# Patient Record
Sex: Female | Born: 1965
Health system: Southern US, Community
[De-identification: ages and names within clinical notes are randomized; demographics above are authoritative.]

## PROBLEM LIST (undated history)

## (undated) DIAGNOSIS — N189 Chronic kidney disease, unspecified: Secondary | ICD-10-CM

## (undated) DIAGNOSIS — R112 Nausea with vomiting, unspecified: Secondary | ICD-10-CM

## (undated) DIAGNOSIS — D649 Anemia, unspecified: Secondary | ICD-10-CM

## (undated) DIAGNOSIS — I272 Pulmonary hypertension, unspecified: Secondary | ICD-10-CM

## (undated) DIAGNOSIS — I10 Essential (primary) hypertension: Secondary | ICD-10-CM

## (undated) DIAGNOSIS — Z9889 Other specified postprocedural states: Secondary | ICD-10-CM

## (undated) DIAGNOSIS — F1020 Alcohol dependence, uncomplicated: Secondary | ICD-10-CM

## (undated) DIAGNOSIS — IMO0002 Reserved for concepts with insufficient information to code with codable children: Secondary | ICD-10-CM

## (undated) DIAGNOSIS — R7881 Bacteremia: Secondary | ICD-10-CM

## (undated) DIAGNOSIS — A419 Sepsis, unspecified organism: Secondary | ICD-10-CM

## (undated) DIAGNOSIS — E1165 Type 2 diabetes mellitus with hyperglycemia: Secondary | ICD-10-CM

## (undated) DIAGNOSIS — H548 Legal blindness, as defined in USA: Secondary | ICD-10-CM

## (undated) DIAGNOSIS — B952 Enterococcus as the cause of diseases classified elsewhere: Secondary | ICD-10-CM

## (undated) DIAGNOSIS — M726 Necrotizing fasciitis: Secondary | ICD-10-CM

## (undated) HISTORY — DX: Essential (primary) hypertension: I10

## (undated) HISTORY — PX: CATARACT EXTRACTION: SUR2

## (undated) HISTORY — PX: APPENDECTOMY: SHX54

## (undated) HISTORY — DX: Anemia, unspecified: D64.9

## (undated) HISTORY — DX: Enterococcus as the cause of diseases classified elsewhere: R78.81

## (undated) HISTORY — DX: Type 2 diabetes mellitus with hyperglycemia: E11.65

## (undated) HISTORY — DX: Alcohol dependence, uncomplicated: F10.20

## (undated) HISTORY — DX: Reserved for concepts with insufficient information to code with codable children: IMO0002

## (undated) HISTORY — DX: Sepsis, unspecified organism: A41.9

## (undated) HISTORY — DX: Enterococcus as the cause of diseases classified elsewhere: B95.2

## (undated) HISTORY — DX: Pulmonary hypertension, unspecified: I27.20

## (undated) HISTORY — PX: TUBAL LIGATION: SHX77

---

## 2004-01-10 ENCOUNTER — Emergency Department (HOSPITAL_COMMUNITY): Admission: EM | Admit: 2004-01-10 | Discharge: 2004-01-10 | Payer: Self-pay | Admitting: Emergency Medicine

## 2004-01-16 ENCOUNTER — Encounter: Admission: RE | Admit: 2004-01-16 | Discharge: 2004-01-16 | Payer: Self-pay | Admitting: Internal Medicine

## 2004-02-04 ENCOUNTER — Encounter: Admission: RE | Admit: 2004-02-04 | Discharge: 2004-02-04 | Payer: Self-pay | Admitting: Internal Medicine

## 2004-09-21 ENCOUNTER — Ambulatory Visit: Payer: Self-pay | Admitting: Internal Medicine

## 2005-01-19 ENCOUNTER — Emergency Department (HOSPITAL_COMMUNITY): Admission: EM | Admit: 2005-01-19 | Discharge: 2005-01-19 | Payer: Self-pay | Admitting: Emergency Medicine

## 2010-06-29 ENCOUNTER — Emergency Department (HOSPITAL_COMMUNITY): Admission: EM | Admit: 2010-06-29 | Discharge: 2010-06-29 | Payer: Self-pay | Admitting: Emergency Medicine

## 2010-06-29 ENCOUNTER — Emergency Department (HOSPITAL_COMMUNITY): Admission: EM | Admit: 2010-06-29 | Discharge: 2010-06-29 | Payer: Self-pay | Admitting: Family Medicine

## 2011-02-20 LAB — POCT I-STAT, CHEM 8
BUN: 7 mg/dL (ref 6–23)
BUN: 8 mg/dL (ref 6–23)
Calcium, Ion: 0.96 mmol/L — ABNORMAL LOW (ref 1.12–1.32)
Calcium, Ion: 1.16 mmol/L (ref 1.12–1.32)
Chloride: 101 mEq/L (ref 96–112)
Chloride: 105 mEq/L (ref 96–112)
Creatinine, Ser: 0.7 mg/dL (ref 0.4–1.2)
Creatinine, Ser: 0.7 mg/dL (ref 0.4–1.2)
Glucose, Bld: 337 mg/dL — ABNORMAL HIGH (ref 70–99)
Glucose, Bld: 460 mg/dL — ABNORMAL HIGH (ref 70–99)
HCT: 42 % (ref 36.0–46.0)
HCT: 43 % (ref 36.0–46.0)
Hemoglobin: 14.3 g/dL (ref 12.0–15.0)
Hemoglobin: 14.6 g/dL (ref 12.0–15.0)
Potassium: 4.2 mEq/L (ref 3.5–5.1)
Potassium: 4.6 mEq/L (ref 3.5–5.1)
Sodium: 136 mEq/L (ref 135–145)
Sodium: 137 mEq/L (ref 135–145)
TCO2: 24 mmol/L (ref 0–100)
TCO2: 26 mmol/L (ref 0–100)

## 2011-02-20 LAB — GLUCOSE, CAPILLARY
Glucose-Capillary: 232 mg/dL — ABNORMAL HIGH (ref 70–99)
Glucose-Capillary: 261 mg/dL — ABNORMAL HIGH (ref 70–99)
Glucose-Capillary: 265 mg/dL — ABNORMAL HIGH (ref 70–99)
Glucose-Capillary: 307 mg/dL — ABNORMAL HIGH (ref 70–99)
Glucose-Capillary: 394 mg/dL — ABNORMAL HIGH (ref 70–99)

## 2011-08-17 ENCOUNTER — Inpatient Hospital Stay (HOSPITAL_COMMUNITY)
Admission: EM | Admit: 2011-08-17 | Discharge: 2011-08-28 | DRG: 629 | Disposition: A | Discharge status: Home / Self Care | Payer: Self-pay | Attending: Orthopedic Surgery | Admitting: Orthopedic Surgery

## 2011-08-17 ENCOUNTER — Inpatient Hospital Stay (INDEPENDENT_AMBULATORY_CARE_PROVIDER_SITE_OTHER)
Admission: RE | Admit: 2011-08-17 | Discharge: 2011-08-17 | Disposition: A | Discharge status: Home / Self Care | Payer: Self-pay | Source: Ambulatory Visit | Attending: Family Medicine | Admitting: Family Medicine

## 2011-08-17 ENCOUNTER — Emergency Department (HOSPITAL_COMMUNITY): Payer: Self-pay

## 2011-08-17 DIAGNOSIS — D509 Iron deficiency anemia, unspecified: Secondary | ICD-10-CM | POA: Diagnosis present

## 2011-08-17 DIAGNOSIS — M9080 Osteopathy in diseases classified elsewhere, unspecified site: Secondary | ICD-10-CM | POA: Diagnosis present

## 2011-08-17 DIAGNOSIS — L608 Other nail disorders: Secondary | ICD-10-CM | POA: Diagnosis present

## 2011-08-17 DIAGNOSIS — S61209A Unspecified open wound of unspecified finger without damage to nail, initial encounter: Secondary | ICD-10-CM | POA: Diagnosis present

## 2011-08-17 DIAGNOSIS — M65849 Other synovitis and tenosynovitis, unspecified hand: Secondary | ICD-10-CM | POA: Diagnosis present

## 2011-08-17 DIAGNOSIS — F1020 Alcohol dependence, uncomplicated: Secondary | ICD-10-CM | POA: Diagnosis present

## 2011-08-17 DIAGNOSIS — E1165 Type 2 diabetes mellitus with hyperglycemia: Principal | ICD-10-CM | POA: Diagnosis present

## 2011-08-17 DIAGNOSIS — Z794 Long term (current) use of insulin: Secondary | ICD-10-CM

## 2011-08-17 DIAGNOSIS — IMO0002 Reserved for concepts with insufficient information to code with codable children: Principal | ICD-10-CM | POA: Diagnosis present

## 2011-08-17 DIAGNOSIS — L03119 Cellulitis of unspecified part of limb: Secondary | ICD-10-CM | POA: Diagnosis present

## 2011-08-17 DIAGNOSIS — N179 Acute kidney failure, unspecified: Secondary | ICD-10-CM | POA: Diagnosis not present

## 2011-08-17 DIAGNOSIS — B372 Candidiasis of skin and nail: Secondary | ICD-10-CM | POA: Diagnosis present

## 2011-08-17 DIAGNOSIS — M65839 Other synovitis and tenosynovitis, unspecified forearm: Secondary | ICD-10-CM | POA: Diagnosis present

## 2011-08-17 DIAGNOSIS — L02519 Cutaneous abscess of unspecified hand: Secondary | ICD-10-CM

## 2011-08-17 DIAGNOSIS — E871 Hypo-osmolality and hyponatremia: Secondary | ICD-10-CM | POA: Diagnosis present

## 2011-08-17 DIAGNOSIS — M869 Osteomyelitis, unspecified: Secondary | ICD-10-CM | POA: Diagnosis present

## 2011-08-17 DIAGNOSIS — T368X5A Adverse effect of other systemic antibiotics, initial encounter: Secondary | ICD-10-CM | POA: Diagnosis not present

## 2011-08-17 DIAGNOSIS — X58XXXA Exposure to other specified factors, initial encounter: Secondary | ICD-10-CM

## 2011-08-17 LAB — POCT I-STAT, CHEM 8
BUN: 4 mg/dL — ABNORMAL LOW (ref 6–23)
Calcium, Ion: 1.02 mmol/L — ABNORMAL LOW (ref 1.12–1.32)
Chloride: 94 mEq/L — ABNORMAL LOW (ref 96–112)
Creatinine, Ser: 0.8 mg/dL (ref 0.50–1.10)
Glucose, Bld: 449 mg/dL — ABNORMAL HIGH (ref 70–99)
HCT: 35 % — ABNORMAL LOW (ref 36.0–46.0)
Hemoglobin: 11.9 g/dL — ABNORMAL LOW (ref 12.0–15.0)
Potassium: 4.5 mEq/L (ref 3.5–5.1)
Sodium: 127 mEq/L — ABNORMAL LOW (ref 135–145)
TCO2: 19 mmol/L (ref 0–100)

## 2011-08-17 LAB — CBC
HCT: 29.1 % — ABNORMAL LOW (ref 36.0–46.0)
Hemoglobin: 9.5 g/dL — ABNORMAL LOW (ref 12.0–15.0)
MCH: 23.8 pg — ABNORMAL LOW (ref 26.0–34.0)
MCHC: 32.6 g/dL (ref 30.0–36.0)
MCV: 72.8 fL — ABNORMAL LOW (ref 78.0–100.0)
Platelets: 460 10*3/uL — ABNORMAL HIGH (ref 150–400)
RBC: 4 MIL/uL (ref 3.87–5.11)
RDW: 13.1 % (ref 11.5–15.5)
WBC: 17.5 10*3/uL — ABNORMAL HIGH (ref 4.0–10.5)

## 2011-08-17 LAB — DIFFERENTIAL
Basophils Absolute: 0 10*3/uL (ref 0.0–0.1)
Basophils Relative: 0 % (ref 0–1)
Eosinophils Absolute: 0.2 10*3/uL (ref 0.0–0.7)
Eosinophils Relative: 1 % (ref 0–5)
Lymphocytes Relative: 7 % — ABNORMAL LOW (ref 12–46)
Lymphs Abs: 1.2 10*3/uL (ref 0.7–4.0)
Monocytes Absolute: 1.2 10*3/uL — ABNORMAL HIGH (ref 0.1–1.0)
Monocytes Relative: 7 % (ref 3–12)
Neutro Abs: 14.9 10*3/uL — ABNORMAL HIGH (ref 1.7–7.7)
Neutrophils Relative %: 85 % — ABNORMAL HIGH (ref 43–77)

## 2011-08-17 LAB — GLUCOSE, CAPILLARY: Glucose-Capillary: 272 mg/dL — ABNORMAL HIGH (ref 70–99)

## 2011-08-18 ENCOUNTER — Inpatient Hospital Stay (HOSPITAL_COMMUNITY): Payer: Self-pay

## 2011-08-18 LAB — GLUCOSE, CAPILLARY
Glucose-Capillary: 221 mg/dL — ABNORMAL HIGH (ref 70–99)
Glucose-Capillary: 221 mg/dL — ABNORMAL HIGH (ref 70–99)
Glucose-Capillary: 267 mg/dL — ABNORMAL HIGH (ref 70–99)
Glucose-Capillary: 271 mg/dL — ABNORMAL HIGH (ref 70–99)
Glucose-Capillary: 331 mg/dL — ABNORMAL HIGH (ref 70–99)
Glucose-Capillary: 344 mg/dL — ABNORMAL HIGH (ref 70–99)
Glucose-Capillary: 350 mg/dL — ABNORMAL HIGH (ref 70–99)

## 2011-08-19 LAB — CBC
HCT: 26.8 % — ABNORMAL LOW (ref 36.0–46.0)
Hemoglobin: 8.5 g/dL — ABNORMAL LOW (ref 12.0–15.0)
MCH: 23.4 pg — ABNORMAL LOW (ref 26.0–34.0)
MCHC: 31.7 g/dL (ref 30.0–36.0)
MCV: 73.8 fL — ABNORMAL LOW (ref 78.0–100.0)
Platelets: 449 10*3/uL — ABNORMAL HIGH (ref 150–400)
RBC: 3.63 MIL/uL — ABNORMAL LOW (ref 3.87–5.11)
RDW: 13.4 % (ref 11.5–15.5)
WBC: 12.8 10*3/uL — ABNORMAL HIGH (ref 4.0–10.5)

## 2011-08-19 LAB — DIFFERENTIAL
Basophils Absolute: 0 10*3/uL (ref 0.0–0.1)
Basophils Relative: 0 % (ref 0–1)
Eosinophils Absolute: 0.1 10*3/uL (ref 0.0–0.7)
Eosinophils Relative: 1 % (ref 0–5)
Lymphocytes Relative: 7 % — ABNORMAL LOW (ref 12–46)
Lymphs Abs: 0.9 10*3/uL (ref 0.7–4.0)
Monocytes Absolute: 0.8 10*3/uL (ref 0.1–1.0)
Monocytes Relative: 6 % (ref 3–12)
Neutro Abs: 11 10*3/uL — ABNORMAL HIGH (ref 1.7–7.7)
Neutrophils Relative %: 86 % — ABNORMAL HIGH (ref 43–77)

## 2011-08-19 LAB — BASIC METABOLIC PANEL
BUN: 18 mg/dL (ref 6–23)
CO2: 23 mEq/L (ref 19–32)
Calcium: 8.3 mg/dL — ABNORMAL LOW (ref 8.4–10.5)
Chloride: 97 mEq/L (ref 96–112)
Creatinine, Ser: 2.81 mg/dL — ABNORMAL HIGH (ref 0.50–1.10)
GFR calc Af Amer: 22 mL/min — ABNORMAL LOW (ref >60)
GFR calc non Af Amer: 18 mL/min — ABNORMAL LOW (ref >60)
Glucose, Bld: 204 mg/dL — ABNORMAL HIGH (ref 70–99)
Potassium: 5.1 mEq/L (ref 3.5–5.1)
Sodium: 132 mEq/L — ABNORMAL LOW (ref 135–145)

## 2011-08-19 LAB — IRON AND TIBC
Iron: 11 ug/dL — ABNORMAL LOW (ref 42–135)
Saturation Ratios: 5 % — ABNORMAL LOW (ref 20–55)
TIBC: 228 ug/dL — ABNORMAL LOW (ref 250–470)
UIBC: 217 ug/dL (ref 125–400)

## 2011-08-19 LAB — GLUCOSE, CAPILLARY
Glucose-Capillary: 184 mg/dL — ABNORMAL HIGH (ref 70–99)
Glucose-Capillary: 199 mg/dL — ABNORMAL HIGH (ref 70–99)
Glucose-Capillary: 206 mg/dL — ABNORMAL HIGH (ref 70–99)
Glucose-Capillary: 208 mg/dL — ABNORMAL HIGH (ref 70–99)
Glucose-Capillary: 220 mg/dL — ABNORMAL HIGH (ref 70–99)
Glucose-Capillary: 318 mg/dL — ABNORMAL HIGH (ref 70–99)
Glucose-Capillary: 335 mg/dL — ABNORMAL HIGH (ref 70–99)

## 2011-08-19 LAB — HEMOGLOBIN A1C
Hgb A1c MFr Bld: 14 % — ABNORMAL HIGH (ref <5.7)
Hgb A1c MFr Bld: 13.9 % — ABNORMAL HIGH (ref <5.7)
Mean Plasma Glucose: 355 mg/dL — ABNORMAL HIGH (ref <117)
Mean Plasma Glucose: 352 mg/dL — ABNORMAL HIGH (ref <117)

## 2011-08-19 LAB — FOLATE: Folate: 10.2 ng/mL

## 2011-08-19 LAB — FERRITIN: Ferritin: 215 ng/mL (ref 10–291)

## 2011-08-19 LAB — VANCOMYCIN, TROUGH: Vancomycin Tr: 74.7 ug/mL (ref 10.0–20.0)

## 2011-08-19 LAB — SURGICAL PCR SCREEN
MRSA, PCR: NEGATIVE
Staphylococcus aureus: POSITIVE — AB

## 2011-08-19 LAB — VITAMIN B12: Vitamin B-12: 439 pg/mL (ref 211–911)

## 2011-08-20 ENCOUNTER — Inpatient Hospital Stay (HOSPITAL_COMMUNITY): Payer: Self-pay

## 2011-08-20 ENCOUNTER — Other Ambulatory Visit (HOSPITAL_COMMUNITY): Payer: Self-pay

## 2011-08-20 LAB — GLUCOSE, CAPILLARY
Glucose-Capillary: 107 mg/dL — ABNORMAL HIGH (ref 70–99)
Glucose-Capillary: 125 mg/dL — ABNORMAL HIGH (ref 70–99)
Glucose-Capillary: 144 mg/dL — ABNORMAL HIGH (ref 70–99)
Glucose-Capillary: 147 mg/dL — ABNORMAL HIGH (ref 70–99)
Glucose-Capillary: 157 mg/dL — ABNORMAL HIGH (ref 70–99)
Glucose-Capillary: 181 mg/dL — ABNORMAL HIGH (ref 70–99)

## 2011-08-20 LAB — BASIC METABOLIC PANEL
BUN: 24 mg/dL — ABNORMAL HIGH (ref 6–23)
CO2: 22 mEq/L (ref 19–32)
Calcium: 7.5 mg/dL — ABNORMAL LOW (ref 8.4–10.5)
Chloride: 98 mEq/L (ref 96–112)
Creatinine, Ser: 3.91 mg/dL — ABNORMAL HIGH (ref 0.50–1.10)
GFR calc Af Amer: 15 mL/min — ABNORMAL LOW (ref >60)
GFR calc non Af Amer: 13 mL/min — ABNORMAL LOW (ref >60)
Glucose, Bld: 143 mg/dL — ABNORMAL HIGH (ref 70–99)
Potassium: 5.2 mEq/L — ABNORMAL HIGH (ref 3.5–5.1)
Sodium: 133 mEq/L — ABNORMAL LOW (ref 135–145)

## 2011-08-20 LAB — DIFFERENTIAL
Basophils Absolute: 0 10*3/uL (ref 0.0–0.1)
Basophils Relative: 0 % (ref 0–1)
Eosinophils Absolute: 0.2 10*3/uL (ref 0.0–0.7)
Eosinophils Relative: 2 % (ref 0–5)
Lymphocytes Relative: 11 % — ABNORMAL LOW (ref 12–46)
Lymphs Abs: 1.3 10*3/uL (ref 0.7–4.0)
Monocytes Absolute: 1.1 10*3/uL — ABNORMAL HIGH (ref 0.1–1.0)
Monocytes Relative: 10 % (ref 3–12)
Neutro Abs: 8.7 10*3/uL — ABNORMAL HIGH (ref 1.7–7.7)
Neutrophils Relative %: 77 % (ref 43–77)

## 2011-08-20 LAB — CBC
HCT: 23.9 % — ABNORMAL LOW (ref 36.0–46.0)
Hemoglobin: 7.4 g/dL — ABNORMAL LOW (ref 12.0–15.0)
MCH: 22.8 pg — ABNORMAL LOW (ref 26.0–34.0)
MCHC: 31 g/dL (ref 30.0–36.0)
MCV: 73.8 fL — ABNORMAL LOW (ref 78.0–100.0)
Platelets: 401 10*3/uL — ABNORMAL HIGH (ref 150–400)
RBC: 3.24 MIL/uL — ABNORMAL LOW (ref 3.87–5.11)
RDW: 13.7 % (ref 11.5–15.5)
WBC: 11.3 10*3/uL — ABNORMAL HIGH (ref 4.0–10.5)

## 2011-08-20 LAB — WOUND CULTURE

## 2011-08-20 LAB — TSH: TSH: 2.863 u[IU]/mL (ref 0.350–4.500)

## 2011-08-20 LAB — ABO/RH: ABO/RH(D): O NEG

## 2011-08-21 LAB — DIFFERENTIAL
Basophils Absolute: 0.1 10*3/uL (ref 0.0–0.1)
Basophils Relative: 1 % (ref 0–1)
Eosinophils Absolute: 0.3 10*3/uL (ref 0.0–0.7)
Eosinophils Relative: 3 % (ref 0–5)
Lymphocytes Relative: 14 % (ref 12–46)
Lymphs Abs: 1.4 10*3/uL (ref 0.7–4.0)
Monocytes Absolute: 0.9 10*3/uL (ref 0.1–1.0)
Monocytes Relative: 9 % (ref 3–12)
Neutro Abs: 7 10*3/uL (ref 1.7–7.7)
Neutrophils Relative %: 73 % (ref 43–77)

## 2011-08-21 LAB — GLUCOSE, CAPILLARY
Glucose-Capillary: 105 mg/dL — ABNORMAL HIGH (ref 70–99)
Glucose-Capillary: 152 mg/dL — ABNORMAL HIGH (ref 70–99)
Glucose-Capillary: 159 mg/dL — ABNORMAL HIGH (ref 70–99)
Glucose-Capillary: 177 mg/dL — ABNORMAL HIGH (ref 70–99)
Glucose-Capillary: 94 mg/dL (ref 70–99)

## 2011-08-21 LAB — BASIC METABOLIC PANEL
BUN: 29 mg/dL — ABNORMAL HIGH (ref 6–23)
CO2: 23 mEq/L (ref 19–32)
Calcium: 7.5 mg/dL — ABNORMAL LOW (ref 8.4–10.5)
Chloride: 100 mEq/L (ref 96–112)
Creatinine, Ser: 4.79 mg/dL — ABNORMAL HIGH (ref 0.50–1.10)
GFR calc Af Amer: 12 mL/min — ABNORMAL LOW (ref >60)
GFR calc non Af Amer: 10 mL/min — ABNORMAL LOW (ref >60)
Glucose, Bld: 86 mg/dL (ref 70–99)
Potassium: 4.5 mEq/L (ref 3.5–5.1)
Sodium: 135 mEq/L (ref 135–145)

## 2011-08-21 LAB — VANCOMYCIN, TROUGH: Vancomycin Tr: 48.6 ug/mL (ref 10.0–20.0)

## 2011-08-21 LAB — CBC
HCT: 26.1 % — ABNORMAL LOW (ref 36.0–46.0)
Hemoglobin: 8.4 g/dL — ABNORMAL LOW (ref 12.0–15.0)
MCH: 24 pg — ABNORMAL LOW (ref 26.0–34.0)
MCHC: 32.2 g/dL (ref 30.0–36.0)
MCV: 74.6 fL — ABNORMAL LOW (ref 78.0–100.0)
Platelets: 420 10*3/uL — ABNORMAL HIGH (ref 150–400)
RBC: 3.5 MIL/uL — ABNORMAL LOW (ref 3.87–5.11)
RDW: 14.3 % (ref 11.5–15.5)
WBC: 9.7 10*3/uL (ref 4.0–10.5)

## 2011-08-21 LAB — TISSUE CULTURE

## 2011-08-21 LAB — CROSSMATCH
ABO/RH(D): O NEG
Antibody Screen: NEGATIVE
Unit division: 0
Weak D: NEGATIVE

## 2011-08-22 LAB — GLUCOSE, CAPILLARY
Glucose-Capillary: 142 mg/dL — ABNORMAL HIGH (ref 70–99)
Glucose-Capillary: 124 mg/dL — ABNORMAL HIGH (ref 70–99)
Glucose-Capillary: 175 mg/dL — ABNORMAL HIGH (ref 70–99)
Glucose-Capillary: 175 mg/dL — ABNORMAL HIGH (ref 70–99)
Glucose-Capillary: 159 mg/dL — ABNORMAL HIGH (ref 70–99)

## 2011-08-22 LAB — BASIC METABOLIC PANEL
BUN: 32 mg/dL — ABNORMAL HIGH (ref 6–23)
CO2: 21 mEq/L (ref 19–32)
Calcium: 7.3 mg/dL — ABNORMAL LOW (ref 8.4–10.5)
Chloride: 99 mEq/L (ref 96–112)
Creatinine, Ser: 4.89 mg/dL — ABNORMAL HIGH (ref 0.50–1.10)
GFR calc Af Amer: 12 mL/min — ABNORMAL LOW (ref >60)
GFR calc non Af Amer: 10 mL/min — ABNORMAL LOW (ref >60)
Glucose, Bld: 115 mg/dL — ABNORMAL HIGH (ref 70–99)
Potassium: 4.4 mEq/L (ref 3.5–5.1)
Sodium: 132 mEq/L — ABNORMAL LOW (ref 135–145)

## 2011-08-22 LAB — ANAEROBIC CULTURE

## 2011-08-22 LAB — CBC
HCT: 28.1 % — ABNORMAL LOW (ref 36.0–46.0)
Hemoglobin: 8.9 g/dL — ABNORMAL LOW (ref 12.0–15.0)
MCH: 23.7 pg — ABNORMAL LOW (ref 26.0–34.0)
MCHC: 31.7 g/dL (ref 30.0–36.0)
MCV: 74.7 fL — ABNORMAL LOW (ref 78.0–100.0)
Platelets: 447 10*3/uL — ABNORMAL HIGH (ref 150–400)
RBC: 3.76 MIL/uL — ABNORMAL LOW (ref 3.87–5.11)
RDW: 14.4 % (ref 11.5–15.5)
WBC: 10.2 10*3/uL (ref 4.0–10.5)

## 2011-08-22 LAB — DIFFERENTIAL
Basophils Absolute: 0 10*3/uL (ref 0.0–0.1)
Basophils Relative: 0 % (ref 0–1)
Eosinophils Absolute: 0.3 10*3/uL (ref 0.0–0.7)
Eosinophils Relative: 3 % (ref 0–5)
Lymphocytes Relative: 13 % (ref 12–46)
Lymphs Abs: 1.3 10*3/uL (ref 0.7–4.0)
Monocytes Absolute: 1 10*3/uL (ref 0.1–1.0)
Monocytes Relative: 10 % (ref 3–12)
Neutro Abs: 7.6 10*3/uL (ref 1.7–7.7)
Neutrophils Relative %: 74 % (ref 43–77)

## 2011-08-22 LAB — PROTIME-INR
INR: 1.1 (ref 0.00–1.49)
Prothrombin Time: 14.4 seconds (ref 11.6–15.2)

## 2011-08-22 LAB — HEPATITIS B SURFACE ANTIGEN: Hepatitis B Surface Ag: NEGATIVE

## 2011-08-23 LAB — CBC
HCT: 27.6 % — ABNORMAL LOW (ref 36.0–46.0)
Hemoglobin: 8.7 g/dL — ABNORMAL LOW (ref 12.0–15.0)
MCH: 23.6 pg — ABNORMAL LOW (ref 26.0–34.0)
MCHC: 31.5 g/dL (ref 30.0–36.0)
MCV: 75 fL — ABNORMAL LOW (ref 78.0–100.0)
Platelets: 473 10*3/uL — ABNORMAL HIGH (ref 150–400)
RBC: 3.68 MIL/uL — ABNORMAL LOW (ref 3.87–5.11)
RDW: 14.6 % (ref 11.5–15.5)
WBC: 9.9 10*3/uL (ref 4.0–10.5)

## 2011-08-23 LAB — BASIC METABOLIC PANEL
BUN: 33 mg/dL — ABNORMAL HIGH (ref 6–23)
CO2: 19 mEq/L (ref 19–32)
Calcium: 7 mg/dL — ABNORMAL LOW (ref 8.4–10.5)
Chloride: 101 mEq/L (ref 96–112)
Creatinine, Ser: 4.83 mg/dL — ABNORMAL HIGH (ref 0.50–1.10)
GFR calc Af Amer: 12 mL/min — ABNORMAL LOW (ref >60)
GFR calc non Af Amer: 10 mL/min — ABNORMAL LOW (ref >60)
Glucose, Bld: 108 mg/dL — ABNORMAL HIGH (ref 70–99)
Potassium: 5 mEq/L (ref 3.5–5.1)
Sodium: 133 mEq/L — ABNORMAL LOW (ref 135–145)

## 2011-08-23 LAB — DIFFERENTIAL
Basophils Absolute: 0.1 10*3/uL (ref 0.0–0.1)
Basophils Relative: 1 % (ref 0–1)
Eosinophils Absolute: 0.3 10*3/uL (ref 0.0–0.7)
Eosinophils Relative: 3 % (ref 0–5)
Lymphocytes Relative: 12 % (ref 12–46)
Lymphs Abs: 1.2 10*3/uL (ref 0.7–4.0)
Monocytes Absolute: 1 10*3/uL (ref 0.1–1.0)
Monocytes Relative: 10 % (ref 3–12)
Neutro Abs: 7.3 10*3/uL (ref 1.7–7.7)
Neutrophils Relative %: 74 % (ref 43–77)

## 2011-08-23 LAB — GLUCOSE, CAPILLARY
Glucose-Capillary: 100 mg/dL — ABNORMAL HIGH (ref 70–99)
Glucose-Capillary: 124 mg/dL — ABNORMAL HIGH (ref 70–99)
Glucose-Capillary: 152 mg/dL — ABNORMAL HIGH (ref 70–99)
Glucose-Capillary: 161 mg/dL — ABNORMAL HIGH (ref 70–99)
Glucose-Capillary: 170 mg/dL — ABNORMAL HIGH (ref 70–99)

## 2011-08-23 NOTE — Consult Note (Signed)
[name], DIAB NO.:  [id number]  MEDICAL RECORD NO.:  [id number]  LOCATION:  [id number]                         FACILITY:  [facility]  PHYSICIAN:  Sol Blazing, M.D.DATE OF BIRTH:  1966/10/04  DATE OF CONSULTATION:  08/21/2011 DATE OF DISCHARGE:                                CONSULTATION   REQUESTING PHYSICIAN:  Satira Anis. Amedeo Plenty, MD  REASON FOR CONSULT:  Acute renal failure.  HISTORY:  The patient is a 45 year old African American female with a 5- year history diabetes who presented with a 2-week history of worsening hand and finger infection on the right hand, also to a lesser extent of the left hand.  She was admitted on August 17, 2011, by Dr. Amedeo Plenty who did an I and D of a right mid-palmar abscess and amputation right index finger which was grossly infected throughout.  She also had a partial distal amputation of the left third finger which was also affected. Postop, she has been stable clinically.  She is a heavy drinker, half a case of beer reportedly a day per family, but has not shown a lot of signs of withdrawal.  She has developed acute renal failure with a steadily rising creatinine over the last 3 days, with associated high vancomycin levels.  Creatinine on admission was 0.8.  On August 19, 2011, it was 2.8, up to 3.9 yesterday on August 20, 2011, and 4.7 today on August 21, 2011.  She is making urine and denies any urinary complaints.  She was initially treated with vancomycin and Zosyn from admission up to August 19, 2011, and vancomycin was discontinued when the elevated creatinine was noticed. Vancomycin level on August 19, 2011, was 64 and on August 20, 2011, down to 48.  She was switched yesterday to Keflex and doxycycline p.o. and the Zosyn has been stopped.  She denies any history of renal failure or kidney disease in the family.  PAST MEDICAL HISTORY: 1. Diabetes mellitus type 2. 2. C-section. 3. Alcohol  abuse.  PAST SURGICAL HISTORY:  C-section.  MEDICATIONS:  She is on vitamins, sliding scale insulin, ferrous sulfate, doxycycline 100 mg b.i.d., cephalexin 500 mg q.8 and p.r.n. medications.  ALLERGIES:  None.  SOCIAL HISTORY:  The patient is single.  She has 3 children, she has one at home, the youngest is 42.  No tobacco or drug use.  Alcohol as above.  FAMILY HISTORY:  Father is alive with diabetes.  Mother died at age 57, she had diabetes.  REVIEW OF SYSTEMS:  Denies any active fever, chills, sweats, sore throat, difficulty swallowing, chest pain, productive cough, shortness of breath, abdominal pain, nausea, vomiting, or diarrhea.  Appetite is decent and improving.  No difficulty voiding.  She does not have a Foley catheter in place.  Denies any dysuria or change in urine color.  No joint pain.  No history of arthritis.  No focal numbness or weakness. No history of heart disease.  No history of stroke or seizure.  Denies skin rash or itching.  PHYSICAL EXAMINATION:  VITAL SIGNS:  Blood pressure 130/70, temp 98, pulse 80, respirations 18, and O2 sat 92% on room air. GENERAL:  The patient is  a well-nourished appearing, moderately adult female in no distress.  She is sitting up in bed. SKIN:  Warm and dry.  She has dark discoloration of multiple areas on her lower extremities without any abscess formation. HEENT:  PERRLA, EOMI.  Throat is clear and moist. NECK:  Supple with flat neck veins.  No JVD or bruits. CHEST:  Clear throughout.  No rales, rhonchi, or wheezing. CARDIAC:  Regular rate and rhythm with quiet precordium.  No murmur, rub, or gallop.  No heaves or lifts. ABDOMEN:  Moderately obese, soft, nontender, active bowel sounds.  Well- healed suprapubic scar from previous C-section.  No ascites. RECTAL:  Deferred. GU:  Deferred. EXTREMITIES:  No joint effusion or deformity.  Both hands are wrapped up in bandage with bandages.  There is no other edema in the upper or  lower extremities.  No calf cords or tenderness.  Pulses in the feet are diminished. NEUROLOGIC:  Awake, alert, and oriented x3.  No focal deficits.  LABORATORY DATA:  Sodium 135, potassium 4, BUN 29, creatinine 4.79, and CO2 of 23.  Hemoglobin 8.4, white blood count 9000, and platelets 420. Wound cultures was positive for group B strep on August 17, 2011. Blood cultures were negative x2.  Vancomycin level is elevated as above. Urinary sediment showed numerous granular casts, 5-10 WBC's and RBC's, but no cellular casts. Lots of squamous epithelial cells.  IMPRESSION: 1. Acute tubular necrosis due to vancomycin toxicity, nonoliguric.     The patient is euvolemic.  Electrolytes are normal at this time.     Recommend conservative management at this time.  No need for acute     dialysis.  We will start IV fluids and hopefully she     will recover soon.  If necessary, temporary dialysis will     be done.  Vancomycin has already been stopped.  In addition, we     would measure urine sodium, send another urinalysis.       Stop Robaxin which is contraindicated in advanced     renal failure and avoid any other nephrotoxins, ACE inhibitors,     etc. 2. Right hand infection, status post I$D and finger amputations. 3. Diabetes mellitus type 2, poorly controlled, long term. 4. Alcohol abuse.  RECOMMENDATIONS:  See orders.     Sol Blazing, M.D.     RDS/MEDQ  D:  08/21/2011  T:  08/21/2011  Job:  [id number]  Electronically Signed by Roney Jaffe M.D. on 08/23/2011 07:48:39 AM

## 2011-08-23 NOTE — Consult Note (Signed)
[name], [name] NO.:  [id number]  MEDICAL RECORD NO.:  [id number]  LOCATION:  [id number]                         FACILITY:  [facility]  PHYSICIAN:  Loma Boston, DO      DATE OF BIRTH:  Oct 31, 1966  DATE OF CONSULTATION: DATE OF DISCHARGE:                                CONSULTATION   REQUESTING PHYSICIAN:  Satira Anis. Amedeo Plenty, MD of [facility].  REASON FOR CONSULTATION:  Management of hypertension, diabetes, alcohol withdrawal, and hyponatremia.  HISTORY OF PRESENT ILLNESS:  [name] is a 45 year old African American female with a history of diabetes and hypertension.  She presented to the emergency department with a 3-4 month history of right finger infections that have become uncontrolled.  The patient went to the operating room for incision and drainage and debridement of the wounds and ended up with a guillotine amputation of the right index finger by Dr. Amedeo Plenty.  In the emergency department, the patient was noted to have glucose of 449 as well as blood pressures have been elevated.  The patient had metformin in the past, but has not been on this for quite sometime due to difficult financial position.  She also stated that she was prescribed hydrochlorothiazide at some point, but never took the medication.  The patient has carried the diagnosis of diabetes for past 4-5 years.  She admits to blurred vision, polyuria, polydipsia.  She denies headaches, fevers, shaking chills, nausea, vomiting.  The patient does have multiple wounds on the lower extremities as well as fungal toenail infections.  The patient denies decreased sensation in the lower extremities bilaterally.  PAST MEDICAL HISTORY: 1. Diabetes. 2. Hypertension.  MEDICATIONS:  None.  SURGERIES:  Status post right index finger guillotine amputation due to infection.  ALLERGIES:  No known drug allergies.  FAMILY HISTORY:  Significant for hypertension, hypercholesterolemia, diabetes type  2.  SOCIAL HISTORY:  The patient lives with her boyfriend and works at [employer]. She reports taking 40 ounces beer a day, though per the patient's family this has been minimized, and the family reports that the patient drinks a 12-pack a night.  The patient denies tobacco or illicit drug use.  PHYSICAL EXAMINATION:  VITAL SIGNS:  The patient is afebrile with a heart rate of 103, blood pressure is 153/75, oxygen saturation is 99% on room air with a respiratory rate of 21. GENERAL:  This is a middle-aged [place] American female who is awake, alert, and oriented x3. HEENT:  Head normocephalic, atraumatic.  Pupils equal, round, reactive to light.  Extraocular muscles are intact.  Sclerae are anicteric, noninjected. NECK:  Supple without lymphadenopathy.  Oropharynx is moist. LUNGS:  Clear to auscultation bilaterally.  No wheezes, rales, or rhonchi. CARDIAC:  Regular rate with normal S1 and S2 sounds.  No murmurs auscultated. ABDOMEN:  Soft, nontender, nondistended with appropriate bowel sounds. SKIN:  There is multiple superficial wounds on her lower extremities bilaterally that have been treated with topical antiseptic.  The patient has a dressing on the right upper extremity. VASCULAR:  2+ dorsalis pedis and radial pulses.  There is no edema present. NEURO:  Strength is 5/5 in lower extremities bilaterally.  There is no  focal deficit observed.  LABORATORY DATA: 1. An i-STAT was drawn, which showed a sodium of 127, potassium of     4.5, chloride of 94, BUN of 4, creatinine of 0.8.  The patient's     glucose was 449.  After surgery, the patient's blood sugar was in     the low 220s. 2. CBC was drawn, which showed a white count of 17.5.  Hemoglobin was     9.5, hematocrit was 29.1, and platelets of 460, and percent     neutrophil of 85 and ANC 14.9. 3. Chest x-ray showed no evidence of focal consolidation, pleural     effusion, or pneumothorax.  ASSESSMENT AND PLAN: 1. Right hand  infection with status post infection drainage and     guillotine amputation of the right index finger.  The patient is     being admitted to a medicine and surgery floor.  I agree with the     orthopedic team's choice of vancomycin and Zosyn as empiric     coverage.  Wound care and management of this will be per     Orthopedics. 2. Diabetes type 2, uncontrolled.  I recommend checking a hemoglobin     A1c as well as obtaining a TSH.  I have recommended starting the     patient on insulin of 20 units at bedtime as well as Humalog 4     units a.c. and a sliding scale.  I had also recommended checking     the patient's CBGs every 4 hours and then before meals.  She is     also recommended placing the patient on carbohydrate modified diet     endorsing aide in the blood sugar management. 3. Hypertension.  I would recommend starting hydrochlorothiazide 25 mg     daily.  Should this prove insufficient for management of the     patient's hypertension, another agent, such as lisinopril can be     added. 4. Hyponatremia.  I would recommend continuing the patient's IV fluids     of normal saline at 125 mL per hour.  I would recommend rechecking     the patient's basic metabolic panel in the morning to assure that     her electrolytes are normalizing. 5. Alcohol dependence.  I would recommend treating the patient with     the withdrawal protocol starting with thiamine and monitoring     withdrawal symptoms.  Thank you for allowing [place] to participate in the management of this patient's care.  Greater than 50 minutes was spent in preparation of this consult.          ______________________________ Loma Boston, DO     JS/MEDQ  D:  08/17/2011  T:  08/18/2011  Job:  [id number]  Electronically Signed by Loma Boston DO on 08/23/2011 06:36:03 PM

## 2011-08-24 LAB — DIFFERENTIAL
Basophils Absolute: 0 10*3/uL (ref 0.0–0.1)
Basophils Relative: 0 % (ref 0–1)
Eosinophils Absolute: 0.2 10*3/uL (ref 0.0–0.7)
Eosinophils Relative: 3 % (ref 0–5)
Lymphocytes Relative: 11 % — ABNORMAL LOW (ref 12–46)
Lymphs Abs: 1.1 10*3/uL (ref 0.7–4.0)
Monocytes Absolute: 0.8 10*3/uL (ref 0.1–1.0)
Monocytes Relative: 8 % (ref 3–12)
Neutro Abs: 7.5 10*3/uL (ref 1.7–7.7)
Neutrophils Relative %: 78 % — ABNORMAL HIGH (ref 43–77)

## 2011-08-24 LAB — COMPREHENSIVE METABOLIC PANEL
ALT: 5 U/L (ref 0–35)
AST: 17 U/L (ref 0–37)
Albumin: 2 g/dL — ABNORMAL LOW (ref 3.5–5.2)
Alkaline Phosphatase: 100 U/L (ref 39–117)
BUN: 33 mg/dL — ABNORMAL HIGH (ref 6–23)
CO2: 17 mEq/L — ABNORMAL LOW (ref 19–32)
Calcium: 7.7 mg/dL — ABNORMAL LOW (ref 8.4–10.5)
Chloride: 103 mEq/L (ref 96–112)
Creatinine, Ser: 4.8 mg/dL — ABNORMAL HIGH (ref 0.50–1.10)
GFR calc Af Amer: 12 mL/min — ABNORMAL LOW (ref >60)
GFR calc non Af Amer: 10 mL/min — ABNORMAL LOW (ref >60)
Glucose, Bld: 142 mg/dL — ABNORMAL HIGH (ref 70–99)
Potassium: 5 mEq/L (ref 3.5–5.1)
Sodium: 137 mEq/L (ref 135–145)
Total Bilirubin: 0.2 mg/dL — ABNORMAL LOW (ref 0.3–1.2)
Total Protein: 7 g/dL (ref 6.0–8.3)

## 2011-08-24 LAB — CBC
HCT: 29.8 % — ABNORMAL LOW (ref 36.0–46.0)
Hemoglobin: 9.3 g/dL — ABNORMAL LOW (ref 12.0–15.0)
MCH: 23.6 pg — ABNORMAL LOW (ref 26.0–34.0)
MCHC: 31.2 g/dL (ref 30.0–36.0)
MCV: 75.6 fL — ABNORMAL LOW (ref 78.0–100.0)
Platelets: 533 10*3/uL — ABNORMAL HIGH (ref 150–400)
RBC: 3.94 MIL/uL (ref 3.87–5.11)
RDW: 14.9 % (ref 11.5–15.5)
WBC: 9.6 10*3/uL (ref 4.0–10.5)

## 2011-08-24 LAB — CULTURE, BLOOD (ROUTINE X 2)
Culture  Setup Time: 201209120206
Culture  Setup Time: 201209120207
Culture: NO GROWTH
Culture: NO GROWTH

## 2011-08-24 LAB — IRON: Iron: 485 ug/dL — ABNORMAL HIGH (ref 42–135)

## 2011-08-24 LAB — GLUCOSE, CAPILLARY: Glucose-Capillary: 177 mg/dL — ABNORMAL HIGH (ref 70–99)

## 2011-08-25 LAB — GLUCOSE, CAPILLARY
Glucose-Capillary: 119 mg/dL — ABNORMAL HIGH (ref 70–99)
Glucose-Capillary: 132 mg/dL — ABNORMAL HIGH (ref 70–99)
Glucose-Capillary: 136 mg/dL — ABNORMAL HIGH (ref 70–99)
Glucose-Capillary: 139 mg/dL — ABNORMAL HIGH (ref 70–99)
Glucose-Capillary: 145 mg/dL — ABNORMAL HIGH (ref 70–99)
Glucose-Capillary: 151 mg/dL — ABNORMAL HIGH (ref 70–99)
Glucose-Capillary: 177 mg/dL — ABNORMAL HIGH (ref 70–99)
Glucose-Capillary: 188 mg/dL — ABNORMAL HIGH (ref 70–99)
Glucose-Capillary: 201 mg/dL — ABNORMAL HIGH (ref 70–99)
Glucose-Capillary: 209 mg/dL — ABNORMAL HIGH (ref 70–99)
Glucose-Capillary: 75 mg/dL (ref 70–99)
Glucose-Capillary: 94 mg/dL (ref 70–99)

## 2011-08-25 LAB — RENAL FUNCTION PANEL
Albumin: 2.1 g/dL — ABNORMAL LOW (ref 3.5–5.2)
BUN: 32 mg/dL — ABNORMAL HIGH (ref 6–23)
CO2: 22 mEq/L (ref 19–32)
Calcium: 7.9 mg/dL — ABNORMAL LOW (ref 8.4–10.5)
Chloride: 103 mEq/L (ref 96–112)
Creatinine, Ser: 4.95 mg/dL — ABNORMAL HIGH (ref 0.50–1.10)
GFR calc Af Amer: 12 mL/min — ABNORMAL LOW (ref >60)
GFR calc non Af Amer: 10 mL/min — ABNORMAL LOW (ref >60)
Glucose, Bld: 172 mg/dL — ABNORMAL HIGH (ref 70–99)
Phosphorus: 5 mg/dL — ABNORMAL HIGH (ref 2.3–4.6)
Potassium: 4.8 mEq/L (ref 3.5–5.1)
Sodium: 138 mEq/L (ref 135–145)

## 2011-08-25 LAB — DIFFERENTIAL
Basophils Absolute: 0.1 10*3/uL (ref 0.0–0.1)
Basophils Relative: 1 % (ref 0–1)
Eosinophils Absolute: 0.3 10*3/uL (ref 0.0–0.7)
Eosinophils Relative: 3 % (ref 0–5)
Lymphocytes Relative: 11 % — ABNORMAL LOW (ref 12–46)
Lymphs Abs: 1.1 10*3/uL (ref 0.7–4.0)
Monocytes Absolute: 1 10*3/uL (ref 0.1–1.0)
Monocytes Relative: 9 % (ref 3–12)
Neutro Abs: 7.8 10*3/uL — ABNORMAL HIGH (ref 1.7–7.7)
Neutrophils Relative %: 76 % (ref 43–77)

## 2011-08-25 LAB — CBC
HCT: 28.8 % — ABNORMAL LOW (ref 36.0–46.0)
Hemoglobin: 9.1 g/dL — ABNORMAL LOW (ref 12.0–15.0)
MCH: 23.6 pg — ABNORMAL LOW (ref 26.0–34.0)
MCHC: 31.6 g/dL (ref 30.0–36.0)
MCV: 74.6 fL — ABNORMAL LOW (ref 78.0–100.0)
Platelets: 524 10*3/uL — ABNORMAL HIGH (ref 150–400)
RBC: 3.86 MIL/uL — ABNORMAL LOW (ref 3.87–5.11)
RDW: 15.1 % (ref 11.5–15.5)
WBC: 10.2 10*3/uL (ref 4.0–10.5)

## 2011-08-26 LAB — GLUCOSE, CAPILLARY
Glucose-Capillary: 203 mg/dL — ABNORMAL HIGH (ref 70–99)
Glucose-Capillary: 178 mg/dL — ABNORMAL HIGH (ref 70–99)
Glucose-Capillary: 146 mg/dL — ABNORMAL HIGH (ref 70–99)
Glucose-Capillary: 121 mg/dL — ABNORMAL HIGH (ref 70–99)
Glucose-Capillary: 94 mg/dL (ref 70–99)
Glucose-Capillary: 105 mg/dL — ABNORMAL HIGH (ref 70–99)

## 2011-08-26 LAB — DIFFERENTIAL
Basophils Absolute: 0.1 10*3/uL (ref 0.0–0.1)
Basophils Relative: 1 % (ref 0–1)
Eosinophils Absolute: 0.3 10*3/uL (ref 0.0–0.7)
Eosinophils Relative: 3 % (ref 0–5)
Lymphocytes Relative: 18 % (ref 12–46)
Lymphs Abs: 1.9 10*3/uL (ref 0.7–4.0)
Monocytes Absolute: 0.9 10*3/uL (ref 0.1–1.0)
Monocytes Relative: 9 % (ref 3–12)
Neutro Abs: 7.1 10*3/uL (ref 1.7–7.7)
Neutrophils Relative %: 69 % (ref 43–77)

## 2011-08-26 LAB — BASIC METABOLIC PANEL
BUN: 30 mg/dL — ABNORMAL HIGH (ref 6–23)
CO2: 24 mEq/L (ref 19–32)
Calcium: 8.2 mg/dL — ABNORMAL LOW (ref 8.4–10.5)
Chloride: 103 mEq/L (ref 96–112)
Creatinine, Ser: 4.45 mg/dL — ABNORMAL HIGH (ref 0.50–1.10)
GFR calc Af Amer: 13 mL/min — ABNORMAL LOW (ref >60)
GFR calc non Af Amer: 11 mL/min — ABNORMAL LOW (ref >60)
Glucose, Bld: 165 mg/dL — ABNORMAL HIGH (ref 70–99)
Potassium: 4.9 mEq/L (ref 3.5–5.1)
Sodium: 137 mEq/L (ref 135–145)

## 2011-08-26 LAB — CBC
HCT: 27.9 % — ABNORMAL LOW (ref 36.0–46.0)
Hemoglobin: 8.9 g/dL — ABNORMAL LOW (ref 12.0–15.0)
MCH: 23.9 pg — ABNORMAL LOW (ref 26.0–34.0)
MCHC: 31.9 g/dL (ref 30.0–36.0)
MCV: 74.8 fL — ABNORMAL LOW (ref 78.0–100.0)
Platelets: 527 10*3/uL — ABNORMAL HIGH (ref 150–400)
RBC: 3.73 MIL/uL — ABNORMAL LOW (ref 3.87–5.11)
RDW: 15.5 % (ref 11.5–15.5)
WBC: 10.2 10*3/uL (ref 4.0–10.5)

## 2011-08-26 LAB — VANCOMYCIN, RANDOM: Vancomycin Rm: 23.7 ug/mL

## 2011-08-27 LAB — RENAL FUNCTION PANEL
Albumin: 2.1 g/dL — ABNORMAL LOW (ref 3.5–5.2)
BUN: 29 mg/dL — ABNORMAL HIGH (ref 6–23)
CO2: 25 mEq/L (ref 19–32)
Calcium: 8.5 mg/dL (ref 8.4–10.5)
Chloride: 104 mEq/L (ref 96–112)
Creatinine, Ser: 4.61 mg/dL — ABNORMAL HIGH (ref 0.50–1.10)
GFR calc Af Amer: 13 mL/min — ABNORMAL LOW (ref >60)
GFR calc non Af Amer: 10 mL/min — ABNORMAL LOW (ref >60)
Glucose, Bld: 148 mg/dL — ABNORMAL HIGH (ref 70–99)
Phosphorus: 4.4 mg/dL (ref 2.3–4.6)
Potassium: 4.4 mEq/L (ref 3.5–5.1)
Sodium: 139 mEq/L (ref 135–145)

## 2011-08-27 LAB — CBC
HCT: 27.5 % — ABNORMAL LOW (ref 36.0–46.0)
Hemoglobin: 8.6 g/dL — ABNORMAL LOW (ref 12.0–15.0)
MCH: 23.6 pg — ABNORMAL LOW (ref 26.0–34.0)
MCHC: 31.3 g/dL (ref 30.0–36.0)
MCV: 75.3 fL — ABNORMAL LOW (ref 78.0–100.0)
Platelets: 522 10*3/uL — ABNORMAL HIGH (ref 150–400)
RBC: 3.65 MIL/uL — ABNORMAL LOW (ref 3.87–5.11)
RDW: 15.6 % — ABNORMAL HIGH (ref 11.5–15.5)
WBC: 9.6 10*3/uL (ref 4.0–10.5)

## 2011-08-27 LAB — DIFFERENTIAL
Basophils Absolute: 0.1 10*3/uL (ref 0.0–0.1)
Basophils Relative: 1 % (ref 0–1)
Eosinophils Absolute: 0.3 10*3/uL (ref 0.0–0.7)
Eosinophils Relative: 3 % (ref 0–5)
Lymphocytes Relative: 16 % (ref 12–46)
Lymphs Abs: 1.5 10*3/uL (ref 0.7–4.0)
Monocytes Absolute: 0.8 10*3/uL (ref 0.1–1.0)
Monocytes Relative: 9 % (ref 3–12)
Neutro Abs: 6.9 10*3/uL (ref 1.7–7.7)
Neutrophils Relative %: 72 % (ref 43–77)

## 2011-08-27 LAB — GLUCOSE, CAPILLARY
Glucose-Capillary: 115 mg/dL — ABNORMAL HIGH (ref 70–99)
Glucose-Capillary: 149 mg/dL — ABNORMAL HIGH (ref 70–99)
Glucose-Capillary: 162 mg/dL — ABNORMAL HIGH (ref 70–99)
Glucose-Capillary: 169 mg/dL — ABNORMAL HIGH (ref 70–99)
Glucose-Capillary: 178 mg/dL — ABNORMAL HIGH (ref 70–99)

## 2011-08-28 LAB — CBC
HCT: 27.2 % — ABNORMAL LOW (ref 36.0–46.0)
Hemoglobin: 8.7 g/dL — ABNORMAL LOW (ref 12.0–15.0)
MCH: 24.1 pg — ABNORMAL LOW (ref 26.0–34.0)
MCHC: 32 g/dL (ref 30.0–36.0)
MCV: 75.3 fL — ABNORMAL LOW (ref 78.0–100.0)
Platelets: 530 10*3/uL — ABNORMAL HIGH (ref 150–400)
RBC: 3.61 MIL/uL — ABNORMAL LOW (ref 3.87–5.11)
RDW: 15.9 % — ABNORMAL HIGH (ref 11.5–15.5)
WBC: 10.4 10*3/uL (ref 4.0–10.5)

## 2011-08-28 LAB — BASIC METABOLIC PANEL
BUN: 28 mg/dL — ABNORMAL HIGH (ref 6–23)
CO2: 27 mEq/L (ref 19–32)
Calcium: 8.9 mg/dL (ref 8.4–10.5)
Chloride: 103 mEq/L (ref 96–112)
Creatinine, Ser: 4.39 mg/dL — ABNORMAL HIGH (ref 0.50–1.10)
GFR calc Af Amer: 13 mL/min — ABNORMAL LOW (ref >60)
GFR calc non Af Amer: 11 mL/min — ABNORMAL LOW (ref >60)
Glucose, Bld: 183 mg/dL — ABNORMAL HIGH (ref 70–99)
Potassium: 4.6 mEq/L (ref 3.5–5.1)
Sodium: 140 mEq/L (ref 135–145)

## 2011-08-28 LAB — DIFFERENTIAL
Basophils Absolute: 0.1 10*3/uL (ref 0.0–0.1)
Basophils Relative: 1 % (ref 0–1)
Eosinophils Absolute: 0.3 10*3/uL (ref 0.0–0.7)
Eosinophils Relative: 3 % (ref 0–5)
Lymphocytes Relative: 24 % (ref 12–46)
Lymphs Abs: 2.5 10*3/uL (ref 0.7–4.0)
Monocytes Absolute: 0.9 10*3/uL (ref 0.1–1.0)
Monocytes Relative: 9 % (ref 3–12)
Neutro Abs: 6.6 10*3/uL (ref 1.7–7.7)
Neutrophils Relative %: 64 % (ref 43–77)

## 2011-08-28 LAB — GLUCOSE, CAPILLARY: Glucose-Capillary: 235 mg/dL — ABNORMAL HIGH (ref 70–99)

## 2011-08-28 NOTE — Op Note (Signed)
[name], [name] NO.:  [id number]  MEDICAL RECORD NO.:  [id number]  LOCATION:  5035                         FACILITY:  [facility]  PHYSICIAN:  Satira Anis. Bradshaw Minihan, M.D.DATE OF BIRTH:  03/28/1966  DATE OF PROCEDURE:  08/19/2011 DATE OF DISCHARGE:                              OPERATIVE REPORT   PREOPERATIVE DIAGNOSES: 1. Abscess, right hand status post index finger guillotine amputation. 2. Left middle finger osteomyelitis.  POSTOPERATIVE DIAGNOSES: 1. Abscess, right hand status post index finger guillotine amputation. 2. Left middle finger osteomyelitis.  PROCEDURE: 1. Irrigation and debridement excisional in nature of skin,     subcutaneous tissue, muscle, tendon, and bone, right hand. 2. Revision amputation, right ray.  This was a ray amputation revision     with loose primary closure. 3. Left middle finger irrigation and debridement of skin,     subcutaneous, and bone, excisional in nature. 4. Left middle finger distal interphalangeal joint amputation with     bilateral neurectomy.  SURGEON:  Satira Anis. Amedeo Plenty, MD  ASSISTANT:  Avelina Laine, PA-C  COMPLICATIONS:  None.  ANESTHESIA:  General.  TOURNIQUET TIME:  Less than 20 minutes on the left middle finger.  ESTIMATED BLOOD LOSS:  Minimal.  INDICATIONS FOR PROCEDURE:  This is a 45 year old female who presents with the above-mentioned diagnosis.  I have counseled her in regards to risks and benefits of surgery including risk of infection, bleeding, anesthesia, damage to normal structures, failure of surgery to accomplish its intended goals of relieving symptoms and restoring function.  With this in mind, she desires to proceed.  All questions have been encouraged and answered preoperatively.  OPERATIVE PROCEDURE:  The patient was seen by myself and Anesthesia, taken to operative suite, underwent smooth induction of general anesthesia.  Time-out called.  She was prepped and draped in the  usual sterile fashion about both upper extremities.  This was a 10-minute surgical Betadine scrub followed by painting.  Once this was done, I and D of skin, subcutaneous tissue, bone, muscle, and tendon was accomplished about the right hand.  The tissue looked very good.  I was pleased with this.  I resected portions of the skin and subcutaneous tissue aggressively and following this, I performed additional irrigation with 3 liters of saline.  Three liters of saline were placed through-and-through.  Following excisional debridement, attention was turned towards revision ray amputation.  The patient had portions of soft tissue removed.  Radial digital artery to the middle finger was kept intact and following this, we performed a revision amputation to the index finger ray.  Loose closure with drains was accomplished with combination of chromic and Prolene suture.  Following this, the patient underwent I and D of skin, subcutaneous tissue as well as bone and tendon about the left middle finger.  She had devitalized tissue and some areas of purulence and certain osteomyelitis about the distal phalanx.  Given these findings, we aggressively performed I and D of skin, subcutaneous tissue, bone, and tendon.  Three liters of saline were placed to the left middle finger.  Following this, I performed a revision amputation with bilateral neurectomy about the left middle finger at the DIP joint.  This was DIP joint level amputation.  Following this, the wound was closed with chromic and Prolene of the 5-0 variety.  Following this, both extremities were dressed sterilely with Adaptic, Xeroform gauze, and sterile dressing.  Following this, she was taken to recovery room.  After extubation will continue Zosyn and vancomycin.  Her Gram stain showed gram-positive cocci, but we did not have definitive aureus growing out on culture yet and thus we were awaiting final cultures.  She has a  hemoglobin A1c of 14.  She takes very poor care for herself and despite having told me initially she does not drink.  Her family noted that she drank more than half case of beer a day.  We will monitor her condition closely.  We are trying to get her to take better care for herself.  Case management consult and Internal Medicine are on board to help [place].  Fortunately, her wounds looked better and her legs looked better.  We will go ahead and place some nystatin powder on her abdominal skin folds, continue aggressive wound care, and treatment to the lower extremities with Neosporin b.i.d. to be applied.  Should any worsening occur, we will intervene aggressively.  However hopefully, this will be her last formal surgical procedure.  I do think that it is going to be critical for her to engage her upper extremity care.  Without engaging the care plan, I think that her extremities will be doomed to the same type of fate given her multiple medical problems.     Satira Anis. Amedeo Plenty, M.D.     [facility]  D:  08/19/2011  T:  08/20/2011  Job:  [id number]  Electronically Signed by Roseanne Kaufman M.D. on 08/28/2011 01:34:01 PM

## 2011-08-28 NOTE — Op Note (Signed)
[name], [name] NO.:  [id number]  MEDICAL RECORD NO.:  [id number]  LOCATION:  5035                         FACILITY:  [facility]  PHYSICIAN:  Satira Anis. Linnie Delgrande, M.D.DATE OF BIRTH:  [birth year]  DATE OF PROCEDURE:  08/17/2011 DATE OF DISCHARGE:                              OPERATIVE REPORT   PREOPERATIVE DIAGNOSES: 1. Poorly controlled diabetes. 2. Right hand deep abscess with ascending lymphangitis cellulitis and     purulent flexor tenosynovitis rather marked extensive and chronic. 3. Left middle finger chronic open wound. 4. Poor bilateral lower extremity skin and feet condition with a     hypertrophic nail deformity.  POSTOPERATIVE DIAGNOSES: 1. Poorly controlled diabetes. 2. Right hand deep abscess with ascending lymphangitis cellulitis and     purulent flexor tenosynovitis rather marked extensive and chronic. 3. Left middle finger chronic open wound. 4. Poor bilateral lower extremity skin and feet condition with a     hypertrophic nail deformity with noted osteomyelitis about the     middle finger, left hand distal phalanx and the right index finger     with advanced purulent tenosynovitis and complete destruction of     the soft tissues from the proximal phalanx distally.  SURGICAL PROCEDURE PERFORMED.: 1. Irrigation and debridement of right hand skin, subcutaneous tissue,     muscle, tendon, and bone. 2. Open guillotine amputation, right index finger secondary to a     necrotic tissue. 3. Decompression, mid palmar space abscess, right hand. 4. Irrigation and debridement of left middle finger skin, subcutaneous     tissue, bone secondary osteomyelitic involvement. 5. Irrigation and debridement, skin and subcutaneous less than 20 cm2,     right leg. 6. Irrigation and debridement, skin, subcutaneous tissue left leg less     than 20 cm2. 7. Toenail trimming, right and left feet extensive in nature secondary     to poor hypertrophic nail  deformity.  SURGEON:  Satira Anis. Amedeo Plenty, MD  ASSISTANT:  Avelina Laine, PA-C  COMPLICATIONS:  None.  ANESTHESIA:  General.  TOURNIQUET TIME:  Less than an hour.  INDICATIONS FOR PROCEDURE:  Ms. Zieger is a very poorly controlled diabetic.  She had an infection of her hand over 2 weeks, did not seek medical attention.  She states she has tried to just ignore.  She has a whole bone infection in her hand.  She has a white count of 17.5.  She is febrile.  She has ascending cellulitis and looks in horrible condition.  In addition to this, she has a left middle finger with osteomyelitic opened sore.  She states she typically bites her fingers due to sores that she obtains in these areas.  She also has poor condition of her feet and legs.  She has hypertrophic toenail deformity and open sores on her legs.  She also has what appears to be a rash under her skin folds about her abdominal region.  She states she has not taken a shower in a month.  She states she lives in a rental house and works at [employer] on [employer].  She states she usually washer off, but does not take an aggressive shower.  She  notes no locking, popping, numbness, or tingling.  She notes no inciting event other than a slow indolent infectious process, which she has ignored.  She is an insulin-dependent diabetic.  She is not taking any medicines for this it appears.  Her blood sugar is in the 400s today.  I have counseled her that we need to desperately go and try to treat all these areas to the best of our abilities.  I have counseled her in regard to risks and benefits of surgery.  She desires to proceed.  OPERATION IN DETAIL:  The patient was seen by myself and Anesthesia, taken to the operating suite, and underwent smooth induction of general anesthesia,  laid spine, appropriately padded, prepped and draped in usual sterile fashion with Betadine scrub and paint about the right upper extremity.  Once this  was done, I performed I and D of skin, subcutaneous tissue, muscle, tendon, and bone.  She had gross purulent tissue.  Aerobic and anaerobic cultures were taken as well as tissue cultures.  At this time, I then sequentially removed necrotic tissue with the tourniquet deflated.  The tourniquet was actually not inflated during the procedure, and I simply cut back until I was able to gain some semblance of blood flow.  She had a purulent flexor tenosynovitis.  She underwent a open guillotine amputation about her right index finger. She had no meaningful tissue that was viable.  This included bone, tendon.  Both the DIP, PIP, and MCP joints were full pus and in addition to this, the flexor tendon sheath was full pus.  Once we sequentially resected her, I then amputated her open guillotine at the MCP joint.  I retracted the tendons and pulled distally, and was able to get the tendons severed.  Proximal to this, the tendons looked clean.  Following this, I performed a crushing cauterization technique to digital nerves.  I did not encourage upon the middle finger digital nerve (that is the radial digital nerve to the middle finger).  Following this, I performed I and D of midpalmar space abscess.  The patient had decompression of the midpalmar space, deep abscess performed without difficulty.  Thus, I and D, flexor tenosynovectomy, and open guillotine amputation with I and D of the midpalmar space abscess in the right hand was accomplished.  I placed greater than 3 liters through the area in terms of lavage and following this, packed this open.  Certainly, there was our hope not to have to amputate the finger, but there is absolutely no viable tissue and what I would describe is one of the most horrid fingers that has been neglected for quite some time. Given her white count, blood sugars, and overall medical habitus and condition, my fear is sepsis.  I feel that loss of the finger is  better than loss of hand or arm, and complete sepsis.  The patient understood this as I have discussed this with her preop.  After being addressed, the patient then had attention turned towards the middle finger.  I and D of skin, subcutaneous tissue, and bone was accomplished.  The patient tolerated this well and no complicating features.  Once this was complete, I performed very careful and cautious look at the middle finger, left hand.  She had an open area of osteomyelitic focus.  This was rongeured down and skin edges were trimmed, also take off the nail here.  Thus, the left middle finger I and D of osteomyelitic focus and resection of portions  of distal phalanx was accomplished.  We will watch this closely and consider DIP for my amputation as needed.  Following this, I then performed I and D of the right leg skin, subcutaneous tissue, underwent I and D less than 20 cm2 of poorly kept tissue.  Following this, I performed I and D of skin, subcutaneous tissue, left lower extremity of poorly kept tissue.  Following this, I performed right and left feet treatment with removal of hypertrophic nail deformity.  The patient tolerated this well.  There were no complicating features.  I did not cause any break in the skin or bleeding.  I was very careful with all handling.  I felt that it would be absolutely necessary for the patient to have an aggressive look and I and D performed.  Unfortunately, I feel that the patient has poor motivation and poor skill set that is why asked Medicine the consult.  We will ask Social Services to consult.  I have discussed her findings at length and the do's and don'ts, etc.     Satira Anis. Amedeo Plenty, M.D.     [facility]  D:  08/17/2011  T:  08/18/2011  Job:  [id number]  Electronically Signed by Roseanne Kaufman M.D. on 08/28/2011 01:33:53 PM

## 2011-08-28 NOTE — Discharge Summary (Signed)
NAMELENCY, BLANKENBILLER NO.:  [id number]  MEDICAL RECORD NO.:  [id number]  LOCATION:  5035                         FACILITY:  [facility]  PHYSICIAN:  Satira Anis. Rhyanna Sorce, M.D.DATE OF BIRTH:  [birth year]  DATE OF ADMISSION:  08/17/2011 DATE OF DISCHARGE:                              DISCHARGE SUMMARY   [name] was admitted to the hospital with notable findings of abscess in her right hand as well as left middle finger osteomyelitis. I was asked to see and treat her on the date of admission August 17, 2011.  I saw the patient, immediately booked her for surgery and performed a thorough history and physical.  She was admitted with noted uncontrolled diabetes, hypertension and alcoholism.  This was in addition to the neglected wounds in her hand as well as her neglected lower extremity features and abnormal nail growth as well as fungal infection about skin folds in abdominal region.  She was admitted, underwent surgery on the night of admission in the form of guillotine amputation to the right index finger and left middle finger I and D. She subsequently was taken back to surgery for repeat I and D on August 19, 2011.  I should note that we did address her lower extremities with a care plan which involved trimming of her nails and I and D of the lower extremities at the initial operation.  At the time of her second operation, she looked well with loosely closed wounds over drains.  We consulted Pharmacy for vancomycin and Zosyn.  It was noted that her random vancomycin levels were quite high and ultimately her creatinine and BUN increased.  At the time of her initial admission, I consulted Medicine in regards to her multiple medical problems including diabetes, hypertension and alcoholism.  She did not go through any heavy alcoholic withdrawal throughout the course of her hospitalization.  Her diabetes was managed with a sliding scale insulin and ultimately  with insulin recommendations as noted in the DC note.  The patient matriculated through her course of care quite well actually with minimal pain complaints, but was noted to have acute renal failure secondary to acute tubular necrosis secondary to vancomycin toxicity.  This was managed by Medicine initially and they ultimately asked Renal to get involved.  Her levels were monitored closely and ultimately started trending down to a point where they felt it was safe to simply observe this in an outpatient setting.  Dr. Hassell Done made final recommendations.  Dr. Hassell Done felt that she should be monitored as an outpatient with a weekly BMET.  Dr. Hassell Done did not recommend renal follow-up, but recommended that a primary care physician to see the patient.  He spoke to Dr. Lane Hacker who will plan to see the patient next week.  He stated that she should have follow-up arrangements made, but not make them himself.  The patient also has an appointment with [facility] on September 20, 2011, for general medical care which is already been set.  Nevertheless, he felt that the creatinine should be monitored by Dr. Hilma Favors sooner and as this was set into affect by the hospitalist.  I should note that during her hospitalization  her wounds were checked daily.  We performed I and Ds at bedside and dressing changes. Ultimately, at the time of her discharge, her wounds are without signs of discharge or infection.  The left middle finger is clean, dry and intact.  The right middle finger is normal.  The right index finger has undergone ray amputation and has a suture line which is clean, dry and I will continue to monitor this in my office.  At present time, her lower extremities were much improved.  Her feet are clean, dry without signs of infection.  We have recommended lotion, cleansing, and daily washing.  At the time of her discharge, both Renal and Internal Medicine have seen the patient and felt she was  stable.  The patient's anemia was stable. Her alcohol dependency was very stable as she had been abstinent over a week and expressed no desire to return to using alcohol.  Her diabetes was under reasonably good control and she was fit for an outpatient program which was instituted.  She is going to be monitored with home health and have followup.  I should note at the time of her discharge she had a chest exam which showed no wheezing, shortness of breath, and clear lung fields.  Heart was regular rate without murmur.  Abdomen was nontender, nondistended, soft.  Lower extremity examination showed no signs of infection, DVT, neurovascular compromise.  Right and left upper extremities were stable at the suture lines where the amputations have been performed.  She was stable and anxious to be discharged.  Dr. Algis Liming made final recommendations in regards to her medicines.  FINAL DIAGNOSES: 1. Insulin dependent diabetes. 2. Hypertension. 3. Anemia secondary to acute blood loss. 4. Alcohol dependency stable with abstinence exhibited throughout her     hospitalization and no signs of deep venous thrombosis. 5. Right and left hand infection, requiring ray amputation of the     index finger right hand and middle finger DIP joint amputation. 6. Acute renal failure secondary to acute tubular necrosis secondary     to vancomycin toxicity.  RECOMMENDATIONS:  The patient will be discharged home on medicines including Keflex 500 mg 1 p.o. q.i.d. x14 days, doxycycline 100 mg one p.o. b.i.d. x10 days, insulin 70/30 to be taken twice daily with meals and subcutaneous route 15 units, ferrous sulfate 325 mg one p.o. b.i.d., folic acid 1 mg one p.o. daily, thiamine 100 mg one p.o. daily dispensed #30.  She is also going to take amlodipine 10 mg one p.o. daily. Prescriptions have been written.  The patient will followup in my office this Friday for wound check.  She is going to followup with Dr.  Hilma Favors of the Medicine Teaching Service as outlined in her chart.  She will have labs and call to arrange for September 24 appointment.  She will also see Dr. Maryan Rued at [facility] for diabetes recommendations and followup on Monday September 20, 2011, at 2:45 p.m.[name]  She is to go to the health department on Tuesday, Wednesday, or Thursday before 8:00 a.m. or go to [facility] Fridays only before 8:00 a.m. and Dr. Hilma Favors will need to be called for an appointment at the first of the week.  I have discussed these issues with her length of these notes etc.  She appears to have a good understanding of her care plan.  I feel the biggest obstacle to her recovery is herself and her motivation.  We have gone over these issues in detail.  I have grind upon her the necessity of engaging in her own care if she is going to have any chance of a meaningful healthy life into the future.  I appreciate the consultants to help in this very complex/arduous patient.     Satira Anis. Amedeo Plenty, M.D.     [facility]  D:  08/28/2011  T:  08/28/2011  Job:  [id number]  cc:   Dr. Maryan Rued Dr. Hilma Favors  Electronically Signed by Roseanne Kaufman M.D. on 08/28/2011 01:34:08 PM

## 2011-08-28 NOTE — Op Note (Signed)
  NAMERAYEN, SAUSEDO NO.:  [id number]  MEDICAL RECORD NO.:  [id number]  LOCATION:                                 FACILITY:  PHYSICIAN:  Satira Anis. Gaylynn Seiple, M.D.DATE OF BIRTH:  [birth year]  DATE OF PROCEDURE: DATE OF DISCHARGE:                              OPERATIVE REPORT   I had the pleasure to see [name] at bedside.  She had undergone two previous I and Ds.  She is doing very well in regards to the hand and middle finger.  She is making progress.  The patient is scheduled for discharge tomorrow.  Today,  we performed the procedure at bedside. We performed I and D of skin and subcutaneous tissue less than 20 sq cm. This was done with combination scissor, knife, and orthopedic curette. She tolerated this well.  She has no significant erythema and swelling and this decreased nicely.  We irrigated the wound copiously and then instructed her on dressing changes.  She should be ready to go home tomorrow.  She has had increase in her kidney functions, which she can be monitored by Dr. Hilma Favors as an outpatient.  Both Renal and Medicine have been overseeing her renal, heart, diabetes, and hypertension issues.  At the present time, we will plan discharge tomorrow.  Follow up carefully in my office.  With we have instructed [name] on dressing changes and other issues and her discharge note will be dictated tomorrow of course.  This was an I and D at bedside and she tolerated this well.     Satira Anis. Amedeo Plenty, M.D.     [facility]  D:  08/27/2011  T:  08/28/2011  Job:  [id number]  Electronically Signed by Roseanne Kaufman M.D. on 08/28/2011 01:34:10 PM

## 2011-08-28 NOTE — Op Note (Signed)
  [name], [name] NO.:  [id number]  MEDICAL RECORD NO.:  [id number]  LOCATION:  5035                         FACILITY:  [place] [facility]  PHYSICIAN:  Satira Anis. Yazmen Briones, M.D.DATE OF BIRTH:  05-Feb-1966  DATE OF PROCEDURE: DATE OF DISCHARGE:                              OPERATIVE REPORT   ADDENDUM:  [name] had 10 toes addressed with surgical removal of portions of her nail.  This was an I and D of nail about 5 toes on the right and 5 toes on the left.  This should be noted for her record and operative notes.  This operative note is August 16, 2010.  This is an addendum.     Satira Anis. Amedeo Plenty, M.D.     [facility]  D:  08/26/2011  T:  08/26/2011  Job:  [id number]  Electronically Signed by Roseanne Kaufman M.D. on 08/28/2011 01:34:05 PM

## 2011-08-31 ENCOUNTER — Other Ambulatory Visit: Payer: Self-pay | Admitting: Internal Medicine

## 2011-08-31 MED ORDER — LANCETS MISC: Status: DC

## 2011-08-31 MED ORDER — GLUCOSE BLOOD VI STRP: ORAL_STRIP | Status: DC

## 2011-09-01 ENCOUNTER — Encounter: Payer: Self-pay | Admitting: Internal Medicine

## 2011-09-01 ENCOUNTER — Ambulatory Visit (INDEPENDENT_AMBULATORY_CARE_PROVIDER_SITE_OTHER): Payer: Self-pay | Admitting: Internal Medicine

## 2011-09-01 VITALS — BP 181/106 | HR 101 | Temp 98.5°F | Ht 67.0 in | Wt 185.4 lb

## 2011-09-01 DIAGNOSIS — E119 Type 2 diabetes mellitus without complications: Secondary | ICD-10-CM | POA: Insufficient documentation

## 2011-09-01 DIAGNOSIS — I10 Essential (primary) hypertension: Secondary | ICD-10-CM | POA: Insufficient documentation

## 2011-09-01 DIAGNOSIS — IMO0002 Reserved for concepts with insufficient information to code with codable children: Secondary | ICD-10-CM

## 2011-09-01 DIAGNOSIS — I159 Secondary hypertension, unspecified: Secondary | ICD-10-CM | POA: Insufficient documentation

## 2011-09-01 DIAGNOSIS — N179 Acute kidney failure, unspecified: Secondary | ICD-10-CM

## 2011-09-01 DIAGNOSIS — D649 Anemia, unspecified: Secondary | ICD-10-CM | POA: Insufficient documentation

## 2011-09-01 DIAGNOSIS — F1020 Alcohol dependence, uncomplicated: Secondary | ICD-10-CM | POA: Insufficient documentation

## 2011-09-01 DIAGNOSIS — E1165 Type 2 diabetes mellitus with hyperglycemia: Secondary | ICD-10-CM

## 2011-09-01 DIAGNOSIS — IMO0001 Reserved for inherently not codable concepts without codable children: Secondary | ICD-10-CM

## 2011-09-01 MED ORDER — LANCETS MISC: Status: DC

## 2011-09-01 MED ORDER — AMLODIPINE BESYLATE 10 MG PO TABS: 10.0000 mg | ORAL_TABLET | Freq: Every day | ORAL | Status: DC

## 2011-09-01 MED ORDER — GLUCOSE BLOOD VI STRP: ORAL_STRIP | Status: DC

## 2011-09-01 MED ORDER — INSULIN NPH ISOPHANE & REGULAR (70-30) 100 UNIT/ML ~~LOC~~ SUSP: 18.0000 [IU] | Freq: Two times a day (BID) | SUBCUTANEOUS | Status: DC

## 2011-09-01 MED ORDER — "INSULIN SYRINGE-NEEDLE U-100 31G X 5/16"" 0.3 ML MISC": Status: DC

## 2011-09-01 MED ORDER — INSULIN ASPART PROT & ASPART (70-30 MIX) 100 UNIT/ML ~~LOC~~ SUSP: 18.0000 [IU] | Freq: Two times a day (BID) | SUBCUTANEOUS | Status: DC

## 2011-09-01 NOTE — Progress Notes (Signed)
Subjective:    Patient ID: [name], female    DOB: [birth year], 45 y.o.   MRN: [id number]  HPI Pt is a 45 y.o. female who  has a past medical history of Diabetes mellitus type 2, uncontrolled; Hypertension; Anemia; and Alcohol dependency. and presents to clinic today for the following:     1) Hospital follow-up - Patient was hospitalized at [facility] from September 11 - 22, 2012  for evaluation of right first digit abscess and was found to have left middle finger OM, for which Dr. Amedeo Plenty, of orthopedic surgery performed amputation of the respective fingers on 08/18/2011, with subsewuent repeat I&D on the 13th. She was initially treated with IV Abx and then transitioned to oral medications, with dc on Keflex and Doxycycline, which she states she has been taking regularly. She will follow up with Dr. Amedeo Plenty on Friday, Sept 28, 2012.   She was also found uncontrolled diabetes, hypertension, and known hx of alcohol abuse for which Internal medicine service was consulted. A1c was found to be 13.9. Was not taking her insulin prior to hospitalization, and therefore was resumed on insulin therapy and transitioned to 70/30 secondary to lack of insurance, and financial concerns. She did not have DTs 2/2 to her alcohol abuse, and seemed to have minimal withdrawal symptoms per discharge summary.  Lastly, during hospital course, she was found to be in ARF thought 2/2 to ATN due to Vanc toxicity, renal was consulted. She did not require HD. She is recommended to continue weekly BMETs, which we are helping to facilitate until her appt with [facility] on 10/15.   Since hospital discharge, patient does note significant improvement of symptoms. is taking recommended medications regularly, although she admits to having run out of her Norvasc yesterday. Denies fever, chills, drainage from wound sites, areas of redness of skin, nausea, vomiting, diarrhea, abdominal pain. States she is voiding, stooling, eating, drinking  well and without difficulty.  2) DMII -  Lab Results  Component Value Date   HGBA1C 13.9* 08/19/2011   Patient checking blood sugars 2 times daily, before breakfast and before dinner. Reports fasting blood sugars of 200-300s mg/dL preprandial to breakfast and 200s before dinner. Currently taking insulin 70/30, 15units BID. 0 hypoglycemic episodes since last visit. denies polyuria, polydipsia, nausea, vomiting, diarrhea.  does request refills today - needs refills of test strips, insulin.   3) HTN - Patient does not check blood pressure regularly at home. Currently taking amlodipine (Norvase), although has not taken today. denies headaches, dizziness, lightheadedness, chest pain, shortness of breath.  does request refills today.    Review of Systems Per HPI.  Current Outpatient Medications Medication Sig  . amLODipine (NORVASC) 10 MG tablet Take 1 tablet (10 mg total) by mouth daily.  . cephALEXin (KEFLEX) 500 MG capsule Take 500 mg by mouth 4 (four) times daily.    [name] doxycycline (ADOXA) 100 MG tablet Take 100 mg by mouth 2 (two) times daily.    . ferrous sulfate 325 (65 FE) MG tablet Take 325 mg by mouth 2 (two) times daily with a meal.    . folic acid (FOLVITE) 1 MG tablet Take 1 mg by mouth daily.    . insulin NPH-insulin regular (NOVOLIN 70/30) (70-30) 100 UNIT/ML injection Inject 15 Units into the skin 2 (two) times daily with a meal.  . thiamine 100 MG tablet Take 100 mg by mouth daily.      Allergies Review of patient's allergies indicates no known allergies.  Past Medical  History  Diagnosis Date  . Diabetes mellitus type 2, uncontrolled   . Hypertension   . Anemia   . Alcohol dependency     Past Surgical History  Procedure Date  . Amputation     right first and left middle finger amputation 2/2 OM 08/2011 by Dr. Amedeo Plenty  . Cesarean section        Objective:   Physical Exam   Filed Vitals:   09/01/11 1328  BP: 181/106  Pulse: 101  Temp: 98.5 F (36.9 C)       General: Vital signs reviewed and noted. Well-developed, well-nourished, in no acute distress; alert, appropriate and cooperative throughout examination.  Head: Normocephalic, atraumatic.  Lungs:  Normal respiratory effort. Clear to auscultation BL without crackles or wheezes.  Heart: RRR. S1 and S2 normal without gallop, rubs. (+) murmur.  Abdomen:  BS normoactive. Soft, Nondistended, non-tender.  No masses or organomegaly.  Extremities: No pretibial edema. Right and left digits bandaged, clean, dry intact bandaging. No warmth, erythema of surrounding skin.       Assessment & Plan:  Case and plan of care discussed with Dr. Bertha Stakes.

## 2011-09-01 NOTE — Assessment & Plan Note (Signed)
Lab Results  Component Value Date   HGBA1C 13.9* 08/19/2011   HGBA1C 14.0* 08/18/2011   Lab Results  Component Value Date   CREATININE 4.39* 08/28/2011    Assessment:  Disease Control: not controlled  Progress toward goals: unchanged  Barriers to meeting goals: lack of insurance   Plan: Glucometer log was not reviewed today, as pt did not have glucometer available for review.  Diabetes treatment:    - increase 70/30 insulin to 18 units twice daily. - reminded to bring blood glucose meter & log to each visit and reminded to bring medications to each visit - refilled insulin and test strips

## 2011-09-01 NOTE — Assessment & Plan Note (Signed)
BP Readings from Last 3 Encounters:  09/01/11 0000000    Basic Metabolic Panel:    Component Value Date/Time   NA 140 08/28/2011 0610   K 4.6 08/28/2011 0610   CL 103 08/28/2011 0610   CO2 27 08/28/2011 0610   BUN 28* 08/28/2011 0610   CREATININE 4.39* 08/28/2011 0610   GLUCOSE 183* 08/28/2011 0610   CALCIUM 8.9 08/28/2011 0610    Assessment: Hypertension Control: not controlled  Progress toward goals: deteriorated  Barriers to meeting goals: did not take medication today.   Plan: Hypertension treatment:   - continue current medications - recommended to continue medications regularly, will need to be followed up.

## 2011-09-01 NOTE — Patient Instructions (Addendum)
   Please follow-up with [facility] on September 20, 2011 at your already scheduled visit, at which time they will need to reevaluate your diabetes, blood pressure, and kidney function.  Diabetes -   Keep up your good work with checking blood sugars twice daily  Increase your insulin to 18 units twice daily, 30 minutes before meals.   If you start getting blood sugars less than 70, go back to the 15 units and call your doctor.  Please bring your meter with you for review at [facility]  Blood pressure -  Please continue to take your medications regularly and without missing doses.  Finger amputations - please follow-up with Dr. Amedeo Plenty at your schedule visit on Friday.  Please bring all of your medications in a bag to your next visit.

## 2011-09-01 NOTE — Assessment & Plan Note (Signed)
The patient has been abstinent from alcohol since hospital discharge. - Recommended to continue her valiant efforts towards alcohol cessation.

## 2011-09-01 NOTE — Assessment & Plan Note (Addendum)
Due to ATN from vanc toxicity in hospital course. [facility]) was reviewed today, showing improving renal function with cr of 3.77. - Will require repeat BMET and follow-up visit with [facility] on Oct 15.

## 2011-09-02 LAB — GLUCOSE, CAPILLARY: Glucose-Capillary: 262 mg/dL — ABNORMAL HIGH (ref 70–99)

## 2011-09-02 NOTE — Progress Notes (Signed)
Office notes and info faxed to [facility] per Dr Guy Sandifer 09/02/11 3:45PM. Hilda Blades Sky Primo RN 09/02/11 3:45PM

## 2011-09-03 ENCOUNTER — Telehealth: Payer: Self-pay | Admitting: *Deleted

## 2011-09-03 NOTE — Telephone Encounter (Signed)
Pt calls and states she needs some insulin and needles, she uses novolin 70/30, will check w/ kayeg. For sample.

## 2011-09-03 NOTE — Telephone Encounter (Signed)
We do not have any Novolog 70/30 but we do have Lantus. She can not get any medicine until her appointment at [facility] on 09/20/11. Therefore I will temporarily switch her to Lantus 30u in the evening and we will give her a sample of Lantus and needles to last until her [facility] appointment.

## 2011-09-07 ENCOUNTER — Telehealth: Payer: Self-pay | Admitting: *Deleted

## 2011-09-07 NOTE — Telephone Encounter (Signed)
[facility] calls and states pt cbg is reading HI x 2 days, asymptomatic, appt scheduled for 10/3 at 1345, unable to reach pt at this time, [facility] informed and lm for pt to call will call in am.

## 2011-09-08 ENCOUNTER — Encounter: Payer: Self-pay | Admitting: Internal Medicine

## 2011-09-08 ENCOUNTER — Ambulatory Visit (INDEPENDENT_AMBULATORY_CARE_PROVIDER_SITE_OTHER): Payer: Self-pay | Admitting: Internal Medicine

## 2011-09-08 VITALS — BP 145/89 | HR 94 | Temp 98.6°F | Wt 179.8 lb

## 2011-09-08 DIAGNOSIS — E1165 Type 2 diabetes mellitus with hyperglycemia: Secondary | ICD-10-CM

## 2011-09-08 DIAGNOSIS — IMO0002 Reserved for concepts with insufficient information to code with codable children: Secondary | ICD-10-CM

## 2011-09-08 DIAGNOSIS — N179 Acute kidney failure, unspecified: Secondary | ICD-10-CM

## 2011-09-08 DIAGNOSIS — IMO0001 Reserved for inherently not codable concepts without codable children: Secondary | ICD-10-CM

## 2011-09-08 LAB — BASIC METABOLIC PANEL
BUN: 32 mg/dL — ABNORMAL HIGH (ref 6–23)
CO2: 23 mEq/L (ref 19–32)
Calcium: 9.3 mg/dL (ref 8.4–10.5)
Chloride: 99 mEq/L (ref 96–112)
Creat: 1.83 mg/dL — ABNORMAL HIGH (ref 0.50–1.10)
Glucose, Bld: 309 mg/dL — ABNORMAL HIGH (ref 70–99)
Potassium: 4.1 mEq/L (ref 3.5–5.3)
Sodium: 135 mEq/L (ref 135–145)

## 2011-09-08 LAB — GLUCOSE, CAPILLARY
Glucose-Capillary: 288 mg/dL — ABNORMAL HIGH (ref 70–99)
Glucose-Capillary: 283 mg/dL — ABNORMAL HIGH (ref 70–99)

## 2011-09-08 LAB — PHOSPHORUS: Phosphorus: 2.9 mg/dL (ref 2.3–4.6)

## 2011-09-08 LAB — MAGNESIUM: Magnesium: 1.6 mg/dL (ref 1.5–2.5)

## 2011-09-08 MED ORDER — INSULIN GLARGINE 100 UNIT/ML ~~LOC~~ SOLN
23.0000 [IU] | Freq: Once | SUBCUTANEOUS | Status: AC
  Administered 2011-09-08: 23 [IU] via SUBCUTANEOUS

## 2011-09-08 MED ORDER — INSULIN REGULAR HUMAN 100 UNIT/ML IJ SOLN
15.0000 [IU] | Freq: Once | INTRAMUSCULAR | Status: AC
  Administered 2011-09-08: 15 [IU] via SUBCUTANEOUS

## 2011-09-08 NOTE — Telephone Encounter (Signed)
Have called and left another message

## 2011-09-08 NOTE — Patient Instructions (Signed)
Please, skip tonight's dose of insulin as instructed. Start injecting yourself with Lantus Solostar pen at 30 units before bedtime staring 09/09/11. Please, check your blood sugar at home before breakfast daily. Please, call with any questions and follow up with your primary care physician at [facility] as planned.

## 2011-09-08 NOTE — Progress Notes (Signed)
  Subjective:    Patient ID: [name], female    DOB: 02/12/1966, 45 y.o.   MRN: [id number]  HPI  1. DM, insulin-dependent. Patient is a Careers information officer patient, seen as a courts ey to the patient due to unavailability of appointments at [facility] until September 20, 2011. Patient reports that "her sugars are running high." History of recent hospital admission for right index finger osteomyelitis sp amputation. Reports running out of Novolog 70/30 insulin and inability to pay for it out of pocket.   Review of Systems  Constitutional: Positive for malaise/fatigue. Negative for fever, chills, weight loss and diaphoresis.  HENT: Negative.   Eyes: Positive for blurred vision. Negative for double vision.  Respiratory: Negative for shortness of breath.   Cardiovascular: Negative.   Gastrointestinal: Negative for nausea, vomiting, abdominal pain, diarrhea and constipation.  Genitourinary: Negative for dysuria, frequency and flank pain.  Musculoskeletal: Negative for myalgias, back pain and falls.  Skin: Negative for rash.  Neurological: Negative for dizziness, tingling, tremors and weakness.  Psychiatric/Behavioral: Negative for depression.   Review of Systems  Constitutional: Positive for malaise/fatigue. Negative for fever, chills, weight loss and diaphoresis.  HENT: Negative.   Eyes: Positive for blurred vision. Negative for double vision.  Respiratory: Negative for shortness of breath.   Cardiovascular: Negative.   Gastrointestinal: Negative for nausea, vomiting, abdominal pain, diarrhea and constipation.  Genitourinary: Negative for dysuria, frequency and flank pain.  Musculoskeletal: Negative for myalgias, back pain and falls.  Skin: Negative for rash.  Neurological: Negative for dizziness, tingling, tremors and weakness.  Psychiatric/Behavioral: Negative for depression.       Objective:   Physical Exam Vitals: noted General: alert, well-developed, and cooperative to  examination.  Head: normocephalic and atraumatic.  Eyes: vision grossly intact, pupils equal, pupils round, pupils reactive to light, no injection and anicteric.  Mouth: pharynx pink and moist, no erythema, and no exudates.  Neck: supple, full ROM, no thyromegaly, no JVD, and no carotid bruits.  Lungs: normal respiratory effort, no accessory muscle use, normal breath sounds, no crackles, and no wheezes. Heart: normal rate, regular rhythm, no murmur, no gallop, and no rub.  Abdomen: soft, non-tender, normal bowel sounds, no distention, no guarding, no rebound tenderness, no hepatomegaly, and no splenomegaly.  Msk: no joint swelling, no joint warmth, and no redness over joints.  Pulses: 2+ DP/PT pulses bilaterally Extremities: No cyanosis, clubbing, edema Neurologic: alert & oriented X3, cranial nerves II-XII intact, strength normal in all extremities, sensation intact to light touch, and gait normal.  Skin: turgor normal and no rashes.  Psych: Oriented X3, memory intact for recent and remote, normally interactive, good eye contact, not anxious appearing, and not depressed appearing.          Assessment & Plan:  1. DM, insulin-dependent. Uncontrolled. Patient has financial difficulties and cannot afford her medications including Novolog 70/30.  CBG in the office at 288.  - Regular 15 Units SQ x1 now -repeat CBG in 20 min -D/C insulin 70/30 for now (cannot afford) -Sample of Lantus solostar Pen given, instructed on use. 30 Units SQ qhs. 2. Hx of recent AKI while in the hospital. -B-met, Mg, Phos today. 3. Follow up at [facility]. Patient is instructed to contact out clinic with any questions.

## 2011-09-09 ENCOUNTER — Telehealth: Payer: Self-pay | Admitting: *Deleted

## 2011-09-09 NOTE — Telephone Encounter (Signed)
Pt called clinic with c/o elevated CBG's - call received at 1620 Returned call at 1630 and no answer.  Message left for pt to return my call.

## 2011-09-10 NOTE — Telephone Encounter (Signed)
Tried to call pt back again today and no answer, another message was left.   Also asked pt to clarify if she is a pt of [facility] or does she want to transfer to our clinic.

## 2011-12-23 ENCOUNTER — Observation Stay (HOSPITAL_COMMUNITY): Payer: Self-pay

## 2011-12-23 ENCOUNTER — Inpatient Hospital Stay (HOSPITAL_COMMUNITY)
Admission: EM | Admit: 2011-12-23 | Discharge: 2011-12-29 | DRG: 513 | Disposition: A | Discharge status: Home / Self Care | Payer: Self-pay | Attending: Infectious Diseases | Admitting: Infectious Diseases

## 2011-12-23 ENCOUNTER — Encounter (HOSPITAL_COMMUNITY): Payer: Self-pay | Admitting: General Practice

## 2011-12-23 ENCOUNTER — Observation Stay (HOSPITAL_COMMUNITY): Payer: Self-pay | Admitting: Anesthesiology

## 2011-12-23 ENCOUNTER — Encounter (HOSPITAL_COMMUNITY): Payer: Self-pay | Admitting: Anesthesiology

## 2011-12-23 ENCOUNTER — Encounter (HOSPITAL_COMMUNITY)
Admission: EM | Disposition: A | Discharge status: Home / Self Care | Payer: Self-pay | Source: Home / Self Care | Attending: Infectious Diseases

## 2011-12-23 ENCOUNTER — Encounter (HOSPITAL_COMMUNITY): Payer: Self-pay | Admitting: Emergency Medicine

## 2011-12-23 DIAGNOSIS — L03019 Cellulitis of unspecified finger: Secondary | ICD-10-CM | POA: Diagnosis present

## 2011-12-23 DIAGNOSIS — E119 Type 2 diabetes mellitus without complications: Secondary | ICD-10-CM | POA: Insufficient documentation

## 2011-12-23 DIAGNOSIS — M869 Osteomyelitis, unspecified: Secondary | ICD-10-CM

## 2011-12-23 DIAGNOSIS — E1169 Type 2 diabetes mellitus with other specified complication: Secondary | ICD-10-CM | POA: Diagnosis present

## 2011-12-23 DIAGNOSIS — I10 Essential (primary) hypertension: Secondary | ICD-10-CM | POA: Insufficient documentation

## 2011-12-23 DIAGNOSIS — S68118A Complete traumatic metacarpophalangeal amputation of other finger, initial encounter: Secondary | ICD-10-CM

## 2011-12-23 DIAGNOSIS — I159 Secondary hypertension, unspecified: Secondary | ICD-10-CM | POA: Insufficient documentation

## 2011-12-23 DIAGNOSIS — E1165 Type 2 diabetes mellitus with hyperglycemia: Secondary | ICD-10-CM

## 2011-12-23 DIAGNOSIS — A480 Gas gangrene: Secondary | ICD-10-CM | POA: Diagnosis present

## 2011-12-23 DIAGNOSIS — L989 Disorder of the skin and subcutaneous tissue, unspecified: Secondary | ICD-10-CM | POA: Diagnosis present

## 2011-12-23 DIAGNOSIS — Z833 Family history of diabetes mellitus: Secondary | ICD-10-CM

## 2011-12-23 DIAGNOSIS — M726 Necrotizing fasciitis: Principal | ICD-10-CM | POA: Diagnosis present

## 2011-12-23 DIAGNOSIS — L02519 Cutaneous abscess of unspecified hand: Secondary | ICD-10-CM | POA: Diagnosis present

## 2011-12-23 DIAGNOSIS — IMO0002 Reserved for concepts with insufficient information to code with codable children: Secondary | ICD-10-CM

## 2011-12-23 DIAGNOSIS — F1010 Alcohol abuse, uncomplicated: Secondary | ICD-10-CM

## 2011-12-23 DIAGNOSIS — L0390 Cellulitis, unspecified: Secondary | ICD-10-CM

## 2011-12-23 HISTORY — PX: OTHER SURGICAL HISTORY: SHX169

## 2011-12-23 HISTORY — PX: I & D EXTREMITY: SHX5045

## 2011-12-23 LAB — CULTURE, BLOOD (ROUTINE X 2)
Culture  Setup Time: 201301172019
Culture  Setup Time: 201301172019
Culture: NO GROWTH
Culture: NO GROWTH

## 2011-12-23 LAB — GLUCOSE, CAPILLARY
Glucose-Capillary: 285 mg/dL — ABNORMAL HIGH (ref 70–99)
Glucose-Capillary: 209 mg/dL — ABNORMAL HIGH (ref 70–99)
Glucose-Capillary: 222 mg/dL — ABNORMAL HIGH (ref 70–99)
Glucose-Capillary: 193 mg/dL — ABNORMAL HIGH (ref 70–99)

## 2011-12-23 LAB — DIFFERENTIAL
Basophils Absolute: 0 10*3/uL (ref 0.0–0.1)
Basophils Relative: 0 % (ref 0–1)
Eosinophils Absolute: 0.1 10*3/uL (ref 0.0–0.7)
Eosinophils Relative: 1 % (ref 0–5)
Lymphocytes Relative: 7 % — ABNORMAL LOW (ref 12–46)
Lymphs Abs: 1.2 10*3/uL (ref 0.7–4.0)
Monocytes Absolute: 1.2 10*3/uL — ABNORMAL HIGH (ref 0.1–1.0)
Monocytes Relative: 7 % (ref 3–12)
Neutro Abs: 14.3 10*3/uL — ABNORMAL HIGH (ref 1.7–7.7)
Neutrophils Relative %: 85 % — ABNORMAL HIGH (ref 43–77)

## 2011-12-23 LAB — CBC
HCT: 34.6 % — ABNORMAL LOW (ref 36.0–46.0)
Hemoglobin: 11 g/dL — ABNORMAL LOW (ref 12.0–15.0)
MCH: 24.1 pg — ABNORMAL LOW (ref 26.0–34.0)
MCHC: 31.8 g/dL (ref 30.0–36.0)
MCV: 75.9 fL — ABNORMAL LOW (ref 78.0–100.0)
Platelets: 359 10*3/uL (ref 150–400)
RBC: 4.56 MIL/uL (ref 3.87–5.11)
RDW: 12.8 % (ref 11.5–15.5)
WBC: 16.8 10*3/uL — ABNORMAL HIGH (ref 4.0–10.5)

## 2011-12-23 LAB — ANAEROBIC CULTURE: Gram Stain: NONE SEEN

## 2011-12-23 LAB — BASIC METABOLIC PANEL
BUN: 14 mg/dL (ref 6–23)
CO2: 23 mEq/L (ref 19–32)
Calcium: 9.4 mg/dL (ref 8.4–10.5)
Chloride: 96 mEq/L (ref 96–112)
Creatinine, Ser: 0.79 mg/dL (ref 0.50–1.10)
GFR calc Af Amer: >90 mL/min (ref >90)
GFR calc non Af Amer: >90 mL/min (ref >90)
Glucose, Bld: 363 mg/dL — ABNORMAL HIGH (ref 70–99)
Potassium: 5.3 mEq/L — ABNORMAL HIGH (ref 3.5–5.1)
Sodium: 133 mEq/L — ABNORMAL LOW (ref 135–145)

## 2011-12-23 LAB — CULTURE, ROUTINE-ABSCESS
Culture: NO GROWTH
Gram Stain: NONE SEEN

## 2011-12-23 LAB — GRAM STAIN

## 2011-12-23 SURGERY — IRRIGATION AND DEBRIDEMENT EXTREMITY
Anesthesia: General | Site: Thumb | Laterality: Left | Wound class: Dirty or Infected

## 2011-12-23 MED ORDER — AMLODIPINE BESYLATE 10 MG PO TABS
10.0000 mg | ORAL_TABLET | Freq: Every day | ORAL | Status: DC
  Administered 2011-12-23 – 2011-12-29 (×7): 10 mg via ORAL
  Filled 2011-12-23 (×8): qty 1

## 2011-12-23 MED ORDER — SODIUM CHLORIDE 0.9 % IR SOLN
Status: DC | PRN
  Administered 2011-12-23 (×2): 3000 mL

## 2011-12-23 MED ORDER — PROPOFOL 10 MG/ML IV EMUL
INTRAVENOUS | Status: DC | PRN
  Administered 2011-12-23: 140 mg via INTRAVENOUS

## 2011-12-23 MED ORDER — METOCLOPRAMIDE HCL 5 MG/ML IJ SOLN
5.0000 mg | Freq: Three times a day (TID) | INTRAMUSCULAR | Status: DC | PRN
  Filled 2011-12-23: qty 2

## 2011-12-23 MED ORDER — PIPERACILLIN-TAZOBACTAM 4.5 G IVPB
4.5000 g | Freq: Once | INTRAVENOUS | Status: AC
  Administered 2011-12-23: 4.5 g via INTRAVENOUS
  Filled 2011-12-23: qty 100

## 2011-12-23 MED ORDER — BUPIVACAINE HCL (PF) 0.25 % IJ SOLN
INTRAMUSCULAR | Status: DC | PRN
  Administered 2011-12-23: 10 mL

## 2011-12-23 MED ORDER — INSULIN ASPART 100 UNIT/ML ~~LOC~~ SOLN
5.0000 [IU] | Freq: Once | SUBCUTANEOUS | Status: AC
  Administered 2011-12-23: 5 [IU] via SUBCUTANEOUS

## 2011-12-23 MED ORDER — LORAZEPAM 2 MG/ML IJ SOLN: 1.0000 mg | Freq: Four times a day (QID) | INTRAMUSCULAR | Status: AC | PRN

## 2011-12-23 MED ORDER — INSULIN ASPART 100 UNIT/ML ~~LOC~~ SOLN
SUBCUTANEOUS | Status: AC
  Filled 2011-12-23: qty 1

## 2011-12-23 MED ORDER — FERROUS SULFATE 325 (65 FE) MG PO TABS
325.0000 mg | ORAL_TABLET | Freq: Two times a day (BID) | ORAL | Status: DC
  Administered 2011-12-23 – 2011-12-29 (×12): 325 mg via ORAL
  Filled 2011-12-23 (×15): qty 1

## 2011-12-23 MED ORDER — THIAMINE HCL 100 MG/ML IJ SOLN
100.0000 mg | Freq: Every day | INTRAMUSCULAR | Status: DC
  Filled 2011-12-23 (×7): qty 1

## 2011-12-23 MED ORDER — INSULIN ASPART 100 UNIT/ML ~~LOC~~ SOLN: 10.0000 [IU] | Freq: Once | SUBCUTANEOUS | Status: DC

## 2011-12-23 MED ORDER — LACTATED RINGERS IV SOLN
INTRAVENOUS | Status: DC | PRN
  Administered 2011-12-23 (×2): via INTRAVENOUS

## 2011-12-23 MED ORDER — ONDANSETRON HCL 4 MG/2ML IJ SOLN
4.0000 mg | Freq: Four times a day (QID) | INTRAMUSCULAR | Status: DC | PRN
  Administered 2011-12-24 – 2011-12-27 (×2): 4 mg via INTRAVENOUS
  Filled 2011-12-23 (×2): qty 2

## 2011-12-23 MED ORDER — FENTANYL CITRATE 0.05 MG/ML IJ SOLN
INTRAMUSCULAR | Status: DC | PRN
  Administered 2011-12-23 (×4): 50 ug via INTRAVENOUS
  Administered 2011-12-23: 100 ug via INTRAVENOUS

## 2011-12-23 MED ORDER — HYDROMORPHONE HCL PF 1 MG/ML IJ SOLN
0.5000 mg | INTRAMUSCULAR | Status: DC | PRN
  Administered 2011-12-24 – 2011-12-28 (×5): 1 mg via INTRAVENOUS
  Filled 2011-12-23 (×5): qty 1

## 2011-12-23 MED ORDER — METOCLOPRAMIDE HCL 10 MG PO TABS: 5.0000 mg | ORAL_TABLET | Freq: Three times a day (TID) | ORAL | Status: DC | PRN

## 2011-12-23 MED ORDER — ONDANSETRON HCL 4 MG/2ML IJ SOLN: 4.0000 mg | Freq: Once | INTRAMUSCULAR | Status: DC | PRN

## 2011-12-23 MED ORDER — MORPHINE SULFATE 4 MG/ML IJ SOLN
4.0000 mg | Freq: Once | INTRAMUSCULAR | Status: AC
  Administered 2011-12-23: 4 mg via INTRAVENOUS
  Filled 2011-12-23: qty 1

## 2011-12-23 MED ORDER — METHOCARBAMOL 100 MG/ML IJ SOLN
500.0000 mg | Freq: Four times a day (QID) | INTRAVENOUS | Status: DC | PRN
  Filled 2011-12-23: qty 5

## 2011-12-23 MED ORDER — NEOSTIGMINE METHYLSULFATE 1 MG/ML IJ SOLN
INTRAMUSCULAR | Status: DC | PRN
  Administered 2011-12-23: 4 mg via INTRAVENOUS

## 2011-12-23 MED ORDER — METHOCARBAMOL 500 MG PO TABS
500.0000 mg | ORAL_TABLET | Freq: Four times a day (QID) | ORAL | Status: DC | PRN
  Filled 2011-12-23: qty 1

## 2011-12-23 MED ORDER — LORAZEPAM 1 MG PO TABS: 1.0000 mg | ORAL_TABLET | Freq: Four times a day (QID) | ORAL | Status: AC | PRN

## 2011-12-23 MED ORDER — MIDAZOLAM HCL 5 MG/5ML IJ SOLN
INTRAMUSCULAR | Status: DC | PRN
  Administered 2011-12-23: 2 mg via INTRAVENOUS

## 2011-12-23 MED ORDER — LIDOCAINE HCL (CARDIAC) 20 MG/ML IV SOLN
INTRAVENOUS | Status: DC | PRN
  Administered 2011-12-23: 40 mg via INTRAVENOUS

## 2011-12-23 MED ORDER — VANCOMYCIN HCL IN DEXTROSE 1-5 GM/200ML-% IV SOLN
1000.0000 mg | Freq: Three times a day (TID) | INTRAVENOUS | Status: DC
  Administered 2011-12-23 – 2011-12-24 (×3): 1000 mg via INTRAVENOUS
  Filled 2011-12-23 (×5): qty 200

## 2011-12-23 MED ORDER — AMLODIPINE BESYLATE 10 MG PO TABS
10.0000 mg | ORAL_TABLET | Freq: Every day | ORAL | Status: DC
  Filled 2011-12-23: qty 1

## 2011-12-23 MED ORDER — INSULIN ASPART 100 UNIT/ML ~~LOC~~ SOLN
18.0000 [IU] | Freq: Two times a day (BID) | SUBCUTANEOUS | Status: DC
  Administered 2011-12-23: 18 [IU] via SUBCUTANEOUS

## 2011-12-23 MED ORDER — VITAMIN B-1 100 MG PO TABS
100.0000 mg | ORAL_TABLET | Freq: Every day | ORAL | Status: DC
  Administered 2011-12-23 – 2011-12-29 (×6): 100 mg via ORAL
  Filled 2011-12-23 (×7): qty 1

## 2011-12-23 MED ORDER — OXYCODONE-ACETAMINOPHEN 5-325 MG PO TABS
1.0000 | ORAL_TABLET | ORAL | Status: DC | PRN
  Administered 2011-12-23 – 2011-12-29 (×16): 2 via ORAL
  Filled 2011-12-23 (×16): qty 2

## 2011-12-23 MED ORDER — VANCOMYCIN HCL IN DEXTROSE 1-5 GM/200ML-% IV SOLN
1000.0000 mg | Freq: Once | INTRAVENOUS | Status: AC
  Administered 2011-12-23: 1000 mg via INTRAVENOUS
  Filled 2011-12-23 (×2): qty 200

## 2011-12-23 MED ORDER — DIPHENHYDRAMINE HCL 12.5 MG/5ML PO ELIX
12.5000 mg | ORAL_SOLUTION | ORAL | Status: DC | PRN
  Filled 2011-12-23: qty 10

## 2011-12-23 MED ORDER — ONDANSETRON HCL 4 MG/2ML IJ SOLN
INTRAMUSCULAR | Status: DC | PRN
  Administered 2011-12-23: 4 mg via INTRAVENOUS

## 2011-12-23 MED ORDER — GLYCOPYRROLATE 0.2 MG/ML IJ SOLN
INTRAMUSCULAR | Status: DC | PRN
  Administered 2011-12-23: .7 mg via INTRAVENOUS

## 2011-12-23 MED ORDER — SODIUM CHLORIDE 0.9 % IV BOLUS (SEPSIS)
500.0000 mL | INTRAVENOUS | Status: AC
  Administered 2011-12-23: 500 mL via INTRAVENOUS

## 2011-12-23 MED ORDER — INSULIN GLARGINE 100 UNIT/ML ~~LOC~~ SOLN
15.0000 [IU] | Freq: Every day | SUBCUTANEOUS | Status: DC
  Administered 2011-12-23: 15 [IU] via SUBCUTANEOUS
  Filled 2011-12-23: qty 3

## 2011-12-23 MED ORDER — INSULIN ASPART 100 UNIT/ML ~~LOC~~ SOLN
10.0000 [IU] | Freq: Once | SUBCUTANEOUS | Status: AC
  Administered 2011-12-23: 10 [IU] via INTRAVENOUS
  Filled 2011-12-23: qty 1

## 2011-12-23 MED ORDER — HYDROMORPHONE HCL PF 1 MG/ML IJ SOLN: 0.2500 mg | INTRAMUSCULAR | Status: DC | PRN

## 2011-12-23 MED ORDER — ONDANSETRON HCL 4 MG PO TABS
4.0000 mg | ORAL_TABLET | Freq: Four times a day (QID) | ORAL | Status: DC | PRN
  Administered 2011-12-26 – 2011-12-28 (×2): 4 mg via ORAL
  Filled 2011-12-23 (×2): qty 1

## 2011-12-23 MED ORDER — ROCURONIUM BROMIDE 100 MG/10ML IV SOLN
INTRAVENOUS | Status: DC | PRN
  Administered 2011-12-23: 35 mg via INTRAVENOUS

## 2011-12-23 MED ORDER — ACETAMINOPHEN 325 MG PO TABS
650.0000 mg | ORAL_TABLET | Freq: Once | ORAL | Status: AC
  Administered 2011-12-23: 650 mg via ORAL
  Filled 2011-12-23: qty 2

## 2011-12-23 MED ORDER — ADULT MULTIVITAMIN W/MINERALS CH
1.0000 | ORAL_TABLET | Freq: Every day | ORAL | Status: DC
  Administered 2011-12-23 – 2011-12-29 (×6): 1 via ORAL
  Filled 2011-12-23 (×7): qty 1

## 2011-12-23 MED ORDER — ONDANSETRON HCL 4 MG/2ML IJ SOLN
4.0000 mg | INTRAMUSCULAR | Status: AC | PRN
  Administered 2011-12-23 – 2011-12-24 (×2): 4 mg via INTRAVENOUS
  Filled 2011-12-23 (×2): qty 2

## 2011-12-23 MED ORDER — INSULIN ASPART 100 UNIT/ML ~~LOC~~ SOLN: 0.0000 [IU] | Freq: Every day | SUBCUTANEOUS | Status: DC

## 2011-12-23 MED ORDER — PIPERACILLIN-TAZOBACTAM 3.375 G IVPB
3.3750 g | Freq: Three times a day (TID) | INTRAVENOUS | Status: DC
  Administered 2011-12-23 – 2011-12-28 (×14): 3.375 g via INTRAVENOUS
  Filled 2011-12-23 (×16): qty 50

## 2011-12-23 MED ORDER — TEMAZEPAM 15 MG PO CAPS: 15.0000 mg | ORAL_CAPSULE | Freq: Every evening | ORAL | Status: DC | PRN

## 2011-12-23 MED ORDER — FOLIC ACID 1 MG PO TABS
1.0000 mg | ORAL_TABLET | Freq: Every day | ORAL | Status: DC
  Administered 2011-12-23 – 2011-12-29 (×7): 1 mg via ORAL
  Filled 2011-12-23 (×8): qty 1

## 2011-12-23 SURGICAL SUPPLY — 63 items
BANDAGE COBAN STERILE 2 (GAUZE/BANDAGES/DRESSINGS) IMPLANT
BANDAGE CONFORM 2  STR LF (GAUZE/BANDAGES/DRESSINGS) IMPLANT
BANDAGE ELASTIC 3 VELCRO ST LF (GAUZE/BANDAGES/DRESSINGS) ×1 IMPLANT
BANDAGE ELASTIC 4 VELCRO ST LF (GAUZE/BANDAGES/DRESSINGS) ×1 IMPLANT
BANDAGE GAUZE ELAST BULKY 4 IN (GAUZE/BANDAGES/DRESSINGS) ×1 IMPLANT
BNDG CMPR 9X4 STRL LF SNTH (GAUZE/BANDAGES/DRESSINGS)
BNDG COHESIVE 1X5 TAN STRL LF (GAUZE/BANDAGES/DRESSINGS) IMPLANT
BNDG ESMARK 4X9 LF (GAUZE/BANDAGES/DRESSINGS) IMPLANT
CLOTH BEACON ORANGE TIMEOUT ST (SAFETY) ×2 IMPLANT
CORDS BIPOLAR (ELECTRODE) ×2 IMPLANT
COVER SURGICAL LIGHT HANDLE (MISCELLANEOUS) ×2 IMPLANT
DECANTER SPIKE VIAL GLASS SM (MISCELLANEOUS) ×2 IMPLANT
DRAIN PENROSE 1/4X12 LTX STRL (WOUND CARE) IMPLANT
DRSG ADAPTIC 3X8 NADH LF (GAUZE/BANDAGES/DRESSINGS) IMPLANT
DRSG EMULSION OIL 3X3 NADH (GAUZE/BANDAGES/DRESSINGS) ×1 IMPLANT
DRSG PAD ABDOMINAL 8X10 ST (GAUZE/BANDAGES/DRESSINGS) ×2 IMPLANT
GAUZE KERLIX 2  STERILE LF (GAUZE/BANDAGES/DRESSINGS) ×1 IMPLANT
GAUZE PACKING IODOFORM 1/4X5 (PACKING) ×1 IMPLANT
GAUZE SPONGE 4X4 12PLY STRL LF (GAUZE/BANDAGES/DRESSINGS) ×1 IMPLANT
GAUZE SPONGE 4X4 16PLY XRAY LF (GAUZE/BANDAGES/DRESSINGS) ×1 IMPLANT
GAUZE XEROFORM 1X8 LF (GAUZE/BANDAGES/DRESSINGS) ×1 IMPLANT
GAUZE XEROFORM 5X9 LF (GAUZE/BANDAGES/DRESSINGS) ×1 IMPLANT
GLOVE BIO SURGEON STRL SZ7.5 (GLOVE) ×3 IMPLANT
GLOVE BIOGEL PI IND STRL 6.5 (GLOVE) IMPLANT
GLOVE BIOGEL PI IND STRL 7.0 (GLOVE) IMPLANT
GLOVE BIOGEL PI IND STRL 8 (GLOVE) ×1 IMPLANT
GLOVE BIOGEL PI INDICATOR 6.5 (GLOVE) ×1
GLOVE BIOGEL PI INDICATOR 7.0 (GLOVE) ×1
GLOVE BIOGEL PI INDICATOR 8 (GLOVE) ×1
GLOVE ECLIPSE 6.5 STRL STRAW (GLOVE) ×1 IMPLANT
GLOVE SURG SS PI 6.5 STRL IVOR (GLOVE) ×1 IMPLANT
GOWN BRE IMP SLV AUR LG STRL (GOWN DISPOSABLE) ×2 IMPLANT
GOWN STRL REIN XL XLG (GOWN DISPOSABLE) ×2 IMPLANT
HANDPIECE INTERPULSE COAX TIP (DISPOSABLE)
KIT BASIN OR (CUSTOM PROCEDURE TRAY) ×2 IMPLANT
KIT ROOM TURNOVER OR (KITS) ×2 IMPLANT
LOOP VESSEL MAXI BLUE (MISCELLANEOUS) IMPLANT
LOOP VESSEL MINI RED (MISCELLANEOUS) IMPLANT
MANIFOLD NEPTUNE II (INSTRUMENTS) ×2 IMPLANT
NDL HYPO 25X1 1.5 SAFETY (NEEDLE) IMPLANT
NEEDLE HYPO 25X1 1.5 SAFETY (NEEDLE) IMPLANT
NS IRRIG 1000ML POUR BTL (IV SOLUTION) ×2 IMPLANT
PACK ORTHO EXTREMITY (CUSTOM PROCEDURE TRAY) ×2 IMPLANT
PAD ARMBOARD 7.5X6 YLW CONV (MISCELLANEOUS) ×3 IMPLANT
SCRUB BETADINE 4OZ XXX (MISCELLANEOUS) ×2 IMPLANT
SET HNDPC FAN SPRY TIP SCT (DISPOSABLE) IMPLANT
SOLUTION BETADINE 4OZ (MISCELLANEOUS) ×2 IMPLANT
SPONGE GAUZE 4X4 12PLY (GAUZE/BANDAGES/DRESSINGS) ×1 IMPLANT
SPONGE LAP 18X18 X RAY DECT (DISPOSABLE) ×1 IMPLANT
SPONGE LAP 4X18 X RAY DECT (DISPOSABLE) ×2 IMPLANT
SUCTION FRAZIER TIP 10 FR DISP (SUCTIONS) ×2 IMPLANT
SUT ETHILON 4 0 PS 2 18 (SUTURE) ×1 IMPLANT
SUT MON AB 5-0 P3 18 (SUTURE) IMPLANT
SYR CONTROL 10ML LL (SYRINGE) ×1 IMPLANT
TOWEL OR 17X24 6PK STRL BLUE (TOWEL DISPOSABLE) ×2 IMPLANT
TOWEL OR 17X26 10 PK STRL BLUE (TOWEL DISPOSABLE) ×2 IMPLANT
TUBE ANAEROBIC SPECIMEN COL (MISCELLANEOUS) IMPLANT
TUBE CONNECTING 12X1/4 (SUCTIONS) ×2 IMPLANT
TUBE FEEDING 5FR 15 INCH (TUBING) IMPLANT
TUBING CYSTO DISP (UROLOGICAL SUPPLIES) ×1 IMPLANT
UNDERPAD 30X30 INCONTINENT (UNDERPADS AND DIAPERS) ×2 IMPLANT
WATER STERILE IRR 1000ML POUR (IV SOLUTION) ×1 IMPLANT
YANKAUER SUCT BULB TIP NO VENT (SUCTIONS) ×1 IMPLANT

## 2011-12-23 NOTE — ED Notes (Signed)
PT TO COME TO CDU FROM XRAY

## 2011-12-23 NOTE — ED Provider Notes (Signed)
History     CSN: [id number]  Arrival date & time 12/23/11  1014   First MD Initiated Contact with Patient 12/23/11 1023      Chief Complaint  Patient presents with  . Cellulitis    (Consider location/radiation/quality/duration/timing/severity/associated sxs/prior treatment) HPI Comments: Patient presents to the emergency department with a chief complaint of left thumb swelling and redness.  Patient states that this started about a week ago And it has been gradually getting worse.  Patient denies any pain.  Patient reports that she is a insulin controlled diabetic and has a history of multiple finger amputations d/t OM.  Patient currently has no other complaints. Pt has not eaten since last night.  Med HX: Diabetes mellitus type 2, uncontrolled; Hypertension; Anemia; and Alcohol dependency Previous finger amputations by Dr. Brigid Re   The history is provided by the patient.    Past Medical History  Diagnosis Date  . Diabetes mellitus type 2, uncontrolled   . Hypertension   . Anemia   . Alcohol dependency     Past Surgical History  Procedure Date  . Amputation     right first and left middle finger amputation 2/2 OM 08/2011 by Dr. Amedeo Plenty  . Cesarean section     Family History  Problem Relation Age of Onset  . Diabetes Mother   . Diabetes Father     History  Substance Use Topics  . Smoking status: Never Smoker   . Smokeless tobacco: Not on file  . Alcohol Use: Yes     h/o alcohol abuse    OB History    Grav Para Term Preterm Abortions TAB SAB Ect Mult Living                  Review of Systems  Constitutional: Negative for fever, chills and appetite change.  HENT: Negative for congestion.   Eyes: Negative for visual disturbance.  Respiratory: Negative for shortness of breath.   Cardiovascular: Negative for chest pain and leg swelling.  Gastrointestinal: Negative for abdominal pain.  Genitourinary: Negative for dysuria, urgency and frequency.    Musculoskeletal: Positive for joint swelling (Left MCP thumb).  Neurological: Negative for dizziness, syncope, weakness, light-headedness, numbness and headaches.  Psychiatric/Behavioral: Negative for confusion.  All other systems reviewed and are negative.    Allergies  Review of patient's allergies indicates no known allergies.  Home Medications   Current Outpatient Rx  Name Route Sig Dispense Refill  . AMLODIPINE BESYLATE 10 MG PO TABS Oral Take 10 mg by mouth daily.    [name] FERROUS SULFATE 325 (65 FE) MG PO TABS Oral Take 325 mg by mouth 2 (two) times daily with a meal.     . FOLIC ACID 1 MG PO TABS Oral Take 1 mg by mouth daily.     . INSULIN ASPART 100 UNIT/ML [place] SOLN Subcutaneous Inject 18 Units into the skin 2 (two) times daily.      BP 191/97  Pulse 98  Temp(Src) 98.4 F (36.9 C) (Oral)  Resp 20  SpO2 99%  LMP 11/22/2011  Physical Exam  Vitals reviewed. Constitutional: She is oriented to person, place, and time. She appears well-developed and well-nourished. No distress.       Slightly febrile 99.7   HENT:  Head: Normocephalic and atraumatic.  Mouth/Throat: No oropharyngeal exudate.  Eyes: Conjunctivae and EOM are normal. No scleral icterus.  Neck: Normal range of motion. Neck supple.  Cardiovascular: Regular rhythm, normal heart sounds and intact distal pulses.  Tachycardic HR 105  Pulmonary/Chest: Effort normal and breath sounds normal.  Musculoskeletal: Normal range of motion. She exhibits no edema and no tenderness.       Left hand: She exhibits swelling. She exhibits normal range of motion, no tenderness and no bony tenderness. Normal strength noted. She exhibits no finger abduction, no thumb/finger opposition and no wrist extension trouble.       Hands:      See Skin.   Full range of motion of left wrist  No tenderness to palpation. Right first & left middle finger amputation (Dr. Amedeo Plenty 08/2011) no ROM of DIP or PIP of left thumb.   Neurological: She  is alert and oriented to person, place, and time. Coordination normal.  Skin: Skin is warm and dry. Rash noted. She is not diaphoretic. There is erythema. No pallor.          Swelling erythema and warmth of the left thumb from the distal phalanx through to the thenar area.  No range of motion of the DIP PIP of left thumb. thumb/finger opposition intact.  Patient denies tenderness to palpation.  Palmar surface of the thumb is fluctuant on palpation.  Molted discoloration of the distal phalanx, appears necrotic  Multiple ulcerated scars on lower extremities bilaterally. Distal pulses intact. Open ulcer located on lateral right shin/calf.  Psychiatric: She has a normal mood and affect. Her behavior is normal.    ED Course  Procedures (including critical care time)  Labs Reviewed  CBC - Abnormal; Notable for the following:    WBC 16.8 (*)    Hemoglobin 11.0 (*)    HCT 34.6 (*)    MCV 75.9 (*)    MCH 24.1 (*)    All other components within normal limits  DIFFERENTIAL - Abnormal; Notable for the following:    Neutrophils Relative 85 (*)    Neutro Abs 14.3 (*)    Lymphocytes Relative 7 (*)    Monocytes Absolute 1.2 (*)    All other components within normal limits  BASIC METABOLIC PANEL - Abnormal; Notable for the following:    Sodium 133 (*)    Potassium 5.3 (*)    Glucose, Bld 363 (*)    All other components within normal limits  CULTURE, BLOOD (ROUTINE X 2)  CULTURE, BLOOD (ROUTINE X 2)   Dg Hand Complete Left  12/23/2011  *RADIOLOGY REPORT*  Clinical Data: Pain and swelling; diabetes  LEFT HAND - COMPLETE 3+ VIEW  Comparison: August 18, 2011  Findings: There is a large amount of gas within the soft tissues of the left thumb.  There is no evidence of osseous erosion or periosteal reaction to suggest osteomyelitis at this time.  There has been amputation of the third distal phalanx.  IMPRESSION: Marked  subcutaneous emphysema involving the left thumb which is consistent with  cellulitis from a gas-forming organism.  Original Report Authenticated By: Duayne Cal, M.D.     No diagnosis found.  Patient has been started on ankle myosin and Zosyn d/t being an uncontrolled DM and history of MRSA.  X-ray of the hand pending to rule out osteomyelitis.  Patient being moved to CDU and placed on cellulitis protocol.  This patient has been discussed with Dr. Stark Jock who agrees with my plan & will also see the patient.  Patient's results discussed with Dr. Fredna Dow who will be coming to the emergency department to evaluate patient for surgery. Pt is NPO and has not eaten since last night. Spoke with teaching  service who will be admitting pt for Dr. Fredna Dow.   MDM  Cellulitis/ OM         Verl Dicker, PA-C 12/23/11 1337

## 2011-12-23 NOTE — ED Notes (Signed)
PT TO OR.

## 2011-12-23 NOTE — ED Notes (Signed)
Patient is being admitted to hospital and per pa will go to or today. Pt is aware of plan of care

## 2011-12-23 NOTE — ED Notes (Signed)
Pt states swelling in left thumb began about a week ago and has just gradually been getting worse.

## 2011-12-23 NOTE — ED Notes (Signed)
DR Fredna Dow TO BEDSIDE

## 2011-12-23 NOTE — H&P (Signed)
Hospital Admission Note Date: 12/23/2011  Patient name: [name] Medical record number: [id number] Date of birth: 04-26-1966 Age: 46 y.o. Gender: female PCP: No primary provider on file.  Attending:  Dr. Orene Desanctis  First Contact: Dr. Blaine Hamper Pager: [phone] Second Contact:Dr. Posey Pronto [phone]  After 5 pm or weekends: 1st Contact: Pager: [phone] 2nd Contact: Pager: [phone]   Chief Complaint: left thumb infection   History of Present Illness:  Patient is 46 yo lady with PMH of DM-II (A1c 13.9 on 08/19/11), HTN and alcohol abuse, who presents with left thumb pain, swelling for a week.   Per patient, her left thumb began to swell and become red 1 week ago, with mild pain. She has fevers (did not measure body temperature) and chills at night prior to admission. Did not seek for treatment. She tried tylenol which relived her pain partially, without any help for swelling. The problem is progressively getting worse. No previous problems in left thumb. Does not remember any wounds or injury to left thumb.  Of note, patient has had previous right index finger amputations due to infection 5 months ago. When I saw patient, she already had I & D of left thumb by Dr. Leanora Cover. Her left thumb was dressed. Patient did not pain after surgery.  Denies headaches,  cough, chest pain, SOB,  abdominal pain,diarrhea, constipation, dysuria, urgency, frequency, hematuria, joint pain.  PMH:  Diabetes mellitus type 2, uncontrolled. A1c was 13.9 on 9/13.    Hypertension    Alcohol abuse        Medications Prior to Admission (Home Medications)   Medication Sig Dispense Refill  . amLODipine (NORVASC) 10 MG tablet Take 10 mg by mouth daily.      . ferrous sulfate 325 (65 FE) MG tablet Take 325 mg by mouth 2 (two) times daily with a meal.       . folic acid (FOLVITE) 1 MG tablet  Novolog 18 U twice  Daily  Ferrous sulfate 325 twice daily Take 1 mg by mouth daily.          Meds: Medications Prior to  Admission  Medication Dose Route Frequency Provider Last Rate Last Dose  . acetaminophen (TYLENOL) tablet 650 mg  650 mg Oral Once [employer]	[place], PA-C   650 mg at 12/23/11 1055  . amLODipine (NORVASC) tablet 10 mg  10 mg Oral Daily Tennis Must, MD   10 mg at 12/23/11 2127  . diphenhydrAMINE (BENADRYL) 12.5 MG/5ML elixir 12.5-25 mg  12.5-25 mg Oral Q4H PRN Tennis Must, MD      . ferrous sulfate tablet 325 mg  325 mg Oral BID WC Tennis Must, MD   325 mg at [id number]  . folic acid (FOLVITE) tablet 1 mg  1 mg Oral Daily Tennis Must, MD   1 mg at 12/23/11 2127  . HYDROmorphone (DILAUDID) injection 0.5-1 mg  0.5-1 mg Intravenous Q2H PRN Tennis Must, MD      . insulin aspart (novoLOG) 100 UNIT/ML injection           . insulin aspart (novoLOG) injection 0-5 Units  0-5 Units Subcutaneous QHS Criss Alvine, MD      . insulin aspart (novoLOG) injection 10 Units  10 Units Intravenous Once Lisette Paz, PA-C   10 Units at 12/23/11 1333  . insulin aspart (novoLOG) injection 18 Units  18 Units Subcutaneous BID Tennis Must, MD      . insulin aspart (novoLOG) injection 5 Units  5 Units Subcutaneous Once Janeece Riggers, MD   5 Units at 12/23/11 1709  . insulin glargine (LANTUS) injection 15 Units  15 Units Subcutaneous QHS Criss Alvine, MD      . LORazepam (ATIVAN) tablet 1 mg  1 mg Oral Q6H PRN Ivor Costa, MD       Or  . LORazepam (ATIVAN) injection 1 mg  1 mg Intravenous Q6H PRN Ivor Costa, MD      . methocarbamol (ROBAXIN) tablet 500 mg  500 mg Oral Q6H PRN Tennis Must, MD       Or  . methocarbamol (ROBAXIN) 500 mg in dextrose 5 % 50 mL IVPB  500 mg Intravenous Q6H PRN Tennis Must, MD      . metoCLOPramide (REGLAN) tablet 5-10 mg  5-10 mg Oral Q8H PRN Tennis Must, MD       Or  . metoCLOPramide (REGLAN) injection 5-10 mg  5-10 mg Intravenous Q8H PRN Tennis Must, MD      . morphine 4 MG/ML injection 4 mg  4 mg Intravenous Once Lisette Paz, PA-C   4 mg at 12/23/11 1251  . mulitivitamin with  minerals tablet 1 tablet  1 tablet Oral Daily Ivor Costa, MD   1 tablet at 12/23/11 2037  . ondansetron (ZOFRAN) injection 4 mg  4 mg Intravenous PRN Lisette Paz, PA-C   4 mg at 12/23/11 1252  . ondansetron (ZOFRAN) tablet 4 mg  4 mg Oral Q6H PRN Tennis Must, MD       Or  . ondansetron [facility]) injection 4 mg  4 mg Intravenous Q6H PRN Tennis Must, MD      . oxyCODONE-acetaminophen (PERCOCET) 5-325 MG per tablet 1-2 tablet  1-2 tablet Oral Q4H PRN Tennis Must, MD   2 tablet at 12/23/11 2038  . piperacillin-tazobactam (ZOSYN) IVPB 3.375 g  3.375 g Intravenous Q8H Tennis Must, MD   3.375 g at 12/23/11 2059  . piperacillin-tazobactam (ZOSYN) IVPB 4.5 g  4.5 g Intravenous Once Lisette Paz, PA-C   4.5 g at 12/23/11 1236  . sodium chloride 0.9 % bolus 500 mL  500 mL Intravenous STAT Lisette Paz, PA-C   500 mL at 12/23/11 1056  . thiamine (VITAMIN B-1) tablet 100 mg  100 mg Oral Daily Ivor Costa, MD   100 mg at 12/23/11 2038   Or  . thiamine (B-1) injection 100 mg  100 mg Intravenous Daily Ivor Costa, MD      . vancomycin (VANCOCIN) IVPB 1000 mg/200 mL premix  1,000 mg Intravenous Once Lisette Paz, PA-C   1,000 mg at 12/23/11 1121  . vancomycin (VANCOCIN) IVPB 1000 mg/200 mL premix  1,000 mg Intravenous Q8H Tennis Must, MD   1,000 mg at 12/23/11 2059  . DISCONTD: amLODipine (NORVASC) tablet 10 mg  10 mg Oral Daily Tennis Must, MD      . DISCONTD: bupivacaine (MARCAINE) 0.25 % injection    PRN Tennis Must, MD   10 mL at 12/23/11 1625  . DISCONTD: fentaNYL (SUBLIMAZE) injection    PRN Barrington Ellison, CRNA   50 mcg at 12/23/11 1618  . DISCONTD: glycopyrrolate (ROBINUL) injection    PRN Barrington Ellison, CRNA   0.7 mg at 12/23/11 1627  . DISCONTD: HYDROmorphone (DILAUDID) injection 0.25-0.5 mg  0.25-0.5 mg Intravenous Q5 min PRN Janeece Riggers, MD      . DISCONTD: insulin aspart (novoLOG) injection 10 Units  10 Units Subcutaneous  Once Janeece Riggers, MD      . DISCONTD: lactated ringers  infusion    Continuous PRN Barrington Ellison, CRNA      . DISCONTD: lidocaine (cardiac) 100 mg/63ml (XYLOCAINE) 20 MG/ML injection 2%    PRN Barrington Ellison, CRNA   40 mg at 12/23/11 1514  . DISCONTD: midazolam (VERSED) 5 MG/5ML injection    PRN Barrington Ellison, CRNA   2 mg at 12/23/11 1503  . DISCONTD: neostigmine (PROSTIGMINE) injection   Intravenous PRN Barrington Ellison, CRNA   4 mg at 12/23/11 1627  . DISCONTD: ondansetron (ZOFRAN) injection 4 mg  4 mg Intravenous Once PRN Janeece Riggers, MD      . DISCONTD: ondansetron [facility]) injection    PRN Barrington Ellison, CRNA   4 mg at 12/23/11 1620  . DISCONTD: propofol (DIPRIVAN) 10 MG/ML infusion    PRN Barrington Ellison, CRNA   140 mg at 12/23/11 1514  . DISCONTD: rocuronium (ZEMURON) injection    PRN Barrington Ellison, CRNA   35 mg at 12/23/11 1514  . DISCONTD: sodium chloride irrigation 0.9 %    PRN Tennis Must, MD   3,000 mL at 12/23/11 1556  . DISCONTD: temazepam (RESTORIL) capsule 15-30 mg  15-30 mg Oral QHS PRN Tennis Must, MD          Medications Prior to Admission  Medication Sig Dispense Refill  . amLODipine (NORVASC) 10 MG tablet Take 10 mg by mouth daily.      . ferrous sulfate 325 (65 FE) MG tablet Take 325 mg by mouth 2 (two) times daily with a meal.       . folic acid (FOLVITE) 1 MG tablet Take 1 mg by mouth daily.         Allergies: NKDA  Past Surgical History  Procedure Date  . Amputation     right index finger amputation 2/2 OM 08/2011 by Dr. Amedeo Plenty  . Cesarean section   . I&d left thumb 12/23/2011  . Tubal ligation    Family medical Hx: both parents had DM and HTN. Monther died at age of 64.  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 Drinks 80 ounce of beer/day, last drinking was yesterday.    Review of Systems: per HPI   Physical Exam:  Vitals: T: 97.9     HR: 93      BP:  138/85     RR: 20       O2 saturation:  100% at  RA.  BW 179 LB,  last menstrual period 11/22/2011,  General: resting in bed, not in acute distress HEENT: PERRL, EOMI, no scleral icterus Cardiac: S1/S2, RRR, No murmurs, gallops or rubs Pulm: Good air movement bilaterally, Clear to auscultation bilaterally, No rales, wheezing, rhonchi or rubs. Abd: Soft,  nondistended, nontender, no rebound pain, no organomegaly, BS present Ext: No rashes or edema, 1+DP/PT pulse bilaterally. Right index finger amputation. Left thumb is dressed without draining.   Patient legs have several skin lesions. Left anterior leg has two small skin lesions which have denuded skin and look like infected.  Her right leg has 2 X 2 cm skin lesion on the lateral side of leg, which look like infected. There is pus on the surface of the skin lesion. Patient reports no pain over the lesions.  Neuro: alert and oriented X3, cranial  nerves II-XII grossly intact, muscle strength 5/5 in all extremeties,  sensation to light touch intact.   Lab results: Basic Metabolic Panel:  Basename 12/23/11 1134  NA 133*  K 5.3*  CL 96  CO2 23  GLUCOSE 363*  BUN 14  CREATININE 0.79  CALCIUM 9.4  MG --  PHOS --   CBC:  Basename 12/23/11 1134  WBC 16.8*  NEUTROABS 14.3*  HGB 11.0*  HCT 34.6*  MCV 75.9*  PLT 359   CBG:  Basename 12/23/11 1759 12/23/11 1642 12/23/11 1444  GLUCAP 222* 209* 285*     Imaging results:   Dg Hand Complete Left  12/23/2011  *RADIOLOGY REPORT*  Clinical Data: Pain and swelling; diabetes  LEFT HAND - COMPLETE 3+ VIEW  Comparison: August 18, 2011  Findings: There is a large amount of gas within the soft tissues of the left thumb.  There is no evidence of osseous erosion or periosteal reaction to suggest osteomyelitis at this time.  There has been amputation of the third distal phalanx.    IMPRESSION: Marked  subcutaneous emphysema involving the left thumb which is consistent with cellulitis from a gas-forming organism.  Original Report  Authenticated By: Duayne Cal, M.D.   Assessment & Plan by Problem:    # Necrotizing fasciitis: patient's swelling thumb with redness and tenderness is most likely casued by necrotizing fasciitis.  X-ray showed that there is no evidence of ossteous erosion or periosteal reaction to suggest osteomyelitis at this time. But with marked  subcutaneous emphysema involving the left thumb which is consistent with cellulitis from a gas-forming organism.  -Consulted orthopedic surgeon. Appreciate  Orthopedic's help in managing our patient. Dr Fredna Dow saw patient and did IRRIGATION AND DEBRIDEMENT. Will do I & D tomorrow again -started IV zosyn and Vancomycin -pain control with percocet and tylenol -will repeat CBC  # DM-II:  Poorly controlled. Recent A1c is 13.9. Patient supposes to use Novolog 18 U twice daily.   -will check A1c -Start Lantus 15 U daily and SSI-moderate sensitivity  # HTN: Bp is 138/85. Will continue her home dose amlodipine 10 mg daily.  # Alcohol abuse: Drinks 80 ounce of beer/day, last drinking was yesterday. Currently no withdraw symptoms.  -will start CIWA protocol -will give VB1 and folate  # DVT PPX: SCD   Signed: Ivor Costa 12/23/2011, 9:41 PM  Internal Medicine Teaching Service Attending Note Date: 12/24/2011  Patient name: [name]  Medical record number: [id number]  Date of birth: 11/20/1966   I have seen and evaluated [name] and discussed their care with the Residency Team.    Physical Exam: Blood pressure 132/70, pulse 85, temperature 98.7 F (37.1 C), temperature source Oral, resp. rate 20, height 5' 6.93" (1.7 m), weight 179 lb 14.3 oz (81.6 kg), last menstrual period 11/22/2011, SpO2 95.00%.   Lab results: Results for orders placed during the hospital encounter of 12/23/11 (from the past 24 hour(s))  GLUCOSE, CAPILLARY     Status: Abnormal   Collection Time   12/23/11  2:44 PM      Component Value Range   Glucose-Capillary 285 (*) 70  - 99 (mg/dL)  ANAEROBIC CULTURE     Status: Normal (Preliminary result)   Collection Time   12/23/11  3:38 PM      Component Value Range   Specimen Description ABSCESS THUMB LEFT     Special Requests NONE     Gram Stain       Value: ABUNDANT WBC PRESENT,BOTH PMN  AND MONONUCLEAR     ABUNDANT GRAM POSITIVE COCCI     IN PAIRS IN CLUSTERS Performed at [facility] Gram Stain Report Called to,Read Back By and Verified With: Gram Stain Report Called to,Read Back By and Verified With: DR HEWITT [id number] 12/23/11 A BROWNING   Culture       Value: NO ANAEROBES ISOLATED; CULTURE IN PROGRESS FOR 5 DAYS   Report Status PENDING    CULTURE, ROUTINE-ABSCESS     Status: Normal (Preliminary result)   Collection Time   12/23/11  3:38 PM      Component Value Range   Specimen Description ABSCESS THUMB LEFT     Special Requests NONE     Gram Stain       Value: ABUNDANT WBC PRESENT,BOTH PMN AND MONONUCLEAR     ABUNDANT GRAM POSITIVE COCCI     IN PAIRS IN CLUSTERS Performed at [facility] Gram Stain Report Called to,Read Back By and Verified With: Gram Stain Report Called to,Read Back By and Verified With: DR HEWITT [id number] 12/23/11 A BROWNING   Culture NO GROWTH     Report Status PENDING    GRAM STAIN     Status: Normal   Collection Time   12/23/11  3:38 PM      Component Value Range   Specimen Description ABSCESS THUMB LEFT     Special Requests NONE     Gram Stain       Value: ABUNDANT WBC PRESENT,BOTH PMN AND MONONUCLEAR     ABUNDANT GRAM POSITIVE COCCI IN CLUSTERS IN PAIRS     Gram Stain Report Called to,Read Back By and Verified With: Dr Doran Durand [id number] 12/23/11 A Browning   Report Status 12/23/2011 FINAL    ANAEROBIC CULTURE     Status: Normal (Preliminary result)   Collection Time   12/23/11  3:46 PM      Component Value Range   Specimen Description ABSCESS THUMB LEFT     Special Requests NONE     Gram Stain       Value: NO WBC SEEN     ABUNDANT SQUAMOUS EPITHELIAL CELLS PRESENT      ABUNDANT GRAM POSITIVE COCCI     IN PAIRS ABUNDANT GRAM POSITIVE RODS     ABUNDANT GRAM NEGATIVE RODS   Culture       Value: NO ANAEROBES ISOLATED; CULTURE IN PROGRESS FOR 5 DAYS   Report Status PENDING    CULTURE, ROUTINE-ABSCESS     Status: Normal (Preliminary result)   Collection Time   12/23/11  3:46 PM      Component Value Range   Specimen Description ABSCESS THUMB LEFT     Special Requests NONE     Gram Stain       Value: NO WBC SEEN     ABUNDANT SQUAMOUS EPITHELIAL CELLS PRESENT     ABUNDANT GRAM POSITIVE COCCI     IN PAIRS ABUNDANT GRAM POSITIVE RODS     FEW GRAM NEGATIVE RODS   Culture NO GROWTH     Report Status PENDING    GLUCOSE, CAPILLARY     Status: Abnormal   Collection Time   12/23/11  4:42 PM      Component Value Range   Glucose-Capillary 209 (*) 70 - 99 (mg/dL)  GLUCOSE, CAPILLARY     Status: Abnormal   Collection Time   12/23/11  5:59 PM      Component Value Range   Glucose-Capillary 222 (*) 70 - 99 (mg/dL)  Comment 1 Notify RN    HEMOGLOBIN A1C     Status: Abnormal   Collection Time   12/23/11  7:30 PM      Component Value Range   Hemoglobin A1C 12.3 (*) <5.7 (%)   Mean Plasma Glucose 306 (*) <117 (mg/dL)  GLUCOSE, CAPILLARY     Status: Abnormal   Collection Time   12/23/11 10:06 PM      Component Value Range   Glucose-Capillary 193 (*) 70 - 99 (mg/dL)  CBC     Status: Abnormal   Collection Time   12/24/11  5:25 AM      Component Value Range   WBC 17.8 (*) 4.0 - 10.5 (K/uL)   RBC 3.69 (*) 3.87 - 5.11 (MIL/uL)   Hemoglobin 8.9 (*) 12.0 - 15.0 (g/dL)   HCT 28.2 (*) 36.0 - 46.0 (%)   MCV 76.4 (*) 78.0 - 100.0 (fL)   MCH 24.1 (*) 26.0 - 34.0 (pg)   MCHC 31.6  30.0 - 36.0 (g/dL)   RDW 12.8  11.5 - 15.5 (%)   Platelets 333  150 - 400 (K/uL)  GLUCOSE, CAPILLARY     Status: Abnormal   Collection Time   12/24/11  8:06 AM      Component Value Range   Glucose-Capillary 253 (*) 70 - 99 (mg/dL)   Comment 1 Notify RN    GLUCOSE, CAPILLARY     Status:  Abnormal   Collection Time   12/24/11 12:21 PM      Component Value Range   Glucose-Capillary 250 (*) 70 - 99 (mg/dL)   Comment 1 Notify RN      Imaging results:  Dg Hand Complete Left  12/23/2011  *RADIOLOGY REPORT*  Clinical Data: Pain and swelling; diabetes  LEFT HAND - COMPLETE 3+ VIEW  Comparison: August 18, 2011  Findings: There is a large amount of gas within the soft tissues of the left thumb.  There is no evidence of osseous erosion or periosteal reaction to suggest osteomyelitis at this time.  There has been amputation of the third distal phalanx.  IMPRESSION: Marked  subcutaneous emphysema involving the left thumb which is consistent with cellulitis from a gas-forming organism.  Original Report Authenticated By: Duayne Cal, M.D.    Assessment and Plan: I agree with the formulated Assessment and Plan with the following changes:Would cover with vancomycin and pip-tazobactam until aerobic cultures help guide. If no S. Aureus is recovered by Sunday 1/120 then d/c vancomycin and continue pip-tazobactam(Zosyn). Lars Mage

## 2011-12-23 NOTE — Anesthesia Preprocedure Evaluation (Addendum)
Anesthesia Evaluation  Patient identified by MRN, date of birth, ID band Patient awake    Reviewed: Allergy & Precautions, H&P , NPO status , Patient's Chart, lab work & pertinent test results  Airway Mallampati: II TM Distance: >3 FB   Mouth opening: Limited Mouth Opening  Dental  (+) Edentulous Upper and Edentulous Lower   Pulmonary  clear to auscultation  Pulmonary exam normal       Cardiovascular hypertension, Pt. on medications Regular Normal    Neuro/Psych    GI/Hepatic negative GI ROS, Neg liver ROS, ETOH Abuse HX   Endo/Other  Diabetes mellitus-, Type 2, Insulin Dependent  Renal/GU Renal InsufficiencyRenal disease     Musculoskeletal negative musculoskeletal ROS (+)   Abdominal   Peds  Hematology negative hematology ROS (+)   Anesthesia Other Findings   Reproductive/Obstetrics negative OB ROS                         Anesthesia Physical Anesthesia Plan  ASA: III and Emergent  Anesthesia Plan: General   Post-op Pain Management:    Induction: Intravenous  Airway Management Planned: Oral ETT  Additional Equipment:   Intra-op Plan:   Post-operative Plan: Extubation in OR  Informed Consent: I have reviewed the patients History and Physical, chart, labs and discussed the procedure including the risks, benefits and alternatives for the proposed anesthesia with the patient or authorized representative who has indicated his/her understanding and acceptance.     Plan Discussed with:   Anesthesia Plan Comments: (Type 2 DM glucose 265 Htn Infection L. Hand H/O ETOH abuse Plan Ga with ETT. )        Anesthesia Quick Evaluation

## 2011-12-23 NOTE — ED Notes (Signed)
PT MEDICATED FOR PAIN. AWAITING XRAY RESULTS

## 2011-12-23 NOTE — Progress Notes (Signed)
5 units novolog given per dr. Linna Caprice

## 2011-12-23 NOTE — H&P (Signed)
[name] is an 46 y.o. female.   Chief Complaint: left thumb infection HPI: 46 yo rhd female states her left thumb began to swell and become red 1 week ago.  Some pain.  Fevers, chills last night.  Has had previous distal left long and right index finger amputations due to infection.  No previous problems in left thumb.  Does not remember any wounds or injury to left thumb.  Past Medical History  Diagnosis Date  . Diabetes mellitus type 2, uncontrolled   . Hypertension   . Anemia   . Alcohol dependency     Past Surgical History  Procedure Date  . Amputation     right first and left middle finger amputation 2/2 OM 08/2011 by Dr. Amedeo Plenty  . Cesarean section     Family History  Problem Relation Age of Onset  . Diabetes Mother   . Diabetes Father    Social History:  reports that she has never smoked. She does not have any smokeless tobacco history on file. She reports that she drinks alcohol. She reports that she does not use illicit drugs.  Allergies: No Known Allergies  Medications Prior to Admission  Medication Dose Route Frequency Provider Last Rate Last Dose  . acetaminophen (TYLENOL) tablet 650 mg  650 mg Oral Once [employer]	[place], PA-C   650 mg at 12/23/11 1055  . insulin aspart (novoLOG) injection 10 Units  10 Units Intravenous Once [employer]	[place], PA-C   10 Units at 12/23/11 1333  . morphine 4 MG/ML injection 4 mg  4 mg Intravenous Once Lisette Paz, PA-C   4 mg at 12/23/11 1251  . ondansetron (ZOFRAN) injection 4 mg  4 mg Intravenous PRN Lisette Paz, PA-C   4 mg at 12/23/11 1252  . piperacillin-tazobactam (ZOSYN) IVPB 4.5 g  4.5 g Intravenous Once Lisette Paz, PA-C   4.5 g at 12/23/11 1236  . sodium chloride 0.9 % bolus 500 mL  500 mL Intravenous STAT Lisette Paz, PA-C   500 mL at 12/23/11 1056  . vancomycin (VANCOCIN) IVPB 1000 mg/200 mL premix  1,000 mg Intravenous Once Lisette Paz, PA-C   1,000 mg at 12/23/11 1121   Medications Prior to Admission  Medication Sig Dispense  Refill  . amLODipine (NORVASC) 10 MG tablet Take 10 mg by mouth daily.      . ferrous sulfate 325 (65 FE) MG tablet Take 325 mg by mouth 2 (two) times daily with a meal.       . folic acid (FOLVITE) 1 MG tablet Take 1 mg by mouth daily.         Results for orders placed during the hospital encounter of 12/23/11 (from the past 48 hour(s))  CBC     Status: Abnormal   Collection Time   12/23/11 11:34 AM      Component Value Range Comment   WBC 16.8 (*) 4.0 - 10.5 (K/uL)    RBC 4.56  3.87 - 5.11 (MIL/uL)    Hemoglobin 11.0 (*) 12.0 - 15.0 (g/dL)    HCT 34.6 (*) 36.0 - 46.0 (%)    MCV 75.9 (*) 78.0 - 100.0 (fL)    MCH 24.1 (*) 26.0 - 34.0 (pg)    MCHC 31.8  30.0 - 36.0 (g/dL)    RDW 12.8  11.5 - 15.5 (%)    Platelets 359  150 - 400 (K/uL)   DIFFERENTIAL     Status: Abnormal   Collection Time   12/23/11 11:34 AM  Component Value Range Comment   Neutrophils Relative 85 (*) 43 - 77 (%)    Neutro Abs 14.3 (*) 1.7 - 7.7 (K/uL)    Lymphocytes Relative 7 (*) 12 - 46 (%)    Lymphs Abs 1.2  0.7 - 4.0 (K/uL)    Monocytes Relative 7  3 - 12 (%)    Monocytes Absolute 1.2 (*) 0.1 - 1.0 (K/uL)    Eosinophils Relative 1  0 - 5 (%)    Eosinophils Absolute 0.1  0.0 - 0.7 (K/uL)    Basophils Relative 0  0 - 1 (%)    Basophils Absolute 0.0  0.0 - 0.1 (K/uL)   BASIC METABOLIC PANEL     Status: Abnormal   Collection Time   12/23/11 11:34 AM      Component Value Range Comment   Sodium 133 (*) 135 - 145 (mEq/L)    Potassium 5.3 (*) 3.5 - 5.1 (mEq/L)    Chloride 96  96 - 112 (mEq/L)    CO2 23  19 - 32 (mEq/L)    Glucose, Bld 363 (*) 70 - 99 (mg/dL)    BUN 14  6 - 23 (mg/dL)    Creatinine, Ser 0.79  0.50 - 1.10 (mg/dL)    Calcium 9.4  8.4 - 10.5 (mg/dL)    GFR calc non Af Amer >90  >90 (mL/min)    GFR calc Af Amer >90  >90 (mL/min)     Dg Hand Complete Left  12/23/2011  *RADIOLOGY REPORT*  Clinical Data: Pain and swelling; diabetes  LEFT HAND - COMPLETE 3+ VIEW  Comparison: August 18, 2011   Findings: There is a large amount of gas within the soft tissues of the left thumb.  There is no evidence of osseous erosion or periosteal reaction to suggest osteomyelitis at this time.  There has been amputation of the third distal phalanx.  IMPRESSION: Marked  subcutaneous emphysema involving the left thumb which is consistent with cellulitis from a gas-forming organism.  Original Report Authenticated By: Duayne Cal, M.D.     A comprehensive review of systems was negative except for: Constitutional: positive for chills and fevers  Blood pressure 152/95, pulse 105, temperature 99.3 F (37.4 C), temperature source Oral, resp. rate 20, last menstrual period 11/22/2011, SpO2 97.00%.  General appearance: alert, cooperative, appears stated age and no distress Head: Normocephalic, without obvious abnormality, atraumatic Neck: supple, symmetrical, trachea midline Resp: clear to auscultation bilaterally Cardio: regular rate and rhythm GI: soft, non-tender; bowel sounds normal; no masses,  no organomegaly Extremities: right hand intact to light touch sensation and capillar refill.  healed index amputation. left hand intact sensation and capillary refill.  healed long finger amputation.  thumb swollen, erythematous.  erythema in thenar eminence.  no ttp in hypothenar eminence or in other digits.  no pain in palm, wrist, or forearm.    Multiple dry ulcers on the fingers. Pulses: 2+ and symmetric Skin: as above Neurologic: Grossly normal Incision/Wound: As above.  Assessment/Plan Left thumb infection with subcutaneous air on xr.  Discussed with patient and husband.  Recommend OR for emergent I&D of thumb and possibly hand, wrist, forearm as needed.  Discussed that this could be a necrotizing fasciitis and may require multiple debridements or even amputation of the thumb, hand, or arm to control infection.  Also discussed potential for mortality from this type of infection.  Risks, benefits,  alternatives discussed including risks of blood loss, infection, heart attack, death, damage to nerves, vessels, tendon,  ligament, bone, failure of surgery, need for further surgery, complications with wound healing.  They voiced understanding and agreed with plan of care.  Desjuan Stearns R 12/23/2011, 2:20 PM

## 2011-12-23 NOTE — Anesthesia Postprocedure Evaluation (Signed)
  Anesthesia Post-op Note  Patient: [name]  Procedure(s) Performed:  IRRIGATION AND DEBRIDEMENT EXTREMITY - I&D Left thumb, hand and arm  Patient Location: PACU  Anesthesia Type: General  Level of Consciousness: awake, alert  and oriented  Airway and Oxygen Therapy: Patient Spontanous Breathing and Patient connected to nasal cannula oxygen  Post-op Pain: mild  Post-op Assessment: Post-op Vital signs reviewed and Patient's Cardiovascular Status Stable  Post-op Vital Signs: stable  Complications: No apparent anesthesia complications

## 2011-12-23 NOTE — Op Note (Signed)
Dictation [id number]

## 2011-12-23 NOTE — Brief Op Note (Signed)
12/23/2011  4:32 PM  PATIENT:  [name]  46 y.o. female  PRE-OPERATIVE DIAGNOSIS:  necrotizining fascitis  POST-OPERATIVE DIAGNOSIS:  * No post-op diagnosis entered *  PROCEDURE:  Procedure(s): IRRIGATION AND DEBRIDEMENT EXTREMITY  SURGEON:  Surgeon(s): Tennis Must, MD  PHYSICIAN ASSISTANT:   ASSISTANTS: none   ANESTHESIA:   general  EBL:  Total I/O In: 1000 [I.V.:1000] Out: -   BLOOD ADMINISTERED:none  DRAINS: iodoform packing  LOCAL MEDICATIONS USED:  MARCAINE 10 CC  SPECIMEN:  Source of Specimen:  left thumb  DISPOSITION OF SPECIMEN:  micro  COUNTS:  YES  TOURNIQUET:   Total Tourniquet Time Documented: Upper Arm (Left) - 57 minutes  DICTATION: .Other Dictation: Dictation Number [id number]  PLAN OF CARE: Admit to inpatient   PATIENT DISPOSITION:  PACU - hemodynamically stable.   Delay start of Pharmacological VTE agent (>24hrs) due to surgical blood loss or risk of bleeding:  Yes for return to OR tomorrow.

## 2011-12-23 NOTE — Transfer of Care (Signed)
Immediate Anesthesia Transfer of Care Note  Patient: [name]  Procedure(s) Performed:  IRRIGATION AND DEBRIDEMENT EXTREMITY - I&D Left thumb, hand and arm  Patient Location: PACU  Anesthesia Type: General  Level of Consciousness: awake, alert  and oriented  Airway & Oxygen Therapy: Patient Spontanous Breathing and Patient connected to nasal cannula oxygen  Post-op Assessment: Report given to PACU RN  Post vital signs: Reviewed and stable Filed Vitals:   12/23/11 1416  BP: 152/95  Pulse:   Temp:   Resp:     Complications: No apparent anesthesia complications

## 2011-12-23 NOTE — Preoperative (Signed)
Beta Blockers   Reason not to administer Beta Blockers:Not Applicable 

## 2011-12-23 NOTE — Progress Notes (Signed)
ANTIBIOTIC CONSULT NOTE - INITIAL  Pharmacy Consult for Vancomycin Indication: thumb infection s/p I&D, possible necrotizing fasciitis  No Known Allergies  Patient Measurements: Height: 5'7" Weight = 81.6 kg  Vital Signs: Temp: 97.9 F (36.6 C) (01/17 1743) Temp src: Oral (01/17 1415) BP: 138/85 mmHg (01/17 1743) Pulse Rate: 93  (01/17 1743)  Intake/Output from previous day:   Intake/Output from this shift: Total I/O In: 1000 [I.V.:1000] Out: 10 [Blood:10]  Labs:  Basename 12/23/11 1134  WBC 16.8*  HGB 11.0*  PLT 359  LABCREA --  CREATININE 0.79   Estimated CrCl >100 ml/min  Microbiology: Recent Results (from the past 720 hour(s))  GRAM STAIN     Status: Normal   Collection Time   12/23/11  3:38 PM      Component Value Range Status Comment   Specimen Description ABSCESS THUMB LEFT   Final    Special Requests NONE   Final    Gram Stain     Final    Value: ABUNDANT WBC PRESENT,BOTH PMN AND MONONUCLEAR     ABUNDANT GRAM POSITIVE COCCI IN CLUSTERS IN PAIRS     Gram Stain Report Called to,Read Back By and Verified With: Dr Doran Durand 1617 12/23/11 A Browning   Report Status 12/23/2011 FINAL   Final     Medical History: Past Medical History  Diagnosis Date  . Diabetes mellitus type 2, uncontrolled   . Hypertension   . Anemia   . Alcohol dependency   . Diabetes mellitus     type 2    Medications:  Prescriptions prior to admission  Medication Sig Dispense Refill  . amLODipine (NORVASC) 10 MG tablet Take 10 mg by mouth daily.      . ferrous sulfate 325 (65 FE) MG tablet Take 325 mg by mouth 2 (two) times daily with a meal.       . folic acid (FOLVITE) 1 MG tablet Take 1 mg by mouth daily.       . insulin aspart (NOVOLOG) 100 UNIT/ML injection Inject 18 Units into the skin 2 (two) times daily.       Assessment: 46 yo F with 1 week hx L thumb redness, pain, and swelling.  Pt has a history of other amputations due to infection. OR today for I&D of thumb, concern  for necrotizing fasciitis.  Pt received Vancomycin 1gm IV pre-op at 1120 today.   Goal of Therapy:  Vancomycin trough level 15-20 mcg/ml  Plan:  Begin Vancomycin 1gm IV q8 hours.  Next dose due 2000 tonight.  Will follow-up renal function, culture data, and clinical progress.  [employer], Pharm.D., BCPS Clinical Pharmacist Pager [phone]  12/23/2011,6:16 PM

## 2011-12-23 NOTE — ED Notes (Signed)
PATIENT HERE FOR EVALUATION OF HER LEFT THUMB. STATES IT BEGAN SWELLING AND DISCOLORING ABOUT A WEEK AGO. STATES IS NOT TENDER TO PALPATION. THE DISTAL 2/3 OF HER THUMB IS COOL TO TOUCH WITH DELAY CAP REFILL. IT FEELS BOGGY. NO DRAINAGE NOTED OR WOUNDS. STATES IT DOES NOT HURT. THE LOWER 1/3 OF HER THUMB IS CELLULITIC IN APPEARANCE. AREA HAS BEEN MARKED AND TIMED. SHE DENIES INJURY TO THUMB. NOTICED DURING ASSESSMENT PT IS MISSING DIGITS. SHE IS MISSING THE DISTAL END OF HER LEFT MIDDLE FINGER. SHE IS ALSO MISSING THE RIGHT INDEX FINGER. SHE HAS AN ESCHAR LIKE WOUND (1 CM) TO THE PALMER SURFACE OF HER RIGHT THUMB. SHE HAS SCABBING TO HER RIGHT MIDDLE FINGER. THEY ARE NOT SWOLLEN. SHE ALSO CALLED ATTENTION TO A WOUND TO HER RIGHT POSTERIOR LOWER LEG. IT IS APPROX 3 CM IN DIAMETER. SCABBED AND NOT DRAINING. SHE STATES "IT JUST CAME UP A WHILE AGO" PT IS HERE FOR OBSERVATION/PROTOCOL FOR CELLULITIS. SHE IS NPO AT THIS TIME

## 2011-12-24 ENCOUNTER — Other Ambulatory Visit: Payer: Self-pay | Admitting: Orthopedic Surgery

## 2011-12-24 ENCOUNTER — Inpatient Hospital Stay (HOSPITAL_COMMUNITY): Payer: Self-pay | Admitting: Certified Registered Nurse Anesthetist

## 2011-12-24 ENCOUNTER — Encounter (HOSPITAL_COMMUNITY): Payer: Self-pay | Admitting: Certified Registered Nurse Anesthetist

## 2011-12-24 ENCOUNTER — Encounter (HOSPITAL_COMMUNITY): Payer: Self-pay | Admitting: Orthopedic Surgery

## 2011-12-24 ENCOUNTER — Encounter (HOSPITAL_COMMUNITY)
Admission: EM | Disposition: A | Discharge status: Home / Self Care | Payer: Self-pay | Source: Home / Self Care | Attending: Infectious Diseases

## 2011-12-24 HISTORY — PX: I & D EXTREMITY: SHX5045

## 2011-12-24 LAB — HEMOGLOBIN A1C
Hgb A1c MFr Bld: 12.3 % — ABNORMAL HIGH (ref <5.7)
Mean Plasma Glucose: 306 mg/dL — ABNORMAL HIGH (ref <117)

## 2011-12-24 LAB — GLUCOSE, CAPILLARY
Glucose-Capillary: 253 mg/dL — ABNORMAL HIGH (ref 70–99)
Glucose-Capillary: 250 mg/dL — ABNORMAL HIGH (ref 70–99)
Glucose-Capillary: 255 mg/dL — ABNORMAL HIGH (ref 70–99)
Glucose-Capillary: 232 mg/dL — ABNORMAL HIGH (ref 70–99)
Glucose-Capillary: 255 mg/dL — ABNORMAL HIGH (ref 70–99)

## 2011-12-24 LAB — CBC
HCT: 28.2 % — ABNORMAL LOW (ref 36.0–46.0)
Hemoglobin: 8.9 g/dL — ABNORMAL LOW (ref 12.0–15.0)
MCH: 24.1 pg — ABNORMAL LOW (ref 26.0–34.0)
MCHC: 31.6 g/dL (ref 30.0–36.0)
MCV: 76.4 fL — ABNORMAL LOW (ref 78.0–100.0)
Platelets: 333 10*3/uL (ref 150–400)
RBC: 3.69 MIL/uL — ABNORMAL LOW (ref 3.87–5.11)
RDW: 12.8 % (ref 11.5–15.5)
WBC: 17.8 10*3/uL — ABNORMAL HIGH (ref 4.0–10.5)

## 2011-12-24 LAB — VANCOMYCIN, TROUGH: Vancomycin Tr: 43.7 ug/mL (ref 10.0–20.0)

## 2011-12-24 SURGERY — IRRIGATION AND DEBRIDEMENT EXTREMITY
Anesthesia: General | Site: Thumb | Laterality: Left | Wound class: Dirty or Infected

## 2011-12-24 MED ORDER — SODIUM CHLORIDE 0.9 % IR SOLN
Status: DC | PRN
  Administered 2011-12-24: 1000 mL

## 2011-12-24 MED ORDER — INSULIN ASPART 100 UNIT/ML ~~LOC~~ SOLN
SUBCUTANEOUS | Status: DC | PRN
  Administered 2011-12-24: 8 [IU] via SUBCUTANEOUS

## 2011-12-24 MED ORDER — ONDANSETRON HCL 4 MG/2ML IJ SOLN
INTRAMUSCULAR | Status: DC | PRN
  Administered 2011-12-24: 4 mg via INTRAVENOUS

## 2011-12-24 MED ORDER — FENTANYL CITRATE 0.05 MG/ML IJ SOLN
INTRAMUSCULAR | Status: DC | PRN
  Administered 2011-12-24 (×2): 50 ug via INTRAVENOUS

## 2011-12-24 MED ORDER — COLLAGENASE 250 UNIT/GM EX OINT
TOPICAL_OINTMENT | Freq: Every day | CUTANEOUS | Status: DC
  Administered 2011-12-24 – 2011-12-29 (×6): via TOPICAL
  Filled 2011-12-24: qty 30

## 2011-12-24 MED ORDER — PROMETHAZINE HCL 25 MG/ML IJ SOLN
6.2500 mg | INTRAMUSCULAR | Status: DC | PRN
  Administered 2011-12-26: 12.5 mg via INTRAVENOUS
  Filled 2011-12-24: qty 1

## 2011-12-24 MED ORDER — INSULIN GLARGINE 100 UNIT/ML ~~LOC~~ SOLN
25.0000 [IU] | Freq: Every day | SUBCUTANEOUS | Status: DC
  Administered 2011-12-24 – 2011-12-28 (×5): 25 [IU] via SUBCUTANEOUS

## 2011-12-24 MED ORDER — INSULIN ASPART 100 UNIT/ML ~~LOC~~ SOLN
0.0000 [IU] | SUBCUTANEOUS | Status: DC
  Administered 2011-12-24: 8 [IU] via SUBCUTANEOUS
  Administered 2011-12-24 (×2): 5 [IU] via SUBCUTANEOUS
  Administered 2011-12-25 (×5): 3 [IU] via SUBCUTANEOUS
  Administered 2011-12-26: 5 [IU] via SUBCUTANEOUS
  Administered 2011-12-26: 3 [IU] via SUBCUTANEOUS
  Administered 2011-12-26: 8 [IU] via SUBCUTANEOUS
  Administered 2011-12-26 – 2011-12-27 (×3): 3 [IU] via SUBCUTANEOUS
  Administered 2011-12-27 – 2011-12-28 (×4): 2 [IU] via SUBCUTANEOUS
  Administered 2011-12-28: 3 [IU] via SUBCUTANEOUS
  Administered 2011-12-28: 8 [IU] via SUBCUTANEOUS
  Administered 2011-12-28: 3 [IU] via SUBCUTANEOUS
  Administered 2011-12-29: 5 [IU] via SUBCUTANEOUS
  Administered 2011-12-29: 3 [IU] via SUBCUTANEOUS
  Administered 2011-12-29 (×2): 5 [IU] via SUBCUTANEOUS
  Filled 2011-12-24: qty 3

## 2011-12-24 MED ORDER — MIDAZOLAM HCL 5 MG/5ML IJ SOLN
INTRAMUSCULAR | Status: DC | PRN
  Administered 2011-12-24: 2 mg via INTRAVENOUS

## 2011-12-24 MED ORDER — HYDROMORPHONE HCL PF 1 MG/ML IJ SOLN: 0.2500 mg | INTRAMUSCULAR | Status: DC | PRN

## 2011-12-24 MED ORDER — INSULIN GLARGINE 100 UNIT/ML ~~LOC~~ SOLN: 20.0000 [IU] | Freq: Every day | SUBCUTANEOUS | Status: DC

## 2011-12-24 MED ORDER — SODIUM CHLORIDE 0.9 % IV SOLN
INTRAVENOUS | Status: DC | PRN
  Administered 2011-12-24: 16:00:00 via INTRAVENOUS

## 2011-12-24 MED ORDER — MEPERIDINE HCL 25 MG/ML IJ SOLN: 6.2500 mg | INTRAMUSCULAR | Status: DC | PRN

## 2011-12-24 MED ORDER — PROPOFOL 10 MG/ML IV EMUL
INTRAVENOUS | Status: DC | PRN
  Administered 2011-12-24: 120 mg via INTRAVENOUS

## 2011-12-24 MED ORDER — LIVING WELL WITH DIABETES BOOK
Freq: Once | Status: AC
  Administered 2011-12-26: 17:00:00
  Filled 2011-12-24: qty 1

## 2011-12-24 MED ORDER — SUCCINYLCHOLINE CHLORIDE 20 MG/ML IJ SOLN
INTRAMUSCULAR | Status: DC | PRN
  Administered 2011-12-24: 100 mg via INTRAVENOUS

## 2011-12-24 SURGICAL SUPPLY — 58 items
BANDAGE COBAN STERILE 2 (GAUZE/BANDAGES/DRESSINGS) IMPLANT
BANDAGE CONFORM 2  STR LF (GAUZE/BANDAGES/DRESSINGS) IMPLANT
BANDAGE ELASTIC 3 VELCRO ST LF (GAUZE/BANDAGES/DRESSINGS) ×2 IMPLANT
BANDAGE ELASTIC 4 VELCRO ST LF (GAUZE/BANDAGES/DRESSINGS) ×2 IMPLANT
BANDAGE GAUZE ELAST BULKY 4 IN (GAUZE/BANDAGES/DRESSINGS) ×2 IMPLANT
BLADE SURG 15 STRL LF DISP TIS (BLADE) IMPLANT
BLADE SURG 15 STRL SS (BLADE) ×2
BNDG CMPR 9X4 STRL LF SNTH (GAUZE/BANDAGES/DRESSINGS)
BNDG COHESIVE 1X5 TAN STRL LF (GAUZE/BANDAGES/DRESSINGS) IMPLANT
BNDG ESMARK 4X9 LF (GAUZE/BANDAGES/DRESSINGS) IMPLANT
CLOTH BEACON ORANGE TIMEOUT ST (SAFETY) ×2 IMPLANT
CORDS BIPOLAR (ELECTRODE) ×2 IMPLANT
COVER SURGICAL LIGHT HANDLE (MISCELLANEOUS) ×2 IMPLANT
DECANTER SPIKE VIAL GLASS SM (MISCELLANEOUS) ×1 IMPLANT
DRAIN PENROSE 1/4X12 LTX STRL (WOUND CARE) IMPLANT
DRSG ADAPTIC 3X8 NADH LF (GAUZE/BANDAGES/DRESSINGS) IMPLANT
DRSG EMULSION OIL 3X3 NADH (GAUZE/BANDAGES/DRESSINGS) ×2 IMPLANT
DRSG PAD ABDOMINAL 8X10 ST (GAUZE/BANDAGES/DRESSINGS) ×4 IMPLANT
GAUZE KERLIX 2  STERILE LF (GAUZE/BANDAGES/DRESSINGS) ×1 IMPLANT
GAUZE PACKING IODOFORM 1/4X5 (PACKING) ×1 IMPLANT
GAUZE SPONGE 4X4 12PLY STRL LF (GAUZE/BANDAGES/DRESSINGS) ×1 IMPLANT
GAUZE XEROFORM 1X8 LF (GAUZE/BANDAGES/DRESSINGS) ×2 IMPLANT
GLOVE BIO SURGEON STRL SZ 6.5 (GLOVE) ×2 IMPLANT
GLOVE BIO SURGEON STRL SZ7.5 (GLOVE) ×2 IMPLANT
GLOVE BIO SURGEON STRL SZ8.5 (GLOVE) ×1 IMPLANT
GLOVE BIOGEL PI IND STRL 8 (GLOVE) ×1 IMPLANT
GLOVE BIOGEL PI INDICATOR 8 (GLOVE) ×1
GOWN STRL NON-REIN LRG LVL3 (GOWN DISPOSABLE) ×3 IMPLANT
GOWN STRL REIN XL XLG (GOWN DISPOSABLE) ×2 IMPLANT
HANDPIECE INTERPULSE COAX TIP (DISPOSABLE)
KIT BASIN OR (CUSTOM PROCEDURE TRAY) ×2 IMPLANT
KIT ROOM TURNOVER OR (KITS) ×2 IMPLANT
LOOP VESSEL MAXI BLUE (MISCELLANEOUS) ×1 IMPLANT
LOOP VESSEL MINI RED (MISCELLANEOUS) IMPLANT
MANIFOLD NEPTUNE II (INSTRUMENTS) ×2 IMPLANT
NDL HYPO 25X1 1.5 SAFETY (NEEDLE) IMPLANT
NEEDLE HYPO 25X1 1.5 SAFETY (NEEDLE) IMPLANT
NS IRRIG 1000ML POUR BTL (IV SOLUTION) ×2 IMPLANT
PACK ORTHO EXTREMITY (CUSTOM PROCEDURE TRAY) ×2 IMPLANT
PAD ARMBOARD 7.5X6 YLW CONV (MISCELLANEOUS) ×4 IMPLANT
SCRUB BETADINE 4OZ XXX (MISCELLANEOUS) ×2 IMPLANT
SET HNDPC FAN SPRY TIP SCT (DISPOSABLE) IMPLANT
SOLUTION BETADINE 4OZ (MISCELLANEOUS) ×2 IMPLANT
SPONGE GAUZE 4X4 12PLY (GAUZE/BANDAGES/DRESSINGS) ×2 IMPLANT
SPONGE LAP 18X18 X RAY DECT (DISPOSABLE) ×2 IMPLANT
SPONGE LAP 4X18 X RAY DECT (DISPOSABLE) ×1 IMPLANT
SUCTION FRAZIER TIP 10 FR DISP (SUCTIONS) ×2 IMPLANT
SUT ETHILON 4 0 PS 2 18 (SUTURE) ×2 IMPLANT
SUT MON AB 5-0 P3 18 (SUTURE) IMPLANT
SYR CONTROL 10ML LL (SYRINGE) IMPLANT
TOWEL OR 17X24 6PK STRL BLUE (TOWEL DISPOSABLE) ×2 IMPLANT
TOWEL OR 17X26 10 PK STRL BLUE (TOWEL DISPOSABLE) ×2 IMPLANT
TUBE ANAEROBIC SPECIMEN COL (MISCELLANEOUS) IMPLANT
TUBE CONNECTING 12X1/4 (SUCTIONS) ×1 IMPLANT
TUBE FEEDING 5FR 15 INCH (TUBING) IMPLANT
UNDERPAD 30X30 INCONTINENT (UNDERPADS AND DIAPERS) ×2 IMPLANT
WATER STERILE IRR 1000ML POUR (IV SOLUTION) ×2 IMPLANT
YANKAUER SUCT BULB TIP NO VENT (SUCTIONS) ×2 IMPLANT

## 2011-12-24 NOTE — H&P (Signed)
  No interval changes since H&P 12/23/11.

## 2011-12-24 NOTE — ED Provider Notes (Signed)
Medical screening examination/treatment/procedure(s) were conducted as a shared visit with non-physician practitioner(s) and myself.  I personally evaluated the patient during the encounter.  The patient presented complaining of a painful, swollen left thumb.  She has a history of DM and has had similar problems in the past resulting in the amputation of digits.  On exam, the left thumb was red, swollen, and warm to the touch.  Xrays revealed a swollen thumb with gas noted within the soft tissues.  She was given antibiotics and orthopedics was consulted.  She will be admitted to their service, and likely undergo amputation of the thumb.  Veryl Speak, MD 12/24/11 [phone]

## 2011-12-24 NOTE — Op Note (Signed)
Dictation [id number]

## 2011-12-24 NOTE — Consult Note (Signed)
WOC consult Note Reason for Consult: req. To eval. Wounds of LE. Pt reports she has had wounds on her legs for some time.  Not really sure when the most recent areas opened up. Concerning with recent necrotizing fac. Dx.  Pt has bil. Palpable pulses.  No swelling in the lower extremities.  Multiple areas of scarring on the LE and some on the UE as well.  Most of the LE ulcers are closed with crust, has 1 noted open area of the R lateral leg and 2 larger areas of the R malleolar region that have necrotic tissue present. Intact blister of the R lateral foot.  With unclear etiology and since they do not present like venous or arterial ulcers typically present would recommend dermatology consult for bx of current ulcers for more definitive dx for treatment.  I can recommend topical care in the interim of this evaluation.  Wound type: LE chronic ulcerations, unclear etiology Measurement: RLE malleolar lateral 4.0cm x 3.0cm, RLE lateral calf 1.0cm x 1.0cm x 0.2cm, RLE lateral malleolar 2.0cm x 1.0 cm, intact blister RL foot 1.5cm x 1.0cm Wound bed: only open wound of R lateral calf, pale and very dry Drainage (amount, consistency, odor) minimal noted on sheets from R lateral wound,yellow-green with no noted odor Periwound:intact with scarring Dressing procedure/placement/frequency: silicone foam dressings to all open or necrotic wounds, will order Santyl for RLE ankle areas once it arrive to floor.  Change dressings every 3 days, apply Santyl daily.  Discussed POC  with bedside RN and with pt Re consult if needed, will not follow at this time. Thanks  Nyemah Watton [place], [place] [phone])

## 2011-12-24 NOTE — Progress Notes (Signed)
CRITICAL VALUE ALERT  Critical value received: Vanc trough level 43.7  Date of notification:  12/24/2011  Time of notification:  2005   Critical value read back:yes  Nurse who received alert:  Darnelle Bos, RN  MD notified (1st page):  Horton Chin, Pharm D  Time of first page:  2015   MD notified (2nd page):  Time of second page:  Responding MD:  Horton Chin, Sherian Rein D  Time MD responded:  [phone]

## 2011-12-24 NOTE — Preoperative (Signed)
Beta Blockers   Reason not to administer Beta Blockers:Not Applicable 

## 2011-12-24 NOTE — Progress Notes (Signed)
ANTIBIOTIC CONSULT NOTE - FOLLOW UP  Pharmacy Consult for Vancomycin Indication: thumb infection s/p I&D, possible necrotizing fasciitis  No Known Allergies  Patient Measurements: Height: 5' 6.93" (170 cm) Weight: 179 lb 14.3 oz (81.6 kg) IBW/kg (Calculated) : 61.44    Vital Signs: Temp: 97.5 F (36.4 C) (01/18 1715) Temp src: Oral (01/18 1402) BP: 116/58 mmHg (01/18 1733) Pulse Rate: 88  (01/18 1736) Intake/Output from previous day: 01/17 0701 - 01/18 0700 In: 1000 [I.V.:1000] Out: 335 [Urine:325; Blood:10] Intake/Output from this shift:    Labs:  Basename 12/24/11 0525 12/23/11 1134  WBC 17.8* 16.8*  HGB 8.9* 11.0*  PLT 333 359  LABCREA -- --  CREATININE -- 0.79   Estimated Creatinine Clearance: 98.5 ml/min (by C-G formula based on Cr of 0.79).  Basename 12/24/11 1856  VANCOTROUGH 43.7*  VANCOPEAK --  Jake Michaelis --  GENTTROUGH --  GENTPEAK --  GENTRANDOM --  TOBRATROUGH --  TOBRAPEAK --  TOBRARND --  AMIKACINPEAK --  AMIKACINTROU --  AMIKACIN --     Microbiology: Recent Results (from the past 720 hour(s))  CULTURE, BLOOD (ROUTINE X 2)     Status: Normal (Preliminary result)   Collection Time   12/23/11 11:05 AM      Component Value Range Status Comment   Specimen Description BLOOD ARM LEFT   Final    Special Requests     Final    Value: BOTTLES DRAWN AEROBIC AND ANAEROBIC 10CC AER 4CC ANA   Setup Time [id number]   Final    Culture     Final    Value:        BLOOD CULTURE RECEIVED NO GROWTH TO DATE CULTURE WILL BE HELD FOR 5 DAYS BEFORE ISSUING A FINAL NEGATIVE REPORT   Report Status PENDING   Incomplete   CULTURE, BLOOD (ROUTINE X 2)     Status: Normal (Preliminary result)   Collection Time   12/23/11 11:10 AM      Component Value Range Status Comment   Specimen Description BLOOD RIGHT HAND   Final    Special Requests BOTTLES DRAWN AEROBIC ONLY 5CC   Final    Setup Time [id number]   Final    Culture     Final    Value:        BLOOD CULTURE  RECEIVED NO GROWTH TO DATE CULTURE WILL BE HELD FOR 5 DAYS BEFORE ISSUING A FINAL NEGATIVE REPORT   Report Status PENDING   Incomplete   ANAEROBIC CULTURE     Status: Normal (Preliminary result)   Collection Time   12/23/11  3:38 PM      Component Value Range Status Comment   Specimen Description ABSCESS THUMB LEFT   Final    Special Requests NONE   Final    Gram Stain     Final    Value: ABUNDANT WBC PRESENT,BOTH PMN AND MONONUCLEAR     ABUNDANT GRAM POSITIVE COCCI     IN PAIRS IN CLUSTERS Performed at [facility] Gram Stain Report Called to,Read Back By and Verified With: Gram Stain Report Called to,Read Back By and Verified With: DR HEWITT [id number] 12/23/11 A BROWNING   Culture     Final    Value: NO ANAEROBES ISOLATED; CULTURE IN PROGRESS FOR 5 DAYS   Report Status PENDING   Incomplete   CULTURE, ROUTINE-ABSCESS     Status: Normal (Preliminary result)   Collection Time   12/23/11  3:38 PM      Component Value  Range Status Comment   Specimen Description ABSCESS THUMB LEFT   Final    Special Requests NONE   Final    Gram Stain     Final    Value: ABUNDANT WBC PRESENT,BOTH PMN AND MONONUCLEAR     ABUNDANT GRAM POSITIVE COCCI     IN PAIRS IN CLUSTERS Performed at [facility] Gram Stain Report Called to,Read Back By and Verified With: Gram Stain Report Called to,Read Back By and Verified With: DR HEWITT [id number] 12/23/11 A BROWNING   Culture NO GROWTH   Final    Report Status PENDING   Incomplete   GRAM STAIN     Status: Normal   Collection Time   12/23/11  3:38 PM      Component Value Range Status Comment   Specimen Description ABSCESS THUMB LEFT   Final    Special Requests NONE   Final    Gram Stain     Final    Value: ABUNDANT WBC PRESENT,BOTH PMN AND MONONUCLEAR     ABUNDANT GRAM POSITIVE COCCI IN CLUSTERS IN PAIRS     Gram Stain Report Called to,Read Back By and Verified With: Dr Doran Durand 1617 12/23/11 A Browning   Report Status 12/23/2011 FINAL   Final   ANAEROBIC CULTURE      Status: Normal (Preliminary result)   Collection Time   12/23/11  3:46 PM      Component Value Range Status Comment   Specimen Description ABSCESS THUMB LEFT   Final    Special Requests NONE   Final    Gram Stain     Final    Value: NO WBC SEEN     ABUNDANT SQUAMOUS EPITHELIAL CELLS PRESENT     ABUNDANT GRAM POSITIVE COCCI     IN PAIRS ABUNDANT GRAM POSITIVE RODS     ABUNDANT GRAM NEGATIVE RODS   Culture     Final    Value: NO ANAEROBES ISOLATED; CULTURE IN PROGRESS FOR 5 DAYS   Report Status PENDING   Incomplete   CULTURE, ROUTINE-ABSCESS     Status: Normal (Preliminary result)   Collection Time   12/23/11  3:46 PM      Component Value Range Status Comment   Specimen Description ABSCESS THUMB LEFT   Final    Special Requests NONE   Final    Gram Stain     Final    Value: NO WBC SEEN     ABUNDANT SQUAMOUS EPITHELIAL CELLS PRESENT     ABUNDANT GRAM POSITIVE COCCI     IN PAIRS ABUNDANT GRAM POSITIVE RODS     FEW GRAM NEGATIVE RODS   Culture NO GROWTH   Final    Report Status PENDING   Incomplete     Anti-infectives     Start     Dose/Rate Route Frequency Ordered Stop   12/23/11 2000   vancomycin (VANCOCIN) IVPB 1000 mg/200 mL premix     Comments: Pharmacy to dose      1,000 mg 200 mL/hr over 60 Minutes Intravenous Every 8 hours 12/23/11 1703     12/23/11 2000  piperacillin-tazobactam (ZOSYN) IVPB 3.375 g       3.375 g 12.5 mL/hr over 240 Minutes Intravenous 3 times per day 12/23/11 1703     12/23/11 1045   vancomycin (VANCOCIN) IVPB 1000 mg/200 mL premix        1,000 mg 200 mL/hr over 60 Minutes Intravenous  Once 12/23/11 1042 12/23/11 1221   12/23/11 1045  piperacillin-tazobactam (ZOSYN) IVPB 4.5 g       4.5 g 200 mL/hr over 30 Minutes Intravenous  Once 12/23/11 1042 12/23/11 1306          Assessment: 46 yo F on day#2 IV Vancomycin and Zosyn for thumb infection.  Pt returned to OR today for further I&D of wound.  Vancomycin trough was checked this evening with  4th dose as pt has a history of supratherapeutic levels with Vancomycin regimen.  Despite young age and CrCL~ 100 ml/min it appears this patient is not clearing Vancomycin as well as expected.  Goal of Therapy:  Vancomycin trough level 15-20 mcg/ml  Plan:  1.  D/C current Vancomycin regimen.  Confirmed with the nurse that 8pm Vancomycin dose was not given. 2.  Order Random Vancomycin level with AM labs.  3.  Based on two levels, pharmacy can calculate patient specific kinetics and further dose the patient's Vancomycin.  [employer], Pharm.D., BCPS Clinical Pharmacist Pager [phone]  12/24/2011,8:18 PM

## 2011-12-24 NOTE — Anesthesia Postprocedure Evaluation (Signed)
  Anesthesia Post-op Note  Patient: [name]  Procedure(s) Performed:  IRRIGATION AND DEBRIDEMENT EXTREMITY - I&Dleft thumb with partial amputation  Patient Location: PACU  Anesthesia Type: General  Level of Consciousness: awake  Airway and Oxygen Therapy: Patient Spontanous Breathing and Patient connected to nasal cannula oxygen  Post-op Pain: mild  Post-op Assessment: Post-op Vital signs reviewed, Patient's Cardiovascular Status Stable, Respiratory Function Stable, Patent Airway and No signs of Nausea or vomiting  Post-op Vital Signs: Reviewed and stable  Complications: No apparent anesthesia complications

## 2011-12-24 NOTE — Progress Notes (Signed)
Subjective: Patient feels OK, no complaints. No fever or chills. No draining from left thumb after surgery.   Objective: Vital signs in last 24 hours: Filed Vitals:   12/23/11 2127 12/24/11 0155 12/24/11 0545 12/24/11 1018  BP: 118/60 95/50 134/73 118/65  Pulse: 99 94 97 96  Temp: 98.9 F (37.2 C) 98.8 F (37.1 C) 98.8 F (37.1 C) 98.6 F (37 C)  TempSrc: Oral   Oral  Resp: 20 20 16 19   Height:      Weight:      SpO2: 100% 97% 100% 96%   Weight change:   Intake/Output Summary (Last 24 hours) at 12/24/11 1129 Last data filed at 12/24/11 0400  Gross per 24 hour  Intake   1000 ml  Output    335 ml  Net    665 ml   Physical Exam: General: resting in bed, not in acute distress HEENT: PERRL, EOMI, no scleral icterus Cardiac: S1/S2, RRR, No murmurs, gallops or rubs Pulm: Good air movement bilaterally, Clear to auscultation bilaterally, No rales, wheezing, rhonchi or rubs. Abd: Soft,  nondistended, nontender, no rebound pain, no organomegaly, BS present Ext: No rashes or edema, 1+DP/PT pulse bilaterally. Right index finger amputation. Left thumb is dressed without draining.   Patient legs have several skin lesions. Left anterior leg has two small skin lesions which have denuded skin and look like infected.   Her right leg has 2 X 2 cm skin lesion on the lateral side of leg, which look like infected. There is pus on the surface of the skin lesion. Patient reports no pain over the lesions.  Neuro: alert and oriented X3, cranial nerves II-XII grossly intact, muscle strength 5/5 in all extremeties,  sensation to light touch intact.      Lab Results: Basic Metabolic Panel:  Lab 0000000 1134  NA 133*  K 5.3*  CL 96  CO2 23  GLUCOSE 363*  BUN 14  CREATININE 0.79  CALCIUM 9.4  MG --  PHOS --     Lab 12/24/11 0525 12/23/11 1134  WBC 17.8* 16.8*  NEUTROABS -- 14.3*  HGB 8.9* 11.0*  HCT 28.2* 34.6*  MCV 76.4* 75.9*  PLT 333 359  CBG:  Lab 12/24/11 0806 12/23/11  2206 12/23/11 1759 12/23/11 1642 12/23/11 1444  GLUCAP 253* 193* 222* 209* 285*   Hemoglobin A1C:  Lab 12/23/11 1930  HGBA1C 12.3*     Micro Results: Recent Results (from the past 240 hour(s))  CULTURE, BLOOD (ROUTINE X 2)     Status: Normal (Preliminary result)   Collection Time   12/23/11 11:05 AM      Component Value Range Status Comment   Specimen Description BLOOD ARM LEFT   Final    Special Requests     Final    Value: BOTTLES DRAWN AEROBIC AND ANAEROBIC 10CC AER 4CC ANA   Setup Time [id number]   Final    Culture     Final    Value:        BLOOD CULTURE RECEIVED NO GROWTH TO DATE CULTURE WILL BE HELD FOR 5 DAYS BEFORE ISSUING A FINAL NEGATIVE REPORT   Report Status PENDING   Incomplete   CULTURE, BLOOD (ROUTINE X 2)     Status: Normal (Preliminary result)   Collection Time   12/23/11 11:10 AM      Component Value Range Status Comment   Specimen Description BLOOD RIGHT HAND   Final    Special Requests BOTTLES DRAWN AEROBIC ONLY 5CC  Final    Setup Time [id number]   Final    Culture     Final    Value:        BLOOD CULTURE RECEIVED NO GROWTH TO DATE CULTURE WILL BE HELD FOR 5 DAYS BEFORE ISSUING A FINAL NEGATIVE REPORT   Report Status PENDING   Incomplete   ANAEROBIC CULTURE     Status: Normal (Preliminary result)   Collection Time   12/23/11  3:38 PM      Component Value Range Status Comment   Specimen Description ABSCESS THUMB LEFT   Final    Special Requests NONE   Final    Gram Stain     Final    Value: ABUNDANT WBC PRESENT,BOTH PMN AND MONONUCLEAR     ABUNDANT GRAM POSITIVE COCCI     IN PAIRS IN CLUSTERS Performed at [facility] Gram Stain Report Called to,Read Back By and Verified With: Gram Stain Report Called to,Read Back By and Verified With: DR HEWITT [id number] 12/23/11 A BROWNING   Culture PENDING   Incomplete    Report Status PENDING   Incomplete   CULTURE, ROUTINE-ABSCESS     Status: Normal (Preliminary result)   Collection Time   12/23/11  3:38 PM        Component Value Range Status Comment   Specimen Description ABSCESS THUMB LEFT   Final    Special Requests NONE   Final    Gram Stain     Final    Value: ABUNDANT WBC PRESENT,BOTH PMN AND MONONUCLEAR     ABUNDANT GRAM POSITIVE COCCI     IN PAIRS IN CLUSTERS Performed at [facility] Gram Stain Report Called to,Read Back By and Verified With: Gram Stain Report Called to,Read Back By and Verified With: DR HEWITT [id number] 12/23/11 A BROWNING   Culture NO GROWTH   Final    Report Status PENDING   Incomplete   GRAM STAIN     Status: Normal   Collection Time   12/23/11  3:38 PM      Component Value Range Status Comment   Specimen Description ABSCESS THUMB LEFT   Final    Special Requests NONE   Final    Gram Stain     Final    Value: ABUNDANT WBC PRESENT,BOTH PMN AND MONONUCLEAR     ABUNDANT GRAM POSITIVE COCCI IN CLUSTERS IN PAIRS     Gram Stain Report Called to,Read Back By and Verified With: Dr Doran Durand [id number] 12/23/11 A Browning   Report Status 12/23/2011 FINAL   Final   ANAEROBIC CULTURE     Status: Normal (Preliminary result)   Collection Time   12/23/11  3:46 PM      Component Value Range Status Comment   Specimen Description ABSCESS THUMB LEFT   Final    Special Requests NONE   Final    Gram Stain     Final    Value: NO WBC SEEN     ABUNDANT SQUAMOUS EPITHELIAL CELLS PRESENT     ABUNDANT GRAM POSITIVE COCCI     IN PAIRS ABUNDANT GRAM POSITIVE RODS     ABUNDANT GRAM NEGATIVE RODS   Culture PENDING   Incomplete    Report Status PENDING   Incomplete   CULTURE, ROUTINE-ABSCESS     Status: Normal (Preliminary result)   Collection Time   12/23/11  3:46 PM      Component Value Range Status Comment   Specimen Description ABSCESS THUMB LEFT   Final  Special Requests NONE   Final    Gram Stain     Final    Value: NO WBC SEEN     ABUNDANT SQUAMOUS EPITHELIAL CELLS PRESENT     ABUNDANT GRAM POSITIVE COCCI     IN PAIRS ABUNDANT GRAM POSITIVE RODS     FEW GRAM NEGATIVE RODS    Culture NO GROWTH   Final    Report Status PENDING   Incomplete    Studies/Results: Dg Hand Complete Left  12/23/2011  *RADIOLOGY REPORT*  Clinical Data: Pain and swelling; diabetes  LEFT HAND - COMPLETE 3+ VIEW  Comparison: August 18, 2011  Findings: There is a large amount of gas within the soft tissues of the left thumb.  There is no evidence of osseous erosion or periosteal reaction to suggest osteomyelitis at this time.  There has been amputation of the third distal phalanx.  IMPRESSION: Marked  subcutaneous emphysema involving the left thumb which is consistent with cellulitis from a gas-forming organism.  Original Report Authenticated By: Duayne Cal, M.D.   Medications:  Scheduled Meds:   . amLODipine  10 mg Oral Daily  . ferrous sulfate  325 mg Oral BID WC  . folic acid  1 mg Oral Daily  . insulin aspart      . insulin aspart  0-15 Units Subcutaneous Q4H  . insulin aspart  10 Units Intravenous Once  . insulin aspart  5 Units Subcutaneous Once  . insulin glargine  25 Units Subcutaneous QHS  .  morphine injection  4 mg Intravenous Once  . mulitivitamin with minerals  1 tablet Oral Daily  . piperacillin-tazobactam (ZOSYN)  IV  3.375 g Intravenous Q8H  . piperacillin-tazobactam (ZOSYN)  IV  4.5 g Intravenous Once  . thiamine  100 mg Oral Daily   Or  . thiamine  100 mg Intravenous Daily  . vancomycin  1,000 mg Intravenous Once  . vancomycin  1,000 mg Intravenous Q8H  . DISCONTD: amLODipine  10 mg Oral Daily  . DISCONTD: insulin aspart  0-5 Units Subcutaneous QHS  . DISCONTD: insulin aspart  10 Units Subcutaneous Once  . DISCONTD: insulin aspart  18 Units Subcutaneous BID  . DISCONTD: insulin glargine  15 Units Subcutaneous QHS  . DISCONTD: insulin glargine  20 Units Subcutaneous QHS   Continuous Infusions:  PRN Meds:.diphenhydrAMINE, HYDROmorphone, LORazepam, LORazepam, methocarbamol(ROBAXIN) IV, methocarbamol, metoCLOPramide (REGLAN) injection, metoCLOPramide,  ondansetron, ondansetron (ZOFRAN) IV, ondansetron, oxyCODONE-acetaminophen, DISCONTD: bupivacaine, DISCONTD: HYDROmorphone, DISCONTD: ondansetron (ZOFRAN) IV, DISCONTD: sodium chloride irrigation, DISCONTD: temazepam Assessment/Plan:   # Necrotizing fasciitis: patient's swelling thumb with redness and tenderness is most likely casued by necrotizing fasciitis.  X-ray showed that there is no evidence of ossteous erosion or periosteal reaction to suggest osteomyelitis at this time. But with marked  subcutaneous emphysema involving the left thumb which is consistent with cellulitis from a gas-forming organism. Patient still has leukocytosis.  -Consulted orthopedic surgeon. Appreciate  Orthopedic's help in managing our patient. Dr Fredna Dow saw patient and did IRRIGATION AND DEBRIDEMENT. Will do I & D again today -continue IV zosyn and Vancomycin, pending culture and sensitivity -pain control with percocet and tylenol -will repeat CBC and BMET  # DM-II:  Poorly controlled. A1c is 12.3 on this admission. Patient supposes to use Novolog 18 U twice daily at home. But she only used once daily at the best.  -change Lantus to  25 U daily and SSI-moderate sensitivity.  -S/C the scheduled Novolg twice daily  # HTN: Bp is 118/65 today.  Will continue her home dose amlodipine 10 mg daily.  # Alcohol abuse: Drinks 80 ounce of beer/day, last drinking was yesterday. Currently no withdraw symptoms.  -will continue CIWA protocol -will continue VB1 and folate   #: skin lesions: will get wound care consult.   # DVT PPX: SCD       LOS: 1 day   Ivor Costa 12/24/2011, 11:29 AM

## 2011-12-24 NOTE — Anesthesia Preprocedure Evaluation (Signed)
Anesthesia Evaluation  Patient identified by MRN, date of birth, ID band Patient awake    Reviewed: Allergy & Precautions, H&P , NPO status , Patient's Chart, lab work & pertinent test results  Airway Mallampati: III TM Distance: >3 FB Neck ROM: Full    Dental No notable dental hx. (+) Edentulous Upper and Edentulous Lower   Pulmonary neg pulmonary ROS,  clear to auscultation  Pulmonary exam normal       Cardiovascular hypertension, On Medications Regular Normal    Neuro/Psych Negative Neurological ROS  Negative Psych ROS   GI/Hepatic negative GI ROS, Neg liver ROS,   Endo/Other  Diabetes mellitus-, Poorly Controlled, Type 2, Oral Hypoglycemic Agents  Renal/GU negative Renal ROS  Genitourinary negative   Musculoskeletal   Abdominal   Peds  Hematology negative hematology ROS (+)   Anesthesia Other Findings   Reproductive/Obstetrics negative OB ROS                           Anesthesia Physical Anesthesia Plan  ASA: III  Anesthesia Plan: General   Post-op Pain Management:    Induction: Intravenous  Airway Management Planned: Oral ETT  Additional Equipment:   Intra-op Plan:   Post-operative Plan: Extubation in OR  Informed Consent: I have reviewed the patients History and Physical, chart, labs and discussed the procedure including the risks, benefits and alternatives for the proposed anesthesia with the patient or authorized representative who has indicated his/her understanding and acceptance.     Plan Discussed with: CRNA  Anesthesia Plan Comments:         Anesthesia Quick Evaluation

## 2011-12-24 NOTE — Transfer of Care (Signed)
Immediate Anesthesia Transfer of Care Note  Patient: [name]  Procedure(s) Performed:  IRRIGATION AND DEBRIDEMENT EXTREMITY - I&Dleft thumb with partial amputation  Patient Location: PACU  Anesthesia Type: General  Level of Consciousness: awake, alert  and oriented  Airway & Oxygen Therapy: Patient Spontanous Breathing and Patient connected to nasal cannula oxygen  Post-op Assessment: Report given to PACU RN and Post -op Vital signs reviewed and stable  Post vital signs: Reviewed and stable Filed Vitals:   12/24/11 1719  BP:   Pulse:   Temp:   Resp: 30    Complications: No apparent anesthesia complications

## 2011-12-24 NOTE — Progress Notes (Signed)
Notified Dr. Blaine Hamper of BS 253. Orders to hold insulin for now given since pt is NPO and unknown time for sx.

## 2011-12-24 NOTE — Progress Notes (Signed)
Notified Dr. Blaine Hamper of BS 250. Orders given to cover with 5 units novolog.

## 2011-12-24 NOTE — Anesthesia Procedure Notes (Signed)
Procedure Name: Intubation Date/Time: 12/24/2011 4:16 PM Performed by: Angelique Holm Pre-anesthesia Checklist: Patient identified, Timeout performed, Emergency Drugs available, Suction available and Patient being monitored Patient Re-evaluated:Patient Re-evaluated prior to inductionOxygen Delivery Method: Circle System Utilized Preoxygenation: Pre-oxygenation with 100% oxygen Intubation Type: IV induction, Rapid sequence and Cricoid Pressure applied Laryngoscope Size: Mac and 3 Grade View: Grade I Tube type: Oral Tube size: 7.5 mm Number of attempts: 1 Airway Equipment and Method: stylet Placement Confirmation: ETT inserted through vocal cords under direct vision,  positive ETCO2 and breath sounds checked- equal and bilateral Secured at: 20 cm Tube secured with: Tape Dental Injury: Teeth and Oropharynx as per pre-operative assessment

## 2011-12-24 NOTE — Progress Notes (Signed)
Subjective: Day of Surgery Procedure(s) (LRB): IRRIGATION AND DEBRIDEMENT EXTREMITY (Left) Patient reports pain as mild.  Some nausea overnight.  No fevers, chills, night sweats.  Objective: Vital signs in last 24 hours: Temp:  [97.9 F (36.6 C)-98.9 F (37.2 C)] 98.7 F (37.1 C) (01/18 1402) Pulse Rate:  [85-100] 85  (01/18 1402) Resp:  [16-20] 20  (01/18 1402) BP: (95-138)/(50-85) 132/70 mmHg (01/18 1402) SpO2:  [95 %-100 %] 95 % (01/18 1402) Weight:  [81.6 kg (179 lb 14.3 oz)] 81.6 kg (179 lb 14.3 oz) (01/17 1818)  Intake/Output from previous day: 01/17 0701 - 01/18 0700 In: 1000 [I.V.:1000] Out: 335 [Urine:325; Blood:10] Intake/Output this shift:     Basename 12/24/11 0525 12/23/11 1134  HGB 8.9* 11.0*    Basename 12/24/11 0525 12/23/11 1134  WBC 17.8* 16.8*  RBC 3.69* 4.56  HCT 28.2* 34.6*  PLT 333 359    Basename 12/23/11 1134  NA 133*  K 5.3*  CL 96  CO2 23  BUN 14  CREATININE 0.79  GLUCOSE 363*  CALCIUM 9.4   No results found for this basename: LABPT:2,INR:2 in the last 72 hours  dressing clean/dry/intact.  no erythema or streaks proximal to dressing.  Assessment/Plan: Day of Surgery Procedure(s) (LRB): IRRIGATION AND DEBRIDEMENT EXTREMITY (Left) Return to OR for repeat I&D and possible thumb amputation.  Risks, benefits, alternatives discussed and patient agrees with plan of care.  Madie Cahn R 12/24/2011, 3:41 PM

## 2011-12-24 NOTE — Op Note (Signed)
NAMELEAYA, MCCORKLE NO.:  [id number]  MEDICAL RECORD NO.:  [id number]  LOCATION:  [id number]                         FACILITY:  [facility]  PHYSICIAN:  Leanora Cover, MD        DATE OF BIRTH:  07-Oct-1966  DATE OF PROCEDURE:  12/23/2011 DATE OF DISCHARGE:                              OPERATIVE REPORT   PREOPERATIVE DIAGNOSIS:  Left thumb abscess possible necrotizing fasciitis.  POSTOPERATIVE DIAGNOSIS:  Left thumb abscess possible necrotizing fasciitis.  PROCEDURE:  Irrigation and debridement of left thumb and palm.  SURGEON:  Leanora Cover, MD  ASSISTANTS:  None.  ANESTHESIA:  General.  IV FLUIDS:  Per anesthesia flow sheet.  ESTIMATED BLOOD LOSS:  Minimal.  COMPLICATIONS:  None.  SPECIMENS:  Cultures from left thumb to micro.  TOURNIQUET TIME:  57 minutes.  DISPOSITION:  Stable to PACU.  INDICATIONS:  Ms. Bru is a 46 year old right-hand dominant female who for 1 week has noticed increased pain, swelling, and erythema of her left thumb.  She thought it might get better.  She presented to the emergency department this morning.  She was evaluated and felt to have an abscess of the thumb.  I was consulted for management of the injury. She has had previous amputations of right index finger and partial amputation of the left long finger for her previous infection by Dr. Amedeo Plenty.  She does not remember any wounds or injuries to the left thumb. She had fevers and chills last night.  On examination, she had fluctuance of the thumb.  There was erythema of the thumb.  There was no streaking in the forearm.  The erythema went down into the thenar eminence.  She was minimally tender to palpation.  She was not tender over the hypothenar eminence or in any other digits.  She was not tender over the palm of the hand or in the wrist or forearm.  Her white blood count was 16.  I discussed with Ms. Kalbach and her husband the nature of her condition.  I recommended going  to the operating room for aggressive irrigation and debridement of the left thumb with potential need for amputation.  We discussed risks, benefits, and alternatives of surgery including the risk of blood loss, infection, damage to nerves, vessels, tendons, ligaments, bone, failure of surgery, need for additional surgery, complications with wound healing, continued pain, continued infection, need for repeat irrigation and debridement, and potential need for amputation of the thumb, hand or arm.  They voiced understanding of these risks and elected to proceed.  We also discussed that if this is necrotizing fasciitis, it could have mortality associated.  OPERATIVE COURSE:  After being identified preoperatively by myself, the patient and I agreed upon the procedure and site of procedure.  The surgical site was marked.  The risks, benefits, and alternatives of surgery were reviewed and she wished to proceed.  Surgical consent has been signed.  She had been given IV antibiotics in the emergency department for coverage of the infection.  She was transported to the operating room and placed on the operating room table in supine position with the left upper extremity on arm board.  General anesthesia  was induced by the anesthesiologist.  Left upper extremity was prepped and draped in normal sterile orthopedic fashion.  A surgical pause was performed between surgeons, anesthesia, and operating room staff and all were in agreement as to the patient, procedure, and site of procedure.  Tourniquet proximal aspect of the extremity was inflated to 250 mmHg after gravity exsanguination of the limb.  An incision was made on the volar aspect of the thumb in a Brunner fashion.  There was significant purulence under the epidermis which completely peeled off circumferentially around the thumb including the nail.  This occurred proximal to the IP joint.  Cultures were taken and sent for stat Gram stain,  aerobes and anaerobes.  The incision was carried into the deeper skin.  Again, there was purulence in the subcutaneous tissues.  This was also cultured.  The incision was carried down into the thenar eminence where there did not appear to be any more purulence and the tissues appeared healthy.  The tissues at the distal phalanx were very damaged and somewhat necrotic.  The turgor was very poor.  An incision was made on the dorsum of the thumb.  There was purulence within here as well. All the purulence was very foul smelling.  All areas of purulence were soft.  The wound was copiously irrigated with 3000 mL of sterile saline by cysto tubing.  The wound was then debrided using a combination of rongeur and curette.  The neurovascular bundles were identified and were protected throughout the case.  Once debridement of necrotic tissues had been performed, the wound was again copiously irrigated with 3000 mL of sterile saline by cysto tubing.  The wound was then injected with 10 mL of 0.25% plain Marcaine to help with postoperative analgesia.  The wound was packed with quarter-inch iodoform gauze and dressed with sterile Xeroform, 4x4s, ABD, Kerlix and an Ace bandage.  The tourniquet was deflated at 56 minutes.  The operative drapes were broken down, and the patient was awoken from anesthesia safely.  She was transferred back to the stretcher and taken to PACU in stable condition.  We will keep her for IV antibiotics.  Likely need to return to the operating room within 24-48 hours for repeat irrigation and debridement, potential amputation of portion or all of the thumb.  We will discuss this further with the patient and her husband.  We will ask the hospitalist to see her.     Leanora Cover, MD     KK/MEDQ  D:  12/23/2011  T:  12/24/2011  Job:  [id number]

## 2011-12-24 NOTE — Brief Op Note (Signed)
12/23/2011 - 12/24/2011  5:07 PM  PATIENT:  Nyra Jabs  46 y.o. female  PRE-OPERATIVE DIAGNOSIS:  necrotizining fascitis  POST-OPERATIVE DIAGNOSIS:  Necrotizing fascitis  PROCEDURE:  Procedure(s): IRRIGATION AND DEBRIDEMENT EXTREMITY  SURGEON:  Surgeon(s): Tennis Must, MD Schuyler Amor, MD  PHYSICIAN ASSISTANT:   ASSISTANTS: none   ANESTHESIA:   general  EBL:     BLOOD ADMINISTERED:none  DRAINS: none   LOCAL MEDICATIONS USED:  NONE  SPECIMEN:  Source of Specimen:  left thumb  DISPOSITION OF SPECIMEN:  PATHOLOGY  COUNTS:  YES  TOURNIQUET:  * Missing tourniquet times found for documented tourniquets in log:  19803 *  DICTATION: .Other Dictation: Dictation Number [id number]  PLAN OF CARE: Admit to inpatient   PATIENT DISPOSITION:  PACU - hemodynamically stable.   Delay start of Pharmacological VTE agent (>24hrs) due to surgical blood loss or risk of bleeding:  no

## 2011-12-25 DIAGNOSIS — M726 Necrotizing fasciitis: Secondary | ICD-10-CM

## 2011-12-25 DIAGNOSIS — E119 Type 2 diabetes mellitus without complications: Secondary | ICD-10-CM

## 2011-12-25 LAB — CBC
HCT: 28.5 % — ABNORMAL LOW (ref 36.0–46.0)
Hemoglobin: 8.9 g/dL — ABNORMAL LOW (ref 12.0–15.0)
MCH: 24 pg — ABNORMAL LOW (ref 26.0–34.0)
MCHC: 31.2 g/dL (ref 30.0–36.0)
MCV: 76.8 fL — ABNORMAL LOW (ref 78.0–100.0)
Platelets: 384 10*3/uL (ref 150–400)
RBC: 3.71 MIL/uL — ABNORMAL LOW (ref 3.87–5.11)
RDW: 12.9 % (ref 11.5–15.5)
WBC: 15.1 10*3/uL — ABNORMAL HIGH (ref 4.0–10.5)

## 2011-12-25 LAB — BASIC METABOLIC PANEL
BUN: 23 mg/dL (ref 6–23)
CO2: 26 mEq/L (ref 19–32)
Calcium: 8 mg/dL — ABNORMAL LOW (ref 8.4–10.5)
Chloride: 97 mEq/L (ref 96–112)
Creatinine, Ser: 1.65 mg/dL — ABNORMAL HIGH (ref 0.50–1.10)
GFR calc Af Amer: 43 mL/min — ABNORMAL LOW (ref >90)
GFR calc non Af Amer: 37 mL/min — ABNORMAL LOW (ref >90)
Glucose, Bld: 154 mg/dL — ABNORMAL HIGH (ref 70–99)
Potassium: 4.4 mEq/L (ref 3.5–5.1)
Sodium: 133 mEq/L — ABNORMAL LOW (ref 135–145)

## 2011-12-25 LAB — GLUCOSE, CAPILLARY
Glucose-Capillary: 136 mg/dL — ABNORMAL HIGH (ref 70–99)
Glucose-Capillary: 152 mg/dL — ABNORMAL HIGH (ref 70–99)
Glucose-Capillary: 175 mg/dL — ABNORMAL HIGH (ref 70–99)
Glucose-Capillary: 178 mg/dL — ABNORMAL HIGH (ref 70–99)
Glucose-Capillary: 194 mg/dL — ABNORMAL HIGH (ref 70–99)
Glucose-Capillary: 196 mg/dL — ABNORMAL HIGH (ref 70–99)

## 2011-12-25 LAB — VANCOMYCIN, RANDOM: Vancomycin Rm: 27.7 ug/mL

## 2011-12-25 MED ORDER — VANCOMYCIN HCL IN DEXTROSE 1-5 GM/200ML-% IV SOLN
1000.0000 mg | INTRAVENOUS | Status: DC
  Administered 2011-12-26 – 2011-12-28 (×3): 1000 mg via INTRAVENOUS
  Filled 2011-12-25 (×3): qty 200

## 2011-12-25 NOTE — Progress Notes (Signed)
ANTIBIOTIC CONSULT NOTE - FOLLOW UP  Pharmacy Consult for Vancomycin Indication: thumb infection s/p I&D, possible necrotizing fasciitis  No Known Allergies  Patient Measurements: Height: 5' 6.93" (170 cm) Weight: 179 lb 14.3 oz (81.6 kg) IBW/kg (Calculated) : 61.44    Vital Signs: Temp: 98.3 F (36.8 C) (01/19 0530) Temp src: Oral (01/19 0530) BP: 111/67 mmHg (01/19 0530) Pulse Rate: 85  (01/19 0530) Intake/Output from previous day: 01/18 0701 - 01/19 0700 In: 1033 [P.O.:240; I.V.:793] Out: 420 [Urine:400; Blood:20] Intake/Output from this shift:    Labs:  Basename 12/25/11 0917 12/24/11 0525 12/23/11 1134  WBC 15.1* 17.8* 16.8*  HGB 8.9* 8.9* 11.0*  PLT 384 333 359  LABCREA -- -- --  CREATININE 1.65* -- 0.79   Estimated Creatinine Clearance: 47.7 ml/min (by C-G formula based on Cr of 1.65).  Basename 12/25/11 0917 12/24/11 1856  VANCOTROUGH -- 43.7*  VANCOPEAK -- --  Jake Michaelis 27.7 --  GENTTROUGH -- --  GENTPEAK -- --  GENTRANDOM -- --  TOBRATROUGH -- --  TOBRAPEAK -- --  TOBRARND -- --  AMIKACINPEAK -- --  AMIKACINTROU -- --  AMIKACIN -- --     Microbiology: Recent Results (from the past 720 hour(s))  CULTURE, BLOOD (ROUTINE X 2)     Status: Normal (Preliminary result)   Collection Time   12/23/11 11:05 AM      Component Value Range Status Comment   Specimen Description BLOOD ARM LEFT   Final    Special Requests     Final    Value: BOTTLES DRAWN AEROBIC AND ANAEROBIC 10CC AER 4CC ANA   Setup Time [id number]   Final    Culture     Final    Value:        BLOOD CULTURE RECEIVED NO GROWTH TO DATE CULTURE WILL BE HELD FOR 5 DAYS BEFORE ISSUING A FINAL NEGATIVE REPORT   Report Status PENDING   Incomplete   CULTURE, BLOOD (ROUTINE X 2)     Status: Normal (Preliminary result)   Collection Time   12/23/11 11:10 AM      Component Value Range Status Comment   Specimen Description BLOOD RIGHT HAND   Final    Special Requests BOTTLES DRAWN AEROBIC ONLY  5CC   Final    Setup Time [id number]   Final    Culture     Final    Value:        BLOOD CULTURE RECEIVED NO GROWTH TO DATE CULTURE WILL BE HELD FOR 5 DAYS BEFORE ISSUING A FINAL NEGATIVE REPORT   Report Status PENDING   Incomplete   ANAEROBIC CULTURE     Status: Normal (Preliminary result)   Collection Time   12/23/11  3:38 PM      Component Value Range Status Comment   Specimen Description ABSCESS THUMB LEFT   Final    Special Requests NONE   Final    Gram Stain     Final    Value: ABUNDANT WBC PRESENT,BOTH PMN AND MONONUCLEAR     ABUNDANT GRAM POSITIVE COCCI     IN PAIRS IN CLUSTERS Performed at [facility] Gram Stain Report Called to,Read Back By and Verified With: Gram Stain Report Called to,Read Back By and Verified With: DR HEWITT [id number] 12/23/11 A BROWNING   Culture     Final    Value: NO ANAEROBES ISOLATED; CULTURE IN PROGRESS FOR 5 DAYS   Report Status PENDING   Incomplete   CULTURE, ROUTINE-ABSCESS  Status: Normal (Preliminary result)   Collection Time   12/23/11  3:38 PM      Component Value Range Status Comment   Specimen Description ABSCESS THUMB LEFT   Final    Special Requests NONE   Final    Gram Stain     Final    Value: ABUNDANT WBC PRESENT,BOTH PMN AND MONONUCLEAR     ABUNDANT GRAM POSITIVE COCCI     IN PAIRS IN CLUSTERS Performed at [facility] Gram Stain Report Called to,Read Back By and Verified With: Gram Stain Report Called to,Read Back By and Verified With: DR HEWITT [id number] 12/23/11 A BROWNING   Culture Culture reincubated for better growth   Final    Report Status PENDING   Incomplete   GRAM STAIN     Status: Normal   Collection Time   12/23/11  3:38 PM      Component Value Range Status Comment   Specimen Description ABSCESS THUMB LEFT   Final    Special Requests NONE   Final    Gram Stain     Final    Value: ABUNDANT WBC PRESENT,BOTH PMN AND MONONUCLEAR     ABUNDANT GRAM POSITIVE COCCI IN CLUSTERS IN PAIRS     Gram Stain Report Called  to,Read Back By and Verified With: Dr Doran Durand 1617 12/23/11 A Browning   Report Status 12/23/2011 FINAL   Final   ANAEROBIC CULTURE     Status: Normal (Preliminary result)   Collection Time   12/23/11  3:46 PM      Component Value Range Status Comment   Specimen Description ABSCESS THUMB LEFT   Final    Special Requests NONE   Final    Gram Stain     Final    Value: NO WBC SEEN     ABUNDANT SQUAMOUS EPITHELIAL CELLS PRESENT     ABUNDANT GRAM POSITIVE COCCI     IN PAIRS ABUNDANT GRAM POSITIVE RODS     ABUNDANT GRAM NEGATIVE RODS   Culture     Final    Value: NO ANAEROBES ISOLATED; CULTURE IN PROGRESS FOR 5 DAYS   Report Status PENDING   Incomplete   CULTURE, ROUTINE-ABSCESS     Status: Normal (Preliminary result)   Collection Time   12/23/11  3:46 PM      Component Value Range Status Comment   Specimen Description ABSCESS THUMB LEFT   Final    Special Requests NONE   Final    Gram Stain     Final    Value: NO WBC SEEN     ABUNDANT SQUAMOUS EPITHELIAL CELLS PRESENT     ABUNDANT GRAM POSITIVE COCCI     IN PAIRS ABUNDANT GRAM POSITIVE RODS     FEW GRAM NEGATIVE RODS   Culture NO GROWTH 1 DAY   Final    Report Status PENDING   Incomplete     Anti-infectives     Start     Dose/Rate Route Frequency Ordered Stop   12/26/11 0600   vancomycin (VANCOCIN) IVPB 1000 mg/200 mL premix        1,000 mg 200 mL/hr over 60 Minutes Intravenous Every 24 hours 12/25/11 1440     12/23/11 2000   vancomycin (VANCOCIN) IVPB 1000 mg/200 mL premix  Status:  Discontinued     Comments: Pharmacy to dose      1,000 mg 200 mL/hr over 60 Minutes Intravenous Every 8 hours 12/23/11 1703 12/24/11 2026   12/23/11 2000  piperacillin-tazobactam (ZOSYN) IVPB 3.375 g        3.375 g 12.5 mL/hr over 240 Minutes Intravenous 3 times per day 12/23/11 1703     12/23/11 1045   vancomycin (VANCOCIN) IVPB 1000 mg/200 mL premix        1,000 mg 200 mL/hr over 60 Minutes Intravenous  Once 12/23/11 1042 12/23/11 1221    12/23/11 1045   piperacillin-tazobactam (ZOSYN) IVPB 4.5 g        4.5 g 200 mL/hr over 30 Minutes Intravenous  Once 12/23/11 1042 12/23/11 1306          Assessment: 46 yo F on day#3 IV Vancomycin and Zosyn for thumb infection.  Despite young age and CrCL~ 100 ml/min it appears this patient is not clearing Vancomycin as well as expected. Vanc levels have been supratherapeutic x2 after pt started on Vancomycin 1gm IV q8h.  Vancomycin dose held yesterday afternoon after elevated trough. Repeat random level this morning still >20.  Using change in concentration of vancomycin over time, pts estimated Ke=0.027 with t1/2=25 hrs.    Goal of Therapy:  Vancomycin trough level 15-20 mcg/ml  Plan:  1.  Resume vancomycin 1gm IV q24hrs to begin at 6am tomorrow (estimate that vanc level will be approx 15 at that time).  2.  F/U cx data, repeat trough level at steady-state.  Netta Cedars, PharmD, BCPS  12/25/2011,2:40 PM

## 2011-12-25 NOTE — Op Note (Signed)
[name] NO.:  [id number]  MEDICAL RECORD NO.:  [id number]  LOCATION:  [id number]                         FACILITY:  [facility]  PHYSICIAN:  Leanora Cover, MD        DATE OF BIRTH:  [birth year]  DATE OF PROCEDURE:  12/24/2011 DATE OF DISCHARGE:                              OPERATIVE REPORT   PREOPERATIVE DIAGNOSIS:  Gas gangrene, left thumb.  POSTOPERATIVE DIAGNOSIS:  Gas gangrene, left thumb.  PROCEDURE:  Left thumb irrigation and debridement and amputation through proximal phalanx.  SURGEON:  Leanora Cover, M.D.  ASSISTANTS:  Charlotte Crumb, M.D.  ANESTHESIA:  General.  IV FLUIDS:  Per anesthesia flow sheet.  ESTIMATED BLOOD LOSS:  Minimal.  COMPLICATIONS:  None.  SPECIMENS:  Left thumb to Pathology.  TOURNIQUET TIME:  None.  DISPOSITION:  Stable to PACU.  INDICATIONS:  [name] is a 46 year old right-hand-dominant female who one week ago began to have swelling, pain, and erythema of her left thumb.  She presented to the emergency department yesterday and where she was found to have a infection of the thumb with gas on radiographs. She was taken to the operating room yesterday for emergent irrigation and debridement of the thumb.  She has returned today for repeat irrigation and debridement and potential amputation of the thumb. Risks, benefits, and alternatives of the surgery were discussed including risk of blood loss; infection; damage to nerves, vessels, tendons, ligaments, bone; failure of surgery; need for additional surgery; complications with wound healing; continued pain; need for repeat irrigation and debridement or further amputation.  She voiced understanding of these risks and elected to proceed.  OPERATIVE COURSE:  After being identified preoperatively by myself, the patient and I agreed upon the procedure and site of the procedure. Surgical site was marked.  The risks, benefits, alternatives of the surgery were reviewed and  she wished to proceed.  A surgical consent was signed.  She was on scheduled IV antibiotics.  She was transferred to the operating room and placed on the operating table in supine position with left upper extremity on arm board.  General anesthesia was induced by the anesthesiologist.  Left upper extremity was prepped and draped in normal sterile orthopedic fashion.  A surgical pause was performed between surgeons, anesthesia, and operating room staff and all were in agreement with the patient, procedure, and site of the procedure.  A tourniquet was placed to the proximal aspect of the extremity, but never inflated.  The wound was explored.  The distal aspect of the thumb was cold.  Cutting into the skin provided no bleeding.  The proximal wound looked good with no gross purulence.  A needle was used along the edges of the wound to determine where there would be active blood flow.  The skin was relatively demarcated.  This was at approximately the midpoint of the proximal phalanx.  It was felt that it was appropriate to amputate the thumb at this level as distal tissues were non-viable.   The skin was removed sharply.  The flexor and extensor tendons were sharply divided.  A saw was used to perform amputation through the middle of the proximal phalanx.  There  was no bleeding from the bone after sectioning of the bone.  The wound was copiously irrigated with a 1000 mL of sterile saline by bulb syringe.  Two 4-0 nylon sutures were thrown to provide soft tissue coverage over the end of the bone.  The wound was packed with quarter- inch iodoform gauze.  The wound was then dressed with sterile Xeroform, 4x4s, and wrapped with Kerlix and Ace bandage.  The operative drapes were broken down and the patient was awoken from anesthesia safely.  She was transferred back to stretcher and taken to PACU in stable condition. We will continue her on IV antibiotics.  The amputated thumb was sent  to Pathology for examination.  I will plan her return to the operating room in approximately 48 hours for repeat irrigation and debridement, potential removal of the thumb through the IP joint or more proximally if necessary.     Leanora Cover, MD     KK/MEDQ  D:  12/24/2011  T:  12/25/2011  Job:  [id number]

## 2011-12-25 NOTE — Progress Notes (Signed)
Subjective: Patient feels better, no complaints. No fever or chills.  Patient tolerated surgery well. Will have repeat surgery tomorrow.   Objective: Vital signs in last 24 hours: Filed Vitals:   12/24/11 1733 12/24/11 1736 12/24/11 2114 12/25/11 0530  BP: 116/58  132/71 111/67  Pulse:  88 96 85  Temp:   99.3 F (37.4 C) 98.3 F (36.8 C)  TempSrc:   Oral Oral  Resp:  11 16 18   Height:      Weight:      SpO2:  97% 95% 98%   Weight change:   Intake/Output Summary (Last 24 hours) at 12/25/11 0923 Last data filed at 12/25/11 0700  Gross per 24 hour  Intake   1033 ml  Output    420 ml  Net    613 ml   Physical Exam: General: resting in bed, not in acute distress HEENT: PERRL, EOMI, no scleral icterus Cardiac: S1/S2, RRR, No murmurs, gallops or rubs Pulm: Good air movement bilaterally, Clear to auscultation bilaterally, No rales, wheezing, rhonchi or rubs. Abd: Soft,  nondistended, nontender, no rebound pain, no organomegaly, BS present Ext: No rashes or edema, 1+DP/PT pulse bilaterally. Right index finger amputation. Left thumb is dressed without draining.   Patient legs have several skin lesions. Left anterior leg has two small skin lesions which have denuded skin and look like infected.   Her right leg has 2 X 2 cm skin lesion on the lateral side of leg, which look like infected. There is pus on the surface of the skin lesion. Patient reports no pain over the lesions.  Neuro: alert and oriented X3, cranial nerves II-XII grossly intact, muscle strength 5/5 in all extremeties,  sensation to light touch intact.      Lab Results: Basic Metabolic Panel:  Lab 0000000 1134  NA 133*  K 5.3*  CL 96  CO2 23  GLUCOSE 363*  BUN 14  CREATININE 0.79  CALCIUM 9.4  MG --  PHOS --     Lab 12/24/11 0525 12/23/11 1134  WBC 17.8* 16.8*  NEUTROABS -- 14.3*  HGB 8.9* 11.0*  HCT 28.2* 34.6*  MCV 76.4* 75.9*  PLT 333 359  CBG:  Lab 12/25/11 0410 12/25/11 12/24/11 2016  12/24/11 1718 12/24/11 1601 12/24/11 1221  GLUCAP 136* 194* 255* 232* 255* 250*   Hemoglobin A1C:  Lab 12/23/11 1930  HGBA1C 12.3*     Micro Results: Recent Results (from the past 240 hour(s))  CULTURE, BLOOD (ROUTINE X 2)     Status: Normal (Preliminary result)   Collection Time   12/23/11 11:05 AM      Component Value Range Status Comment   Specimen Description BLOOD ARM LEFT   Final    Special Requests     Final    Value: BOTTLES DRAWN AEROBIC AND ANAEROBIC 10CC AER 4CC ANA   Setup Time [id number]   Final    Culture     Final    Value:        BLOOD CULTURE RECEIVED NO GROWTH TO DATE CULTURE WILL BE HELD FOR 5 DAYS BEFORE ISSUING A FINAL NEGATIVE REPORT   Report Status PENDING   Incomplete   CULTURE, BLOOD (ROUTINE X 2)     Status: Normal (Preliminary result)   Collection Time   12/23/11 11:10 AM      Component Value Range Status Comment   Specimen Description BLOOD RIGHT HAND   Final    Special Requests BOTTLES DRAWN AEROBIC ONLY 5CC  Final    Setup Time [id number]   Final    Culture     Final    Value:        BLOOD CULTURE RECEIVED NO GROWTH TO DATE CULTURE WILL BE HELD FOR 5 DAYS BEFORE ISSUING A FINAL NEGATIVE REPORT   Report Status PENDING   Incomplete   ANAEROBIC CULTURE     Status: Normal (Preliminary result)   Collection Time   12/23/11  3:38 PM      Component Value Range Status Comment   Specimen Description ABSCESS THUMB LEFT   Final    Special Requests NONE   Final    Gram Stain     Final    Value: ABUNDANT WBC PRESENT,BOTH PMN AND MONONUCLEAR     ABUNDANT GRAM POSITIVE COCCI     IN PAIRS IN CLUSTERS Performed at [facility] Gram Stain Report Called to,Read Back By and Verified With: Gram Stain Report Called to,Read Back By and Verified With: DR HEWITT [id number] 12/23/11 A BROWNING   Culture PENDING   Incomplete    Report Status PENDING   Incomplete   CULTURE, ROUTINE-ABSCESS     Status: Normal (Preliminary result)   Collection Time   12/23/11  3:38 PM        Component Value Range Status Comment   Specimen Description ABSCESS THUMB LEFT   Final    Special Requests NONE   Final    Gram Stain     Final    Value: ABUNDANT WBC PRESENT,BOTH PMN AND MONONUCLEAR     ABUNDANT GRAM POSITIVE COCCI     IN PAIRS IN CLUSTERS Performed at [facility] Gram Stain Report Called to,Read Back By and Verified With: Gram Stain Report Called to,Read Back By and Verified With: DR HEWITT [id number] 12/23/11 A BROWNING   Culture NO GROWTH   Final    Report Status PENDING   Incomplete   GRAM STAIN     Status: Normal   Collection Time   12/23/11  3:38 PM      Component Value Range Status Comment   Specimen Description ABSCESS THUMB LEFT   Final    Special Requests NONE   Final    Gram Stain     Final    Value: ABUNDANT WBC PRESENT,BOTH PMN AND MONONUCLEAR     ABUNDANT GRAM POSITIVE COCCI IN CLUSTERS IN PAIRS     Gram Stain Report Called to,Read Back By and Verified With: Dr Doran Durand 1617 12/23/11 A Browning   Report Status 12/23/2011 FINAL   Final   ANAEROBIC CULTURE     Status: Normal (Preliminary result)   Collection Time   12/23/11  3:46 PM      Component Value Range Status Comment   Specimen Description ABSCESS THUMB LEFT   Final    Special Requests NONE   Final    Gram Stain     Final    Value: NO WBC SEEN     ABUNDANT SQUAMOUS EPITHELIAL CELLS PRESENT     ABUNDANT GRAM POSITIVE COCCI     IN PAIRS ABUNDANT GRAM POSITIVE RODS     ABUNDANT GRAM NEGATIVE RODS   Culture PENDING   Incomplete    Report Status PENDING   Incomplete   CULTURE, ROUTINE-ABSCESS     Status: Normal (Preliminary result)   Collection Time   12/23/11  3:46 PM      Component Value Range Status Comment   Specimen Description ABSCESS THUMB LEFT   Final  Special Requests NONE   Final    Gram Stain     Final    Value: NO WBC SEEN     ABUNDANT SQUAMOUS EPITHELIAL CELLS PRESENT     ABUNDANT GRAM POSITIVE COCCI     IN PAIRS ABUNDANT GRAM POSITIVE RODS     FEW GRAM NEGATIVE RODS    Culture NO GROWTH   Final    Report Status PENDING   Incomplete    Studies/Results: Dg Hand Complete Left  12/23/2011  *RADIOLOGY REPORT*  Clinical Data: Pain and swelling; diabetes  LEFT HAND - COMPLETE 3+ VIEW  Comparison: August 18, 2011  Findings: There is a large amount of gas within the soft tissues of the left thumb.  There is no evidence of osseous erosion or periosteal reaction to suggest osteomyelitis at this time.  There has been amputation of the third distal phalanx.  IMPRESSION: Marked  subcutaneous emphysema involving the left thumb which is consistent with cellulitis from a gas-forming organism.  Original Report Authenticated By: Duayne Cal, M.D.   Medications:  Scheduled Meds:    . amLODipine  10 mg Oral Daily  . collagenase   Topical Daily  . ferrous sulfate  325 mg Oral BID WC  . folic acid  1 mg Oral Daily  . insulin aspart  0-15 Units Subcutaneous Q4H  . insulin glargine  25 Units Subcutaneous QHS  . living well with diabetes book   Does not apply Once  . mulitivitamin with minerals  1 tablet Oral Daily  . piperacillin-tazobactam (ZOSYN)  IV  3.375 g Intravenous Q8H  . thiamine  100 mg Oral Daily   Or  . thiamine  100 mg Intravenous Daily  . DISCONTD: insulin aspart  0-5 Units Subcutaneous QHS  . DISCONTD: insulin aspart  18 Units Subcutaneous BID  . DISCONTD: insulin glargine  20 Units Subcutaneous QHS  . DISCONTD: vancomycin  1,000 mg Intravenous Q8H   Continuous Infusions:  PRN Meds:.diphenhydrAMINE, HYDROmorphone, HYDROmorphone, LORazepam, LORazepam, meperidine, methocarbamol(ROBAXIN) IV, methocarbamol, metoCLOPramide (REGLAN) injection, metoCLOPramide, ondansetron (ZOFRAN) IV, ondansetron, oxyCODONE-acetaminophen, promethazine, DISCONTD: sodium chloride irrigation Assessment/Plan:   # Necrotizing fasciitis: patient's swelling thumb with redness and tenderness is most likely casued by necrotizing fasciitis.  X-ray showed that there is no evidence of  ossteous erosion or periosteal reaction to suggest osteomyelitis at this time. But with marked  subcutaneous emphysema involving the left thumb which is consistent with cellulitis from a gas-forming organism. Patient still has leukocytosis.  -Consulted orthopedic surgeon. Appreciate  Orthopedic's help in managing our patient. Dr Fredna Dow saw patient and did IRRIGATION AND DEBRIDEMENT along with partial amputation on 1/18. -continue IV zosyn and Vancomycin, pending culture and sensitivity -pain control with Dilaudid -will repeat CBC and BMET  # DM-II:  Poorly controlled. A1c is 12.3 on this admission. Patient supposes to use Novolog 18 U twice daily at home. But she only used once daily at the best.  -CBG's getting better. -change Lantus to  25 U daily and SSI-moderate sensitivity.  -S/C the scheduled Novolg twice daily  # HTN: Bp well-controlled. Will continue her home dose amlodipine 10 mg daily.  # Alcohol abuse: Drinks 80 ounce of beer/day, last drinking was day prior to admission. Currently no withdraw symptoms.  -will continue CIWA protocol -will continue VB1 and folate   #: skin lesions: will get wound care consult.   # DVT PPX: SCD       LOS: 2 days   Torre Pikus 12/25/2011, 9:23 AM

## 2011-12-26 ENCOUNTER — Encounter (HOSPITAL_COMMUNITY): Payer: Self-pay | Admitting: Orthopedic Surgery

## 2011-12-26 ENCOUNTER — Encounter (HOSPITAL_COMMUNITY): Payer: Self-pay

## 2011-12-26 ENCOUNTER — Encounter (HOSPITAL_COMMUNITY)
Admission: EM | Disposition: A | Discharge status: Home / Self Care | Payer: Self-pay | Source: Home / Self Care | Attending: Infectious Diseases

## 2011-12-26 ENCOUNTER — Inpatient Hospital Stay (HOSPITAL_COMMUNITY): Payer: Self-pay

## 2011-12-26 HISTORY — PX: I & D EXTREMITY: SHX5045

## 2011-12-26 LAB — BASIC METABOLIC PANEL
BUN: 18 mg/dL (ref 6–23)
CO2: 26 mEq/L (ref 19–32)
Calcium: 7.9 mg/dL — ABNORMAL LOW (ref 8.4–10.5)
Chloride: 102 mEq/L (ref 96–112)
Creatinine, Ser: 1.19 mg/dL — ABNORMAL HIGH (ref 0.50–1.10)
GFR calc Af Amer: 63 mL/min — ABNORMAL LOW (ref >90)
GFR calc non Af Amer: 55 mL/min — ABNORMAL LOW (ref >90)
Glucose, Bld: 173 mg/dL — ABNORMAL HIGH (ref 70–99)
Potassium: 4.7 mEq/L (ref 3.5–5.1)
Sodium: 135 mEq/L (ref 135–145)

## 2011-12-26 LAB — CBC
HCT: 25.6 % — ABNORMAL LOW (ref 36.0–46.0)
Hemoglobin: 8 g/dL — ABNORMAL LOW (ref 12.0–15.0)
MCH: 24.3 pg — ABNORMAL LOW (ref 26.0–34.0)
MCHC: 31.3 g/dL (ref 30.0–36.0)
MCV: 77.8 fL — ABNORMAL LOW (ref 78.0–100.0)
Platelets: 411 10*3/uL — ABNORMAL HIGH (ref 150–400)
RBC: 3.29 MIL/uL — ABNORMAL LOW (ref 3.87–5.11)
RDW: 13.1 % (ref 11.5–15.5)
WBC: 12.2 10*3/uL — ABNORMAL HIGH (ref 4.0–10.5)

## 2011-12-26 LAB — GLUCOSE, CAPILLARY
Glucose-Capillary: 231 mg/dL — ABNORMAL HIGH (ref 70–99)
Glucose-Capillary: 194 mg/dL — ABNORMAL HIGH (ref 70–99)
Glucose-Capillary: 213 mg/dL — ABNORMAL HIGH (ref 70–99)
Glucose-Capillary: 160 mg/dL — ABNORMAL HIGH (ref 70–99)
Glucose-Capillary: 160 mg/dL — ABNORMAL HIGH (ref 70–99)
Glucose-Capillary: 225 mg/dL — ABNORMAL HIGH (ref 70–99)
Glucose-Capillary: 106 mg/dL — ABNORMAL HIGH (ref 70–99)

## 2011-12-26 SURGERY — IRRIGATION AND DEBRIDEMENT EXTREMITY
Anesthesia: General | Site: Thumb | Laterality: Left | Wound class: Contaminated

## 2011-12-26 MED ORDER — MIDAZOLAM HCL 5 MG/5ML IJ SOLN
INTRAMUSCULAR | Status: DC | PRN
  Administered 2011-12-26: 2 mg via INTRAVENOUS

## 2011-12-26 MED ORDER — SODIUM CHLORIDE 0.9 % IV SOLN
INTRAVENOUS | Status: DC | PRN
  Administered 2011-12-26: 08:00:00 via INTRAVENOUS

## 2011-12-26 MED ORDER — FENTANYL CITRATE 0.05 MG/ML IJ SOLN
INTRAMUSCULAR | Status: DC | PRN
  Administered 2011-12-26: 100 ug via INTRAVENOUS

## 2011-12-26 MED ORDER — SODIUM CHLORIDE 0.9 % IR SOLN
Status: DC | PRN
  Administered 2011-12-26: 1

## 2011-12-26 MED ORDER — PROPOFOL 10 MG/ML IV EMUL
INTRAVENOUS | Status: DC | PRN
  Administered 2011-12-26: 150 mg via INTRAVENOUS

## 2011-12-26 MED ORDER — ONDANSETRON HCL 4 MG/2ML IJ SOLN: 4.0000 mg | Freq: Once | INTRAMUSCULAR | Status: DC | PRN

## 2011-12-26 MED ORDER — HYDROMORPHONE HCL PF 1 MG/ML IJ SOLN
0.2500 mg | INTRAMUSCULAR | Status: DC | PRN
  Administered 2011-12-26 (×2): 0.5 mg via INTRAVENOUS

## 2011-12-26 MED ORDER — ONDANSETRON HCL 4 MG/2ML IJ SOLN
INTRAMUSCULAR | Status: DC | PRN
  Administered 2011-12-26: 4 mg via INTRAVENOUS

## 2011-12-26 MED ORDER — MORPHINE SULFATE 2 MG/ML IJ SOLN: 0.0500 mg/kg | INTRAMUSCULAR | Status: DC | PRN

## 2011-12-26 MED ORDER — MEPERIDINE HCL 25 MG/ML IJ SOLN: 6.2500 mg | INTRAMUSCULAR | Status: DC | PRN

## 2011-12-26 SURGICAL SUPPLY — 71 items
BANDAGE COBAN STERILE 2 (GAUZE/BANDAGES/DRESSINGS) IMPLANT
BANDAGE CONFORM 2  STR LF (GAUZE/BANDAGES/DRESSINGS) IMPLANT
BANDAGE ELASTIC 3 VELCRO ST LF (GAUZE/BANDAGES/DRESSINGS) ×3 IMPLANT
BANDAGE ELASTIC 4 VELCRO ST LF (GAUZE/BANDAGES/DRESSINGS) ×3 IMPLANT
BANDAGE GAUZE ELAST BULKY 4 IN (GAUZE/BANDAGES/DRESSINGS) ×3 IMPLANT
BLADE LONG MED 31X9 (MISCELLANEOUS) ×1 IMPLANT
BNDG CMPR 9X4 STRL LF SNTH (GAUZE/BANDAGES/DRESSINGS)
BNDG COHESIVE 1X5 TAN STRL LF (GAUZE/BANDAGES/DRESSINGS) IMPLANT
BNDG COHESIVE 3X5 TAN STRL LF (GAUZE/BANDAGES/DRESSINGS) IMPLANT
BNDG ESMARK 4X9 LF (GAUZE/BANDAGES/DRESSINGS) ×1 IMPLANT
CLOTH BEACON ORANGE TIMEOUT ST (SAFETY) ×1 IMPLANT
CORDS BIPOLAR (ELECTRODE) ×3 IMPLANT
COVER SURGICAL LIGHT HANDLE (MISCELLANEOUS) ×3 IMPLANT
CUFF TOURNIQUET SINGLE 18IN (TOURNIQUET CUFF) ×3 IMPLANT
CUFF TOURNIQUET SINGLE 24IN (TOURNIQUET CUFF) IMPLANT
DECANTER SPIKE VIAL GLASS SM (MISCELLANEOUS) ×3 IMPLANT
DRAIN PENROSE 1/4X12 LTX STRL (WOUND CARE) IMPLANT
DRSG ADAPTIC 3X8 NADH LF (GAUZE/BANDAGES/DRESSINGS) IMPLANT
DRSG EMULSION OIL 3X3 NADH (GAUZE/BANDAGES/DRESSINGS) ×1 IMPLANT
DRSG KUZMA FLUFF (GAUZE/BANDAGES/DRESSINGS) IMPLANT
DRSG PAD ABDOMINAL 8X10 ST (GAUZE/BANDAGES/DRESSINGS) ×2 IMPLANT
DURAPREP 26ML APPLICATOR (WOUND CARE) ×1 IMPLANT
GAUZE PACKING IODOFORM 1/4X5 (PACKING) ×2 IMPLANT
GAUZE XEROFORM 1X8 LF (GAUZE/BANDAGES/DRESSINGS) ×1 IMPLANT
GLOVE BIO SURGEON STRL SZ 6.5 (GLOVE) ×1 IMPLANT
GLOVE BIO SURGEON STRL SZ7.5 (GLOVE) ×3 IMPLANT
GLOVE BIOGEL PI IND STRL 8 (GLOVE) ×2 IMPLANT
GLOVE BIOGEL PI INDICATOR 8 (GLOVE) ×1
GLOVE SURG ORTHO 8.0 STRL STRW (GLOVE) ×3 IMPLANT
GOWN PREVENTION PLUS XLARGE (GOWN DISPOSABLE) ×3 IMPLANT
GOWN STRL NON-REIN LRG LVL3 (GOWN DISPOSABLE) ×6 IMPLANT
GOWN STRL REIN XL XLG (GOWN DISPOSABLE) ×3 IMPLANT
HANDPIECE INTERPULSE COAX TIP (DISPOSABLE)
KIT BASIN OR (CUSTOM PROCEDURE TRAY) ×3 IMPLANT
KIT ROOM TURNOVER OR (KITS) ×3 IMPLANT
LOOP VESSEL MAXI BLUE (MISCELLANEOUS) IMPLANT
LOOP VESSEL MINI RED (MISCELLANEOUS) IMPLANT
MANIFOLD NEPTUNE II (INSTRUMENTS) ×3 IMPLANT
NDL HYPO 25GX1X1/2 BEV (NEEDLE) IMPLANT
NDL HYPO 25X1 1.5 SAFETY (NEEDLE) IMPLANT
NEEDLE HYPO 25GX1X1/2 BEV (NEEDLE) IMPLANT
NEEDLE HYPO 25X1 1.5 SAFETY (NEEDLE) IMPLANT
NS IRRIG 1000ML POUR BTL (IV SOLUTION) ×3 IMPLANT
PACK ORTHO EXTREMITY (CUSTOM PROCEDURE TRAY) ×3 IMPLANT
PAD ARMBOARD 7.5X6 YLW CONV (MISCELLANEOUS) ×6 IMPLANT
PAD CAST 4YDX4 CTTN HI CHSV (CAST SUPPLIES) IMPLANT
PADDING CAST COTTON 4X4 STRL (CAST SUPPLIES)
RUBBERBAND STERILE (MISCELLANEOUS) ×1 IMPLANT
SCRUB BETADINE 4OZ XXX (MISCELLANEOUS) ×3 IMPLANT
SET HNDPC FAN SPRY TIP SCT (DISPOSABLE) IMPLANT
SOLUTION BETADINE 4OZ (MISCELLANEOUS) ×3 IMPLANT
SPECIMEN JAR SMALL (MISCELLANEOUS) ×1 IMPLANT
SPONGE GAUZE 4X4 12PLY (GAUZE/BANDAGES/DRESSINGS) ×3 IMPLANT
SPONGE LAP 18X18 X RAY DECT (DISPOSABLE) ×1 IMPLANT
SPONGE LAP 4X18 X RAY DECT (DISPOSABLE) ×1 IMPLANT
SPONGE SCRUB IODOPHOR (GAUZE/BANDAGES/DRESSINGS) ×3 IMPLANT
SUCTION FRAZIER TIP 10 FR DISP (SUCTIONS) ×3 IMPLANT
SUT ETHILON 4 0 PS 2 18 (SUTURE) ×3 IMPLANT
SUT ETHILON 5 0 PS 2 18 (SUTURE) IMPLANT
SUT MON AB 5-0 P3 18 (SUTURE) IMPLANT
SUT SILK 4 0 PS 2 (SUTURE) IMPLANT
SUT VICRYL 4-0 PS2 18IN ABS (SUTURE) IMPLANT
SYR CONTROL 10ML LL (SYRINGE) IMPLANT
TOWEL OR 17X24 6PK STRL BLUE (TOWEL DISPOSABLE) ×3 IMPLANT
TOWEL OR 17X26 10 PK STRL BLUE (TOWEL DISPOSABLE) ×3 IMPLANT
TUBE ANAEROBIC SPECIMEN COL (MISCELLANEOUS) IMPLANT
TUBE CONNECTING 12X1/4 (SUCTIONS) ×3 IMPLANT
TUBE FEEDING 5FR 15 INCH (TUBING) IMPLANT
UNDERPAD 30X30 INCONTINENT (UNDERPADS AND DIAPERS) ×3 IMPLANT
WATER STERILE IRR 1000ML POUR (IV SOLUTION) ×3 IMPLANT
YANKAUER SUCT BULB TIP NO VENT (SUCTIONS) ×3 IMPLANT

## 2011-12-26 NOTE — H&P (Signed)
  Patient seen and examined.  No interval changes.

## 2011-12-26 NOTE — Plan of Care (Signed)
Problem: Problem: Diabetes Management Progression Goal: INCREASE DIABETES KNOWLEDGE Pt. given a diabetic book and encouraged to watch diabetic videos.

## 2011-12-26 NOTE — Preoperative (Signed)
Beta Blockers   Reason not to administer Beta Blockers:Not Applicable 

## 2011-12-26 NOTE — Op Note (Signed)
[name], [name] NO.:  [id number]  MEDICAL RECORD NO.:  [id number]  LOCATION:  [facility]                         FACILITY:  [facility]  PHYSICIAN:  Leanora Cover, MD        DATE OF BIRTH:  1966-08-01  DATE OF PROCEDURE:  12/26/2011 DATE OF DISCHARGE:                              OPERATIVE REPORT   PREOPERATIVE DIAGNOSIS:  Left thumb gas gangrene.  POSTOPERATIVE DIAGNOSIS:  Left thumb gas gangrene.  PROCEDURE:  Irrigation and debridement, left thumb.  SURGEON:  Leanora Cover, MD  ASSISTANTS:  Sheral Apley. Burney Gauze, MD  ANESTHESIA:  General.  INTRAVENOUS FLUIDS:  Per anesthesia flow sheet.  ESTIMATED BLOOD LOSS:  Minimal.  COMPLICATIONS:  None.  SPECIMENS:  None.  TOURNIQUET TIME:  None.  DISPOSITION:  Stable to PACU.  INDICATIONS:  [name] is a 46 year old right-hand-dominant female who has undergone 2 irrigation and debridement and amputation of the thumb through the proximal phalanx for gas gangrene of the thumb.  She has been on IV antibiotics.  She has been afebrile.  Her white count is trending down.  She is returning to the operating room today for repeat irrigation and debridement and potential further amputation.  Risks, benefits, and alternatives of surgery were discussed including risks of blood loss, infection, damage to nerves, vessels, tendons, ligaments, and bone, failure of surgery, need for additional surgery, complications with wound healing, continued pain, continued infection, and need for repeat irrigation and debridement.  She voiced understanding these risks and elected to proceed.  OPERATIVE COURSE:  After being identified preoperatively by myself, the Patient and I agreed upon procedure and site of procedure.  The surgical site was marked.  Risks, benefits, and alternatives of surgery were reviewed and she wished to proceed.  Surgical consent had been signed. She was on scheduled antibiotics.  She was transferred to the  operating room and placed on the operative table in supine position with the left upper extremity on an arm board.  General anesthesia was induced by the anesthesiologist.  The left upper extremity was prepped and draped in a normal sterile orthopedic fashion.  A surgical pause was performed between surgeons, anesthesia, and operating staff and all were in agreement as to the patient, procedure, and site of procedure. Tourniquet was at the proximal aspect of the extremity, but never inflated.  Packing had been removed and the sutures removed.  The wound overall looked very good.  There was some necrotic fat that was debrided using rongeurs.  The skin edges were bleeding well.  All areas of necrotic tissue or fat were debrided.  This was relatively minimal.  The wound was copiously irrigated with 1000 mL of sterile saline.  Four 3-0 nylon sutures were placed in the wound to provide soft tissue coverage over the end of the bone to prevent the skin edges retracting.  The wounds were then packed with quarter-inch iodoform gauze.  They were dressed with sterile 4 x 4's and Kerlix and Ace bandage.  Operative drapes were broken down and the patient was awoken from anesthesia safely.  She was transferred back to stretcher and taken to PACU in stable condition.  She will be  continued on IV antibiotics.  We will have her start hydrotherapy in 2 days for hydrotherapy and packing changes.     Leanora Cover, MD     KK/MEDQ  D:  12/26/2011  T:  12/26/2011  Job:  [id number]

## 2011-12-26 NOTE — Anesthesia Postprocedure Evaluation (Signed)
Anesthesia Post Note  Patient: [name]  Procedure(s) Performed:  IRRIGATION AND DEBRIDEMENT EXTREMITY  Anesthesia type: general  Patient location: PACU  Post pain: Pain level controlled  Post assessment: Patient's Cardiovascular Status Stable  Last Vitals:  Filed Vitals:   12/26/11 0525  BP: 122/72  Pulse: 54  Temp: 36.9 C  Resp: 18    Post vital signs: Reviewed and stable  Level of consciousness: sedated  Complications: No apparent anesthesia complications

## 2011-12-26 NOTE — Op Note (Signed)
Dictation [phone]

## 2011-12-26 NOTE — Progress Notes (Signed)
Subjective: Patient feels better, no complaints. No fever or chills.  Patient tolerated surgery well.     Objective: Vital signs in last 24 hours: Filed Vitals:   12/26/11 1008 12/26/11 1009 12/26/11 1010 12/26/11 1027  BP:   130/81 129/60  Pulse: 78 78 79 80  Temp:   98.3 F (36.8 C) 98.3 F (36.8 C)  TempSrc:      Resp: 12 11 9 16   Height:      Weight:      SpO2: 97% 97% 97% 97%   Weight change:   Intake/Output Summary (Last 24 hours) at 12/26/11 1230 Last data filed at 12/26/11 0915  Gross per 24 hour  Intake   1200 ml  Output    850 ml  Net    350 ml   Physical Exam: General: resting in bed, not in acute distress HEENT: PERRL, EOMI, no scleral icterus Cardiac: S1/S2, RRR, No murmurs, gallops or rubs Pulm: Good air movement bilaterally, Clear to auscultation bilaterally, No rales, wheezing, rhonchi or rubs. Abd: Soft,  nondistended, nontender, no rebound pain, no organomegaly, BS present Ext: No rashes or edema, 1+DP/PT pulse bilaterally. Right index finger amputation. Left thumb is dressed without draining.   Patient legs have several skin lesions. Left anterior leg has two small skin lesions which have denuded skin and look like infected.   Her right leg has 2 X 2 cm skin lesion on the lateral side of leg, which look like infected. There is pus on the surface of the skin lesion. Patient reports no pain over the lesions.  Neuro: alert and oriented X3, cranial nerves II-XII grossly intact, muscle strength 5/5 in all extremeties,  sensation to light touch intact.      Lab Results: Basic Metabolic Panel:  Lab [id number] 1132 12/25/11 0917  NA 135 133*  K 4.7 4.4  CL 102 97  CO2 26 26  GLUCOSE 173* 154*  BUN 18 23  CREATININE 1.19* 1.65*  CALCIUM 7.9* 8.0*  MG -- --  PHOS -- --     Lab 12/26/11 0620 12/25/11 0917 12/23/11 1134  WBC 12.2* 15.1* --  NEUTROABS -- -- 14.3*  HGB 8.0* 8.9* --  HCT 25.6* 28.5* --  MCV 77.8* 76.8* --  PLT 411* 384 --   CBG:  Lab 12/26/11 0944 12/26/11 0404 12/26/11 0125 12/25/11 2047 12/25/11 1717 12/25/11 1230  GLUCAP 160* 194* 231* 196* 175* 178*   Hemoglobin A1C:  Lab 12/23/11 1930  HGBA1C 12.3*     Micro Results: Recent Results (from the past 240 hour(s))  CULTURE, BLOOD (ROUTINE X 2)     Status: Normal (Preliminary result)   Collection Time   12/23/11 11:05 AM      Component Value Range Status Comment   Specimen Description BLOOD ARM LEFT   Final    Special Requests     Final    Value: BOTTLES DRAWN AEROBIC AND ANAEROBIC 10CC AER 4CC ANA   Setup Time [id number]   Final    Culture     Final    Value:        BLOOD CULTURE RECEIVED NO GROWTH TO DATE CULTURE WILL BE HELD FOR 5 DAYS BEFORE ISSUING A FINAL NEGATIVE REPORT   Report Status PENDING   Incomplete   CULTURE, BLOOD (ROUTINE X 2)     Status: Normal (Preliminary result)   Collection Time   12/23/11 11:10 AM      Component Value Range Status Comment   Specimen Description  BLOOD RIGHT HAND   Final    Special Requests BOTTLES DRAWN AEROBIC ONLY 5CC   Final    Setup Time [id number]   Final    Culture     Final    Value:        BLOOD CULTURE RECEIVED NO GROWTH TO DATE CULTURE WILL BE HELD FOR 5 DAYS BEFORE ISSUING A FINAL NEGATIVE REPORT   Report Status PENDING   Incomplete   ANAEROBIC CULTURE     Status: Normal (Preliminary result)   Collection Time   12/23/11  3:38 PM      Component Value Range Status Comment   Specimen Description ABSCESS THUMB LEFT   Final    Special Requests NONE   Final    Gram Stain     Final    Value: ABUNDANT WBC PRESENT,BOTH PMN AND MONONUCLEAR     ABUNDANT GRAM POSITIVE COCCI     IN PAIRS IN CLUSTERS Performed at [facility] Gram Stain Report Called to,Read Back By and Verified With: Gram Stain Report Called to,Read Back By and Verified With: DR HEWITT [id number] 12/23/11 A BROWNING   Culture PENDING   Incomplete    Report Status PENDING   Incomplete   CULTURE, ROUTINE-ABSCESS     Status: Normal  (Preliminary result)   Collection Time   12/23/11  3:38 PM      Component Value Range Status Comment   Specimen Description ABSCESS THUMB LEFT   Final    Special Requests NONE   Final    Gram Stain     Final    Value: ABUNDANT WBC PRESENT,BOTH PMN AND MONONUCLEAR     ABUNDANT GRAM POSITIVE COCCI     IN PAIRS IN CLUSTERS Performed at [facility] Gram Stain Report Called to,Read Back By and Verified With: Gram Stain Report Called to,Read Back By and Verified With: DR HEWITT [id number] 12/23/11 A BROWNING   Culture NO GROWTH   Final    Report Status PENDING   Incomplete   GRAM STAIN     Status: Normal   Collection Time   12/23/11  3:38 PM      Component Value Range Status Comment   Specimen Description ABSCESS THUMB LEFT   Final    Special Requests NONE   Final    Gram Stain     Final    Value: ABUNDANT WBC PRESENT,BOTH PMN AND MONONUCLEAR     ABUNDANT GRAM POSITIVE COCCI IN CLUSTERS IN PAIRS     Gram Stain Report Called to,Read Back By and Verified With: Dr Doran Durand 1617 12/23/11 A Browning   Report Status 12/23/2011 FINAL   Final   ANAEROBIC CULTURE     Status: Normal (Preliminary result)   Collection Time   12/23/11  3:46 PM      Component Value Range Status Comment   Specimen Description ABSCESS THUMB LEFT   Final    Special Requests NONE   Final    Gram Stain     Final    Value: NO WBC SEEN     ABUNDANT SQUAMOUS EPITHELIAL CELLS PRESENT     ABUNDANT GRAM POSITIVE COCCI     IN PAIRS ABUNDANT GRAM POSITIVE RODS     ABUNDANT GRAM NEGATIVE RODS   Culture PENDING   Incomplete    Report Status PENDING   Incomplete   CULTURE, ROUTINE-ABSCESS     Status: Normal (Preliminary result)   Collection Time   12/23/11  3:46 PM  Component Value Range Status Comment   Specimen Description ABSCESS THUMB LEFT   Final    Special Requests NONE   Final    Gram Stain     Final    Value: NO WBC SEEN     ABUNDANT SQUAMOUS EPITHELIAL CELLS PRESENT     ABUNDANT GRAM POSITIVE COCCI     IN PAIRS  ABUNDANT GRAM POSITIVE RODS     FEW GRAM NEGATIVE RODS   Culture NO GROWTH   Final    Report Status PENDING   Incomplete    Studies/Results: Dg Hand Complete Left  12/23/2011  *RADIOLOGY REPORT*  Clinical Data: Pain and swelling; diabetes  LEFT HAND - COMPLETE 3+ VIEW  Comparison: August 18, 2011  Findings: There is a large amount of gas within the soft tissues of the left thumb.  There is no evidence of osseous erosion or periosteal reaction to suggest osteomyelitis at this time.  There has been amputation of the third distal phalanx.  IMPRESSION: Marked  subcutaneous emphysema involving the left thumb which is consistent with cellulitis from a gas-forming organism.  Original Report Authenticated By: Duayne Cal, M.D.   Medications:  Scheduled Meds:    . amLODipine  10 mg Oral Daily  . collagenase   Topical Daily  . ferrous sulfate  325 mg Oral BID WC  . folic acid  1 mg Oral Daily  . insulin aspart  0-15 Units Subcutaneous Q4H  . insulin glargine  25 Units Subcutaneous QHS  . living well with diabetes book   Does not apply Once  . mulitivitamin with minerals  1 tablet Oral Daily  . piperacillin-tazobactam (ZOSYN)  IV  3.375 g Intravenous Q8H  . thiamine  100 mg Oral Daily   Or  . thiamine  100 mg Intravenous Daily  . vancomycin  1,000 mg Intravenous Q24H   Continuous Infusions:  PRN Meds:.diphenhydrAMINE, HYDROmorphone, LORazepam, LORazepam, methocarbamol(ROBAXIN) IV, methocarbamol, metoCLOPramide (REGLAN) injection, metoCLOPramide, ondansetron (ZOFRAN) IV, ondansetron, oxyCODONE-acetaminophen, DISCONTD: HYDROmorphone, DISCONTD: HYDROmorphone, DISCONTD: meperidine, DISCONTD: meperidine, DISCONTD: morphine, DISCONTD: ondansetron (ZOFRAN) IV, DISCONTD: promethazine, DISCONTD: sodium chloride irrigation Assessment/Plan:   # Necrotizing fasciitis: patient's swelling thumb with redness and tenderness is most likely casued by necrotizing fasciitis.  X-ray showed that there is no  evidence of ossteous erosion or periosteal reaction to suggest osteomyelitis at this time. But with marked  subcutaneous emphysema involving the left thumb which is consistent with cellulitis from a gas-forming organism.  Leukocytosis getting better.  -Consulted orthopedic surgeon. Appreciate  Orthopedic's help in managing our patient. Dr Fredna Dow saw patient and did IRRIGATION AND DEBRIDEMENT along with partial amputation on 1/18. Repeat I&D today. -continue IV zosyn and Vancomycin, pending culture and sensitivity -pain control with Dilaudid   # DM-II:  Poorly controlled. A1c is 12.3 on this admission. Patient supposes to use Novolog 18 U twice daily at home. But she only used once daily at the best.  -CBG's getting better. -change Lantus to  25 U daily and SSI-moderate sensitivity.    # HTN: Bp well-controlled. Will continue her home dose amlodipine 10 mg daily.  # Alcohol abuse: Drinks 80 ounce of beer/day, last drinking was day prior to admission. Currently no withdraw symptoms.  -will continue CIWA protocol -will continue VB1 and folate   #: skin lesions:  wound care consult.   # DVT PPX: SCD       LOS: 3 days   Indigo Chaddock 12/26/2011, 12:30 PM

## 2011-12-26 NOTE — Progress Notes (Signed)
Present to place PICC line.  Benefits and risks discussed with PT.  Pt. States does not want PICC at this time.  Will continue with PIV.  If PT. Changes her decision please page the IV team.

## 2011-12-26 NOTE — Progress Notes (Signed)
Cbg=213, not given coverage per anesthesia

## 2011-12-26 NOTE — Transfer of Care (Signed)
Immediate Anesthesia Transfer of Care Note  Patient: [name]  Procedure(s) Performed:  IRRIGATION AND DEBRIDEMENT EXTREMITY  Patient Location: PACU  Anesthesia Type: General  Level of Consciousness: awake, alert  and oriented  Airway & Oxygen Therapy: Patient Spontanous Breathing and Patient connected to nasal cannula oxygen  Post-op Assessment: Report given to PACU RN and Post -op Vital signs reviewed and stable  Post vital signs: Reviewed and stable  Complications: No apparent anesthesia complications

## 2011-12-26 NOTE — Anesthesia Preprocedure Evaluation (Addendum)
Anesthesia Evaluation  Patient identified by MRN, date of birth, ID band Patient awake    Reviewed: Allergy & Precautions, H&P , NPO status , Patient's Chart, lab work & pertinent test results, reviewed documented beta blocker date and time   Airway Mallampati: I TM Distance: <3 FB     Dental  (+) Dental Advidsory Given and Edentulous Upper   Pulmonary          Cardiovascular hypertension,     Neuro/Psych    GI/Hepatic   Endo/Other  Diabetes mellitus-, Poorly Controlled  Renal/GU      Musculoskeletal   Abdominal   Peds  Hematology   Anesthesia Other Findings   Reproductive/Obstetrics                           Anesthesia Physical Anesthesia Plan  ASA: II  Anesthesia Plan: General LMA   Post-op Pain Management:    Induction:   Airway Management Planned:   Additional Equipment:   Intra-op Plan:   Post-operative Plan:   Informed Consent: I have reviewed the patients History and Physical, chart, labs and discussed the procedure including the risks, benefits and alternatives for the proposed anesthesia with the patient or authorized representative who has indicated his/her understanding and acceptance.     Plan Discussed with: CRNA and Surgeon  Anesthesia Plan Comments:         Anesthesia Quick Evaluation

## 2011-12-26 NOTE — Brief Op Note (Signed)
12/23/2011 - 12/26/2011  9:30 AM  PATIENT:  Nyra Jabs  46 y.o. female  PRE-OPERATIVE DIAGNOSIS:  infection left thumb  POST-OPERATIVE DIAGNOSIS:  same   PROCEDURE:  Procedure(s): IRRIGATION AND DEBRIDEMENT EXTREMITY  SURGEON:  Surgeon(s): Tennis Must, MD Schuyler Amor, MD  PHYSICIAN ASSISTANT:   ASSISTANTS: none   ANESTHESIA:   general  EBL:  Total I/O In: 800 [I.V.:800] Out: 50 [Blood:50]  BLOOD ADMINISTERED:none  DRAINS: packing  LOCAL MEDICATIONS USED:  NONE  SPECIMEN:  No Specimen  DISPOSITION OF SPECIMEN:  N/A  COUNTS:  YES  TOURNIQUET:  * No tourniquets in log *  DICTATION: .Other Dictation: Dictation Number [id number]  PLAN OF CARE: Admit to inpatient   PATIENT DISPOSITION:  PACU - hemodynamically stable.   Delay start of Pharmacological VTE agent (>24hrs) due to surgical blood loss or risk of bleeding:  no

## 2011-12-26 NOTE — Anesthesia Procedure Notes (Signed)
Procedure Name: LMA Insertion Date/Time: 12/26/2011 8:47 AM Performed by: Neldon Newport Pre-anesthesia Checklist: Patient identified, Timeout performed, Emergency Drugs available, Suction available and Patient being monitored Patient Re-evaluated:Patient Re-evaluated prior to inductionOxygen Delivery Method: Circle System Utilized Preoxygenation: Pre-oxygenation with 100% oxygen Intubation Type: IV induction Ventilation: Mask ventilation without difficulty LMA: LMA inserted LMA Size: 3.0 Number of attempts: 2 Placement Confirmation: breath sounds checked- equal and bilateral,  positive ETCO2 and CO2 detector Tube secured with: Tape Dental Injury: Teeth and Oropharynx as per pre-operative assessment

## 2011-12-27 LAB — CBC
HCT: 25.9 % — ABNORMAL LOW (ref 36.0–46.0)
Hemoglobin: 7.9 g/dL — ABNORMAL LOW (ref 12.0–15.0)
MCH: 23.7 pg — ABNORMAL LOW (ref 26.0–34.0)
MCHC: 30.5 g/dL (ref 30.0–36.0)
MCV: 77.8 fL — ABNORMAL LOW (ref 78.0–100.0)
Platelets: 402 10*3/uL — ABNORMAL HIGH (ref 150–400)
RBC: 3.33 MIL/uL — ABNORMAL LOW (ref 3.87–5.11)
RDW: 13.1 % (ref 11.5–15.5)
WBC: 9 10*3/uL (ref 4.0–10.5)

## 2011-12-27 LAB — BASIC METABOLIC PANEL
BUN: 15 mg/dL (ref 6–23)
CO2: 24 mEq/L (ref 19–32)
Calcium: 7.9 mg/dL — ABNORMAL LOW (ref 8.4–10.5)
Chloride: 102 mEq/L (ref 96–112)
Creatinine, Ser: 1.09 mg/dL (ref 0.50–1.10)
GFR calc Af Amer: 70 mL/min — ABNORMAL LOW (ref >90)
GFR calc non Af Amer: 61 mL/min — ABNORMAL LOW (ref >90)
Glucose, Bld: 113 mg/dL — ABNORMAL HIGH (ref 70–99)
Potassium: 4.4 mEq/L (ref 3.5–5.1)
Sodium: 135 mEq/L (ref 135–145)

## 2011-12-27 LAB — GLUCOSE, CAPILLARY
Glucose-Capillary: 111 mg/dL — ABNORMAL HIGH (ref 70–99)
Glucose-Capillary: 122 mg/dL — ABNORMAL HIGH (ref 70–99)
Glucose-Capillary: 147 mg/dL — ABNORMAL HIGH (ref 70–99)
Glucose-Capillary: 149 mg/dL — ABNORMAL HIGH (ref 70–99)
Glucose-Capillary: 154 mg/dL — ABNORMAL HIGH (ref 70–99)
Glucose-Capillary: 162 mg/dL — ABNORMAL HIGH (ref 70–99)

## 2011-12-27 NOTE — Progress Notes (Addendum)
Subjective: 1 Day Post-Op Procedure(s) (LRB): IRRIGATION AND DEBRIDEMENT EXTREMITY (Left) Patient reports pain as mild.  Feeling well.  A little nausea.  No complaints.  Objective: Vital signs in last 24 hours: Temp:  [98.1 F (36.7 C)-98.7 F (37.1 C)] 98.4 F (36.9 C) (01/21 0629) Pulse Rate:  [77-85] 77  (01/21 0629) Resp:  [18] 18  (01/21 0629) BP: (117-125)/(64-80) 117/70 mmHg (01/21 0629) SpO2:  [91 %-96 %] 96 % (01/21 0629)  Intake/Output from previous day: 01/20 0701 - 01/21 0700 In: 1040 [P.O.:240; I.V.:800] Out: 50 [Blood:50] Intake/Output this shift:     Basename 12/27/11 0920 12/26/11 0620 12/25/11 0917  HGB 7.9* 8.0* 8.9*    Basename 12/27/11 0920 12/26/11 0620  WBC 9.0 12.2*  RBC 3.33* 3.29*  HCT 25.9* 25.6*  PLT 402* 411*    Basename 12/26/11 1132 12/25/11 0917  NA 135 133*  K 4.7 4.4  CL 102 97  CO2 26 26  BUN 18 23  CREATININE 1.19* 1.65*  GLUCOSE 173* 154*  CALCIUM 7.9* 8.0*   No results found for this basename: LABPT:2,INR:2 in the last 72 hours  drsg c/d/i.  no erythema.  good sensation and capillary refill in all digits.  Assessment/Plan: 1 Day Post-Op Procedure(s) (LRB): IRRIGATION AND DEBRIDEMENT EXTREMITY (Left) Plan hydrotherapy with packing change today.  Afebrile and wbc normalized.  If hydrotherapy tolerated well, can continue as outpatient.  Appreciate medicine assistance.  F/u two days.  Rane Dumm R 12/27/2011, 11:08 AM  Hydrotherapy to start tomorrow, POD 2.

## 2011-12-27 NOTE — Progress Notes (Signed)
Subjective: Patient feels better, no complaints. No fever or chills.  Patient tolerated surgery well.  Objective: Vital signs in last 24 hours: Filed Vitals:   12/26/11 1027 12/26/11 1430 12/26/11 2100 12/27/11 0629  BP: 129/60 118/64 125/80 117/70  Pulse: 80 85 83 77  Temp: 98.3 F (36.8 C) 98.1 F (36.7 C) 98.7 F (37.1 C) 98.4 F (36.9 C)  TempSrc:  Oral Oral Oral  Resp: 16 18 18 18   Height:      Weight:      SpO2: 97% 91% 95% 96%   Weight change:   Intake/Output Summary (Last 24 hours) at 12/27/11 0845 Last data filed at 12/26/11 1430  Gross per 24 hour  Intake   1040 ml  Output     50 ml  Net    990 ml   Physical Exam: General: resting in bed, not in acute distress HEENT: PERRL, EOMI, no scleral icterus Cardiac: S1/S2, RRR, No murmurs, gallops or rubs Pulm: Good air movement bilaterally, Clear to auscultation bilaterally, No rales, wheezing, rhonchi or rubs. Abd: Soft,  nondistended, nontender, no rebound pain, no organomegaly, BS present Ext: No rashes or edema, 1+DP/PT pulse bilaterally. Right index finger amputation. Left thumb is dressed without draining.   Patient legs have several skin lesions. Left anterior leg has two small skin lesions which have denuded skin and look like infected.   Her right leg has 2 X 2 cm skin lesion on the lateral side of leg, which look like infected. There is pus on the surface of the skin lesion. Patient reports no pain over the lesions.   Neuro: alert and oriented X3, cranial nerves II-XII grossly intact, muscle strength 5/5 in all extremeties,  sensation to light touch intact.      Lab Results: Basic Metabolic Panel:  Lab [id number] 1132 12/25/11 0917  NA 135 133*  K 4.7 4.4  CL 102 97  CO2 26 26  GLUCOSE 173* 154*  BUN 18 23  CREATININE 1.19* 1.65*  CALCIUM 7.9* 8.0*  MG -- --  PHOS -- --     Lab 12/26/11 0620 12/25/11 0917 12/23/11 1134  WBC 12.2* 15.1* --  NEUTROABS -- -- 14.3*  HGB 8.0* 8.9* --  HCT 25.6*  28.5* --  MCV 77.8* 76.8* --  PLT 411* 384 --  CBG:  Lab 12/27/11 0403 12/27/11 0011 12/26/11 2009 12/26/11 1719 12/26/11 1144 12/26/11 0944  GLUCAP 149* 154* 106* 225* 160* 160*   Hemoglobin A1C:  Lab 12/23/11 1930  HGBA1C 12.3*     Micro Results: Recent Results (from the past 240 hour(s))  CULTURE, BLOOD (ROUTINE X 2)     Status: Normal (Preliminary result)   Collection Time   12/23/11 11:05 AM      Component Value Range Status Comment   Specimen Description BLOOD ARM LEFT   Final    Special Requests     Final    Value: BOTTLES DRAWN AEROBIC AND ANAEROBIC 10CC AER 4CC ANA   Setup Time [id number]   Final    Culture     Final    Value:        BLOOD CULTURE RECEIVED NO GROWTH TO DATE CULTURE WILL BE HELD FOR 5 DAYS BEFORE ISSUING A FINAL NEGATIVE REPORT   Report Status PENDING   Incomplete   CULTURE, BLOOD (ROUTINE X 2)     Status: Normal (Preliminary result)   Collection Time   12/23/11 11:10 AM      Component Value Range Status  Comment   Specimen Description BLOOD RIGHT HAND   Final    Special Requests BOTTLES DRAWN AEROBIC ONLY 5CC   Final    Setup Time [id number]   Final    Culture     Final    Value:        BLOOD CULTURE RECEIVED NO GROWTH TO DATE CULTURE WILL BE HELD FOR 5 DAYS BEFORE ISSUING A FINAL NEGATIVE REPORT   Report Status PENDING   Incomplete   ANAEROBIC CULTURE     Status: Normal (Preliminary result)   Collection Time   12/23/11  3:38 PM      Component Value Range Status Comment   Specimen Description ABSCESS THUMB LEFT   Final    Special Requests NONE   Final    Gram Stain     Final    Value: ABUNDANT WBC PRESENT,BOTH PMN AND MONONUCLEAR     ABUNDANT GRAM POSITIVE COCCI     IN PAIRS IN CLUSTERS Performed at [facility] Gram Stain Report Called to,Read Back By and Verified With: Gram Stain Report Called to,Read Back By and Verified With: DR HEWITT [id number] 12/23/11 A BROWNING   Culture PENDING   Incomplete    Report Status PENDING   Incomplete     CULTURE, ROUTINE-ABSCESS     Status: Normal (Preliminary result)   Collection Time   12/23/11  3:38 PM      Component Value Range Status Comment   Specimen Description ABSCESS THUMB LEFT   Final    Special Requests NONE   Final    Gram Stain     Final    Value: ABUNDANT WBC PRESENT,BOTH PMN AND MONONUCLEAR     ABUNDANT GRAM POSITIVE COCCI     IN PAIRS IN CLUSTERS Performed at [facility] Gram Stain Report Called to,Read Back By and Verified With: Gram Stain Report Called to,Read Back By and Verified With: DR HEWITT [id number] 12/23/11 A BROWNING   Culture NO GROWTH   Final    Report Status PENDING   Incomplete   GRAM STAIN     Status: Normal   Collection Time   12/23/11  3:38 PM      Component Value Range Status Comment   Specimen Description ABSCESS THUMB LEFT   Final    Special Requests NONE   Final    Gram Stain     Final    Value: ABUNDANT WBC PRESENT,BOTH PMN AND MONONUCLEAR     ABUNDANT GRAM POSITIVE COCCI IN CLUSTERS IN PAIRS     Gram Stain Report Called to,Read Back By and Verified With: Dr Doran Durand 1617 12/23/11 A Browning   Report Status 12/23/2011 FINAL   Final   ANAEROBIC CULTURE     Status: Normal (Preliminary result)   Collection Time   12/23/11  3:46 PM      Component Value Range Status Comment   Specimen Description ABSCESS THUMB LEFT   Final    Special Requests NONE   Final    Gram Stain     Final    Value: NO WBC SEEN     ABUNDANT SQUAMOUS EPITHELIAL CELLS PRESENT     ABUNDANT GRAM POSITIVE COCCI     IN PAIRS ABUNDANT GRAM POSITIVE RODS     ABUNDANT GRAM NEGATIVE RODS   Culture PENDING   Incomplete    Report Status PENDING   Incomplete   CULTURE, ROUTINE-ABSCESS     Status: Normal (Preliminary result)   Collection Time   12/23/11  3:46 PM      Component Value Range Status Comment   Specimen Description ABSCESS THUMB LEFT   Final    Special Requests NONE   Final    Gram Stain     Final    Value: NO WBC SEEN     ABUNDANT SQUAMOUS EPITHELIAL CELLS PRESENT      ABUNDANT GRAM POSITIVE COCCI     IN PAIRS ABUNDANT GRAM POSITIVE RODS     FEW GRAM NEGATIVE RODS   Culture NO GROWTH   Final    Report Status PENDING   Incomplete    Studies/Results: Dg Hand Complete Left  12/23/2011  *RADIOLOGY REPORT*  Clinical Data: Pain and swelling; diabetes  LEFT HAND - COMPLETE 3+ VIEW  Comparison: August 18, 2011  Findings: There is a large amount of gas within the soft tissues of the left thumb.  There is no evidence of osseous erosion or periosteal reaction to suggest osteomyelitis at this time.  There has been amputation of the third distal phalanx.  IMPRESSION: Marked  subcutaneous emphysema involving the left thumb which is consistent with cellulitis from a gas-forming organism.  Original Report Authenticated By: Duayne Cal, M.D.   Medications:  Scheduled Meds:    . amLODipine  10 mg Oral Daily  . collagenase   Topical Daily  . ferrous sulfate  325 mg Oral BID WC  . folic acid  1 mg Oral Daily  . insulin aspart  0-15 Units Subcutaneous Q4H  . insulin glargine  25 Units Subcutaneous QHS  . living well with diabetes book   Does not apply Once  . mulitivitamin with minerals  1 tablet Oral Daily  . piperacillin-tazobactam (ZOSYN)  IV  3.375 g Intravenous Q8H  . thiamine  100 mg Oral Daily   Or  . thiamine  100 mg Intravenous Daily  . vancomycin  1,000 mg Intravenous Q24H   Continuous Infusions:  PRN Meds:.diphenhydrAMINE, HYDROmorphone, LORazepam, LORazepam, methocarbamol(ROBAXIN) IV, methocarbamol, metoCLOPramide (REGLAN) injection, metoCLOPramide, ondansetron (ZOFRAN) IV, ondansetron, oxyCODONE-acetaminophen, DISCONTD: HYDROmorphone, DISCONTD: HYDROmorphone, DISCONTD: meperidine, DISCONTD: meperidine, DISCONTD: morphine, DISCONTD: ondansetron (ZOFRAN) IV, DISCONTD: promethazine, DISCONTD: sodium chloride irrigation Assessment/Plan:   # Necrotizing fasciitis: patient's swelling thumb with redness and tenderness is most likely casued by necrotizing  fasciitis.  X-ray showed that there is no evidence of ossteous erosion or periosteal reaction to suggest osteomyelitis at this time. But with marked  subcutaneous emphysema involving the left thumb which is consistent with cellulitis from a gas-forming organism.  Leukocytosis getting better.  -Consulted orthopedic surgeon. Appreciate  Orthopedic's help in managing our patient. Dr Fredna Dow saw patient and did IRRIGATION AND DEBRIDEMENT along with partial amputation on 1/18. Repeat I&D on 12/26/11  -continue IV zosyn and Vancomycin, pending culture and sensitivity -pain control with Dilaudid   # DM-II:  Poorly controlled. A1c is 12.3 on this admission. Patient supposes to use Novolog 18 U twice daily at home. But she only used once daily at the best.  -CBG's getting better. -change Lantus to  25 U daily and SSI-moderate sensitivity.    # HTN: Bp well-controlled. Will continue her home dose amlodipine 10 mg daily.  # Alcohol abuse: Drinks 80 ounce of beer/day, last drinking was day prior to admission. Currently no withdraw symptoms.  -will continue CIWA protocol -will continue VB1 and folate   #: skin lesions:  wound care consult.   # DVT PPX: SCD       LOS: 4 days   Ivor Costa 12/27/2011, 8:45  AM    

## 2011-12-27 NOTE — Progress Notes (Addendum)
Internal Medicine Teaching Service Attending Note Date: 12/27/2011  Patient name: [name]  Medical record number: [id number]  Date of birth: 1966/09/30    This patient has been seen and discussed with the house staff. Please see their note for complete details. I concur with their findings with the following additions/corrections:DM controlled and Dr. Fredna Dow is pleased with progress.Will recommend oral antibiotic when ready for discharge.   Lars Mage 12/27/2011, 4:10 PM    Review of cultures of thumb shows no S aureus/MRSA only Step viridans. Can D/c vancomycin. Lars Mage

## 2011-12-27 NOTE — Progress Notes (Signed)
12/27/11  10:20 Hydrotherapy Note: Noted new hydrotherapy order to be started 12/28/11 and be performed every other day.  Spoke with RN, who is aware.  Will follow-up tomorrow.  12/27/2011 Cyndia Bent, PT, DPT [phone]

## 2011-12-28 ENCOUNTER — Encounter (HOSPITAL_COMMUNITY): Payer: Self-pay | Admitting: Orthopedic Surgery

## 2011-12-28 DIAGNOSIS — M869 Osteomyelitis, unspecified: Secondary | ICD-10-CM

## 2011-12-28 LAB — GLUCOSE, CAPILLARY
Glucose-Capillary: 117 mg/dL — ABNORMAL HIGH (ref 70–99)
Glucose-Capillary: 147 mg/dL — ABNORMAL HIGH (ref 70–99)
Glucose-Capillary: 176 mg/dL — ABNORMAL HIGH (ref 70–99)
Glucose-Capillary: 179 mg/dL — ABNORMAL HIGH (ref 70–99)
Glucose-Capillary: 191 mg/dL — ABNORMAL HIGH (ref 70–99)
Glucose-Capillary: 270 mg/dL — ABNORMAL HIGH (ref 70–99)
Glucose-Capillary: 97 mg/dL (ref 70–99)

## 2011-12-28 MED ORDER — MOXIFLOXACIN HCL 400 MG PO TABS
400.0000 mg | ORAL_TABLET | Freq: Every day | ORAL | Status: DC
  Administered 2011-12-28 – 2011-12-29 (×2): 400 mg via ORAL
  Filled 2011-12-28 (×2): qty 1

## 2011-12-28 NOTE — Progress Notes (Signed)
Internal Medicine Teaching Service Attending Note Date: 12/28/2011  Patient name: [name]  Medical record number: [id number]  Date of birth: Oct 11, 1966    This patient has been seen and discussed with the house staff. Please see their note for complete details. I concur with their findings with the following additions/corrections:now on moxifloxacin to complete po antibiotic rx when ready for d/c. Probably near end of this week.  Lars Mage 12/28/2011, 1:02 PM

## 2011-12-28 NOTE — Progress Notes (Signed)
Physical Therapy Wound Treatment Patient Details  Name: [name] MRN: [id number] Date of Birth: Sep 29, 1966  Today's Date: 12/28/2011 Time:0900  - [id number]    Subjective  Subjective: "Is this going to hurt?" pt asked about whirlpool. Patient and Family Stated Goals: Heal thumb. Date of Onset: 12/16/11 Prior Treatments: Amputation of left thumb and repeat I&D.  Pain Score: Pain Score:   5  Wound Assessment  Wound 12/28/11 Other (Comment) Hand Left Open incision lt. palm at base of thumb (Active)  Site / Wound Assessment Pink;Red 12/28/2011  9:40 AM  Peri-wound Assessment Intact 12/28/2011  9:40 AM  Wound Length (cm) 1 cm 12/28/2011  9:40 AM  Wound Width (cm) 5.2 cm 12/28/2011  9:40 AM  Wound Depth (cm) 1.3 cm 12/28/2011  9:40 AM  Undermining (cm) 1 cm from 5 to 7 o'clock 12/28/2011  9:40 AM  Drainage Amount Moderate 12/28/2011  9:40 AM  Drainage Description Serosanguineous 12/28/2011  9:40 AM  Treatment Hydrotherapy (Pulse lavage);Packing (Plain strip) 12/28/2011  9:40 AM  Dressing Type Moist to dry;Gauze (Comment);Compression wrap 12/28/2011  9:40 AM  Dressing Changed Changed 12/28/2011  9:40 AM     Wound 12/28/11 Other (Comment) Hand Left Open incision lt lateral hand at base of thumb (Active)  Site / Wound Assessment Pink;Yellow 12/28/2011  9:40 AM  Peri-wound Assessment Intact 12/28/2011  9:40 AM  Wound Length (cm) 2.7 cm 12/28/2011  9:40 AM  Wound Width (cm) 1 cm 12/28/2011  9:40 AM  Wound Depth (cm) 1 cm 12/28/2011  9:40 AM  Drainage Amount Moderate 12/28/2011  9:40 AM  Drainage Description Serosanguineous 12/28/2011  9:40 AM  Treatment Hydrotherapy (Pulse lavage);Packing (Plain strip) 12/28/2011  9:40 AM  Dressing Type Moist to dry;Gauze (Comment);Compression wrap 12/28/2011  9:40 AM  Dressing Changed Changed 12/28/2011  9:40 AM        Whirlpool x 10 minutes to lt hand.  Wound Assessment and Plan  Wound Therapy - Assess/Plan/Recommendations Wound Therapy - Clinical Statement: Pt with  open wounds after I&D that look clean. Wound Therapy - Functional Problem List: Decr use of lt hand Factors Delaying/Impairing Wound Healing: Diabetes Mellitus;Multiple medical problems;Polypharmacy Hydrotherapy Plan: Debridement;Dressing change;Patient/family education;Whirlpool Wound Therapy - Frequency: Other (comment) (every other day) Wound Therapy - Follow Up Recommendations: Home health RN  Wound Therapy Goals- Improve the function of patient's integumentary system by progressing the wound(s) through the phases of wound healing (inflammation - proliferation - remodeling) by: Increase Granulation Tissue to: 100% Goals/treatment plan/discharge plan were made with and agreed upon by patient/family: Yes Time For Goal Achievement: 7 days Wound Therapy - Potential for Goals: Good  Goals will be updated until maximal potential achieved or discharge criteria met.  Discharge criteria: when goals achieved, discharge from hospital, MD decision/surgical intervention, no progress towards goals, refusal/missing three consecutive treatments without notification or medical reason.  Daryle Boyington 12/28/2011, 10:33 AM  Suanne Marker PT [phone]

## 2011-12-28 NOTE — Progress Notes (Signed)
Subjective: 2 Days Post-Op Procedure(s) (LRB): IRRIGATION AND DEBRIDEMENT EXTREMITY (Left) Patient reports pain as mild.    Objective: Vital signs in last 24 hours: Temp:  [98.1 F (36.7 C)-98.3 F (36.8 C)] 98.2 F (36.8 C) (01/22 1400) Pulse Rate:  [85-87] 85  (01/22 1400) Resp:  [18-19] 19  (01/22 1400) BP: (128-157)/(72-89) 157/83 mmHg (01/22 1400) SpO2:  [96 %-100 %] 100 % (01/22 1400)  Intake/Output from previous day: 01/21 0701 - 01/22 0700 In: 444 [P.O.:240; IV Piggyback:204] Out: -  Intake/Output this shift: Total I/O In: 100 [IV Piggyback:100] Out: -    Basename 12/27/11 0920 12/26/11 0620  HGB 7.9* 8.0*    Basename 12/27/11 0920 12/26/11 0620  WBC 9.0 12.2*  RBC 3.33* 3.29*  HCT 25.9* 25.6*  PLT 402* 411*    Basename 12/27/11 0920 12/26/11 1132  NA 135 135  K 4.4 4.7  CL 102 102  CO2 24 26  BUN 15 18  CREATININE 1.09 1.19*  GLUCOSE 113* 173*  CALCIUM 7.9* 7.9*   No results found for this basename: LABPT:2,INR:2 in the last 72 hours  dressing c/d/i.  no erythema proximal to dressing.  wiggles fingers.  Assessment/Plan: 2 Days Post-Op Procedure(s) (LRB): IRRIGATION AND DEBRIDEMENT EXTREMITY (Left) Tolerated hydrotherapy well.  Feels well overall.  Pain controlled.  Okay for d/c from ortho standpoint.  Follow up in office Thursday or Friday for hydrotherapy.  Elanie Hammitt R 12/28/2011, 4:16 PM

## 2011-12-28 NOTE — Progress Notes (Signed)
Subjective: Patient feels better, no complaints. No fever or chills.    Objective: Vital signs in last 24 hours: Filed Vitals:   12/27/11 1453 12/27/11 1744 12/27/11 2131 12/28/11 0524  BP: 142/83 138/81 150/72 128/89  Pulse: 91 86 87 85  Temp: 98.3 F (36.8 C) 98.1 F (36.7 C) 98.2 F (36.8 C) 98.3 F (36.8 C)  TempSrc: Oral Oral Oral Oral  Resp: 18 19 18 18   Height:      Weight:      SpO2: 96% 97% 96% 98%   Weight change:   Intake/Output Summary (Last 24 hours) at 12/28/11 1251 Last data filed at 12/28/11 0800  Gross per 24 hour  Intake    544 ml  Output      0 ml  Net    544 ml   Physical Exam: General: resting in bed, not in acute distress HEENT: PERRL, EOMI, no scleral icterus Cardiac: S1/S2, RRR, No murmurs, gallops or rubs Pulm: Good air movement bilaterally, Clear to auscultation bilaterally, No rales, wheezing, rhonchi or rubs. Abd: Soft,  nondistended, nontender, no rebound pain, no organomegaly, BS present Ext: No rashes or edema, 1+DP/PT pulse bilaterally. Right index finger amputation. Left thumb is dressed without draining.   Patient legs have several skin lesions. Left anterior leg has two small skin lesions which have denuded skin and look like infected.   Her right leg has 2 X 2 cm skin lesion on the lateral side of leg, which look like infected. There is pus on the surface of the skin lesion. Patient reports no pain over the lesions.   Neuro: alert and oriented X3, cranial nerves II-XII grossly intact, muscle strength 5/5 in all extremeties,  sensation to light touch intact.      Lab Results: Basic Metabolic Panel:  Lab [id number] 1132 12/25/11 0917  NA 135 133*  K 4.7 4.4  CL 102 97  CO2 26 26  GLUCOSE 173* 154*  BUN 18 23  CREATININE 1.19* 1.65*  CALCIUM 7.9* 8.0*  MG -- --  PHOS -- --     Lab 12/27/11 0920 12/26/11 0620 12/23/11 1134  WBC 9.0 12.2* --  NEUTROABS -- -- 14.3*  HGB 7.9* 8.0* --  HCT 25.9* 25.6* --  MCV 77.8* 77.8* --    PLT 402* 411* --  CBG:  Lab 12/28/11 1154 12/28/11 0751 12/28/11 0406 12/27/11 2358 12/27/11 2008 12/27/11 1633  GLUCAP 147* 117* 179* 97 162* 147*   Hemoglobin A1C:  Lab 12/23/11 1930  HGBA1C 12.3*     Micro Results: Recent Results (from the past 240 hour(s))  CULTURE, BLOOD (ROUTINE X 2)     Status: Normal (Preliminary result)   Collection Time   12/23/11 11:05 AM      Component Value Range Status Comment   Specimen Description BLOOD ARM LEFT   Final    Special Requests     Final    Value: BOTTLES DRAWN AEROBIC AND ANAEROBIC 10CC AER 4CC ANA   Setup Time [id number]   Final    Culture     Final    Value:        BLOOD CULTURE RECEIVED NO GROWTH TO DATE CULTURE WILL BE HELD FOR 5 DAYS BEFORE ISSUING A FINAL NEGATIVE REPORT   Report Status PENDING   Incomplete   CULTURE, BLOOD (ROUTINE X 2)     Status: Normal (Preliminary result)   Collection Time   12/23/11 11:10 AM      Component Value Range Status  Comment   Specimen Description BLOOD RIGHT HAND   Final    Special Requests BOTTLES DRAWN AEROBIC ONLY 5CC   Final    Setup Time [id number]   Final    Culture     Final    Value:        BLOOD CULTURE RECEIVED NO GROWTH TO DATE CULTURE WILL BE HELD FOR 5 DAYS BEFORE ISSUING A FINAL NEGATIVE REPORT   Report Status PENDING   Incomplete   ANAEROBIC CULTURE     Status: Normal (Preliminary result)   Collection Time   12/23/11  3:38 PM      Component Value Range Status Comment   Specimen Description ABSCESS THUMB LEFT   Final    Special Requests NONE   Final    Gram Stain     Final    Value: ABUNDANT WBC PRESENT,BOTH PMN AND MONONUCLEAR     ABUNDANT GRAM POSITIVE COCCI     IN PAIRS IN CLUSTERS Performed at [facility] Gram Stain Report Called to,Read Back By and Verified With: Gram Stain Report Called to,Read Back By and Verified With: DR HEWITT [id number] 12/23/11 A BROWNING   Culture PENDING   Incomplete    Report Status PENDING   Incomplete   CULTURE, ROUTINE-ABSCESS      Status: Normal (Preliminary result)   Collection Time   12/23/11  3:38 PM      Component Value Range Status Comment   Specimen Description ABSCESS THUMB LEFT   Final    Special Requests NONE   Final    Gram Stain     Final    Value: ABUNDANT WBC PRESENT,BOTH PMN AND MONONUCLEAR     ABUNDANT GRAM POSITIVE COCCI     IN PAIRS IN CLUSTERS Performed at [facility] Gram Stain Report Called to,Read Back By and Verified With: Gram Stain Report Called to,Read Back By and Verified With: DR HEWITT [id number] 12/23/11 A BROWNING   Culture NO GROWTH   Final    Report Status PENDING   Incomplete   GRAM STAIN     Status: Normal   Collection Time   12/23/11  3:38 PM      Component Value Range Status Comment   Specimen Description ABSCESS THUMB LEFT   Final    Special Requests NONE   Final    Gram Stain     Final    Value: ABUNDANT WBC PRESENT,BOTH PMN AND MONONUCLEAR     ABUNDANT GRAM POSITIVE COCCI IN CLUSTERS IN PAIRS     Gram Stain Report Called to,Read Back By and Verified With: Dr Doran Durand 1617 12/23/11 A Browning   Report Status 12/23/2011 FINAL   Final   ANAEROBIC CULTURE     Status: Normal (Preliminary result)   Collection Time   12/23/11  3:46 PM      Component Value Range Status Comment   Specimen Description ABSCESS THUMB LEFT   Final    Special Requests NONE   Final    Gram Stain     Final    Value: NO WBC SEEN     ABUNDANT SQUAMOUS EPITHELIAL CELLS PRESENT     ABUNDANT GRAM POSITIVE COCCI     IN PAIRS ABUNDANT GRAM POSITIVE RODS     ABUNDANT GRAM NEGATIVE RODS   Culture PENDING   Incomplete    Report Status PENDING   Incomplete   CULTURE, ROUTINE-ABSCESS     Status: Normal (Preliminary result)   Collection Time   12/23/11  3:46 PM      Component Value Range Status Comment   Specimen Description ABSCESS THUMB LEFT   Final    Special Requests NONE   Final    Gram Stain     Final    Value: NO WBC SEEN     ABUNDANT SQUAMOUS EPITHELIAL CELLS PRESENT     ABUNDANT GRAM POSITIVE COCCI      IN PAIRS ABUNDANT GRAM POSITIVE RODS     FEW GRAM NEGATIVE RODS   Culture NO GROWTH   Final    Report Status PENDING   Incomplete    Studies/Results: Dg Hand Complete Left  12/23/2011  *RADIOLOGY REPORT*  Clinical Data: Pain and swelling; diabetes  LEFT HAND - COMPLETE 3+ VIEW  Comparison: August 18, 2011  Findings: There is a large amount of gas within the soft tissues of the left thumb.  There is no evidence of osseous erosion or periosteal reaction to suggest osteomyelitis at this time.  There has been amputation of the third distal phalanx.  IMPRESSION: Marked  subcutaneous emphysema involving the left thumb which is consistent with cellulitis from a gas-forming organism.  Original Report Authenticated By: Duayne Cal, M.D.   Medications:  Scheduled Meds:    . amLODipine  10 mg Oral Daily  . collagenase   Topical Daily  . ferrous sulfate  325 mg Oral BID WC  . folic acid  1 mg Oral Daily  . insulin aspart  0-15 Units Subcutaneous Q4H  . insulin glargine  25 Units Subcutaneous QHS  . mulitivitamin with minerals  1 tablet Oral Daily  . piperacillin-tazobactam (ZOSYN)  IV  3.375 g Intravenous Q8H  . thiamine  100 mg Oral Daily   Or  . thiamine  100 mg Intravenous Daily  . vancomycin  1,000 mg Intravenous Q24H   Continuous Infusions:  PRN Meds:.diphenhydrAMINE, HYDROmorphone, methocarbamol(ROBAXIN) IV, methocarbamol, metoCLOPramide (REGLAN) injection, metoCLOPramide, ondansetron (ZOFRAN) IV, ondansetron, oxyCODONE-acetaminophen Assessment/Plan:   # Necrotizing fasciitis: patient's swelling thumb with redness and tenderness is most likely casued by necrotizing fasciitis.  X-ray showed that there is no evidence of ossteous erosion or periosteal reaction to suggest osteomyelitis at this time. But with marked  subcutaneous emphysema involving the left thumb which is consistent with cellulitis from a gas-forming organism.  Leukocytosis getting better.  -Consulted orthopedic  surgeon. Appreciate  Orthopedic's help in managing our patient. Dr Fredna Dow saw patient and did IRRIGATION AND DEBRIDEMENT along with partial amputation on 1/18. Repeat I&D on 12/26/11. Will repeat I and on 1/22.  -will change IV zosyn and Vancomycin to oral moxifloxaxin -pain control with Dilaudid   # DM-II:  Poorly controlled. A1c is 12.3 on this admission. Patient supposes to use Novolog 18 U twice daily at home. But she only used once daily at the best.  -CBG's getting better. -change Lantus to  25 U daily and SSI-moderate sensitivity.    # HTN: Bp well-controlled. Will continue her home dose amlodipine 10 mg daily.  # Alcohol abuse: Drinks 80 ounce of beer/day, last drinking was day prior to admission. Currently no withdraw symptoms.  -will continue CIWA protocol -will continue VB1 and folate  #: skin lesions:  wound care consult.   # DVT PPX: SCD       LOS: 5 days   Ivor Costa 12/28/2011, 12:51 PM

## 2011-12-29 LAB — GLUCOSE, CAPILLARY
Glucose-Capillary: 230 mg/dL — ABNORMAL HIGH (ref 70–99)
Glucose-Capillary: 247 mg/dL — ABNORMAL HIGH (ref 70–99)
Glucose-Capillary: 242 mg/dL — ABNORMAL HIGH (ref 70–99)

## 2011-12-29 MED ORDER — INSULIN NPH ISOPHANE & REGULAR (70-30) 100 UNIT/ML ~~LOC~~ SUSP: SUBCUTANEOUS | Status: DC

## 2011-12-29 MED ORDER — INSULIN PEN NEEDLE 31G X 5 MM MISC: Status: DC

## 2011-12-29 MED ORDER — METHOCARBAMOL 500 MG PO TABS: 500.0000 mg | ORAL_TABLET | Freq: Four times a day (QID) | ORAL | Status: AC | PRN

## 2011-12-29 MED ORDER — OXYCODONE-ACETAMINOPHEN 5-325 MG PO TABS: 1.0000 | ORAL_TABLET | ORAL | Status: AC | PRN

## 2011-12-29 MED ORDER — METFORMIN HCL 1000 MG PO TABS: 1000.0000 mg | ORAL_TABLET | Freq: Two times a day (BID) | ORAL | Status: DC

## 2011-12-29 MED ORDER — THIAMINE HCL 100 MG PO TABS: 100.0000 mg | ORAL_TABLET | Freq: Every day | ORAL | Status: DC

## 2011-12-29 MED ORDER — DOXYCYCLINE HYCLATE 100 MG PO TABS: 100.0000 mg | ORAL_TABLET | Freq: Two times a day (BID) | ORAL | Status: AC

## 2011-12-29 NOTE — Progress Notes (Signed)
1615, 12/29/11, prescriptions given to patient by doctor. Discharge instructions gone over with patient and spouse. Follow up appointments already in place. Home medications gone over with patient. S/s of infections and hypoglycemia gone over, also any worsening condition. Patient and spouse verbalized understanding of all.

## 2011-12-29 NOTE — Discharge Summary (Signed)
Patient Name:  [name]  MRN: [id number]  PCP: No primary provider on file.  DOB:  09-19-1966       Date of Admission:  12/23/2011  Date of Discharge:  12/29/2011      Attending Physician: Dr. Orene Desanctis       DISCHARGE DIAGNOSES: Principal Problem:       Necrotizing fasciitis Active Problems:       Diabetes mellitus type 2, uncontrolled       Hypertension       Alcohol abuse   DISPOSITION AND FOLLOW-UP: Alysabeth Hirsch is to follow-up with the listed providers as detailed below. In her visit, please check her glucose level to assure her DM-II is controlled well.   Follow-up Information    Follow up with Ivor Costa, MD on 01/10/2012. (at 9:45 AM)    Contact information:   [address] [place] [phone]       Follow up with Tennis Must, MD on 12/31/2011. (at 10:40 AM)    Contact information:   [address] [place] [phone]         Discharge Orders    Future Appointments: Provider: Department: Dept Phone: Center:   01/10/2012 9:45 AM Ivor Costa, MD [facility] [phone] [facility]     Future Orders Please Complete By Expires   Diet Carb Modified      Increase activity slowly      Call MD for:  temperature >100.4      Call MD for:  severe uncontrolled pain      Call MD for:  difficulty breathing, headache or visual disturbances      Call MD for:  persistant dizziness or light-headedness      Call MD for:  extreme fatigue          DISCHARGE MEDICATIONS: Medication List  As of 12/29/2011  5:45 PM   STOP taking these medications         insulin aspart 100 UNIT/ML injection         TAKE these medications         amLODipine 10 MG tablet   Commonly known as: NORVASC   Take 10 mg by mouth daily.      doxycycline 100 MG tablet   Commonly known as: VIBRA-TABS   Take 1 tablet (100 mg total) by mouth 2 (two) times daily.      ferrous sulfate 325 (65 FE) MG tablet   Take 325 mg by mouth 2  (two) times daily with a meal.      folic acid 1 MG tablet   Commonly known as: FOLVITE   Take 1 mg by mouth daily.      insulin NPH-insulin regular (70-30) 100 UNIT/ML injection   Commonly known as: NOVOLIN 70/30   Please inject 15 units twice daily, one is before breakfast and another is before dinner.      Insulin Pen Needle 31G X 5 MM Misc   Use this needle for insulin injection      metFORMIN 1000 MG tablet   Commonly known as: GLUCOPHAGE   Take 1 tablet (1,000 mg total) by mouth 2 (two) times daily with a meal.      methocarbamol 500 MG tablet   Commonly known as: ROBAXIN   Take 1 tablet (500 mg total) by mouth every 6 (six) hours as needed.      oxyCODONE-acetaminophen 5-325 MG per tablet  Commonly known as: PERCOCET   Take 1-2 tablets by mouth every 4 (four) hours as needed.      thiamine 100 MG tablet   Take 1 tablet (100 mg total) by mouth daily.             CONSULTS: orthopedic surgery was consulted.  PROCEDURES PERFORMED:  Dg Hand Complete Left  12/23/2011  *RADIOLOGY REPORT*  Clinical Data: Pain and swelling; diabetes  LEFT HAND - COMPLETE 3+ VIEW  Comparison: August 18, 2011  Findings: There is a large amount of gas within the soft tissues of the left thumb.  There is no evidence of osseous erosion or periosteal reaction to suggest osteomyelitis at this time.  There has been amputation of the third distal phalanx.    IMPRESSION: Marked  subcutaneous emphysema involving the left thumb which is consistent with cellulitis from a gas-forming organism.  Original Report Authenticated By: Duayne Cal, M.D.     ADMISSION DATA: H&P: Patient is 46 yo lady with PMH of DM-II (A1c 13.9 on 08/19/11), HTN and alcohol abuse, who presents with left thumb pain, swelling for a week.   Per patient, her left thumb began to swell and become red 1 week ago, with mild pain. She has fevers (did not measure body temperature) and chills at night prior to admission. Did not seek  for treatment. She tried tylenol which relived her pain partially, without any help for swelling. The problem is progressively getting worse. No previous problems in left thumb. Does not remember any wounds or injury to left thumb.  Of note, patient has had previous right index finger amputations due to infection 5 months ago. When I saw patient, she already had I & D of left thumb by Dr. Leanora Cover. Her left thumb was dressed. Patient did not pain after surgery.  Denies headaches,  cough, chest pain, SOB,  abdominal pain,diarrhea, constipation, dysuria, urgency, frequency, hematuria, joint pain.  Physical Exam: Vitals: T: 97.9     HR: 93      BP:  138/85     RR: 20       O2 saturation:  100% at RA.  BW 179 LB,  last menstrual period 11/22/2011,  General: resting in bed, not in acute distress HEENT: PERRL, EOMI, no scleral icterus Cardiac: S1/S2, RRR, No murmurs, gallops or rubs Pulm: Good air movement bilaterally, Clear to auscultation bilaterally, No rales, wheezing, rhonchi or rubs. Abd: Soft,  nondistended, nontender, no rebound pain, no organomegaly, BS present Ext: No rashes or edema, 1+DP/PT pulse bilaterally. Right index finger amputation. Left thumb is dressed without draining.   Patient legs have several skin lesions. Left anterior leg has two small skin lesions which have denuded skin and look like infected.   Her right leg has 2 X 2 cm skin lesion on the lateral side of leg, which look like infected. There is pus on the surface of the skin lesion. Patient reports no pain over the lesions.  Neuro: alert and oriented X3, cranial nerves II-XII grossly intact, muscle strength 5/5 in all extremeties,  sensation to light touch intact.    Lab results: Basic Metabolic Panel:  Basename  12/23/11 1134   NA  133*   K  5.3*   CL  96   CO2  23   GLUCOSE  363*   BUN  14   CREATININE  0.79   CALCIUM  9.4   MG  --   PHOS  --    CBC:  Basename  12/23/11 1134   WBC  16.8*     NEUTROABS  14.3*   HGB  11.0*   HCT  34.6*   MCV  75.9*   PLT  359    CBG:  Basename  12/23/11 1759  12/23/11 1642  12/23/11 1444   GLUCAP  222*  209*  285*     HOSPITAL COURSE:  # Necrotizing fasciitis: patient's swelling thumb with redness and tenderness is most likely caused by necrotizing fasciitis.  X-ray showed that there is no evidence of ossteous erosion or periosteal reaction to suggest osteomyelitis at this time. But with marked  subcutaneous emphysema involving the left thumb which is consistent with cellulitis from a gas-forming organism. Consulted orthopedic surgeon.  Dr Fredna Dow saw patient and did I&D along with partial amputation on 1/18. Repeat I&D on 12/26/11. Patient was also treated with IV zosyn and Vancomycin initially. Gram stain showed GRAM POSITIVE COCCI IN PAIRS, ABUNDANT GRAM POSITIVE RODS and  FEW GRAM NEGATIVE RODS. And culture showed ABUNDANT PEPTOSTREPTOCOCCUS SPECIES. We switched iv  to oral antibiotics. Patient responded to the treatment well. At discharge, her pain is minimal. No fever or chills. No leukocytosis. Patient is discharged home on oral doxycycline for 4 weeks. She is to follow up with Dr. Fredna Dow at 12/31/11.  # DM-II:  Poorly controlled. A1c is 12.3 on this admission. Patient supposed to use Novolog 18 U twice daily at home. But she only used once daily at the best. She was treated with Lantus 25 U daily and SSI-moderate sensitivity in the hospital. She was discharged on metformin 1000 mg bid and 70/30 mix insulin 15 U bid before breakfast and dinner. We did not continue Lantus because patient can not afford for it due to lack of insurance. She is to follow up at clinic at  01/10/12. Please check her blood glucose level and adjust her insulin dose.  # HTN: Bp well-controlled. Continued her home dose amlodipine 10 mg daily.  # Alcohol abuse: Drinks 80 ounce of beer/day, last drinking was the day prior to admission. No withdraw symptoms in hospital. Patient  was treated with CIWA protocol and VB1 and folate  #: skin lesions:  wound care consulted.  DISCHARGE DATA: Vital Signs: BP 132/70  Pulse 85  Temp(Src) 98.2 F (36.8 C) (Oral)  Resp 18  Ht 5' 6.93" (1.7 m)  Wt 179 lb 14.3 oz (81.6 kg)  BMI 28.24 kg/m2  SpO2 99%  LMP 11/22/2011  Labs: Results for orders placed during the hospital encounter of 12/23/11 (from the past 24 hour(s))  GLUCOSE, CAPILLARY     Status: Abnormal   Collection Time   12/28/11  8:12 PM      Component Value Range   Glucose-Capillary 270 (*) 70 - 99 (mg/dL)  GLUCOSE, CAPILLARY     Status: Abnormal   Collection Time   12/28/11 11:58 PM      Component Value Range   Glucose-Capillary 176 (*) 70 - 99 (mg/dL)   Comment 1 Notify RN     Comment 2 Documented in Chart    GLUCOSE, CAPILLARY     Status: Abnormal   Collection Time   12/29/11  4:21 AM      Component Value Range   Glucose-Capillary 230 (*) 70 - 99 (mg/dL)   Comment 1 Notify RN     Comment 2 Documented in Chart    GLUCOSE, CAPILLARY     Status: Abnormal   Collection Time   12/29/11  7:59  AM      Component Value Range   Glucose-Capillary 247 (*) 70 - 99 (mg/dL)   Comment 1 Notify RN    GLUCOSE, CAPILLARY     Status: Abnormal   Collection Time   12/29/11 12:02 PM      Component Value Range   Glucose-Capillary 242 (*) 70 - 99 (mg/dL)   Comment 1 Notify RN      Time spent on discharging this patient is about 36 min.  Signed: Ivor Costa, MD PGY I, Internal Medicine Resident 12/29/2011, 5:45 PM

## 2011-12-29 NOTE — Progress Notes (Signed)
Inpatient Diabetes Program Recommendations  AACE/ADA: New Consensus Statement on Inpatient Glycemic Control (2009)  Target Ranges:  Prepandial:   less than 140 mg/dL      Peak postprandial:   less than 180 mg/dL (1-2 hours)      Critically ill patients:  140 - 180 mg/dL   CBGs 01/22: 97/ 179/ 117/ 147/ 191/ 270 mg/dl CBGs today: 176/ 230/ 247/ 242 mg/dl  Inpatient Diabetes Program Recommendations Correction (SSI): Please change Novolog SSI to tid ac + HS (currently ordered Q4 hours). Insulin - Meal Coverage: Please add Novolog 4 units tid with meals.  Note: Will follow.

## 2011-12-29 NOTE — Progress Notes (Signed)
Notified by MD that pt will need assistance with meds at time of D/C, however per hospital pharmacy pt is not eligible for assistance with meds as she received meds from hospital pharmacy in September 2012. This CM will search for manufacture programs that pt may qualify for when d/c meds determined. However this will not be helpful with antibiotics as pt would need these upon d/c. MD please carefully consider d/c meds. For cost. If medication is too expensive pt may likely not attempt to fill the prescription at all.

## 2012-01-07 ENCOUNTER — Emergency Department (HOSPITAL_COMMUNITY): Payer: Self-pay

## 2012-01-07 ENCOUNTER — Encounter (HOSPITAL_COMMUNITY): Payer: Self-pay | Admitting: Emergency Medicine

## 2012-01-07 ENCOUNTER — Inpatient Hospital Stay (HOSPITAL_COMMUNITY)
Admission: EM | Admit: 2012-01-07 | Discharge: 2012-01-13 | DRG: 699 | Disposition: A | Discharge status: Home Health | Payer: Self-pay | Attending: Internal Medicine | Admitting: Internal Medicine

## 2012-01-07 DIAGNOSIS — D649 Anemia, unspecified: Secondary | ICD-10-CM

## 2012-01-07 DIAGNOSIS — I10 Essential (primary) hypertension: Secondary | ICD-10-CM | POA: Insufficient documentation

## 2012-01-07 DIAGNOSIS — N058 Unspecified nephritic syndrome with other morphologic changes: Secondary | ICD-10-CM | POA: Diagnosis present

## 2012-01-07 DIAGNOSIS — Z794 Long term (current) use of insulin: Secondary | ICD-10-CM

## 2012-01-07 DIAGNOSIS — S68019A Complete traumatic metacarpophalangeal amputation of unspecified thumb, initial encounter: Secondary | ICD-10-CM

## 2012-01-07 DIAGNOSIS — M869 Osteomyelitis, unspecified: Secondary | ICD-10-CM | POA: Diagnosis not present

## 2012-01-07 DIAGNOSIS — S90829A Blister (nonthermal), unspecified foot, initial encounter: Secondary | ICD-10-CM

## 2012-01-07 DIAGNOSIS — S68118A Complete traumatic metacarpophalangeal amputation of other finger, initial encounter: Secondary | ICD-10-CM

## 2012-01-07 DIAGNOSIS — N179 Acute kidney failure, unspecified: Secondary | ICD-10-CM

## 2012-01-07 DIAGNOSIS — E11319 Type 2 diabetes mellitus with unspecified diabetic retinopathy without macular edema: Secondary | ICD-10-CM | POA: Diagnosis present

## 2012-01-07 DIAGNOSIS — E1129 Type 2 diabetes mellitus with other diabetic kidney complication: Principal | ICD-10-CM | POA: Diagnosis present

## 2012-01-07 DIAGNOSIS — IMO0002 Reserved for concepts with insufficient information to code with codable children: Secondary | ICD-10-CM | POA: Diagnosis not present

## 2012-01-07 DIAGNOSIS — E1169 Type 2 diabetes mellitus with other specified complication: Secondary | ICD-10-CM | POA: Diagnosis not present

## 2012-01-07 DIAGNOSIS — M726 Necrotizing fasciitis: Secondary | ICD-10-CM | POA: Diagnosis present

## 2012-01-07 DIAGNOSIS — S81819A Laceration without foreign body, unspecified lower leg, initial encounter: Secondary | ICD-10-CM

## 2012-01-07 DIAGNOSIS — K5900 Constipation, unspecified: Secondary | ICD-10-CM | POA: Diagnosis present

## 2012-01-07 DIAGNOSIS — I159 Secondary hypertension, unspecified: Secondary | ICD-10-CM | POA: Insufficient documentation

## 2012-01-07 DIAGNOSIS — F1010 Alcohol abuse, uncomplicated: Secondary | ICD-10-CM

## 2012-01-07 DIAGNOSIS — E1149 Type 2 diabetes mellitus with other diabetic neurological complication: Secondary | ICD-10-CM | POA: Diagnosis present

## 2012-01-07 DIAGNOSIS — F1020 Alcohol dependence, uncomplicated: Secondary | ICD-10-CM | POA: Insufficient documentation

## 2012-01-07 DIAGNOSIS — E1142 Type 2 diabetes mellitus with diabetic polyneuropathy: Secondary | ICD-10-CM | POA: Diagnosis present

## 2012-01-07 DIAGNOSIS — E1165 Type 2 diabetes mellitus with hyperglycemia: Principal | ICD-10-CM | POA: Diagnosis present

## 2012-01-07 DIAGNOSIS — E119 Type 2 diabetes mellitus without complications: Secondary | ICD-10-CM | POA: Insufficient documentation

## 2012-01-07 DIAGNOSIS — M9080 Osteopathy in diseases classified elsewhere, unspecified site: Secondary | ICD-10-CM | POA: Diagnosis not present

## 2012-01-07 DIAGNOSIS — E1139 Type 2 diabetes mellitus with other diabetic ophthalmic complication: Secondary | ICD-10-CM | POA: Diagnosis present

## 2012-01-07 DIAGNOSIS — D509 Iron deficiency anemia, unspecified: Secondary | ICD-10-CM | POA: Diagnosis present

## 2012-01-07 DIAGNOSIS — D72829 Elevated white blood cell count, unspecified: Secondary | ICD-10-CM | POA: Diagnosis present

## 2012-01-07 DIAGNOSIS — Z79899 Other long term (current) drug therapy: Secondary | ICD-10-CM

## 2012-01-07 HISTORY — DX: Necrotizing fasciitis: M72.6

## 2012-01-07 LAB — DIFFERENTIAL
Basophils Absolute: 0 10*3/uL (ref 0.0–0.1)
Basophils Relative: 0 % (ref 0–1)
Eosinophils Absolute: 0.2 10*3/uL (ref 0.0–0.7)
Eosinophils Relative: 2 % (ref 0–5)
Lymphocytes Relative: 14 % (ref 12–46)
Lymphs Abs: 1.6 10*3/uL (ref 0.7–4.0)
Monocytes Absolute: 0.5 10*3/uL (ref 0.1–1.0)
Monocytes Relative: 5 % (ref 3–12)
Neutro Abs: 8.8 10*3/uL — ABNORMAL HIGH (ref 1.7–7.7)
Neutrophils Relative %: 79 % — ABNORMAL HIGH (ref 43–77)

## 2012-01-07 LAB — COMPREHENSIVE METABOLIC PANEL
ALT: 6 U/L (ref 0–35)
AST: 10 U/L (ref 0–37)
Albumin: 2.3 g/dL — ABNORMAL LOW (ref 3.5–5.2)
Alkaline Phosphatase: 89 U/L (ref 39–117)
BUN: 12 mg/dL (ref 6–23)
CO2: 24 mEq/L (ref 19–32)
Calcium: 8.7 mg/dL (ref 8.4–10.5)
Chloride: 104 mEq/L (ref 96–112)
Creatinine, Ser: 0.73 mg/dL (ref 0.50–1.10)
GFR calc Af Amer: >90 mL/min (ref >90)
GFR calc non Af Amer: >90 mL/min (ref >90)
Glucose, Bld: 103 mg/dL — ABNORMAL HIGH (ref 70–99)
Potassium: 3.8 mEq/L (ref 3.5–5.1)
Sodium: 140 mEq/L (ref 135–145)
Total Bilirubin: 0.2 mg/dL — ABNORMAL LOW (ref 0.3–1.2)
Total Protein: 7.7 g/dL (ref 6.0–8.3)

## 2012-01-07 LAB — URINALYSIS, ROUTINE W REFLEX MICROSCOPIC
Bilirubin Urine: NEGATIVE
Glucose, UA: NEGATIVE mg/dL
Ketones, ur: NEGATIVE mg/dL
Nitrite: NEGATIVE
Protein, ur: >300 mg/dL — AB
Specific Gravity, Urine: 1.011 (ref 1.005–1.030)
Urobilinogen, UA: 0.2 mg/dL (ref 0.0–1.0)
pH: 6.5 (ref 5.0–8.0)

## 2012-01-07 LAB — CBC
HCT: 26.6 % — ABNORMAL LOW (ref 36.0–46.0)
HCT: 25.2 % — ABNORMAL LOW (ref 36.0–46.0)
Hemoglobin: 8.3 g/dL — ABNORMAL LOW (ref 12.0–15.0)
Hemoglobin: 8 g/dL — ABNORMAL LOW (ref 12.0–15.0)
MCH: 23.4 pg — ABNORMAL LOW (ref 26.0–34.0)
MCH: 23.9 pg — ABNORMAL LOW (ref 26.0–34.0)
MCHC: 31.2 g/dL (ref 30.0–36.0)
MCHC: 31.7 g/dL (ref 30.0–36.0)
MCV: 75.1 fL — ABNORMAL LOW (ref 78.0–100.0)
MCV: 75.2 fL — ABNORMAL LOW (ref 78.0–100.0)
Platelets: 426 10*3/uL — ABNORMAL HIGH (ref 150–400)
Platelets: 410 10*3/uL — ABNORMAL HIGH (ref 150–400)
RBC: 3.54 MIL/uL — ABNORMAL LOW (ref 3.87–5.11)
RBC: 3.35 MIL/uL — ABNORMAL LOW (ref 3.87–5.11)
RDW: 13.3 % (ref 11.5–15.5)
RDW: 13.3 % (ref 11.5–15.5)
WBC: 11.1 10*3/uL — ABNORMAL HIGH (ref 4.0–10.5)
WBC: 11.3 10*3/uL — ABNORMAL HIGH (ref 4.0–10.5)

## 2012-01-07 LAB — GLUCOSE, CAPILLARY
Glucose-Capillary: 103 mg/dL — ABNORMAL HIGH (ref 70–99)
Glucose-Capillary: 260 mg/dL — ABNORMAL HIGH (ref 70–99)

## 2012-01-07 LAB — CREATININE, SERUM
Creatinine, Ser: 0.87 mg/dL (ref 0.50–1.10)
GFR calc Af Amer: >90 mL/min (ref >90)
GFR calc non Af Amer: 80 mL/min — ABNORMAL LOW (ref >90)

## 2012-01-07 LAB — URINE MICROSCOPIC-ADD ON

## 2012-01-07 MED ORDER — SODIUM CHLORIDE 0.9 % IJ SOLN
3.0000 mL | Freq: Two times a day (BID) | INTRAMUSCULAR | Status: DC
  Administered 2012-01-07 – 2012-01-09 (×2): 3 mL via INTRAVENOUS

## 2012-01-07 MED ORDER — INSULIN ASPART PROT & ASPART (70-30 MIX) 100 UNIT/ML ~~LOC~~ SUSP
15.0000 [IU] | Freq: Two times a day (BID) | SUBCUTANEOUS | Status: DC
  Administered 2012-01-08 – 2012-01-10 (×5): 15 [IU] via SUBCUTANEOUS
  Filled 2012-01-07: qty 3

## 2012-01-07 MED ORDER — DOCUSATE SODIUM 100 MG PO CAPS
100.0000 mg | ORAL_CAPSULE | Freq: Two times a day (BID) | ORAL | Status: DC
  Administered 2012-01-07 – 2012-01-13 (×12): 100 mg via ORAL
  Filled 2012-01-07 (×12): qty 1

## 2012-01-07 MED ORDER — METHOCARBAMOL 500 MG PO TABS
500.0000 mg | ORAL_TABLET | Freq: Four times a day (QID) | ORAL | Status: DC | PRN
  Administered 2012-01-12: 500 mg via ORAL
  Filled 2012-01-07: qty 1

## 2012-01-07 MED ORDER — CHLORTHALIDONE 25 MG PO TABS
25.0000 mg | ORAL_TABLET | Freq: Every day | ORAL | Status: DC
  Administered 2012-01-07 – 2012-01-08 (×2): 25 mg via ORAL
  Filled 2012-01-07 (×2): qty 1

## 2012-01-07 MED ORDER — CEFAZOLIN SODIUM 1-5 GM-% IV SOLN
1.0000 g | Freq: Three times a day (TID) | INTRAVENOUS | Status: DC
  Administered 2012-01-07 – 2012-01-09 (×6): 1 g via INTRAVENOUS
  Filled 2012-01-07 (×8): qty 50

## 2012-01-07 MED ORDER — SODIUM CHLORIDE 0.9 % IJ SOLN
3.0000 mL | INTRAMUSCULAR | Status: DC | PRN
  Administered 2012-01-08: 3 mL via INTRAVENOUS

## 2012-01-07 MED ORDER — HEPARIN SODIUM (PORCINE) 5000 UNIT/ML IJ SOLN
5000.0000 [IU] | Freq: Three times a day (TID) | INTRAMUSCULAR | Status: DC
  Administered 2012-01-07 – 2012-01-13 (×17): 5000 [IU] via SUBCUTANEOUS
  Filled 2012-01-07 (×20): qty 1

## 2012-01-07 MED ORDER — SODIUM CHLORIDE 0.9 % IV SOLN
250.0000 mL | INTRAVENOUS | Status: DC | PRN
  Administered 2012-01-10: 1000 mL via INTRAVENOUS

## 2012-01-07 MED ORDER — ONDANSETRON HCL 4 MG PO TABS: 4.0000 mg | ORAL_TABLET | Freq: Four times a day (QID) | ORAL | Status: DC | PRN

## 2012-01-07 MED ORDER — INSULIN ASPART 100 UNIT/ML ~~LOC~~ SOLN
0.0000 [IU] | Freq: Three times a day (TID) | SUBCUTANEOUS | Status: DC
  Administered 2012-01-08: 3 [IU] via SUBCUTANEOUS
  Administered 2012-01-08: 2 [IU] via SUBCUTANEOUS
  Administered 2012-01-08 – 2012-01-09 (×2): 3 [IU] via SUBCUTANEOUS
  Administered 2012-01-09: 2 [IU] via SUBCUTANEOUS
  Administered 2012-01-09 – 2012-01-10 (×2): 3 [IU] via SUBCUTANEOUS
  Administered 2012-01-10: 1 [IU] via SUBCUTANEOUS
  Administered 2012-01-10: 7 [IU] via SUBCUTANEOUS
  Administered 2012-01-11: 2 [IU] via SUBCUTANEOUS
  Administered 2012-01-11: 4 [IU] via SUBCUTANEOUS
  Administered 2012-01-11: 3 [IU] via SUBCUTANEOUS
  Administered 2012-01-12: 5 [IU] via SUBCUTANEOUS
  Administered 2012-01-12 – 2012-01-13 (×4): 2 [IU] via SUBCUTANEOUS
  Filled 2012-01-07: qty 3

## 2012-01-07 MED ORDER — DOXYCYCLINE HYCLATE 100 MG PO TABS
100.0000 mg | ORAL_TABLET | Freq: Two times a day (BID) | ORAL | Status: DC
  Administered 2012-01-07 – 2012-01-08 (×2): 100 mg via ORAL
  Filled 2012-01-07 (×3): qty 1

## 2012-01-07 MED ORDER — VITAMIN B-1 100 MG PO TABS
100.0000 mg | ORAL_TABLET | Freq: Every day | ORAL | Status: DC
  Administered 2012-01-07 – 2012-01-13 (×7): 100 mg via ORAL
  Filled 2012-01-07 (×7): qty 1

## 2012-01-07 MED ORDER — FERROUS SULFATE 325 (65 FE) MG PO TABS
325.0000 mg | ORAL_TABLET | Freq: Two times a day (BID) | ORAL | Status: DC
  Administered 2012-01-08 – 2012-01-11 (×7): 325 mg via ORAL
  Filled 2012-01-07 (×10): qty 1

## 2012-01-07 MED ORDER — ZOLPIDEM TARTRATE 5 MG PO TABS: 5.0000 mg | ORAL_TABLET | Freq: Every evening | ORAL | Status: DC | PRN

## 2012-01-07 MED ORDER — FOLIC ACID 1 MG PO TABS
1.0000 mg | ORAL_TABLET | Freq: Every day | ORAL | Status: DC
  Administered 2012-01-07 – 2012-01-13 (×7): 1 mg via ORAL
  Filled 2012-01-07 (×7): qty 1

## 2012-01-07 MED ORDER — SODIUM CHLORIDE 0.9 % IV SOLN
INTRAVENOUS | Status: DC
  Administered 2012-01-07: 19:00:00 via INTRAVENOUS

## 2012-01-07 MED ORDER — ONDANSETRON HCL 4 MG/2ML IJ SOLN: 4.0000 mg | Freq: Four times a day (QID) | INTRAMUSCULAR | Status: DC | PRN

## 2012-01-07 MED ORDER — OXYCODONE-ACETAMINOPHEN 5-325 MG PO TABS
1.0000 | ORAL_TABLET | ORAL | Status: DC | PRN
  Administered 2012-01-07 – 2012-01-13 (×19): 2 via ORAL
  Filled 2012-01-07 (×15): qty 2
  Filled 2012-01-07: qty 1
  Filled 2012-01-07 (×3): qty 2
  Filled 2012-01-07: qty 1

## 2012-01-07 NOTE — ED Notes (Signed)
Attempted to call report x 1.  Given name and number and nurse, Kristi, to call back.

## 2012-01-07 NOTE — ED Provider Notes (Signed)
History     CSN: [id number]  Arrival date & time 01/07/12  1323   First MD Initiated Contact with Patient 01/07/12 1447      Chief Complaint  Patient presents with  . Foot Pain    (Consider location/radiation/quality/duration/timing/severity/associated sxs/prior Treatment)  The history is provided by the spouse.    Patient relates she was discharged on the 20th after admission by Dr. Fredna Dow for infection over the left thumb, which was amputated. She is still going to physical therapy for that. She relates her blood sugar used to be in the 300-400 range and now is been in the 200-250 range. She relates about 3 days ago her feet started swelling and the next day they started draining. She denies fever, chills, shortness of breath, chest pain. She denies having any new shoes that she is wearing. She states her legs have swollen before but never stayed is swollen.  PCP triad at [facility] Dr. Fredna Dow  Past Medical History  Diagnosis Date  . Diabetes mellitus type 2, uncontrolled   . Hypertension   . Anemia   . Alcohol dependency   . Diabetes mellitus     type 2  . Necrotizing fasciitis     Past Surgical History  Procedure Date  . Amputation     right first and left middle finger amputation 2/2 OM 08/2011 by Dr. Amedeo Plenty  . Cesarean section   . I&d left thumb 12/23/2011  . Tubal ligation   . I&d extremity 12/24/2011    Procedure: IRRIGATION AND DEBRIDEMENT EXTREMITY;  Surgeon: Tennis Must, MD;  Location: [place];  Service: Orthopedics;  Laterality: Left;  I&Dleft thumb with partial amputation  . I&d extremity 12/26/2011    Procedure: IRRIGATION AND DEBRIDEMENT EXTREMITY;  Surgeon: Tennis Must, MD;  Location: [place];  Service: Orthopedics;  Laterality: Left;  . I&d extremity 12/23/2011    Procedure: IRRIGATION AND DEBRIDEMENT EXTREMITY;  Surgeon: Tennis Must, MD;  Location: [place];  Service: Orthopedics;  Laterality: Left;  I&D Left thumb, hand and arm    Family History    Problem Relation Age of Onset  . Diabetes Mother   . Diabetes Father     History  Substance Use Topics  . Smoking status: Never Smoker   . Smokeless tobacco: Never Used  . Alcohol Use: Yes     h/o alcohol abuse   lives with fianc and son  OB History    Grav Para Term Preterm Abortions TAB SAB Ect Mult Living                  Review of Systems  All other systems reviewed and are negative.    Allergies  Review of patient's allergies indicates no known allergies.  Home Medications   Current Outpatient Rx  Name Route Sig Dispense Refill  . AMLODIPINE BESYLATE 10 MG PO TABS Oral Take 10 mg by mouth daily.    [name] DOXYCYCLINE HYCLATE 100 MG PO TABS Oral Take 1 tablet (100 mg total) by mouth 2 (two) times daily. 60 tablet 0  . FERROUS SULFATE 325 (65 FE) MG PO TABS Oral Take 325 mg by mouth 2 (two) times daily with a meal.     . FOLIC ACID 1 MG PO TABS Oral Take 1 mg by mouth daily.     . INSULIN ISOPHANE & REGULAR (70-30) 100 UNIT/ML Coffee SUSP  Please inject 15 units twice daily, one is before breakfast and another is before dinner. 10  mL 12  . METFORMIN HCL 1000 MG PO TABS Oral Take 1 tablet (1,000 mg total) by mouth 2 (two) times daily with a meal. 60 tablet 6  . METHOCARBAMOL 500 MG PO TABS Oral Take 1 tablet (500 mg total) by mouth every 6 (six) hours as needed. 30 tablet 3  . OXYCODONE-ACETAMINOPHEN 5-325 MG PO TABS Oral Take 1-2 tablets by mouth every 4 (four) hours as needed. 30 tablet 0  . THIAMINE HCL 100 MG PO TABS Oral Take 1 tablet (100 mg total) by mouth daily. 30 tablet 3    BP 174/78  Pulse 96  Temp(Src) 98.2 F (36.8 C) (Oral)  Resp 20  SpO2 100%  LMP 11/22/2011  Vital signs normal except hypertension   Physical Exam  Nursing note and vitals reviewed. Constitutional: She is oriented to person, place, and time. She appears well-developed and well-nourished.  Non-toxic appearance. She does not appear ill. No distress.  HENT:  Head: Normocephalic and  atraumatic.  Right Ear: External ear normal.  Left Ear: External ear normal.  Nose: Nose normal. No mucosal edema or rhinorrhea.  Mouth/Throat: Oropharynx is clear and moist and mucous membranes are normal. No dental abscesses or uvula swelling.  Eyes: Conjunctivae and EOM are normal. Pupils are equal, round, and reactive to light.  Neck: Normal range of motion and full passive range of motion without pain. Neck supple.  Pulmonary/Chest: Effort normal. No respiratory distress. She has no rhonchi. She exhibits no crepitus.  Abdominal: Normal appearance.  Musculoskeletal:       Patient noted to have an amputation of her left index finger, her right palm appears to be gone and she has a wrap on her left hand. We removed her she is patient has moderate swelling of her feet and ankles with large disrupted bullous lesions. She has a large bullous lesion on the medial aspect of her right foot with drainage of serosanguineous fluid. She has another large disrupted bullous lesion on her left lateral foot with drainage of serous sanguinous fluid. Her feet feel very warm to touch. She has scattered superficial ulcers on her right lower leg and very stages of healing. She also has some pigmentation changes consistent with lesions.  Neurological: She is alert and oriented to person, place, and time. She has normal strength. No cranial nerve deficit.  Skin: Skin is warm, dry and intact. No rash noted. No erythema. No pallor.  Psychiatric: She has a normal mood and affect. Her speech is normal and behavior is normal. Her mood appears not anxious.    ED Course  Procedures (including critical care time)  Results for orders placed during the hospital encounter of 01/07/12  CBC      Component Value Range   WBC 11.1 (*) 4.0 - 10.5 (K/uL)   RBC 3.54 (*) 3.87 - 5.11 (MIL/uL)   Hemoglobin 8.3 (*) 12.0 - 15.0 (g/dL)   HCT 26.6 (*) 36.0 - 46.0 (%)   MCV 75.1 (*) 78.0 - 100.0 (fL)   MCH 23.4 (*) 26.0 - 34.0 (pg)     MCHC 31.2  30.0 - 36.0 (g/dL)   RDW 13.3  11.5 - 15.5 (%)   Platelets 426 (*) 150 - 400 (K/uL)  DIFFERENTIAL      Component Value Range   Neutrophils Relative 79 (*) 43 - 77 (%)   Neutro Abs 8.8 (*) 1.7 - 7.7 (K/uL)   Lymphocytes Relative 14  12 - 46 (%)   Lymphs Abs 1.6  0.7 -  4.0 (K/uL)   Monocytes Relative 5  3 - 12 (%)   Monocytes Absolute 0.5  0.1 - 1.0 (K/uL)   Eosinophils Relative 2  0 - 5 (%)   Eosinophils Absolute 0.2  0.0 - 0.7 (K/uL)   Basophils Relative 0  0 - 1 (%)   Basophils Absolute 0.0  0.0 - 0.1 (K/uL)  COMPREHENSIVE METABOLIC PANEL      Component Value Range   Sodium 140  135 - 145 (mEq/L)   Potassium 3.8  3.5 - 5.1 (mEq/L)   Chloride 104  96 - 112 (mEq/L)   CO2 24  19 - 32 (mEq/L)   Glucose, Bld 103 (*) 70 - 99 (mg/dL)   BUN 12  6 - 23 (mg/dL)   Creatinine, Ser 0.73  0.50 - 1.10 (mg/dL)   Calcium 8.7  8.4 - 10.5 (mg/dL)   Total Protein 7.7  6.0 - 8.3 (g/dL)   Albumin 2.3 (*) 3.5 - 5.2 (g/dL)   AST 10  0 - 37 (U/L)   ALT 6  0 - 35 (U/L)   Alkaline Phosphatase 89  39 - 117 (U/L)   Total Bilirubin 0.2 (*) 0.3 - 1.2 (mg/dL)   GFR calc non Af Amer >90  >90 (mL/min)   GFR calc Af Amer >90  >90 (mL/min)  GLUCOSE, CAPILLARY      Component Value Range   Glucose-Capillary 103 (*) 70 - 99 (mg/dL)  URINALYSIS, ROUTINE W REFLEX MICROSCOPIC      Component Value Range   Color, Urine YELLOW  YELLOW    APPearance CLEAR  CLEAR    Specific Gravity, Urine 1.011  1.005 - 1.030    pH 6.5  5.0 - 8.0    Glucose, UA NEGATIVE  NEGATIVE (mg/dL)   Hgb urine dipstick MODERATE (*) NEGATIVE    Bilirubin Urine NEGATIVE  NEGATIVE    Ketones, ur NEGATIVE  NEGATIVE (mg/dL)   Protein, ur >300 (*) NEGATIVE (mg/dL)   Urobilinogen, UA 0.2  0.0 - 1.0 (mg/dL)   Nitrite NEGATIVE  NEGATIVE    Leukocytes, UA TRACE (*) NEGATIVE   URINE MICROSCOPIC-ADD ON      Component Value Range   Squamous Epithelial / LPF FEW (*) RARE    WBC, UA 3-6  <3 (WBC/hpf)   RBC / HPF 7-10  <3 (RBC/hpf)    Bacteria, UA RARE  RARE    Casts GRANULAR CAST (*) NEGATIVE   GLUCOSE, CAPILLARY      Component Value Range   Glucose-Capillary 260 (*) 70 - 99 (mg/dL)   Comment 1 Documented in Chart     Comment 2 Notify RN     Laboratory interpretation all normal except leukocytosis, mild anemia   Dg Chest 2 View  01/07/2012  *RADIOLOGY REPORT*  Clinical Data: Increased peripheral edema.  Diabetes.  CHEST - 2 VIEW  Comparison: 08/17/2011  Findings: Increased linear opacity in the posterior left lower lobe may represent atelectasis or scarring.  No evidence of pulmonary air space disease or congestive heart failure.  No evidence of pleural effusion.  Mild cardiomegaly stable.  IMPRESSION:  1.  Mild left lower lobe atelectasis versus scarring. 2.  Stable mild cardiomegaly.  No evidence of congestive heart failure.  Original Report Authenticated By: Marlaine Hind, M.D.   Dg Foot Complete Left  01/07/2012  *RADIOLOGY REPORT*  Clinical Data: Pain and swelling.  Diabetes.  LEFT FOOT - COMPLETE 3+ VIEW  Comparison: None.  Findings: Soft tissue deformity is seen laterally.  There is no evidence of osteomyelitis, fracture, dislocation, degenerative change or other focal lesion.  Incidental plantar calcaneal spur.  IMPRESSION: Soft tissue deformity laterally.  No osseous finding.  Original Report Authenticated By: Jules Schick, M.D.   Dg Foot Complete Right  01/07/2012  *RADIOLOGY REPORT*  Clinical Data: Swelling.  Diabetes.  RIGHT FOOT COMPLETE - 3+ VIEW  Comparison: None.  Findings: There is no evidence of osteomyelitis.  No fracture or dislocation.  No other focal osseous lesion of significance. Incidental plantar calcaneal spur.  IMPRESSION: No osseous finding.  Original Report Authenticated By: Jules Schick, M.D.     1. Blister of foot   2. Anemia   3. DM (diabetes mellitus)     Rolland Porter, MD, [place], MD 01/07/12 2153

## 2012-01-07 NOTE — ED Notes (Signed)
IV team to bedside. 

## 2012-01-07 NOTE — ED Notes (Signed)
I gave the patient a warm blanket. 

## 2012-01-07 NOTE — ED Notes (Signed)
Pt voided into bedpan.  Linens changed

## 2012-01-07 NOTE — ED Notes (Signed)
Teaching service at bedside to speak with pt

## 2012-01-07 NOTE — ED Notes (Signed)
IV team paged.  

## 2012-01-07 NOTE — H&P (Signed)
Hospital Admission Note Date: 01/07/2012  Patient name: [name] Medical record number: [id number] Date of birth: Jan 08, 1966 Age: 46 y.o. Gender: female PCP: No primary provider on file.  Medical Service: [facility] Attending physician: Dr. Carlyle Basques   1st Contact:  Dr. Elnora Morrison   Pager: [phone] 2nd Contact:  Dr. Janell Quiet       Pager: [phone] After 5 pm or weekends: 1st Contact:      Pager: [phone] 2nd Contact:      Pager: [phone]  Chief Complaint: Feet swelling.  History of Present Illness: This is 46 year old female with PMH of DM II, HTN, ETOH abuse and recent hospitalization of left thumb necrotizing faciitis who presents to the ED with bilateral lower extremities swelling and skin breakdown. The history is provided by patient and her husband. Patient states that she noticed her feet swelling a few days ago, accompanied with some blisters. The right foot blister "popped open" 2 days ago and some whitish-yellowish drainage noted, and left foot "popped open" today as well. Patient did not seek any medical attention until today when she presents to ED for further evaluation.  Denies headache, fever, or sore throat. Denies shortness of breath or dyspnea on exertion. Denies chest pain, chest pressure or palpitation. Denies nausea, vomiting, or abdominal pain. Denies melena, diarrhea or incontinence. Denies muscle weakness.  Denies depression. No appetite or weight changes.   Of note, she was admitted on 12/23/11 for left thumb necrotizing fasciitis, which was surgically I&D'd along with partial amputation by Dr. Fredna Dow on 12/24/11 and 12/26/11. Patient was treated with Vanc and Zosyn and discharged with 4-week Doxycycline therapy. Patient states that she had been compliant with her medications.    Meds:  Medications Prior to Admission  Medication Sig Dispense Refill  . amLODipine (NORVASC) 10 MG tablet Take 10 mg by mouth daily.      [name]  doxycycline (VIBRA-TABS) 100 MG tablet Take 1 tablet (100 mg total) by mouth 2 (two) times daily.  60 tablet  0  . ferrous sulfate 325 (65 FE) MG tablet Take 325 mg by mouth 2 (two) times daily with a meal.       . folic acid (FOLVITE) 1 MG tablet Take 1 mg by mouth daily.       . insulin NPH-insulin regular (NOVOLIN 70/30) (70-30) 100 UNIT/ML injection Please inject 15 units twice daily, one is before breakfast and another is before dinner.  10 mL  12  . metFORMIN (GLUCOPHAGE) 1000 MG tablet Take 1 tablet (1,000 mg total) by mouth 2 (two) times daily with a meal.  60 tablet  6  . methocarbamol (ROBAXIN) 500 MG tablet Take 1 tablet (500 mg total) by mouth every 6 (six) hours as needed.  30 tablet  3  . oxyCODONE-acetaminophen (PERCOCET) 5-325 MG per tablet Take 1-2 tablets by mouth every 4 (four) hours as needed.  30 tablet  0  . thiamine 100 MG tablet Take 1 tablet (100 mg total) by mouth daily.  30 tablet  3    Allergies: Review of patient's allergies indicates no known allergies. Past Medical History  Diagnosis Date  . Diabetes mellitus type 2, uncontrolled   . Hypertension   . Anemia   . Alcohol dependency   . Diabetes mellitus     type 2  . Necrotizing fasciitis    Past Surgical History  Procedure Date  . Amputation     right first and left  middle finger amputation 2/2 OM 08/2011 by Dr. Amedeo Plenty  . Cesarean section   . I&d left thumb 12/23/2011  . Tubal ligation   . I&d extremity 12/24/2011    Procedure: IRRIGATION AND DEBRIDEMENT EXTREMITY;  Surgeon: Tennis Must, MD;  Location: [place];  Service: Orthopedics;  Laterality: Left;  I&Dleft thumb with partial amputation  . I&d extremity 12/26/2011    Procedure: IRRIGATION AND DEBRIDEMENT EXTREMITY;  Surgeon: Tennis Must, MD;  Location: [place];  Service: Orthopedics;  Laterality: Left;  . I&d extremity 12/23/2011    Procedure: IRRIGATION AND DEBRIDEMENT EXTREMITY;  Surgeon: Tennis Must, MD;  Location: [place];  Service: Orthopedics;   Laterality: Left;  I&D Left thumb, hand and arm   Family History  Problem Relation Age of Onset  . Diabetes Mother   . Diabetes Father    History   Social History  . Marital Status: Single    Spouse Name: N/A    Number of Children: 3  . Years of Education: N/A   Occupational History  .     Social History Main Topics  . Smoking status: Never Smoker   . Smokeless tobacco: Never Used  . Alcohol Use: Yes     h/o alcohol abuse  . Drug Use: No  . Sexually Active: Yes   Other Topics Concern  . Not on file   Social History Narrative       Review of Systems: See above HPI  Physical Exam: Blood pressure 169/87, pulse 100, temperature 99.5 F (37.5 C), temperature source Oral, resp. rate 16, last menstrual period 12/23/2011, SpO2 99.00%. General: alert, well-developed, and cooperative to examination.  Head: normocephalic and atraumatic.  Eyes: vision grossly intact, pupils equal, pupils round, pupils reactive to light, no injection and anicteric.  Mouth: pharynx pink and moist, no erythema, and no exudates.  Neck: supple, full ROM, no thyromegaly, no JVD, and no carotid bruits.  Lungs: normal respiratory effort, no accessory muscle use, normal breath sounds, no crackles, and no wheezes. Heart: normal rate, regular rhythm, right 2nd intercostal space 4/6 systolic murmur noted radiating to her neck. no gallop, and no rub.  Abdomen: soft, non-tender, normal bowel sounds, no distention, no guarding, no rebound tenderness, no hepatomegaly, and no splenomegaly.  Msk: no joint swelling, no joint warmth, and no redness over joints.  Pulses: 1+ DP/PT pulses bilaterally Extremities: No cyanosis or clubbing. B/L feet and ankle 3+ pitting edema and B/L lower legs 1+ pitting edema.  B/L feet significant skin breakdown noted with underlying erythema and serosanguinous drainage noted. The lesions are at the medial side of right foot and lateral side of left foot.  Neurologic: alert &  oriented X3, cranial nerves II-XII intact, strength normal in all extremities, sensation intact to light touch, and gait normal.  Psych: Oriented X3, memory intact for recent and remote, normally interactive, good eye contact, not anxious appearing, and not depressed appearing.    Lab results: Basic Metabolic Panel:  Basename 01/07/12 1520  Yeily Link 140  K 3.8  CL 104  CO2 24  GLUCOSE 103*  BUN 12  CREATININE 0.73  CALCIUM 8.7  MG --  PHOS --   Liver Function Tests:  Basename 01/07/12 1520  AST 10  ALT 6  ALKPHOS 89  BILITOT 0.2*  PROT 7.7  ALBUMIN 2.3*   CBC:  Basename 01/07/12 1520  WBC 11.1*  NEUTROABS 8.8*  HGB 8.3*  HCT 26.6*  MCV 75.1*  PLT 426*  Basename 01/07/12 1749  GLUCAP 103*   Urinalysis:  Basename 01/07/12 1905  COLORURINE YELLOW  LABSPEC 1.011  PHURINE 6.5  GLUCOSEU NEGATIVE  HGBUR MODERATE*  BILIRUBINUR NEGATIVE  KETONESUR NEGATIVE  PROTEINUR >300*  UROBILINOGEN 0.2  NITRITE NEGATIVE  LEUKOCYTESUR TRACE*    Imaging results:  Dg Chest 2 View  01/07/2012  *RADIOLOGY REPORT*  Clinical Data: Increased peripheral edema.  Diabetes.  CHEST - 2 VIEW  Comparison: 08/17/2011  Findings: Increased linear opacity in the posterior left lower lobe may represent atelectasis or scarring.  No evidence of pulmonary air space disease or congestive heart failure.  No evidence of pleural effusion.  Mild cardiomegaly stable.  IMPRESSION:  1.  Mild left lower lobe atelectasis versus scarring. 2.  Stable mild cardiomegaly.  No evidence of congestive heart failure.  Original Report Authenticated By: Marlaine Hind, M.D.   Dg Foot Complete Left  01/07/2012  *RADIOLOGY REPORT*  Clinical Data: Pain and swelling.  Diabetes.  LEFT FOOT - COMPLETE 3+ VIEW  Comparison: None.  Findings: Soft tissue deformity is seen laterally.  There is no evidence of osteomyelitis, fracture, dislocation, degenerative change or other focal lesion.  Incidental plantar calcaneal spur.   IMPRESSION: Soft tissue deformity laterally.  No osseous finding.  Original Report Authenticated By: Jules Schick, M.D.   Dg Foot Complete Right  01/07/2012  *RADIOLOGY REPORT*  Clinical Data: Swelling.  Diabetes.  RIGHT FOOT COMPLETE - 3+ VIEW  Comparison: None.  Findings: There is no evidence of osteomyelitis.  No fracture or dislocation.  No other focal osseous lesion of significance. Incidental plantar calcaneal spur.  IMPRESSION: No osseous finding.  Original Report Authenticated By: Jules Schick, M.D.      Assessment & Plan by Problem: # Bilateral feet swelling and significant skin breakdown.    This patient presents with a few days of  B/L feet swelling and some blisters "Popped open" on 1/31 and 2/1. Patient states that she has never had LE swelling in the past. Patient was just discharged on 12/29/11 after hospitalization of I&D and partial amputation for left thumb necrotizing fasciitis, and she states that her insulin regimen was adjusted and increased for a better control of her BG.     The etiology of her newly noted bilateral lower extremities swelling is likely due to the better control of her BG in the setting of losing hyperglycemia-induced diuresis, and her chronic treatment of CCB could be a contributing factor as well.    Of note,  She denies fever but does have leukocytosis with left shift which is new from her discharge.   Plan: - admit to Regular floor for OB. - Start Cefazolin in addition to Doxycycline which she is currently on for better synergy - will obtain wound care consult in am. - D/C amlodipine as this may contributing to her swelling. - check 2 D echo for murmur on Physical exam and new LE edema. - may consider orthopedic vs surgical consult if worsening given the recent hx of necrotizing fasciitis.   # recent hospitalization of left thumb necrotizing faciitis and I&D along with partial amputation on 1/18 and 1/20.    Left thumb wrapped and appeared to be  free of infection. Patient has been compliant with her ANX treatment of Doxycycline. She has a F/U appt with Dr. Fredna Dow at 12/31/11.  #DM, last HbA1C 12.3 on 12/23/11  Plan -will monitor CBG - SSI -continue Novolin 70/30 15units BID -hold metformin for now  #HTN, chronic  hx  And on amlodipine.  Plan  -D/C CCB amlodipine - start Chlorthalidone   # Alcohol abuse  plan - CIWA protocol - continue folic acid and thiamine  # VTE: Heparin SQ   Signed: [facility] 01/07/2012, 8:45 PM

## 2012-01-07 NOTE — ED Notes (Signed)
Having fluid come out ankles  Started a couple of days ago swelling  also

## 2012-01-07 NOTE — Progress Notes (Addendum)
ANTIBIOTIC CONSULT NOTE - INITIAL  Pharmacy Consult for Ancef Indication: Right and left foot cellulitis  No Known Allergies  Patient Measurements: Height: 5\' 6"  (167.6 cm) Weight: 182 lb 8 oz (82.781 kg) IBW/kg (Calculated) : 59.3    Vital Signs: Temp: 99.2 F (37.3 C) (02/01 2054) Temp src: Oral (02/01 2054) BP: 148/85 mmHg (02/01 2054) Pulse Rate: 103  (02/01 2054) Intake/Output from previous day:   Intake/Output from this shift:    Labs:  Basename 01/07/12 1520  WBC 11.1*  HGB 8.3*  PLT 426*  LABCREA --  CREATININE 0.73   Estimated Creatinine Clearance: 97.3 ml/min (by C-G formula based on Cr of 0.73). No results found for this basename: VANCOTROUGH:2,VANCOPEAK:2,VANCORANDOM:2,GENTTROUGH:2,GENTPEAK:2,GENTRANDOM:2,TOBRATROUGH:2,TOBRAPEAK:2,TOBRARND:2,AMIKACINPEAK:2,AMIKACINTROU:2,AMIKACIN:2, in the last 72 hours   Microbiology: Recent Results (from the past 720 hour(s))  CULTURE, BLOOD (ROUTINE X 2)     Status: Normal   Collection Time   12/23/11 11:05 AM      Component Value Range Status Comment   Specimen Description BLOOD ARM LEFT   Final    Special Requests     Final    Value: BOTTLES DRAWN AEROBIC AND ANAEROBIC 10CC AER 4CC ANA   Culture  Setup Time [id number]   Final    Culture NO GROWTH 5 DAYS   Final    Report Status 12/29/2011 FINAL   Final   CULTURE, BLOOD (ROUTINE X 2)     Status: Normal   Collection Time   12/23/11 11:10 AM      Component Value Range Status Comment   Specimen Description BLOOD RIGHT HAND   Final    Special Requests BOTTLES DRAWN AEROBIC ONLY 5CC   Final    Culture  Setup Time [id number]   Final    Culture NO GROWTH 5 DAYS   Final    Report Status 12/29/2011 FINAL   Final   ANAEROBIC CULTURE     Status: Normal   Collection Time   12/23/11  3:38 PM      Component Value Range Status Comment   Specimen Description ABSCESS THUMB LEFT   Final    Special Requests NONE   Final    Gram Stain     Final    Value: ABUNDANT WBC  PRESENT,BOTH PMN AND MONONUCLEAR     ABUNDANT GRAM POSITIVE COCCI     IN PAIRS IN CLUSTERS Performed at [facility] Gram Stain Report Called to,Read Back By and Verified With: Gram Stain Report Called to,Read Back By and Verified With: DR HEWITT [id number] 12/23/11 A BROWNING   Culture NO ANAEROBES ISOLATED   Final    Report Status 12/28/2011 FINAL   Final   CULTURE, ROUTINE-ABSCESS     Status: Normal   Collection Time   12/23/11  3:38 PM      Component Value Range Status Comment   Specimen Description ABSCESS THUMB LEFT   Final    Special Requests NONE   Final    Gram Stain     Final    Value: ABUNDANT WBC PRESENT,BOTH PMN AND MONONUCLEAR     ABUNDANT GRAM POSITIVE COCCI     IN PAIRS IN CLUSTERS Performed at [facility] Gram Stain Report Called to,Read Back By and Verified With: Gram Stain Report Called to,Read Back By and Verified With: DR HEWITT [id number] 12/23/11 A BROWNING   Culture FEW VIRIDANS STREPTOCOCCUS   Final    Report Status 12/27/2011 FINAL   Final   GRAM STAIN     Status: Normal  Collection Time   12/23/11  3:38 PM      Component Value Range Status Comment   Specimen Description ABSCESS THUMB LEFT   Final    Special Requests NONE   Final    Gram Stain     Final    Value: ABUNDANT WBC PRESENT,BOTH PMN AND MONONUCLEAR     ABUNDANT GRAM POSITIVE COCCI IN CLUSTERS IN PAIRS     Gram Stain Report Called to,Read Back By and Verified With: Dr Doran Durand [id number] 12/23/11 A Browning   Report Status 12/23/2011 FINAL   Final   ANAEROBIC CULTURE     Status: Normal   Collection Time   12/23/11  3:46 PM      Component Value Range Status Comment   Specimen Description ABSCESS THUMB LEFT   Final    Special Requests NONE   Final    Gram Stain     Final    Value: NO WBC SEEN     ABUNDANT SQUAMOUS EPITHELIAL CELLS PRESENT     ABUNDANT GRAM POSITIVE COCCI     IN PAIRS ABUNDANT GRAM POSITIVE RODS     ABUNDANT GRAM NEGATIVE RODS   Culture ABUNDANT PEPTOSTREPTOCOCCUS SPECIES   Final     Report Status 12/29/2011 FINAL   Final   CULTURE, ROUTINE-ABSCESS     Status: Normal   Collection Time   12/23/11  3:46 PM      Component Value Range Status Comment   Specimen Description ABSCESS THUMB LEFT   Final    Special Requests NONE   Final    Gram Stain     Final    Value: NO WBC SEEN     ABUNDANT SQUAMOUS EPITHELIAL CELLS PRESENT     ABUNDANT GRAM POSITIVE COCCI     IN PAIRS ABUNDANT GRAM POSITIVE RODS     FEW GRAM NEGATIVE RODS   Culture NO GROWTH 3 DAYS   Final    Report Status 12/27/2011 FINAL   Final     Medical History: Past Medical History  Diagnosis Date  . Diabetes mellitus type 2, uncontrolled   . Hypertension   . Anemia   . Alcohol dependency   . Diabetes mellitus     type 2  . Necrotizing fasciitis     Medications:  Prescriptions prior to admission  Medication Sig Dispense Refill  . amLODipine (NORVASC) 10 MG tablet Take 10 mg by mouth daily.      [name] doxycycline (VIBRA-TABS) 100 MG tablet Take 1 tablet (100 mg total) by mouth 2 (two) times daily.  60 tablet  0  . ferrous sulfate 325 (65 FE) MG tablet Take 325 mg by mouth 2 (two) times daily with a meal.       . folic acid (FOLVITE) 1 MG tablet Take 1 mg by mouth daily.       . insulin NPH-insulin regular (NOVOLIN 70/30) (70-30) 100 UNIT/ML injection Please inject 15 units twice daily, one is before breakfast and another is before dinner.  10 mL  12  . metFORMIN (GLUCOPHAGE) 1000 MG tablet Take 1 tablet (1,000 mg total) by mouth 2 (two) times daily with a meal.  60 tablet  6  . methocarbamol (ROBAXIN) 500 MG tablet Take 1 tablet (500 mg total) by mouth every 6 (six) hours as needed.  30 tablet  3  . oxyCODONE-acetaminophen (PERCOCET) 5-325 MG per tablet Take 1-2 tablets by mouth every 4 (four) hours as needed.  30 tablet  0  . thiamine 100  MG tablet Take 1 tablet (100 mg total) by mouth daily.  30 tablet  3   Scheduled:    . chlorthalidone  25 mg Oral Daily  . docusate sodium  100 mg Oral BID  .  doxycycline  100 mg Oral BID  . ferrous sulfate  325 mg Oral BID WC  . folic acid  1 mg Oral Daily  . heparin  5,000 Units Subcutaneous Q8H  . insulin aspart  0-9 Units Subcutaneous TID WC  . insulin aspart protamine-insulin aspart  15 Units Subcutaneous BID WC  . sodium chloride  3 mL Intravenous Q12H  . thiamine  100 mg Oral Daily    Assessment: 46 y.o. F to start Ancef and continue Doxycycline from PTA for for left and right foot cellulitis. In January, the patient was treated with Vanc/Zosyn for a necrotizing fasciitis left thumb infection and the patient was discharged home on doxycycline for 4 weeks (1/23 >> 2/20). Wt: 82.8 kg, SCr: 0.73, CrCl~90-100 ml/min.   Plan:  1. Cefazolin 1 gram IV every 8 hours 2. Will sign off as this time as no additional dose adjustments expected  Alycia Rossetti, PharmD, BCPS Clinical Pharmacist Pager: [phone] 01/07/2012 9:21 PM

## 2012-01-07 NOTE — ED Provider Notes (Signed)
History     CSN: ID:[id number]  Arrival date & time 01/07/12  1323   First MD Initiated Contact with Patient 01/07/12 1447      Chief Complaint  Patient presents with  . Foot Pain   patient with a known history of uncontrolled diabetes, hypertension, alcohol dependence, and recent admission for necrotizing fasciitis of her thumb.  Today c/o 2-3 days of pedal edema, more than normal.  C/o "blisters" on lateral Left foot and also R foot.  Has been cleaning them at home.  Denies pain or fevers.   Recently on Doxycycline and also amlodipine.   (Consider location/radiation/quality/duration/timing/severity/associated sxs/prior treatment) Patient is a 46 y.o. female presenting with lower extremity pain.  Foot Pain Pertinent negatives include no chest pain and no headaches.    Past Medical History  Diagnosis Date  . Diabetes mellitus type 2, uncontrolled   . Hypertension   . Anemia   . Alcohol dependency   . Diabetes mellitus     type 2    Past Surgical History  Procedure Date  . Amputation     right first and left middle finger amputation 2/2 OM 08/2011 by Dr. Amedeo Plenty  . Cesarean section   . I&d left thumb 12/23/2011  . Tubal ligation   . I&d extremity 12/24/2011    Procedure: IRRIGATION AND DEBRIDEMENT EXTREMITY;  Surgeon: Tennis Must, MD;  Location: [place];  Service: Orthopedics;  Laterality: Left;  I&Dleft thumb with partial amputation  . I&d extremity 12/26/2011    Procedure: IRRIGATION AND DEBRIDEMENT EXTREMITY;  Surgeon: Tennis Must, MD;  Location: [place];  Service: Orthopedics;  Laterality: Left;  . I&d extremity 12/23/2011    Procedure: IRRIGATION AND DEBRIDEMENT EXTREMITY;  Surgeon: Tennis Must, MD;  Location: [place];  Service: Orthopedics;  Laterality: Left;  I&D Left thumb, hand and arm    Family History  Problem Relation Age of Onset  . Diabetes Mother   . Diabetes Father     History  Substance Use Topics  . Smoking status: Never Smoker   . Smokeless tobacco:  Never Used  . Alcohol Use: Yes     h/o alcohol abuse    OB History    Grav Para Term Preterm Abortions TAB SAB Ect Mult Living                  Review of Systems  Constitutional: Negative for fever and chills.  HENT: Negative for neck pain.   Respiratory: Negative for cough.   Cardiovascular: Negative for chest pain.  Genitourinary: Negative for dysuria.  Neurological: Negative for headaches.  Psychiatric/Behavioral: Negative for behavioral problems.    Allergies  Review of patient's allergies indicates no known allergies.  Home Medications   Current Outpatient Rx  Name Route Sig Dispense Refill  . AMLODIPINE BESYLATE 10 MG PO TABS Oral Take 10 mg by mouth daily.    [name] DOXYCYCLINE HYCLATE 100 MG PO TABS Oral Take 1 tablet (100 mg total) by mouth 2 (two) times daily. 60 tablet 0  . FERROUS SULFATE 325 (65 FE) MG PO TABS Oral Take 325 mg by mouth 2 (two) times daily with a meal.     . FOLIC ACID 1 MG PO TABS Oral Take 1 mg by mouth daily.     . INSULIN ISOPHANE & REGULAR (70-30) 100 UNIT/ML [place] SUSP  Please inject 15 units twice daily, one is before breakfast and another is before dinner. 10 mL 12  . METFORMIN HCL 1000  MG PO TABS Oral Take 1 tablet (1,000 mg total) by mouth 2 (two) times daily with a meal. 60 tablet 6  . METHOCARBAMOL 500 MG PO TABS Oral Take 1 tablet (500 mg total) by mouth every 6 (six) hours as needed. 30 tablet 3  . OXYCODONE-ACETAMINOPHEN 5-325 MG PO TABS Oral Take 1-2 tablets by mouth every 4 (four) hours as needed. 30 tablet 0  . THIAMINE HCL 100 MG PO TABS Oral Take 1 tablet (100 mg total) by mouth daily. 30 tablet 3    BP 174/78  Pulse 96  Temp(Src) 98.2 F (36.8 C) (Oral)  Resp 20  SpO2 100%  LMP 11/22/2011  Physical Exam  Nursing note and vitals reviewed. Constitutional: She appears well-developed and well-nourished. No distress.  HENT:  Head: Normocephalic.  Eyes: Pupils are equal, round, and reactive to light.  Cardiovascular: Normal  heart sounds.   Pulmonary/Chest: Breath sounds normal.  Abdominal: Soft.  Musculoskeletal: Normal range of motion.       Non pitting edema of b/l ankles.  Chronic venous stasis changes to LE's  Neurological: She is alert.  Skin: Skin is warm and dry.    ED Course  Procedures (including critical care time)   Labs Reviewed  CBC  DIFFERENTIAL  COMPREHENSIVE METABOLIC PANEL   No results found.   No diagnosis found.    MDM  Patient is seen and examined, initial history and physical is completed. Evaluation initiated   Results for orders placed during the hospital encounter of 01/07/12  CBC      Component Value Range   WBC 11.1 (*) 4.0 - 10.5 (K/uL)   RBC 3.54 (*) 3.87 - 5.11 (MIL/uL)   Hemoglobin 8.3 (*) 12.0 - 15.0 (g/dL)   HCT 26.6 (*) 36.0 - 46.0 (%)   MCV 75.1 (*) 78.0 - 100.0 (fL)   MCH 23.4 (*) 26.0 - 34.0 (pg)   MCHC 31.2  30.0 - 36.0 (g/dL)   RDW 13.3  11.5 - 15.5 (%)   Platelets 426 (*) 150 - 400 (K/uL)  DIFFERENTIAL      Component Value Range   Neutrophils Relative 79 (*) 43 - 77 (%)   Neutro Abs 8.8 (*) 1.7 - 7.7 (K/uL)   Lymphocytes Relative 14  12 - 46 (%)   Lymphs Abs 1.6  0.7 - 4.0 (K/uL)   Monocytes Relative 5  3 - 12 (%)   Monocytes Absolute 0.5  0.1 - 1.0 (K/uL)   Eosinophils Relative 2  0 - 5 (%)   Eosinophils Absolute 0.2  0.0 - 0.7 (K/uL)   Basophils Relative 0  0 - 1 (%)   Basophils Absolute 0.0  0.0 - 0.1 (K/uL)  GLUCOSE, CAPILLARY      Component Value Range   Glucose-Capillary 103 (*) 70 - 99 (mg/dL)   Dg Chest 2 View  01/07/2012  *RADIOLOGY REPORT*  Clinical Data: Increased peripheral edema.  Diabetes.  CHEST - 2 VIEW  Comparison: 08/17/2011  Findings: Increased linear opacity in the posterior left lower lobe may represent atelectasis or scarring.  No evidence of pulmonary air space disease or congestive heart failure.  No evidence of pleural effusion.  Mild cardiomegaly stable.  IMPRESSION:  1.  Mild left lower lobe atelectasis versus  scarring. 2.  Stable mild cardiomegaly.  No evidence of congestive heart failure.  Original Report Authenticated By: Marlaine Hind, M.D.   Dg Hand Complete Left  12/23/2011  *RADIOLOGY REPORT*  Clinical Data: Pain and swelling; diabetes  LEFT HAND - COMPLETE 3+ VIEW  Comparison: August 18, 2011  Findings: There is a large amount of gas within the soft tissues of the left thumb.  There is no evidence of osseous erosion or periosteal reaction to suggest osteomyelitis at this time.  There has been amputation of the third distal phalanx.  IMPRESSION: Marked  subcutaneous emphysema involving the left thumb which is consistent with cellulitis from a gas-forming organism.  Original Report Authenticated By: Duayne Cal, M.D.   Dg Foot Complete Left  01/07/2012  *RADIOLOGY REPORT*  Clinical Data: Pain and swelling.  Diabetes.  LEFT FOOT - COMPLETE 3+ VIEW  Comparison: None.  Findings: Soft tissue deformity is seen laterally.  There is no evidence of osteomyelitis, fracture, dislocation, degenerative change or other focal lesion.  Incidental plantar calcaneal spur.  IMPRESSION: Soft tissue deformity laterally.  No osseous finding.  Original Report Authenticated By: Jules Schick, M.D.   Dg Foot Complete Right  01/07/2012  *RADIOLOGY REPORT*  Clinical Data: Swelling.  Diabetes.  RIGHT FOOT COMPLETE - 3+ VIEW  Comparison: None.  Findings: There is no evidence of osteomyelitis.  No fracture or dislocation.  No other focal osseous lesion of significance. Incidental plantar calcaneal spur.  IMPRESSION: No osseous finding.  Original Report Authenticated By: Jules Schick, M.D.     Results for orders placed during the hospital encounter of 01/07/12  CBC      Component Value Range   WBC 11.1 (*) 4.0 - 10.5 (K/uL)   RBC 3.54 (*) 3.87 - 5.11 (MIL/uL)   Hemoglobin 8.3 (*) 12.0 - 15.0 (g/dL)   HCT 26.6 (*) 36.0 - 46.0 (%)   MCV 75.1 (*) 78.0 - 100.0 (fL)   MCH 23.4 (*) 26.0 - 34.0 (pg)   MCHC 31.2  30.0 - 36.0  (g/dL)   RDW 13.3  11.5 - 15.5 (%)   Platelets 426 (*) 150 - 400 (K/uL)  DIFFERENTIAL      Component Value Range   Neutrophils Relative 79 (*) 43 - 77 (%)   Neutro Abs 8.8 (*) 1.7 - 7.7 (K/uL)   Lymphocytes Relative 14  12 - 46 (%)   Lymphs Abs 1.6  0.7 - 4.0 (K/uL)   Monocytes Relative 5  3 - 12 (%)   Monocytes Absolute 0.5  0.1 - 1.0 (K/uL)   Eosinophils Relative 2  0 - 5 (%)   Eosinophils Absolute 0.2  0.0 - 0.7 (K/uL)   Basophils Relative 0  0 - 1 (%)   Basophils Absolute 0.0  0.0 - 0.1 (K/uL)  COMPREHENSIVE METABOLIC PANEL      Component Value Range   Sodium 140  135 - 145 (mEq/L)   Potassium 3.8  3.5 - 5.1 (mEq/L)   Chloride 104  96 - 112 (mEq/L)   CO2 24  19 - 32 (mEq/L)   Glucose, Bld 103 (*) 70 - 99 (mg/dL)   BUN 12  6 - 23 (mg/dL)   Creatinine, Ser 0.73  0.50 - 1.10 (mg/dL)   Calcium 8.7  8.4 - 10.5 (mg/dL)   Total Protein 7.7  6.0 - 8.3 (g/dL)   Albumin 2.3 (*) 3.5 - 5.2 (g/dL)   AST 10  0 - 37 (U/L)   ALT 6  0 - 35 (U/L)   Alkaline Phosphatase 89  39 - 117 (U/L)   Total Bilirubin 0.2 (*) 0.3 - 1.2 (mg/dL)   GFR calc non Af Amer >90  >90 (mL/min)   GFR calc Af  Amer >90  >90 (mL/min)  GLUCOSE, CAPILLARY      Component Value Range   Glucose-Capillary 103 (*) 70 - 99 (mg/dL)  URINALYSIS, ROUTINE W REFLEX MICROSCOPIC      Component Value Range   Color, Urine YELLOW  YELLOW    APPearance CLEAR  CLEAR    Specific Gravity, Urine 1.011  1.005 - 1.030    pH 6.5  5.0 - 8.0    Glucose, UA NEGATIVE  NEGATIVE (mg/dL)   Hgb urine dipstick MODERATE (*) NEGATIVE    Bilirubin Urine NEGATIVE  NEGATIVE    Ketones, ur NEGATIVE  NEGATIVE (mg/dL)   Protein, ur >300 (*) NEGATIVE (mg/dL)   Urobilinogen, UA 0.2  0.0 - 1.0 (mg/dL)   Nitrite NEGATIVE  NEGATIVE    Leukocytes, UA TRACE (*) NEGATIVE   URINE MICROSCOPIC-ADD ON      Component Value Range   Squamous Epithelial / LPF FEW (*) RARE    WBC, UA 3-6  <3 (WBC/hpf)   RBC / HPF 7-10  <3 (RBC/hpf)   Bacteria, UA RARE  RARE     Casts GRANULAR CAST (*) NEGATIVE    Dg Chest 2 View  01/07/2012  *RADIOLOGY REPORT*  Clinical Data: Increased peripheral edema.  Diabetes.  CHEST - 2 VIEW  Comparison: 08/17/2011  Findings: Increased linear opacity in the posterior left lower lobe may represent atelectasis or scarring.  No evidence of pulmonary air space disease or congestive heart failure.  No evidence of pleural effusion.  Mild cardiomegaly stable.  IMPRESSION:  1.  Mild left lower lobe atelectasis versus scarring. 2.  Stable mild cardiomegaly.  No evidence of congestive heart failure.  Original Report Authenticated By: Marlaine Hind, M.D.   Dg Hand Complete Left  12/23/2011  *RADIOLOGY REPORT*  Clinical Data: Pain and swelling; diabetes  LEFT HAND - COMPLETE 3+ VIEW  Comparison: August 18, 2011  Findings: There is a large amount of gas within the soft tissues of the left thumb.  There is no evidence of osseous erosion or periosteal reaction to suggest osteomyelitis at this time.  There has been amputation of the third distal phalanx.  IMPRESSION: Marked  subcutaneous emphysema involving the left thumb which is consistent with cellulitis from a gas-forming organism.  Original Report Authenticated By: Duayne Cal, M.D.   Dg Foot Complete Left  01/07/2012  *RADIOLOGY REPORT*  Clinical Data: Pain and swelling.  Diabetes.  LEFT FOOT - COMPLETE 3+ VIEW  Comparison: None.  Findings: Soft tissue deformity is seen laterally.  There is no evidence of osteomyelitis, fracture, dislocation, degenerative change or other focal lesion.  Incidental plantar calcaneal spur.  IMPRESSION: Soft tissue deformity laterally.  No osseous finding.  Original Report Authenticated By: Jules Schick, M.D.   Dg Foot Complete Right  01/07/2012  *RADIOLOGY REPORT*  Clinical Data: Swelling.  Diabetes.  RIGHT FOOT COMPLETE - 3+ VIEW  Comparison: None.  Findings: There is no evidence of osteomyelitis.  No fracture or dislocation.  No other focal osseous lesion of  significance. Incidental plantar calcaneal spur.  IMPRESSION: No osseous finding.  Original Report Authenticated By: Jules Schick, M.D.            Taleeya Blondin A. Lauris Poag, MD 01/07/12 2004

## 2012-01-07 NOTE — ED Notes (Signed)
Attempted to call report x 2.  Given name and number and nurse to call back in 5 min

## 2012-01-08 DIAGNOSIS — IMO0002 Reserved for concepts with insufficient information to code with codable children: Secondary | ICD-10-CM

## 2012-01-08 DIAGNOSIS — M7989 Other specified soft tissue disorders: Secondary | ICD-10-CM

## 2012-01-08 DIAGNOSIS — E119 Type 2 diabetes mellitus without complications: Secondary | ICD-10-CM

## 2012-01-08 LAB — GLUCOSE, CAPILLARY
Glucose-Capillary: 240 mg/dL — ABNORMAL HIGH (ref 70–99)
Glucose-Capillary: 190 mg/dL — ABNORMAL HIGH (ref 70–99)
Glucose-Capillary: 243 mg/dL — ABNORMAL HIGH (ref 70–99)
Glucose-Capillary: 137 mg/dL — ABNORMAL HIGH (ref 70–99)

## 2012-01-08 LAB — BASIC METABOLIC PANEL
BUN: 17 mg/dL (ref 6–23)
CO2: 25 mEq/L (ref 19–32)
Calcium: 8.5 mg/dL (ref 8.4–10.5)
Chloride: 102 mEq/L (ref 96–112)
Creatinine, Ser: 1.14 mg/dL — ABNORMAL HIGH (ref 0.50–1.10)
GFR calc Af Amer: 67 mL/min — ABNORMAL LOW (ref >90)
GFR calc non Af Amer: 58 mL/min — ABNORMAL LOW (ref >90)
Glucose, Bld: 255 mg/dL — ABNORMAL HIGH (ref 70–99)
Potassium: 3.7 mEq/L (ref 3.5–5.1)
Sodium: 137 mEq/L (ref 135–145)

## 2012-01-08 LAB — CBC
HCT: 23.7 % — ABNORMAL LOW (ref 36.0–46.0)
Hemoglobin: 7.3 g/dL — ABNORMAL LOW (ref 12.0–15.0)
MCH: 23.2 pg — ABNORMAL LOW (ref 26.0–34.0)
MCHC: 30.8 g/dL (ref 30.0–36.0)
MCV: 75.5 fL — ABNORMAL LOW (ref 78.0–100.0)
Platelets: 369 10*3/uL (ref 150–400)
RBC: 3.14 MIL/uL — ABNORMAL LOW (ref 3.87–5.11)
RDW: 13.6 % (ref 11.5–15.5)
WBC: 9.9 10*3/uL (ref 4.0–10.5)

## 2012-01-08 LAB — SEDIMENTATION RATE: Sed Rate: 123 mm/hr — ABNORMAL HIGH (ref 0–22)

## 2012-01-08 LAB — C-REACTIVE PROTEIN: CRP: 8.51 mg/dL — ABNORMAL HIGH (ref <0.60)

## 2012-01-08 MED ORDER — FUROSEMIDE 20 MG PO TABS
20.0000 mg | ORAL_TABLET | Freq: Two times a day (BID) | ORAL | Status: DC
  Administered 2012-01-08 – 2012-01-11 (×7): 20 mg via ORAL
  Filled 2012-01-08 (×10): qty 1

## 2012-01-08 MED ORDER — COLLAGENASE 250 UNIT/GM EX OINT
TOPICAL_OINTMENT | Freq: Every day | CUTANEOUS | Status: DC
  Administered 2012-01-08 – 2012-01-13 (×6): via TOPICAL
  Filled 2012-01-08 (×3): qty 30

## 2012-01-08 MED ORDER — LISINOPRIL 5 MG PO TABS
5.0000 mg | ORAL_TABLET | Freq: Every day | ORAL | Status: DC
  Administered 2012-01-08 – 2012-01-11 (×4): 5 mg via ORAL
  Filled 2012-01-08 (×4): qty 1

## 2012-01-08 NOTE — Progress Notes (Signed)
*  PRELIMINARY RESULTS* Echocardiogram 2D Echocardiogram has been performed.  [name] 01/08/2012, 4:57 PM

## 2012-01-08 NOTE — Progress Notes (Signed)
Nursing:  On patient assessment, patient was noted to have a large popped blister/skin tear on the medial aspect of her right foot and also another medium sized one on the lateral aspect of her left foot.  [place] nurse is supposed to consult.  In the meantime, this nurse has dressed it with Tegaderm on both skin tears for protection.  Will continue to follow.  Darnelle Bos, RN

## 2012-01-08 NOTE — Progress Notes (Signed)
Subjective:  Overall the patient is pleasant and without acute distress. She denies pain around her skin sloughing sites. She denies any changes in her urinary habits. She denies fever, chills, nausea, vomiting, chest pain, shortness of breath, diarrhea, constipation. She denies any pain in her right thumb or hand.   Objective: Vital signs in last 24 hours: Filed Vitals:   01/07/12 1908 01/07/12 2028 01/07/12 2054 01/08/12 0541  BP: 148/80 169/87 148/85 135/64  Pulse: 82 100 103 87  Temp: 98.2 F (36.8 C) 99.5 F (37.5 C) 99.2 F (37.3 C) 98.2 F (36.8 C)  TempSrc: Oral Oral Oral Oral  Resp:  16 18 18   Height:   5\' 6"  (1.676 m)   Weight:   82.781 kg (182 lb 8 oz)   SpO2: 99% 99% 100% 100%   Physical Exam: General: alert, well-developed, and cooperative to examination.  Head: normocephalic and atraumatic.  Eyes: vision grossly intact, pupils equal, pupils round, pupils reactive to light, no injection and anicteric.  Mouth: pharynx pink and moist, no erythema, and no exudates.  Neck: supple, full ROM, no thyromegaly, no JVD, and no carotid bruits.  Lungs: normal respiratory effort, no accessory muscle use, normal breath sounds, no crackles, and no wheezes. Heart: normal rate, regular rhythm,  3/6 systolic murmur noted along the left sternal border. Does not radiate to her neck. no gallop, and no rub.  Abdomen: soft, non-tender, normal bowel sounds, no distention, no guarding, no rebound tenderness, no hepatomegaly, and no splenomegaly.  Msk: no joint swelling, no joint warmth, and no redness over joints.  Pulses: 2+ DP/PT pulses bilaterally Extremities: No cyanosis or clubbing. B/L feet and ankle 3+ pitting edema and B/L lower legs 1+ pitting edema. Darkly pigmented regions, with healing ulcerations on legs bilaterally.  B/L feet significant skin breakdown noted with underlying erythema and serosanguinous drainage noted. The lesions are at the medial side of right foot and lateral side  of left foot. Skin breakdown along the distal lateral aspect of right middle finger.  Neurologic: alert & oriented X3, cranial nerves II-XII intact, sensation intact to pinprick normal.  Psych: Oriented X3, memory intact for recent and remote, normally interactive, good eye contact, not anxious appearing, and not depressed appearing.   Lab Results: Na 137, K 3.7, CO2 25, BUN 17, Cr 1.14, Glucose 240 WBCs 9.9, Hbg 7.3, PLTs 369, MCV 75.5 UA: 300+ protein, 4-6 WBCs, 7-10 RBCs Urine dip: moderate hgb Albumin 2.3  Studies/Results: Dg Chest 2 View  01/07/2012  *RADIOLOGY REPORT*  Clinical Data: Increased peripheral edema.  Diabetes.  CHEST - 2 VIEW  Comparison: 08/17/2011  Findings: Increased linear opacity in the posterior left lower lobe may represent atelectasis or scarring.  No evidence of pulmonary air space disease or congestive heart failure.  No evidence of pleural effusion.  Mild cardiomegaly stable.  IMPRESSION:  1.  Mild left lower lobe atelectasis versus scarring. 2.  Stable mild cardiomegaly.  No evidence of congestive heart failure.  Original Report Authenticated By: Marlaine Hind, M.D.   Dg Foot Complete Left  01/07/2012  *RADIOLOGY REPORT*  Clinical Data: Pain and swelling.  Diabetes.  LEFT FOOT - COMPLETE 3+ VIEW  Comparison: None.  Findings: Soft tissue deformity is seen laterally.  There is no evidence of osteomyelitis, fracture, dislocation, degenerative change or other focal lesion.  Incidental plantar calcaneal spur.  IMPRESSION: Soft tissue deformity laterally.  No osseous finding.  Original Report Authenticated By: Jules Schick, M.D.   Dg Foot Complete  Right  01/07/2012  *RADIOLOGY REPORT*  Clinical Data: Swelling.  Diabetes.  RIGHT FOOT COMPLETE - 3+ VIEW  Comparison: None.  Findings: There is no evidence of osteomyelitis.  No fracture or dislocation.  No other focal osseous lesion of significance. Incidental plantar calcaneal spur.  IMPRESSION: No osseous finding.  Original  Report Authenticated By: Jules Schick, M.D.   Medications: I have reviewed the patient's current medications. Scheduled Meds:   .  ceFAZolin (ANCEF) IV  1 g Intravenous Q8H  . collagenase   Topical Daily  . docusate sodium  100 mg Oral BID  . ferrous sulfate  325 mg Oral BID WC  . folic acid  1 mg Oral Daily  . furosemide  20 mg Oral BID  . heparin  5,000 Units Subcutaneous Q8H  . insulin aspart  0-9 Units Subcutaneous TID WC  . insulin aspart protamine-insulin aspart  15 Units Subcutaneous BID WC  . lisinopril  5 mg Oral Daily  . sodium chloride  3 mL Intravenous Q12H  . thiamine  100 mg Oral Daily  . DISCONTD: chlorthalidone  25 mg Oral Daily  . DISCONTD: doxycycline  100 mg Oral BID   Continuous Infusions:   . DISCONTD: sodium chloride 100 mL/hr at 01/07/12 1832   PRN Meds:.sodium chloride, methocarbamol, ondansetron (ZOFRAN) IV, ondansetron, oxyCODONE-acetaminophen, sodium chloride, zolpidem Assessment/Plan:  Mrs. Tassone is 46 year old female with PMH of DM II, HTN, ETOH abuse and recent hospitalization of left thumb necrotizing faciitis who presents with skin breakdown along her right foot, proteinuria, and new onset cardiac murmur. She is currently stable.   1. Bilateral feet swelling and significant skin breakdown.   Etiology of the patient's blisters and multiple skin remains unclear. Blisters along her lower leg are likely related to swelling. Etiology of swelling unclear. Patient has proteinuria and hypoalbuminemia indicating possible nephrotic syndrome. She has WBCs and  RBCs in her urine potentially indicating interstitial nephritis or nephritic syndrome. Patient's symptoms have begun after starting doxycycline, so this will be discontinued. Patient's Cr is slowly rising, indicating possible renal pathology. Patient's extensive history of skin breakdown and abscesses could indicate a more systemic etiology. Given the patient's new onset of cardiac murmur, skin breakdown,  and renal dysfunction collagen vascular disease or rheumatoid pathology may be possible.  --Consider dermatology consultation  --Consider ANA and ANCA in future to rule out collagen vascular disease, or rheumatologic dysfunction.  --ESR, CRP, total compliment, C3, and C4 to work up inflammation and potential glomerulonephritis etiology.  --Wound care consulted --Dr. Levell July team made aware of patient status --Discontinue doxycycline --Continue Cefazolin for gram positive prophylaxis, given patient's history of necrotizing fasciitis.   2. Cardiac Murmur. Patient has new onset 3/6 murmur along the left sternal border. Murmur seems consistent with either aortic stenosis or tricuspid regurgitation. She is asymptomatic with this new onset murmur. However, new onset tricuspid regurgitation could explain new onset LE edema.  -2D echo pending  3. Recent hospitalization of left thumb necrotizing faciitis and I&D along with partial amputation on 1/18 and 1/20.  Left thumb wrapped and appeared to be free of infection. Doxycycline due to uncertain relationship with renal dysfunction and skin breakdown.  -- Continue Cefazolin for gram positive prophylaxis.   4. Proteinuria Urinanalysis indicates patient has proteinuria, will further evaluate for nephrotic syndrome.  -- Spot urine albumin/Cr   5. DM, last HbA1C 12.3 on 12/23/11  Patient's diabetes has historically been uncontrolled, but was optimized at her last hospital discharge.  --  Monitor CBG -- SSI -- Continue Novolin 70/30 15units BID  -- Holding Metformin  6 HTN, chronic hx And on amlodipine.  Given the patient's new onset LE edema, we will discontinue his amlodipine. He was recently started on Chlorthalidone, but we will switch to lispinpril given proteinuria and diabetes.  -- D/C Amlodipine and Chlorthalidone -- Start Lisinopril   6.  Alcohol abuse  - CIWA protocol  - continue folic acid and thiamine   7.  VTE: Heparin SQ   LOS: 1  day   [name] 01/08/2012, 12:05 PM  ADDENDUM:   Please see the complete note for MS4 for detailed. Patient was seen, examined and chart was reviewed in detail .    S: Patient feels better. Needs the left thumb dressing changed.  BP 149/82  Pulse 63  Temp(Src) 97.4 F (36.3 C) (Oral)  Resp 18  Ht 5\' 11"  (1.803 m)  Wt 167 lb 1.7 oz (75.8 kg)  BMI 23.31 kg/m2  SpO2 98%   Blood pressure 169/87, pulse 100, temperature 99.5 F (37.5 C), temperature source Oral, resp. rate 16, last menstrual period 12/23/2011, SpO2 99.00%.  General: alert, well-developed, and cooperative to examination.  Lungs: normal respiratory effort, no accessory muscle use, normal breath sounds, no crackles, and no wheezes. Heart: normal rate, regular rhythm, right 2nd intercostal space 4/6 systolic murmur noted radiating to her neck. no gallop, and no rub.  Abdomen: soft, non-tender, normal bowel sounds Msk: no joint swelling, no joint warmth, and no redness over joints.  Pulses: 1+ DP/PT pulses bilaterally Extremities: No cyanosis or clubbing. B/L feet and ankle 3+ pitting edema and B/L lower legs 1+ pitting edema.  B/L feet significant skin breakdown noted with underlying erythema and serosanguinous drainage noted. The lesions are at the medial side of right foot and lateral side of left foot.  Neurologic: alert & oriented X3, cranial nerves II-XII intact, strength normal in all extremities, sensation intact to light touch, and gait normal.  Psych: Oriented X3, memory intact for recent and remote, normally interactive, good eye contact, not anxious appearing, and not depressed appearing.     A and plan:   1. DM poorly controlled 2. Bilateral lower extremity swelling with skin breakdown 3. HTN 4. Alcohol abuse 5. VTE  Impression:  1. Poorly controlled DM and HTN with proteinuria and low serum albumin. Check urine [id number]. She may be nephrotic. LFTs nl and profound malnutrition unlikely so renal  cause of edema is likely. Will also order complement panel, esr and CRP. 2. R/O cardiac disease with echo.  3. Ferritin 300 in October 2012. Anemia likely from AOCD. 4. Doubt bacterial cause of pedal lesions.Will d/c doxy and continue ancef for 1 more day until dicussed with Dr. Baxter Flattery. 5. Severe edema can cause blistering and shallow ulcerations but allergic reaction to some exposure in recent admission possible.  6. Use lisinopril and furosemide for HTN and edema as noted by Dr. Stann Mainland. 7. Tighten diabetic control.   Janell Quiet MD [facility] Internal Medicine Resident Pager [phone]

## 2012-01-08 NOTE — H&P (Signed)
59 woman with DM and alcoholism and recent adm for necrotizing fasciitis of thumb (amputation in mid-January).  Adm now for severe bilat pedal edema and extensive blistering of both feet with denudation of skin.  She acknowledges intermittent edema for years but this is the first time it is this bad or persistent.  Also has chronic lesions of both lower legs with pre-tibial hyperpigmentation and ulcerations -- these have come and gone for years. The new pedal problems are not accompanied by any new systemic sx and the surrounding skin is not tender at all.  She has 3+ symmetric edema of lower legs and feet.  Has diffuse dermatophytosis but no cellulitis of skin generally.  Plain films of feet shows only soft tissue swelling. BP 169/87.  Temp 99.5 O2 nl. Residents heard systolic murmur but I do not. -- echo pending.  Lungs clear. Creat = 0.7.  UA with blood and protein.  Serum albumin = 2.3.  Hgb = 8.3 (unchanged from 3 weeks ago).  MCV = 75. (She is gravida 3 and describes longterm menorrhagia. A1c = 12.   Impression:  1.   Poorly controlled DM and HTN with proteinuria and low serum albumin.  Check urine [id number].  She may be nephrotic.  LFTs nl and profound malnutrition unlikely so renal cause of edema is likely.   2.  R/O cardiac disease with echo. 3.  Check ferritin and history of anemia -- may be only menstrual. 4.  Doubt bacterial cause of pedal lesions.  If you use antibiotics, would change from doxycycline that she was already on. 5.  Severe edema can cause blistering and shallow ulcerations but allergic reaction to some exposure in recent admission possible.   6.  Use lisinopril and furosemide for HTN and edema and tighten diabetic control. 7.  Chronic pigmented lesions of both lower legs may be necrobiosis diabeticorum?

## 2012-01-09 LAB — BASIC METABOLIC PANEL
BUN: 23 mg/dL (ref 6–23)
CO2: 25 mEq/L (ref 19–32)
Calcium: 8.5 mg/dL (ref 8.4–10.5)
Chloride: 102 mEq/L (ref 96–112)
Creatinine, Ser: 0.96 mg/dL (ref 0.50–1.10)
GFR calc Af Amer: 82 mL/min — ABNORMAL LOW (ref >90)
GFR calc non Af Amer: 71 mL/min — ABNORMAL LOW (ref >90)
Glucose, Bld: 207 mg/dL — ABNORMAL HIGH (ref 70–99)
Potassium: 3.8 mEq/L (ref 3.5–5.1)
Sodium: 137 mEq/L (ref 135–145)

## 2012-01-09 LAB — GLUCOSE, CAPILLARY
Glucose-Capillary: 213 mg/dL — ABNORMAL HIGH (ref 70–99)
Glucose-Capillary: 183 mg/dL — ABNORMAL HIGH (ref 70–99)
Glucose-Capillary: 215 mg/dL — ABNORMAL HIGH (ref 70–99)
Glucose-Capillary: 274 mg/dL — ABNORMAL HIGH (ref 70–99)

## 2012-01-09 LAB — DIFFERENTIAL
Basophils Absolute: 0 10*3/uL (ref 0.0–0.1)
Basophils Relative: 0 % (ref 0–1)
Eosinophils Absolute: 0.2 10*3/uL (ref 0.0–0.7)
Eosinophils Relative: 2 % (ref 0–5)
Lymphocytes Relative: 20 % (ref 12–46)
Lymphs Abs: 2 10*3/uL (ref 0.7–4.0)
Monocytes Absolute: 0.6 10*3/uL (ref 0.1–1.0)
Monocytes Relative: 6 % (ref 3–12)
Neutro Abs: 7 10*3/uL (ref 1.7–7.7)
Neutrophils Relative %: 72 % (ref 43–77)

## 2012-01-09 LAB — CBC
HCT: 23.5 % — ABNORMAL LOW (ref 36.0–46.0)
Hemoglobin: 7.3 g/dL — ABNORMAL LOW (ref 12.0–15.0)
MCH: 23.6 pg — ABNORMAL LOW (ref 26.0–34.0)
MCHC: 31.1 g/dL (ref 30.0–36.0)
MCV: 76.1 fL — ABNORMAL LOW (ref 78.0–100.0)
Platelets: 331 10*3/uL (ref 150–400)
RBC: 3.09 MIL/uL — ABNORMAL LOW (ref 3.87–5.11)
RDW: 13.7 % (ref 11.5–15.5)
WBC: 9.8 10*3/uL (ref 4.0–10.5)

## 2012-01-09 LAB — IRON AND TIBC
Iron: 13 ug/dL — ABNORMAL LOW (ref 42–135)
Saturation Ratios: 5 % — ABNORMAL LOW (ref 20–55)
TIBC: 243 ug/dL — ABNORMAL LOW (ref 250–470)
UIBC: 230 ug/dL (ref 125–400)

## 2012-01-09 LAB — VITAMIN B12: Vitamin B-12: 364 pg/mL (ref 211–911)

## 2012-01-09 LAB — MICROALBUMIN / CREATININE URINE RATIO
Creatinine, Urine: 86.8 mg/dL
Microalb Creat Ratio: 803.1 mg/g — ABNORMAL HIGH (ref 0.0–30.0)
Microalb, Ur: 69.71 mg/dL — ABNORMAL HIGH (ref 0.00–1.89)

## 2012-01-09 LAB — RETICULOCYTES
RBC.: 3.17 MIL/uL — ABNORMAL LOW (ref 3.87–5.11)
Retic Count, Absolute: 41.2 10*3/uL (ref 19.0–186.0)
Retic Ct Pct: 1.3 % (ref 0.4–3.1)

## 2012-01-09 LAB — FOLATE: Folate: >20 ng/mL

## 2012-01-09 LAB — FERRITIN: Ferritin: 430 ng/mL — ABNORMAL HIGH (ref 10–291)

## 2012-01-09 NOTE — Progress Notes (Signed)
Subjective:  Patient's swelling has improved significantly as compared to yesterday. She denies pain around her skin sloughing sites. She denies any changes in her urinary habits. She denies fever, chills, nausea, vomiting, chest pain, shortness of breath, diarrhea, constipation. She denies any pain in her right thumb or hand. New dressing around both feet.  Objective: Vital signs in last 24 hours: Filed Vitals:   01/08/12 0541 01/08/12 1407 01/08/12 2221 01/09/12 0531  BP: 135/64 120/61 136/77 153/69  Pulse: 87 90 94 98  Temp: 98.2 F (36.8 C) 97.6 F (36.4 C) 99.2 F (37.3 C) 99 F (37.2 C)  TempSrc: Oral Oral Oral   Resp: 18 18 18 19   Height:      Weight:      SpO2: 100% 97% 98% 100%   Physical Exam: General: alert, well-developed, and cooperative to examination.  Head: normocephalic and atraumatic.  Eyes: vision grossly intact, pupils equal, pupils round, pupils reactive to light, no injection and anicteric.  Mouth: pharynx pink and moist, no erythema, and no exudates.  Neck: supple, full ROM, no thyromegaly, no JVD, and no carotid bruits.  Lungs: normal respiratory effort, no accessory muscle use, normal breath sounds, no crackles, and no wheezes. Heart: normal rate, regular rhythm,  3/6 systolic murmur noted along the left sternal border. Does not radiate to her neck. no gallop, and no rub.  Abdomen: soft, non-tender, normal bowel sounds, no distention, no guarding, no rebound tenderness, no hepatomegaly, and no splenomegaly.  Msk: no joint swelling, no joint warmth, and no redness over joints.  Pulses: 2+ DP/PT pulses bilaterally Extremities: No cyanosis or clubbing. B/L feet and ankle 3+ pitting edema and B/L lower legs 1+ pitting edema. Darkly pigmented regions, with healing ulcerations on legs bilaterally.  B/L feet significant skin breakdown noted with underlying erythema and serosanguinous drainage noted. The lesions are at the medial side of right foot and lateral side  of left foot. Skin breakdown along the distal lateral aspect of right middle finger.  Neurologic: alert & oriented X3, cranial nerves II-XII intact, sensation intact to pinprick normal.  Psych: Oriented X3, memory intact for recent and remote, normally interactive, good eye contact, not anxious appearing, and not depressed appearing.   Lab Results: Na 137, K 3.7, CO2 25, BUN 17, Cr 1.14, Glucose 240 WBCs 9.9, Hbg 7.3, PLTs 369, MCV 75.5 UA: 300+ protein, 4-6 WBCs, 7-10 RBCs Urine dip: moderate hgb Albumin 2.3  Studies/Results: Dg Chest 2 View  01/07/2012  *RADIOLOGY REPORT*  Clinical Data: Increased peripheral edema.  Diabetes.  CHEST - 2 VIEW  Comparison: 08/17/2011  Findings: Increased linear opacity in the posterior left lower lobe may represent atelectasis or scarring.  No evidence of pulmonary air space disease or congestive heart failure.  No evidence of pleural effusion.  Mild cardiomegaly stable.  IMPRESSION:  1.  Mild left lower lobe atelectasis versus scarring. 2.  Stable mild cardiomegaly.  No evidence of congestive heart failure.  Original Report Authenticated By: Marlaine Hind, M.D.   Dg Foot Complete Left  01/07/2012  *RADIOLOGY REPORT*  Clinical Data: Pain and swelling.  Diabetes.  LEFT FOOT - COMPLETE 3+ VIEW  Comparison: None.  Findings: Soft tissue deformity is seen laterally.  There is no evidence of osteomyelitis, fracture, dislocation, degenerative change or other focal lesion.  Incidental plantar calcaneal spur.  IMPRESSION: Soft tissue deformity laterally.  No osseous finding.  Original Report Authenticated By: Jules Schick, M.D.   Dg Foot Complete Right  01/07/2012  *  RADIOLOGY REPORT*  Clinical Data: Swelling.  Diabetes.  RIGHT FOOT COMPLETE - 3+ VIEW  Comparison: None.  Findings: There is no evidence of osteomyelitis.  No fracture or dislocation.  No other focal osseous lesion of significance. Incidental plantar calcaneal spur.  IMPRESSION: No osseous finding.  Original  Report Authenticated By: Jules Schick, M.D.   Medications: I have reviewed the patient's current medications. Scheduled Meds:    .  ceFAZolin (ANCEF) IV  1 g Intravenous Q8H  . collagenase   Topical Daily  . docusate sodium  100 mg Oral BID  . ferrous sulfate  325 mg Oral BID WC  . folic acid  1 mg Oral Daily  . furosemide  20 mg Oral BID  . heparin  5,000 Units Subcutaneous Q8H  . insulin aspart  0-9 Units Subcutaneous TID WC  . insulin aspart protamine-insulin aspart  15 Units Subcutaneous BID WC  . lisinopril  5 mg Oral Daily  . sodium chloride  3 mL Intravenous Q12H  . thiamine  100 mg Oral Daily   Continuous Infusions:  PRN Meds:.sodium chloride, methocarbamol, ondansetron (ZOFRAN) IV, ondansetron, oxyCODONE-acetaminophen, sodium chloride, zolpidem Assessment/Plan:  Mrs. Bier is 46 year old female with PMH of DM II, HTN, ETOH abuse and recent hospitalization of left thumb necrotizing faciitis who presents with skin breakdown along her right foot, proteinuria, and new onset cardiac murmur. She is currently stable.  1. Bilateral feet swelling and significant skin breakdown.    2. DM poorly controlled 3. Bilateral lower extremity swelling with skin breakdown 4. HTN 5. Alcohol abuse 6. VTE  Plan:  Etiology of the patient's blisters and multiple skin remains unclear. Blisters along her lower leg are likely related to swelling. Etiology of swelling unclear. Patient has proteinuria and hypoalbuminemia indicating possible nephrotic syndrome. She has WBCs and  RBCs in her urine potentially indicating interstitial nephritis or nephritic syndrome. Patient's Cr is slowly rising, indicating possible renal pathology. Patient's extensive history of skin breakdown and abscesses could indicate a more systemic etiology. Given the patient's new onset of cardiac murmur, skin breakdown, and renal dysfunction collagen vascular disease or rheumatoid pathology may be possible. We will order ANA  today. And follow urine crt/meicralbumin. 1. Poorly controlled DM and HTN with proteinuria and low serum albumin. Check urine [id number]. She may be nephrotic. LFTs nl and profound malnutrition unlikely so renal cause of edema is likely. Will also order complement panel, esr and CRP. 2. R/O cardiac disease with echo.  3. Ferritin 300 in October 2012. Anemia likely from AOCD. 4. Doubt bacterial cause of pedal lesions.Will d/c doxy and continue ancef for 1 more day until dicussed with Dr. Baxter Flattery. 5. Severe edema can cause blistering and shallow ulcerations but allergic reaction to some exposure in recent admission possible.  6. Use lisinopril and furosemide for HTN and edema as noted by Dr. Stann Mainland. 7. Tighten diabetic control.   Janell Quiet MD [facility] Internal Medicine Resident Pager [phone]

## 2012-01-10 ENCOUNTER — Inpatient Hospital Stay (HOSPITAL_COMMUNITY): Payer: Self-pay

## 2012-01-10 ENCOUNTER — Ambulatory Visit: Payer: Self-pay | Admitting: Internal Medicine

## 2012-01-10 LAB — GLUCOSE, CAPILLARY
Glucose-Capillary: 223 mg/dL — ABNORMAL HIGH (ref 70–99)
Glucose-Capillary: 194 mg/dL — ABNORMAL HIGH (ref 70–99)
Glucose-Capillary: 302 mg/dL — ABNORMAL HIGH (ref 70–99)
Glucose-Capillary: 206 mg/dL — ABNORMAL HIGH (ref 70–99)

## 2012-01-10 LAB — BASIC METABOLIC PANEL
BUN: 21 mg/dL (ref 6–23)
CO2: 25 mEq/L (ref 19–32)
Calcium: 8.7 mg/dL (ref 8.4–10.5)
Chloride: 100 mEq/L (ref 96–112)
Creatinine, Ser: 0.87 mg/dL (ref 0.50–1.10)
GFR calc Af Amer: >90 mL/min (ref >90)
GFR calc non Af Amer: 80 mL/min — ABNORMAL LOW (ref >90)
Glucose, Bld: 234 mg/dL — ABNORMAL HIGH (ref 70–99)
Potassium: 3.9 mEq/L (ref 3.5–5.1)
Sodium: 133 mEq/L — ABNORMAL LOW (ref 135–145)

## 2012-01-10 LAB — DIFFERENTIAL
Basophils Absolute: 0 10*3/uL (ref 0.0–0.1)
Basophils Relative: 0 % (ref 0–1)
Eosinophils Absolute: 0.2 10*3/uL (ref 0.0–0.7)
Eosinophils Relative: 3 % (ref 0–5)
Lymphocytes Relative: 18 % (ref 12–46)
Lymphs Abs: 1.8 10*3/uL (ref 0.7–4.0)
Monocytes Absolute: 0.7 10*3/uL (ref 0.1–1.0)
Monocytes Relative: 7 % (ref 3–12)
Neutro Abs: 7 10*3/uL (ref 1.7–7.7)
Neutrophils Relative %: 72 % (ref 43–77)

## 2012-01-10 LAB — CBC
HCT: 22.2 % — ABNORMAL LOW (ref 36.0–46.0)
Hemoglobin: 7 g/dL — ABNORMAL LOW (ref 12.0–15.0)
MCH: 24.1 pg — ABNORMAL LOW (ref 26.0–34.0)
MCHC: 31.5 g/dL (ref 30.0–36.0)
MCV: 76.3 fL — ABNORMAL LOW (ref 78.0–100.0)
Platelets: 340 10*3/uL (ref 150–400)
RBC: 2.91 MIL/uL — ABNORMAL LOW (ref 3.87–5.11)
RDW: 13.7 % (ref 11.5–15.5)
WBC: 9.8 10*3/uL (ref 4.0–10.5)

## 2012-01-10 LAB — MICROALBUMIN / CREATININE URINE RATIO
Creatinine, Urine: 75.4 mg/dL
Microalb Creat Ratio: 865.1 mg/g — ABNORMAL HIGH (ref 0.0–30.0)
Microalb, Ur: 65.23 mg/dL — ABNORMAL HIGH (ref 0.00–1.89)

## 2012-01-10 LAB — ANTI-SCLERODERMA ANTIBODY: Scleroderma (Scl-70) (ENA) Antibody, IgG: <1 AU/mL (ref <30)

## 2012-01-10 MED ORDER — POLYETHYLENE GLYCOL 3350 17 G PO PACK
17.0000 g | PACK | Freq: Every day | ORAL | Status: DC
  Administered 2012-01-10 – 2012-01-13 (×4): 17 g via ORAL
  Filled 2012-01-10 (×4): qty 1

## 2012-01-10 MED ORDER — INSULIN ASPART PROT & ASPART (70-30 MIX) 100 UNIT/ML ~~LOC~~ SUSP
15.0000 [IU] | Freq: Two times a day (BID) | SUBCUTANEOUS | Status: DC
  Administered 2012-01-10: 18 [IU] via SUBCUTANEOUS
  Administered 2012-01-11: 15 [IU] via SUBCUTANEOUS

## 2012-01-10 NOTE — Progress Notes (Signed)
Clinical Social Work-CSW completed full psychosocial assessment and SBIRT-yellow assessment to follow in shadow chart/SBIRT documented in EPIC. CSW explained reason for visit and pt seemingly very open re: ETOH. Pt relayed that she has a long history of ETOH use/abuse however she states she quit drinking one month ago. CSW and pt  completedf SBIRT and discussed ETOH patterns. Pt seemed to display some insight into connection between ETOH and health. CSW reviewed reccommended interventions based on SBIRT score and discussed coping mechanisms/the lack thereof. Pt lives with son and boyfriend  Who are both continue ETOH use. CSW discussed treatment options including 90 AA meetings in 90 days. Pt initially hesitant to accept any resources however after lengthy discussion which included education on  inpatient programs, CDIOP, AA,  what to expect from [facility] meetings and success rates with AA, pt very open to attending or at lease receiving a meeting schedule. CSW and pt discussed triggers, dangers for relapse and CSW will meet with pt again in the morning to provide ETOH referral packet.Gerre Scull, [phone]

## 2012-01-10 NOTE — Progress Notes (Signed)
Internal Medicine Teaching Service Attending Note Date: 01/10/2012  Patient name: [name]  Medical record number: [id number]  Date of birth: January 02, 1966    This patient has been seen and discussed with the house staff. Please see their note for complete details. I concur with their findings regarding [name]. Will await results of renal ultrasound and 24hr urine protein study in addition to complement studies to help further elucidate etiology of patients protein loss.  Carlyle Basques 01/10/2012, 3:40 PM

## 2012-01-10 NOTE — Consult Note (Addendum)
Reason for Consult:edema, proteinuria in diabetic Referring Physician: Dr. Rochele [name] is a 46 y.o.black woman admitted with worsening edema (bullae on LEs).  She has at least 5 yr. Hx of known DM, but may have had it longer, but didn't have doctor at that time.  She sees docs at [facility].  4+ protein noted on UA with alb 2.3.  BUN 23, Cr 0.87.  Renal consult requested  Past Medical History  Diagnosis Date  . Diabetes mellitus type 2, uncontrolled   . Hypertension   . Anemia   . Alcohol dependency   . Diabetes mellitus     type 2  . Necrotizing fasciitis   Caesarean deliveries + BTL  Family History  Problem Relation Age of Onset  . Diabetes Mother   . Diabetes Father    she has 2 sisters and one brother one of her sisters has diabetes. He has 3 children who are well  Social History: Born and grew up in [place]. She attended [employer] middle [employer] high--she finished ninth grade.She lives with her son and boyfriend. She drinks 2-40 ounce malt liquor per day.  She doesn't smoke cigarettes. She doesn't use illegal l drugs. She works at [employer] on [employer]. She worked up until a month ago  Allergies: No Known Allergies  Medications:  Scheduled: Continuous:     No results found.  ROS- no angina, no claudication, no melena, no hematochezia, no gross hematuria, no renal colic,  no nocturia, no doe, no orthopnea, no purulent sputum, no hemoptysis, recent  She has noted recentweight gain,, no cold or heat intolerance.  She has cyclic predictable menses  Blood pressure 149/85, pulse 88, temperature 98.4 F (36.9 C), temperature source Oral, resp. rate 20, height 5\' 6"  (1.676 m), weight 82.781 kg (182 lb 8 oz), last menstrual period 12/23/2011, SpO2 100.00%. Eyes-PERRL, cannot see R fundus because of cataract.  L fundus has capillary microaneurysms Chest-clear Heart-no rub Abd-nontender, no organs or masses felt Extr-2+ edema with woody skin as wellas  venous stasis changes. Left thumb amputated and right index finger amputated Neuro-R handed, str +, decreased sensation in feet   Renal ultrasound shows right kidney 12 CM, left and 12.3 CM with no hydronephrosis   Assessment/Plan: 1. DM with   A. Glucose intolerance (poor control-elevated A1c)   B. Retinopathy   C. Sensory neuropathy   D. Proteinuria (with history of diabetes, poor control, retinopathy, and neuropathy I suspect proteinuria is from diabetes). 24 urine for protein/creatinine is underway. Check renal ultrasound. I would not recommend renal biopsy at this time. Check SPEP (reflex), UPEP (reflex) to be certain she does not have myeloma (I doubt she has this), check HIV 2. Hypertension-continue lisinopril and diuretics. I would increase diuretics to decrease edema. Continue on ACE inhibitor. Goal BP should be 120/70-130/80 3. Anemia-check iron/TIBC/ferritin. Transfuse when necessary 4. Lesions on legs (? infection)-I would recommend either local wound care or antibiotics (oral) at this time  [name] F 01/10/2012, 7:21 PM

## 2012-01-10 NOTE — Progress Notes (Signed)
Inpatient Diabetes Program Recommendations  AACE/ADA: New Consensus Statement on Inpatient Glycemic Control (2009)  Target Ranges:  Prepandial:   less than 140 mg/dL      Peak postprandial:   less than 180 mg/dL (1-2 hours)      Critically ill patients:  140 - 180 mg/dL   Reason for Visit: Hyperglycemia  Inpatient Diabetes Program Recommendations Insulin - Basal: Current dose of 70/30 is 15 units bid.  Note: Hgb A1c is 12.3. May benefit from increase in 70/30 to 20 units bid to improve glycemic control.    Results for [name], [name] (MRN [id number]) as of 01/10/2012 09:27  Ref. Range 01/09/2012 07:30 01/09/2012 11:46 01/09/2012 16:46 01/09/2012 22:32 01/10/2012 08:15  Glucose-Capillary Latest Range: 70-99 mg/dL 213 (H) 183 (H) 215 (H) 274 (H) 223 (H)

## 2012-01-10 NOTE — Progress Notes (Signed)
Subjective: The patient had no acute events overnight. She says her leg swelling has improved. She denies any pain at her sites of desquamitization. She denies any pain or swelling at the site of her left thumb amputation or middle finger ulceration. She denies any fever, chills, nausea, vomiting, chest pain, shortness of breath, diarrhea. The patient does endorse constipation, with her last bowel movement being 4 days ago.   Objective: Vital signs in last 24 hours: Filed Vitals:   01/09/12 0531 01/09/12 1400 01/09/12 2235 01/10/12 0623  BP: 153/69 126/65 135/71 149/77  Pulse: 98 94 91 84  Temp: 99 F (37.2 C) 99.6 F (37.6 C) 99.8 F (37.7 C) 98.9 F (37.2 C)  TempSrc:  Oral Oral   Resp: 19 20 19 19   Height:      Weight:      SpO2: 100% 99% 99% 100%   Weight change:   Intake/Output Summary (Last 24 hours) at 01/10/12 1322 Last data filed at 01/10/12 1100  Gross per 24 hour  Intake    700 ml  Output   2900 ml  Net  -2200 ml   Physical Exam: General: alert, well-developed, and cooperative to examination.  Head: normocephalic and atraumatic.  Eyes: vision grossly intact, pupils equal, pupils round, pupils reactive to light, no injection and anicteric.  Mouth: pharynx pink and moist, no erythema, and no exudates.  Neck: supple, full ROM, no thyromegaly, no JVD, and no carotid bruits.  Lungs: normal respiratory effort, no accessory muscle use, normal breath sounds, no crackles, and no wheezes. Heart: normal rate, regular rhythm, 3/6 systolic murmur noted along the left sternal border. Does not radiate to her neck. no gallop, and no rub.  Abdomen: soft, non-tender, normal bowel sounds, no distention, no guarding, no rebound tenderness, no hepatomegaly, and no splenomegaly.  MSK: no joint swelling, no joint warmth, and no redness over joints.  Pulses: 2+ DP/PT pulses bilaterally Extremities: No cyanosis or clubbing. B/L feet and ankle 1+ pitting edema. Darkly pigmented regions, with  healing ulcerations on legs bilaterally. B/L feet significant skin breakdown noted with underlying erythema and serosanguinous drainage noted. The lesions are at the medial side of right foot and lateral side of left foot. Skin breakdown along the distal lateral aspect of right middle finger.  Neurologic: Alert & oriented X3, cranial nerves II-XII intact. Psych: Oriented X3, memory intact for recent and remote, normally interactive, good eye contact, not anxious appearing, and not depressed appearing  Medications: I have reviewed the patient's current medications. Scheduled Meds:   . collagenase   Topical Daily  . docusate sodium  100 mg Oral BID  . ferrous sulfate  325 mg Oral BID WC  . folic acid  1 mg Oral Daily  . furosemide  20 mg Oral BID  . heparin  5,000 Units Subcutaneous Q8H  . insulin aspart  0-9 Units Subcutaneous TID WC  . insulin aspart protamine-insulin aspart  15-18 Units Subcutaneous BID WC  . lisinopril  5 mg Oral Daily  . polyethylene glycol  17 g Oral Daily  . sodium chloride  3 mL Intravenous Q12H  . thiamine  100 mg Oral Daily  . DISCONTD:  ceFAZolin (ANCEF) IV  1 g Intravenous Q8H  . DISCONTD: insulin aspart protamine-insulin aspart  15 Units Subcutaneous BID WC   PRN Meds:.sodium chloride, methocarbamol, ondansetron (ZOFRAN) IV, ondansetron, oxyCODONE-acetaminophen, sodium chloride, zolpidem  Assessment/Plan:  [name] is 46 year old female with PMH of DM II, HTN, ETOH abuse and  recent hospitalization of left thumb necrotizing faciitis who presents with skin breakdown along her right foot, proteinuria, and new onset cardiac murmur. She is currently stable.   1. Bilateral feet swelling and significant skin breakdown.  Patient's bilateral lower extremity edema most likely related to renal protein losses. Patient has a 4 year history of diabetes mellitus, with her most current A1C being 12.3. Patient is most likely suffering from diabetic nephropathy. Feet do not  appear to be infected.  -- ANA, Complement and anti-scleroderma antibody pending. -- Nephrology consulted -- Renal ultrasound pending -- 24-hour urine protein pending    2. Cardiac Murmur.  Patient has new onset 3/6 murmur along the left sternal border. Murmur seems consistent with either aortic stenosis or tricuspid regurgitation. 2D echo reveals trivial tricuspid regurgitation. Unlikely that her lower extremity edema is due to cardiac causes.   3. Recent hospitalization of left thumb necrotizing faciitis and I&D along with partial amputation on 1/18 and 1/20.  Left thumb wrapped and appeared to be free of infection. Wound is not red, raised, warm. The wound shows no sign of infection.  -- Discontinue Cefazolin as wounds do not appear to be infected.   4. Proteinuria  Urinanalysis has a spot Urine:Cr of 865.1. Most likely diabetic nephropathy given patient's history of uncontrolled diabetes.  -- 24 hour urine protein -- Lisinopril   5. Anemia One month ago patient's hemoglobin was 11.0 on 12/23/2011. Patient was recently hospitalized for necrotizing fasciitis and discharged on 1/20 with an hgb of 8.0. Currently the patient has a hgb of 7.0 with an MCV of 78. Anemia could be due to anemia of chronic disease, renal dysfunction, or iron deficiency. Will continue to monitor and transfuse if patient's hgb continues to drop or develops symptoms.  -- FOBT pending -- Monitor morning hgb  6. DM, last HbA1C 12.3 on 12/23/11  Patient's diabetes has historically been uncontrolled. Patient's glucose levels are often above 200 in the morning. Will increase her evening 70/30.  -- Monitor CBG  -- SSI  -- Maintain Novolin 70/30 15units in the morning -- Increase Novolin 70/30 to 18units in the evening.  -- Holding Metformin   7. HTN, chronic hx. Patient has a chronic history of htn and was recently switched to lisinopril given proteinuria.  -- Lisinopril 5mg  po daily   8. Alcohol abuse  - CIWA  protocol  - continue folic acid and thiamine   9. Constipation Patient is asymptomatic, but worried that she has not had a bowel movement in 4 days. Patient is taking both Percocet and supplemental iron, which could contribute to constipation. Patient is currently taking colase for constipation. -- Consider decreasing Percocet in future as pain appears well controlled.  -- Added Miralax 17g po daily  9. VTE: Heparin SQ   LOS: 3 days   [name] 01/10/2012, 1:22 PM

## 2012-01-10 NOTE — Progress Notes (Signed)
Utilization review complete 

## 2012-01-11 ENCOUNTER — Inpatient Hospital Stay (HOSPITAL_COMMUNITY): Payer: Self-pay

## 2012-01-11 LAB — CBC
HCT: 23 % — ABNORMAL LOW (ref 36.0–46.0)
Hemoglobin: 7.1 g/dL — ABNORMAL LOW (ref 12.0–15.0)
MCH: 23.3 pg — ABNORMAL LOW (ref 26.0–34.0)
MCHC: 30.9 g/dL (ref 30.0–36.0)
MCV: 75.4 fL — ABNORMAL LOW (ref 78.0–100.0)
Platelets: 390 10*3/uL (ref 150–400)
RBC: 3.05 MIL/uL — ABNORMAL LOW (ref 3.87–5.11)
RDW: 13.6 % (ref 11.5–15.5)
WBC: 9.5 10*3/uL (ref 4.0–10.5)

## 2012-01-11 LAB — BASIC METABOLIC PANEL
BUN: 21 mg/dL (ref 6–23)
CO2: 26 mEq/L (ref 19–32)
Calcium: 8.9 mg/dL (ref 8.4–10.5)
Chloride: 99 mEq/L (ref 96–112)
Creatinine, Ser: 0.84 mg/dL (ref 0.50–1.10)
GFR calc Af Amer: >90 mL/min (ref >90)
GFR calc non Af Amer: 83 mL/min — ABNORMAL LOW (ref >90)
Glucose, Bld: 283 mg/dL — ABNORMAL HIGH (ref 70–99)
Potassium: 4.2 mEq/L (ref 3.5–5.1)
Sodium: 133 mEq/L — ABNORMAL LOW (ref 135–145)

## 2012-01-11 LAB — PROTEIN, URINE, 24 HOUR
Collection Interval-UPROT: 24 hours
Protein, 24H Urine: 2423 mg/d — ABNORMAL HIGH (ref 50–100)
Protein, Urine: 95 mg/dL
Urine Total Volume-UPROT: 2550 mL

## 2012-01-11 LAB — GLUCOSE, CAPILLARY
Glucose-Capillary: 247 mg/dL — ABNORMAL HIGH (ref 70–99)
Glucose-Capillary: 166 mg/dL — ABNORMAL HIGH (ref 70–99)
Glucose-Capillary: 240 mg/dL — ABNORMAL HIGH (ref 70–99)
Glucose-Capillary: 279 mg/dL — ABNORMAL HIGH (ref 70–99)

## 2012-01-11 LAB — COMPLEMENT, TOTAL: Compl, Total (CH50): >60 U/mL — ABNORMAL HIGH (ref 31–60)

## 2012-01-11 MED ORDER — INSULIN ASPART PROT & ASPART (70-30 MIX) 100 UNIT/ML ~~LOC~~ SUSP: 15.0000 [IU] | Freq: Every day | SUBCUTANEOUS | Status: DC

## 2012-01-11 MED ORDER — INSULIN ASPART PROT & ASPART (70-30 MIX) 100 UNIT/ML ~~LOC~~ SUSP
20.0000 [IU] | Freq: Two times a day (BID) | SUBCUTANEOUS | Status: DC
  Administered 2012-01-11 – 2012-01-12 (×2): 20 [IU] via SUBCUTANEOUS

## 2012-01-11 MED ORDER — DEXTROSE 5 % IV SOLN
2.0000 g | INTRAVENOUS | Status: DC
  Administered 2012-01-11 – 2012-01-13 (×3): 2 g via INTRAVENOUS
  Filled 2012-01-11 (×5): qty 2

## 2012-01-11 MED ORDER — CEFTRIAXONE SODIUM 1 G IJ SOLR
1.0000 g | INTRAMUSCULAR | Status: DC
  Filled 2012-01-11: qty 10

## 2012-01-11 MED ORDER — FUROSEMIDE 20 MG PO TABS
20.0000 mg | ORAL_TABLET | Freq: Three times a day (TID) | ORAL | Status: DC
  Administered 2012-01-11 – 2012-01-12 (×3): 20 mg via ORAL
  Filled 2012-01-11 (×5): qty 1

## 2012-01-11 MED ORDER — INSULIN ASPART PROT & ASPART (70-30 MIX) 100 UNIT/ML ~~LOC~~ SUSP
18.0000 [IU] | Freq: Every day | SUBCUTANEOUS | Status: DC
  Filled 2012-01-11: qty 3

## 2012-01-11 MED ORDER — FERUMOXYTOL INJECTION 510 MG/17 ML
510.0000 mg | INTRAVENOUS | Status: DC
  Administered 2012-01-11: 510 mg via INTRAVENOUS
  Filled 2012-01-11: qty 17

## 2012-01-11 MED ORDER — LISINOPRIL 10 MG PO TABS
10.0000 mg | ORAL_TABLET | Freq: Every day | ORAL | Status: DC
  Administered 2012-01-12 – 2012-01-13 (×2): 10 mg via ORAL
  Filled 2012-01-11 (×2): qty 1

## 2012-01-11 NOTE — Progress Notes (Signed)
Received call from resident re this pt and possibility of IV antibiotics via PICC at time of D/C to home, frequency of [facility] and ongoing care of pt PICC. This CM explained that [facility] will accept only if pt and/or friend/family are able to administer IV antibiotics after a few visits and education by [facility]. [facility] will not continue visits and will not provide ongoing PICC care or ongoing adm if IV antibiotics. As the pt is registered as self-pay there would be a cost to the pt for this care and for medication. Upon review of notes, this CM noted recommendation by wound care consult for hydrotherapy to leg wounds. MD please be aware that outpatient hydrotherapy is not available thru [facility] and is not available thru home health. Pt would most likely need to remain inpatient to receive IV antibiotics and hydrotherapy to legs. Please contact case manager for any d/c plans.  Jasmine Pang RN MPH Case Manager [phone]

## 2012-01-11 NOTE — Progress Notes (Signed)
Subjective: Awake, eating lunch, boyfriend at bedside  Objective: Vital signs in last 24 hours: Blood pressure 137/82, pulse 84, temperature 98.2 F (36.8 C), temperature source Oral, resp. rate 18, height 5\' 6"  (1.676 m), weight 82.781 kg (182 lb 8 oz), last menstrual period 12/23/2011, SpO2 97.00%.   Intake/Output from previous day: 02/04 0701 - 02/05 0700 In: 1100 [P.O.:960; I.V.:140] Out: 2450 [Urine:2450] Intake/Output this shift: Total I/O In: 360 [P.O.:360] Out: -  Wt Readings from Last 3 Encounters:  01/07/12 82.781 kg (182 lb 8 oz)  12/23/11 81.6 kg (179 lb 14.3 oz)  12/23/11 81.6 kg (179 lb 14.3 oz)     PHYSICAL EXAM General--alert, courteous Chest--clear Heart--no rub Abd--nontender Extr--amput R index finger and L thumb, still with pedal edema and woody changes to skin onlegs  Lab Results:   Lab 01/11/12 0610 01/10/12 0528 01/09/12 1129  NA 133* 133* 137  K 4.2 3.9 3.8  CL 99 100 102  CO2 26 25 25   BUN 21 21 23   CREATININE 0.84 0.87 0.96  EGFR -- -- --  GLUCOSE 283* -- --  CALCIUM 8.9 8.7 8.5  PHOS -- -- --      Basename 01/11/12 0610 01/10/12 0528  WBC 9.5 9.8  HGB 7.1* 7.0*  HCT 23.0* 22.2*  PLT 390 340     Basename 01/09/12 1630  IRON 13*  TIBC 243*  FERRITIN 430*     No results found for this basename: PTH in the last 72 hours   Scheduled:   . collagenase   Topical Daily  . docusate sodium  100 mg Oral BID  . ferrous sulfate  325 mg Oral BID WC  . folic acid  1 mg Oral Daily  . furosemide  20 mg Oral TID  . heparin  5,000 Units Subcutaneous Q8H  . insulin aspart  0-9 Units Subcutaneous TID WC  . insulin aspart protamine-insulin aspart  15 Units Subcutaneous Q breakfast  . insulin aspart protamine-insulin aspart  18 Units Subcutaneous Q supper  . lisinopril  10 mg Oral Daily  . polyethylene glycol  17 g Oral Daily  . thiamine  100 mg Oral Daily  . DISCONTD: furosemide  20 mg Oral BID  . DISCONTD: insulin aspart  protamine-insulin aspart  15-18 Units Subcutaneous BID WC  . DISCONTD: lisinopril  5 mg Oral Daily  . DISCONTD: sodium chloride  3 mL Intravenous Q12H   Continuous:   Assessment/Plan: 1. DM with     A. Glucose intolerance (poor control-elevated A1c)     B. Retinopathy     C. Sensory neuropathy     D. Proteinuria (with history of diabetes, poor control, retinopathy, and neuropathy  check HIV, SPEP (reflex), and UPEP (reflex) . 24 hr urine for protein and creatinine pending.  2. Hypertension-continue lisinopril and diuretics. I would increase diuretics to decrease edema. Continue on ACE inhibitor. Goal BP should be 120/70-130/80  3. Anemia-Fe/TIBC 13 % and ferritin 430.  She should get Feraheme 510 mg IV  today and again 3 days later. Transfuse when necessary  4. Lesions on legs (? infection)-I would recommend either local wound care (from wound care team) or antibiotics (oral) at this time     LOS: 4 days   Suella Cogar F 01/11/2012,1:34 PM

## 2012-01-11 NOTE — Progress Notes (Signed)
Subjective: S/p left thumb I&D and partial amputation.  Recently admitted for pedal edema and open wounds.  States thumb feels okay.  No pain.  No fevers, chills, night sweats.   Objective: Vital signs in last 24 hours: Temp:  [98 F (36.7 C)-98.4 F (36.9 C)] 98.2 F (36.8 C) (02/05 0512) Pulse Rate:  [84-89] 84  (02/05 0512) Resp:  [18-20] 18  (02/05 0512) BP: (137-149)/(70-85) 137/82 mmHg (02/05 0512) SpO2:  [97 %-100 %] 97 % (02/05 0512)  Intake/Output from previous day: 02/04 0701 - 02/05 0700 In: 1100 [P.O.:960; I.V.:140] Out: 2450 [Urine:2450] Intake/Output this shift: Total I/O In: 360 [P.O.:360] Out: -    Basename 01/11/12 0610 01/10/12 0528 01/09/12 1129  HGB 7.1* 7.0* 7.3*    Basename 01/11/12 0610 01/10/12 0528  WBC 9.5 9.8  RBC 3.05* 2.91*  HCT 23.0* 22.2*  PLT 390 340    Basename 01/11/12 0610 01/10/12 0528  NA 133* 133*  K 4.2 3.9  CL 99 100  CO2 26 25  BUN 21 21  CREATININE 0.84 0.87  GLUCOSE 283* 234*  CALCIUM 8.9 8.7   No results found for this basename: LABPT:2,INR:2 in the last 72 hours  left hand: wound with granulation tissue at edges.  bone exposed without change from last office visit.  fibrinous tissue present.  no erythema.  no streaks.    Assessment/Plan: S/p left thumb I&D and amputation for gas gangrene.  Restart hydrotherapy with packing changes.  Plan for amputation of thumb at level of MP.  This can be performed as outpatient after discharge.     Chadd Tollison R 01/11/2012, 2:00 PM

## 2012-01-11 NOTE — Progress Notes (Addendum)
Subjective:  Patient denies any acute events overnight. She denies any leg, foot, or thumb pain. Patient subjectively feels her edema is feeling better. She says she still has not had a bowel movement. She denies any fever, chills, nausea, vomiting.   Objective: Vital signs in last 24 hours: Filed Vitals:   01/10/12 0623 01/10/12 1450 01/10/12 2209 01/11/12 0512  BP: 149/77 149/85 142/70 137/82  Pulse: 84 88 89 84  Temp: 98.9 F (37.2 C) 98.4 F (36.9 C) 98 F (36.7 C) 98.2 F (36.8 C)  TempSrc:   Oral Oral  Resp: 19 20 18 18   Height:      Weight:      SpO2: 100% 100% 99% 97%    Physical Exam:  General: alert, well-developed, and cooperative to examination.  Head: normocephalic and atraumatic.  Eyes: vision grossly intact, pupils equal, pupils round, pupils reactive to light, no injection and anicteric.  Mouth: pharynx pink and moist, no erythema, and no exudates.  Neck: supple, no thyromegaly, no JVD, and no carotid bruits.  Lungs: normal respiratory effort, no accessory muscle use, normal breath sounds, no crackles, and no wheezes. Heart: normal rate, regular rhythm, 3/6 systolic murmur noted along the left sternal border. no gallop, and no rub.  Abdomen: soft, non-tender, normal bowel sounds, no distention, no guarding, no rebound tenderness, no hepatomegaly, and no splenomegaly.  Pulses: 2+ DP/PT pulses bilaterally Extremities: No cyanosis or clubbing. B/L feet and ankle 3+ pitting edema, increased from yesterday. Darkly pigmented regions, with healing ulcerations on legs bilaterally. B/L feet significant skin breakdown noted. The lesions are at the medial side of right foot and lateral side of left foot. Skin breakdown along the distal lateral aspect of right middle finger. Patient has some swelling, erythema, and pus around the site of her right thumb amputation.   Lab Results: Na 133, K 4.2, Cl 99, HCO 26, BUN 21, Cr 0.84 WBCs 9.5, Hgb 7.1, Plts 390 Studies/Results: US  Renal  01/10/2012  *RADIOLOGY REPORT*  Clinical Data: Proteinuria.  RENAL/URINARY TRACT ULTRASOUND COMPLETE  Comparison:  08/20/2011  Findings:  Right Kidney:  Normal.  12.0 cm in length.  Left Kidney:  Normal.  12.3 cm in length.  Bladder:  Normal.  IMPRESSION: Normal exam, unchanged.  Original Report Authenticated By: Larey Seat, M.D.   Medications: I have reviewed the patient's current medications. Scheduled Meds:   . collagenase   Topical Daily  . docusate sodium  100 mg Oral BID  . ferrous sulfate  325 mg Oral BID WC  . folic acid  1 mg Oral Daily  . furosemide  20 mg Oral TID  . heparin  5,000 Units Subcutaneous Q8H  . insulin aspart  0-9 Units Subcutaneous TID WC  . insulin aspart protamine-insulin aspart  15 Units Subcutaneous Q breakfast  . insulin aspart protamine-insulin aspart  18 Units Subcutaneous Q supper  . lisinopril  10 mg Oral Daily  . polyethylene glycol  17 g Oral Daily  . thiamine  100 mg Oral Daily  . DISCONTD: furosemide  20 mg Oral BID  . DISCONTD: insulin aspart protamine-insulin aspart  15-18 Units Subcutaneous BID WC  . DISCONTD: lisinopril  5 mg Oral Daily  . DISCONTD: sodium chloride  3 mL Intravenous Q12H   Continuous Infusions:  PRN Meds:.methocarbamol, ondansetron (ZOFRAN) IV, ondansetron, oxyCODONE-acetaminophen, zolpidem, DISCONTD: sodium chloride, DISCONTD: sodium chloride Assessment/Plan:  Mrs. Devenney is 46 yo AAF PMH of DM II, HTN, ETOH abuse who presents with bilateral bullae of  her feet secondary to lower extremity edema. She is currently in stable condition, though her lower extremity edema and right thumb look worse today.         Bilateral feet swelling and significant skin breakdown.  Renal protein losses. 4 year history of DM. Most current A1C 12.3 (12/23/2011). Patient is most likely suffering from diabetic nephropathy. Feet do not appear to be infected. Renal ultrasound show the presence of 2 normally sized kidneys.  Her feet have  significant desquamitization, but do not appear infected at this time.  -- Complement, 24-hour urine protein, SPEP, and UPEP pending -- Being followed by nephrology. We appreciate their recommendations.  -- Increased Lasix to 20mg  po tid.   Left thumb necrotizing faciitis (1/18 and 1/20): Wound is raised and slightly red. Pus is released upon pressure. Worry about cellulitis and/or osteomyelitis. Given the patient's history of polymicrobial infections, she will be started on Ceftriaxone.        -- Seen by orthopedics who will perform further amputation of the thumb at the level of the MP as an outpatient. We  appreciate their recommendations.  -- Wound care consulted and following, we appreciate their recommendations.  -- Left hand x-ray pending -- Start Ceftriaxone 2g iv daily   Anemia: Patient's hemoglobin is stable, but low. Her most recent hgb=7.3. Anemia panel has Fe 13, TIBC 243, Sat 5%, Ferritin 430. Patient likely has iron-deficiency anemia or anemia of chronic disease given her history of skin infections.  -- FOBT pending (patient has not had bowel movement) -- Order Feraheme 510mg  Iv today and again on Friday 01/14/2012.   DM: Last A1C (12/23/2011) 12.3. Patient's 24-hour glucose has ranged from 194-302.  -- Monitor CBG -- Increase 70/30 to 20U bid -- Holding Metformin   HTN. chronic hx: BP over 24-hours ranged from 137-149/82-85. Her goal bp is 120-70/130-80.  -- Increase lisinopril to 10mg  daily  Cardiac Murmur: 2D echo has EF 55-60 with trivial TR  Constipation: On FeSO4 and Percocet. On Miralax and Colase. -- Consider decreasing Percocet -- Discontinue oral ferrous sulfate  Alcohol abuse: CIWA, Folic Acid, Thiamine   LOS: 4 days   [name] 01/11/2012, 1:26 PM  This is an addendum:  Subjective: Patient feels about the same. Patient denies any acute events overnight. Patient states that edema in her legs has increased as compared to yesterday but she feels much  better. Patient has not had a bowel movement since in the hospital.\  BP 137/82  Pulse 84  Temp(Src) 98.2 F (36.8 C) (Oral)  Resp 18  Ht 5\' 6"  (1.676 m)  Wt 182 lb 8 oz (82.781 kg)  BMI 29.46 kg/m2  SpO2 97%  LMP 12/23/2011 Gen: Alert oriented x3 in no acute distress Lungs: Clear to auscultation bilaterally with no rhonchi or wheezing Chest: Regular rate and rhythm, no JVD, 3/6 systolic murmur noted at left sternal border, no rubs or gallops, no accessory heart sounds Abdomen: Soft nontender, normal bowel sounds in all 4 quadrants, no guarding or rigidity noted. Extremities: 2+ pitting edema up to mid shin, multiple areas of skin breakdown noted, patient has pus around the site of the right thumb amputation.  Lab results: Reviewed as above  Assessment and plan: Ms. Balbi is a 46 year old female with past medical history of diabetes which is poorly controlled, hypertension, history of alcohol abuse but recently sober, multiple amputation of her digits who was admitted with worsening shortness extremity swelling and discharging lesions in her legs.  #1 bilateral feet  swelling most likely secondary to low albumin #2 multiple skin breakdown in bilateral legs #3 anemia which is likely iron deficiency #4 diabetes type 2 poorly controlled with recent hba1c about 12 #5 hypertension #6 constipation  Plan: We spent quite a bit time and changing patient's dressing on both of her feet and her left thumb. We think that there maybe a component of ostialmyelitis given the clinical information that we have and ESR and CRP being elevated. We started ceftriaxone today to cover for gram-positive and anaerobes based on her previous culture results. We will repeat her and with ferraheme to by Dr. Hassell Done. We have also started the patient on MiraLax and Colace. Patient's CBGs have been high and we'll increase insulin to 20 units twice a day.  Patient will most likely need 6 weeks of antibiotics given her  concern for osteomyelitis. We will coordinate this and discharge planning will depend on getting the patient to follow up with IV antibiotics.  Internal Medicine Teaching Service Attending Note Date: 01/11/2012  Patient name: Elizabethanne Budhu  Medical record number: [id number]  Date of birth: 1965/12/10    This patient has been seen and discussed with the house staff and Dr. Cathren Laine. Please see their note for complete details. I concur with their findings with the following additions/corrections: due to insurance coverage we will discuss with social work how feasible it is to provide IV antibiotics vs. Oral regimen with good penetration. Given the appearance of her amputated finger, there is likely more debridement needed as noted by Dr. Fredna Dow.  Carlyle Basques 01/11/2012, 10:04 PM

## 2012-01-11 NOTE — Consult Note (Signed)
WOC consult Note Reason for Consult: Pt has multiple wounds to BLE.  These were multiple blisters on previous admission which have ruptured and evolved into full thickness wounds in some locations.  Please refer to previous [facility] note on 1/18 for assessment.  Pt could benefit from dermatology consult as these wounds are very atypical in appearance and may be related to some type of vasculitis process. Dr Etheleen Mayhew from hand surgery following for assessment and plan of care to left hand surgical site.  Wound type: Left outer foot with peeling blister 10X4 X.1cm, skin intact over most of ruptured area.  Mod yellow drainage, no odor .  Multiple areas of blistering to left and right calves and feet  Right plantar foot with desquamanation 12X10X.2cm, 70% pink, 30% slough, large amt yellow green drainage, some odor.   Right great toe and 2nd toe space with full thickness wound, 100% red, large amt thick green drainage, some odor, 1X.3X1cm, BONE PALPABLE.  Toe sl purple and swollen.  Blister dk purple below great toe.  Right calf with 1.5X1.5X.1cm, 80% slough, 20% red, small tan drainage,  No odor.  Pressure Ulcer POA: There wounds are not pressure ulcers.  Dressing procedure/placement/frequency: Foam dressing to bilat calf blisters.  Hydrotherapy by physical therapy to assist with removal of nonviable tissue and promote healing.  Santyl for chemical debridement of tissue to right foot and right calf.  Aquacel to absorb drainage to inner toe.  PT COULD BENEFIT FROM XRAY TO R/O OSTEO to right inner toe space since bone is palpable.    Julien Girt, RN, MSN, [employer]  [phone]

## 2012-01-12 DIAGNOSIS — N058 Unspecified nephritic syndrome with other morphologic changes: Secondary | ICD-10-CM

## 2012-01-12 DIAGNOSIS — R809 Proteinuria, unspecified: Secondary | ICD-10-CM

## 2012-01-12 DIAGNOSIS — S8990XA Unspecified injury of unspecified lower leg, initial encounter: Secondary | ICD-10-CM

## 2012-01-12 DIAGNOSIS — D509 Iron deficiency anemia, unspecified: Secondary | ICD-10-CM

## 2012-01-12 DIAGNOSIS — M869 Osteomyelitis, unspecified: Secondary | ICD-10-CM

## 2012-01-12 DIAGNOSIS — E1129 Type 2 diabetes mellitus with other diabetic kidney complication: Principal | ICD-10-CM

## 2012-01-12 LAB — BASIC METABOLIC PANEL
BUN: 21 mg/dL (ref 6–23)
CO2: 25 mEq/L (ref 19–32)
Calcium: 9 mg/dL (ref 8.4–10.5)
Chloride: 101 mEq/L (ref 96–112)
Creatinine, Ser: 0.86 mg/dL (ref 0.50–1.10)
GFR calc Af Amer: >90 mL/min (ref >90)
GFR calc non Af Amer: 81 mL/min — ABNORMAL LOW (ref >90)
Glucose, Bld: 202 mg/dL — ABNORMAL HIGH (ref 70–99)
Potassium: 4.3 mEq/L (ref 3.5–5.1)
Sodium: 137 mEq/L (ref 135–145)

## 2012-01-12 LAB — HIV ANTIBODY (ROUTINE TESTING W REFLEX): HIV: NONREACTIVE

## 2012-01-12 LAB — GLUCOSE, CAPILLARY
Glucose-Capillary: 198 mg/dL — ABNORMAL HIGH (ref 70–99)
Glucose-Capillary: 165 mg/dL — ABNORMAL HIGH (ref 70–99)
Glucose-Capillary: 219 mg/dL — ABNORMAL HIGH (ref 70–99)
Glucose-Capillary: 105 mg/dL — ABNORMAL HIGH (ref 70–99)

## 2012-01-12 MED ORDER — FUROSEMIDE 40 MG PO TABS
40.0000 mg | ORAL_TABLET | Freq: Two times a day (BID) | ORAL | Status: DC
  Administered 2012-01-12 – 2012-01-13 (×2): 40 mg via ORAL
  Filled 2012-01-12 (×4): qty 1

## 2012-01-12 MED ORDER — INSULIN ASPART PROT & ASPART (70-30 MIX) 100 UNIT/ML ~~LOC~~ SUSP
23.0000 [IU] | Freq: Two times a day (BID) | SUBCUTANEOUS | Status: DC
  Administered 2012-01-12 – 2012-01-13 (×2): 23 [IU] via SUBCUTANEOUS

## 2012-01-12 NOTE — Progress Notes (Signed)
Spoke with Dr Baxter Flattery re [facility] needs for this pt, plan is to try to d/c to home with IV antibiotics. This CM called [facility] and they will see pt to determine if she has adequate assistance in the home to administer IV antibiotics as pt most likely will not be able to perform this task due to thumb surgical site (setup and administration of IV antibiotic requires two hands) Await [facility] recommendation, also spoke with finance. Finance has seen pt and at this time do not believe that she will qualify for Disability or Medicaid, as we are unable to clearly support a statement that pt will be disabled for the next year. Also spoke with pt accounting re possibility of indigent status for this pt. Pt will need to complete application and provide supporting information required. Pt has application, will meet with pt today and encourage her to complete this process and sent to pt accounting as soon as possible as she may be determined to be indigent and hospital cost would be forgiven or adjusted based on pt income.  Jasmine Pang RN MPH Case Manager [phone]

## 2012-01-12 NOTE — Progress Notes (Signed)
PT Hydrotherapy Evaluation  Hydrotherapy Evaluation   01/12/2012 Time In: 0940  Time Out: [id number]   Pt adm 01/07/2012 with bil foot edema with blistering and opening of lesions.  Past Medical History: necrotizing fasciitis of lt thumb with partial amputation, DM, HTN   Evaluation and treatment procedures explained to pt. Pt agreed to evaluation and treatment.  Date of Onset: Foot wounds 3-4 days ago, thumb wound > 72month Pain:  Pt denied pain during treatment. Sensation: WFL per pt report. Drainage:  Moderate yellow (feet), moderate yellow (thumb) Edema: Present (feet and thumb) Erythema: Absent Odor: None   Wound #1 Thumb  Length: 8 cm Width:  1 cm Depth:   1+cm Undermining:    Granulation:  50%  Slough:  25%   Bone:  25%  Wound #2 Right plantar foot with desquamanation   Length: 12 cm Width:  10 cm Depth: .2 cm Undermining:  Pink: 70% Slough: 30%   Wound #3 Right great toe and 2nd toe space with full thickness wound  Length: 1 cm Width:  .5 cm Depth: 1 cm Undermining:  Granulation: 100% Slough: % Eschar:  %   Wound #4 Left lateral foot with peeling blister    Length: 10 cm Width:  4 cm Depth:  .2 cm Undermining:    Pink:  70% Slough:  30% Eschar:  %  Treatment:  Pulsatile lavage with suction at 4-8 psi x 1078ml NS with splash shield diverter tip to bil feet.           Pulsatile lavage with suction at 4 psi X 66ml NS with splash shield diverter tip to lt thumb Dressing:  NS 4x4's packed, dry 4x4's, guaze wrap, tape to lt thumb: aquacel between 1st and 2nd toes on rt  foot: Santyl, NS 4x4's, dry 4x4's, guaze wrap bil feet.  Assessment/Recommendations:  Continue hydrotherapy to remove slough and promote healing.  Goals: (Frequency/Duration 6x/wk for 1 wk) (Potential good)  Improve the function of pt's integumentary system by progressing the wounds through the phases of wound healing (inflammation - proliferation - remodeling) by:  #1 Decrease  slough to 40% #2 Increase granulation to 60%  #3 Pt will be able to verbalize understanding of dressing changes for home.  Goals and treatment plan made and agreed with upon by pt Follow/up recommendations:  Home health (feet)/ MD office (lt. Thumb)  Hydrotherapy Plan:  Pulsatile Lavage with suction, selective debridement, dressing change, pt/family education.   [employer] PT [phone]

## 2012-01-12 NOTE — Progress Notes (Signed)
Subjective: Awake, alert, watching TV.  Says she's been to [facility]  Objective: Vital signs in last 24 hours: Blood pressure 140/84, pulse 82, temperature 98.7 F (37.1 C), temperature source Oral, resp. rate 20, height 5\' 6"  (1.676 m), weight 82.781 kg (182 lb 8 oz), last menstrual period 12/23/2011, SpO2 98.00%.   Intake/Output from previous day: 02/05 0701 - 02/06 0700 In: 960 [P.O.:960] Out: 1550 [Urine:1550] Intake/Output this shift:   Wt Readings from Last 3 Encounters:  01/07/12 82.781 kg (182 lb 8 oz)  12/23/11 81.6 kg (179 lb 14.3 oz)  12/23/11 81.6 kg (179 lb 14.3 oz)     PHYSICAL EXAM General--awake,alert, courteous  Chest--clear  Heart--no rub  Abd--nontender  Extr--amput R index finger and L thumb, still with pedal edema and woody changes to skin     Lab Results:   Lab 01/12/12 0650 01/11/12 0610 01/10/12 0528  NA 137 133* 133*  K 4.3 4.2 3.9  CL 101 99 100  CO2 25 26 25   BUN 21 21 21   CREATININE 0.86 0.84 0.87  EGFR -- -- --  GLUCOSE 202* -- --  CALCIUM 9.0 8.9 8.7  PHOS -- -- --    24 hr urine shows 2.5 g proteinuria/24 hr  Basename 01/11/12 0610 01/10/12 0528  WBC 9.5 9.8  HGB 7.1* 7.0*  HCT 23.0* 22.2*  PLT 390 340     Basename 01/09/12 1630  IRON 13*  TIBC 243*  FERRITIN 430*   5% transferrin sat  No results found for this basename: PTH in the last 72 hours     Assessment/Plan: 1. DM with     A. Glucose intolerance (poor control-elevated A1c)     B. Retinopathy--she should see Ophthalmologist to confirm and treat retinopathy once out of hospital     C. Sensory neuropathy     D. Proteinuria (with history of diabetes, poor control, retinopathy, and neuropathy check HIV, SPEP (reflex), and UPEP (reflex) . 24 hr urine for protein=2.5 g /24 hr.  2. Hypertension-continue lisinopril and diuretics. I would increase diuretics to decrease edema. Continue on ACE inhibitor. Goal BP should be 120/70-130/80  3.  Anemia-Fe/TIBC 13 % and ferritin 430. She should get Feraheme 510 mg IV today and again 3 days later. Transfuse when necessary  4. Lesions on legs (? infection)-  wound care team has seen and left recommendations.  I have little more to add other than suggestions left yesterday and today.  Call if renal help needed.    LOS: 5 days   Berdine Rasmusson F 01/12/2012,12:36 PM

## 2012-01-12 NOTE — Progress Notes (Signed)
Subjective:  No acute events overnight. Patient is awake, alert, and pleasant. She denies any pain in her hands or legs. She denies any fever or chills  Objective: Vital signs in last 24 hours: Filed Vitals:   01/11/12 0512 01/11/12 1645 01/11/12 2128 01/12/12 0603  BP: 137/82 134/65 117/56 140/84  Pulse: 84 93 89 82  Temp: 98.2 F (36.8 C) 99.9 F (37.7 C) 98.8 F (37.1 C) 98.7 F (37.1 C)  TempSrc: Oral  Oral Oral  Resp: 18 20 20 20   Height:      Weight:      SpO2: 97% 98% 96% 98%   Weight change:   Intake/Output Summary (Last 24 hours) at 01/12/12 1502 Last data filed at 01/12/12 0604  Gross per 24 hour  Intake    240 ml  Output    750 ml  Net   -510 ml   Physical Exam:  General: alert, well-developed, and cooperative to examination.  Head: normocephalic and atraumatic.  Eyes: vision grossly intact, pupils equal, pupils round, pupils reactive to light, no injection and anicteric.  Mouth: pharynx pink and moist, no erythema, and no exudates.  Neck: supple, no thyromegaly, no JVD, and no carotid bruits.  Lungs: normal respiratory effort, no accessory muscle use, normal breath sounds, no crackles, and no wheezes. Heart: normal rate, regular rhythm, 2/6 systolic murmur noted along the left sternal border. no gallop, and no rub.  Abdomen: soft, non-tender, normal bowel sounds, no distention, no guarding, no rebound tenderness, no hepatomegaly, and no splenomegaly.  Pulses: 2+ DP/PT pulses bilaterally Extremities: No cyanosis or clubbing. B/L feet and ankle 2+ pitting edema, increased from yesterday. Darkly pigmented regions, with healing ulcerations on legs bilaterally. B/L feet significant skin breakdown noted. The lesions are at the medial side of right foot and lateral side of left foot. Skin breakdown along the distal lateral aspect of right middle finger. Patient has some swelling, erythema, and pus around the site of her right thumb amputation.   Lab Results:  Na 137,  K 4.3, Cl 101, CO2 25, BUN 21, Cr 0.86, Ca 9.0. 24-hour total protein 2423mg   Studies/Results: Dg Hand 2 View Left  01/11/2012  *RADIOLOGY REPORT*   IMPRESSION:  1.  Band of lucency along the webspace of the thumb likely relates to a tissue flap and does not have the infiltrative gas appearance of the prior fasciitis.  Correlate with physical inspection to exclude ulceration. 2.  Amputation of the thumb at the level of the base of the proximal phalanx. 3.  Remote amputation of the distal phalanx of the third finger.  Original Report Authenticated By: Carron Curie, M.D.   Medications: I have reviewed the patient's current medications. Scheduled Meds:   . cefTRIAXone (ROCEPHIN)  IV  2 g Intravenous Q24H  . collagenase   Topical Daily  . docusate sodium  100 mg Oral BID  . ferumoxytol  510 mg Intravenous Q3 days  . folic acid  1 mg Oral Daily  . furosemide  20 mg Oral TID  . heparin  5,000 Units Subcutaneous Q8H  . insulin aspart  0-9 Units Subcutaneous TID WC  . insulin aspart protamine-insulin aspart  20 Units Subcutaneous BID WC  . lisinopril  10 mg Oral Daily  . polyethylene glycol  17 g Oral Daily  . thiamine  100 mg Oral Daily   Continuous Infusions:  PRN Meds:.methocarbamol, ondansetron (ZOFRAN) IV, ondansetron, oxyCODONE-acetaminophen, zolpidem Assessment/Plan:  [name] is 46 yo AAF PMH of DM  II, HTN, ETOH abuse who presents with bilateral bullae of her feet secondary to lower extremity edema. She is currently in stable condition, her wounds generally look improved today.  Bilateral feet swelling and significant skin breakdown.  Renal protein losses of 2.4G per day. 4 year history of DM. Most current A1C 12.3 (12/23/2011). Patient is most likely suffering from diabetic nephropathy. Feet do not appear to be infected. RHer feet have significant desquamitization, but do not appear infected at this time. She is currently receiving hydrotherapy for her feet and shins  bilaterally. LE edema persists, will increase lasix dose.  -- Complement, HIV, 24-hour urine protein, SPEP, and UPEP pending  -- Being followed by nephrology. We appreciate their recommendations.  -- Increased Lasix to 40mg  po bid.    Left thumb necrotizing faciitis: Wound is raised and slightly red. X-ray does not reveal any gangrenous process. Given the patient's history of polymicrobial infections, she been started on Ceftriaxone.  -- Seen by orthopedics who will perform further amputation of the thumb at the level of the MP as an outpatient. We appreciate their recommendations.  -- Wound care consulted and following, we appreciate their recommendations.  -- Start Ceftriaxone 2g iv daily   Anemia: Patient's hemoglobin is stable, but low. Her most recent hgb=7.3. Anemia panel has Fe 13, TIBC 243, Sat 5%, Ferritin 430. Patient likely has iron-deficiency anemia or anemia of chronic disease given her history of skin infections.  -- FOBT pending (patient has not had bowel movement)  -- Order 2nd dose of Feraheme 510mg  Iv on Friday 01/14/2012.   DM: Last A1C (12/23/2011) 12.3. Patient's 24-hour glucose has ranged from 166-247.   -- Monitor CBG  -- Refer to ophthalmologist as outpatient -- Increase 70/30 to 23U bid  -- Holding Metformin   HTN. chronic hx: BP over 24-hours ranged from 137-149/82-85. Her goal bp is 120-70/130-80.  -- Increase lisinopril to 10mg  daily   Cardiac Murmur: 2D echo has EF 55-60 with trivial TR   Constipation: On FeSO4 and Percocet. On Miralax and Colase.  -- Consider decreasing Percocet  -- Discontinue oral ferrous sulfate   Alcohol abuse: CIWA, Folic Acid, Thiamine    LOS: 5 days   [name] 01/12/2012, 3:02 PM  Internal Medicine Teaching Service Attending Note Date: 01/12/2012  Patient name: [name]  Medical record number: [id number]  Date of birth: [birth year]    This patient has been seen and discussed with the housestaff. Please see their  note for complete details. I concur with their findings. Please see my separate note for today's findings & assessment and plan.  Carlyle Basques 01/12/2012, 4:14 PM

## 2012-01-12 NOTE — Progress Notes (Signed)
GENERAL MEDICINE ATTENDING PROGRESS NOTE  (this note is accompanying daily note by [name], MSIV)  ID: [name] is a 46 y.o. female with poorly controlled diabetes, hypoalbuminemia, proteinuria, and recent history of necrotizing fasciitis s/p thumb amputation to left hand.  Subjective: Afebrile, underwent wound care and hydrotherapy this morning without discomfort. She is eating well. No fever/chills/nightsweats. She received her 1st dose of iron infusion yesterday without difficulty.  Abtx: Ceftriaxone  Medications:     . cefTRIAXone (ROCEPHIN)  IV  2 g Intravenous Q24H  . collagenase   Topical Daily  . docusate sodium  100 mg Oral BID  . ferumoxytol  510 mg Intravenous Q3 days  . folic acid  1 mg Oral Daily  . furosemide  40 mg Oral BID  . heparin  5,000 Units Subcutaneous Q8H  . insulin aspart  0-9 Units Subcutaneous TID WC  . insulin aspart protamine-insulin aspart  23 Units Subcutaneous BID WC  . lisinopril  10 mg Oral Daily  . polyethylene glycol  17 g Oral Daily  . thiamine  100 mg Oral Daily  . DISCONTD: furosemide  20 mg Oral TID  . DISCONTD: insulin aspart protamine-insulin aspart  20 Units Subcutaneous BID WC    Objective: Vital signs in last 24 hours: Temp:  [98.7 F (37.1 C)-99.9 F (37.7 C)] 98.7 F (37.1 C) (02/06 0603) Pulse Rate:  [82-93] 82  (02/06 0603) Resp:  [20] 20  (02/06 0603) BP: (117-140)/(56-84) 140/84 mmHg (02/06 0603) SpO2:  [96 %-98 %] 98 % (02/06 0603)  General: alert, well-developed, and cooperative to examination.  Head: normocephalic and atraumatic.  Eyes: vision grossly intact, pupils equal, pupils round, pupils reactive to light, no injection and anicteric.  Mouth: pharynx pink and moist, no erythema, and no exudates.  Neck: supple, no thyromegaly, no JVD, and no carotid bruits.  Lungs: normal respiratory effort, no accessory muscle use, normal breath sounds, no crackles, and no wheezes. Heart: normal rate, regular  rhythm, 2/6 systolic murmur noted along the left sternal border. no gallop, and no rub.  Abdomen: soft, non-tender, normal bowel sounds, no distention, no guarding, no rebound tenderness, no hepatomegaly, and no splenomegaly.  Pulses: 2+ DP/PT pulses bilaterally Extremities: + B/L feet and ankle 2+ pitting edema, increased from yesterday. Hyper- pigmented lesions, with healing ulcerations on legs bilaterally on anterior shins, covered by foam dressing. B/L feet has desquamation of soles of feet. The lesions are at the medial side of right foot and lateral side of left foot. Skin breakdown along the distal lateral aspect of right middle finger. Patient has some mild swelling, erythema, and purulent exudate by exposed bone around the site of her right thumb amputation.     Lab Results  Basename 01/12/12 0650 01/11/12 0610 01/10/12 0528  WBC -- 9.5 9.8  HGB -- 7.1* 7.0*  HCT -- 23.0* 22.2*  NA 137 133* --  K 4.3 4.2 --  CL 101 99 --  CO2 25 26 --  BUN 21 21 --  CREATININE 0.86 0.84 --  GLU -- -- --   Sedrate: Lab Results  Component Value Date   ESRSEDRATE 123* 01/08/2012   Lab Results  Component Value Date   CRP 8.51* 01/08/2012   Studies/Results: Dg Hand 2 View Left  01/11/2012  *RADIOLOGY REPORT*  Clinical Data: Amputation of the thumb.  LEFT HAND - 2 VIEW  Comparison: 12/23/2011  Findings: Amputation of the left thumb at the level of the base of the proximal phalanx noted,  with overlying bandaging.  Adjacent soft tissue swelling noted. A band of lucency along the webspace the thumb is observed, possibly from ulceration or skin adult, or less likely extension of fasciitis.  This does not have the infiltrative appearance of the prior fasciitis.  Correlate with physical inspection.  Prior amputation of the distal phalanx of the third finger noted.  IMPRESSION:  1.  Band of lucency along the webspace of the thumb likely relates to a tissue flap and does not have the infiltrative gas appearance  of the prior fasciitis.  Correlate with physical inspection to exclude ulceration. 2.  Amputation of the thumb at the level of the base of the proximal phalanx. 3.  Remote amputation of the distal phalanx of the third finger.  Original Report Authenticated By: Carron Curie, M.D.   Assessment/Plan: 1) left thumb osteomyelitis =  Patient had hand xray yesterday that did not show any further infiltrative gas. will have patient assessed by [facility] to whether she will be able to receive ceftriaxone 2gm IV daily for 6 wks given financial limitiations and lack of insurance. Will get picc line if home health is to be arranged. Dr. Fredna Dow to see patient on Friday to get wound care and assess for further i x d vs. More proximal amputation. Local wound care includes hydrotherapy and saline dressing to left thumb. Today wound looks unchanged fibrinous exudate in both the dorsal and ventral aspect of hand at base of thumb. Exposed bone does not appear to be coated with exudate as it did yesterday.  2) lower extremity wounds/ desquamation of soles of feet bilaterally = wound care assess and treating patient daily. Currently getting collagenase on soles of feet and aquacel in between affected toes. Wound care recommended skin biopsy of lesions to shin to evaluate for vasculitis. We will see if can be coordinated as outpatient to see dermatology.  3)  Diabetes mellitus, poorly controlled c/b  retinopathy, neuropathy and proteinuira =  We appreciate nephrology recommendations. Currently we will adjust her insulin dosage in order to get better glucose control. Consider to maximize ace-inhibitor after we have increased lasix to 40mg  BID to manage edema.  4) proteinuria = will maximize ace-inhibitor in 1-2 days after we see how she handles the current increase of lasix 40mg  BID. Will also follow up on additional tests to rule out other cause of protein-nephropathy such as SPEP, UPEP, and HIV.  5)  iron-deficiency anemia= patient has received 1 dose of IV iron, and will get 2nd dose on day 3. Anticipate she will tolerate without difficulty since she did fine with 1st dose.  Anelly Samarin Infectious Diseases 01/12/2012, 3:38 PM

## 2012-01-13 LAB — UIFE/LIGHT CHAINS/TP QN, 24-HR UR
Albumin, U: DETECTED
Alpha 1, Urine: DETECTED — AB
Alpha 2, Urine: DETECTED — AB
Beta, Urine: DETECTED — AB
Free Kappa Lt Chains,Ur: 18.2 mg/dL — ABNORMAL HIGH (ref 0.14–2.42)
Free Kappa/Lambda Ratio: 6.11 ratio (ref 2.04–10.37)
Free Lambda Lt Chains,Ur: 2.98 mg/dL — ABNORMAL HIGH (ref 0.02–0.67)
Gamma Globulin, Urine: DETECTED — AB
Total Protein, Urine: 99.3 mg/dL

## 2012-01-13 LAB — BASIC METABOLIC PANEL
BUN: 21 mg/dL (ref 6–23)
CO2: 27 mEq/L (ref 19–32)
Calcium: 9.6 mg/dL (ref 8.4–10.5)
Chloride: 98 mEq/L (ref 96–112)
Creatinine, Ser: 0.86 mg/dL (ref 0.50–1.10)
GFR calc Af Amer: >90 mL/min (ref >90)
GFR calc non Af Amer: 81 mL/min — ABNORMAL LOW (ref >90)
Glucose, Bld: 175 mg/dL — ABNORMAL HIGH (ref 70–99)
Potassium: 4.7 mEq/L (ref 3.5–5.1)
Sodium: 135 mEq/L (ref 135–145)

## 2012-01-13 LAB — PROTEIN ELECTROPHORESIS, SERUM
Albumin ELP: 34.7 % — ABNORMAL LOW (ref 55.8–66.1)
Alpha-1-Globulin: 8.3 % — ABNORMAL HIGH (ref 2.9–4.9)
Alpha-2-Globulin: 21.6 % — ABNORMAL HIGH (ref 7.1–11.8)
Beta 2: 7.2 % — ABNORMAL HIGH (ref 3.2–6.5)
Beta Globulin: 5.5 % (ref 4.7–7.2)
Gamma Globulin: 22.7 % — ABNORMAL HIGH (ref 11.1–18.8)
M-Spike, %: NOT DETECTED g/dL
Total Protein ELP: 6.9 g/dL (ref 6.0–8.3)

## 2012-01-13 LAB — GLUCOSE, CAPILLARY
Glucose-Capillary: 190 mg/dL — ABNORMAL HIGH (ref 70–99)
Glucose-Capillary: 175 mg/dL — ABNORMAL HIGH (ref 70–99)

## 2012-01-13 MED ORDER — FUROSEMIDE 40 MG PO TABS: 40.0000 mg | ORAL_TABLET | Freq: Two times a day (BID) | ORAL | Status: DC

## 2012-01-13 MED ORDER — INSULIN NPH ISOPHANE & REGULAR (70-30) 100 UNIT/ML ~~LOC~~ SUSP: SUBCUTANEOUS | Status: DC

## 2012-01-13 MED ORDER — OXYCODONE-ACETAMINOPHEN 5-325 MG PO TABS: 1.0000 | ORAL_TABLET | ORAL | Status: AC | PRN

## 2012-01-13 MED ORDER — HEPARIN SOD (PORK) LOCK FLUSH 100 UNIT/ML IV SOLN
250.0000 [IU] | INTRAVENOUS | Status: AC | PRN
  Administered 2012-01-13: 250 [IU]

## 2012-01-13 MED ORDER — LISINOPRIL 10 MG PO TABS: 10.0000 mg | ORAL_TABLET | Freq: Every day | ORAL | Status: DC

## 2012-01-13 MED ORDER — COLLAGENASE 250 UNIT/GM EX OINT: TOPICAL_OINTMENT | Freq: Every day | CUTANEOUS | Status: AC

## 2012-01-13 MED ORDER — DEXTROSE 5 % IV SOLN: 2.0000 g | INTRAVENOUS | Status: DC

## 2012-01-13 MED ORDER — COLLAGENASE 250 UNIT/GM EX OINT
TOPICAL_OINTMENT | Freq: Every day | CUTANEOUS | Status: DC
  Filled 2012-01-13: qty 30

## 2012-01-13 MED ORDER — SODIUM CHLORIDE 0.9 % IJ SOLN
10.0000 mL | INTRAMUSCULAR | Status: DC | PRN
  Administered 2012-01-13: 10 mL

## 2012-01-13 NOTE — Consult Note (Signed)
WOC follow-up consult Note Pt getting hydrotherapy by physical therapy to assist with removal of nonviable tissue to multiple sites.  This is not available as outpatient.  If pt plans to d/c soon, RECOMMEND FOLLOW-UP BY OUTPATIENT WOUND CENTER to continue to treat bilat foot wounds.   Remains with several areas of tightly adhered slough, significant amt peeling skin has been removed from outer wound beds.  Left outer foot with slough 1X3cm, right calf improving; 1.5X1.5cm  50% slough, 50% red right instep of foot 4X10 cm 100% slough, right posterior ankle 1.5X.5cm, 100% slough. Dr Judeth Cornfield following for assessment and plan of care to left thumb. Right inner foot between great toe and 2nd toe continues to have significant skin desquamination.  Open area has evolved into 3X5X.2cm full thickness wound as previous blister continues to peel.  Remains with several dry intact blisters to plantar feet BLE which have not begun to peel.  Follow-up at outpatient wound center wound be optimal plan of care.  This is obtained by physician referral.  Recommend home health for dressing change assistance.   Continue Santyl to slough areas, and Aquacel to provide antimicrobial benefits and absorb drainage to right inner toe space wound. Will not plan to follow further unless re-consulted.  [address], [place], MSN, [place]

## 2012-01-13 NOTE — Progress Notes (Signed)
Subjective: No acute event overnight. Patient is sitting up and eating breakfast. PICC line team was about to place PICC in the morning. She denies any pain in her hand and feet. She denies any symptoms diaphoresis or light-headedness. She denies any fever, chills, nausea, vomiting, chest pain, or shortness of breath.  Objective: Vital signs in last 24 hours: Filed Vitals:   01/12/12 0603 01/12/12 1420 01/12/12 2101 01/13/12 0537  BP: 140/84 145/80 138/82 119/75  Pulse: 82 81 84 84  Temp: 98.7 F (37.1 C) 97.8 F (36.6 C) 98 F (36.7 C) 97.8 F (36.6 C)  TempSrc: Oral Oral Oral Oral  Resp: 20 18 18 18   Height:      Weight:      SpO2: 98% 97% 100% 94%   Weight change:   Intake/Output Summary (Last 24 hours) at 01/13/12 0841 Last data filed at 01/13/12 0537  Gross per 24 hour  Intake   1560 ml  Output   2350 ml  Net   -790 ml   Physical Exam:  General: alert, well-developed, and cooperative to examination.  Head: normocephalic and atraumatic.  Eyes: vision grossly intact, pupils equal, pupils round, pupils reactive to light, no injection and anicteric.  Mouth: pharynx pink and moist, no erythema, and no exudates.  Neck: supple, no thyromegaly, no JVD. Lungs: normal respiratory effort, no accessory muscle use, normal breath sounds, no crackles, and no wheezes. Heart: normal rate, regular rhythm, 2/6 systolic murmur noted along the left sternal border. no gallop, and no rub.  Abdomen: soft, non-tender, normal bowel sounds, no distention, no guarding, no rebound tenderness. Pulses: 2+ DP/PT pulses bilaterally Extremities: 2+ pitting edema feet bilaterally. Hyper- pigmented lesions, with healing ulcerations on legs bilaterally on anterior shins, covered by foam dressing. B/L feet has desquamation of soles of feet. The lesions are at the medial side of right foot and lateral side of left foot. Skin breakdown along the distal lateral aspect of right middle finger.   Lab Results: Na  135, K 4.7, Cl 98, CO2 27, BUN 21, Cr 0.86, Anti-Scl negative, HIV non-reactive. Gram Stain Gram positive pair and rods. Gram negative rods. Cultures pending but initially positive for gram negative rods.   Medications: I have reviewed the patient's current medications. Scheduled Meds:   . cefTRIAXone (ROCEPHIN)  IV  2 g Intravenous Q24H  . collagenase   Topical Daily  . docusate sodium  100 mg Oral BID  . ferumoxytol  510 mg Intravenous Q3 days  . folic acid  1 mg Oral Daily  . furosemide  40 mg Oral BID  . heparin  5,000 Units Subcutaneous Q8H  . insulin aspart  0-9 Units Subcutaneous TID WC  . insulin aspart protamine-insulin aspart  23 Units Subcutaneous BID WC  . lisinopril  10 mg Oral Daily  . polyethylene glycol  17 g Oral Daily  . thiamine  100 mg Oral Daily  . DISCONTD: furosemide  20 mg Oral TID  . DISCONTD: insulin aspart protamine-insulin aspart  20 Units Subcutaneous BID WC   Continuous Infusions:  PRN Meds:.methocarbamol, ondansetron (ZOFRAN) IV, ondansetron, oxyCODONE-acetaminophen, zolpidem Assessment/Plan:  [name] is a 46 yo female with a PMH of DMII, HTN who is recovering from ruptured LE bullae secondary to LE edema. She is currently stable and medically safe for outpatient management.   1) Left thumb osteomyelitis = X-ray on 01/11/2012 did not show any further infiltrative gas. Patient has been accepted by [facility] home [facility] to receive ceftriaxone 2gm  IV daily for 6 wks (until March 19). She received her PICC line today. Dr. Fredna Dow to see patient on Friday to get wound care and assess for further i x d vs. More proximal amputation is planned. Local wound care includes hydrotherapy and saline dressing to left thumb. Today wound looks unchanged fibrinous exudate in both the dorsal and ventral aspect of hand at base of thumb. Exposed bone is no longer exposed with exudate. Patient received 2G of Ceftriaxone prior to discharge.   2) Lower extremity wounds/  desquamation of soles of feet bilaterally = Wound care managing daily. Currently getting collagenase on soles of feet and aquacel in between affected toes. Wound care recommended skin biopsy of lesions to shin to evaluate for vasculitis which will be coordinated as an outpatient. Wounds do not appear infected.   3) Diabetes mellitus, poorly controlled c/b retinopathy, neuropathy and proteinuira = We appreciate nephrology recommendations. Her insulin regimen has been progressively increased, but will need further managed as an outpatient. Her fasting glucose was 190 after 23U of 70/30 the evening before. We will restart her home metformin upon discharge. Lasix is currently at 40mg  po twice a day for edema. Lisinopril is at 10mg  po daily. Her blood pressure this morning was 119/74. We will not increase her lisinopril more at this time.   4) Proteinuria = Will continu2e to maximize lisinopril dose as an outpatient. New lab tests show patient is non-reactive for HIV. Will also follow up on additional tests to rule out other cause of protein-nephropathy such as SPEP, and UPEP.   5) Iron-deficiency anemia= Patient received a second dose of iv iron on the day of discharge. Hemoglobin and iron levels will be monitored as an outpatient. Will continue to hold po iron as outpatient.   Dispo: Home today    LOS: 6 days   [name] 01/13/2012, 8:41 AM

## 2012-01-13 NOTE — Progress Notes (Addendum)
Continuing to follow and [place] arranged, IV antibiotics and dressing changes will be provided by [facility]. [facility] to provide care. [place] aide ordered in error, pt's home care must be provided by [facility].  Jasmine Pang RN MPH Case Manager [phone]   a  CARE MANAGEMENT NOTE 01/14/2012  Patient:  [name],[name]   Account Number:  [id number]  Date Initiated:  01/13/2012  Documentation initiated by:  Kase Shughart  Subjective/Objective Assessment:   Pt will need IV antibiotic and dressing changes.     Action/Plan:   [facility] to provide [facility] for IV adm and teaching and dressing changes.   Anticipated DC Date:  01/13/2012   Anticipated DC Plan:           [facility] Choice  HOME HEALTH   Choice offered to / List presented to:          [facility] arranged  HH-1 RN      Status of service:  ACYCLOVIR Medicare Important Message given?   (If response is "NO", the following Medicare IM given date fields will be blank) Date Medicare IM given:   Date Additional Medicare IM given:    Discharge Disposition:  [place]  Per UR Regulation:    Comments:  HHRN for IV antibiotic administration and Dressing changes and pt and family education to preform these task. Jasmine Pang RN MPH

## 2012-01-13 NOTE — Discharge Summary (Signed)
Internal [facility] Discharge Note  Name: [name] MRN: [id number] DOB: Jan 11, 1966 46 y.o.  Date of Admission: 01/07/2012  1:25 PM Date of Discharge: 01/13/2012 Attending Physician: Carlyle Basques, MD  Discharge Diagnosis: Lower Extremity Wound.  Active Problems: Necrotizing fasciitis, DMII, HTN, EtOH abuse  Discharge Medications: Medication List  As of 01/13/2012 11:53 AM   STOP taking these medications         amLODipine 10 MG tablet      doxycycline 100 MG tablet      ferrous sulfate 325 (65 FE) MG tablet      folic acid 1 MG tablet      methocarbamol 500 MG tablet      thiamine 100 MG tablet         TAKE these medications         collagenase ointment   Commonly known as: SANTYL   Apply topically daily.      dextrose 5 % SOLN 50 mL with cefTRIAXone 2 G SOLR 2 g   Inject 2 g into the vein daily.      furosemide 40 MG tablet   Commonly known as: LASIX   Take 1 tablet (40 mg total) by mouth 2 (two) times daily.      insulin NPH-insulin regular (70-30) 100 UNIT/ML injection   Commonly known as: NOVOLIN 70/30   Please inject 23 units twice daily, one is before breakfast and another is before dinner.      lisinopril 10 MG tablet   Commonly known as: PRINIVIL,ZESTRIL   Take 1 tablet (10 mg total) by mouth daily.      metFORMIN 1000 MG tablet   Commonly known as: GLUCOPHAGE   Take 1 tablet (1,000 mg total) by mouth 2 (two) times daily with a meal.      oxyCODONE-acetaminophen 5-325 MG per tablet   Commonly known as: PERCOCET   Take 1-2 tablets by mouth every 4 (four) hours as needed.           Disposition and follow-up:   [name] was discharged from [facility] in Stable condition.    Follow-up Appointments: Follow-up Information    Follow up with Ivor Costa, MD on 01/24/2012. (at 1:45pm)    Contact information:   [address] [place] [phone]       Follow up  with Tennis Must, MD on 01/14/2012. (Attend the office on Friday to get hydrotherapy for your thumb, and evaluation for further surgery.)    Contact information:   [place] [phone]       Follow up with [place] on 01/17/2012. (at 7:00pm)    Contact information:   [address], [address] [place]        Dr. Carlyle Basques Thursday March 7 at 11:15.   Discharge Orders    Future Appointments: Provider: Department: Dept Phone: Center:   01/24/2012 1:45 PM Ivor Costa, MD [facility] [phone] [facility]     Future Orders Please Complete By Expires   Diet - low sodium heart healthy      Increase activity slowly      Change dressing (specify)      Comments:   Dressing change:daily on your legs and as prescribed by your hand surrgeon   Leave dressing on - Keep it clean, dry, and intact until clinic visit  Consultations: Treatment Team:  Tennis Must, MD Salem Senate, MD  Procedures Performed:  Dg Chest 2 View 01/07/2012  IMPRESSION:  1.  Mild left lower lobe atelectasis versus scarring. 2.  Stable mild cardiomegaly.  No evidence of congestive heart failure.    US Renal 01/10/2012 IMPRESSION: Normal exam, unchanged.   Dg Hand 2 View Left 01/11/2012 IMPRESSION:  1.  Band of lucency along the webspace of the thumb likely relates to a tissue flap and does not have the infiltrative gas appearance of the prior fasciitis.  Correlate with physical inspection to exclude ulceration. 2.  Amputation of the thumb at the level of the base of the proximal phalanx. 3.  Remote amputation of the distal phalanx of the third finger.   Dg Hand Complete Left 12/23/2011 IMPRESSION: Marked  subcutaneous emphysema involving the left thumb which is consistent with cellulitis from a gas-forming organism.    Dg Foot Complete Left 01/07/2012 IMPRESSION: Soft tissue deformity laterally.  No osseous finding.    Dg  Foot Complete Right 01/07/2012 IMPRESSION: No osseous finding.    2D Echo:  01/09/2012 Study Conclusions- Left ventricle: The cavity size was normal. There was moderate concentric hypertrophy. Systolic function was normal. The estimated ejection fraction was in the range of 55% to 60%. Wall motion was normal; there were no regional wall motion abnormalities. Aortic valve: Trileaflet; mildly thickened, mildly calcified leaflets. Mobility was not restricted.  There was no stenosis. No regurgitation. Aorta: Aortic root: The aortic root was normal in size. Mitral valve: Structurally normal valve. Mobility was not restricted. There was no evidence for stenosis. Trivial regurgitation. Pulmonic valve: There was no evidence for stenosis. Tricuspid valve: Structurally normal valve. Mild regurgitation. Systemic veins: Inferior vena cava: The vessel was normal in size.  Admission HPI:  This is 46 year old female with PMH of DM II, HTN, ETOH abuse and recent hospitalization of left thumb necrotizing faciitis who presents to the ED with bilateral lower extremities swelling and skin breakdown. Patient states that she noticed her feet swelling a few days ago, accompanied with some blisters. The right foot blister "popped open" 2 days ago and some whitish-yellowish drainage noted, and left foot "popped open" today as well. Patient did not seek any medical attention until today when she presents to ED for further evaluation. Denies headache, fever, or sore throat. Denies shortness of breath or dyspnea on exertion. Denies chest pain, chest pressure or palpitation. Denies nausea, vomiting, or abdominal pain. Denies melena, diarrhea or incontinence. Denies muscle weakness. Denies depression. No appetite or weight changes. Of note, she was admitted on 12/23/11 for left thumb necrotizing fasciitis, which was surgically I&D'd along with partial amputation by Dr. Fredna Dow on 12/24/11 and 12/26/11. Patient was treated with Vanc and Zosyn and  discharged with 4-week Doxycycline therapy. Patient states that she had been compliant with her medications.  Hospital Course by problem list:  1) Left thumb osteomyelitis = Patient had left thumb debridement and amputation in mid-January. Patient was initially managed with doxycycline and then ancef. However during her current hospitalization her left thumb became more red, raised, and productive of purulent material. She never had any thumb pain, fever, or elevations in her WBCs. Her hand x-ray did not reveal any infiltrative gas. Sedimentation rate was elevated to 123, and crp elevated to 8.51. Given her physical exam and history of mixed Gram positive and anaerbic infections, she was started on a 6 week course of Ceftriaxone 2G iv once daily. Her last dose  should be February 22, 2012. She was sent home with a PICC line and will be followed by home health. Her current wound cultures have positive cultures for gram negative rods, but speciation is pending at the time of discharge. If these cultures speciate pseudomonas, her antibiotic regimen may need to be adjusted. During her stay, the wounds were managed with saline dressings by wound care. At discharge, the wound had fibrinous exudate covering the wound at the base of the thumb, with less exudate and redness around the exposed bone. Patient is scheduled for thumb hydrotherapy and further evaluation by Dr. Fredna Dow after her discharge.  -- Ceftriaxone 2G iv daily until March 19.  -- Follow up with Dr. Fredna Dow on Friday February 8. -- Follow up with Dr. Baxter Flattery on Thursday March 7. -- PCP: Follow up on wound culture speciation and sensitivities.  2) Lower extremity wounds = Patient presented to the hospital with bilateral lower extremity edema with ruptured bullae on the medial aspect of her right foot, and lateral aspect her left foot. The patient also presented with chronic ulceration on her right shin. The resulting wounds measured 1X3cm on the left lateral  foot, 1x2cm on between the 1st and 2nd toes, 4x10cm of the medial right foot, 1.5x5cm of the right posterior ankle, and 1.5x1.5cm of the right calf. Bilateral foot x-rays did not reveal any osseous finding or soft tissue swelling. Wound care managed the wounds with hydrotherapy, collagenase on soles of feet, and aquacell between affected toes. These wounds were never hot, red, raised, or productive of purulent material. Patient was up-titrated to Lasix 40mg  po twice a day to reduce lower extremity edema. The patient's leg edema resolved, but only saw mild improvement in foot edema bilaterally. The patient was educated on the importance of a low sodium diet and the importance of keeping her feet raised when not walking. The most likely etiology of the wounds are desquamitization secondary to lower extremity edema secondary to diabetic nephroapthy. Patient's albumin was low at 2.3, however given the patient's history of alcoholism it is difficult to know if this is entirely due to proteinuria or malnutrition. The patient does present with a new onset murmur, however, echocardiogram does not reveal any major structural or functional cardiac abnormalities. Given the patient's unusual presentation, wound care recommends outpatient dermatological skin biopsy to evaluate for vasculitic processes.  -- Follow up with wound care on Monday February 11. -- Home health to administer daily dressing changes.  -- PCP: Please schedule this patient with a dermatologist to further evaluate cause of bullae formation.   3) Diabetes mellitus, poorly controlled c/b retinopathy, neuropathy and proteinuira = Patient has at least a 4 year history of uncontrolled diabetes. Her  A1C on admission was 12.3. Her 24-Hour urine protein revealed 2.4G per day. The patient was consulted by nephrologist Dr. Hassell Done in the hospital. Given physical exam finding consistent with diabetic retinopathy, renal biopsy was not pursued. Other causes of  nephropathy including HIV and Hep B are negative. SPEP and UPEP are still pending at the time of discharge.  Patient's NPH/Aspart 70/30 was increased from 15U on admission to 23U bid by discharge. However, her fasting glucose was still elevated to 190 by discharge. Upon discharge, her home metformin was restarted. Patient maintained normal Cr during her hospitalization.  -- PCP: Follow up on SPEP/UPEP. -- PCP: Please schedule patient was an ophthalmologist to further evaluate retinopathy.   4) Hypertension = Patient presented with hypertension managed with Amlodipine. However, she was  switched to lisinopril given her lower extremity edema, diabetes history, and new finding of proteinuria. Per nephrology, the patient's goal blood pressure is 120/70-130/80. On discharge, her blood pressure was 119/74 on lisinopril 10mg  po daily. Patient's potassium was 4.7, after several days of lisinopril treatment.  -- PCP: Maximize lisinopril as tolerate by blood pressure  5) Iron-deficiency anemia= Patient has a history of iron-deficiency anemia since mid-January. Patient was not able to deliver a stool sample for FOBT. Patient's urine sample was positive for 7-10 RBC/HPF and "moderate" urine dipstick hemoglobin. She has been on oral iron, with minimal improvement in hgb. During her hospitalization, the patient had a stable hemoglobin in the low 7.0s, without symptoms or tachycardia. Patient was given 2 doses of Feraheme 510 mg IV. Patient tolerated iv iron without any side effects.   --PCP: Monitor iron and hemoglobin   Discharge Vitals:  BP 119/75  Pulse 84  Temp(Src) 97.8 F (36.6 C) (Oral)  Resp 18  Ht 5\' 6"  (1.676 m)  Wt 82.781 kg (182 lb 8 oz)  BMI 29.46 kg/m2  SpO2 94%  LMP 12/23/2011  Discharge Labs:  Results for orders placed during the hospital encounter of 01/07/12 (from the past 24 hour(s))  GLUCOSE, CAPILLARY     Status: Abnormal   Collection Time   01/12/12 12:43 PM      Component Value Range     Glucose-Capillary 165 (*) 70 - 99 (mg/dL)   Comment 1 Notify RN    HIV ANTIBODY (ROUTINE TESTING)     Status: Normal   Collection Time   01/12/12  3:34 PM      Component Value Range   HIV NON REACTIVE  NON REACTIVE   GLUCOSE, CAPILLARY     Status: Abnormal   Collection Time   01/12/12  5:38 PM      Component Value Range   Glucose-Capillary 219 (*) 70 - 99 (mg/dL)   Comment 1 Notify RN    GLUCOSE, CAPILLARY     Status: Abnormal   Collection Time   01/12/12  9:30 PM      Component Value Range   Glucose-Capillary 105 (*) 70 - 99 (mg/dL)  BASIC METABOLIC PANEL     Status: Abnormal   Collection Time   01/13/12  6:21 AM      Component Value Range   Sodium 135  135 - 145 (mEq/L)   Potassium 4.7  3.5 - 5.1 (mEq/L)   Chloride 98  96 - 112 (mEq/L)   CO2 27  19 - 32 (mEq/L)   Glucose, Bld 175 (*) 70 - 99 (mg/dL)   BUN 21  6 - 23 (mg/dL)   Creatinine, Ser 0.86  0.50 - 1.10 (mg/dL)   Calcium 9.6  8.4 - 10.5 (mg/dL)   GFR calc non Af Amer 81 (*) >90 (mL/min)   GFR calc Af Amer >90  >90 (mL/min)  GLUCOSE, CAPILLARY     Status: Abnormal   Collection Time   01/13/12  8:21 AM      Component Value Range   Glucose-Capillary 190 (*) 70 - 99 (mg/dL)   Comment 1 Notify RN      Signed: [name] 01/13/2012, 11:53 AM

## 2012-01-13 NOTE — Progress Notes (Signed)
Discharge instructions/Med Rec Sheet reviewed w/ pt. Pt expressed understanding and copies given w/ prescriptions. Pt d/c'd in stable condition via w/c, accompanied by NT  

## 2012-01-13 NOTE — Progress Notes (Signed)
PT Hydrotherapy Treatment   01/13/2012  Time In: [id number]  Time Out: [id number]  Pt adm 01/07/2012 with bil foot edema with blistering and opening of lesions.  Past Medical History: necrotizing fasciitis of lt thumb with partial amputation, DM, HTN  Evaluation and treatment procedures explained to pt.  Pt agreed to treatment.  Date of Onset: Foot wounds 3-4 days ago, thumb wound > 19month  Pain: 6/10 Lt hand.  Nsing aware.   Sensation: WFL per pt report.  Drainage: Moderate yellow (feet), moderate yellow (thumb)  Edema: Present (feet and thumb)  Erythema: Absent  Odor: None  Wound #2 Right plantar foot with desquamanation  Length: 12 cm  Width: 10 cm  Depth: .2 cm  Undermining:  Pink: 70%  Slough: 30%  Wound #3 Right great toe and 2nd toe space with full thickness wound  Length: 1 cm  Width: .5 cm  Depth: 1 cm  Undermining:  Granulation: 100%  Slough: %  Eschar: %  Wound #4 Left lateral foot with peeling blister  Length: 10 cm  Width: 4 cm  Depth: .2 cm  Undermining:  Pink: 70%  Slough: 30%  Eschar: %  Treatment: Pulsatile lavage with suction at 4-8 psi x 1023ml NS with splash shield diverter tip to bil feet.   Dressing:  aquacel between 1st and 2nd toes on rt foot: Santyl, NS 4x4's, dry 4x4's, guaze wrap bil feet.  Assessment/Recommendations: Continue hydrotherapy to remove slough and promote healing.  Goals: (Frequency/Duration 6x/wk for 1 wk) (Potential good)  Improve the function of pt's integumentary system by progressing the wounds through the phases of wound healing (inflammation - proliferation - remodeling) by:  #1 Decrease slough to 40%  #2 Increase granulation to 60%  #3 Pt will be able to verbalize understanding of dressing changes for home.  Goals and treatment plan made and agreed with upon by pt  Follow/up recommendations: Home health (feet)/ MD office (lt. Thumb)  Hydrotherapy Plan: Pulsatile Lavage with suction, selective debridement, dressing change, pt/family  education.    Sarajane Marek, [place] [phone] 01/13/2012

## 2012-01-13 NOTE — Progress Notes (Signed)
Internal Medicine Teaching Service Attending Note Date: 01/13/2012  Patient name: [name]  Medical record number: [id number]  Date of birth: 12-28-1965    This patient has been seen and discussed with the house staff. Please see their note for complete details. I concur with their findings with the following additions/corrections:  - we will discharge patient home on ceftriaxone 2gm IV daily - we will have micro lab isolate GNR from culture and do sensitivities. Based on prior cultures from her surgery, ceftriaxone should provide sufficient coverage. She has not had pseudomonas isolated in the past. - she will have follow up with dr. Fredna Dow for her hand, wound care for her legs, PCP for diabetes and myself in ID clinic in 4-6 wks for her osteomyelitis.  Carlyle Basques 01/13/2012, 12:13 PM

## 2012-01-14 ENCOUNTER — Telehealth: Payer: Self-pay | Admitting: *Deleted

## 2012-01-14 ENCOUNTER — Other Ambulatory Visit: Payer: Self-pay | Admitting: Orthopedic Surgery

## 2012-01-14 LAB — WOUND CULTURE: Gram Stain: NONE SEEN

## 2012-01-14 NOTE — Telephone Encounter (Signed)
Amy a pharmacist with [facility] called and advised that this patient was discharged from the hospital on Rocephin and she is a charity case. Due to this she wanted to know if the patient could be switched to [facility] as they can be reimbursed for this drug. Advised Amy that I would have to contact the provider to make that change. I called the provider and gave her the contact number for Amy and she advised she will handle from this point.

## 2012-01-17 ENCOUNTER — Encounter (HOSPITAL_BASED_OUTPATIENT_CLINIC_OR_DEPARTMENT_OTHER): Payer: Self-pay | Attending: Internal Medicine

## 2012-01-18 ENCOUNTER — Ambulatory Visit (HOSPITAL_BASED_OUTPATIENT_CLINIC_OR_DEPARTMENT_OTHER)
Admission: RE | Admit: 2012-01-18 | Discharge: 2012-01-18 | Disposition: A | Discharge status: Home / Self Care | Payer: Self-pay | Source: Ambulatory Visit | Attending: Orthopedic Surgery | Admitting: Orthopedic Surgery

## 2012-01-18 ENCOUNTER — Ambulatory Visit (HOSPITAL_BASED_OUTPATIENT_CLINIC_OR_DEPARTMENT_OTHER): Payer: Self-pay | Admitting: Anesthesiology

## 2012-01-18 ENCOUNTER — Encounter (HOSPITAL_BASED_OUTPATIENT_CLINIC_OR_DEPARTMENT_OTHER): Payer: Self-pay | Admitting: Anesthesiology

## 2012-01-18 ENCOUNTER — Encounter (HOSPITAL_BASED_OUTPATIENT_CLINIC_OR_DEPARTMENT_OTHER): Payer: Self-pay | Admitting: Orthopedic Surgery

## 2012-01-18 ENCOUNTER — Encounter (HOSPITAL_BASED_OUTPATIENT_CLINIC_OR_DEPARTMENT_OTHER)
Admission: RE | Disposition: A | Discharge status: Home / Self Care | Payer: Self-pay | Source: Ambulatory Visit | Attending: Orthopedic Surgery

## 2012-01-18 ENCOUNTER — Other Ambulatory Visit: Payer: Self-pay | Admitting: Orthopedic Surgery

## 2012-01-18 ENCOUNTER — Encounter (HOSPITAL_BASED_OUTPATIENT_CLINIC_OR_DEPARTMENT_OTHER): Payer: Self-pay | Admitting: *Deleted

## 2012-01-18 DIAGNOSIS — I10 Essential (primary) hypertension: Secondary | ICD-10-CM | POA: Insufficient documentation

## 2012-01-18 DIAGNOSIS — I739 Peripheral vascular disease, unspecified: Secondary | ICD-10-CM | POA: Insufficient documentation

## 2012-01-18 DIAGNOSIS — Y835 Amputation of limb(s) as the cause of abnormal reaction of the patient, or of later complication, without mention of misadventure at the time of the procedure: Secondary | ICD-10-CM | POA: Insufficient documentation

## 2012-01-18 DIAGNOSIS — T8789 Other complications of amputation stump: Secondary | ICD-10-CM | POA: Insufficient documentation

## 2012-01-18 DIAGNOSIS — Z794 Long term (current) use of insulin: Secondary | ICD-10-CM | POA: Insufficient documentation

## 2012-01-18 DIAGNOSIS — E119 Type 2 diabetes mellitus without complications: Secondary | ICD-10-CM | POA: Insufficient documentation

## 2012-01-18 HISTORY — PX: AMPUTATION: SHX166

## 2012-01-18 LAB — POCT I-STAT, CHEM 8
BUN: 21 mg/dL (ref 6–23)
Calcium, Ion: 1.09 mmol/L — ABNORMAL LOW (ref 1.12–1.32)
Chloride: 102 mEq/L (ref 96–112)
Creatinine, Ser: 1 mg/dL (ref 0.50–1.10)
Glucose, Bld: 134 mg/dL — ABNORMAL HIGH (ref 70–99)
HCT: 29 % — ABNORMAL LOW (ref 36.0–46.0)
Hemoglobin: 9.9 g/dL — ABNORMAL LOW (ref 12.0–15.0)
Potassium: 4.1 mEq/L (ref 3.5–5.1)
Sodium: 137 mEq/L (ref 135–145)
TCO2: 27 mmol/L (ref 0–100)

## 2012-01-18 LAB — GLUCOSE, CAPILLARY: Glucose-Capillary: 130 mg/dL — ABNORMAL HIGH (ref 70–99)

## 2012-01-18 SURGERY — AMPUTATION DIGIT
Anesthesia: General | Site: Hand | Laterality: Left | Wound class: Dirty or Infected

## 2012-01-18 MED ORDER — ONDANSETRON HCL 4 MG/2ML IJ SOLN
INTRAMUSCULAR | Status: DC | PRN
  Administered 2012-01-18: 4 mg via INTRAVENOUS

## 2012-01-18 MED ORDER — FENTANYL CITRATE 0.05 MG/ML IJ SOLN
50.0000 ug | INTRAMUSCULAR | Status: DC | PRN
  Administered 2012-01-18: 100 ug via INTRAVENOUS

## 2012-01-18 MED ORDER — PROPOFOL 10 MG/ML IV EMUL
INTRAVENOUS | Status: DC | PRN
  Administered 2012-01-18: 200 mg via INTRAVENOUS

## 2012-01-18 MED ORDER — CEFAZOLIN SODIUM 1-5 GM-% IV SOLN
INTRAVENOUS | Status: DC | PRN
  Administered 2012-01-18: 2 g via INTRAVENOUS

## 2012-01-18 MED ORDER — LIDOCAINE HCL 1 % IJ SOLN
INTRAMUSCULAR | Status: DC | PRN
  Administered 2012-01-18 (×2): 2 mL via INTRADERMAL

## 2012-01-18 MED ORDER — CEFAZOLIN SODIUM 1-5 GM-% IV SOLN: 1.0000 g | INTRAVENOUS | Status: DC

## 2012-01-18 MED ORDER — OXYCODONE-ACETAMINOPHEN 5-325 MG PO TABS: ORAL_TABLET | ORAL | Status: AC

## 2012-01-18 MED ORDER — MIDAZOLAM HCL 2 MG/2ML IJ SOLN
0.5000 mg | INTRAMUSCULAR | Status: DC | PRN
  Administered 2012-01-18: 2 mg via INTRAVENOUS

## 2012-01-18 MED ORDER — LACTATED RINGERS IV SOLN
INTRAVENOUS | Status: DC
  Administered 2012-01-18: 09:00:00 via INTRAVENOUS
  Administered 2012-01-18: 10 mL/h via INTRAVENOUS

## 2012-01-18 MED ORDER — METOCLOPRAMIDE HCL 5 MG/ML IJ SOLN: 10.0000 mg | Freq: Once | INTRAMUSCULAR | Status: DC | PRN

## 2012-01-18 MED ORDER — MORPHINE SULFATE 2 MG/ML IJ SOLN: 0.0500 mg/kg | INTRAMUSCULAR | Status: DC | PRN

## 2012-01-18 MED ORDER — METOCLOPRAMIDE HCL 5 MG/ML IJ SOLN
INTRAMUSCULAR | Status: DC | PRN
  Administered 2012-01-18 (×2): 5 mg via INTRAVENOUS

## 2012-01-18 MED ORDER — FENTANYL CITRATE 0.05 MG/ML IJ SOLN: 25.0000 ug | INTRAMUSCULAR | Status: DC | PRN

## 2012-01-18 MED ORDER — ROPIVACAINE HCL 5 MG/ML IJ SOLN
INTRAMUSCULAR | Status: DC | PRN
  Administered 2012-01-18: 25 mL via EPIDURAL

## 2012-01-18 SURGICAL SUPPLY — 58 items
BANDAGE ELASTIC 3 VELCRO ST LF (GAUZE/BANDAGES/DRESSINGS) IMPLANT
BANDAGE GAUZE ELAST BULKY 4 IN (GAUZE/BANDAGES/DRESSINGS) ×1 IMPLANT
BANDAGE GAUZE STRT 1 STR LF (GAUZE/BANDAGES/DRESSINGS) IMPLANT
BLADE MINI RND TIP GREEN BEAV (BLADE) IMPLANT
BLADE SURG 15 STRL LF DISP TIS (BLADE) ×2 IMPLANT
BLADE SURG 15 STRL SS (BLADE) ×4
BNDG CMPR 9X4 STRL LF SNTH (GAUZE/BANDAGES/DRESSINGS) ×1
BNDG CMPR MD 5X2 ELC HKLP STRL (GAUZE/BANDAGES/DRESSINGS)
BNDG COHESIVE 1X5 TAN STRL LF (GAUZE/BANDAGES/DRESSINGS) ×1 IMPLANT
BNDG COHESIVE 3X5 TAN STRL LF (GAUZE/BANDAGES/DRESSINGS) ×1 IMPLANT
BNDG ELASTIC 2 VLCR STRL LF (GAUZE/BANDAGES/DRESSINGS) IMPLANT
BNDG ESMARK 4X9 LF (GAUZE/BANDAGES/DRESSINGS) ×1 IMPLANT
CHLORAPREP W/TINT 26ML (MISCELLANEOUS) ×2 IMPLANT
CLOTH BEACON ORANGE TIMEOUT ST (SAFETY) ×2 IMPLANT
CORDS BIPOLAR (ELECTRODE) ×2 IMPLANT
COVER MAYO STAND STRL (DRAPES) ×2 IMPLANT
COVER TABLE BACK 60X90 (DRAPES) ×2 IMPLANT
DECANTER SPIKE VIAL GLASS SM (MISCELLANEOUS) IMPLANT
DRAIN PENROSE 1/4X12 LTX STRL (WOUND CARE) IMPLANT
DRAPE EXTREMITY T 121X128X90 (DRAPE) ×2 IMPLANT
DRAPE OEC MINIVIEW 54X84 (DRAPES) IMPLANT
DRAPE SURG 17X23 STRL (DRAPES) ×2 IMPLANT
GAUZE PACKING IODOFORM 1/4X5 (PACKING) ×1 IMPLANT
GAUZE SPONGE 4X4 12PLY STRL LF (GAUZE/BANDAGES/DRESSINGS) ×1 IMPLANT
GAUZE XEROFORM 1X8 LF (GAUZE/BANDAGES/DRESSINGS) ×2 IMPLANT
GLOVE BIO SURGEON STRL SZ7.5 (GLOVE) ×2 IMPLANT
GLOVE BIOGEL PI IND STRL 7.0 (GLOVE) IMPLANT
GLOVE BIOGEL PI IND STRL 8 (GLOVE) ×1 IMPLANT
GLOVE BIOGEL PI INDICATOR 7.0 (GLOVE) ×1
GLOVE BIOGEL PI INDICATOR 8 (GLOVE) ×1
GLOVE ECLIPSE 6.5 STRL STRAW (GLOVE) ×1 IMPLANT
GOWN PREVENTION PLUS XLARGE (GOWN DISPOSABLE) ×2 IMPLANT
GOWN PREVENTION PLUS XXLARGE (GOWN DISPOSABLE) ×3 IMPLANT
GOWN STRL REIN XL XLG (GOWN DISPOSABLE) ×2 IMPLANT
NS IRRIG 1000ML POUR BTL (IV SOLUTION) ×2 IMPLANT
PACK BASIN DAY SURGERY FS (CUSTOM PROCEDURE TRAY) ×2 IMPLANT
PADDING CAST ABS 4INX4YD NS (CAST SUPPLIES) ×1
PADDING CAST ABS COTTON 4X4 ST (CAST SUPPLIES) ×1 IMPLANT
SPLINT FNGR BALL END 5/8X4.25 (SOFTGOODS) IMPLANT
SPLINT PLASTALUME BALL 4 1/4IN (SOFTGOODS)
SPONGE GAUZE 4X4 12PLY (GAUZE/BANDAGES/DRESSINGS) ×2 IMPLANT
STOCKINETTE 4X48 STRL (DRAPES) ×2 IMPLANT
SUT CHROMIC 4 0 P 3 18 (SUTURE) IMPLANT
SUT ETHILON 3 0 PS 1 (SUTURE) IMPLANT
SUT ETHILON 4 0 PS 2 18 (SUTURE) ×1 IMPLANT
SUT MERSILENE 4 0 P 3 (SUTURE) IMPLANT
SUT MON AB 5-0 P3 18 (SUTURE) IMPLANT
SUT VIC AB 3-0 SH 27 (SUTURE) ×2
SUT VIC AB 3-0 SH 27X BRD (SUTURE) IMPLANT
SUT VIC AB 4-0 P-3 18XBRD (SUTURE) IMPLANT
SUT VIC AB 4-0 P3 18 (SUTURE)
SWAB CULTURE LIQ STUART DBL (MISCELLANEOUS) ×1 IMPLANT
SYR BULB 3OZ (MISCELLANEOUS) ×2 IMPLANT
SYR CONTROL 10ML LL (SYRINGE) IMPLANT
TOWEL OR 17X24 6PK STRL BLUE (TOWEL DISPOSABLE) ×2 IMPLANT
TUBE ANAEROBIC SPECIMEN COL (MISCELLANEOUS) ×1 IMPLANT
UNDERPAD 30X30 INCONTINENT (UNDERPADS AND DIAPERS) ×2 IMPLANT
WATER STERILE IRR 1000ML POUR (IV SOLUTION) ×1 IMPLANT

## 2012-01-18 NOTE — Brief Op Note (Signed)
01/18/2012  10:27 AM  PATIENT:  [name]  46 y.o. female  PRE-OPERATIVE DIAGNOSIS:  infection left thumb  POST-OPERATIVE DIAGNOSIS:  left thumb infection  PROCEDURE:  Procedure(s) (LRB): AMPUTATION DIGIT (Left)  SURGEON:  Surgeon(s) and Role:    * Tennis Must, MD - Primary    * Wynonia Sours, MD - Assisting  PHYSICIAN ASSISTANT:   ASSISTANTS: none   ANESTHESIA:   regional and general  EBL:  Total I/O In: 700 [I.V.:700] Out: -   BLOOD ADMINISTERED:none  DRAINS: iodoform packing  LOCAL MEDICATIONS USED:  NONE  SPECIMEN:  Source of Specimen:  left thumb proximal phalanx  DISPOSITION OF SPECIMEN:  PATHOLOGY  COUNTS:  YES  TOURNIQUET:   Total Tourniquet Time Documented: Upper Arm (Left) - 20 minutes  DICTATION: .Other Dictation: Dictation Number [id number]  PLAN OF CARE: Discharge to home after PACU  PATIENT DISPOSITION:  PACU - hemodynamically stable.

## 2012-01-18 NOTE — Anesthesia Procedure Notes (Addendum)
Anesthesia Regional Block:  Supraclavicular block  Pre-Anesthetic Checklist: ,, timeout performed, Correct Patient, Correct Site, Correct Laterality, Correct Procedure, Correct Position, site marked, Risks and benefits discussed,  Surgical consent,  Pre-op evaluation,  At surgeon's request and post-op pain management  Laterality: Left  Prep: chloraprep       Needles:   Needle Type: Other   (Arrow Echogenic)   Needle Length: 9cm  Needle Gauge: 21    Additional Needles:  Procedures: ultrasound guided Supraclavicular block Narrative:  Start time: 01/18/2012 8:51 AM End time: 01/18/2012 8:59 AM Injection made incrementally with aspirations every 5 mL.  Performed by: Personally  Anesthesiologist: C Frederick  Additional Notes: Ultrasound guidance used to: id relevant anatomy, confirm needle position, local anesthetic spread, avoidance of vascular puncture. Picture saved. No complications. Block performed personally by Jessy Oto. Albertina Parr, MD    Supraclavicular block Procedure Name: LMA Insertion Date/Time: 01/18/2012 9:42 AM Performed by: Marlena Clipper Pre-anesthesia Checklist: Patient identified, Emergency Drugs available, Suction available, Patient being monitored and Timeout performed Patient Re-evaluated:Patient Re-evaluated prior to inductionOxygen Delivery Method: Circle System Utilized Preoxygenation: Pre-oxygenation with 100% oxygen Intubation Type: IV induction Ventilation: Mask ventilation without difficulty LMA: LMA with gastric port inserted LMA Size: 4.0 Number of attempts: 1 (atraumatic) Placement Confirmation: positive ETCO2 Tube secured with: Tape (clear tape used) Dental Injury: Teeth and Oropharynx as per pre-operative assessment  Comments: Patient is edentulous

## 2012-01-18 NOTE — Transfer of Care (Signed)
Immediate Anesthesia Transfer of Care Note  Patient: [name]  Procedure(s) Performed: Procedure(s) (LRB): AMPUTATION DIGIT (Left)  Patient Location: PACU  Anesthesia Type: GA combined with regional for post-op pain  Level of Consciousness: awake, alert , oriented and patient cooperative  Airway & Oxygen Therapy: Patient Spontanous Breathing and Patient connected to face mask oxygen  Post-op Assessment: Report given to PACU RN and Post -op Vital signs reviewed and stable  Post vital signs: Reviewed and stable  Complications: No apparent anesthesia complications

## 2012-01-18 NOTE — Progress Notes (Signed)
Assisted Dr. Albertina Parr with left, ultrasound guided, supraclavicular block. Side rails up, monitors on throughout procedure. See vital signs in flow sheet. Tolerated Procedure well.  Family to bedside.

## 2012-01-18 NOTE — H&P (Signed)
[name] is an 46 y.o. female.   Chief Complaint: left thumb infection HPI: 46 yo female had infection left thumb in mid January necessitating I&D and partial amputation of thumb.  Has been in hydro therapy for wound care.  Residual proximal phalanx of thumb remains and is exposed.  Past Medical History  Diagnosis Date  . Diabetes mellitus type 2, uncontrolled   . Hypertension   . Anemia   . Alcohol dependency   . Diabetes mellitus     type 2  . Necrotizing fasciitis     Past Surgical History  Procedure Date  . Amputation     right first and left middle finger amputation 2/2 OM 08/2011 by Dr. Amedeo Plenty  . Cesarean section   . I&d left thumb 12/23/2011  . Tubal ligation   . I&d extremity 12/24/2011    Procedure: IRRIGATION AND DEBRIDEMENT EXTREMITY;  Surgeon: Tennis Must, MD;  Location: [facility];  Service: Orthopedics;  Laterality: Left;  I&Dleft thumb with partial amputation  . I&d extremity 12/26/2011    Procedure: IRRIGATION AND DEBRIDEMENT EXTREMITY;  Surgeon: Tennis Must, MD;  Location: [facility];  Service: Orthopedics;  Laterality: Left;  . I&d extremity 12/23/2011    Procedure: IRRIGATION AND DEBRIDEMENT EXTREMITY;  Surgeon: Tennis Must, MD;  Location: [facility];  Service: Orthopedics;  Laterality: Left;  I&D Left thumb, hand and arm    Family History  Problem Relation Age of Onset  . Diabetes Mother   . Diabetes Father    Social History:  reports that she has never smoked. She has never used smokeless tobacco. She reports that she drinks alcohol. She reports that she does not use illicit drugs.  Allergies: No Known Allergies  Medications Prior to Admission  Medication Dose Route Frequency Provider Last Rate Last Dose  . fentaNYL (SUBLIMAZE) injection 50-100 mcg  50-100 mcg Intravenous PRN Scot Dock, MD   100 mcg at 01/18/12 0853  . heparin lock flush 100 unit/mL  250 Units Intracatheter Prior to discharge Carlyle Basques, MD   250 Units at 01/13/12 1607  .  lactated ringers infusion   Intravenous Continuous Scot Dock, MD 10 mL/hr at 01/18/12 0845    . midazolam (VERSED) injection 0.5-2 mg  0.5-2 mg Intravenous PRN Scot Dock, MD   2 mg at 01/18/12 0853  . DISCONTD: cefTRIAXone (ROCEPHIN) 2 g in dextrose 5 % 50 mL IVPB  2 g Intravenous Q24H Elnora Morrison, MD   2 g at 01/13/12 1512  . DISCONTD: collagenase (SANTYL) ointment   Topical Daily Carlyle Basques, MD      . DISCONTD: collagenase (SANTYL) ointment   Topical Daily Carlyle Basques, MD      . DISCONTD: docusate sodium (COLACE) capsule 100 mg  100 mg Oral BID Vertell Limber, MD   100 mg at 01/13/12 1036  . DISCONTD: ferumoxytol (FERAHEME) injection 510 mg  510 mg Intravenous Q3 days Elnora Morrison, MD   510 mg at 01/11/12 1644  . DISCONTD: folic acid (FOLVITE) tablet 1 mg  1 mg Oral Daily Vertell Limber, MD   1 mg at 01/13/12 1036  . DISCONTD: furosemide (LASIX) tablet 40 mg  40 mg Oral BID Carlyle Basques, MD   40 mg at 01/13/12 0846  . DISCONTD: heparin injection 5,000 Units  5,000 Units Subcutaneous Q8H Vertell Limber, MD   5,000 Units at 01/13/12 1349  . DISCONTD: insulin aspart (novoLOG) injection 0-9 Units  0-9 Units Subcutaneous TID WC Vertell Limber,  MD   2 Units at 01/13/12 1240  . DISCONTD: insulin aspart protamine-insulin aspart (NOVOLOG 70/30) injection 23 Units  23 Units Subcutaneous BID WC Rich Reining, MD   23 Units at 01/13/12 1036  . DISCONTD: lisinopril (PRINIVIL,ZESTRIL) tablet 10 mg  10 mg Oral Daily Janell Quiet, MD   10 mg at 01/13/12 1036  . DISCONTD: methocarbamol (ROBAXIN) tablet 500 mg  500 mg Oral Q6H PRN Vertell Limber, MD   500 mg at 01/12/12 0910  . DISCONTD: ondansetron (ZOFRAN) injection 4 mg  4 mg Intravenous Q6H PRN Vertell Limber, MD      . DISCONTD: ondansetron [facility]) tablet 4 mg  4 mg Oral Q6H PRN Vertell Limber, MD      . DISCONTD: oxyCODONE-acetaminophen (PERCOCET) 5-325 MG per tablet 1-2 tablet  1-2 tablet Oral Q4H PRN Vertell Limber,  MD   2 tablet at 01/13/12 1543  . DISCONTD: polyethylene glycol (MIRALAX / GLYCOLAX) packet 17 g  17 g Oral Daily Janell Quiet, MD   17 g at 01/13/12 1036  . DISCONTD: sodium chloride 0.9 % injection 10-40 mL  10-40 mL Intracatheter PRN Carlyle Basques, MD   10 mL at 01/13/12 1605  . DISCONTD: thiamine (VITAMIN B-1) tablet 100 mg  100 mg Oral Daily Vertell Limber, MD   100 mg at 01/13/12 1036  . DISCONTD: zolpidem (AMBIEN) tablet 5 mg  5 mg Oral QHS PRN Vertell Limber, MD       Medications Prior to Admission  Medication Sig Dispense Refill  . collagenase (SANTYL) ointment Apply topically daily.  15 g  1  . dextrose 5 % SOLN 50 mL with cefTRIAXone 2 G SOLR 2 g Inject 2 g into the vein daily.      . furosemide (LASIX) 40 MG tablet Take 1 tablet (40 mg total) by mouth 2 (two) times daily.  30 tablet  1  . insulin NPH-insulin regular (NOVOLIN 70/30) (70-30) 100 UNIT/ML injection Please inject 23 units twice daily, one is before breakfast and another is before dinner.  10 mL  12  . lisinopril (PRINIVIL,ZESTRIL) 10 MG tablet Take 1 tablet (10 mg total) by mouth daily.  30 tablet  1  . metFORMIN (GLUCOPHAGE) 1000 MG tablet Take 1 tablet (1,000 mg total) by mouth 2 (two) times daily with a meal.  60 tablet  6  . oxyCODONE-acetaminophen (PERCOCET) 5-325 MG per tablet Take 1-2 tablets by mouth every 4 (four) hours as needed.  30 tablet  0    Results for orders placed during the hospital encounter of 01/18/12 (from the past 48 hour(s))  POCT I-STAT, CHEM 8     Status: Abnormal   Collection Time   01/18/12  8:51 AM      Component Value Range Comment   Sodium 137  135 - 145 (mEq/L)    Potassium 4.1  3.5 - 5.1 (mEq/L)    Chloride 102  96 - 112 (mEq/L)    BUN 21  6 - 23 (mg/dL)    Creatinine, Ser 1.00  0.50 - 1.10 (mg/dL)    Glucose, Bld 134 (*) 70 - 99 (mg/dL)    Calcium, Ion 1.09 (*) 1.12 - 1.32 (mmol/L)    TCO2 27  0 - 100 (mmol/L)    Hemoglobin 9.9 (*) 12.0 - 15.0 (g/dL)    HCT 29.0 (*) 36.0 - 46.0  (%)     No results found.   A comprehensive review of systems was negative.  Blood pressure 167/87, pulse 94, temperature  99.3 F (37.4 C), temperature source Oral, resp. rate 18, last menstrual period 12/23/2011, SpO2 100.00%.  General appearance: alert, cooperative and appears stated age Head: Normocephalic, without obvious abnormality, atraumatic Neck: supple, symmetrical, trachea midline Resp: clear to auscultation bilaterally Cardio: regular rate and rhythm GI: soft, non-tender; bowel sounds normal; no masses,  no organomegaly Extremities: left hand with amputation left thumb through proximal phalanx.  bone exposed in wound.  wound with fibrinous tissue and good wound bed.  no erythema. Pulses: 2+ and symmetric Skin: as above Neurologic: Grossly normal Incision/Wound: As above  Assessment/Plan Left thumb s/p infection and partial amputation.  Discussed revision amputation with patient and husband and risks, benefits, alternatives of surgery and they agree with plan of care.  Jasmine Maceachern R 01/18/2012, 9:23 AM

## 2012-01-18 NOTE — Anesthesia Postprocedure Evaluation (Signed)
Anesthesia Post Note  Patient: [name]  Procedure(s) Performed: Procedure(s) (LRB): AMPUTATION DIGIT (Left)  Anesthesia type: General  Patient location: PACU  Post pain: Pain level controlled  Post assessment: Patient's Cardiovascular Status Stable  Last Vitals:  Filed Vitals:   01/18/12 1134  BP: 180/78  Pulse: 95  Temp:   Resp: 19    Post vital signs: Reviewed and stable  Level of consciousness: alert  Complications: No apparent anesthesia complications

## 2012-01-18 NOTE — Op Note (Signed)
Dictation [phone]

## 2012-01-18 NOTE — Anesthesia Preprocedure Evaluation (Signed)
Anesthesia Evaluation  Patient identified by MRN, date of birth, ID band Patient awake    Reviewed: Allergy & Precautions, H&P , NPO status , Patient's Chart, lab work & pertinent test results, reviewed documented beta blocker date and time   Airway Mallampati: II TM Distance: >3 FB Neck ROM: full    Dental   Pulmonary neg pulmonary ROS,          Cardiovascular hypertension, On Medications + Peripheral Vascular Disease     Neuro/Psych Negative Neurological ROS  Negative Psych ROS   GI/Hepatic negative GI ROS, Neg liver ROS,   Endo/Other  Diabetes mellitus-, Poorly Controlled, Type 2, Insulin Dependent and Oral Hypoglycemic Agents  Renal/GU negative Renal ROS  Genitourinary negative   Musculoskeletal   Abdominal   Peds  Hematology negative hematology ROS (+)   Anesthesia Other Findings See surgeon's H&P   Reproductive/Obstetrics negative OB ROS                           Anesthesia Physical Anesthesia Plan  ASA: III  Anesthesia Plan: General   Post-op Pain Management: MAC Combined w/ Regional for Post-op pain   Induction: Intravenous  Airway Management Planned: LMA  Additional Equipment:   Intra-op Plan:   Post-operative Plan: Extubation in OR  Informed Consent: I have reviewed the patients History and Physical, chart, labs and discussed the procedure including the risks, benefits and alternatives for the proposed anesthesia with the patient or authorized representative who has indicated his/her understanding and acceptance.     Plan Discussed with: CRNA and Surgeon  Anesthesia Plan Comments:         Anesthesia Quick Evaluation

## 2012-01-18 NOTE — Discharge Instructions (Addendum)
[facility] Instructions Hand Surgery  Wound Care: Keep your hand elevated above the level of your heart.  Do not allow it to dangle  by your side.  Keep the dressing dry and do not remove it unless your doctor advises you to do so.  He will usually change it at the time of your post-op visit.  Moving your fingers is advised to stimulate circulation but will depend on the site of your surgery.  If you have a splint applied, your doctor will advise you regarding movement.  Activity: Do not drive or operate machinery today.  Rest today and then you may return to your normal activity and work as indicated by your physician.  Diet:  Drink liquids today or eat a light diet.  You may resume a regular diet tomorrow.    General expectations: Pain for two to three days. Fingers may become slightly swollen.  Call your doctor if any of the following occur: Severe pain not relieved by pain medication. Elevated temperature. Dressing soaked with blood. Inability to move fingers. White or bluish color to fingers.  [place]  [place], [place] [address] [phone]   Post Anesthesia Home Care Instructions  Activity: Get plenty of rest for the remainder of the day. A responsible adult should stay with you for 24 hours following the procedure.  For the next 24 hours, DO NOT: -Drive a car -Paediatric nurse -Drink alcoholic beverages -Take any medication unless instructed by your physician -Make any legal decisions or sign important papers.  Meals: Start with liquid foods such as gelatin or soup. Progress to regular foods as tolerated. Avoid greasy, spicy, heavy foods. If nausea and/or vomiting occur, drink only clear liquids until the nausea and/or vomiting subsides. Call your physician if vomiting continues.  Special Instructions/Symptoms: Your throat may feel dry or sore from the anesthesia or the breathing tube placed in your throat during surgery. If  this causes discomfort, gargle with warm salt water. The discomfort should disappear within 24 hours.    Regional Anesthesia Blocks  1. Numbness or the inability to move the "blocked" extremity may last from 3-48 hours after placement. The length of time depends on the medication injected and your individual response to the medication. If the numbness is not going away after 48 hours, call your surgeon.  2. The extremity that is blocked will need to be protected until the numbness is gone and the  Strength has returned. Because you cannot feel it, you will need to take extra care to avoid injury. Because it may be weak, you may have difficulty moving it or using it. You may not know what position it is in without looking at it while the block is in effect.  3. For blocks in the legs and feet, returning to weight bearing and walking needs to be done carefully. You will need to wait until the numbness is entirely gone and the strength has returned. You should be able to move your leg and foot normally before you try and bear weight or walk. You will need someone to be with you when you first try to ensure you do not fall and possibly risk injury.  4. Bruising and tenderness at the needle site are common side effects and will resolve in a few days.  5. Persistent numbness or new problems with movement should be communicated to the surgeon or the [facility] [phone]).

## 2012-01-19 NOTE — Op Note (Signed)
[name], [name] NO.:  [id number]  MEDICAL RECORD NO.:  [id number]  LOCATION:  5123                         FACILITY:  [facility]  PHYSICIAN:  Leanora Cover, MD        DATE OF BIRTH:  September 17, 1966  DATE OF PROCEDURE:  01/18/2012 DATE OF DISCHARGE:  01/18/2012                              OPERATIVE REPORT   PREOPERATIVE DIAGNOSIS:  Left thumb infection with exposed bone after partial amputation.  POSTOPERATIVE DIAGNOSIS:  Left thumb infection with exposed bone after partial amputation.  PROCEDURE:  Revision, amputation of left thumb.  SURGEON:  Leanora Cover, MD.  ASSISTANT:  Daryll Brod, M.D.  ANESTHESIA:  General with regional.  IV FLUIDS:  Per anesthesia flow sheet.  ESTIMATED BLOOD LOSS:  Minimal.  COMPLICATIONS:  None.  SPECIMENS:  Left thumb proximal phalanx to pathology.  TOURNIQUET TIME:  20 minutes.  DISPOSITION:  Stable to PACU.  INDICATIONS:  [name] is a 46 year old female who in mid January suffered an infection of the left thumb.  This was a necrotizing type infection.  This required partial amputation of the thumb through the proximal phalanx.  She was continued on hydrotherapy postoperatively. The remainder of the proximal phalanx became exposed through the wound and desiccated.  I discussed with [name], the nature of her condition.  Recommended revision and amputation of the thumb at the level of the MP joint.  Risks, benefits, and alternatives of surgery were discussed including risk of blood loss, infection, damage to nerves, vessels, tendons, ligaments, bone, failure to surgery, need for additional surgery, complications with wound healing, continued infection, need for repeat irrigation and debridement.  She wishes understanding of this risk and elected to proceed.  OPERATIVE COURSE:  After being identified preoperatively by myself, the patient, the patient's husband, & I agreed upon the procedure and site procedure.   Surgical site was marked.  Risks, benefits, and alternatives of surgery were reviewed and she wished to proceed.  Surgical consent had been signed.  She was given a regional block in preoperative holding by Anesthesia.  She was transported to the operating room, placed on the operating table in supine position with the left upper extremity on arm board.  General anesthesia was induced by the anesthesiologist.  Left upper extremity was prepped and draped in normal sterile orthopedic fashion.  Surgical pause was performed between surgeons, anesthesia, and operating staff and all were in agreement as to the patient, procedure, and site of procedure.  Tourniquet on the proximal aspect of the extremities inflated to 250 mmHg after exsanguination of the limb with an Esmarch bandage.  The wound was debrided.  There was some dried and scabbed tissue that was removed.  The remainder of the proximal phalanx was amputated.  This was sent to pathology for examination.  The wound bed looked good.  There was good granulation tissue on the dorsal aspect.  The remainder of the extensor tendon was sewn together using 3- 0 Vicryl suture to take tension off the skin.  Three vertical mattress sutures were placed with 4-0 nylon to bring the skin over top of the remainder of the metacarpal.  This covered the bone well.  The wound had been copiously irrigated with 500 mL of sterile saline prior to wound repair.  The wound was then dressed with sterile Xeroform.  Iodoform packing was placed in the volar aspect of the wound.  There was area distally left open for any drainage.  A piece of iodoform was placed here as well.  The wound was dressed with sterile Xeroform, 4x4s, and wrapped with Kerlix and Coban dressing lightly.  Tourniquet was deflated at 20 minutes.  The operative drapes were broken down.  The patient was awoken from anesthesia safely.  She was transferred back to stretcher and taken to PACU in  stable condition.  I will see her back in the office in 3 days at which time we will resume hydrotherapy with packing changes.  She will be given Percocet 5/325, 1-2 p.o. q.6 hours p.r.n. pain, dispensed #40.     Leanora Cover, MD     KK/MEDQ  D:  01/18/2012  T:  01/19/2012  Job:  [id number]

## 2012-01-20 ENCOUNTER — Encounter (HOSPITAL_BASED_OUTPATIENT_CLINIC_OR_DEPARTMENT_OTHER): Payer: Self-pay | Admitting: Orthopedic Surgery

## 2012-01-22 LAB — WOUND CULTURE: Gram Stain: NONE SEEN

## 2012-01-24 ENCOUNTER — Ambulatory Visit: Payer: Self-pay | Admitting: Internal Medicine

## 2012-02-02 NOTE — Discharge Summary (Signed)
Internal Medicine Teaching Service Attending Note Date: 02/02/2012  Patient name: [name]  Medical record number: [id number]  Date of birth: 1966-09-22    This patient has been seen and discussed with the house staff. Please see their note for complete details. I concur with their findings and discharge plan for [name].  Carlyle Basques 02/02/2012, 11:53 AM

## 2012-02-08 ENCOUNTER — Telehealth: Payer: Self-pay | Admitting: *Deleted

## 2012-02-08 NOTE — Telephone Encounter (Signed)
Santiago Glad from [facility] called about the end date & pulling the PICC. I told her pt had an appt here with md on the 7th. md will decide then. We can pull it in the clinic if md ordered

## 2012-02-10 ENCOUNTER — Inpatient Hospital Stay: Payer: Self-pay | Admitting: Internal Medicine

## 2012-02-10 ENCOUNTER — Telehealth: Payer: Self-pay | Admitting: Licensed Clinical Social Worker

## 2012-02-10 NOTE — Telephone Encounter (Signed)
Santiago Glad, nurse from [facility] called stating that the patient would not be able to make her appointment today with Dr. Baxter Flattery because she went to [facility] to get her orange card. Per Dr. Baxter Flattery it was ok to reschedule her March 12 at 9:00am and her IV abx will be extended until then. Nurse and patient are aware of the change.

## 2012-02-14 ENCOUNTER — Encounter (HOSPITAL_BASED_OUTPATIENT_CLINIC_OR_DEPARTMENT_OTHER): Payer: Self-pay | Attending: Internal Medicine

## 2012-02-15 ENCOUNTER — Inpatient Hospital Stay: Payer: Self-pay | Admitting: Internal Medicine

## 2012-02-16 ENCOUNTER — Telehealth: Payer: Self-pay | Admitting: *Deleted

## 2012-02-16 NOTE — Telephone Encounter (Signed)
Called patient to reschedule her missed appt. Had to leave a message for her to call and reschedule at her earliest convince.

## 2012-02-18 LAB — FUNGUS CULTURE W SMEAR: Fungal Smear: NONE SEEN

## 2012-02-21 ENCOUNTER — Telehealth: Payer: Self-pay | Admitting: *Deleted

## 2012-02-21 NOTE — Telephone Encounter (Signed)
Call from [facility], patient removed her PICC, said it was falling out. Patient has not established care at this clinic yet. Asked nurse, Stanton Kidney, to call the ID doctor on call for instructions. Myrtis Hopping CMA

## 2012-02-21 NOTE — Telephone Encounter (Signed)
Pt has [facility] appt 03/02/12 w/ Dr. Baxter Flattery.  Orders are to be continued until the pt is seen for f/u visit.  Call to pharmacy at [facility] with this information.

## 2012-02-28 ENCOUNTER — Other Ambulatory Visit: Payer: Self-pay | Admitting: Internal Medicine

## 2012-03-02 ENCOUNTER — Encounter: Payer: Self-pay | Admitting: Internal Medicine

## 2012-03-02 ENCOUNTER — Ambulatory Visit (INDEPENDENT_AMBULATORY_CARE_PROVIDER_SITE_OTHER): Payer: Self-pay | Admitting: Internal Medicine

## 2012-03-02 VITALS — BP 150/87 | HR 97 | Temp 97.4°F | Wt 201.0 lb

## 2012-03-02 DIAGNOSIS — M869 Osteomyelitis, unspecified: Secondary | ICD-10-CM

## 2012-03-02 NOTE — Progress Notes (Signed)
INFECTIOUS DISEASES CLINIC NOTE  RFV: hospital follow up for osteomyelitis of left thumb  Subjective:    Patient ID: [name], female    DOB: 07-Jul-1966, 46 y.o.   MRN: [id number]  HPI  This is 46 year old female with PMH of DM II, HTN, ETOH abuse and recent hospitalization of left thumb necrotizing faciitis s/p I X D and partial amputation in Jan 2013, who subsequently presented to the ED on Feb 1st, 2013 with bilateral lower extremities swelling and skin breakdown. Patient states that she noticed her feet swelling a few days ago, accompanied with some blisters. The right foot blister "popped open" 2 days ago and some whitish-yellowish drainage noted, and left foot "popped open" today as well. Patient did not seek any medical attention until today when she presents to ED for further evaluation. Denies headache, fever, or sore throat. Denies shortness of breath or dyspnea on exertion. Denies chest pain, chest pressure or palpitation. Denies nausea, vomiting, or abdominal pain. Denies melena, diarrhea or incontinence. Denies muscle weakness. Denies depression. No appetite or weight changes. Of note, she was admitted on 12/23/11 for left thumb necrotizing fasciitis, which was surgically I&D'd along with partial amputation by Dr. Fredna Dow on 12/24/11 and 12/26/11. Patient was treated with Vanc and Zosyn and discharged with 4-week Doxycycline therapy. Patient states that she had been compliant with her medications.  However during the February hospitalization, her left thumb became more red, raised, and productive of purulent material. She never had any thumb pain, fever, or elevations in her WBCs. Her hand x-ray did not reveal any infiltrative gas. Sedimentation rate was elevated to 123, and crp elevated to 8.51. Given her physical exam and history of mixed Gram positive and gram negatives (superficial wound cultures idenitified serratia and peptostreptococcus), she was started on a 6 week course of Ceftriaxone  2G iv once daily.  She was sent home with a PICC line and will be followed by home health. At discharge, the wound had fibrinous exudate covering the wound at the base of the thumb, with less exudate and redness around the exposed bone. Patient is scheduled for thumb hydrotherapy and further evaluation by Dr. Fredna Dow after her discharge. She was followed regularly by Dr. Fredna Dow, who subsequently had the patient undergo a revision of her left thumb amputation to the level of MP joint on 01/18/2012 since she had ongoing L thumb infection with exposed bone. The cultures from this latest surgery revealed Pseudomonas Aeruginosa ( cefepime I 16, ceftaz R> 64, cipro 0.5, imi 2, piptazo 128, gent <1, tobra <1). The patient finished her course of ceftriaxone on March 22nd. She states that her antibiotics were not changed by Dr. Fredna Dow based upon the results of her culture.  Since having her surgery, she continues to get wound care three times per week through his office. Her hand is healing slowly. No warmth, or purulent drainage. She still has some discomfort for which she takes oral pain medications. She denies fever, chills, nightsweats.  Prior to Admission medications   Medication Sig Start Date End Date Taking? Authorizing Provider  dextrose 5 % SOLN 50 mL with cefTRIAXone 2 G SOLR 2 g Inject 2 g into the vein daily. 01/13/12  Yes Janell Quiet, MD  furosemide (LASIX) 40 MG tablet Take 1 tablet (40 mg total) by mouth 2 (two) times daily. 01/13/12 01/12/13 Yes Janell Quiet, MD  insulin NPH-insulin regular (NOVOLIN 70/30) (70-30) 100 UNIT/ML injection Please inject 23 units twice daily, one is before breakfast  and another is before dinner. 01/13/12  Yes Janell Quiet, MD  lisinopril (PRINIVIL,ZESTRIL) 10 MG tablet Take 1 tablet (10 mg total) by mouth daily. 01/13/12 01/12/13 Yes Janell Quiet, MD  metFORMIN (GLUCOPHAGE) 1000 MG tablet Take 1 tablet (1,000 mg total) by mouth 2 (two) times daily with a meal. 12/29/11 12/28/12 Yes Ivor Costa, MD     Active Ambulatory Problems    Diagnosis Date Noted  . Diabetes mellitus type 2, uncontrolled 09/01/2011  . Hypertension 09/01/2011  . Acute kidney injury 09/01/2011  . Anemia   . Alcohol dependency   . Necrotizing fasciitis 12/23/2011  . Alcohol abuse 12/23/2011  . Tear of skin of multiple sites of lower extremity 01/07/2012   Resolved Ambulatory Problems    Diagnosis Date Noted  . No Resolved Ambulatory Problems   Past Medical History  Diagnosis Date  . Diabetes mellitus     History  Substance Use Topics  . Smoking status: Never Smoker   . Smokeless tobacco: Never Used  . Alcohol Use: Yes     h/o alcohol abuse   family history includes Diabetes in her father and mother.  Review of Systems  Constitutional: Negative for fever, chills, diaphoresis, activity change, appetite change, fatigue and unexpected weight change.  HENT: Negative for congestion, sore throat, rhinorrhea, sneezing, trouble swallowing and sinus pressure.  Eyes: Negative for photophobia and visual disturbance.  Respiratory: Negative for cough, chest tightness, shortness of breath, wheezing and stridor.  Cardiovascular: Negative for chest pain, palpitations and leg swelling.  Gastrointestinal: Negative for nausea, vomiting, abdominal pain, diarrhea, constipation, blood in stool, abdominal distention and anal bleeding.  Genitourinary: Negative for dysuria, hematuria, flank pain and difficulty urinating.  Musculoskeletal: Negative for myalgias, back pain, joint swelling, arthralgias and gait problem.  Skin: Negative for color change, pallor, rash and wound.  Neurological: Negative for dizziness, tremors, weakness and light-headedness.  Hematological: Negative for adenopathy. Does not bruise/bleed easily.  Psychiatric/Behavioral: Negative for behavioral problems, confusion, sleep disturbance, dysphoric mood, decreased concentration and agitation.       Objective:   Physical Exam BP 150/87  Pulse 97   Temp(Src) 97.4 F (36.3 C) (Oral)  Wt 201 lb (91.173 kg)  LMP 12/13/2011  General Appearance:    Alert, cooperative, no distress, appears stated age  Head:    Normocephalic, without obvious abnormality, atraumatic  Eyes:    PERRL, conjunctiva/corneas clear, EOM's intact,   Ears:    Normal TM's and external ear canals, both ears  Nose:   Nares normal, septum midline, mucosa normal, no drainage    or sinus tenderness  Throat:   Lips, mucosa, and tongue normal; teeth and gums normal  Neck:   Supple, symmetrical, trachea midline, no adenopathy;        Lungs:     Clear to auscultation bilaterally, respirations unlabored      Heart:    Regular rate and rhythm, S1 and S2 normal, no murmur, rub   or gallop     Abdomen:     Soft, non-tender, bowel sounds active all four quadrants,    no masses, no organomegaly        Extremities:   Left thumb amputated wrapped from recent wound care this am. Right hand 2nd digit amputated, otherwisel, atraumatic, no cyanosis or edema  Pulses:   2+ and symmetric all extremities  Skin:   Skin color, texture, turgor normal, healing rash on lower extremities  Lymph nodes:   Cervical, supraclavicular, and axillary nodes normal  Neurologic:  CNII-XII intact, normal strength, sensation and reflexes    throughout       Assessment & Plan:   history of polymicrobial left thumb osteomyelitis s/p amputation at level of MP joint, currently not on any antibiotics, but most recent cultures on 2/12, showed PsA, which would not be covered by CTX.  - will check ESR, CRP, CBC with diff, and BMP. If inflammatory markers, patient may benefit from addn oral antibiotics, and repeat imaging of hand to see if there is still residual infection remaining. - in theory, her recent debridement has removed large bioburden of infection.  Will await for results to decide if need oral suppression, with cipro 750mg  BID.  rtc pending results.

## 2012-03-06 ENCOUNTER — Other Ambulatory Visit: Payer: Self-pay | Admitting: *Deleted

## 2012-03-06 DIAGNOSIS — M869 Osteomyelitis, unspecified: Secondary | ICD-10-CM

## 2012-03-07 ENCOUNTER — Other Ambulatory Visit (INDEPENDENT_AMBULATORY_CARE_PROVIDER_SITE_OTHER): Payer: Self-pay

## 2012-03-07 DIAGNOSIS — M869 Osteomyelitis, unspecified: Secondary | ICD-10-CM

## 2012-03-07 LAB — CBC WITH DIFFERENTIAL/PLATELET
Basophils Absolute: 0.1 10*3/uL (ref 0.0–0.1)
Basophils Relative: 1 % (ref 0–1)
Eosinophils Absolute: 0.5 10*3/uL (ref 0.0–0.7)
Eosinophils Relative: 7 % — ABNORMAL HIGH (ref 0–5)
HCT: 32.7 % — ABNORMAL LOW (ref 36.0–46.0)
Hemoglobin: 9.8 g/dL — ABNORMAL LOW (ref 12.0–15.0)
Lymphocytes Relative: 29 % (ref 12–46)
Lymphs Abs: 2 10*3/uL (ref 0.7–4.0)
MCH: 22.4 pg — ABNORMAL LOW (ref 26.0–34.0)
MCHC: 30 g/dL (ref 30.0–36.0)
MCV: 74.8 fL — ABNORMAL LOW (ref 78.0–100.0)
Monocytes Absolute: 0.4 10*3/uL (ref 0.1–1.0)
Monocytes Relative: 6 % (ref 3–12)
Neutro Abs: 4 10*3/uL (ref 1.7–7.7)
Neutrophils Relative %: 58 % (ref 43–77)
Platelets: 288 10*3/uL (ref 150–400)
RBC: 4.37 MIL/uL (ref 3.87–5.11)
RDW: 14.8 % (ref 11.5–15.5)
WBC: 6.9 10*3/uL (ref 4.0–10.5)

## 2012-03-07 LAB — BASIC METABOLIC PANEL
BUN: 19 mg/dL (ref 6–23)
CO2: 21 mEq/L (ref 19–32)
Calcium: 8.6 mg/dL (ref 8.4–10.5)
Chloride: 104 mEq/L (ref 96–112)
Creat: 0.91 mg/dL (ref 0.50–1.10)
Glucose, Bld: 167 mg/dL — ABNORMAL HIGH (ref 70–99)
Potassium: 4.6 mEq/L (ref 3.5–5.3)
Sodium: 137 mEq/L (ref 135–145)

## 2012-03-07 LAB — SEDIMENTATION RATE: Sed Rate: 49 mm/hr — ABNORMAL HIGH (ref 0–22)

## 2012-03-07 LAB — C-REACTIVE PROTEIN: CRP: 0.91 mg/dL — ABNORMAL HIGH (ref <0.60)

## 2012-04-12 ENCOUNTER — Emergency Department (HOSPITAL_COMMUNITY)
Admission: EM | Admit: 2012-04-12 | Discharge: 2012-04-12 | Disposition: A | Discharge status: Home / Self Care | Payer: Self-pay | Attending: Emergency Medicine | Admitting: Emergency Medicine

## 2012-04-12 ENCOUNTER — Emergency Department (HOSPITAL_COMMUNITY): Payer: Self-pay

## 2012-04-12 ENCOUNTER — Encounter (HOSPITAL_COMMUNITY): Payer: Self-pay | Admitting: *Deleted

## 2012-04-12 DIAGNOSIS — F1021 Alcohol dependence, in remission: Secondary | ICD-10-CM | POA: Insufficient documentation

## 2012-04-12 DIAGNOSIS — S68118A Complete traumatic metacarpophalangeal amputation of other finger, initial encounter: Secondary | ICD-10-CM | POA: Insufficient documentation

## 2012-04-12 DIAGNOSIS — R112 Nausea with vomiting, unspecified: Secondary | ICD-10-CM | POA: Insufficient documentation

## 2012-04-12 DIAGNOSIS — E119 Type 2 diabetes mellitus without complications: Secondary | ICD-10-CM | POA: Insufficient documentation

## 2012-04-12 DIAGNOSIS — I10 Essential (primary) hypertension: Secondary | ICD-10-CM | POA: Insufficient documentation

## 2012-04-12 DIAGNOSIS — D72829 Elevated white blood cell count, unspecified: Secondary | ICD-10-CM

## 2012-04-12 DIAGNOSIS — R109 Unspecified abdominal pain: Secondary | ICD-10-CM | POA: Insufficient documentation

## 2012-04-12 LAB — URINALYSIS, ROUTINE W REFLEX MICROSCOPIC
Bilirubin Urine: NEGATIVE
Glucose, UA: 500 mg/dL — AB
Ketones, ur: NEGATIVE mg/dL
Leukocytes, UA: NEGATIVE
Nitrite: NEGATIVE
Protein, ur: >300 mg/dL — AB
Specific Gravity, Urine: 1.024 (ref 1.005–1.030)
Urobilinogen, UA: 1 mg/dL (ref 0.0–1.0)
pH: 6.5 (ref 5.0–8.0)

## 2012-04-12 LAB — COMPREHENSIVE METABOLIC PANEL
ALT: 12 U/L (ref 0–35)
AST: 16 U/L (ref 0–37)
Albumin: 2.5 g/dL — ABNORMAL LOW (ref 3.5–5.2)
Alkaline Phosphatase: 131 U/L — ABNORMAL HIGH (ref 39–117)
BUN: 22 mg/dL (ref 6–23)
CO2: 21 mEq/L (ref 19–32)
Calcium: 8.7 mg/dL (ref 8.4–10.5)
Chloride: 99 mEq/L (ref 96–112)
Creatinine, Ser: 0.71 mg/dL (ref 0.50–1.10)
GFR calc Af Amer: >90 mL/min (ref >90)
GFR calc non Af Amer: >90 mL/min (ref >90)
Glucose, Bld: 222 mg/dL — ABNORMAL HIGH (ref 70–99)
Potassium: 4.7 mEq/L (ref 3.5–5.1)
Sodium: 133 mEq/L — ABNORMAL LOW (ref 135–145)
Total Bilirubin: 0.3 mg/dL (ref 0.3–1.2)
Total Protein: 7.3 g/dL (ref 6.0–8.3)

## 2012-04-12 LAB — CBC
HCT: 29.3 % — ABNORMAL LOW (ref 36.0–46.0)
Hemoglobin: 9.3 g/dL — ABNORMAL LOW (ref 12.0–15.0)
MCH: 22.7 pg — ABNORMAL LOW (ref 26.0–34.0)
MCHC: 31.7 g/dL (ref 30.0–36.0)
MCV: 71.5 fL — ABNORMAL LOW (ref 78.0–100.0)
Platelets: 426 10*3/uL — ABNORMAL HIGH (ref 150–400)
RBC: 4.1 MIL/uL (ref 3.87–5.11)
RDW: 14.4 % (ref 11.5–15.5)
WBC: 22.5 10*3/uL — ABNORMAL HIGH (ref 4.0–10.5)

## 2012-04-12 LAB — URINE MICROSCOPIC-ADD ON

## 2012-04-12 LAB — DIFFERENTIAL
Basophils Absolute: 0 10*3/uL (ref 0.0–0.1)
Basophils Relative: 0 % (ref 0–1)
Eosinophils Absolute: 0 10*3/uL (ref 0.0–0.7)
Eosinophils Relative: 0 % (ref 0–5)
Lymphocytes Relative: 3 % — ABNORMAL LOW (ref 12–46)
Lymphs Abs: 0.7 10*3/uL (ref 0.7–4.0)
Monocytes Absolute: 0.5 10*3/uL (ref 0.1–1.0)
Monocytes Relative: 2 % — ABNORMAL LOW (ref 3–12)
Neutro Abs: 21.3 10*3/uL — ABNORMAL HIGH (ref 1.7–7.7)
Neutrophils Relative %: 95 % — ABNORMAL HIGH (ref 43–77)

## 2012-04-12 LAB — GLUCOSE, CAPILLARY: Glucose-Capillary: 208 mg/dL — ABNORMAL HIGH (ref 70–99)

## 2012-04-12 LAB — LIPASE, BLOOD: Lipase: 32 U/L (ref 11–59)

## 2012-04-12 MED ORDER — FENTANYL CITRATE 0.05 MG/ML IJ SOLN: 50.0000 ug | Freq: Once | INTRAMUSCULAR | Status: DC

## 2012-04-12 MED ORDER — PROMETHAZINE HCL 25 MG/ML IJ SOLN
25.0000 mg | Freq: Once | INTRAMUSCULAR | Status: DC
  Filled 2012-04-12: qty 1

## 2012-04-12 MED ORDER — PANTOPRAZOLE SODIUM 40 MG IV SOLR: 40.0000 mg | Freq: Once | INTRAVENOUS | Status: DC

## 2012-04-12 MED ORDER — SODIUM CHLORIDE 0.9 % IV BOLUS (SEPSIS): 1000.0000 mL | Freq: Once | INTRAVENOUS | Status: DC

## 2012-04-12 MED ORDER — PROMETHAZINE HCL 25 MG/ML IJ SOLN
25.0000 mg | Freq: Once | INTRAMUSCULAR | Status: AC
  Administered 2012-04-12: 25 mg via INTRAMUSCULAR
  Filled 2012-04-12: qty 1

## 2012-04-12 MED ORDER — PANTOPRAZOLE SODIUM 40 MG PO TBEC
40.0000 mg | DELAYED_RELEASE_TABLET | Freq: Once | ORAL | Status: AC
  Administered 2012-04-12: 40 mg via ORAL
  Filled 2012-04-12: qty 1

## 2012-04-12 MED ORDER — ACETAMINOPHEN-CODEINE #3 300-30 MG PO TABS: 1.0000 | ORAL_TABLET | Freq: Four times a day (QID) | ORAL | Status: AC | PRN

## 2012-04-12 MED ORDER — ONDANSETRON 8 MG PO TBDP: 8.0000 mg | ORAL_TABLET | Freq: Two times a day (BID) | ORAL | Status: AC | PRN

## 2012-04-12 MED ORDER — ONDANSETRON HCL 4 MG/2ML IJ SOLN: 4.0000 mg | Freq: Once | INTRAMUSCULAR | Status: DC

## 2012-04-12 MED ORDER — PROMETHAZINE HCL 25 MG PO TABS: 25.0000 mg | ORAL_TABLET | Freq: Four times a day (QID) | ORAL | Status: DC | PRN

## 2012-04-12 MED ORDER — ONDANSETRON 4 MG PO TBDP
8.0000 mg | ORAL_TABLET | Freq: Once | ORAL | Status: AC
  Administered 2012-04-12: 8 mg via ORAL
  Filled 2012-04-12 (×2): qty 1

## 2012-04-12 MED ORDER — HYDROCODONE-ACETAMINOPHEN 5-325 MG PO TABS
1.0000 | ORAL_TABLET | Freq: Once | ORAL | Status: AC
  Administered 2012-04-12: 1 via ORAL
  Filled 2012-04-12: qty 1

## 2012-04-12 MED ORDER — PANTOPRAZOLE SODIUM 40 MG PO TBEC: 40.0000 mg | DELAYED_RELEASE_TABLET | Freq: Every day | ORAL | Status: DC

## 2012-04-12 NOTE — ED Notes (Signed)
Iv team at bedside, attempted x 1 without success. This RN attempted x 1 without success. Pt reports that last 3 ivs were picc lines. [name], [name] made aware.

## 2012-04-12 NOTE — ED Notes (Signed)
Pt informed that pt requesting results and is tolerating PO fluids

## 2012-04-12 NOTE — ED Notes (Signed)
Pt tolerated her po fluids, no vomiting noted. Pt also stated that her abdominal pain is much better.

## 2012-04-12 NOTE — ED Notes (Signed)
IV nurse was unable to start an IV, [facility] was made aware

## 2012-04-12 NOTE — ED Notes (Signed)
Reports not feeling well since last night, having n/v/d. No acute distress noted at triage.

## 2012-04-12 NOTE — Discharge Instructions (Signed)
B.R.A.T. Diet Your doctor has recommended the B.R.A.T. diet for you or your child until the condition improves. This is often used to help control diarrhea and vomiting symptoms. If you or your child can tolerate clear liquids, you may have:  Bananas.   Rice.   Applesauce.   Toast (and other simple starches such as crackers, potatoes, noodles).  Be sure to avoid dairy products, meats, and fatty foods until symptoms are better. Fruit juices such as apple, grape, and prune juice can make diarrhea worse. Avoid these. Continue this diet for 2 days or as instructed by your caregiver. Document Released: 11/22/2005 Document Revised: 11/11/2011 Document Reviewed: 05/11/2007 [facility] Patient Information 2012 [place].    Nausea and Vomiting Nausea is a sick feeling that often comes before throwing up (vomiting). Vomiting is a reflex where stomach contents come out of your mouth. Vomiting can cause severe loss of body fluids (dehydration). Children and elderly adults can become dehydrated quickly, especially if they also have diarrhea. Nausea and vomiting are symptoms of a condition or disease. It is important to find the cause of your symptoms. CAUSES   Direct irritation of the stomach lining. This irritation can result from increased acid production (gastroesophageal reflux disease), infection, food poisoning, taking certain medicines (such as nonsteroidal anti-inflammatory drugs), alcohol use, or tobacco use.   Signals from the brain.These signals could be caused by a headache, heat exposure, an inner ear disturbance, increased pressure in the brain from injury, infection, a tumor, or a concussion, pain, emotional stimulus, or metabolic problems.   An obstruction in the gastrointestinal tract (bowel obstruction).   Illnesses such as diabetes, hepatitis, gallbladder problems, appendicitis, kidney problems, cancer, sepsis, atypical symptoms of a heart attack, or eating disorders.   Medical  treatments such as chemotherapy and radiation.   Receiving medicine that makes you sleep (general anesthetic) during surgery.  DIAGNOSIS Your caregiver may ask for tests to be done if the problems do not improve after a few days. Tests may also be done if symptoms are severe or if the reason for the nausea and vomiting is not clear. Tests may include:  Urine tests.   Blood tests.   Stool tests.   Cultures (to look for evidence of infection).   X-rays or other imaging studies.  Test results can help your caregiver make decisions about treatment or the need for additional tests. TREATMENT You need to stay well hydrated. Drink frequently but in small amounts.You may wish to drink water, sports drinks, clear broth, or eat frozen ice pops or gelatin dessert to help stay hydrated.When you eat, eating slowly may help prevent nausea.There are also some antinausea medicines that may help prevent nausea. HOME CARE INSTRUCTIONS   Take all medicine as directed by your caregiver.   If you do not have an appetite, do not force yourself to eat. However, you must continue to drink fluids.   If you have an appetite, eat a normal diet unless your caregiver tells you differently.   Eat a variety of complex carbohydrates (rice, wheat, potatoes, bread), lean meats, yogurt, fruits, and vegetables.   Avoid high-fat foods because they are more difficult to digest.   Drink enough water and fluids to keep your urine clear or pale yellow.   If you are dehydrated, ask your caregiver for specific rehydration instructions. Signs of dehydration may include:   Severe thirst.   Dry lips and mouth.   Dizziness.   Dark urine.   Decreasing urine frequency and amount.  Confusion.   Rapid breathing or pulse.  SEEK IMMEDIATE MEDICAL CARE IF:   You have blood or brown flecks (like coffee grounds) in your vomit.   You have black or bloody stools.   You have a severe headache or stiff neck.   You  are confused.   You have severe abdominal pain.   You have chest pain or trouble breathing.   You do not urinate at least once every 8 hours.   You develop cold or clammy skin.   You continue to vomit for longer than 24 to 48 hours.   You have a fever.  MAKE SURE YOU:   Understand these instructions.   Will watch your condition.   Will get help right away if you are not doing well or get worse.  Document Released: 11/22/2005 Document Revised: 11/11/2011 Document Reviewed: 04/21/2011 [facility] Patient Information 2012 [place], [place].

## 2012-04-12 NOTE — ED Provider Notes (Signed)
History   This chart was scribed for Saddie Benders. Dorna Mai, MD by Carolyne Littles. The patient was seen in room CDU03/CDU03. Patient's care was started at 1103.    CSN: [id number]  Arrival date & time 04/12/12  1103   First MD Initiated Contact with Patient 04/12/12 1209      Chief Complaint  Patient presents with  . Emesis    (Consider location/radiation/quality/duration/timing/severity/associated sxs/prior treatment) HPI [name] is a 46 y.o. female who presents to the Emergency Department complaining of nausea and vomiting onset yesterday and persistent since. Patient also notes that she began experiencing diffuse abdominal pain just prior to onset of nausea and vomiting. Patient notes having 10 episodes of vomiting since yesterday. Denies chest pain, SOB, dysuria, vaginal bleeding, vaginal d/c, diarrhea, constipation. Patient with h/o diabetes, HTN, anemia, necrotizing fasciitis.   Past Medical History  Diagnosis Date  . Diabetes mellitus type 2, uncontrolled   . Hypertension   . Anemia   . Alcohol dependency   . Diabetes mellitus     type 2  . Necrotizing fasciitis     Past Surgical History  Procedure Date  . Amputation     right first and left middle finger amputation 2/2 OM 08/2011 by Dr. Amedeo Plenty  . Cesarean section   . I&d left thumb 12/23/2011  . Tubal ligation   . I&d extremity 12/24/2011    Procedure: IRRIGATION AND DEBRIDEMENT EXTREMITY;  Surgeon: Tennis Must, MD;  Location: [place];  Service: Orthopedics;  Laterality: Left;  I&Dleft thumb with partial amputation  . I&d extremity 12/26/2011    Procedure: IRRIGATION AND DEBRIDEMENT EXTREMITY;  Surgeon: Tennis Must, MD;  Location: [place];  Service: Orthopedics;  Laterality: Left;  . I&d extremity 12/23/2011    Procedure: IRRIGATION AND DEBRIDEMENT EXTREMITY;  Surgeon: Tennis Must, MD;  Location: [place];  Service: Orthopedics;  Laterality: Left;  I&D Left thumb, hand and arm  . Amputation 01/18/2012    Procedure:  AMPUTATION DIGIT;  Surgeon: Tennis Must, MD;  Location: [place];  Service: Orthopedics;  Laterality: Left;  revision amputation left thumb    Family History  Problem Relation Age of Onset  . Diabetes Mother   . Diabetes Father     History  Substance Use Topics  . Smoking status: Never Smoker   . Smokeless tobacco: Never Used  . Alcohol Use: Yes     h/o alcohol abuse    OB History    Grav Para Term Preterm Abortions TAB SAB Ect Mult Living                  Review of Systems  Constitutional: Positive for appetite change. Negative for fever and chills.  Respiratory: Negative for shortness of breath.   Cardiovascular: Negative for chest pain.  Gastrointestinal: Positive for nausea, vomiting and abdominal pain. Negative for diarrhea, constipation and blood in stool.  Musculoskeletal: Negative for back pain.  Neurological: Negative for weakness.  All other systems reviewed and are negative.    Allergies  Review of patient's allergies indicates no known allergies.  Home Medications   Current Outpatient Rx  Name Route Sig Dispense Refill  . INSULIN ISOPHANE & REGULAR (70-30) 100 UNIT/ML [place] SUSP  Please inject 23 units twice daily, one is before breakfast and another is before dinner. 10 mL 12  . LISINOPRIL 10 MG PO TABS Oral Take 1 tablet (10 mg total) by mouth daily. 30 tablet 1  . METFORMIN HCL 1000  MG PO TABS Oral Take 1 tablet (1,000 mg total) by mouth 2 (two) times daily with a meal. 60 tablet 6    BP 174/90  Pulse 112  Temp(Src) 100.8 F (38.2 C) (Oral)  Resp 20  SpO2 95%  LMP 04/12/2012  Physical Exam  Nursing note and vitals reviewed. Constitutional: She is oriented to person, place, and time. She appears well-developed and well-nourished.  Non-toxic appearance. She does not have a sickly appearance. She does not appear ill. No distress.       Yellow-green emesis recognized at bedside.   HENT:  Head: Normocephalic and atraumatic.  Eyes:  EOM are normal. Pupils are equal, round, and reactive to light.  Neck: Neck supple. No tracheal deviation present.  Cardiovascular: Normal rate.   Pulmonary/Chest: Effort normal. No respiratory distress. She has no wheezes.  Abdominal: Soft. Bowel sounds are normal. She exhibits no shifting dullness and no distension. There is no tenderness. There is no rebound, no guarding, no CVA tenderness, no tenderness at McBurney's point and negative Murphy's sign.  Musculoskeletal: Normal range of motion. She exhibits no edema.  Neurological: She is alert and oriented to person, place, and time. No sensory deficit.  Skin: Skin is warm and dry. No rash noted.  Psychiatric: She has a normal mood and affect. Her behavior is normal.    ED Course  Procedures (including critical care time)  DIAGNOSTIC STUDIES: Oxygen Saturation is 98% on room air, normal by my interpretation.    COORDINATION OF CARE: 12:23PM-Patient informed of current plan for treatment and evaluation and agrees with plan at this time.     Labs Reviewed  CBC - Abnormal; Notable for the following:    WBC 22.5 (*)    Hemoglobin 9.3 (*)    HCT 29.3 (*)    MCV 71.5 (*)    MCH 22.7 (*)    Platelets 426 (*)    All other components within normal limits  DIFFERENTIAL - Abnormal; Notable for the following:    Neutrophils Relative 95 (*)    Lymphocytes Relative 3 (*)    Monocytes Relative 2 (*)    Neutro Abs 21.3 (*)    All other components within normal limits  COMPREHENSIVE METABOLIC PANEL - Abnormal; Notable for the following:    Sodium 133 (*)    Glucose, Bld 222 (*)    Albumin 2.5 (*)    Alkaline Phosphatase 131 (*)    All other components within normal limits  URINALYSIS, ROUTINE W REFLEX MICROSCOPIC - Abnormal; Notable for the following:    Glucose, UA 500 (*)    Hgb urine dipstick LARGE (*)    Protein, ur >300 (*)    All other components within normal limits  GLUCOSE, CAPILLARY - Abnormal; Notable for the following:      Glucose-Capillary 208 (*)    All other components within normal limits  LIPASE, BLOOD  URINE MICROSCOPIC-ADD ON   Dg Abd Acute W/chest  04/12/2012  *RADIOLOGY REPORT*  Clinical Data: Vomiting with nausea.  ACUTE ABDOMEN SERIES (ABDOMEN 2 VIEW & CHEST 1 VIEW)  Comparison: Chest x-ray from 01/07/2012  Findings: The cardiopericardial silhouette is enlarged. There is pulmonary vascular congestion without overt pulmonary edema. Subsegmental atelectasis noted at the left lung base.  Upright film shows no evidence for intraperitoneal free air. Supine film shows a paucity of bowel gas.  The liver appears enlarged.  Phleboliths are seen over the inferior anatomic pelvis. Visualized bony structures are unremarkable.  IMPRESSION: Cardiomegaly with vascular  congestion.  Left base atelectasis.  No evidence for bowel perforation or obstruction.  Probable hepatomegaly.  Original Report Authenticated By: ERIC A. MANSELL, M.D.     No diagnosis found.  2:29 PM RN staff unable to get IV.  Pt has continue to vomit despite PO zofran ODT.  Pt does not appear toxic however, has been able to ambulate in room and hallway in no apparent distress.  Abd again is soft, non tender . Glucose is mildly elevated, but anion gap is 13 and CO2 is normal on chemistry panel.  Not in DKA.  Will give IM phenergan and try to orally rehydrate and give oral meds.  If unable to, will admit and place [facility] for continued medications.    8:05 PM Pt is no longer vomiting, no UTI.  Plain films shows no infiltrate.  I think leukocyctosis is reactive, but not indicative of obv infection.  Clinicaly improvement, will d/c home with prescriptions.     MDM  I personally performed the services described in this documentation, which was scribed in my presence. The recorded information has been reviewed and considered.   12:30 PM Pt with N/V, she is a brittle diabetic, h/o alcoholism which pt denies to me drinking recently.  I reviewed pt's prior  records.  Pt has had many digit amputations due to infection likely due to poorly controlled diabetes.  No sig pain or tenderness here.  Will give IVF's, antiemetics, protonix and check labs for now to assess for anion gap and measure glucose.  No need for imaging at this time.  Will place in CDU holding/observation pending labs and recheck.     Saddie Benders. Dorna Mai, MD 04/12/12 2009

## 2012-08-20 ENCOUNTER — Encounter (HOSPITAL_COMMUNITY): Payer: Self-pay | Admitting: Emergency Medicine

## 2012-08-20 ENCOUNTER — Inpatient Hospital Stay (HOSPITAL_COMMUNITY)
Admission: EM | Admit: 2012-08-20 | Discharge: 2012-08-30 | DRG: 240 | Disposition: A | Discharge status: Skilled Nursing Facility | Payer: Medicaid Other | Attending: Internal Medicine | Admitting: Internal Medicine

## 2012-08-20 ENCOUNTER — Emergency Department (HOSPITAL_COMMUNITY): Payer: Medicaid Other

## 2012-08-20 DIAGNOSIS — IMO0002 Reserved for concepts with insufficient information to code with codable children: Secondary | ICD-10-CM

## 2012-08-20 DIAGNOSIS — I872 Venous insufficiency (chronic) (peripheral): Secondary | ICD-10-CM | POA: Diagnosis present

## 2012-08-20 DIAGNOSIS — Z1881 Retained glass fragments: Secondary | ICD-10-CM

## 2012-08-20 DIAGNOSIS — E1169 Type 2 diabetes mellitus with other specified complication: Secondary | ICD-10-CM | POA: Diagnosis present

## 2012-08-20 DIAGNOSIS — X58XXXA Exposure to other specified factors, initial encounter: Secondary | ICD-10-CM | POA: Diagnosis present

## 2012-08-20 DIAGNOSIS — D638 Anemia in other chronic diseases classified elsewhere: Secondary | ICD-10-CM | POA: Diagnosis present

## 2012-08-20 DIAGNOSIS — E876 Hypokalemia: Secondary | ICD-10-CM

## 2012-08-20 DIAGNOSIS — L97409 Non-pressure chronic ulcer of unspecified heel and midfoot with unspecified severity: Secondary | ICD-10-CM | POA: Diagnosis present

## 2012-08-20 DIAGNOSIS — Z91199 Patient's noncompliance with other medical treatment and regimen due to unspecified reason: Secondary | ICD-10-CM

## 2012-08-20 DIAGNOSIS — R1084 Generalized abdominal pain: Secondary | ICD-10-CM | POA: Diagnosis present

## 2012-08-20 DIAGNOSIS — E46 Unspecified protein-calorie malnutrition: Secondary | ICD-10-CM | POA: Diagnosis present

## 2012-08-20 DIAGNOSIS — Z9119 Patient's noncompliance with other medical treatment and regimen: Secondary | ICD-10-CM

## 2012-08-20 DIAGNOSIS — E1165 Type 2 diabetes mellitus with hyperglycemia: Secondary | ICD-10-CM | POA: Diagnosis present

## 2012-08-20 DIAGNOSIS — E1142 Type 2 diabetes mellitus with diabetic polyneuropathy: Secondary | ICD-10-CM | POA: Diagnosis present

## 2012-08-20 DIAGNOSIS — F1020 Alcohol dependence, uncomplicated: Secondary | ICD-10-CM

## 2012-08-20 DIAGNOSIS — D508 Other iron deficiency anemias: Secondary | ICD-10-CM | POA: Diagnosis present

## 2012-08-20 DIAGNOSIS — N179 Acute kidney failure, unspecified: Secondary | ICD-10-CM

## 2012-08-20 DIAGNOSIS — E119 Type 2 diabetes mellitus without complications: Secondary | ICD-10-CM | POA: Diagnosis present

## 2012-08-20 DIAGNOSIS — S81819A Laceration without foreign body, unspecified lower leg, initial encounter: Secondary | ICD-10-CM

## 2012-08-20 DIAGNOSIS — S81809A Unspecified open wound, unspecified lower leg, initial encounter: Secondary | ICD-10-CM | POA: Diagnosis present

## 2012-08-20 DIAGNOSIS — I159 Secondary hypertension, unspecified: Secondary | ICD-10-CM | POA: Diagnosis present

## 2012-08-20 DIAGNOSIS — M86679 Other chronic osteomyelitis, unspecified ankle and foot: Secondary | ICD-10-CM | POA: Diagnosis present

## 2012-08-20 DIAGNOSIS — K5900 Constipation, unspecified: Secondary | ICD-10-CM | POA: Diagnosis not present

## 2012-08-20 DIAGNOSIS — L03119 Cellulitis of unspecified part of limb: Secondary | ICD-10-CM | POA: Diagnosis present

## 2012-08-20 DIAGNOSIS — I10 Essential (primary) hypertension: Secondary | ICD-10-CM

## 2012-08-20 DIAGNOSIS — B965 Pseudomonas (aeruginosa) (mallei) (pseudomallei) as the cause of diseases classified elsewhere: Secondary | ICD-10-CM | POA: Diagnosis present

## 2012-08-20 DIAGNOSIS — E11621 Type 2 diabetes mellitus with foot ulcer: Secondary | ICD-10-CM

## 2012-08-20 DIAGNOSIS — L089 Local infection of the skin and subcutaneous tissue, unspecified: Secondary | ICD-10-CM

## 2012-08-20 DIAGNOSIS — Y929 Unspecified place or not applicable: Secondary | ICD-10-CM

## 2012-08-20 DIAGNOSIS — L02619 Cutaneous abscess of unspecified foot: Secondary | ICD-10-CM

## 2012-08-20 DIAGNOSIS — R197 Diarrhea, unspecified: Secondary | ICD-10-CM | POA: Diagnosis present

## 2012-08-20 DIAGNOSIS — D72829 Elevated white blood cell count, unspecified: Secondary | ICD-10-CM

## 2012-08-20 DIAGNOSIS — E1149 Type 2 diabetes mellitus with other diabetic neurological complication: Secondary | ICD-10-CM | POA: Diagnosis present

## 2012-08-20 DIAGNOSIS — M9080 Osteopathy in diseases classified elsewhere, unspecified site: Secondary | ICD-10-CM | POA: Diagnosis present

## 2012-08-20 DIAGNOSIS — L97509 Non-pressure chronic ulcer of other part of unspecified foot with unspecified severity: Secondary | ICD-10-CM | POA: Diagnosis present

## 2012-08-20 DIAGNOSIS — E875 Hyperkalemia: Secondary | ICD-10-CM

## 2012-08-20 DIAGNOSIS — E11628 Type 2 diabetes mellitus with other skin complications: Secondary | ICD-10-CM

## 2012-08-20 DIAGNOSIS — D649 Anemia, unspecified: Secondary | ICD-10-CM

## 2012-08-20 DIAGNOSIS — E1159 Type 2 diabetes mellitus with other circulatory complications: Principal | ICD-10-CM | POA: Diagnosis present

## 2012-08-20 DIAGNOSIS — S81009A Unspecified open wound, unspecified knee, initial encounter: Secondary | ICD-10-CM | POA: Diagnosis present

## 2012-08-20 DIAGNOSIS — A498 Other bacterial infections of unspecified site: Secondary | ICD-10-CM | POA: Diagnosis present

## 2012-08-20 DIAGNOSIS — R1115 Cyclical vomiting syndrome unrelated to migraine: Secondary | ICD-10-CM | POA: Diagnosis present

## 2012-08-20 DIAGNOSIS — F1010 Alcohol abuse, uncomplicated: Secondary | ICD-10-CM

## 2012-08-20 DIAGNOSIS — Z833 Family history of diabetes mellitus: Secondary | ICD-10-CM

## 2012-08-20 DIAGNOSIS — Z794 Long term (current) use of insulin: Secondary | ICD-10-CM

## 2012-08-20 DIAGNOSIS — R112 Nausea with vomiting, unspecified: Secondary | ICD-10-CM

## 2012-08-20 LAB — CBC WITH DIFFERENTIAL/PLATELET
Basophils Absolute: 0.1 10*3/uL (ref 0.0–0.1)
Basophils Relative: 0 % (ref 0–1)
Eosinophils Absolute: 0.1 10*3/uL (ref 0.0–0.7)
Eosinophils Relative: 0 % (ref 0–5)
HCT: 27.7 % — ABNORMAL LOW (ref 36.0–46.0)
Hemoglobin: 8.7 g/dL — ABNORMAL LOW (ref 12.0–15.0)
Lymphocytes Relative: 3 % — ABNORMAL LOW (ref 12–46)
Lymphs Abs: 0.8 10*3/uL (ref 0.7–4.0)
MCH: 23.1 pg — ABNORMAL LOW (ref 26.0–34.0)
MCHC: 31.4 g/dL (ref 30.0–36.0)
MCV: 73.5 fL — ABNORMAL LOW (ref 78.0–100.0)
Monocytes Absolute: 1.7 10*3/uL — ABNORMAL HIGH (ref 0.1–1.0)
Monocytes Relative: 7 % (ref 3–12)
Neutro Abs: 21.8 10*3/uL — ABNORMAL HIGH (ref 1.7–7.7)
Neutrophils Relative %: 89 % — ABNORMAL HIGH (ref 43–77)
Platelets: 401 10*3/uL — ABNORMAL HIGH (ref 150–400)
RBC: 3.77 MIL/uL — ABNORMAL LOW (ref 3.87–5.11)
RDW: 14.4 % (ref 11.5–15.5)
WBC: 24.4 10*3/uL — ABNORMAL HIGH (ref 4.0–10.5)

## 2012-08-20 LAB — BASIC METABOLIC PANEL
BUN: 21 mg/dL (ref 6–23)
CO2: 17 mEq/L — ABNORMAL LOW (ref 19–32)
Calcium: 8.9 mg/dL (ref 8.4–10.5)
Chloride: 96 mEq/L (ref 96–112)
Creatinine, Ser: 1.24 mg/dL — ABNORMAL HIGH (ref 0.50–1.10)
GFR calc Af Amer: 60 mL/min — ABNORMAL LOW (ref >90)
GFR calc non Af Amer: 52 mL/min — ABNORMAL LOW (ref >90)
Glucose, Bld: 216 mg/dL — ABNORMAL HIGH (ref 70–99)
Potassium: 4.3 mEq/L (ref 3.5–5.1)
Sodium: 130 mEq/L — ABNORMAL LOW (ref 135–145)

## 2012-08-20 LAB — POCT I-STAT 3, VENOUS BLOOD GAS (G3P V)
Acid-base deficit: 9 mmol/L — ABNORMAL HIGH (ref 0.0–2.0)
Bicarbonate: 17.1 mEq/L — ABNORMAL LOW (ref 20.0–24.0)
O2 Saturation: 51 %
TCO2: 18 mmol/L (ref 0–100)
pCO2, Ven: 35.5 mmHg — ABNORMAL LOW (ref 45.0–50.0)
pH, Ven: 7.29 (ref 7.250–7.300)
pO2, Ven: 30 mmHg (ref 30.0–45.0)

## 2012-08-20 LAB — LACTIC ACID, PLASMA: Lactic Acid, Venous: 1.4 mmol/L (ref 0.5–2.2)

## 2012-08-20 LAB — GLUCOSE, CAPILLARY: Glucose-Capillary: 195 mg/dL — ABNORMAL HIGH (ref 70–99)

## 2012-08-20 MED ORDER — ACETAMINOPHEN 325 MG PO TABS: 650.0000 mg | ORAL_TABLET | Freq: Once | ORAL | Status: DC

## 2012-08-20 MED ORDER — ONDANSETRON 4 MG PO TBDP
ORAL_TABLET | ORAL | Status: AC
  Administered 2012-08-20: 8 mg via ORAL
  Filled 2012-08-20: qty 2

## 2012-08-20 MED ORDER — METOCLOPRAMIDE HCL 5 MG/ML IJ SOLN
10.0000 mg | Freq: Once | INTRAMUSCULAR | Status: AC
  Administered 2012-08-20: 10 mg via INTRAVENOUS
  Filled 2012-08-20: qty 2

## 2012-08-20 MED ORDER — SODIUM CHLORIDE 0.9 % IV BOLUS (SEPSIS)
1000.0000 mL | Freq: Once | INTRAVENOUS | Status: AC
  Administered 2012-08-20: 1000 mL via INTRAVENOUS

## 2012-08-20 MED ORDER — ACETAMINOPHEN 325 MG PO TABS
ORAL_TABLET | ORAL | Status: AC
  Filled 2012-08-20: qty 2

## 2012-08-20 MED ORDER — ONDANSETRON 4 MG PO TBDP
8.0000 mg | ORAL_TABLET | Freq: Once | ORAL | Status: AC
  Administered 2012-08-20: 8 mg via ORAL

## 2012-08-20 NOTE — ED Notes (Signed)
PT reports a week HX of N/V/D.

## 2012-08-20 NOTE — ED Provider Notes (Signed)
History     CSN: [id number]  Arrival date & time 08/20/12  1725   First MD Initiated Contact with Patient 08/20/12 2202      Chief Complaint  Patient presents with  . Diarrhea  . Nausea  . Weakness    (Consider location/radiation/quality/duration/timing/severity/associated sxs/prior treatment) Patient is a 46 y.o. female presenting with diarrhea, weakness, and vomiting. The history is provided by the patient and a relative. No language interpreter was used.  Diarrhea The primary symptoms include fever, fatigue, nausea, vomiting and diarrhea. Primary symptoms do not include abdominal pain or dysuria. The onset was gradual. The problem has been gradually worsening.  The illness is also significant for chills.  Weakness The primary symptoms include fever, nausea and vomiting.  Additional symptoms include weakness. Additional symptoms do not include neck stiffness.  Emesis  This is a recurrent problem. The current episode started more than 1 week ago. The problem occurs 5 to 10 times per day. The problem has been gradually worsening. The emesis has an appearance of stomach contents. The maximum temperature recorded prior to her arrival was 102 to 102.9 F. Associated symptoms include chills, diarrhea and a fever. Pertinent negatives include no abdominal pain and no cough.   46 year old female coming in with complaint of nausea vomiting and diarrhea, fever intermittent x 1 week. Patient appears sickly with multiple venous stasis ulcers to her lower extremities in the bottom of her right foot that are foul-smelling but do not have any drainage. Patient denies any antibiotics in the last 3 months. Unable to take her regular medication x3 days because of the vomiting. States she also has some diffuse abdominal pain. Accu-Chek was 195. Patient has a past medical history of diabetes, hypertension, anemia, necrotizing fasciitis with amputation of 2 digits.    Past Medical History  Diagnosis Date    . Diabetes mellitus type 2, uncontrolled   . Hypertension   . Anemia   . Alcohol dependency   . Diabetes mellitus     type 2  . Necrotizing fasciitis     Past Surgical History  Procedure Date  . Amputation     right first and left middle finger amputation 2/2 OM 08/2011 by Dr. Amedeo Plenty  . Cesarean section   . I&d left thumb 12/23/2011  . Tubal ligation   . I&d extremity 12/24/2011    Procedure: IRRIGATION AND DEBRIDEMENT EXTREMITY;  Surgeon: Tennis Must, MD;  Location: [place];  Service: Orthopedics;  Laterality: Left;  I&Dleft thumb with partial amputation  . I&d extremity 12/26/2011    Procedure: IRRIGATION AND DEBRIDEMENT EXTREMITY;  Surgeon: Tennis Must, MD;  Location: [place];  Service: Orthopedics;  Laterality: Left;  . I&d extremity 12/23/2011    Procedure: IRRIGATION AND DEBRIDEMENT EXTREMITY;  Surgeon: Tennis Must, MD;  Location: [place];  Service: Orthopedics;  Laterality: Left;  I&D Left thumb, hand and arm  . Amputation 01/18/2012    Procedure: AMPUTATION DIGIT;  Surgeon: Tennis Must, MD;  Location: [place];  Service: Orthopedics;  Laterality: Left;  revision amputation left thumb    Family History  Problem Relation Age of Onset  . Diabetes Mother   . Diabetes Father     History  Substance Use Topics  . Smoking status: Never Smoker   . Smokeless tobacco: Never Used  . Alcohol Use: Yes     h/o alcohol abuse    OB History    Grav Para Term Preterm Abortions TAB SAB Ect  Mult Living                  Review of Systems  Constitutional: Positive for fever, chills and fatigue.  HENT: Negative.  Negative for neck pain and neck stiffness.   Eyes: Negative.   Respiratory: Negative.  Negative for cough and shortness of breath.   Cardiovascular: Negative.  Negative for chest pain.  Gastrointestinal: Positive for nausea, vomiting and diarrhea. Negative for abdominal pain.  Genitourinary: Negative.  Negative for dysuria.  Musculoskeletal: Negative.   Negative for gait problem.  Skin: Negative.   Neurological: Positive for weakness.  Psychiatric/Behavioral: Negative.   All other systems reviewed and are negative.    Allergies  Review of patient's allergies indicates no known allergies.  Home Medications   Current Outpatient Rx  Name Route Sig Dispense Refill  . INSULIN ISOPHANE & REGULAR (70-30) 100 UNIT/ML [place] SUSP Subcutaneous Inject 23 Units into the skin 2 (two) times daily with a meal.    . LISINOPRIL 10 MG PO TABS Oral Take 1 tablet (10 mg total) by mouth daily. 30 tablet 1  . METFORMIN HCL 1000 MG PO TABS Oral Take 1 tablet (1,000 mg total) by mouth 2 (two) times daily with a meal. 60 tablet 6    BP 186/73  Pulse 98  Temp 98.5 F (36.9 C) (Oral)  Resp 19  SpO2 98%  LMP 08/13/2012  Physical Exam  Nursing note and vitals reviewed. Constitutional: She is oriented to person, place, and time. She appears well-developed and well-nourished.  HENT:  Head: Normocephalic and atraumatic.  Eyes: Conjunctivae normal and EOM are normal. Pupils are equal, round, and reactive to light.  Neck: Normal range of motion. Neck supple.  Cardiovascular: Normal rate, regular rhythm and intact distal pulses.  Exam reveals no friction rub.   Pulmonary/Chest: Effort normal and breath sounds normal. No respiratory distress.  Abdominal: Soft. Bowel sounds are normal. She exhibits no distension. There is no tenderness.  Musculoskeletal: Normal range of motion. She exhibits no edema and no tenderness.  Neurological: She is alert and oriented to person, place, and time. She has normal reflexes.  Skin: Skin is warm and dry.       Decubitus ulsers  to bilateral LE and bottom of R foot.  3 on th RLE and 4 to the [place].  Psychiatric: She has a normal mood and affect.    ED Course  Procedures (including critical care time)  Labs Reviewed  GLUCOSE, CAPILLARY - Abnormal; Notable for the following:    Glucose-Capillary 195 (*)     All other  components within normal limits  CBC WITH DIFFERENTIAL - Abnormal; Notable for the following:    WBC 24.4 (*)     RBC 3.77 (*)     Hemoglobin 8.7 (*)     HCT 27.7 (*)     MCV 73.5 (*)     MCH 23.1 (*)     Platelets 401 (*)     Neutrophils Relative 89 (*)     Neutro Abs 21.8 (*)     Lymphocytes Relative 3 (*)     Monocytes Absolute 1.7 (*)     All other components within normal limits  BASIC METABOLIC PANEL - Abnormal; Notable for the following:    Sodium 130 (*)     CO2 17 (*)     Glucose, Bld 216 (*)     Creatinine, Ser 1.24 (*)     GFR calc non Af Amer 52 (*)  GFR calc Af Amer 60 (*)     All other components within normal limits  CLOSTRIDIUM DIFFICILE BY PCR  URINALYSIS, ROUTINE W REFLEX MICROSCOPIC  BLOOD GAS, VENOUS  CULTURE, BLOOD (ROUTINE X 2)  CULTURE, BLOOD (ROUTINE X 2)  LACTIC ACID, PLASMA   No results found.   No diagnosis found.    MDM  46 year old patient's female coming in with nausea vomiting diarrhea times past week. White blood cell count 24, glucose 195, sodium is 130. C. difficile is pending. Temp 102. Blood Cultures pending x2. Patient is tachycardic. Blood pressure is 130s to 180s over 70s to 80s. O2 sats 97-100. Diffuse abdominal pain. Plain film of the bottom of the right foot shows no osteomyelitis. Patient will be admitted to hospitalist team.  Labs Reviewed  GLUCOSE, CAPILLARY - Abnormal; Notable for the following:    Glucose-Capillary 195 (*)     All other components within normal limits  CBC WITH DIFFERENTIAL - Abnormal; Notable for the following:    WBC 24.4 (*)     RBC 3.77 (*)     Hemoglobin 8.7 (*)     HCT 27.7 (*)     MCV 73.5 (*)     MCH 23.1 (*)     Platelets 401 (*)     Neutrophils Relative 89 (*)     Neutro Abs 21.8 (*)     Lymphocytes Relative 3 (*)     Monocytes Absolute 1.7 (*)     All other components within normal limits  BASIC METABOLIC PANEL - Abnormal; Notable for the following:    Sodium 130 (*)     CO2 17 (*)      Glucose, Bld 216 (*)     Creatinine, Ser 1.24 (*)     GFR calc non Af Amer 52 (*)     GFR calc Af Amer 60 (*)     All other components within normal limits  POCT I-STAT 3, BLOOD GAS (G3P V) - Abnormal; Notable for the following:    pCO2, Ven 35.5 (*)     Bicarbonate 17.1 (*)     Acid-base deficit 9.0 (*)     All other components within normal limits  LACTIC ACID, PLASMA  PROCALCITONIN  CLOSTRIDIUM DIFFICILE BY PCR  URINALYSIS, ROUTINE W REFLEX MICROSCOPIC  BLOOD GAS, VENOUS  CULTURE, BLOOD (ROUTINE X 2)  CULTURE, BLOOD (ROUTINE X 2)          Julieta Bellini, NP 08/21/12 [phone]

## 2012-08-20 NOTE — ED Notes (Signed)
Advised of the wait x4.  Pt very tired of waiting

## 2012-08-20 NOTE — ED Notes (Signed)
Introduced myself to pt and advised her that she would be coming to room as soon as room is cleaned.

## 2012-08-21 ENCOUNTER — Encounter (HOSPITAL_COMMUNITY): Payer: Self-pay | Admitting: *Deleted

## 2012-08-21 ENCOUNTER — Inpatient Hospital Stay (HOSPITAL_COMMUNITY): Payer: Medicaid Other

## 2012-08-21 DIAGNOSIS — L97509 Non-pressure chronic ulcer of other part of unspecified foot with unspecified severity: Secondary | ICD-10-CM

## 2012-08-21 DIAGNOSIS — S81809A Unspecified open wound, unspecified lower leg, initial encounter: Secondary | ICD-10-CM

## 2012-08-21 DIAGNOSIS — E11621 Type 2 diabetes mellitus with foot ulcer: Secondary | ICD-10-CM | POA: Diagnosis present

## 2012-08-21 DIAGNOSIS — E1169 Type 2 diabetes mellitus with other specified complication: Secondary | ICD-10-CM

## 2012-08-21 DIAGNOSIS — R112 Nausea with vomiting, unspecified: Secondary | ICD-10-CM | POA: Diagnosis present

## 2012-08-21 DIAGNOSIS — S81009A Unspecified open wound, unspecified knee, initial encounter: Secondary | ICD-10-CM

## 2012-08-21 DIAGNOSIS — D72829 Elevated white blood cell count, unspecified: Secondary | ICD-10-CM | POA: Diagnosis present

## 2012-08-21 LAB — CBC
HCT: 24.9 % — ABNORMAL LOW (ref 36.0–46.0)
Hemoglobin: 7.9 g/dL — ABNORMAL LOW (ref 12.0–15.0)
MCH: 22.9 pg — ABNORMAL LOW (ref 26.0–34.0)
MCHC: 31.7 g/dL (ref 30.0–36.0)
MCV: 72.2 fL — ABNORMAL LOW (ref 78.0–100.0)
Platelets: 424 10*3/uL — ABNORMAL HIGH (ref 150–400)
RBC: 3.45 MIL/uL — ABNORMAL LOW (ref 3.87–5.11)
RDW: 14.5 % (ref 11.5–15.5)
WBC: 30.4 10*3/uL — ABNORMAL HIGH (ref 4.0–10.5)

## 2012-08-21 LAB — BASIC METABOLIC PANEL
BUN: 27 mg/dL — ABNORMAL HIGH (ref 6–23)
CO2: 19 mEq/L (ref 19–32)
Calcium: 8.4 mg/dL (ref 8.4–10.5)
Chloride: 95 mEq/L — ABNORMAL LOW (ref 96–112)
Creatinine, Ser: 1.56 mg/dL — ABNORMAL HIGH (ref 0.50–1.10)
GFR calc Af Amer: 46 mL/min — ABNORMAL LOW (ref >90)
GFR calc non Af Amer: 39 mL/min — ABNORMAL LOW (ref >90)
Glucose, Bld: 372 mg/dL — ABNORMAL HIGH (ref 70–99)
Potassium: 3.9 mEq/L (ref 3.5–5.1)
Sodium: 129 mEq/L — ABNORMAL LOW (ref 135–145)

## 2012-08-21 LAB — URINE MICROSCOPIC-ADD ON

## 2012-08-21 LAB — URINALYSIS, ROUTINE W REFLEX MICROSCOPIC
Glucose, UA: 500 mg/dL — AB
Ketones, ur: 40 mg/dL — AB
Leukocytes, UA: NEGATIVE
Nitrite: NEGATIVE
Protein, ur: >300 mg/dL — AB
Specific Gravity, Urine: 1.026 (ref 1.005–1.030)
Urobilinogen, UA: 1 mg/dL (ref 0.0–1.0)
pH: 5.5 (ref 5.0–8.0)

## 2012-08-21 LAB — GLUCOSE, CAPILLARY
Glucose-Capillary: 412 mg/dL — ABNORMAL HIGH (ref 70–99)
Glucose-Capillary: 244 mg/dL — ABNORMAL HIGH (ref 70–99)
Glucose-Capillary: 174 mg/dL — ABNORMAL HIGH (ref 70–99)
Glucose-Capillary: 168 mg/dL — ABNORMAL HIGH (ref 70–99)

## 2012-08-21 LAB — PROCALCITONIN: Procalcitonin: 1.46 ng/mL

## 2012-08-21 LAB — CLOSTRIDIUM DIFFICILE BY PCR: Toxigenic C. Difficile by PCR: NEGATIVE

## 2012-08-21 MED ORDER — THIAMINE HCL 100 MG/ML IJ SOLN
100.0000 mg | Freq: Every day | INTRAMUSCULAR | Status: DC
  Filled 2012-08-21 (×10): qty 1

## 2012-08-21 MED ORDER — LISINOPRIL 10 MG PO TABS
10.0000 mg | ORAL_TABLET | Freq: Every day | ORAL | Status: DC
  Administered 2012-08-21: 10 mg via ORAL
  Filled 2012-08-21: qty 1

## 2012-08-21 MED ORDER — VITAMIN B-1 100 MG PO TABS
100.0000 mg | ORAL_TABLET | Freq: Every day | ORAL | Status: DC
  Administered 2012-08-21 – 2012-08-30 (×9): 100 mg via ORAL
  Filled 2012-08-21 (×10): qty 1

## 2012-08-21 MED ORDER — SODIUM CHLORIDE 0.9 % IV SOLN
INTRAVENOUS | Status: DC
  Administered 2012-08-21: 12:00:00 via INTRAVENOUS
  Administered 2012-08-21: 75 mL/h via INTRAVENOUS

## 2012-08-21 MED ORDER — HYDROCODONE-ACETAMINOPHEN 5-325 MG PO TABS
1.0000 | ORAL_TABLET | ORAL | Status: DC | PRN
  Administered 2012-08-25 – 2012-08-27 (×3): 2 via ORAL
  Filled 2012-08-21 (×5): qty 2

## 2012-08-21 MED ORDER — DEXTROSE 5 % IV SOLN
1.0000 g | INTRAVENOUS | Status: DC
  Administered 2012-08-21 – 2012-08-22 (×2): 1 g via INTRAVENOUS
  Filled 2012-08-21 (×2): qty 10

## 2012-08-21 MED ORDER — INFLUENZA VIRUS VACC SPLIT PF IM SUSP: 0.5000 mL | INTRAMUSCULAR | Status: DC | PRN

## 2012-08-21 MED ORDER — LORAZEPAM 2 MG/ML IJ SOLN: 1.0000 mg | Freq: Four times a day (QID) | INTRAMUSCULAR | Status: AC | PRN

## 2012-08-21 MED ORDER — INSULIN ASPART 100 UNIT/ML ~~LOC~~ SOLN: 0.0000 [IU] | Freq: Three times a day (TID) | SUBCUTANEOUS | Status: DC

## 2012-08-21 MED ORDER — HEPARIN SODIUM (PORCINE) 5000 UNIT/ML IJ SOLN
5000.0000 [IU] | Freq: Three times a day (TID) | INTRAMUSCULAR | Status: DC
  Administered 2012-08-21 – 2012-08-30 (×26): 5000 [IU] via SUBCUTANEOUS
  Filled 2012-08-21 (×31): qty 1

## 2012-08-21 MED ORDER — SENNOSIDES-DOCUSATE SODIUM 8.6-50 MG PO TABS
1.0000 | ORAL_TABLET | Freq: Every evening | ORAL | Status: DC | PRN
  Administered 2012-08-29: 1 via ORAL
  Filled 2012-08-21: qty 1

## 2012-08-21 MED ORDER — ZOLPIDEM TARTRATE 5 MG PO TABS: 5.0000 mg | ORAL_TABLET | Freq: Every evening | ORAL | Status: DC | PRN

## 2012-08-21 MED ORDER — LORAZEPAM 1 MG PO TABS: 0.0000 mg | ORAL_TABLET | Freq: Two times a day (BID) | ORAL | Status: AC

## 2012-08-21 MED ORDER — LORAZEPAM 1 MG PO TABS: 1.0000 mg | ORAL_TABLET | Freq: Four times a day (QID) | ORAL | Status: AC | PRN

## 2012-08-21 MED ORDER — INSULIN ASPART 100 UNIT/ML ~~LOC~~ SOLN: 0.0000 [IU] | Freq: Every day | SUBCUTANEOUS | Status: DC

## 2012-08-21 MED ORDER — ONDANSETRON HCL 4 MG/2ML IJ SOLN
4.0000 mg | Freq: Four times a day (QID) | INTRAMUSCULAR | Status: DC | PRN
  Administered 2012-08-26: 4 mg via INTRAVENOUS
  Filled 2012-08-21: qty 2

## 2012-08-21 MED ORDER — INSULIN ASPART PROT & ASPART (70-30 MIX) 100 UNIT/ML ~~LOC~~ SUSP
23.0000 [IU] | Freq: Two times a day (BID) | SUBCUTANEOUS | Status: DC
  Administered 2012-08-21 – 2012-08-27 (×12): 23 [IU] via SUBCUTANEOUS
  Filled 2012-08-21: qty 3

## 2012-08-21 MED ORDER — INSULIN ASPART 100 UNIT/ML ~~LOC~~ SOLN
0.0000 [IU] | Freq: Every day | SUBCUTANEOUS | Status: DC
  Administered 2012-08-27 – 2012-08-29 (×2): 2 [IU] via SUBCUTANEOUS

## 2012-08-21 MED ORDER — FOLIC ACID 1 MG PO TABS
1.0000 mg | ORAL_TABLET | Freq: Every day | ORAL | Status: DC
  Administered 2012-08-21 – 2012-08-30 (×9): 1 mg via ORAL
  Filled 2012-08-21 (×10): qty 1

## 2012-08-21 MED ORDER — ACETAMINOPHEN 325 MG PO TABS: 650.0000 mg | ORAL_TABLET | Freq: Four times a day (QID) | ORAL | Status: DC | PRN

## 2012-08-21 MED ORDER — VANCOMYCIN HCL IN DEXTROSE 1-5 GM/200ML-% IV SOLN
1000.0000 mg | Freq: Two times a day (BID) | INTRAVENOUS | Status: DC
  Administered 2012-08-21: 1000 mg via INTRAVENOUS
  Filled 2012-08-21 (×2): qty 200

## 2012-08-21 MED ORDER — METFORMIN HCL 500 MG PO TABS
1000.0000 mg | ORAL_TABLET | Freq: Two times a day (BID) | ORAL | Status: DC
  Administered 2012-08-21: 1000 mg via ORAL
  Filled 2012-08-21 (×3): qty 2

## 2012-08-21 MED ORDER — LORAZEPAM 1 MG PO TABS: 0.0000 mg | ORAL_TABLET | Freq: Four times a day (QID) | ORAL | Status: AC

## 2012-08-21 MED ORDER — INFLUENZA VIRUS VACC SPLIT PF IM SUSP
0.5000 mL | INTRAMUSCULAR | Status: AC
  Filled 2012-08-21: qty 0.5

## 2012-08-21 MED ORDER — ALUM & MAG HYDROXIDE-SIMETH 200-200-20 MG/5ML PO SUSP: 30.0000 mL | Freq: Four times a day (QID) | ORAL | Status: DC | PRN

## 2012-08-21 MED ORDER — VANCOMYCIN HCL IN DEXTROSE 1-5 GM/200ML-% IV SOLN
1000.0000 mg | INTRAVENOUS | Status: DC
  Administered 2012-08-22 – 2012-08-25 (×4): 1000 mg via INTRAVENOUS
  Filled 2012-08-21 (×4): qty 200

## 2012-08-21 MED ORDER — ADULT MULTIVITAMIN W/MINERALS CH
1.0000 | ORAL_TABLET | Freq: Every day | ORAL | Status: DC
  Administered 2012-08-21 – 2012-08-30 (×9): 1 via ORAL
  Filled 2012-08-21 (×10): qty 1

## 2012-08-21 MED ORDER — ONDANSETRON HCL 4 MG PO TABS
4.0000 mg | ORAL_TABLET | Freq: Four times a day (QID) | ORAL | Status: DC | PRN
  Administered 2012-08-26 – 2012-08-27 (×2): 4 mg via ORAL

## 2012-08-21 MED ORDER — MORPHINE SULFATE 2 MG/ML IJ SOLN: 2.0000 mg | INTRAMUSCULAR | Status: DC | PRN

## 2012-08-21 MED ORDER — INSULIN ASPART 100 UNIT/ML ~~LOC~~ SOLN
0.0000 [IU] | Freq: Three times a day (TID) | SUBCUTANEOUS | Status: DC
  Administered 2012-08-21: 5 [IU] via SUBCUTANEOUS
  Administered 2012-08-21: 3 [IU] via SUBCUTANEOUS
  Administered 2012-08-22: 8 [IU] via SUBCUTANEOUS
  Administered 2012-08-22 – 2012-08-23 (×2): 3 [IU] via SUBCUTANEOUS
  Administered 2012-08-23: 5 [IU] via SUBCUTANEOUS
  Administered 2012-08-24: 3 [IU] via SUBCUTANEOUS
  Administered 2012-08-24: 5 [IU] via SUBCUTANEOUS
  Administered 2012-08-24 – 2012-08-26 (×2): 3 [IU] via SUBCUTANEOUS
  Administered 2012-08-26 – 2012-08-27 (×3): 5 [IU] via SUBCUTANEOUS
  Administered 2012-08-27 – 2012-08-28 (×2): 3 [IU] via SUBCUTANEOUS
  Administered 2012-08-29: 8 [IU] via SUBCUTANEOUS
  Administered 2012-08-29: 3 [IU] via SUBCUTANEOUS
  Administered 2012-08-30: 2 [IU] via SUBCUTANEOUS
  Administered 2012-08-30: 3 [IU] via SUBCUTANEOUS

## 2012-08-21 MED ORDER — ACETAMINOPHEN 650 MG RE SUPP: 650.0000 mg | Freq: Four times a day (QID) | RECTAL | Status: DC | PRN

## 2012-08-21 NOTE — Progress Notes (Signed)
Patient admitted early this AM by Dr. Claria Dice- please see H&P:  Diabetic foot ulcer right lower extremity- foul smelling blood cultures and wound cultures ordered  Wound care consult requested  MRI right lower extremity to rule out osteomyelitis - once this done may need ortho consult Antibiotics started   .Tearof skin of multiple sites of lower extremity  Nursing to continue dressing  Wound care consult  .Nausea & vomiting  Antiemetics ordered, IV fluid hydration  - resolved  .Acute kidney injury  IV fluid hydration  D/c metformin and ACE inhibitor  .Diabetes mellitus type 2, uncontrolled .Hypertension stable resume home medication on 70/30  ADA diet insulin scale insulin ordered   .Alcohol dependency patient denies however patient is in place on CIWA protocol   Eulogio Bear DO [phone]

## 2012-08-21 NOTE — Progress Notes (Signed)
Pt's iv was beeping and not working when pt arrived in MRI. IV catheter was out of the site and kinked when I examined site. Right Elbow appears to be swollen. Two attempts were made to restart IV with no success.

## 2012-08-21 NOTE — Progress Notes (Addendum)
ANTIBIOTIC CONSULT NOTE - INITIAL  Pharmacy Consult for vancomycin Indication: cellulitis/diabetic foot ulcers with possible osteo  No Known Allergies  Patient Measurements: Height: 5\' 6"  (167.6 cm) (66inches) Weight: 180 lb 1.6 oz (81.693 kg) IBW/kg (Calculated) : 59.3   Vital Signs: Temp: 99.7 F (37.6 C) (09/16 0327) Temp src: Oral (09/16 0241) BP: 123/78 mmHg (09/16 0327) Pulse Rate: 97  (09/16 0327)  Labs:  Basename 08/20/12 1811  WBC 24.4*  HGB 8.7*  PLT 401*  LABCREA --  CREATININE 1.24*   Estimated Creatinine Clearance: 62.4 ml/min (by C-G formula based on Cr of 1.24).  Microbiology: No results found for this or any previous visit (from the past 720 hour(s)).  Medical History: Past Medical History  Diagnosis Date  . Diabetes mellitus type 2, uncontrolled   . Hypertension   . Anemia   . Alcohol dependency   . Diabetes mellitus     type 2  . Necrotizing fasciitis     Medications:  Prescriptions prior to admission  Medication Sig Dispense Refill  . insulin NPH-insulin regular (NOVOLIN 70/30) (70-30) 100 UNIT/ML injection Inject 23 Units into the skin 2 (two) times daily with a meal.      . lisinopril (PRINIVIL,ZESTRIL) 10 MG tablet Take 1 tablet (10 mg total) by mouth daily.  30 tablet  1  . metFORMIN (GLUCOPHAGE) 1000 MG tablet Take 1 tablet (1,000 mg total) by mouth 2 (two) times daily with a meal.  60 tablet  6   Scheduled:    . acetaminophen  650 mg Oral Once  . cefTRIAXone (ROCEPHIN)  IV  1 g Intravenous Q24H  . folic acid  1 mg Oral Daily  . heparin  5,000 Units Subcutaneous Q8H  . insulin aspart  0-15 Units Subcutaneous TID WC  . insulin aspart  0-5 Units Subcutaneous QHS  . insulin aspart protamine-insulin aspart  23 Units Subcutaneous BID WC  . lisinopril  10 mg Oral Daily  . LORazepam  0-4 mg Oral Q6H   Followed by  . LORazepam  0-4 mg Oral Q12H  . metFORMIN  1,000 mg Oral BID WC  . metoCLOPramide (REGLAN) injection  10 mg Intravenous  Once  . multivitamin with minerals  1 tablet Oral Daily  . ondansetron  8 mg Oral Once  . sodium chloride  1,000 mL Intravenous Once  . thiamine  100 mg Oral Daily   Or  . thiamine  100 mg Intravenous Daily  . vancomycin  1,000 mg Intravenous Q12H    Assessment: 46yo female c/o N/V/D and decreased PO intake, has elevated WBC and Xray c/w cellulitis, awaiting MRI to r/o osteo, to begin IV ABX.  Goal of Therapy:  Vancomycin trough level 15-20 mcg/ml (will decrease if osteo r/o)  Plan:  Will begin vancomycin 1000mg  IV Q12H and monitor CBC, Cx, MRI, levels prn.  Rogue Bussing PharmD BCPS 08/21/2012,3:40 AM  Patient Scr continues to climb and patient has history of vancomycin usage with drug accumulation resulting toxic levels. Will adjust Vanc to 1g IV q24 and continue to follow.  Davonna Belling, PharmD, BCPS Pager [phone]

## 2012-08-21 NOTE — ED Notes (Signed)
Pt is aware of need for urine and stool specimen.

## 2012-08-21 NOTE — Progress Notes (Signed)
UR COMPLETED  

## 2012-08-21 NOTE — H&P (Signed)
PCP:   [facility]   Chief Complaint:  Nausea and vomiting  HPI: This is a 46 year old female with multiple comorbid issues. She states for three-day she could not eat, she was nauseous but not vomiting. This resolved 2 days ago and she started eating again, she then developed constipation. She finally was able to eat, then she started having diarrhea. She again developed nausea and vomiting. She chills and fevers. She reports she has a cough but no shortness of breath.he She denies any burning on urination or any abdominal pain. She does have chronic ulcers on  her legs and feet and some which are foul-smelling. She states this is chronic. Patient states her sugars are well-controlled.  Review of Systems:  The patient denies anorexia, weight loss,, vision loss, decreased hearing, hoarseness, chest pain, syncope, dyspnea on exertion, peripheral edema, balance deficits, hemoptysis, abdominal pain, melena, hematochezia, severe indigestion/heartburn, hematuria, incontinence, genital sores, muscle weakness, suspicious skin lesions, transient blindness, difficulty walking, depression, unusual weight change, abnormal bleeding, enlarged lymph nodes, angioedema, and breast masses.  Past Medical History: Past Medical History  Diagnosis Date  . Diabetes mellitus type 2, uncontrolled   . Hypertension   . Anemia   . Alcohol dependency   . Diabetes mellitus     type 2  . Necrotizing fasciitis    Past Surgical History  Procedure Date  . Amputation     right first and left middle finger amputation 2/2 OM 08/2011 by Dr. Amedeo Plenty  . Cesarean section   . I&d left thumb 12/23/2011  . Tubal ligation   . I&d extremity 12/24/2011    Procedure: IRRIGATION AND DEBRIDEMENT EXTREMITY;  Surgeon: Tennis Must, MD;  Location: [place];  Service: Orthopedics;  Laterality: Left;  I&Dleft thumb with partial amputation  . I&d extremity 12/26/2011    Procedure: IRRIGATION AND DEBRIDEMENT EXTREMITY;  Surgeon: Tennis Must,  MD;  Location: [place];  Service: Orthopedics;  Laterality: Left;  . I&d extremity 12/23/2011    Procedure: IRRIGATION AND DEBRIDEMENT EXTREMITY;  Surgeon: Tennis Must, MD;  Location: [place];  Service: Orthopedics;  Laterality: Left;  I&D Left thumb, hand and arm  . Amputation 01/18/2012    Procedure: AMPUTATION DIGIT;  Surgeon: Tennis Must, MD;  Location: [place];  Service: Orthopedics;  Laterality: Left;  revision amputation left thumb    Medications: Prior to Admission medications   Medication Sig Start Date End Date Taking? Authorizing Provider  insulin NPH-insulin regular (NOVOLIN 70/30) (70-30) 100 UNIT/ML injection Inject 23 Units into the skin 2 (two) times daily with a meal.   Yes Historical Provider, MD  lisinopril (PRINIVIL,ZESTRIL) 10 MG tablet Take 1 tablet (10 mg total) by mouth daily. 01/13/12 01/12/13 Yes Janell Quiet, MD  metFORMIN (GLUCOPHAGE) 1000 MG tablet Take 1 tablet (1,000 mg total) by mouth 2 (two) times daily with a meal. 12/29/11 12/28/12 Yes Ivor Costa, MD    Allergies:  No Known Allergies  Social History:  reports that she has never smoked. She has never used smokeless tobacco. She reports that she drinks alcohol. She reports that she does not use illicit drugs.  Family History: Family History  Problem Relation Age of Onset  . Diabetes Mother   . Diabetes Father     Physical Exam: Filed Vitals:   08/20/12 1739 08/20/12 1957 08/20/12 2221 08/21/12 0043  BP: 156/86 170/82 186/73 131/79  Pulse: 113 98 98 116  Temp: 102.5 F (39.2 C) 99.1 F (37.3 C) 98.5  F (36.9 C)   TempSrc: Oral Oral Oral   Resp: 22 20 19    SpO2: 100% 100% 98% 97%    General:  Alert and oriented times three, well developed and nourished, no acute distress Eyes: PERRLA, pink conjunctiva, no scleral icterus ENT: Moist oral mucosa, neck supple, no thyromegaly Lungs: clear to ascultation, no wheeze, no crackles, no use of accessory muscles Cardiovascular: regular rate and  rhythm, no regurgitation, no gallops, no murmurs. No carotid bruits, no JVD Abdomen: soft, positive BS, non-tender, non-distended, no organomegaly, not an acute abdomen GU: not examined Neuro: CN II - XII grossly intact, sensation intact Musculoskeletal: strength 5/5 all extremities, no clubbing, cyanosis or edema, finger amputation  Skin: no rash, no subcutaneous crepitation, no decubitus multiple skin tears over bilateral lower extremities,  right extremity plantar surface diabetic ulcer foul-smelling     Labs on Admission:   [facility] 08/20/12 1811  NA 130*  K 4.3  CL 96  CO2 17*  GLUCOSE 216*  BUN 21  CREATININE 1.24*  CALCIUM 8.9  MG --  PHOS --   No results found for this basename: AST:2,ALT:2,ALKPHOS:2,BILITOT:2,PROT:2,ALBUMIN:2 in the last 72 hours No results found for this basename: LIPASE:2,AMYLASE:2 in the last 72 hours  Basename 08/20/12 1811  WBC 24.4*  NEUTROABS 21.8*  HGB 8.7*  HCT 27.7*  MCV 73.5*  PLT 401*   No results found for this basename: CKTOTAL:3,CKMB:3,CKMBINDEX:3,TROPONINI:3 in the last 72 hours No components found with this basename: POCBNP:3 No results found for this basename: DDIMER:2 in the last 72 hours No results found for this basename: HGBA1C:2 in the last 72 hours No results found for this basename: CHOL:2,HDL:2,LDLCALC:2,TRIG:2,CHOLHDL:2,LDLDIRECT:2 in the last 72 hours No results found for this basename: TSH,T4TOTAL,FREET3,T3FREE,THYROIDAB in the last 72 hours No results found for this basename: VITAMINB12:2,FOLATE:2,FERRITIN:2,TIBC:2,IRON:2,RETICCTPCT:2 in the last 72 hours  Micro Results: No results found for this or any previous visit (from the past 240 hour(s)).   Radiological Exams on Admission: Dg Chest Portable 1 View  08/20/2012  *RADIOLOGY REPORT*  Clinical Data: 1-week history of nausea, vomiting, diarrhea.  PORTABLE CHEST - 1 VIEW  Comparison: 01/07/2012  Findings: Poor inspiratory effort.  No infiltrates or failure.  Slight left base scarring.  Normal cardiomediastinal silhouette. No visible effusion or pneumothorax.  No change from priors.  IMPRESSION: Stable chest.   Original Report Authenticated By: Staci Righter, M.D.    Dg Foot Complete Right  08/20/2012  *RADIOLOGY REPORT*  Clinical Data: Nonhealing wound on the bottom of the foot.  RIGHT FOOT COMPLETE - 3+ VIEW  Comparison: Plain films 01/07/2012.  Findings: There is extensive soft tissue swelling about the foot. A radiopaque foreign body is identified in the subcutaneous tissues in the plantar aspect of the foot with an appearance most consistent with a piece of glass measuring 0.5 cm in diameter.  No bony destructive change is seen.  There is no fracture.  Plantar calcaneal spur is noted.  IMPRESSION: Extensive soft tissue swelling compatible with cellulitis. Radiopaque foreign body in the plantar soft tissues is identified as described above.  No plain film evidence of osteomyelitis.   Original Report Authenticated By: Arvid Right. Luther Parody, M.D.     Assessment/Plan Present on Admission:  Diabetic foot ulcer right lower extremity  Leukocytosis/cellulitis  admit to MedSurg  blood cultures and wound cultures ordered Wound care consult requested  MRI right lower extremity to rule out osteomyelitis Antibiotics started  Right lower extremity ulcer foul-smelling  .Tearof skin of multiple sites of  lower extremity Nursing to continue dressing  .Nausea & vomiting Antiemetics ordered, IV fluid hydration  .Acute kidney injury IV fluid hydration continue patient on ACE inhibitor this has been continued, low tolerance to discontinue  Patient remains a metformin  .Diabetes mellitus type 2, uncontrolled .Hypertension  stable resume home medication ADA diet insulin scale insulin ordered  .Alcohol dependency  patient denies however patient is in place on CIWA protocol     Full code DVT prophylaxis  Janely Gullickson 08/21/2012, 1:10 AM

## 2012-08-21 NOTE — Progress Notes (Addendum)
Inpatient Diabetes Program Recommendations  AACE/ADA: New Consensus Statement on Inpatient Glycemic Control (2013)  Target Ranges:  Prepandial:   less than 140 mg/dL      Peak postprandial:   less than 180 mg/dL (1-2 hours)      Critically ill patients:  140 - 180 mg/dL   Reason for Visit: Sub-optimal glycemic control  Inpatient Diabetes Program Recommendations Oral Agents: Note patient on Metformin as an outpatient.  Has been stopped due to elevated Creatinine.  Note history of ETOH dependency.  Metformin is contraindicated in patients with heavy ETOH use, so would not recommend resumption at discharge..  HgbA1C: Last known Hgb A1C is 12.3 done in January 2013.  Please consider ordering another.  [name] S. Adonias Demore, RN, CNS, CDE  [phone])   This addendum is to correct statement above regarding contraindication of Metformin with heavy ETOH abuse.  More appropriate statement is that metformin/glucophage should be used with caution in patients with excessive ETOH intake.  Thank you. [name] S. [name] Mates, RN, [place], [place]  [phone])

## 2012-08-22 DIAGNOSIS — F1020 Alcohol dependence, uncomplicated: Secondary | ICD-10-CM

## 2012-08-22 DIAGNOSIS — N179 Acute kidney failure, unspecified: Secondary | ICD-10-CM

## 2012-08-22 DIAGNOSIS — R112 Nausea with vomiting, unspecified: Secondary | ICD-10-CM

## 2012-08-22 DIAGNOSIS — F1010 Alcohol abuse, uncomplicated: Secondary | ICD-10-CM

## 2012-08-22 DIAGNOSIS — D649 Anemia, unspecified: Secondary | ICD-10-CM

## 2012-08-22 DIAGNOSIS — A498 Other bacterial infections of unspecified site: Secondary | ICD-10-CM | POA: Diagnosis present

## 2012-08-22 LAB — CBC
HCT: 23.8 % — ABNORMAL LOW (ref 36.0–46.0)
Hemoglobin: 7.3 g/dL — ABNORMAL LOW (ref 12.0–15.0)
MCH: 22.1 pg — ABNORMAL LOW (ref 26.0–34.0)
MCHC: 30.7 g/dL (ref 30.0–36.0)
MCV: 71.9 fL — ABNORMAL LOW (ref 78.0–100.0)
Platelets: 440 10*3/uL — ABNORMAL HIGH (ref 150–400)
RBC: 3.31 MIL/uL — ABNORMAL LOW (ref 3.87–5.11)
RDW: 14.4 % (ref 11.5–15.5)
WBC: 24 10*3/uL — ABNORMAL HIGH (ref 4.0–10.5)

## 2012-08-22 LAB — BASIC METABOLIC PANEL
BUN: 32 mg/dL — ABNORMAL HIGH (ref 6–23)
CO2: 20 mEq/L (ref 19–32)
Calcium: 8.1 mg/dL — ABNORMAL LOW (ref 8.4–10.5)
Chloride: 100 mEq/L (ref 96–112)
Creatinine, Ser: 1.43 mg/dL — ABNORMAL HIGH (ref 0.50–1.10)
GFR calc Af Amer: 51 mL/min — ABNORMAL LOW (ref >90)
GFR calc non Af Amer: 44 mL/min — ABNORMAL LOW (ref >90)
Glucose, Bld: 297 mg/dL — ABNORMAL HIGH (ref 70–99)
Potassium: 4.1 mEq/L (ref 3.5–5.1)
Sodium: 131 mEq/L — ABNORMAL LOW (ref 135–145)

## 2012-08-22 LAB — HEMOGLOBIN A1C
Hgb A1c MFr Bld: 9.3 % — ABNORMAL HIGH (ref <5.7)
Mean Plasma Glucose: 220 mg/dL — ABNORMAL HIGH (ref <117)

## 2012-08-22 LAB — GLUCOSE, CAPILLARY
Glucose-Capillary: 284 mg/dL — ABNORMAL HIGH (ref 70–99)
Glucose-Capillary: 93 mg/dL (ref 70–99)
Glucose-Capillary: 191 mg/dL — ABNORMAL HIGH (ref 70–99)
Glucose-Capillary: 129 mg/dL — ABNORMAL HIGH (ref 70–99)

## 2012-08-22 MED ORDER — SODIUM CHLORIDE 0.9 % IV SOLN
500.0000 mg | Freq: Three times a day (TID) | INTRAVENOUS | Status: DC
  Administered 2012-08-22 – 2012-08-25 (×9): 500 mg via INTRAVENOUS
  Filled 2012-08-22 (×12): qty 500

## 2012-08-22 NOTE — Consult Note (Signed)
INFECTIOUS DISEASE CONSULT NOTE  Date of Admission:  08/20/2012  Date of Consult:  08/22/2012  Reason for Consult:Cellulitis Referring Physician: Marye Round  Impression/Recommendation Cellulitis DM2, out of control ARF Would - continue vanco and imipenem Await wound Cx Await BCx Diabetic teaching and control.   Comment- not clear that previous cx's were true infection vs colonization of wounds. She will need prolonged anbx again. 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  Her health literacy appears limited.   Thank you so much for this interesting consult,   [name] [id number]  [name] is an 46 y.o. female.  HPI: 46 yo F with hx of DM2, HTN, ETOH abuse, as well as previous L thumb necrotizing fasciitis Jan 2013.  She returned in February with worsening L thumb d/c, Cx of which grew Serratia, Viridans streptococci. She underwent partial amputation on 2-12 and Cx at that time grew Pseudomonas. She was treated with ceftriaxone for 6 weeks via [facility] (completed 02-25-12). She was seen in ID on 3-28 and changed to po cipro while awaiting f/u studies. She did not return.  Now adm 9-16 with 3 day hx of nausea and loss of appetite followed by constipation then diarrhea. This was then followed by n/v. She c/o chills and fevers as well chronic LE wounds (states she has had for several months). In hospital she was found to have multiple foul smelling wounds and temp 102.5. She was started on vanco/imipenem. MRI of the R forefoot showed no abscess.    Past Medical History  Diagnosis Date  . Diabetes mellitus type 2, uncontrolled   . Hypertension   . Anemia   . Alcohol dependency   . Diabetes mellitus     type 2  . Necrotizing fasciitis     Past Surgical History  Procedure Date  . Amputation     right first and left middle finger amputation 2/2 OM 08/2011 by Dr. Amedeo Plenty  . Cesarean section   . I&d left thumb 12/23/2011  . Tubal ligation   . I&d extremity  12/24/2011    Procedure: IRRIGATION AND DEBRIDEMENT EXTREMITY;  Surgeon: Tennis Must, MD;  Location: [place];  Service: Orthopedics;  Laterality: Left;  I&Dleft thumb with partial amputation  . I&d extremity 12/26/2011    Procedure: IRRIGATION AND DEBRIDEMENT EXTREMITY;  Surgeon: Tennis Must, MD;  Location: [place];  Service: Orthopedics;  Laterality: Left;  . I&d extremity 12/23/2011    Procedure: IRRIGATION AND DEBRIDEMENT EXTREMITY;  Surgeon: Tennis Must, MD;  Location: [place];  Service: Orthopedics;  Laterality: Left;  I&D Left thumb, hand and arm  . Amputation 01/18/2012    Procedure: AMPUTATION DIGIT;  Surgeon: Tennis Must, MD;  Location: [place];  Service: Orthopedics;  Laterality: Left;  revision amputation left thumb     No Known Allergies  Medications:  Scheduled:   . acetaminophen  650 mg Oral Once  . folic acid  1 mg Oral Daily  . heparin  5,000 Units Subcutaneous Q8H  . imipenem-cilastatin  500 mg Intravenous Q8H  . influenza  inactive virus vaccine  0.5 mL Intramuscular Tomorrow-1000  . insulin aspart  0-15 Units Subcutaneous TID WC  . insulin aspart  0-5 Units Subcutaneous QHS  . insulin aspart protamine-insulin aspart  23 Units Subcutaneous BID WC  . LORazepam  0-4 mg Oral Q6H   Followed by  . LORazepam  0-4 mg Oral Q12H  . multivitamin with minerals  1 tablet Oral  Daily  . thiamine  100 mg Oral Daily   Or  . thiamine  100 mg Intravenous Daily  . vancomycin  1,000 mg Intravenous Q24H  . DISCONTD: cefTRIAXone (ROCEPHIN)  IV  1 g Intravenous Q24H    Total days of antibiotics 2    vanco/imipenem  Day 2          Social History:  reports that she has never smoked. She has never used smokeless tobacco. She reports that she drinks about .6 ounces of alcohol per week. She reports that she does not use illicit drugs.  Family History  Problem Relation Age of Onset  . Diabetes Mother   . Diabetes Father     General ROS: denies vision change, states  she had eyes checked last year in hospital,  Normal BM, normal urination, see HPI.   Blood pressure 124/80, pulse 90, temperature 99 F (37.2 C), temperature source Oral, resp. rate 16, height 5\' 6"  (1.676 m), weight 81.693 kg (180 lb 1.6 oz), last menstrual period 08/13/2012, SpO2 100.00%. General appearance: alert, cooperative and no distress Eyes: negative Throat: normal findings: oropharynx pink & moist without lesions or evidence of thrush Neck: no adenopathy Lungs: clear to auscultation bilaterally Heart: regular rate and rhythm Abdomen: normal findings: bowel sounds normal and soft, non-tender Extremities: edema none Skin: several skin ulcers and wounds on shins, bottom of R foot. there is no expressable d/c fromthese wounds. they are non-tender. they are foul smelling.  Normal light touch LE grossly   Results for orders placed during the hospital encounter of 08/20/12 (from the past 48 hour(s))  GLUCOSE, CAPILLARY     Status: Abnormal   Collection Time   08/20/12  5:44 PM      Component Value Range Comment   Glucose-Capillary 195 (*) 70 - 99 mg/dL   CBC WITH DIFFERENTIAL     Status: Abnormal   Collection Time   08/20/12  6:11 PM      Component Value Range Comment   WBC 24.4 (*) 4.0 - 10.5 K/uL    RBC 3.77 (*) 3.87 - 5.11 MIL/uL    Hemoglobin 8.7 (*) 12.0 - 15.0 g/dL    HCT 27.7 (*) 36.0 - 46.0 %    MCV 73.5 (*) 78.0 - 100.0 fL    MCH 23.1 (*) 26.0 - 34.0 pg    MCHC 31.4  30.0 - 36.0 g/dL    RDW 14.4  11.5 - 15.5 %    Platelets 401 (*) 150 - 400 K/uL    Neutrophils Relative 89 (*) 43 - 77 %    Neutro Abs 21.8 (*) 1.7 - 7.7 K/uL    Lymphocytes Relative 3 (*) 12 - 46 %    Lymphs Abs 0.8  0.7 - 4.0 K/uL    Monocytes Relative 7  3 - 12 %    Monocytes Absolute 1.7 (*) 0.1 - 1.0 K/uL    Eosinophils Relative 0  0 - 5 %    Eosinophils Absolute 0.1  0.0 - 0.7 K/uL    Basophils Relative 0  0 - 1 %    Basophils Absolute 0.1  0.0 - 0.1 K/uL   BASIC METABOLIC PANEL     Status:  Abnormal   Collection Time   08/20/12  6:11 PM      Component Value Range Comment   Sodium 130 (*) 135 - 145 mEq/L    Potassium 4.3  3.5 - 5.1 mEq/L    Chloride 96  96 -  112 mEq/L    CO2 17 (*) 19 - 32 mEq/L    Glucose, Bld 216 (*) 70 - 99 mg/dL    BUN 21  6 - 23 mg/dL    Creatinine, Ser 1.24 (*) 0.50 - 1.10 mg/dL    Calcium 8.9  8.4 - 10.5 mg/dL    GFR calc non Af Amer 52 (*) >90 mL/min    GFR calc Af Amer 60 (*) >90 mL/min   LACTIC ACID, PLASMA     Status: Normal   Collection Time   08/20/12 10:33 PM      Component Value Range Comment   Lactic Acid, Venous 1.4  0.5 - 2.2 mmol/L   CULTURE, BLOOD (ROUTINE X 2)     Status: Normal (Preliminary result)   Collection Time   08/20/12 10:45 PM      Component Value Range Comment   Specimen Description BLOOD LEFT ARM      Special Requests        Value: BOTTLES DRAWN AEROBIC AND ANAEROBIC 6CC BLUE 5CC RED   Culture  Setup Time 08/21/2012 14:17      Culture        Value:        BLOOD CULTURE RECEIVED NO GROWTH TO DATE CULTURE WILL BE HELD FOR 5 DAYS BEFORE ISSUING A FINAL NEGATIVE REPORT   Report Status PENDING     PROCALCITONIN     Status: Normal   Collection Time   08/20/12 10:55 PM      Component Value Range Comment   Procalcitonin 1.46     POCT I-STAT 3, BLOOD GAS (G3P V)     Status: Abnormal   Collection Time   08/20/12 11:01 PM      Component Value Range Comment   pH, Ven 7.290  7.250 - 7.300    pCO2, Ven 35.5 (*) 45.0 - 50.0 mmHg    pO2, Ven 30.0  30.0 - 45.0 mmHg    Bicarbonate 17.1 (*) 20.0 - 24.0 mEq/L    TCO2 18  0 - 100 mmol/L    O2 Saturation 51.0      Acid-base deficit 9.0 (*) 0.0 - 2.0 mmol/L    Sample type VENOUS      Comment NOTIFIED PHYSICIAN     CULTURE, BLOOD (ROUTINE X 2)     Status: Normal (Preliminary result)   Collection Time   08/20/12 11:15 PM      Component Value Range Comment   Specimen Description BLOOD RIGHT HAND      Special Requests BOTTLES DRAWN AEROBIC AND ANAEROBIC 2CC EA      Culture  Setup  Time 08/21/2012 14:16      Culture        Value:        BLOOD CULTURE RECEIVED NO GROWTH TO DATE CULTURE WILL BE HELD FOR 5 DAYS BEFORE ISSUING A FINAL NEGATIVE REPORT   Report Status PENDING     URINALYSIS, ROUTINE W REFLEX MICROSCOPIC     Status: Abnormal   Collection Time   08/21/12  5:49 AM      Component Value Range Comment   Color, Urine AMBER (*) YELLOW BIOCHEMICALS MAY BE AFFECTED BY COLOR   APPearance CLOUDY (*) CLEAR    Specific Gravity, Urine 1.026  1.005 - 1.030    pH 5.5  5.0 - 8.0    Glucose, UA 500 (*) NEGATIVE mg/dL    Hgb urine dipstick LARGE (*) NEGATIVE    Bilirubin Urine SMALL (*) NEGATIVE  Ketones, ur 40 (*) NEGATIVE mg/dL    Protein, ur >300 (*) NEGATIVE mg/dL    Urobilinogen, UA 1.0  0.0 - 1.0 mg/dL    Nitrite NEGATIVE  NEGATIVE    Leukocytes, UA NEGATIVE  NEGATIVE   URINE MICROSCOPIC-ADD ON     Status: Abnormal   Collection Time   08/21/12  5:49 AM      Component Value Range Comment   Squamous Epithelial / LPF RARE  RARE    WBC, UA 0-2  <3 WBC/hpf    RBC / HPF 7-10  <3 RBC/hpf    Bacteria, UA FEW (*) RARE    Casts HYALINE CASTS (*) NEGATIVE GRANULAR CAST   Urine-Other RARE YEAST     GLUCOSE, CAPILLARY     Status: Abnormal   Collection Time   08/21/12  8:24 AM      Component Value Range Comment   Glucose-Capillary 412 (*) 70 - 99 mg/dL   CBC     Status: Abnormal   Collection Time   08/21/12  8:43 AM      Component Value Range Comment   WBC 30.4 (*) 4.0 - 10.5 K/uL    RBC 3.45 (*) 3.87 - 5.11 MIL/uL    Hemoglobin 7.9 (*) 12.0 - 15.0 g/dL    HCT 24.9 (*) 36.0 - 46.0 %    MCV 72.2 (*) 78.0 - 100.0 fL    MCH 22.9 (*) 26.0 - 34.0 pg    MCHC 31.7  30.0 - 36.0 g/dL    RDW 14.5  11.5 - 15.5 %    Platelets 424 (*) 150 - 400 K/uL   BASIC METABOLIC PANEL     Status: Abnormal   Collection Time   08/21/12  8:43 AM      Component Value Range Comment   Sodium 129 (*) 135 - 145 mEq/L    Potassium 3.9  3.5 - 5.1 mEq/L    Chloride 95 (*) 96 - 112 mEq/L    CO2  19  19 - 32 mEq/L    Glucose, Bld 372 (*) 70 - 99 mg/dL    BUN 27 (*) 6 - 23 mg/dL    Creatinine, Ser 1.56 (*) 0.50 - 1.10 mg/dL    Calcium 8.4  8.4 - 10.5 mg/dL    GFR calc non Af Amer 39 (*) >90 mL/min    GFR calc Af Amer 46 (*) >90 mL/min   GLUCOSE, CAPILLARY     Status: Abnormal   Collection Time   08/21/12 11:42 AM      Component Value Range Comment   Glucose-Capillary 244 (*) 70 - 99 mg/dL   CLOSTRIDIUM DIFFICILE BY PCR     Status: Normal   Collection Time   08/21/12  2:56 PM      Component Value Range Comment   C difficile by pcr NEGATIVE  NEGATIVE   GLUCOSE, CAPILLARY     Status: Abnormal   Collection Time   08/21/12  7:10 PM      Component Value Range Comment   Glucose-Capillary 174 (*) 70 - 99 mg/dL   GLUCOSE, CAPILLARY     Status: Abnormal   Collection Time   08/21/12 10:25 PM      Component Value Range Comment   Glucose-Capillary 168 (*) 70 - 99 mg/dL   BASIC METABOLIC PANEL     Status: Abnormal   Collection Time   08/22/12  6:03 AM      Component Value Range Comment   Sodium 131 (*)  135 - 145 mEq/L    Potassium 4.1  3.5 - 5.1 mEq/L    Chloride 100  96 - 112 mEq/L    CO2 20  19 - 32 mEq/L    Glucose, Bld 297 (*) 70 - 99 mg/dL    BUN 32 (*) 6 - 23 mg/dL    Creatinine, Ser 1.43 (*) 0.50 - 1.10 mg/dL    Calcium 8.1 (*) 8.4 - 10.5 mg/dL    GFR calc non Af Amer 44 (*) >90 mL/min    GFR calc Af Amer 51 (*) >90 mL/min   CBC     Status: Abnormal   Collection Time   08/22/12  6:03 AM      Component Value Range Comment   WBC 24.0 (*) 4.0 - 10.5 K/uL    RBC 3.31 (*) 3.87 - 5.11 MIL/uL    Hemoglobin 7.3 (*) 12.0 - 15.0 g/dL    HCT 23.8 (*) 36.0 - 46.0 %    MCV 71.9 (*) 78.0 - 100.0 fL    MCH 22.1 (*) 26.0 - 34.0 pg    MCHC 30.7  30.0 - 36.0 g/dL    RDW 14.4  11.5 - 15.5 %    Platelets 440 (*) 150 - 400 K/uL   HEMOGLOBIN A1C     Status: Abnormal   Collection Time   08/22/12  6:03 AM      Component Value Range Comment   Hemoglobin A1C 9.3 (*) <5.7 %    Mean Plasma  Glucose 220 (*) <117 mg/dL   GLUCOSE, CAPILLARY     Status: Abnormal   Collection Time   08/22/12  6:51 AM      Component Value Range Comment   Glucose-Capillary 284 (*) 70 - 99 mg/dL   GLUCOSE, CAPILLARY     Status: Normal   Collection Time   08/22/12 11:54 AM      Component Value Range Comment   Glucose-Capillary 93  70 - 99 mg/dL       Component Value Date/Time   SDES BLOOD RIGHT HAND 08/20/2012 2315   SPECREQUEST BOTTLES DRAWN AEROBIC AND ANAEROBIC 2CC EA 08/20/2012 2315   CULT        BLOOD CULTURE RECEIVED NO GROWTH TO DATE CULTURE WILL BE HELD FOR 5 DAYS BEFORE ISSUING A FINAL NEGATIVE REPORT 08/20/2012 2315   REPTSTATUS PENDING 08/20/2012 2315   Mr Foot Right Wo Contrast  08/22/2012  *RADIOLOGY REPORT*  Clinical Data: Medial right mid foot wound.  Multiple previous irrigation & drainages and amputations.  Question osteomyelitis.  MRI OF THE RIGHT FOREFOOT WITHOUT CONTRAST  Technique:  Multiplanar, multisequence MR imaging was performed. No intravenous contrast was administered (no IV access).  Comparison: Radiographs 08/20/2012.  Findings: The study includes the midfoot and forefoot.  The hind foot is not imaged.  The area of the plantar foreign body adjacent to the calcaneal tuberosity demonstrated on the prior radiographs is not imaged.  There is dermal thickening and skin ulceration along the plantar aspect of the midfoot extending medially.  This abuts the plantar fascia but extends no deeper.  The signal alteration is most pronounced on the T1-weighted images where there is loss of fat signal.  On the T2-weighted images, there is only mild signal alteration, most consistent with granulation tissue or fibrosis. There is no drainable fluid collection.  There is dorsal subcutaneous edema which is greatest laterally. There is a small amount of ill-defined fluid along the dorsal aspect of the extensor muscles in the  midfoot.  There is no significant extensor tenosynovitis or drainable fluid  collection. There is a small amount of flexor tendon sheath fluid within the midfoot which is commonly seen.  The underlying forefoot musculature demonstrates atrophy and nonspecific mild T2 hyperintensity, probably diabetic myopathy.  The bones of the forefoot and midfoot appear normal without evidence of osteomyelitis. The visualized calcaneus appears normal, although the tuberosity is not imaged.  IMPRESSION:  1.  Plantar midfoot skin ulceration with underlying subcutaneous inflammation/fibrosis.  No drainable fluid collection identified. 2.  Nonspecific dorsal subcutaneous edema in the forefoot. 3.  No deep soft tissue abscess, focal myositis or evidence of osteomyelitis in the midfoot or forefoot. 4.  Note a previously demonstrated soft tissue foreign body in the plantar aspect of the hind foot is not imaged.   Original Report Authenticated By: Vivia Ewing, M.D.    Dg Chest Portable 1 View  08/20/2012  *RADIOLOGY REPORT*  Clinical Data: 1-week history of nausea, vomiting, diarrhea.  PORTABLE CHEST - 1 VIEW  Comparison: 01/07/2012  Findings: Poor inspiratory effort.  No infiltrates or failure. Slight left base scarring.  Normal cardiomediastinal silhouette. No visible effusion or pneumothorax.  No change from priors.  IMPRESSION: Stable chest.   Original Report Authenticated By: Staci Righter, M.D.    Dg Foot Complete Right  08/20/2012  *RADIOLOGY REPORT*  Clinical Data: Nonhealing wound on the bottom of the foot.  RIGHT FOOT COMPLETE - 3+ VIEW  Comparison: Plain films 01/07/2012.  Findings: There is extensive soft tissue swelling about the foot. A radiopaque foreign body is identified in the subcutaneous tissues in the plantar aspect of the foot with an appearance most consistent with a piece of glass measuring 0.5 cm in diameter.  No bony destructive change is seen.  There is no fracture.  Plantar calcaneal spur is noted.  IMPRESSION: Extensive soft tissue swelling compatible with cellulitis.  Radiopaque foreign body in the plantar soft tissues is identified as described above.  No plain film evidence of osteomyelitis.   Original Report Authenticated By: Arvid Right. Luther Parody, M.D.    Recent Results (from the past 240 hour(s))  CULTURE, BLOOD (ROUTINE X 2)     Status: Normal (Preliminary result)   Collection Time   08/20/12 10:45 PM      Component Value Range Status Comment   Specimen Description BLOOD LEFT ARM   Final    Special Requests     Final    Value: BOTTLES DRAWN AEROBIC AND ANAEROBIC 6CC BLUE 5CC RED   Culture  Setup Time 08/21/2012 14:17   Final    Culture     Final    Value:        BLOOD CULTURE RECEIVED NO GROWTH TO DATE CULTURE WILL BE HELD FOR 5 DAYS BEFORE ISSUING A FINAL NEGATIVE REPORT   Report Status PENDING   Incomplete   CULTURE, BLOOD (ROUTINE X 2)     Status: Normal (Preliminary result)   Collection Time   08/20/12 11:15 PM      Component Value Range Status Comment   Specimen Description BLOOD RIGHT HAND   Final    Special Requests BOTTLES DRAWN AEROBIC AND ANAEROBIC 2CC EA   Final    Culture  Setup Time 08/21/2012 14:16   Final    Culture     Final    Value:        BLOOD CULTURE RECEIVED NO GROWTH TO DATE CULTURE WILL BE HELD FOR 5 DAYS BEFORE ISSUING A FINAL  NEGATIVE REPORT   Report Status PENDING   Incomplete   CLOSTRIDIUM DIFFICILE BY PCR     Status: Normal   Collection Time   08/21/12  2:56 PM      Component Value Range Status Comment   C difficile by pcr NEGATIVE  NEGATIVE Final       08/22/2012, 3:03 PM     LOS: 2 days

## 2012-08-22 NOTE — Consult Note (Signed)
WOC consult Note Reason for Consult: pt known to this [place] from previous admission, at which time recommend dermatology and wound care center follow up. She has been managing her wounds at home with dressings.  She presents today with multiple lesions of her bil. LE. No hx of any disease process that would indicate the presentation of these lesions. They are not painful or pruritic.  They are chronic in nature and have been present for over a year.  She also has significant neuropathic foot ulcers x 2 on the right plantar surface and an insensate foot.  I have stressed that without offloading of the foot ulcers she will not be able to heal these areas.  That is why wound care center would be best for follow up to possibly maintain serial debridements and place TCC or other offloading device.   Wound type:Neuropatic foot ulceration of the right plantar surface and new opening with surrounding fluctuance.  Unsure of other open lesions on the bil. LE. She has palpable pulses and no significant edema to make me concerned for arterial or venous dx.  I believe the edema noted in the right foot is associated with underlying ulceration of the foot and associated infection.  Measurement/wound bed/drainage 1.  R pretibial 0.5cm 0.5cmx 0.2cm; pink and moist, very little drainage 2. R medial leg 1.5cm x0.2cm linear lesion; scant yellow drainage, yellow wound bed 3.  L lateral leg 1.0cm x 1.5cm x 0.5cm  4. L pretibial 2cm x 1.5cm x 0.5cm  And 1.5cm x 1.5cm x 0.2cm, pale and moderate amount of foul yellow drainage with odor. 5.  Right plantar: 2cm x 3cm x 1.0cm pale, non granular, with hyperkeratotic periwound 6. Right plantar/heel: 0.5cm x 0.5cm x 2.5cm, fluctuant, with foul yellow purulent drainage   Periwound: all periwound ok, except for site on distal plantar /heel, this area fluctuant with associated edema.  Dressing procedure/placement/frequency: silicone foam for all open lesions of the bil. LE, needs  ortho eval of the right foot ulcer and heel for debridements.   WOC will follow along for continued evaluation of wounds. Deshawna Mcneece Kellogg, [employer]

## 2012-08-22 NOTE — Progress Notes (Signed)
D/c's pt's [facility] precautions due to no recent episodes of diarrhea and -Cdiff PCR.  Placed pt on [place] contact precautions per protocol for draining wound and infection.  MD aware.

## 2012-08-22 NOTE — Progress Notes (Signed)
[facility] PROGRESS NOTE  Lisett Legrow [id number] DOB: [birth year] DOA: 08/20/2012 PCP: William Hamburger, MD  Assessment/Plan: Principal Problem:  *Pseudomonas aeruginosa infection Active Problems:  Diabetes mellitus type 2, uncontrolled  Hypertension  Acute kidney injury  Alcohol dependency  Tear of skin of multiple sites of lower extremity  Nausea & vomiting  Diabetic foot ulcer  Leukocytosis  Diabetic foot ulcer right lower extremity with surrounding cellulitis -  History of multiple resistant pathogens cultured from piror wounds Wound care consult requested  MRI right lower extremity without findings of osteomyelitis.  Antibiotics started with Vanc and Imipenem  HIV negative in 01/2012 ID consult called 08/22/12  .Tearof skin of multiple sites of lower extremity ? Self inflicted  Nursing to continue dressing  Wound care consult    .Acute kidney injury  IV fluid hydration - resolving D/c metformin and ACE inhibitor   .Diabetes mellitus type 2, uncontrolled  stable resume home medication on 70/30  ADA diet insulin scale insulin ordered   .Alcohol dependency patient denies however patient is in place on CIWA protocol    Anemia of chronic disease   Code Status: full Family Communication: patient Disposition Plan: home   Brief narrative: This is a 46 year old female with multiple comorbid issues. She states for three-day she could not eat, she was nauseous but not vomiting. This resolved 2 days ago and she started eating again, she then developed constipation. She finally was able to eat, then she started having diarrhea. She again developed nausea and vomiting. She chills and fevers. She reports she has a cough but no shortness of breath.he She denies any burning on urination or any abdominal pain. She does have chronic ulcers on her legs and feet and some which are foul-smelling. She states this is chronic. Patient states her sugars are well-controlled.        Consultants:  ID  Procedures:  MRI foot  Antibiotics:  Vanc  Imipenem  HPI/Subjective: Wounds with odor   Objective: Filed Vitals:   08/21/12 1039 08/21/12 1413 08/21/12 2228 08/22/12 0652  BP: 118/72 123/65 139/69 124/80  Pulse: 94 87 88 90  Temp:  98.5 F (36.9 C) 99.9 F (37.7 C) 99 F (37.2 C)  TempSrc:      Resp:  18 16 16   Height:      Weight:      SpO2:  100% 99% 100%   No intake or output data in the 24 hours ending 08/22/12 1405 Filed Weights   08/21/12 0327  Weight: 81.693 kg (180 lb 1.6 oz)    Exam:   General:  axox3  Cardiovascular: RRR  Respiratory: CTAB  Abdomen: soft, NT  Skin with chronic hyperpigmentation changes, ulcerations and yellow brown thick pus draining   Data Reviewed: Basic Metabolic Panel:  Lab [id number] 08/21/12 0843 08/20/12 1811  NA 131* 129* 130*  K 4.1 3.9 4.3  CL 100 95* 96  CO2 20 19 17*  GLUCOSE 297* 372* 216*  BUN 32* 27* 21  CREATININE 1.43* 1.56* 1.24*  CALCIUM 8.1* 8.4 8.9  MG -- -- --  PHOS -- -- --   Liver Function Tests: No results found for this basename: AST:5,ALT:5,ALKPHOS:5,BILITOT:5,PROT:5,ALBUMIN:5 in the last 168 hours No results found for this basename: LIPASE:5,AMYLASE:5 in the last 168 hours No results found for this basename: AMMONIA:5 in the last 168 hours CBC:  Lab 08/22/12 0603 08/21/12 0843 08/20/12 1811  WBC 24.0* 30.4* 24.4*  NEUTROABS -- -- 21.8*  HGB 7.3* 7.9* 8.7*  HCT  23.8* 24.9* 27.7*  MCV 71.9* 72.2* 73.5*  PLT 440* 424* 401*   Cardiac Enzymes: No results found for this basename: CKTOTAL:5,CKMB:5,CKMBINDEX:5,TROPONINI:5 in the last 168 hours BNP (last 3 results) No results found for this basename: PROBNP:3 in the last 8760 hours CBG:  Lab 08/22/12 1154 08/22/12 0651 08/21/12 2225 08/21/12 1910 08/21/12 1142  GLUCAP 93 284* 168* 174* 244*    Recent Results (from the past 240 hour(s))  CULTURE, BLOOD (ROUTINE X 2)     Status: Normal (Preliminary  result)   Collection Time   08/20/12 10:45 PM      Component Value Range Status Comment   Specimen Description BLOOD LEFT ARM   Final    Special Requests     Final    Value: BOTTLES DRAWN AEROBIC AND ANAEROBIC 6CC BLUE 5CC RED   Culture  Setup Time 08/21/2012 14:17   Final    Culture     Final    Value:        BLOOD CULTURE RECEIVED NO GROWTH TO DATE CULTURE WILL BE HELD FOR 5 DAYS BEFORE ISSUING A FINAL NEGATIVE REPORT   Report Status PENDING   Incomplete   CULTURE, BLOOD (ROUTINE X 2)     Status: Normal (Preliminary result)   Collection Time   08/20/12 11:15 PM      Component Value Range Status Comment   Specimen Description BLOOD RIGHT HAND   Final    Special Requests BOTTLES DRAWN AEROBIC AND ANAEROBIC 2CC EA   Final    Culture  Setup Time 08/21/2012 14:16   Final    Culture     Final    Value:        BLOOD CULTURE RECEIVED NO GROWTH TO DATE CULTURE WILL BE HELD FOR 5 DAYS BEFORE ISSUING A FINAL NEGATIVE REPORT   Report Status PENDING   Incomplete   CLOSTRIDIUM DIFFICILE BY PCR     Status: Normal   Collection Time   08/21/12  2:56 PM      Component Value Range Status Comment   C difficile by pcr NEGATIVE  NEGATIVE Final      Studies: Mr Foot Right Wo Contrast  08/22/2012  *RADIOLOGY REPORT*  Clinical Data: Medial right mid foot wound.  Multiple previous irrigation & drainages and amputations.  Question osteomyelitis.  MRI OF THE RIGHT FOREFOOT WITHOUT CONTRAST  Technique:  Multiplanar, multisequence MR imaging was performed. No intravenous contrast was administered (no IV access).  Comparison: Radiographs 08/20/2012.  Findings: The study includes the midfoot and forefoot.  The hind foot is not imaged.  The area of the plantar foreign body adjacent to the calcaneal tuberosity demonstrated on the prior radiographs is not imaged.  There is dermal thickening and skin ulceration along the plantar aspect of the midfoot extending medially.  This abuts the plantar fascia but extends no deeper.   The signal alteration is most pronounced on the T1-weighted images where there is loss of fat signal.  On the T2-weighted images, there is only mild signal alteration, most consistent with granulation tissue or fibrosis. There is no drainable fluid collection.  There is dorsal subcutaneous edema which is greatest laterally. There is a small amount of ill-defined fluid along the dorsal aspect of the extensor muscles in the midfoot.  There is no significant extensor tenosynovitis or drainable fluid collection. There is a small amount of flexor tendon sheath fluid within the midfoot which is commonly seen.  The underlying forefoot musculature demonstrates atrophy and nonspecific mild  T2 hyperintensity, probably diabetic myopathy.  The bones of the forefoot and midfoot appear normal without evidence of osteomyelitis. The visualized calcaneus appears normal, although the tuberosity is not imaged.  IMPRESSION:  1.  Plantar midfoot skin ulceration with underlying subcutaneous inflammation/fibrosis.  No drainable fluid collection identified. 2.  Nonspecific dorsal subcutaneous edema in the forefoot. 3.  No deep soft tissue abscess, focal myositis or evidence of osteomyelitis in the midfoot or forefoot. 4.  Note a previously demonstrated soft tissue foreign body in the plantar aspect of the hind foot is not imaged.   Original Report Authenticated By: Vivia Ewing, M.D.    Dg Chest Portable 1 View  08/20/2012  *RADIOLOGY REPORT*  Clinical Data: 1-week history of nausea, vomiting, diarrhea.  PORTABLE CHEST - 1 VIEW  Comparison: 01/07/2012  Findings: Poor inspiratory effort.  No infiltrates or failure. Slight left base scarring.  Normal cardiomediastinal silhouette. No visible effusion or pneumothorax.  No change from priors.  IMPRESSION: Stable chest.   Original Report Authenticated By: Staci Righter, M.D.    Dg Foot Complete Right  08/20/2012  *RADIOLOGY REPORT*  Clinical Data: Nonhealing wound on the bottom of  the foot.  RIGHT FOOT COMPLETE - 3+ VIEW  Comparison: Plain films 01/07/2012.  Findings: There is extensive soft tissue swelling about the foot. A radiopaque foreign body is identified in the subcutaneous tissues in the plantar aspect of the foot with an appearance most consistent with a piece of glass measuring 0.5 cm in diameter.  No bony destructive change is seen.  There is no fracture.  Plantar calcaneal spur is noted.  IMPRESSION: Extensive soft tissue swelling compatible with cellulitis. Radiopaque foreign body in the plantar soft tissues is identified as described above.  No plain film evidence of osteomyelitis.   Original Report Authenticated By: Arvid Right. D'ALESSIO, M.D.     Scheduled Meds:   . acetaminophen  650 mg Oral Once  . folic acid  1 mg Oral Daily  . heparin  5,000 Units Subcutaneous Q8H  . imipenem-cilastatin  500 mg Intravenous Q8H  . influenza  inactive virus vaccine  0.5 mL Intramuscular Tomorrow-1000  . insulin aspart  0-15 Units Subcutaneous TID WC  . insulin aspart  0-5 Units Subcutaneous QHS  . insulin aspart protamine-insulin aspart  23 Units Subcutaneous BID WC  . LORazepam  0-4 mg Oral Q6H   Followed by  . LORazepam  0-4 mg Oral Q12H  . multivitamin with minerals  1 tablet Oral Daily  . thiamine  100 mg Oral Daily   Or  . thiamine  100 mg Intravenous Daily  . vancomycin  1,000 mg Intravenous Q24H  . DISCONTD: cefTRIAXone (ROCEPHIN)  IV  1 g Intravenous Q24H   Continuous Infusions:   . DISCONTD: sodium chloride 125 mL/hr at 08/21/12 1220    Principal Problem:  *Pseudomonas aeruginosa infection Active Problems:  Diabetes mellitus type 2, uncontrolled  Hypertension  Acute kidney injury  Alcohol dependency  Tear of skin of multiple sites of lower extremity  Nausea & vomiting  Diabetic foot ulcer  Leukocytosis       Raevon Broom  [facility] Pager [phone]. If 8PM-8AM, please contact night-coverage at [email or web address], password  [facility] 08/22/2012, 2:05 PM  LOS: 2 days

## 2012-08-23 DIAGNOSIS — M86679 Other chronic osteomyelitis, unspecified ankle and foot: Secondary | ICD-10-CM

## 2012-08-23 DIAGNOSIS — B965 Pseudomonas (aeruginosa) (mallei) (pseudomallei) as the cause of diseases classified elsewhere: Secondary | ICD-10-CM

## 2012-08-23 DIAGNOSIS — L02619 Cutaneous abscess of unspecified foot: Secondary | ICD-10-CM

## 2012-08-23 DIAGNOSIS — E11628 Type 2 diabetes mellitus with other skin complications: Secondary | ICD-10-CM

## 2012-08-23 DIAGNOSIS — L97409 Non-pressure chronic ulcer of unspecified heel and midfoot with unspecified severity: Secondary | ICD-10-CM

## 2012-08-23 LAB — IRON AND TIBC
Iron: 17 ug/dL — ABNORMAL LOW (ref 42–135)
Saturation Ratios: 10 % — ABNORMAL LOW (ref 20–55)
TIBC: 168 ug/dL — ABNORMAL LOW (ref 250–470)
UIBC: 151 ug/dL (ref 125–400)

## 2012-08-23 LAB — FERRITIN: Ferritin: 448 ng/mL — ABNORMAL HIGH (ref 10–291)

## 2012-08-23 LAB — GLUCOSE, CAPILLARY
Glucose-Capillary: 231 mg/dL — ABNORMAL HIGH (ref 70–99)
Glucose-Capillary: 171 mg/dL — ABNORMAL HIGH (ref 70–99)
Glucose-Capillary: 105 mg/dL — ABNORMAL HIGH (ref 70–99)
Glucose-Capillary: 89 mg/dL (ref 70–99)

## 2012-08-23 LAB — PREALBUMIN: Prealbumin: 7.6 mg/dL — ABNORMAL LOW (ref 17.0–34.0)

## 2012-08-23 MED ORDER — INFLUENZA VIRUS VACC SPLIT PF IM SUSP
0.5000 mL | INTRAMUSCULAR | Status: AC
  Administered 2012-08-24: 0.5 mL via INTRAMUSCULAR
  Filled 2012-08-23: qty 0.5

## 2012-08-23 MED ORDER — SODIUM CHLORIDE 0.9 % IV SOLN: INTRAVENOUS | Status: DC

## 2012-08-23 NOTE — ED Provider Notes (Signed)
Medical screening examination/treatment/procedure(s) were performed by non-physician practitioner and as supervising physician I was immediately available for consultation/collaboration.   Julianne Rice, MD 08/23/12 [phone]

## 2012-08-23 NOTE — Progress Notes (Signed)
INFECTIOUS DISEASE PROGRESS NOTE  ID: [name] is a 46 y.o. female with   Principal Problem:  *Pseudomonas aeruginosa infection Active Problems:  Diabetes mellitus type 2, uncontrolled  Hypertension  Acute kidney injury  Alcohol dependency  Tear of skin of multiple sites of lower extremity  Nausea & vomiting  Diabetic foot ulcer  Leukocytosis  Diabetic foot infection  Subjective: Without complaints  Abtx:  Anti-infectives     Start     Dose/Rate Route Frequency Ordered Stop   08/22/12 1415   imipenem-cilastatin (PRIMAXIN) 500 mg in sodium chloride 0.9 % 100 mL IVPB        500 mg 200 mL/hr over 30 Minutes Intravenous 3 times per day 08/22/12 1404     08/22/12 0500   vancomycin (VANCOCIN) IVPB 1000 mg/200 mL premix        1,000 mg 200 mL/hr over 60 Minutes Intravenous Every 24 hours 08/21/12 1045     08/21/12 0400   cefTRIAXone (ROCEPHIN) 1 g in dextrose 5 % 50 mL IVPB  Status:  Discontinued        1 g 100 mL/hr over 30 Minutes Intravenous Every 24 hours 08/21/12 0301 08/22/12 1404   08/21/12 0400   vancomycin (VANCOCIN) IVPB 1000 mg/200 mL premix  Status:  Discontinued        1,000 mg 200 mL/hr over 60 Minutes Intravenous Every 12 hours 08/21/12 0340 08/21/12 1045          Medications:  Scheduled:   . acetaminophen  650 mg Oral Once  . folic acid  1 mg Oral Daily  . heparin  5,000 Units Subcutaneous Q8H  . imipenem-cilastatin  500 mg Intravenous Q8H  . influenza  inactive virus vaccine  0.5 mL Intramuscular Tomorrow-1000  . influenza  inactive virus vaccine  0.5 mL Intramuscular Tomorrow-1000  . insulin aspart  0-15 Units Subcutaneous TID WC  . insulin aspart  0-5 Units Subcutaneous QHS  . insulin aspart protamine-insulin aspart  23 Units Subcutaneous BID WC  . LORazepam  0-4 mg Oral Q6H   Followed by  . LORazepam  0-4 mg Oral Q12H  . multivitamin with minerals  1 tablet Oral Daily  . thiamine  100 mg Oral Daily   Or  . thiamine  100 mg Intravenous  Daily  . vancomycin  1,000 mg Intravenous Q24H    Objective: Vital signs in last 24 hours: Temp:  [98.4 F (36.9 C)-98.8 F (37.1 C)] 98.4 F (36.9 C) (09/18 0528) Pulse Rate:  [87-92] 91  (09/18 0528) Resp:  [18] 18  (09/18 0528) BP: (131-154)/(66-80) 138/75 mmHg (09/18 0528) SpO2:  [100 %] 100 % (09/18 0528)   General appearance: alert, cooperative and no distress Extremities: multiple superfical wounds on bilateral LE. the R heal has several wounds that are deeper and foul smelling. there is bulging of the skin with palpation of the wounds but no purulent d/c can be expressed.   Lab Results  [facility] 08/22/12 0603 08/21/12 0843  WBC 24.0* 30.4*  HGB 7.3* 7.9*  HCT 23.8* 24.9*  NA 131* 129*  K 4.1 3.9  CL 100 95*  CO2 20 19  BUN 32* 27*  CREATININE 1.43* 1.56*  GLU -- --   Liver Panel No results found for this basename: PROT:2,ALBUMIN:2,AST:2,ALT:2,ALKPHOS:2,BILITOT:2,BILIDIR:2,IBILI:2 in the last 72 hours Sedimentation Rate No results found for this basename: ESRSEDRATE in the last 72 hours C-Reactive Protein No results found for this basename: CRP:2 in the last 72 hours  Microbiology: Recent  Results (from the past 240 hour(s))  CULTURE, BLOOD (ROUTINE X 2)     Status: Normal (Preliminary result)   Collection Time   08/20/12 10:45 PM      Component Value Range Status Comment   Specimen Description BLOOD LEFT ARM   Final    Special Requests     Final    Value: BOTTLES DRAWN AEROBIC AND ANAEROBIC 6CC BLUE 5CC RED   Culture  Setup Time 08/21/2012 14:17   Final    Culture     Final    Value:        BLOOD CULTURE RECEIVED NO GROWTH TO DATE CULTURE WILL BE HELD FOR 5 DAYS BEFORE ISSUING A FINAL NEGATIVE REPORT   Report Status PENDING   Incomplete   CULTURE, BLOOD (ROUTINE X 2)     Status: Normal (Preliminary result)   Collection Time   08/20/12 11:15 PM      Component Value Range Status Comment   Specimen Description BLOOD RIGHT HAND   Final    Special Requests  BOTTLES DRAWN AEROBIC AND ANAEROBIC 2CC EA   Final    Culture  Setup Time 08/21/2012 14:16   Final    Culture     Final    Value:        BLOOD CULTURE RECEIVED NO GROWTH TO DATE CULTURE WILL BE HELD FOR 5 DAYS BEFORE ISSUING A FINAL NEGATIVE REPORT   Report Status PENDING   Incomplete   CLOSTRIDIUM DIFFICILE BY PCR     Status: Normal   Collection Time   08/21/12  2:56 PM      Component Value Range Status Comment   C difficile by pcr NEGATIVE  NEGATIVE Final     Studies/Results: Mr Foot Right Wo Contrast  08/22/2012  *RADIOLOGY REPORT*  Clinical Data: Medial right mid foot wound.  Multiple previous irrigation & drainages and amputations.  Question osteomyelitis.  MRI OF THE RIGHT FOREFOOT WITHOUT CONTRAST  Technique:  Multiplanar, multisequence MR imaging was performed. No intravenous contrast was administered (no IV access).  Comparison: Radiographs 08/20/2012.  Findings: The study includes the midfoot and forefoot.  The hind foot is not imaged.  The area of the plantar foreign body adjacent to the calcaneal tuberosity demonstrated on the prior radiographs is not imaged.  There is dermal thickening and skin ulceration along the plantar aspect of the midfoot extending medially.  This abuts the plantar fascia but extends no deeper.  The signal alteration is most pronounced on the T1-weighted images where there is loss of fat signal.  On the T2-weighted images, there is only mild signal alteration, most consistent with granulation tissue or fibrosis. There is no drainable fluid collection.  There is dorsal subcutaneous edema which is greatest laterally. There is a small amount of ill-defined fluid along the dorsal aspect of the extensor muscles in the midfoot.  There is no significant extensor tenosynovitis or drainable fluid collection. There is a small amount of flexor tendon sheath fluid within the midfoot which is commonly seen.  The underlying forefoot musculature demonstrates atrophy and nonspecific  mild T2 hyperintensity, probably diabetic myopathy.  The bones of the forefoot and midfoot appear normal without evidence of osteomyelitis. The visualized calcaneus appears normal, although the tuberosity is not imaged.  IMPRESSION:  1.  Plantar midfoot skin ulceration with underlying subcutaneous inflammation/fibrosis.  No drainable fluid collection identified. 2.  Nonspecific dorsal subcutaneous edema in the forefoot. 3.  No deep soft tissue abscess, focal myositis or evidence of  osteomyelitis in the midfoot or forefoot. 4.  Note a previously demonstrated soft tissue foreign body in the plantar aspect of the hind foot is not imaged.   Original Report Authenticated By: Vivia Ewing, M.D.      Assessment/Plan: Multiple wounds Probable osteomyelitis Diabetes out of control Previous wound Cx pseudomonas Total days of antibiotics 3 (vanco/merrem)      Would continue her current anbx. I attempted to swab her R heal wound but only clear fluid was expressed (from a distal site!). I am not sure that a superficial wound Cx would be helpful.  Wound care consult done yesterday, ortho eval today. Ortho was able to visualize bone in her wound.      [name] Infectious Diseases [id number] 08/23/2012, 3:46 PM   LOS: 3 days

## 2012-08-23 NOTE — Consult Note (Addendum)
Reason for Consult: Ulcer abscess infection right calcaneus. Referring Physician:Tat  Vergie Maguire is an 46 y.o. female.  HPI:  Patient is a 46 year old woman with uncontrolled type 2 diabetes who presents with a chronic draining ulcer from the right heel. Patient states that she has previously been treated at [place].  Past Medical History  Diagnosis Date  . Diabetes mellitus type 2, uncontrolled   . Hypertension   . Anemia   . Alcohol dependency   . Diabetes mellitus     type 2  . Necrotizing fasciitis     Past Surgical History  Procedure Date  . Amputation     right first and left middle finger amputation 2/2 OM 08/2011 by Dr. Amedeo Plenty  . Cesarean section   . I&d left thumb 12/23/2011  . Tubal ligation   . I&d extremity 12/24/2011    Procedure: IRRIGATION AND DEBRIDEMENT EXTREMITY;  Surgeon: Tennis Must, MD;  Location: [place];  Service: Orthopedics;  Laterality: Left;  I&Dleft thumb with partial amputation  . I&d extremity 12/26/2011    Procedure: IRRIGATION AND DEBRIDEMENT EXTREMITY;  Surgeon: Tennis Must, MD;  Location: [place] [facility];  Service: Orthopedics;  Laterality: Left;  . I&d extremity 12/23/2011    Procedure: IRRIGATION AND DEBRIDEMENT EXTREMITY;  Surgeon: Tennis Must, MD;  Location: [place];  Service: Orthopedics;  Laterality: Left;  I&D Left thumb, hand and arm  . Amputation 01/18/2012    Procedure: AMPUTATION DIGIT;  Surgeon: Tennis Must, MD;  Location: [place];  Service: Orthopedics;  Laterality: Left;  revision amputation left thumb    Family History  Problem Relation Age of Onset  . Diabetes Mother   . Diabetes Father     Social History:  reports that she has never smoked. She has never used smokeless tobacco. She reports that she drinks about .6 ounces of alcohol per week. She reports that she does not use illicit drugs.  Allergies: No Known Allergies  Medications: I have reviewed the patient's current medications.  Results for  orders placed during the hospital encounter of 08/20/12 (from the past 48 hour(s))  GLUCOSE, CAPILLARY     Status: Abnormal   Collection Time   08/21/12 11:42 AM      Component Value Range Comment   Glucose-Capillary 244 (*) 70 - 99 mg/dL   CLOSTRIDIUM DIFFICILE BY PCR     Status: Normal   Collection Time   08/21/12  2:56 PM      Component Value Range Comment   C difficile by pcr NEGATIVE  NEGATIVE   GLUCOSE, CAPILLARY     Status: Abnormal   Collection Time   08/21/12  7:10 PM      Component Value Range Comment   Glucose-Capillary 174 (*) 70 - 99 mg/dL   GLUCOSE, CAPILLARY     Status: Abnormal   Collection Time   08/21/12 10:25 PM      Component Value Range Comment   Glucose-Capillary 168 (*) 70 - 99 mg/dL   BASIC METABOLIC PANEL     Status: Abnormal   Collection Time   08/22/12  6:03 AM      Component Value Range Comment   Sodium 131 (*) 135 - 145 mEq/L    Potassium 4.1  3.5 - 5.1 mEq/L    Chloride 100  96 - 112 mEq/L    CO2 20  19 - 32 mEq/L    Glucose, Bld 297 (*) 70 - 99 mg/dL  BUN 32 (*) 6 - 23 mg/dL    Creatinine, Ser 1.43 (*) 0.50 - 1.10 mg/dL    Calcium 8.1 (*) 8.4 - 10.5 mg/dL    GFR calc non Af Amer 44 (*) >90 mL/min    GFR calc Af Amer 51 (*) >90 mL/min   CBC     Status: Abnormal   Collection Time   08/22/12  6:03 AM      Component Value Range Comment   WBC 24.0 (*) 4.0 - 10.5 K/uL    RBC 3.31 (*) 3.87 - 5.11 MIL/uL    Hemoglobin 7.3 (*) 12.0 - 15.0 g/dL    HCT 23.8 (*) 36.0 - 46.0 %    MCV 71.9 (*) 78.0 - 100.0 fL    MCH 22.1 (*) 26.0 - 34.0 pg    MCHC 30.7  30.0 - 36.0 g/dL    RDW 14.4  11.5 - 15.5 %    Platelets 440 (*) 150 - 400 K/uL   HEMOGLOBIN A1C     Status: Abnormal   Collection Time   08/22/12  6:03 AM      Component Value Range Comment   Hemoglobin A1C 9.3 (*) <5.7 %    Mean Plasma Glucose 220 (*) <117 mg/dL   GLUCOSE, CAPILLARY     Status: Abnormal   Collection Time   08/22/12  6:51 AM      Component Value Range Comment   Glucose-Capillary  284 (*) 70 - 99 mg/dL   GLUCOSE, CAPILLARY     Status: Normal   Collection Time   08/22/12 11:54 AM      Component Value Range Comment   Glucose-Capillary 93  70 - 99 mg/dL   GLUCOSE, CAPILLARY     Status: Abnormal   Collection Time   08/22/12  4:37 PM      Component Value Range Comment   Glucose-Capillary 191 (*) 70 - 99 mg/dL   GLUCOSE, CAPILLARY     Status: Abnormal   Collection Time   08/22/12 10:09 PM      Component Value Range Comment   Glucose-Capillary 129 (*) 70 - 99 mg/dL   GLUCOSE, CAPILLARY     Status: Abnormal   Collection Time   08/23/12  7:06 AM      Component Value Range Comment   Glucose-Capillary 231 (*) 70 - 99 mg/dL   GLUCOSE, CAPILLARY     Status: Abnormal   Collection Time   08/23/12 10:52 AM      Component Value Range Comment   Glucose-Capillary 171 (*) 70 - 99 mg/dL     Mr Foot Right Wo Contrast  08/22/2012  *RADIOLOGY REPORT*  Clinical Data: Medial right mid foot wound.  Multiple previous irrigation & drainages and amputations.  Question osteomyelitis.  MRI OF THE RIGHT FOREFOOT WITHOUT CONTRAST  Technique:  Multiplanar, multisequence MR imaging was performed. No intravenous contrast was administered (no IV access).  Comparison: Radiographs 08/20/2012.  Findings: The study includes the midfoot and forefoot.  The hind foot is not imaged.  The area of the plantar foreign body adjacent to the calcaneal tuberosity demonstrated on the prior radiographs is not imaged.  There is dermal thickening and skin ulceration along the plantar aspect of the midfoot extending medially.  This abuts the plantar fascia but extends no deeper.  The signal alteration is most pronounced on the T1-weighted images where there is loss of fat signal.  On the T2-weighted images, there is only mild signal alteration, most consistent with granulation  tissue or fibrosis. There is no drainable fluid collection.  There is dorsal subcutaneous edema which is greatest laterally. There is a small amount of  ill-defined fluid along the dorsal aspect of the extensor muscles in the midfoot.  There is no significant extensor tenosynovitis or drainable fluid collection. There is a small amount of flexor tendon sheath fluid within the midfoot which is commonly seen.  The underlying forefoot musculature demonstrates atrophy and nonspecific mild T2 hyperintensity, probably diabetic myopathy.  The bones of the forefoot and midfoot appear normal without evidence of osteomyelitis. The visualized calcaneus appears normal, although the tuberosity is not imaged.  IMPRESSION:  1.  Plantar midfoot skin ulceration with underlying subcutaneous inflammation/fibrosis.  No drainable fluid collection identified. 2.  Nonspecific dorsal subcutaneous edema in the forefoot. 3.  No deep soft tissue abscess, focal myositis or evidence of osteomyelitis in the midfoot or forefoot. 4.  Note a previously demonstrated soft tissue foreign body in the plantar aspect of the hind foot is not imaged.   Original Report Authenticated By: Vivia Ewing, M.D.     Review of Systems  All other systems reviewed and are negative.   Blood pressure 138/75, pulse 91, temperature 98.4 F (36.9 C), temperature source Oral, resp. rate 18, height 5\' 6"  (1.676 m), weight 81.693 kg (180 lb 1.6 oz), last menstrual period 08/13/2012, SpO2 100.00%. Physical Exam On examination patient has a strong dorsalis pedis pulse. She has multiple ulcers on both lower extremities she does not have significant venous stasis changes but does have multiple ulcerations. Examination heel she has abscess ulcer which is approximately 5 cm in diameter the plantar aspect of the right heel. After informed consent the skin and soft tissue was debrided back to a necrotic tissue with the wound extending down to the calcaneus.   Assessment/Plan: Assessment: Necrotic ulcer right calcaneous most likely chronic osteomyelitis.  Plan will have wound care continue continue IV antibiotics  I will obtain an MRI scan to further evaluate the calcaneus. Patient may be a candidate for foot salvage surgery but due to the extensive nature of the ulcer she may require a transtibial amputation. I will followup after the MRI is obtained.: Radiology and see if they can just redo the current MRI scan. I am not sure why an MRI scan of the foot did not include the calcaneus.  DUDA,MARCUS V 08/23/2012, 11:32 AM

## 2012-08-23 NOTE — Progress Notes (Signed)
[facility] PROGRESS NOTE  [name] [id number] DOB: 15-Jul-1966 DOA: 08/20/2012 PCP: William Hamburger, MD  Assessment/Plan: Diabetic foot ulcer right lower extremity with surrounding cellulitis -  History of multiple resistant pathogens cultured from piror wounds  R-plantar calcaneal area with fluctuance Consult orthopedics for possible I&D MRI right lower extremity neg for osteomyelitis.  Antibiotics started with Vanc and Imipenem (9/17) Appreciate ID input .Tearof skin of multiple sites of lower extremity ? Self inflicted  Follow WOC recommendations .Acute kidney injury  IV fluid hydration - resolving  D/c metformin and ACE inhibitor   .Diabetes mellitus type 2, uncontrolled Stable; continue home insulin 70/30, follow CBGs ADA diet insulin scale insulin ordered  Pt not taking insulin x 2-3wks prior to admission  .Alcohol dependency patient denies however patient is in place on CIWA protocol  Anemia of chronic disease  Follow H/H, may need transfusion Check iron studies   Procedures/Studies: Mr Foot Right Wo Contrast  08/22/2012  *RADIOLOGY REPORT*  Clinical Data: Medial right mid foot wound.  Multiple previous irrigation & drainages and amputations.  Question osteomyelitis.  MRI OF THE RIGHT FOREFOOT WITHOUT CONTRAST  Technique:  Multiplanar, multisequence MR imaging was performed. No intravenous contrast was administered (no IV access).  Comparison: Radiographs 08/20/2012.  Findings: The study includes the midfoot and forefoot.  The hind foot is not imaged.  The area of the plantar foreign body adjacent to the calcaneal tuberosity demonstrated on the prior radiographs is not imaged.  There is dermal thickening and skin ulceration along the plantar aspect of the midfoot extending medially.  This abuts the plantar fascia but extends no deeper.  The signal alteration is most pronounced on the T1-weighted images where there is loss of fat signal.  On the T2-weighted  images, there is only mild signal alteration, most consistent with granulation tissue or fibrosis. There is no drainable fluid collection.  There is dorsal subcutaneous edema which is greatest laterally. There is a small amount of ill-defined fluid along the dorsal aspect of the extensor muscles in the midfoot.  There is no significant extensor tenosynovitis or drainable fluid collection. There is a small amount of flexor tendon sheath fluid within the midfoot which is commonly seen.  The underlying forefoot musculature demonstrates atrophy and nonspecific mild T2 hyperintensity, probably diabetic myopathy.  The bones of the forefoot and midfoot appear normal without evidence of osteomyelitis. The visualized calcaneus appears normal, although the tuberosity is not imaged.  IMPRESSION:  1.  Plantar midfoot skin ulceration with underlying subcutaneous inflammation/fibrosis.  No drainable fluid collection identified. 2.  Nonspecific dorsal subcutaneous edema in the forefoot. 3.  No deep soft tissue abscess, focal myositis or evidence of osteomyelitis in the midfoot or forefoot. 4.  Note a previously demonstrated soft tissue foreign body in the plantar aspect of the hind foot is not imaged.   Original Report Authenticated By: Vivia Ewing, M.D.    Dg Chest Portable 1 View  08/20/2012  *RADIOLOGY REPORT*  Clinical Data: 1-week history of nausea, vomiting, diarrhea.  PORTABLE CHEST - 1 VIEW  Comparison: 01/07/2012  Findings: Poor inspiratory effort.  No infiltrates or failure. Slight left base scarring.  Normal cardiomediastinal silhouette. No visible effusion or pneumothorax.  No change from priors.  IMPRESSION: Stable chest.   Original Report Authenticated By: Staci Righter, M.D.    Dg Foot Complete Right  08/20/2012  *RADIOLOGY REPORT*  Clinical Data: Nonhealing wound on the bottom of the foot.  RIGHT FOOT COMPLETE - 3+ VIEW  Comparison:  Plain films 01/07/2012.  Findings: There is extensive soft tissue  swelling about the foot. A radiopaque foreign body is identified in the subcutaneous tissues in the plantar aspect of the foot with an appearance most consistent with a piece of glass measuring 0.5 cm in diameter.  No bony destructive change is seen.  There is no fracture.  Plantar calcaneal spur is noted.  IMPRESSION: Extensive soft tissue swelling compatible with cellulitis. Radiopaque foreign body in the plantar soft tissues is identified as described above.  No plain film evidence of osteomyelitis.   Original Report Authenticated By: Arvid Right. Luther Parody, M.D.         Code Status: Full  Disposition Plan: Home when medically stable  Subjective: Patient feels well, better.  Denies f/c/ns, n/v/d, cp, sob, abdominal pain, dyspnea, dysuria  Objective: Filed Vitals:   08/22/12 1430 08/22/12 2207 08/23/12 0234 08/23/12 0528  BP: 160/93 154/80 131/66 138/75  Pulse: 89 92 87 91  Temp: 98.4 F (36.9 C) 98.6 F (37 C) 98.8 F (37.1 C) 98.4 F (36.9 C)  TempSrc:      Resp: 18 18 18 18   Height:      Weight:      SpO2: 97% 100% 100% 100%    Intake/Output Summary (Last 24 hours) at 08/23/12 0952 Last data filed at 08/23/12 0500  Gross per 24 hour  Intake   1400 ml  Output      4 ml  Net   1396 ml   Weight change:  Exam:   General:  Pt is alert, follows commands appropriately, not in acute distress  HEENT: No icterus, No thrush, No neck mass, [place]/AT  Cardiovascular: Regular rate and rhythm, S1/S2, no rubs, no gallops  Respiratory: Clear to auscultation bilaterally, no wheezing, no crackles, no rhonchi  Abdomen: Soft, non tender, non distended, bowel sounds present, no guarding  Extremities:  No lymphangitis, No petechiae, No rashes, no synovitis.  Right foot plantar calcaneal area with malodorous drainage thru pin hole, +fluctuance-no necrosis  Data Reviewed: Basic Metabolic Panel:  Lab [id number] 08/21/12 0843 08/20/12 1811  NA 131* 129* 130*  K 4.1 3.9 4.3  CL 100  95* 96  CO2 20 19 17*  GLUCOSE 297* 372* 216*  BUN 32* 27* 21  CREATININE 1.43* 1.56* 1.24*  CALCIUM 8.1* 8.4 8.9  MG -- -- --  PHOS -- -- --   Liver Function Tests: No results found for this basename: AST:5,ALT:5,ALKPHOS:5,BILITOT:5,PROT:5,ALBUMIN:5 in the last 168 hours No results found for this basename: LIPASE:5,AMYLASE:5 in the last 168 hours No results found for this basename: AMMONIA:5 in the last 168 hours CBC:  Lab 08/22/12 0603 08/21/12 0843 08/20/12 1811  WBC 24.0* 30.4* 24.4*  NEUTROABS -- -- 21.8*  HGB 7.3* 7.9* 8.7*  HCT 23.8* 24.9* 27.7*  MCV 71.9* 72.2* 73.5*  PLT 440* 424* 401*   Cardiac Enzymes: No results found for this basename: CKTOTAL:5,CKMB:5,CKMBINDEX:5,TROPONINI:5 in the last 168 hours BNP: No components found with this basename: POCBNP:5 CBG:  Lab 08/23/12 0706 08/22/12 2209 08/22/12 1637 08/22/12 1154 08/22/12 0651  GLUCAP 231* 129* 191* 93 284*    Recent Results (from the past 240 hour(s))  CULTURE, BLOOD (ROUTINE X 2)     Status: Normal (Preliminary result)   Collection Time   08/20/12 10:45 PM      Component Value Range Status Comment   Specimen Description BLOOD LEFT ARM   Final    Special Requests     Final  Value: BOTTLES DRAWN AEROBIC AND ANAEROBIC 6CC BLUE 5CC RED   Culture  Setup Time 08/21/2012 14:17   Final    Culture     Final    Value:        BLOOD CULTURE RECEIVED NO GROWTH TO DATE CULTURE WILL BE HELD FOR 5 DAYS BEFORE ISSUING A FINAL NEGATIVE REPORT   Report Status PENDING   Incomplete   CULTURE, BLOOD (ROUTINE X 2)     Status: Normal (Preliminary result)   Collection Time   08/20/12 11:15 PM      Component Value Range Status Comment   Specimen Description BLOOD RIGHT HAND   Final    Special Requests BOTTLES DRAWN AEROBIC AND ANAEROBIC 2CC EA   Final    Culture  Setup Time 08/21/2012 14:16   Final    Culture     Final    Value:        BLOOD CULTURE RECEIVED NO GROWTH TO DATE CULTURE WILL BE HELD FOR 5 DAYS BEFORE ISSUING  A FINAL NEGATIVE REPORT   Report Status PENDING   Incomplete   CLOSTRIDIUM DIFFICILE BY PCR     Status: Normal   Collection Time   08/21/12  2:56 PM      Component Value Range Status Comment   C difficile by pcr NEGATIVE  NEGATIVE Final      Scheduled Meds:   . acetaminophen  650 mg Oral Once  . folic acid  1 mg Oral Daily  . heparin  5,000 Units Subcutaneous Q8H  . imipenem-cilastatin  500 mg Intravenous Q8H  . influenza  inactive virus vaccine  0.5 mL Intramuscular Tomorrow-1000  . insulin aspart  0-15 Units Subcutaneous TID WC  . insulin aspart  0-5 Units Subcutaneous QHS  . insulin aspart protamine-insulin aspart  23 Units Subcutaneous BID WC  . LORazepam  0-4 mg Oral Q6H   Followed by  . LORazepam  0-4 mg Oral Q12H  . multivitamin with minerals  1 tablet Oral Daily  . thiamine  100 mg Oral Daily   Or  . thiamine  100 mg Intravenous Daily  . vancomycin  1,000 mg Intravenous Q24H  . DISCONTD: cefTRIAXone (ROCEPHIN)  IV  1 g Intravenous Q24H   Continuous Infusions:    Lizzete Gough, DO  [facility] Pager [phone]  If 7PM-7AM, please contact night-coverage [email or web address] Password [email or web address] 08/23/2012, 9:52 AM   LOS: 3 days

## 2012-08-24 ENCOUNTER — Inpatient Hospital Stay (HOSPITAL_COMMUNITY): Payer: Medicaid Other

## 2012-08-24 DIAGNOSIS — IMO0001 Reserved for inherently not codable concepts without codable children: Secondary | ICD-10-CM

## 2012-08-24 DIAGNOSIS — L089 Local infection of the skin and subcutaneous tissue, unspecified: Secondary | ICD-10-CM

## 2012-08-24 LAB — BASIC METABOLIC PANEL
BUN: 20 mg/dL (ref 6–23)
CO2: 23 mEq/L (ref 19–32)
Calcium: 8.2 mg/dL — ABNORMAL LOW (ref 8.4–10.5)
Chloride: 106 mEq/L (ref 96–112)
Creatinine, Ser: 0.89 mg/dL (ref 0.50–1.10)
GFR calc Af Amer: >90 mL/min (ref >90)
GFR calc non Af Amer: 78 mL/min — ABNORMAL LOW (ref >90)
Glucose, Bld: 190 mg/dL — ABNORMAL HIGH (ref 70–99)
Potassium: 4.2 mEq/L (ref 3.5–5.1)
Sodium: 136 mEq/L (ref 135–145)

## 2012-08-24 LAB — CBC
HCT: 23.7 % — ABNORMAL LOW (ref 36.0–46.0)
Hemoglobin: 7.3 g/dL — ABNORMAL LOW (ref 12.0–15.0)
MCH: 22.5 pg — ABNORMAL LOW (ref 26.0–34.0)
MCHC: 30.8 g/dL (ref 30.0–36.0)
MCV: 72.9 fL — ABNORMAL LOW (ref 78.0–100.0)
Platelets: 461 10*3/uL — ABNORMAL HIGH (ref 150–400)
RBC: 3.25 MIL/uL — ABNORMAL LOW (ref 3.87–5.11)
RDW: 14.8 % (ref 11.5–15.5)
WBC: 17.6 10*3/uL — ABNORMAL HIGH (ref 4.0–10.5)

## 2012-08-24 LAB — GLUCOSE, CAPILLARY
Glucose-Capillary: 152 mg/dL — ABNORMAL HIGH (ref 70–99)
Glucose-Capillary: 218 mg/dL — ABNORMAL HIGH (ref 70–99)
Glucose-Capillary: 197 mg/dL — ABNORMAL HIGH (ref 70–99)
Glucose-Capillary: 154 mg/dL — ABNORMAL HIGH (ref 70–99)

## 2012-08-24 LAB — VANCOMYCIN, TROUGH: Vancomycin Tr: 10.7 ug/mL (ref 10.0–20.0)

## 2012-08-24 MED ORDER — CHLORHEXIDINE GLUCONATE 4 % EX LIQD
60.0000 mL | Freq: Once | CUTANEOUS | Status: DC
  Filled 2012-08-24: qty 60

## 2012-08-24 MED ORDER — GADOBENATE DIMEGLUMINE 529 MG/ML IV SOLN
17.0000 mL | Freq: Once | INTRAVENOUS | Status: AC
  Administered 2012-08-24: 17 mL via INTRAVENOUS

## 2012-08-24 MED ORDER — SODIUM CHLORIDE 0.9 % IV SOLN
125.0000 mg | Freq: Once | INTRAVENOUS | Status: AC
  Administered 2012-08-24: 125 mg via INTRAVENOUS
  Filled 2012-08-24: qty 10

## 2012-08-24 MED ORDER — FERROUS SULFATE 325 (65 FE) MG PO TABS
325.0000 mg | ORAL_TABLET | Freq: Three times a day (TID) | ORAL | Status: DC
  Administered 2012-08-24 – 2012-08-30 (×14): 325 mg via ORAL
  Filled 2012-08-24 (×20): qty 1

## 2012-08-24 NOTE — Progress Notes (Signed)
Patient ID: [name], female   DOB: January 12, 1966, 46 y.o.   MRN: [id number] MRI scan is reviewed of the right calcaneus. This shows a large abscess and osteomyelitis of the calcaneus. Patient also has other ulcers throughout the midfoot. Due to the extensive area of involved soft tissue do not feel that there would be sufficient soft tissue coverage for partial calcaneal excision. Feel patient would benefit from a transtibial amputation. I have discussed this with the patient and will plan for a transtibial amputation tomorrow.

## 2012-08-24 NOTE — Progress Notes (Addendum)
ANTIBIOTIC CONSULT NOTE - FOLLOW UP  Pharmacy Consult for Vancomycin Indication: right foot diabetic ulcer  No Known Allergies  Patient Measurements: Height: 5\' 6"  (167.6 cm) (66inches) Weight: 180 lb 1.6 oz (81.693 kg) IBW/kg (Calculated) : 59.3   Vital Signs: Temp: 98.7 F (37.1 C) (09/19 0608) BP: 157/87 mmHg (09/19 0608) Pulse Rate: 89  (09/19 0608) Intake/Output from previous day: 09/18 0701 - 09/19 0700 In: 1220 [P.O.:1080; I.V.:140] Out: 3 [Urine:3] Intake/Output from this shift: Total I/O In: 240 [P.O.:240] Out: -   Labs:  Basename 08/24/12 0450 08/22/12 0603  WBC 17.6* 24.0*  HGB 7.3* 7.3*  PLT 461* 440*  LABCREA -- --  CREATININE 0.89 1.43*   Estimated Creatinine Clearance: 87 ml/min (by C-G formula based on Cr of 0.89).  Basename 08/24/12 0450  VANCOTROUGH 10.7  VANCOPEAK --  VANCORANDOM --  GENTTROUGH --  GENTPEAK --  GENTRANDOM --  TOBRATROUGH --  TOBRAPEAK --  TOBRARND --  AMIKACINPEAK --  AMIKACINTROU --  AMIKACIN --     Microbiology: Recent Results (from the past 720 hour(s))  CULTURE, BLOOD (ROUTINE X 2)     Status: Normal (Preliminary result)   Collection Time   08/20/12 10:45 PM      Component Value Range Status Comment   Specimen Description BLOOD LEFT ARM   Final    Special Requests     Final    Value: BOTTLES DRAWN AEROBIC AND ANAEROBIC 6CC BLUE 5CC RED   Culture  Setup Time 08/21/2012 14:17   Final    Culture     Final    Value:        BLOOD CULTURE RECEIVED NO GROWTH TO DATE CULTURE WILL BE HELD FOR 5 DAYS BEFORE ISSUING A FINAL NEGATIVE REPORT   Report Status PENDING   Incomplete   CULTURE, BLOOD (ROUTINE X 2)     Status: Normal (Preliminary result)   Collection Time   08/20/12 11:15 PM      Component Value Range Status Comment   Specimen Description BLOOD RIGHT HAND   Final    Special Requests BOTTLES DRAWN AEROBIC AND ANAEROBIC 2CC EA   Final    Culture  Setup Time 08/21/2012 14:16   Final    Culture     Final    Value:         BLOOD CULTURE RECEIVED NO GROWTH TO DATE CULTURE WILL BE HELD FOR 5 DAYS BEFORE ISSUING A FINAL NEGATIVE REPORT   Report Status PENDING   Incomplete   CLOSTRIDIUM DIFFICILE BY PCR     Status: Normal   Collection Time   08/21/12  2:56 PM      Component Value Range Status Comment   C difficile by pcr NEGATIVE  NEGATIVE Final     Anti-infectives     Start     Dose/Rate Route Frequency Ordered Stop   08/22/12 1415  imipenem-cilastatin (PRIMAXIN) 500 mg in sodium chloride 0.9 % 100 mL IVPB       500 mg 200 mL/hr over 30 Minutes Intravenous 3 times per day 08/22/12 1404     08/22/12 0500   vancomycin (VANCOCIN) IVPB 1000 mg/200 mL premix        1,000 mg 200 mL/hr over 60 Minutes Intravenous Every 24 hours 08/21/12 1045     08/21/12 0400   cefTRIAXone (ROCEPHIN) 1 g in dextrose 5 % 50 mL IVPB  Status:  Discontinued        1 g 100 mL/hr over 30 Minutes  Intravenous Every 24 hours 08/21/12 0301 08/22/12 1404   08/21/12 0400   vancomycin (VANCOCIN) IVPB 1000 mg/200 mL premix  Status:  Discontinued        1,000 mg 200 mL/hr over 60 Minutes Intravenous Every 12 hours 08/21/12 0340 08/21/12 1045          Assessment: 46 y/o female patient receiving day#3 vancomycin and day#2 primaxin for diabetic right foot ulcer. MRI negative for foot osteo. Scr much better today but not consistent with other values, error? Will check in am. Patient remains afebrile and wbc trend down. Obtained trough today =10.7. If osteo detected, will aim for higher trough.  Goal of Therapy:  Vancomycin trough level 10-15 mcg/ml  Plan:  Continue Vancomycin 1g IV q24h and will recheck bmet in am. Measure antibiotic drug levels at steady [id number], PharmD, BCPS Pager [phone] 08/24/2012,1:12 PM

## 2012-08-24 NOTE — Progress Notes (Signed)
INFECTIOUS DISEASE PROGRESS NOTE  ID: [name] is a 46 y.o. female with  Principal Problem:  *Pseudomonas aeruginosa infection Active Problems:  Diabetes mellitus type 2, uncontrolled  Hypertension  Acute kidney injury  Alcohol dependency  Tear of skin of multiple sites of lower extremity  Nausea & vomiting  Diabetic foot ulcer  Leukocytosis  Diabetic foot infection  Subjective: Without complaint  Abtx:  Anti-infectives     Start     Dose/Rate Route Frequency Ordered Stop   08/22/12 1415   imipenem-cilastatin (PRIMAXIN) 500 mg in sodium chloride 0.9 % 100 mL IVPB        500 mg 200 mL/hr over 30 Minutes Intravenous 3 times per day 08/22/12 1404     08/22/12 0500   vancomycin (VANCOCIN) IVPB 1000 mg/200 mL premix        1,000 mg 200 mL/hr over 60 Minutes Intravenous Every 24 hours 08/21/12 1045     08/21/12 0400   cefTRIAXone (ROCEPHIN) 1 g in dextrose 5 % 50 mL IVPB  Status:  Discontinued        1 g 100 mL/hr over 30 Minutes Intravenous Every 24 hours 08/21/12 0301 08/22/12 1404   08/21/12 0400   vancomycin (VANCOCIN) IVPB 1000 mg/200 mL premix  Status:  Discontinued        1,000 mg 200 mL/hr over 60 Minutes Intravenous Every 12 hours 08/21/12 0340 08/21/12 1045          Medications:  Scheduled:   . acetaminophen  650 mg Oral Once  . chlorhexidine  60 mL Topical Once  . folic acid  1 mg Oral Daily  . gadobenate dimeglumine  17 mL Intravenous Once  . heparin  5,000 Units Subcutaneous Q8H  . imipenem-cilastatin  500 mg Intravenous Q8H  . influenza  inactive virus vaccine  0.5 mL Intramuscular Tomorrow-1000  . insulin aspart  0-15 Units Subcutaneous TID WC  . insulin aspart  0-5 Units Subcutaneous QHS  . insulin aspart protamine-insulin aspart  23 Units Subcutaneous BID WC  . LORazepam  0-4 mg Oral Q12H  . multivitamin with minerals  1 tablet Oral Daily  . thiamine  100 mg Oral Daily   Or  . thiamine  100 mg Intravenous Daily  . vancomycin  1,000 mg  Intravenous Q24H    Objective: Vital signs in last 24 hours: Temp:  [98 F (36.7 C)-98.8 F (37.1 C)] 98.7 F (37.1 C) (09/19 1451) Pulse Rate:  [89-95] 95  (09/19 1451) Resp:  [18] 18  (09/19 1451) BP: (157-176)/(86-99) 169/88 mmHg (09/19 1451) SpO2:  [100 %] 100 % (09/19 1451)   General appearance: alert, cooperative and no distress Extremities: multiple dressed wounds, R foot wrapped. appreciable odor.   Lab Results  Basename 08/24/12 0450 08/22/12 0603  WBC 17.6* 24.0*  HGB 7.3* 7.3*  HCT 23.7* 23.8*  NA 136 131*  K 4.2 4.1  CL 106 100  CO2 23 20  BUN 20 32*  CREATININE 0.89 1.43*  GLU -- --   Liver Panel No results found for this basename: PROT:2,ALBUMIN:2,AST:2,ALT:2,ALKPHOS:2,BILITOT:2,BILIDIR:2,IBILI:2 in the last 72 hours Sedimentation Rate No results found for this basename: ESRSEDRATE in the last 72 hours C-Reactive Protein No results found for this basename: CRP:2 in the last 72 hours  Microbiology: Recent Results (from the past 240 hour(s))  CULTURE, BLOOD (ROUTINE X 2)     Status: Normal (Preliminary result)   Collection Time   08/20/12 10:45 PM      Component Value  Range Status Comment   Specimen Description BLOOD LEFT ARM   Final    Special Requests     Final    Value: BOTTLES DRAWN AEROBIC AND ANAEROBIC 6CC BLUE 5CC RED   Culture  Setup Time 08/21/2012 14:17   Final    Culture     Final    Value:        BLOOD CULTURE RECEIVED NO GROWTH TO DATE CULTURE WILL BE HELD FOR 5 DAYS BEFORE ISSUING A FINAL NEGATIVE REPORT   Report Status PENDING   Incomplete   CULTURE, BLOOD (ROUTINE X 2)     Status: Normal (Preliminary result)   Collection Time   08/20/12 11:15 PM      Component Value Range Status Comment   Specimen Description BLOOD RIGHT HAND   Final    Special Requests BOTTLES DRAWN AEROBIC AND ANAEROBIC 2CC EA   Final    Culture  Setup Time 08/21/2012 14:16   Final    Culture     Final    Value:        BLOOD CULTURE RECEIVED NO GROWTH TO DATE  CULTURE WILL BE HELD FOR 5 DAYS BEFORE ISSUING A FINAL NEGATIVE REPORT   Report Status PENDING   Incomplete   CLOSTRIDIUM DIFFICILE BY PCR     Status: Normal   Collection Time   08/21/12  2:56 PM      Component Value Range Status Comment   C difficile by pcr NEGATIVE  NEGATIVE Final     Studies/Results: No results found.   Assessment/Plan: Malnutrition Multiple wounds  Probable osteomyelitis  Diabetes out of control  Previous wound Cx pseudomonas  Total days of antibiotics 4 (vanco/merrem)  Would continue her current anbx. For trans-tibial amputation tomorrow. Needs improved nutrition.    [name] Infectious Diseases [id number] 08/24/2012, 3:22 PM   LOS: 4 days

## 2012-08-24 NOTE — Progress Notes (Signed)
[facility] PROGRESS NOTE  [name] [id number] DOB: 14-Jul-1966 DOA: 08/20/2012 PCP: William Hamburger, MD  Assessment/Plan:  Diabetic foot ulcer right lower extremity with surrounding cellulitis -  History of multiple resistant pathogens cultured from piror wounds  R-plantar calcaneal area with fluctuance  Appreciate orthopedics input Transtibial amputation planned for September 20 Continue Vanc(9/16) and Imipenem (9/17)  Appreciate ID input  .Acute kidney injury  Resolved with IV fluids Continue to withhold metformin Restart ACE inhibitor if renal function remains stable .Diabetes mellitus type 2, uncontrolled Stable; continue home insulin 70/30, follow CBGs  Hold AM insulin due to surgery ADA diet insulin scale insulin ordered  Pt not taking insulin x 2-3wks prior to admission  Check lipids, pre-albumin  .Alcohol dependency Continue thiamine Anemia of chronic disease /iron deficiency Follow H/H, may need transfusion  Start ferric gluconate, ferrous sulfate   Procedures/Studies: Mr Foot Right W Wo Contrast  08/22/2012  *RADIOLOGY REPORT*  Clinical Data: Medial right mid foot wound.  Multiple previous irrigation & drainages and amputations.  Question osteomyelitis.  MRI OF THE RIGHT FOREFOOT WITHOUT CONTRAST  Technique:  Multiplanar, multisequence MR imaging was performed. No intravenous contrast was administered (no IV access).  Comparison: Radiographs 08/20/2012.  Findings: The study includes the midfoot and forefoot.  The hind foot is not imaged.  The area of the plantar foreign body adjacent to the calcaneal tuberosity demonstrated on the prior radiographs is not imaged.  There is dermal thickening and skin ulceration along the plantar aspect of the midfoot extending medially.  This abuts the plantar fascia but extends no deeper.  The signal alteration is most pronounced on the T1-weighted images where there is loss of fat signal.  On the T2-weighted images, there  is only mild signal alteration, most consistent with granulation tissue or fibrosis. There is no drainable fluid collection.  There is dorsal subcutaneous edema which is greatest laterally. There is a small amount of ill-defined fluid along the dorsal aspect of the extensor muscles in the midfoot.  There is no significant extensor tenosynovitis or drainable fluid collection. There is a small amount of flexor tendon sheath fluid within the midfoot which is commonly seen.  The underlying forefoot musculature demonstrates atrophy and nonspecific mild T2 hyperintensity, probably diabetic myopathy.  The bones of the forefoot and midfoot appear normal without evidence of osteomyelitis. The visualized calcaneus appears normal, although the tuberosity is not imaged.  IMPRESSION:  1.  Plantar midfoot skin ulceration with underlying subcutaneous inflammation/fibrosis.  No drainable fluid collection identified. 2.  Nonspecific dorsal subcutaneous edema in the forefoot. 3.  No deep soft tissue abscess, focal myositis or evidence of osteomyelitis in the midfoot or forefoot. 4.  Note a previously demonstrated soft tissue foreign body in the plantar aspect of the hind foot is not imaged.   Original Report Authenticated By: Vivia Ewing, M.D.    Dg Chest Portable 1 View  08/20/2012  *RADIOLOGY REPORT*  Clinical Data: 1-week history of nausea, vomiting, diarrhea.  PORTABLE CHEST - 1 VIEW  Comparison: 01/07/2012  Findings: Poor inspiratory effort.  No infiltrates or failure. Slight left base scarring.  Normal cardiomediastinal silhouette. No visible effusion or pneumothorax.  No change from priors.  IMPRESSION: Stable chest.   Original Report Authenticated By: Staci Righter, M.D.    Dg Foot Complete Right  08/20/2012  *RADIOLOGY REPORT*  Clinical Data: Nonhealing wound on the bottom of the foot.  RIGHT FOOT COMPLETE - 3+ VIEW  Comparison: Plain films 01/07/2012.  Findings: There is  extensive soft tissue swelling about the  foot. A radiopaque foreign body is identified in the subcutaneous tissues in the plantar aspect of the foot with an appearance most consistent with a piece of glass measuring 0.5 cm in diameter.  No bony destructive change is seen.  There is no fracture.  Plantar calcaneal spur is noted.  IMPRESSION: Extensive soft tissue swelling compatible with cellulitis. Radiopaque foreign body in the plantar soft tissues is identified as described above.  No plain film evidence of osteomyelitis.   Original Report Authenticated By: Arvid Right. Luther Parody, M.D.     Code Status: Full  Disposition Plan: Home when medically stable  Subjective: Patient is feeling fine. She denies any fevers, chills, chest pain, shortness breath, nausea, vomiting, abdominal pain, dysuria, dizziness, headache.  Objective: Filed Vitals:   08/23/12 1626 08/23/12 2200 08/24/12 0608 08/24/12 1451  BP: 176/86 171/99 157/87 169/88  Pulse: 92 93 89 95  Temp: 98 F (36.7 C) 98.8 F (37.1 C) 98.7 F (37.1 C) 98.7 F (37.1 C)  TempSrc:  Oral    Resp: 18  18 18   Height:      Weight:      SpO2:  100% 100% 100%    Intake/Output Summary (Last 24 hours) at 08/24/12 1636 Last data filed at 08/24/12 1300  Gross per 24 hour  Intake    720 ml  Output      2 ml  Net    718 ml   Weight change:  Exam:   General:  Pt is alert, follows commands appropriately, not in acute distress  HEENT: No icterus, No thrush, , [place]/AT  Cardiovascular: Regular rate and rhythm, S1/S2, no rubs, no gallops  Respiratory: Clear to auscultation bilaterally, no wheezing, no crackles, no rhonchi  Abdomen: Soft, non tender, non distended, bowel sounds present, no guarding  Extremities:  No lymphangitis, No petechiae, No rashes, no synovitis; right plantar calcaneal area with small amount of malodorous drainage through a pin size hole, no crepitance, no necrosis  Data Reviewed: Basic Metabolic Panel:  Lab [id number] 0450 08/22/12 0603 08/21/12 0843  08/20/12 1811  NA 136 131* 129* 130*  K 4.2 4.1 3.9 4.3  CL 106 100 95* 96  CO2 23 20 19  17*  GLUCOSE 190* 297* 372* 216*  BUN 20 32* 27* 21  CREATININE 0.89 1.43* 1.56* 1.24*  CALCIUM 8.2* 8.1* 8.4 8.9  MG -- -- -- --  PHOS -- -- -- --   Liver Function Tests: No results found for this basename: AST:5,ALT:5,ALKPHOS:5,BILITOT:5,PROT:5,ALBUMIN:5 in the last 168 hours No results found for this basename: LIPASE:5,AMYLASE:5 in the last 168 hours No results found for this basename: AMMONIA:5 in the last 168 hours CBC:  Lab 08/24/12 0450 08/22/12 0603 08/21/12 0843 08/20/12 1811  WBC 17.6* 24.0* 30.4* 24.4*  NEUTROABS -- -- -- 21.8*  HGB 7.3* 7.3* 7.9* 8.7*  HCT 23.7* 23.8* 24.9* 27.7*  MCV 72.9* 71.9* 72.2* 73.5*  PLT 461* 440* 424* 401*   Cardiac Enzymes: No results found for this basename: CKTOTAL:5,CKMB:5,CKMBINDEX:5,TROPONINI:5 in the last 168 hours BNP: No components found with this basename: POCBNP:5 CBG:  Lab 08/24/12 1058 08/24/12 0709 08/23/12 2243 08/23/12 1637 08/23/12 1052  GLUCAP 218* 152* 89 105* 171*    Recent Results (from the past 240 hour(s))  CULTURE, BLOOD (ROUTINE X 2)     Status: Normal (Preliminary result)   Collection Time   08/20/12 10:45 PM      Component Value Range Status Comment   Specimen  Description BLOOD LEFT ARM   Final    Special Requests     Final    Value: BOTTLES DRAWN AEROBIC AND ANAEROBIC 6CC BLUE 5CC RED   Culture  Setup Time 08/21/2012 14:17   Final    Culture     Final    Value:        BLOOD CULTURE RECEIVED NO GROWTH TO DATE CULTURE WILL BE HELD FOR 5 DAYS BEFORE ISSUING A FINAL NEGATIVE REPORT   Report Status PENDING   Incomplete   CULTURE, BLOOD (ROUTINE X 2)     Status: Normal (Preliminary result)   Collection Time   08/20/12 11:15 PM      Component Value Range Status Comment   Specimen Description BLOOD RIGHT HAND   Final    Special Requests BOTTLES DRAWN AEROBIC AND ANAEROBIC 2CC EA   Final    Culture  Setup Time 08/21/2012  14:16   Final    Culture     Final    Value:        BLOOD CULTURE RECEIVED NO GROWTH TO DATE CULTURE WILL BE HELD FOR 5 DAYS BEFORE ISSUING A FINAL NEGATIVE REPORT   Report Status PENDING   Incomplete   CLOSTRIDIUM DIFFICILE BY PCR     Status: Normal   Collection Time   08/21/12  2:56 PM      Component Value Range Status Comment   C difficile by pcr NEGATIVE  NEGATIVE Final      Scheduled Meds:   . acetaminophen  650 mg Oral Once  . chlorhexidine  60 mL Topical Once  . folic acid  1 mg Oral Daily  . gadobenate dimeglumine  17 mL Intravenous Once  . heparin  5,000 Units Subcutaneous Q8H  . imipenem-cilastatin  500 mg Intravenous Q8H  . influenza  inactive virus vaccine  0.5 mL Intramuscular Tomorrow-1000  . insulin aspart  0-15 Units Subcutaneous TID WC  . insulin aspart  0-5 Units Subcutaneous QHS  . insulin aspart protamine-insulin aspart  23 Units Subcutaneous BID WC  . LORazepam  0-4 mg Oral Q12H  . multivitamin with minerals  1 tablet Oral Daily  . thiamine  100 mg Oral Daily   Or  . thiamine  100 mg Intravenous Daily  . vancomycin  1,000 mg Intravenous Q24H   Continuous Infusions:   . sodium chloride       [name] Huish, DO  [facility] Pager [phone]  If 7PM-7AM, please contact night-coverage [email or web address] Password [email or web address] 08/24/2012, 4:36 PM   LOS: 4 days

## 2012-08-25 ENCOUNTER — Encounter (HOSPITAL_COMMUNITY): Payer: Self-pay | Admitting: Anesthesiology

## 2012-08-25 ENCOUNTER — Encounter (HOSPITAL_COMMUNITY)
Admission: EM | Disposition: A | Discharge status: Skilled Nursing Facility | Payer: Self-pay | Source: Home / Self Care | Attending: Internal Medicine

## 2012-08-25 ENCOUNTER — Inpatient Hospital Stay (HOSPITAL_COMMUNITY): Payer: Medicaid Other | Admitting: Anesthesiology

## 2012-08-25 HISTORY — PX: AMPUTATION: SHX166

## 2012-08-25 LAB — BASIC METABOLIC PANEL
BUN: 16 mg/dL (ref 6–23)
BUN: 14 mg/dL (ref 6–23)
CO2: 23 mEq/L (ref 19–32)
CO2: 24 mEq/L (ref 19–32)
Calcium: 8.5 mg/dL (ref 8.4–10.5)
Calcium: 8.2 mg/dL — ABNORMAL LOW (ref 8.4–10.5)
Chloride: 102 mEq/L (ref 96–112)
Chloride: 107 mEq/L (ref 96–112)
Creatinine, Ser: 0.9 mg/dL (ref 0.50–1.10)
Creatinine, Ser: 0.83 mg/dL (ref 0.50–1.10)
GFR calc Af Amer: 89 mL/min — ABNORMAL LOW (ref >90)
GFR calc Af Amer: >90 mL/min (ref >90)
GFR calc non Af Amer: 77 mL/min — ABNORMAL LOW (ref >90)
GFR calc non Af Amer: 85 mL/min — ABNORMAL LOW (ref >90)
Glucose, Bld: 165 mg/dL — ABNORMAL HIGH (ref 70–99)
Glucose, Bld: 152 mg/dL — ABNORMAL HIGH (ref 70–99)
Potassium: 4 mEq/L (ref 3.5–5.1)
Potassium: 4.2 mEq/L (ref 3.5–5.1)
Sodium: 135 mEq/L (ref 135–145)
Sodium: 140 mEq/L (ref 135–145)

## 2012-08-25 LAB — CBC
HCT: 24.8 % — ABNORMAL LOW (ref 36.0–46.0)
HCT: 25.1 % — ABNORMAL LOW (ref 36.0–46.0)
Hemoglobin: 7.6 g/dL — ABNORMAL LOW (ref 12.0–15.0)
Hemoglobin: 7.7 g/dL — ABNORMAL LOW (ref 12.0–15.0)
MCH: 22.5 pg — ABNORMAL LOW (ref 26.0–34.0)
MCH: 22.4 pg — ABNORMAL LOW (ref 26.0–34.0)
MCHC: 30.6 g/dL (ref 30.0–36.0)
MCHC: 30.7 g/dL (ref 30.0–36.0)
MCV: 73.4 fL — ABNORMAL LOW (ref 78.0–100.0)
MCV: 73.2 fL — ABNORMAL LOW (ref 78.0–100.0)
Platelets: 464 10*3/uL — ABNORMAL HIGH (ref 150–400)
Platelets: 493 10*3/uL — ABNORMAL HIGH (ref 150–400)
RBC: 3.38 MIL/uL — ABNORMAL LOW (ref 3.87–5.11)
RBC: 3.43 MIL/uL — ABNORMAL LOW (ref 3.87–5.11)
RDW: 15 % (ref 11.5–15.5)
RDW: 14.9 % (ref 11.5–15.5)
WBC: 15.7 10*3/uL — ABNORMAL HIGH (ref 4.0–10.5)
WBC: 14.2 10*3/uL — ABNORMAL HIGH (ref 4.0–10.5)

## 2012-08-25 LAB — GLUCOSE, CAPILLARY
Glucose-Capillary: 199 mg/dL — ABNORMAL HIGH (ref 70–99)
Glucose-Capillary: 166 mg/dL — ABNORMAL HIGH (ref 70–99)
Glucose-Capillary: 140 mg/dL — ABNORMAL HIGH (ref 70–99)
Glucose-Capillary: 144 mg/dL — ABNORMAL HIGH (ref 70–99)
Glucose-Capillary: 153 mg/dL — ABNORMAL HIGH (ref 70–99)

## 2012-08-25 LAB — PREALBUMIN: Prealbumin: 10.2 mg/dL — ABNORMAL LOW (ref 17.0–34.0)

## 2012-08-25 LAB — LIPID PANEL
Cholesterol: 148 mg/dL (ref 0–200)
HDL: 46 mg/dL (ref >39)
LDL Cholesterol: 69 mg/dL (ref 0–99)
Total CHOL/HDL Ratio: 3.2 RATIO
Triglycerides: 163 mg/dL — ABNORMAL HIGH (ref <150)
VLDL: 33 mg/dL (ref 0–40)

## 2012-08-25 LAB — SURGICAL PCR SCREEN
MRSA, PCR: NEGATIVE
Staphylococcus aureus: POSITIVE — AB

## 2012-08-25 SURGERY — AMPUTATION BELOW KNEE
Anesthesia: General | Site: Leg Lower | Laterality: Right | Wound class: Dirty or Infected

## 2012-08-25 MED ORDER — HYDROMORPHONE HCL PF 1 MG/ML IJ SOLN
INTRAMUSCULAR | Status: AC
  Administered 2012-08-25: 0.5 mg via INTRAVENOUS
  Filled 2012-08-25: qty 1

## 2012-08-25 MED ORDER — ONDANSETRON HCL 4 MG/2ML IJ SOLN: 4.0000 mg | Freq: Once | INTRAMUSCULAR | Status: DC | PRN

## 2012-08-25 MED ORDER — MIDAZOLAM HCL 5 MG/5ML IJ SOLN
INTRAMUSCULAR | Status: DC | PRN
  Administered 2012-08-25: 2 mg via INTRAVENOUS

## 2012-08-25 MED ORDER — LACTATED RINGERS IV SOLN
INTRAVENOUS | Status: DC | PRN
  Administered 2012-08-25: 18:00:00 via INTRAVENOUS

## 2012-08-25 MED ORDER — HYDROMORPHONE HCL PF 1 MG/ML IJ SOLN
0.2500 mg | INTRAMUSCULAR | Status: DC | PRN
  Administered 2012-08-25 (×5): 0.5 mg via INTRAVENOUS

## 2012-08-25 MED ORDER — LISINOPRIL 10 MG PO TABS
10.0000 mg | ORAL_TABLET | Freq: Every day | ORAL | Status: DC
  Administered 2012-08-26 – 2012-08-30 (×5): 10 mg via ORAL
  Filled 2012-08-25 (×6): qty 1

## 2012-08-25 MED ORDER — HYDROCODONE-ACETAMINOPHEN 5-325 MG PO TABS
1.0000 | ORAL_TABLET | ORAL | Status: DC | PRN
  Administered 2012-08-26: 2 via ORAL
  Administered 2012-08-26: 1 via ORAL
  Administered 2012-08-27 – 2012-08-29 (×7): 2 via ORAL
  Filled 2012-08-25 (×8): qty 2

## 2012-08-25 MED ORDER — CEFAZOLIN SODIUM-DEXTROSE 2-3 GM-% IV SOLR
INTRAVENOUS | Status: DC | PRN
  Administered 2012-08-25: 2 g via INTRAVENOUS

## 2012-08-25 MED ORDER — SODIUM CHLORIDE 0.9 % IV SOLN
INTRAVENOUS | Status: DC
  Administered 2012-08-25: 20 mL/h via INTRAVENOUS

## 2012-08-25 MED ORDER — GLUCERNA SHAKE PO LIQD
237.0000 mL | Freq: Three times a day (TID) | ORAL | Status: DC
  Administered 2012-08-26 – 2012-08-30 (×15): 237 mL via ORAL

## 2012-08-25 MED ORDER — ONDANSETRON HCL 4 MG/2ML IJ SOLN
4.0000 mg | Freq: Four times a day (QID) | INTRAMUSCULAR | Status: DC | PRN
  Filled 2012-08-25: qty 2

## 2012-08-25 MED ORDER — HYDROMORPHONE HCL PF 1 MG/ML IJ SOLN: 0.2500 mg | INTRAMUSCULAR | Status: DC | PRN

## 2012-08-25 MED ORDER — METOCLOPRAMIDE HCL 5 MG/ML IJ SOLN
5.0000 mg | Freq: Three times a day (TID) | INTRAMUSCULAR | Status: DC | PRN
  Administered 2012-08-26 (×2): 10 mg via INTRAVENOUS
  Filled 2012-08-25 (×2): qty 2

## 2012-08-25 MED ORDER — VANCOMYCIN HCL 1000 MG IV SOLR
1250.0000 mg | INTRAVENOUS | Status: DC
  Administered 2012-08-26: 1250 mg via INTRAVENOUS
  Filled 2012-08-25 (×2): qty 1250

## 2012-08-25 MED ORDER — HYDROMORPHONE HCL PF 1 MG/ML IJ SOLN
INTRAMUSCULAR | Status: AC
  Filled 2012-08-25: qty 1

## 2012-08-25 MED ORDER — HYDROMORPHONE HCL PF 1 MG/ML IJ SOLN
0.5000 mg | INTRAMUSCULAR | Status: DC | PRN
  Administered 2012-08-25 – 2012-08-26 (×2): 1 mg via INTRAVENOUS
  Filled 2012-08-25 (×2): qty 1

## 2012-08-25 MED ORDER — MUPIROCIN 2 % EX OINT
TOPICAL_OINTMENT | Freq: Two times a day (BID) | CUTANEOUS | Status: DC
  Administered 2012-08-25 – 2012-08-30 (×10): via NASAL
  Filled 2012-08-25: qty 22

## 2012-08-25 MED ORDER — PROPOFOL 10 MG/ML IV BOLUS
INTRAVENOUS | Status: DC | PRN
  Administered 2012-08-25: 160 mg via INTRAVENOUS

## 2012-08-25 MED ORDER — CHLORHEXIDINE GLUCONATE CLOTH 2 % EX PADS
6.0000 | MEDICATED_PAD | Freq: Every day | CUTANEOUS | Status: DC
  Administered 2012-08-27 – 2012-08-29 (×3): 6 via TOPICAL

## 2012-08-25 MED ORDER — LIDOCAINE HCL (CARDIAC) 20 MG/ML IV SOLN
INTRAVENOUS | Status: DC | PRN
  Administered 2012-08-25: 100 mg via INTRAVENOUS

## 2012-08-25 MED ORDER — SODIUM CHLORIDE 0.9 % IV SOLN
500.0000 mg | Freq: Four times a day (QID) | INTRAVENOUS | Status: DC
  Administered 2012-08-26 (×3): 500 mg via INTRAVENOUS
  Filled 2012-08-25 (×8): qty 500

## 2012-08-25 MED ORDER — OXYCODONE-ACETAMINOPHEN 5-325 MG PO TABS: 1.0000 | ORAL_TABLET | ORAL | Status: DC | PRN

## 2012-08-25 MED ORDER — BENEPROTEIN PO POWD
1.0000 | Freq: Three times a day (TID) | ORAL | Status: DC
  Administered 2012-08-26 – 2012-08-30 (×12): 6 g via ORAL
  Filled 2012-08-25: qty 227

## 2012-08-25 MED ORDER — GABAPENTIN 300 MG PO CAPS
300.0000 mg | ORAL_CAPSULE | Freq: Three times a day (TID) | ORAL | Status: DC
  Administered 2012-08-25 – 2012-08-30 (×15): 300 mg via ORAL
  Filled 2012-08-25 (×19): qty 1

## 2012-08-25 MED ORDER — OXYCODONE HCL 5 MG PO TABS: 5.0000 mg | ORAL_TABLET | Freq: Once | ORAL | Status: DC | PRN

## 2012-08-25 MED ORDER — METOCLOPRAMIDE HCL 10 MG PO TABS: 5.0000 mg | ORAL_TABLET | Freq: Three times a day (TID) | ORAL | Status: DC | PRN

## 2012-08-25 MED ORDER — PROMETHAZINE HCL 25 MG/ML IJ SOLN: 6.2500 mg | INTRAMUSCULAR | Status: DC | PRN

## 2012-08-25 MED ORDER — OXYCODONE HCL 5 MG/5ML PO SOLN: 5.0000 mg | Freq: Once | ORAL | Status: DC | PRN

## 2012-08-25 MED ORDER — METHOCARBAMOL 100 MG/ML IJ SOLN
500.0000 mg | Freq: Four times a day (QID) | INTRAVENOUS | Status: DC | PRN
  Filled 2012-08-25: qty 5

## 2012-08-25 MED ORDER — ONDANSETRON HCL 4 MG PO TABS
4.0000 mg | ORAL_TABLET | Freq: Four times a day (QID) | ORAL | Status: DC | PRN
  Administered 2012-08-26 – 2012-08-27 (×2): 4 mg via ORAL
  Filled 2012-08-25 (×4): qty 1

## 2012-08-25 MED ORDER — METHOCARBAMOL 500 MG PO TABS
500.0000 mg | ORAL_TABLET | Freq: Four times a day (QID) | ORAL | Status: DC | PRN
  Administered 2012-08-25 – 2012-08-26 (×3): 500 mg via ORAL
  Filled 2012-08-25 (×4): qty 1

## 2012-08-25 MED ORDER — CEFAZOLIN SODIUM 1-5 GM-% IV SOLN
1.0000 g | Freq: Four times a day (QID) | INTRAVENOUS | Status: AC
  Administered 2012-08-25 – 2012-08-26 (×3): 1 g via INTRAVENOUS
  Filled 2012-08-25 (×4): qty 50

## 2012-08-25 MED ORDER — FENTANYL CITRATE 0.05 MG/ML IJ SOLN
INTRAMUSCULAR | Status: DC | PRN
  Administered 2012-08-25: 50 ug via INTRAVENOUS
  Administered 2012-08-25: 100 ug via INTRAVENOUS

## 2012-08-25 SURGICAL SUPPLY — 46 items
BANDAGE ELASTIC 4 VELCRO ST LF (GAUZE/BANDAGES/DRESSINGS) ×1 IMPLANT
BANDAGE ESMARK 6X9 LF (GAUZE/BANDAGES/DRESSINGS) ×1 IMPLANT
BANDAGE GAUZE ELAST BULKY 4 IN (GAUZE/BANDAGES/DRESSINGS) ×3 IMPLANT
BLADE SAW RECIP 87.9 MT (BLADE) ×2 IMPLANT
BLADE SURG 21 STRL SS (BLADE) ×2 IMPLANT
BNDG CMPR 9X6 STRL LF SNTH (GAUZE/BANDAGES/DRESSINGS) ×1
BNDG COHESIVE 6X5 TAN STRL LF (GAUZE/BANDAGES/DRESSINGS) ×4 IMPLANT
BNDG ESMARK 6X9 LF (GAUZE/BANDAGES/DRESSINGS) ×2
CLOTH BEACON ORANGE TIMEOUT ST (SAFETY) ×2 IMPLANT
COVER SURGICAL LIGHT HANDLE (MISCELLANEOUS) ×2 IMPLANT
CUFF TOURNIQUET SINGLE 34IN LL (TOURNIQUET CUFF) ×1 IMPLANT
CUFF TOURNIQUET SINGLE 44IN (TOURNIQUET CUFF) IMPLANT
DRAIN PENROSE 1/2X12 LTX STRL (WOUND CARE) IMPLANT
DRAPE EXTREMITY T 121X128X90 (DRAPE) ×2 IMPLANT
DRAPE PROXIMA HALF (DRAPES) ×4 IMPLANT
DRAPE U-SHAPE 47X51 STRL (DRAPES) ×4 IMPLANT
DRSG ADAPTIC 3X8 NADH LF (GAUZE/BANDAGES/DRESSINGS) ×2 IMPLANT
DRSG PAD ABDOMINAL 8X10 ST (GAUZE/BANDAGES/DRESSINGS) ×4 IMPLANT
DURAPREP 26ML APPLICATOR (WOUND CARE) ×2 IMPLANT
ELECT REM PT RETURN 9FT ADLT (ELECTROSURGICAL) ×2
ELECTRODE REM PT RTRN 9FT ADLT (ELECTROSURGICAL) ×1 IMPLANT
EVACUATOR 1/8 PVC DRAIN (DRAIN) IMPLANT
GLOVE BIOGEL PI IND STRL 9 (GLOVE) ×1 IMPLANT
GLOVE BIOGEL PI INDICATOR 9 (GLOVE) ×1
GLOVE SURG ORTHO 9.0 STRL STRW (GLOVE) ×2 IMPLANT
GOWN PREVENTION PLUS XLARGE (GOWN DISPOSABLE) ×2 IMPLANT
GOWN SRG XL XLNG 56XLVL 4 (GOWN DISPOSABLE) ×1 IMPLANT
GOWN STRL NON-REIN XL XLG LVL4 (GOWN DISPOSABLE) ×2
KIT BASIN OR (CUSTOM PROCEDURE TRAY) ×2 IMPLANT
KIT ROOM TURNOVER OR (KITS) ×2 IMPLANT
MANIFOLD NEPTUNE II (INSTRUMENTS) ×2 IMPLANT
NS IRRIG 1000ML POUR BTL (IV SOLUTION) ×2 IMPLANT
PACK GENERAL/GYN (CUSTOM PROCEDURE TRAY) ×2 IMPLANT
PAD ARMBOARD 7.5X6 YLW CONV (MISCELLANEOUS) ×4 IMPLANT
SPONGE GAUZE 4X4 12PLY (GAUZE/BANDAGES/DRESSINGS) ×2 IMPLANT
SPONGE LAP 18X18 X RAY DECT (DISPOSABLE) IMPLANT
STAPLER VISISTAT 35W (STAPLE) IMPLANT
STOCKINETTE IMPERVIOUS LG (DRAPES) ×2 IMPLANT
SUT PDS AB 1 CT  36 (SUTURE)
SUT PDS AB 1 CT 36 (SUTURE) IMPLANT
SUT SILK 2 0 (SUTURE) ×2
SUT SILK 2-0 18XBRD TIE 12 (SUTURE) ×1 IMPLANT
TOWEL OR 17X24 6PK STRL BLUE (TOWEL DISPOSABLE) ×2 IMPLANT
TOWEL OR 17X26 10 PK STRL BLUE (TOWEL DISPOSABLE) ×2 IMPLANT
TUBE ANAEROBIC SPECIMEN COL (MISCELLANEOUS) IMPLANT
WATER STERILE IRR 1000ML POUR (IV SOLUTION) ×2 IMPLANT

## 2012-08-25 NOTE — Anesthesia Postprocedure Evaluation (Signed)
  Anesthesia Post-op Note  Patient: [name]  Procedure(s) Performed: Procedure(s) (LRB) with comments: AMPUTATION BELOW KNEE (Right) - Right Below Knee Amputation  Patient Location: PACU  Anesthesia Type: General  Level of Consciousness: awake  Airway and Oxygen Therapy: Patient Spontanous Breathing  Post-op Pain: mild  Post-op Assessment: Post-op Vital signs reviewed, Patient's Cardiovascular Status Stable, Respiratory Function Stable, Patent Airway, No signs of Nausea or vomiting and Pain level controlled  Post-op Vital Signs: stable  Complications: No apparent anesthesia complications

## 2012-08-25 NOTE — Transfer of Care (Signed)
Immediate Anesthesia Transfer of Care Note  Patient: [name]  Procedure(s) Performed: Procedure(s) (LRB) with comments: AMPUTATION BELOW KNEE (Right) - Right Below Knee Amputation  Patient Location: PACU  Anesthesia Type: General  Level of Consciousness: awake, alert , oriented and patient cooperative  Airway & Oxygen Therapy: Patient Spontanous Breathing and Patient connected to face mask oxygen  Post-op Assessment: Report given to PACU RN and Post -op Vital signs reviewed and stable  Post vital signs: Reviewed and stable  Complications: No apparent anesthesia complications

## 2012-08-25 NOTE — Interval H&P Note (Signed)
History and Physical Interval Note:  08/25/2012 6:12 AM  [name]  has presented today for surgery, with the diagnosis of Osteomyelitis Right Calcaneous  The various methods of treatment have been discussed with the patient and family. After consideration of risks, benefits and other options for treatment, the patient has consented to  Procedure(s) (LRB) with comments: AMPUTATION BELOW KNEE (Right) - Right Below Knee Amputation as a surgical intervention .  The patient's history has been reviewed, patient examined, no change in status, stable for surgery.  I have reviewed the patient's chart and labs.  Questions were answered to the patient's satisfaction.     Lerin Jech V

## 2012-08-25 NOTE — Progress Notes (Signed)
[facility] PROGRESS NOTE  [name] [id number] DOB: 1966/07/09 DOA: 08/20/2012 PCP: William Hamburger, MD  Assessment/Plan:  Diabetic foot ulcer right lower extremity with surrounding cellulitis -  History of multiple resistant pathogens cultured from piror wounds  R-plantar calcaneal area with fluctuance  Appreciate orthopedics input  Transtibial amputation planned for September 20  Continue Vanc(9/16) and Imipenem (9/17)  Appreciate ID input--plan doxycycline at the time of discharge .Acute kidney injury  Resolved with IV fluids  Continue to withhold metformin  Lisinopril restarted  .Diabetes mellitus type 2, uncontrolled Stable; continue home insulin 70/30, follow CBGs  Hold AM insulin due to surgery  ADA diet insulin scale insulin ordered  Pt not taking insulin x 2-3wks prior to admission  Prealbumin 10.2 --start Glucerna and beneprotein  .Alcohol dependency Continue thiamine  Anemia of chronic disease /iron deficiency  Follow H/H, may need transfusion  -Ferric gluconate given September 19 -Continue ferrous sulfate 3 times a day   Disposition: Discharge home after surgery once cleared by orthopedics      Procedures/Studies: Mr Foot Right W Wo Contrast  08/22/2012  *RADIOLOGY REPORT*  Clinical Data: Medial right mid foot wound.  Multiple previous irrigation & drainages and amputations.  Question osteomyelitis.  MRI OF THE RIGHT FOREFOOT WITHOUT CONTRAST  Technique:  Multiplanar, multisequence MR imaging was performed. No intravenous contrast was administered (no IV access).  Comparison: Radiographs 08/20/2012.  Findings: The study includes the midfoot and forefoot.  The hind foot is not imaged.  The area of the plantar foreign body adjacent to the calcaneal tuberosity demonstrated on the prior radiographs is not imaged.  There is dermal thickening and skin ulceration along the plantar aspect of the midfoot extending medially.  This abuts the plantar fascia  but extends no deeper.  The signal alteration is most pronounced on the T1-weighted images where there is loss of fat signal.  On the T2-weighted images, there is only mild signal alteration, most consistent with granulation tissue or fibrosis. There is no drainable fluid collection.  There is dorsal subcutaneous edema which is greatest laterally. There is a small amount of ill-defined fluid along the dorsal aspect of the extensor muscles in the midfoot.  There is no significant extensor tenosynovitis or drainable fluid collection. There is a small amount of flexor tendon sheath fluid within the midfoot which is commonly seen.  The underlying forefoot musculature demonstrates atrophy and nonspecific mild T2 hyperintensity, probably diabetic myopathy.  The bones of the forefoot and midfoot appear normal without evidence of osteomyelitis. The visualized calcaneus appears normal, although the tuberosity is not imaged.  IMPRESSION:  1.  Plantar midfoot skin ulceration with underlying subcutaneous inflammation/fibrosis.  No drainable fluid collection identified. 2.  Nonspecific dorsal subcutaneous edema in the forefoot. 3.  No deep soft tissue abscess, focal myositis or evidence of osteomyelitis in the midfoot or forefoot. 4.  Note a previously demonstrated soft tissue foreign body in the plantar aspect of the hind foot is not imaged.   Original Report Authenticated By: Vivia Ewing, M.D.    Dg Chest Portable 1 View  08/20/2012  *RADIOLOGY REPORT*  Clinical Data: 1-week history of nausea, vomiting, diarrhea.  PORTABLE CHEST - 1 VIEW  Comparison: 01/07/2012  Findings: Poor inspiratory effort.  No infiltrates or failure. Slight left base scarring.  Normal cardiomediastinal silhouette. No visible effusion or pneumothorax.  No change from priors.  IMPRESSION: Stable chest.   Original Report Authenticated By: Staci Righter, M.D.    Dg Foot Complete Right  08/20/2012  *RADIOLOGY REPORT*  Clinical Data: Nonhealing  wound on the bottom of the foot.  RIGHT FOOT COMPLETE - 3+ VIEW  Comparison: Plain films 01/07/2012.  Findings: There is extensive soft tissue swelling about the foot. A radiopaque foreign body is identified in the subcutaneous tissues in the plantar aspect of the foot with an appearance most consistent with a piece of glass measuring 0.5 cm in diameter.  No bony destructive change is seen.  There is no fracture.  Plantar calcaneal spur is noted.  IMPRESSION: Extensive soft tissue swelling compatible with cellulitis. Radiopaque foreign body in the plantar soft tissues is identified as described above.  No plain film evidence of osteomyelitis.   Original Report Authenticated By: Arvid Right. Luther Parody, M.D.         Code Status: Full  Disposition Plan: Home when medically stable  Subjective: Patient is doing well. She denies any fevers, chills, chest pain, shortness of breath, nausea, vomiting, diarrhea, abdominal pain, foot pain.  Objective: Filed Vitals:   08/24/12 1451 08/24/12 2212 08/25/12 0101 08/25/12 0522  BP: 169/88 172/83 162/85 158/92  Pulse: 95 98 98 97  Temp: 98.7 F (37.1 C) 98.9 F (37.2 C) 98.8 F (37.1 C) 98.9 F (37.2 C)  TempSrc:      Resp: 18 18 16 16   Height:      Weight:      SpO2: 100% 98% 100% 100%    Intake/Output Summary (Last 24 hours) at 08/25/12 1432 Last data filed at 08/25/12 0500  Gross per 24 hour  Intake   1380 ml  Output      0 ml  Net   1380 ml   Weight change:  Exam:   General:  Pt is alert, follows commands appropriately, not in acute distress  HEENT: No icterus, No thrush, [place]/AT  Cardiovascular: Regular rate and rhythm, S1/S2, no rubs, no gallops  Respiratory: Clear to auscultation bilaterally, no wheezing, no crackles, no rhonchi  Abdomen: Soft, non tender, non distended, bowel sounds present, no guarding  Extremities: Left foot is dressed. No streaking lymphangitis or necrosis.  Data Reviewed: Basic Metabolic Panel:  Lab  [id number] 08/25/12 0530 08/24/12 0450 08/22/12 0603 08/21/12 0843  NA 140 135 136 131* 129*  K 4.2 4.0 4.2 4.1 3.9  CL 107 102 106 100 95*  CO2 24 23 23 20 19   GLUCOSE 152* 165* 190* 297* 372*  BUN 14 16 20  32* 27*  CREATININE 0.83 0.90 0.89 1.43* 1.56*  CALCIUM 8.2* 8.5 8.2* 8.1* 8.4  MG -- -- -- -- --  PHOS -- -- -- -- --   Liver Function Tests: No results found for this basename: AST:5,ALT:5,ALKPHOS:5,BILITOT:5,PROT:5,ALBUMIN:5 in the last 168 hours No results found for this basename: LIPASE:5,AMYLASE:5 in the last 168 hours No results found for this basename: AMMONIA:5 in the last 168 hours CBC:  Lab 08/25/12 1138 08/25/12 0530 08/24/12 0450 08/22/12 0603 08/21/12 0843 08/20/12 1811  WBC 14.2* 15.7* 17.6* 24.0* 30.4* --  NEUTROABS -- -- -- -- -- 21.8*  HGB 7.7* 7.6* 7.3* 7.3* 7.9* --  HCT 25.1* 24.8* 23.7* 23.8* 24.9* --  MCV 73.2* 73.4* 72.9* 71.9* 72.2* --  PLT 493* 464* 461* 440* 424* --   Cardiac Enzymes: No results found for this basename: CKTOTAL:5,CKMB:5,CKMBINDEX:5,TROPONINI:5 in the last 168 hours BNP: No components found with this basename: POCBNP:5 CBG:  Lab 08/25/12 0521 08/25/12 0100 08/24/12 2209 08/24/12 1724 08/24/12 1058  GLUCAP 166* 199* 154* 197* 218*    Recent  Results (from the past 240 hour(s))  CULTURE, BLOOD (ROUTINE X 2)     Status: Normal (Preliminary result)   Collection Time   08/20/12 10:45 PM      Component Value Range Status Comment   Specimen Description BLOOD LEFT ARM   Final    Special Requests     Final    Value: BOTTLES DRAWN AEROBIC AND ANAEROBIC 6CC BLUE 5CC RED   Culture  Setup Time 08/21/2012 14:17   Final    Culture     Final    Value:        BLOOD CULTURE RECEIVED NO GROWTH TO DATE CULTURE WILL BE HELD FOR 5 DAYS BEFORE ISSUING A FINAL NEGATIVE REPORT   Report Status PENDING   Incomplete   CULTURE, BLOOD (ROUTINE X 2)     Status: Normal (Preliminary result)   Collection Time   08/20/12 11:15 PM      Component Value  Range Status Comment   Specimen Description BLOOD RIGHT HAND   Final    Special Requests BOTTLES DRAWN AEROBIC AND ANAEROBIC 2CC EA   Final    Culture  Setup Time 08/21/2012 14:16   Final    Culture     Final    Value:        BLOOD CULTURE RECEIVED NO GROWTH TO DATE CULTURE WILL BE HELD FOR 5 DAYS BEFORE ISSUING A FINAL NEGATIVE REPORT   Report Status PENDING   Incomplete   CLOSTRIDIUM DIFFICILE BY PCR     Status: Normal   Collection Time   08/21/12  2:56 PM      Component Value Range Status Comment   C difficile by pcr NEGATIVE  NEGATIVE Final   SURGICAL PCR SCREEN     Status: Abnormal   Collection Time   08/25/12 12:24 AM      Component Value Range Status Comment   MRSA, PCR NEGATIVE  NEGATIVE Final    Staphylococcus aureus POSITIVE (*) NEGATIVE Final      Scheduled Meds:   . acetaminophen  650 mg Oral Once  . chlorhexidine  60 mL Topical Once  . Chlorhexidine Gluconate Cloth  6 each Topical Q0600  . feeding supplement  237 mL Oral TID WC  . ferric gluconate (FERRLECIT/NULECIT) IV  125 mg Intravenous Once  . ferrous sulfate  325 mg Oral TID WC  . folic acid  1 mg Oral Daily  . heparin  5,000 Units Subcutaneous Q8H  . imipenem-cilastatin  500 mg Intravenous Q6H  . insulin aspart  0-15 Units Subcutaneous TID WC  . insulin aspart  0-5 Units Subcutaneous QHS  . insulin aspart protamine-insulin aspart  23 Units Subcutaneous BID WC  . lisinopril  10 mg Oral Daily  . LORazepam  0-4 mg Oral Q12H  . multivitamin with minerals  1 tablet Oral Daily  . mupirocin ointment   Nasal BID  . protein supplement  1 scoop Oral TID WC  . thiamine  100 mg Oral Daily   Or  . thiamine  100 mg Intravenous Daily  . vancomycin  1,250 mg Intravenous Q24H  . DISCONTD: imipenem-cilastatin  500 mg Intravenous Q8H  . DISCONTD: vancomycin  1,000 mg Intravenous Q24H   Continuous Infusions:   . sodium chloride       [name] Derstine, DO  [facility] Pager [phone]  If 7PM-7AM, please contact  night-coverage [email or web address] Password [email or web address] 08/25/2012, 2:32 PM   LOS: 5 days

## 2012-08-25 NOTE — H&P (View-Only) (Signed)
Patient ID: [name], female   DOB: June 24, 1966, 46 y.o.   MRN: [id number] MRI scan is reviewed of the right calcaneus. This shows a large abscess and osteomyelitis of the calcaneus. Patient also has other ulcers throughout the midfoot. Due to the extensive area of involved soft tissue do not feel that there would be sufficient soft tissue coverage for partial calcaneal excision. Feel patient would benefit from a transtibial amputation. I have discussed this with the patient and will plan for a transtibial amputation tomorrow.

## 2012-08-25 NOTE — Preoperative (Signed)
Beta Blockers   Reason not to administer Beta Blockers:Not Applicable 

## 2012-08-25 NOTE — Anesthesia Preprocedure Evaluation (Addendum)
Anesthesia Evaluation  Patient identified by MRN, date of birth, ID band Patient awake    Reviewed: Allergy & Precautions, H&P , NPO status , Patient's Chart, lab work & pertinent test results  History of Anesthesia Complications Negative for: history of anesthetic complications  Airway Mallampati: I TM Distance: >3 FB Neck ROM: Full    Dental  (+) Edentulous Upper, Edentulous Lower and Dental Advisory Given   Pulmonary  breath sounds clear to auscultation        Cardiovascular hypertension, Pt. on medications Rhythm:Regular     Neuro/Psych    GI/Hepatic   Endo/Other  diabetes, Well Controlled, Type 2, Insulin Dependent  Renal/GU Renal disease     Musculoskeletal   Abdominal   Peds  Hematology   Anesthesia Other Findings   Reproductive/Obstetrics                         Anesthesia Physical Anesthesia Plan  ASA: III  Anesthesia Plan: General   Post-op Pain Management:    Induction: Intravenous  Airway Management Planned: LMA  Additional Equipment:   Intra-op Plan:   Post-operative Plan:   Informed Consent:   Dental advisory given  Plan Discussed with: CRNA and Anesthesiologist  Anesthesia Plan Comments:         Anesthesia Quick Evaluation

## 2012-08-25 NOTE — Anesthesia Procedure Notes (Signed)
Procedure Name: LMA Insertion Date/Time: 08/25/2012 6:43 PM Performed by: Luciana Axe K Pre-anesthesia Checklist: Patient identified, Emergency Drugs available, Suction available, Patient being monitored and Timeout performed Patient Re-evaluated:Patient Re-evaluated prior to inductionOxygen Delivery Method: Circle system utilized Preoxygenation: Pre-oxygenation with 100% oxygen Intubation Type: IV induction Ventilation: Mask ventilation without difficulty LMA: LMA inserted LMA Size: 4.0 Number of attempts: 1 Placement Confirmation: positive ETCO2 and breath sounds checked- equal and bilateral Tube secured with: Tape Dental Injury: Teeth and Oropharynx as per pre-operative assessment

## 2012-08-25 NOTE — Progress Notes (Signed)
INFECTIOUS DISEASE PROGRESS NOTE  ID: [name] is a 46 y.o. female with   Principal Problem:  *Pseudomonas aeruginosa infection Active Problems:  Diabetes mellitus type 2, uncontrolled  Hypertension  Acute kidney injury  Alcohol dependency  Tear of skin of multiple sites of lower extremity  Nausea & vomiting  Diabetic foot ulcer  Leukocytosis  Diabetic foot infection  Subjective: Without complaints  Abtx:  Anti-infectives     Start     Dose/Rate Route Frequency Ordered Stop   08/22/12 1415   imipenem-cilastatin (PRIMAXIN) 500 mg in sodium chloride 0.9 % 100 mL IVPB        500 mg 200 mL/hr over 30 Minutes Intravenous 3 times per day 08/22/12 1404     08/22/12 0500   vancomycin (VANCOCIN) IVPB 1000 mg/200 mL premix        1,000 mg 200 mL/hr over 60 Minutes Intravenous Every 24 hours 08/21/12 1045     08/21/12 0400   cefTRIAXone (ROCEPHIN) 1 g in dextrose 5 % 50 mL IVPB  Status:  Discontinued        1 g 100 mL/hr over 30 Minutes Intravenous Every 24 hours 08/21/12 0301 08/22/12 1404   08/21/12 0400   vancomycin (VANCOCIN) IVPB 1000 mg/200 mL premix  Status:  Discontinued        1,000 mg 200 mL/hr over 60 Minutes Intravenous Every 12 hours 08/21/12 0340 08/21/12 1045          Medications:  Scheduled:   . acetaminophen  650 mg Oral Once  . chlorhexidine  60 mL Topical Once  . Chlorhexidine Gluconate Cloth  6 each Topical Q0600  . ferric gluconate (FERRLECIT/NULECIT) IV  125 mg Intravenous Once  . ferrous sulfate  325 mg Oral TID WC  . folic acid  1 mg Oral Daily  . heparin  5,000 Units Subcutaneous Q8H  . imipenem-cilastatin  500 mg Intravenous Q8H  . insulin aspart  0-15 Units Subcutaneous TID WC  . insulin aspart  0-5 Units Subcutaneous QHS  . insulin aspart protamine-insulin aspart  23 Units Subcutaneous BID WC  . LORazepam  0-4 mg Oral Q12H  . multivitamin with minerals  1 tablet Oral Daily  . mupirocin ointment   Nasal BID  . thiamine  100 mg Oral  Daily   Or  . thiamine  100 mg Intravenous Daily  . vancomycin  1,000 mg Intravenous Q24H    Objective: Vital signs in last 24 hours: Temp:  [98.7 F (37.1 C)-98.9 F (37.2 C)] 98.9 F (37.2 C) (09/20 0522) Pulse Rate:  [95-98] 97  (09/20 0522) Resp:  [16-18] 16  (09/20 0522) BP: (158-172)/(83-92) 158/92 mmHg (09/20 0522) SpO2:  [98 %-100 %] 100 % (09/20 0522)   General appearance: alert, cooperative and no distress Extremities: wounds dressed.   Lab Results  Basename 08/25/12 1138 08/25/12 0530 08/24/12 0450  WBC 14.2* 15.7* --  HGB 7.7* 7.6* --  HCT 25.1* 24.8* --  NA -- 135 136  K -- 4.0 4.2  CL -- 102 106  CO2 -- 23 23  BUN -- 16 20  CREATININE -- 0.90 0.89  GLU -- -- --   Liver Panel No results found for this basename: PROT:2,ALBUMIN:2,AST:2,ALT:2,ALKPHOS:2,BILITOT:2,BILIDIR:2,IBILI:2 in the last 72 hours Sedimentation Rate No results found for this basename: ESRSEDRATE in the last 72 hours C-Reactive Protein No results found for this basename: CRP:2 in the last 72 hours  Microbiology: Recent Results (from the past 240 hour(s))  CULTURE, BLOOD (ROUTINE  X 2)     Status: Normal (Preliminary result)   Collection Time   08/20/12 10:45 PM      Component Value Range Status Comment   Specimen Description BLOOD LEFT ARM   Final    Special Requests     Final    Value: BOTTLES DRAWN AEROBIC AND ANAEROBIC 6CC BLUE 5CC RED   Culture  Setup Time 08/21/2012 14:17   Final    Culture     Final    Value:        BLOOD CULTURE RECEIVED NO GROWTH TO DATE CULTURE WILL BE HELD FOR 5 DAYS BEFORE ISSUING A FINAL NEGATIVE REPORT   Report Status PENDING   Incomplete   CULTURE, BLOOD (ROUTINE X 2)     Status: Normal (Preliminary result)   Collection Time   08/20/12 11:15 PM      Component Value Range Status Comment   Specimen Description BLOOD RIGHT HAND   Final    Special Requests BOTTLES DRAWN AEROBIC AND ANAEROBIC 2CC EA   Final    Culture  Setup Time 08/21/2012 14:16   Final     Culture     Final    Value:        BLOOD CULTURE RECEIVED NO GROWTH TO DATE CULTURE WILL BE HELD FOR 5 DAYS BEFORE ISSUING A FINAL NEGATIVE REPORT   Report Status PENDING   Incomplete   CLOSTRIDIUM DIFFICILE BY PCR     Status: Normal   Collection Time   08/21/12  2:56 PM      Component Value Range Status Comment   C difficile by pcr NEGATIVE  NEGATIVE Final   SURGICAL PCR SCREEN     Status: Abnormal   Collection Time   08/25/12 12:24 AM      Component Value Range Status Comment   MRSA, PCR NEGATIVE  NEGATIVE Final    Staphylococcus aureus POSITIVE (*) NEGATIVE Final     Studies/Results: No results found.   Assessment/Plan: Malnutrition  Multiple wounds  Probable osteomyelitis  Diabetes out of control  Previous wound Cx pseudomonas  Total days of antibiotics 5 (vanco/merrem)  Would continue her current anbx.  Plan to continue IV anbx while in hospital, home with doxy. Complete total of 2 weeks of anbx post surgery. For trans-tibial amputation today.  Needs improved nutrition.   Available if questions thanks  [name] Infectious Diseases [id number] 08/25/2012, 12:32 PM   LOS: 5 days

## 2012-08-25 NOTE — Op Note (Signed)
OPERATIVE REPORT  DATE OF SURGERY: 08/25/2012  PATIENT:  [name],  46 y.o. female  PRE-OPERATIVE DIAGNOSIS:  Osteomyelitis Right Calcaneous  POST-OPERATIVE DIAGNOSIS:  same  PROCEDURE:  Procedure(s): AMPUTATION BELOW KNEE  SURGEON:  Surgeon(s): Newt Minion, MD  ANESTHESIA:   general  EBL:  min ML  SPECIMEN:  Source of Specimen:  Right leg  TOURNIQUET:   Total Tourniquet Time Documented: area (laterality) - 8 minutes  PROCEDURE DETAILS: Patient is a 46 year old woman with diabetes peripheral vascular disease who presents with abscess ulceration osteomyelitis of the right calcaneus. She has failed conservative treatment with IV antibiotics and presents at this time for transtibial amputation. Risks and benefits were discussed including infection neurovascular injury nonhealing of the wound need for higher level amputation. Patient states she understands and wishes to proceed at this time. Description of procedure patient was brought to the operating room and underwent a general anesthetic. After adequate levels of anesthesia were obtained patient's right lower extremity was prepped using DuraPrep draped into a sterile field and the foot was draped out of sterile field so was not exposed to the surgical field with an impervious stockinette. A transverse incision was made 11 cm distal to the tibial tubercle this curved proximally and a large posterior flap was created. The tibia was transected just proximal to the skin incision beveled anteriorly. The fibula was transected just proximal to the tibial incision. A knife was used to create a large posterior flap. The sciatic nerve was pulled cut and allowed to retract. The vascular bundles were suture ligated with 2-0 silk. Tourniquet was deflated after 6 minutes hemostasis was obtained. The deep and superficial fascial layers were closed using #1 PDS the skin was closed using staples. Wound was covered with Adaptic orthopedic sponges AB  dressing Kerlix and Coban. Patient was extubated taken to the PACU in stable condition.  PLAN OF CARE: Admit to inpatient   PATIENT DISPOSITION:  PACU - hemodynamically stable.   Newt Minion, MD 08/25/2012 7:18 PM

## 2012-08-26 LAB — CBC
HCT: 24.9 % — ABNORMAL LOW (ref 36.0–46.0)
Hemoglobin: 7.6 g/dL — ABNORMAL LOW (ref 12.0–15.0)
MCH: 22.6 pg — ABNORMAL LOW (ref 26.0–34.0)
MCHC: 30.5 g/dL (ref 30.0–36.0)
MCV: 74.1 fL — ABNORMAL LOW (ref 78.0–100.0)
Platelets: 474 10*3/uL — ABNORMAL HIGH (ref 150–400)
RBC: 3.36 MIL/uL — ABNORMAL LOW (ref 3.87–5.11)
RDW: 15 % (ref 11.5–15.5)
WBC: 13.8 10*3/uL — ABNORMAL HIGH (ref 4.0–10.5)

## 2012-08-26 LAB — BASIC METABOLIC PANEL
BUN: 15 mg/dL (ref 6–23)
CO2: 25 mEq/L (ref 19–32)
Calcium: 8.7 mg/dL (ref 8.4–10.5)
Chloride: 106 mEq/L (ref 96–112)
Creatinine, Ser: 0.96 mg/dL (ref 0.50–1.10)
GFR calc Af Amer: 82 mL/min — ABNORMAL LOW (ref >90)
GFR calc non Af Amer: 71 mL/min — ABNORMAL LOW (ref >90)
Glucose, Bld: 189 mg/dL — ABNORMAL HIGH (ref 70–99)
Potassium: 4.9 mEq/L (ref 3.5–5.1)
Sodium: 142 mEq/L (ref 135–145)

## 2012-08-26 LAB — GLUCOSE, CAPILLARY
Glucose-Capillary: 162 mg/dL — ABNORMAL HIGH (ref 70–99)
Glucose-Capillary: 210 mg/dL — ABNORMAL HIGH (ref 70–99)
Glucose-Capillary: 220 mg/dL — ABNORMAL HIGH (ref 70–99)
Glucose-Capillary: 106 mg/dL — ABNORMAL HIGH (ref 70–99)

## 2012-08-26 MED ORDER — DOXYCYCLINE HYCLATE 100 MG PO TABS
100.0000 mg | ORAL_TABLET | Freq: Two times a day (BID) | ORAL | Status: DC
  Filled 2012-08-26: qty 1

## 2012-08-26 MED ORDER — SODIUM CHLORIDE 0.9 % IV SOLN
500.0000 mg | Freq: Four times a day (QID) | INTRAVENOUS | Status: DC
  Administered 2012-08-26 – 2012-08-29 (×12): 500 mg via INTRAVENOUS
  Filled 2012-08-26 (×16): qty 500

## 2012-08-26 MED ORDER — VANCOMYCIN HCL 1000 MG IV SOLR
1250.0000 mg | INTRAVENOUS | Status: DC
  Administered 2012-08-27 – 2012-08-29 (×3): 1250 mg via INTRAVENOUS
  Filled 2012-08-26 (×4): qty 1250

## 2012-08-26 NOTE — Evaluation (Signed)
Physical Therapy Evaluation Patient Details Name: [name] MRN: [id number] DOB: 1966-01-13 Today's Date: 08/26/2012 Time: [id number] PT Time Calculation (min): 24 min  PT Assessment / Plan / Recommendation Clinical Impression  Pt is 46 y/o female admitted for s/p right BKA.  Pt very motivated and willing to work.  Pt will benefit from acute PT services to improve overall mobility to prepare for safe d/c to next venue.   Pt will benefit from inpatient rehab to improve overall mobility, balance, strength, ambulation and prepare for prosthesis.    PT Assessment  Patient needs continued PT services    Follow Up Recommendations  Inpatient Rehab    Barriers to Discharge None      Equipment Recommendations  Rolling walker with 5" wheels;3 in 1 bedside comode    Recommendations for Other Services Rehab consult (SW consult for finanical assistance)   Frequency Min 5X/week    Precautions / Restrictions Precautions Precautions: Fall Restrictions Weight Bearing Restrictions: Yes RLE Weight Bearing: Non weight bearing   Pertinent Vitals/Pain 5/10 right LE pain       Mobility  Bed Mobility Bed Mobility: Sit to Supine Sit to Supine: 5: Supervision;HOB flat Details for Bed Mobility Assistance: supervision for safety Transfers Transfers: Sit to Stand;Stand to Sit Sit to Stand: 4: Min assist;From chair/3-in-1;With upper extremity assist Stand to Sit: 4: Min assist;To bed;With upper extremity assist Details for Transfer Assistance: VC for safe hand placement. Assist for steadying and balance. Ambulation/Gait Ambulation/Gait Assistance: 4: Min assist Ambulation Distance (Feet): 15 Feet Assistive device: Rolling walker Ambulation/Gait Assistance Details: (A) to maintain balance with cues for RW placement Gait Pattern: Step-to pattern    Exercises Amputee Exercises Quad Sets: Strengthening;Right;5 reps Knee Flexion: AAROM;Right;5 reps Straight Leg Raises: Strengthening;Right;5  reps   PT Diagnosis: Difficulty walking;Generalized weakness;Acute pain  PT Problem List: Decreased strength;Decreased range of motion;Decreased activity tolerance;Decreased balance;Decreased mobility;Decreased knowledge of use of DME;Pain PT Treatment Interventions: DME instruction;Gait training;Functional mobility training;Therapeutic activities;Therapeutic exercise;Balance training;Patient/family education   PT Goals Acute Rehab PT Goals PT Goal Formulation: With patient Time For Goal Achievement: 09/02/12 Potential to Achieve Goals: Good Pt will go Sit to Supine/Side: with modified independence PT Goal: Sit to Supine/Side - Progress: Goal set today Pt will go Sit to Stand: with supervision PT Goal: Sit to Stand - Progress: Goal set today Pt will go Stand to Sit: with supervision PT Goal: Stand to Sit - Progress: Goal set today Pt will Transfer Bed to Chair/Chair to Bed: with supervision PT Transfer Goal: Bed to Chair/Chair to Bed - Progress: Goal set today Pt will Ambulate: 16 - 50 feet;with supervision;with rolling walker PT Goal: Ambulate - Progress: Goal set today Pt will Perform Home Exercise Program: Independently PT Goal: Perform Home Exercise Program - Progress: Goal set today  Visit Information  Last PT Received On: 08/26/12 Assistance Needed: +1    Subjective Data  Subjective: "I'm feeling better since I've been up." Patient Stated Goal: To eventually go home   Prior Functioning  Home Living Lives With: Daughter Available Help at Discharge: Family;Available PRN/intermittently Type of Home: Apartment Home Access: Level entry Home Layout: Two level;Bed/bath upstairs Alternate Level Stairs-Number of Steps: 10 Alternate Level Stairs-Rails: Right;Left;Can reach both Bathroom Shower/Tub: Tub/shower unit;Curtain Bathroom Toilet: Standard Home Adaptive Equipment: None Prior Function Level of Independence: Independent Able to Take Stairs?: Yes Driving: No Dominant  Hand: Right    Cognition  Overall Cognitive Status: Appears within functional limits for tasks assessed/performed Arousal/Alertness: Awake/alert Orientation Level: Appears intact  for tasks assessed Behavior During Session: [facility] for tasks performed    Extremity/Trunk Assessment Right Upper Extremity Assessment RUE ROM/Strength/Tone: [facility] for tasks assessed;Deficits RUE ROM/Strength/Tone Deficits: index finger amputated several yrs ago Left Upper Extremity Assessment LUE ROM/Strength/Tone: WFL for tasks assessed;Deficits LUE ROM/Strength/Tone Deficits: Thumb ampuated in January 2013. Middle finger amputated several yrs ago. Right Lower Extremity Assessment RLE ROM/Strength/Tone: Deficits;Unable to fully assess;Due to pain Left Lower Extremity Assessment LLE ROM/Strength/Tone: Within functional levels   Balance    End of Session PT - End of Session Equipment Utilized During Treatment: Gait belt Activity Tolerance: Patient tolerated treatment well Patient left: in bed;with call bell/phone within reach Nurse Communication: Mobility status  GP     Tameisha Covell 08/26/2012, 1:02 PM Antoine Poche, [place] DPT [phone]

## 2012-08-26 NOTE — Progress Notes (Signed)
Occupational Therapy Evaluation Patient Details Name: [name] MRN: [id number] DOB: [birth year] Today's Date: 08/26/2012 Time: [id number] OT Time Calculation (min): 24 min  OT Assessment / Plan / Recommendation Clinical Impression  Pt s/p R BKA thus affecting PLOF. Will benefit from acute OT services to address below problem list in prep for d/c. Recommending CIR to further maximize independence with ADLs before return home.    OT Assessment  Patient needs continued OT Services    Follow Up Recommendations  Inpatient Rehab    Barriers to Discharge Decreased caregiver support;Inaccessible home environment bathroom is located upstairs. Daughter works during day.  Equipment Recommendations  Rolling walker with 5" wheels;3 in 1 bedside comode    Recommendations for Other Services    Frequency  Min 2X/week    Precautions / Restrictions Precautions Precautions: Fall Restrictions Weight Bearing Restrictions: Yes RLE Weight Bearing: Non weight bearing   Pertinent Vitals/Pain See vitals     ADL  Eating/Feeding: Performed;Independent Where Assessed - Eating/Feeding: Chair Grooming: Performed;Wash/dry hands;Min guard Where Assessed - Grooming: Supported standing Toilet Transfer: Pharmacologist Method: Sit to Loss adjuster, chartered:  (chair) Transfers/Ambulation Related to ADLs: min assist for steadying. Cueing for technique and sequencing. ADL Comments: Pt had spent several hours sitting up in chair prior to OT arrival and ready to return to bed. ADL assessment limited by fatigue.    OT Diagnosis: Generalized weakness;Acute pain  OT Problem List: Decreased activity tolerance;Impaired balance (sitting and/or standing);Decreased knowledge of use of DME or AE;Pain OT Treatment Interventions: Self-care/ADL training;DME and/or AE instruction;Therapeutic activities;Patient/family education;Balance training   OT Goals Acute Rehab OT Goals OT  Goal Formulation: With patient Time For Goal Achievement: 09/02/12 Potential to Achieve Goals: Good ADL Goals Pt Will Perform Grooming: with modified independence;Standing at sink ADL Goal: Grooming - Progress: Goal set today Pt Will Perform Lower Body Dressing: with modified independence;Sit to stand from chair;Sit to stand from bed;with adaptive equipment ADL Goal: Lower Body Dressing - Progress: Goal set today Pt Will Transfer to Toilet: with modified independence;Ambulation;with DME;Comfort height toilet;Maintaining weight bearing status ADL Goal: Toilet Transfer - Progress: Goal set today Pt Will Perform Toileting - Clothing Manipulation: with modified independence;Standing;Sitting on 3-in-1 or toilet ADL Goal: Toileting - Clothing Manipulation - Progress: Goal set today Pt Will Perform Toileting - Hygiene: with modified independence;Standing at 3-in-1/toilet;Sit to stand from 3-in-1/toilet ADL Goal: Toileting - Hygiene - Progress: Goal set today  Visit Information  Last OT Received On: 08/26/12 Assistance Needed: +1    Subjective Data      Prior Functioning  Vision/Perception  Home Living Lives With: Daughter Available Help at Discharge: Family;Available PRN/intermittently Type of Home: Apartment Home Access: Level entry Home Layout: Two level;Bed/bath upstairs Alternate Level Stairs-Number of Steps: 10 Alternate Level Stairs-Rails: Right;Left;Can reach both Bathroom Shower/Tub: Tub/shower unit;Curtain Bathroom Toilet: Standard Home Adaptive Equipment: None Prior Function Level of Independence: Independent Able to Take Stairs?: Yes Driving: No Dominant Hand: Right      Cognition  Overall Cognitive Status: Appears within functional limits for tasks assessed/performed Arousal/Alertness: Awake/alert Orientation Level: Appears intact for tasks assessed Behavior During Session: [facility] for tasks performed    Extremity/Trunk Assessment Right Upper Extremity  Assessment RUE ROM/Strength/Tone: [facility] for tasks assessed;Deficits RUE ROM/Strength/Tone Deficits: index finger amputated several yrs ago Left Upper Extremity Assessment LUE ROM/Strength/Tone: WFL for tasks assessed;Deficits LUE ROM/Strength/Tone Deficits: Thumb ampuated in January 2013. Middle finger amputated several yrs ago.   Mobility  Shoulder Instructions  Bed Mobility Bed Mobility:  Sit to Supine Sit to Supine: 5: Supervision;HOB flat Transfers Transfers: Sit to Stand;Stand to Sit Sit to Stand: 4: Min assist;From chair/3-in-1;With upper extremity assist Stand to Sit: 4: Min assist;To bed;With upper extremity assist Details for Transfer Assistance: VC for safe hand placement. Assist for steadying and balance.       Exercise     Balance     End of Session OT - End of Session Equipment Utilized During Treatment: Gait belt Activity Tolerance: Patient tolerated treatment well Patient left: in bed;with call bell/phone within reach Nurse Communication: Mobility status  GO    08/26/2012 Darrol Jump OTR/L Pager [phone] Office [phone]  Darrol Jump 08/26/2012, 4:52 PM

## 2012-08-26 NOTE — Progress Notes (Signed)
Pt.states has discussed D/C plans with family and has decided to go to faclity - not home.

## 2012-08-26 NOTE — Progress Notes (Signed)
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  S/p BKA (9/20) Appreciate orthopedics Continue Vanc(9/16) and Imipenem (9/17)  Appreciate ID input--plan doxycycline at the time of discharge  Consult inpatient rehabilitation .Acute kidney injury  Resolved with IV fluids  Continue to withhold metformin  Lisinopril restarted  .Diabetes mellitus type 2, uncontrolled Stable; continue home insulin 70/30, follow CBGs  ADA diet insulin scale insulin ordered  Pt not taking insulin x 2-3wks prior to admission  Prealbumin 10.2 --start Glucerna and beneprotein  .Alcohol dependency Continue thiamine  Anemia of chronic disease /iron deficiency  Follow H/H, may need transfusion  -Ferric gluconate given September 19  -Continue ferrous sulfate 3 times a day    Disposition Plan:   CIR if accepted       Procedures/Studies: Mr Foot Right W Wo Contrast  08/22/2012  *RADIOLOGY REPORT*  Clinical Data: Medial right mid foot wound.  Multiple previous irrigation & drainages and amputations.  Question osteomyelitis.  MRI OF THE RIGHT FOREFOOT WITHOUT CONTRAST  Technique:  Multiplanar, multisequence MR imaging was performed. No intravenous contrast was administered (no IV access).  Comparison: Radiographs 08/20/2012.  Findings: The study includes the midfoot and forefoot.  The hind foot is not imaged.  The area of the plantar foreign body adjacent to the calcaneal tuberosity demonstrated on the prior radiographs is not imaged.  There is dermal thickening and skin ulceration along the plantar aspect of the midfoot extending medially.  This abuts the plantar fascia but extends no deeper.  The signal alteration is most pronounced on the T1-weighted images where there is loss of  fat signal.  On the T2-weighted images, there is only mild signal alteration, most consistent with granulation tissue or fibrosis. There is no drainable fluid collection.  There is dorsal subcutaneous edema which is greatest laterally. There is a small amount of ill-defined fluid along the dorsal aspect of the extensor muscles in the midfoot.  There is no significant extensor tenosynovitis or drainable fluid collection. There is a small amount of flexor tendon sheath fluid within the midfoot which is commonly seen.  The underlying forefoot musculature demonstrates atrophy and nonspecific mild T2 hyperintensity, probably diabetic myopathy.  The bones of the forefoot and midfoot appear normal without evidence of osteomyelitis. The visualized calcaneus appears normal, although the tuberosity is not imaged.  IMPRESSION:  1.  Plantar midfoot skin ulceration with underlying subcutaneous inflammation/fibrosis.  No drainable fluid collection identified. 2.  Nonspecific dorsal subcutaneous edema in the forefoot. 3.  No deep soft tissue abscess, focal myositis or evidence of osteomyelitis in the midfoot or forefoot. 4.  Note a previously demonstrated soft tissue foreign body in the plantar aspect of the hind foot is not imaged.   Original Report Authenticated By: Vivia Ewing, M.D.    Dg Chest Portable 1 View  08/20/2012  *RADIOLOGY REPORT*  Clinical Data: 1-week history of nausea, vomiting, diarrhea.  PORTABLE CHEST - 1 VIEW  Comparison: 01/07/2012  Findings: Poor inspiratory effort.  No infiltrates or failure. Slight left base scarring.  Normal cardiomediastinal silhouette. No visible effusion or pneumothorax.  No change from priors.  IMPRESSION: Stable chest.   Original Report Authenticated By: Staci Righter, M.D.    Dg Foot Complete Right  08/20/2012  *RADIOLOGY REPORT*  Clinical Data: Nonhealing wound on the bottom of the  foot.  RIGHT FOOT COMPLETE - 3+ VIEW  Comparison: Plain films 01/07/2012.  Findings:  There is extensive soft tissue swelling about the foot. A radiopaque foreign body is identified in the subcutaneous tissues in the plantar aspect of the foot with an appearance most consistent with a piece of glass measuring 0.5 cm in diameter.  No bony destructive change is seen.  There is no fracture.  Plantar calcaneal spur is noted.  IMPRESSION: Extensive soft tissue swelling compatible with cellulitis. Radiopaque foreign body in the plantar soft tissues is identified as described above.  No plain film evidence of osteomyelitis.   Original Report Authenticated By: Arvid Right. Luther Parody, M.D.          Subjective: Patient complaint of vomiting last night but it is better today. She is tolerating her Glucerna shakes. She denies any fevers, chills, chest pain, shortness breath,, abdominal pain, headache, visual changes.  Objective: Filed Vitals:   08/26/12 0153 08/26/12 0517 08/26/12 1239 08/26/12 1400  BP: 186/109 173/92 128/88 146/71  Pulse: 88 95  95  Temp: 98.2 F (36.8 C) 98.3 F (36.8 C)  98 F (36.7 C)  TempSrc:    Oral  Resp: 16 16  18   Height:      Weight:      SpO2: 98% 100%  100%    Intake/Output Summary (Last 24 hours) at 08/26/12 1528 Last data filed at 08/25/12 1915  Gross per 24 hour  Intake    700 ml  Output      0 ml  Net    700 ml   Weight change:  Exam:   General:  Pt is alert, follows commands appropriately, not in acute distress  HEENT: No icterus, No thrush, No neck mass, [place]/AT  Cardiovascular: RRR, S1/S2, no rubs, no gallops  Respiratory: Clear to auscultation bilaterally, no wheezing, no crackles, no rhonchi  Abdomen: Soft, non tender, non distended, bowel sounds present, no guarding  Extremities:  No lymphangitis, No petechiae, No rashes, no synovitis; right BKA site is wrapped tightly without rectal bleeding or crepitance.  Data Reviewed: Basic Metabolic Panel:  Lab [id number] 0530 08/25/12 1138 08/25/12 0530 08/24/12 0450 08/22/12 0603  NA  142 140 135 136 131*  K 4.9 4.2 4.0 4.2 4.1  CL 106 107 102 106 100  CO2 25 24 23 23 20   GLUCOSE 189* 152* 165* 190* 297*  BUN 15 14 16 20  32*  CREATININE 0.96 0.83 0.90 0.89 1.43*  CALCIUM 8.7 8.2* 8.5 8.2* 8.1*  MG -- -- -- -- --  PHOS -- -- -- -- --   Liver Function Tests: No results found for this basename: AST:5,ALT:5,ALKPHOS:5,BILITOT:5,PROT:5,ALBUMIN:5 in the last 168 hours No results found for this basename: LIPASE:5,AMYLASE:5 in the last 168 hours No results found for this basename: AMMONIA:5 in the last 168 hours CBC:  Lab 08/26/12 0530 08/25/12 1138 08/25/12 0530 08/24/12 0450 08/22/12 0603 08/20/12 1811  WBC 13.8* 14.2* 15.7* 17.6* 24.0* --  NEUTROABS -- -- -- -- -- 21.8*  HGB 7.6* 7.7* 7.6* 7.3* 7.3* --  HCT 24.9* 25.1* 24.8* 23.7* 23.8* --  MCV 74.1* 73.2* 73.4* 72.9* 71.9* --  PLT 474* 493* 464* 461* 440* --   Cardiac Enzymes: No results found for this basename: CKTOTAL:5,CKMB:5,CKMBINDEX:5,TROPONINI:5 in the last 168 hours BNP: No components found with this basename: POCBNP:5 CBG:  Lab 08/26/12 1136 08/26/12 0708 08/25/12 2202 08/25/12 1823 08/25/12 1116  GLUCAP 210* 162* 153* 144* 140*    Recent Results (from the past  240 hour(s))  CULTURE, BLOOD (ROUTINE X 2)     Status: Normal (Preliminary result)   Collection Time   08/20/12 10:45 PM      Component Value Range Status Comment   Specimen Description BLOOD LEFT ARM   Final    Special Requests     Final    Value: BOTTLES DRAWN AEROBIC AND ANAEROBIC 6CC BLUE 5CC RED   Culture  Setup Time 08/21/2012 14:17   Final    Culture     Final    Value:        BLOOD CULTURE RECEIVED NO GROWTH TO DATE CULTURE WILL BE HELD FOR 5 DAYS BEFORE ISSUING A FINAL NEGATIVE REPORT   Report Status PENDING   Incomplete   CULTURE, BLOOD (ROUTINE X 2)     Status: Normal (Preliminary result)   Collection Time   08/20/12 11:15 PM      Component Value Range Status Comment   Specimen Description BLOOD RIGHT HAND   Final    Special  Requests BOTTLES DRAWN AEROBIC AND ANAEROBIC 2CC EA   Final    Culture  Setup Time 08/21/2012 14:16   Final    Culture     Final    Value:        BLOOD CULTURE RECEIVED NO GROWTH TO DATE CULTURE WILL BE HELD FOR 5 DAYS BEFORE ISSUING A FINAL NEGATIVE REPORT   Report Status PENDING   Incomplete   CLOSTRIDIUM DIFFICILE BY PCR     Status: Normal   Collection Time   08/21/12  2:56 PM      Component Value Range Status Comment   C difficile by pcr NEGATIVE  NEGATIVE Final   SURGICAL PCR SCREEN     Status: Abnormal   Collection Time   08/25/12 12:24 AM      Component Value Range Status Comment   MRSA, PCR NEGATIVE  NEGATIVE Final    Staphylococcus aureus POSITIVE (*) NEGATIVE Final      Scheduled Meds:   . acetaminophen  650 mg Oral Once  .  ceFAZolin (ANCEF) IV  1 g Intravenous Q6H  . Chlorhexidine Gluconate Cloth  6 each Topical Q0600  . feeding supplement  237 mL Oral TID WC  . ferrous sulfate  325 mg Oral TID WC  . folic acid  1 mg Oral Daily  . gabapentin  300 mg Oral TID  . heparin  5,000 Units Subcutaneous Q8H  . HYDROmorphone      . imipenem-cilastatin  500 mg Intravenous Q8H  . insulin aspart  0-15 Units Subcutaneous TID WC  . insulin aspart  0-5 Units Subcutaneous QHS  . insulin aspart protamine-insulin aspart  23 Units Subcutaneous BID WC  . lisinopril  10 mg Oral Daily  . multivitamin with minerals  1 tablet Oral Daily  . mupirocin ointment   Nasal BID  . protein supplement  1 scoop Oral TID WC  . thiamine  100 mg Oral Daily   Or  . thiamine  100 mg Intravenous Daily  . DISCONTD: chlorhexidine  60 mL Topical Once  . DISCONTD: doxycycline  100 mg Oral Q12H  . DISCONTD: imipenem-cilastatin  500 mg Intravenous Q6H  . DISCONTD: vancomycin  1,250 mg Intravenous Q24H   Continuous Infusions:   . sodium chloride    . sodium chloride 20 mL/hr (08/25/12 2300)     [name] Krogh, DO  [facility] Pager [phone]  If 7PM-7AM, please contact  night-coverage [email or web address] Password [facility] 08/26/2012, 3:28 PM  LOS: 6 days   

## 2012-08-27 DIAGNOSIS — L02619 Cutaneous abscess of unspecified foot: Secondary | ICD-10-CM

## 2012-08-27 LAB — CBC
HCT: 23.7 % — ABNORMAL LOW (ref 36.0–46.0)
Hemoglobin: 7.1 g/dL — ABNORMAL LOW (ref 12.0–15.0)
MCH: 22.4 pg — ABNORMAL LOW (ref 26.0–34.0)
MCHC: 30 g/dL (ref 30.0–36.0)
MCV: 74.8 fL — ABNORMAL LOW (ref 78.0–100.0)
Platelets: 476 10*3/uL — ABNORMAL HIGH (ref 150–400)
RBC: 3.17 MIL/uL — ABNORMAL LOW (ref 3.87–5.11)
RDW: 15.1 % (ref 11.5–15.5)
WBC: 13.3 10*3/uL — ABNORMAL HIGH (ref 4.0–10.5)

## 2012-08-27 LAB — CULTURE, BLOOD (ROUTINE X 2)
Culture: NO GROWTH
Culture: NO GROWTH

## 2012-08-27 LAB — GLUCOSE, CAPILLARY
Glucose-Capillary: 111 mg/dL — ABNORMAL HIGH (ref 70–99)
Glucose-Capillary: 214 mg/dL — ABNORMAL HIGH (ref 70–99)
Glucose-Capillary: 157 mg/dL — ABNORMAL HIGH (ref 70–99)

## 2012-08-27 LAB — BASIC METABOLIC PANEL
BUN: 22 mg/dL (ref 6–23)
CO2: 28 mEq/L (ref 19–32)
Calcium: 8.5 mg/dL (ref 8.4–10.5)
Chloride: 103 mEq/L (ref 96–112)
Creatinine, Ser: 1.28 mg/dL — ABNORMAL HIGH (ref 0.50–1.10)
GFR calc Af Amer: 58 mL/min — ABNORMAL LOW (ref >90)
GFR calc non Af Amer: 50 mL/min — ABNORMAL LOW (ref >90)
Glucose, Bld: 112 mg/dL — ABNORMAL HIGH (ref 70–99)
Potassium: 4.4 mEq/L (ref 3.5–5.1)
Sodium: 138 mEq/L (ref 135–145)

## 2012-08-27 LAB — PREPARE RBC (CROSSMATCH)

## 2012-08-27 MED ORDER — LACTULOSE 10 GM/15ML PO SOLN
10.0000 g | Freq: Three times a day (TID) | ORAL | Status: DC
  Administered 2012-08-27 – 2012-08-28 (×3): 10 g via ORAL
  Filled 2012-08-27 (×8): qty 15

## 2012-08-27 MED ORDER — INSULIN ASPART PROT & ASPART (70-30 MIX) 100 UNIT/ML ~~LOC~~ SUSP
30.0000 [IU] | Freq: Every day | SUBCUTANEOUS | Status: DC
  Administered 2012-08-28 – 2012-08-30 (×3): 30 [IU] via SUBCUTANEOUS
  Filled 2012-08-27: qty 3

## 2012-08-27 MED ORDER — INSULIN ASPART PROT & ASPART (70-30 MIX) 100 UNIT/ML ~~LOC~~ SUSP
23.0000 [IU] | Freq: Every day | SUBCUTANEOUS | Status: DC
  Administered 2012-08-28 – 2012-08-29 (×2): 23 [IU] via SUBCUTANEOUS

## 2012-08-27 MED ORDER — METOPROLOL TARTRATE 12.5 MG HALF TABLET
12.5000 mg | ORAL_TABLET | Freq: Two times a day (BID) | ORAL | Status: DC
  Administered 2012-08-27 – 2012-08-30 (×6): 12.5 mg via ORAL
  Filled 2012-08-27 (×7): qty 1

## 2012-08-27 MED ORDER — SENNA 8.6 MG PO TABS
1.0000 | ORAL_TABLET | Freq: Every day | ORAL | Status: DC
  Administered 2012-08-27 – 2012-08-30 (×4): 8.6 mg via ORAL
  Filled 2012-08-27 (×4): qty 1

## 2012-08-27 NOTE — Progress Notes (Signed)
Subjective: 2 Days Post-Op Procedure(s) (LRB): AMPUTATION BELOW KNEE (Right) Patient reports pain as mild.    Objective: Vital signs in last 24 hours: Temp:  [98 F (36.7 C)-98.2 F (36.8 C)] 98.2 F (36.8 C) (09/22 0620) Pulse Rate:  [90-97] 90  (09/22 0620) Resp:  [18] 18  (09/22 0620) BP: (128-156)/(71-97) 156/97 mmHg (09/22 0620) SpO2:  [100 %] 100 % (09/22 0620)  Intake/Output from previous day: 09/21 0701 - 09/22 0700 In: -  Out: 3 [Urine:2; Emesis/NG output:1] Intake/Output this shift:     Basename 08/27/12 0540 08/26/12 0530 08/25/12 1138 08/25/12 0530  HGB 7.1* 7.6* 7.7* 7.6*    Basename 08/27/12 0540 08/26/12 0530  WBC 13.3* 13.8*  RBC 3.17* 3.36*  HCT 23.7* 24.9*  PLT 476* 474*    Basename 08/27/12 0540 08/26/12 0530  NA 138 142  K 4.4 4.9  CL 103 106  CO2 28 25  BUN 22 15  CREATININE 1.28* 0.96  GLUCOSE 112* 189*  CALCIUM 8.5 8.7   No results found for this basename: LABPT:2,INR:2 in the last 72 hours  fantom symptoms, dressing dry  Assessment/Plan: 2 Days Post-Op Procedure(s) (LRB): AMPUTATION BELOW KNEE (Right) PLAN   Possible rehab  YATES,MARK C 08/27/2012, 10:41 AM

## 2012-08-27 NOTE — Progress Notes (Signed)
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  S/p BKA (9/20)  Appreciate orthopedics  Continue Vanc(9/16) and Imipenem (9/17)  Appreciate ID input--plan doxycycline at the time of discharge  Consult inpatient rehabilitation  .Acute kidney injury  Resolved with IV fluids  Continue to withhold metformin  Lisinopril restarted  .Diabetes mellitus type 2, uncontrolled Stable; continue home insulin 70/30, follow CBGs  ADA diet insulin scale insulin ordered  Pt not taking insulin x 2-3wks prior to admission  Prealbumin 10.2 --start Glucerna and beneprotein  .Alcohol dependency Continue thiamine  Anemia of chronic disease /iron deficiency  -Ferric gluconate given September 19  -Continue ferrous sulfate 3 times a day  -Transfuse 2 units packed red blood cells Hypertension -Continue lisinopril -Add low-dose beta blocker Disposition Plan: CIR if accepted     Procedures/Studies: Mr Foot Right W Wo Contrast  08/22/2012  *RADIOLOGY REPORT*  Clinical Data: Medial right mid foot wound.  Multiple previous irrigation & drainages and amputations.  Question osteomyelitis.  MRI OF THE RIGHT FOREFOOT WITHOUT CONTRAST  Technique:  Multiplanar, multisequence MR imaging was performed. No intravenous contrast was administered (no IV access).  Comparison: Radiographs 08/20/2012.  Findings: The study includes the midfoot and forefoot.  The hind foot is not imaged.  The area of the plantar foreign body adjacent to the calcaneal tuberosity demonstrated on the prior radiographs is not imaged.  There is dermal thickening and skin ulceration along the plantar aspect of the midfoot extending medially.  This abuts the plantar fascia but extends no deeper.  The signal alteration is  most pronounced on the T1-weighted images where there is loss of fat signal.  On the T2-weighted images, there is only mild signal alteration, most consistent with granulation tissue or fibrosis. There is no drainable fluid collection.  There is dorsal subcutaneous edema which is greatest laterally. There is a small amount of ill-defined fluid along the dorsal aspect of the extensor muscles in the midfoot.  There is no significant extensor tenosynovitis or drainable fluid collection. There is a small amount of flexor tendon sheath fluid within the midfoot which is commonly seen.  The underlying forefoot musculature demonstrates atrophy and nonspecific mild T2 hyperintensity, probably diabetic myopathy.  The bones of the forefoot and midfoot appear normal without evidence of osteomyelitis. The visualized calcaneus appears normal, although the tuberosity is not imaged.  IMPRESSION:  1.  Plantar midfoot skin ulceration with underlying subcutaneous inflammation/fibrosis.  No drainable fluid collection identified. 2.  Nonspecific dorsal subcutaneous edema in the forefoot. 3.  No deep soft tissue abscess, focal myositis or evidence of osteomyelitis in the midfoot or forefoot. 4.  Note a previously demonstrated soft tissue foreign body in the plantar aspect of the hind foot is not imaged.   Original Report Authenticated By: Vivia Ewing, M.D.    Dg Chest Portable 1 View  08/20/2012  *RADIOLOGY REPORT*  Clinical Data: 1-week history of nausea, vomiting, diarrhea.  PORTABLE CHEST - 1 VIEW  Comparison: 01/07/2012  Findings: Poor inspiratory effort.  No infiltrates or failure. Slight left base scarring.  Normal cardiomediastinal silhouette. No visible effusion or pneumothorax.  No change from priors.  IMPRESSION: Stable chest.   Original Report Authenticated By: Staci Righter, M.D.    Dg Foot Complete Right  08/20/2012  *RADIOLOGY REPORT*  Clinical Data: Nonhealing wound on  the bottom of the foot.  RIGHT FOOT  COMPLETE - 3+ VIEW  Comparison: Plain films 01/07/2012.  Findings: There is extensive soft tissue swelling about the foot. A radiopaque foreign body is identified in the subcutaneous tissues in the plantar aspect of the foot with an appearance most consistent with a piece of glass measuring 0.5 cm in diameter.  No bony destructive change is seen.  There is no fracture.  Plantar calcaneal spur is noted.  IMPRESSION: Extensive soft tissue swelling compatible with cellulitis. Radiopaque foreign body in the plantar soft tissues is identified as described above.  No plain film evidence of osteomyelitis.   Original Report Authenticated By: Arvid Right. Luther Parody, M.D.          Subjective: Patient is still a little constipated. She states that she wants to continue to try the lactulose. Denies abdominal pain or vomiting. Denies any fevers, chills, chest pain, shortness of breath, dysuria.  Objective: Filed Vitals:   08/26/12 1400 08/26/12 2050 08/27/12 0620 08/27/12 1343  BP: 146/71 147/90 156/97 152/81  Pulse: 95 97 90 90  Temp: 98 F (36.7 C) 98.2 F (36.8 C) 98.2 F (36.8 C) 98.3 F (36.8 C)  TempSrc: Oral Oral Oral Oral  Resp: 18 18 18 18   Height:      Weight:      SpO2: 100% 100% 100% 100%    Intake/Output Summary (Last 24 hours) at 08/27/12 1937 Last data filed at 08/27/12 0618  Gross per 24 hour  Intake      0 ml  Output      3 ml  Net     -3 ml   Weight change:  Exam:   General:  Pt is alert, follows commands appropriately, not in acute distress  HEENT: No icterus, No thrush,[place]/AT  Cardiovascular: RRR, S1/S2, no rubs, no gallops  Respiratory: Clear to auscultation bilaterally, no wheezing, no crackles, no rhonchi  Abdomen: Soft/+BS, non tender, non distended, no guarding  Extremities:  No lymphangitis, No petechiae, No rashes, no synovitis; right stump is wrapped without any crepitance, or bleeding thru dressings  Data Reviewed: Basic Metabolic Panel:  Lab [id number]  0540 08/26/12 0530 08/25/12 1138 08/25/12 0530 08/24/12 0450  NA 138 142 140 135 136  K 4.4 4.9 4.2 4.0 4.2  CL 103 106 107 102 106  CO2 28 25 24 23 23   GLUCOSE 112* 189* 152* 165* 190*  BUN 22 15 14 16 20   CREATININE 1.28* 0.96 0.83 0.90 0.89  CALCIUM 8.5 8.7 8.2* 8.5 8.2*  MG -- -- -- -- --  PHOS -- -- -- -- --   Liver Function Tests: No results found for this basename: AST:5,ALT:5,ALKPHOS:5,BILITOT:5,PROT:5,ALBUMIN:5 in the last 168 hours No results found for this basename: LIPASE:5,AMYLASE:5 in the last 168 hours No results found for this basename: AMMONIA:5 in the last 168 hours CBC:  Lab 08/27/12 0540 08/26/12 0530 08/25/12 1138 08/25/12 0530 08/24/12 0450  WBC 13.3* 13.8* 14.2* 15.7* 17.6*  NEUTROABS -- -- -- -- --  HGB 7.1* 7.6* 7.7* 7.6* 7.3*  HCT 23.7* 24.9* 25.1* 24.8* 23.7*  MCV 74.8* 74.1* 73.2* 73.4* 72.9*  PLT 476* 474* 493* 464* 461*   Cardiac Enzymes: No results found for this basename: CKTOTAL:5,CKMB:5,CKMBINDEX:5,TROPONINI:5 in the last 168 hours BNP: No components found with this basename: POCBNP:5 CBG:  Lab 08/27/12 1610 08/27/12 1122 08/27/12 0711 08/26/12 2222 08/26/12 1608  GLUCAP 157* 214* 111* 106* 220*    Recent Results (from the past 240 hour(s))  CULTURE,  BLOOD (ROUTINE X 2)     Status: Normal   Collection Time   08/20/12 10:45 PM      Component Value Range Status Comment   Specimen Description BLOOD LEFT ARM   Final    Special Requests     Final    Value: BOTTLES DRAWN AEROBIC AND ANAEROBIC 6CC BLUE 5CC RED   Culture  Setup Time 08/21/2012 14:17   Final    Culture NO GROWTH 5 DAYS   Final    Report Status 08/27/2012 FINAL   Final   CULTURE, BLOOD (ROUTINE X 2)     Status: Normal   Collection Time   08/20/12 11:15 PM      Component Value Range Status Comment   Specimen Description BLOOD RIGHT HAND   Final    Special Requests BOTTLES DRAWN AEROBIC AND ANAEROBIC 2CC EA   Final    Culture  Setup Time 08/21/2012 14:16   Final    Culture NO  GROWTH 5 DAYS   Final    Report Status 08/27/2012 FINAL   Final   CLOSTRIDIUM DIFFICILE BY PCR     Status: Normal   Collection Time   08/21/12  2:56 PM      Component Value Range Status Comment   C difficile by pcr NEGATIVE  NEGATIVE Final   SURGICAL PCR SCREEN     Status: Abnormal   Collection Time   08/25/12 12:24 AM      Component Value Range Status Comment   MRSA, PCR NEGATIVE  NEGATIVE Final    Staphylococcus aureus POSITIVE (*) NEGATIVE Final      Scheduled Meds:   . acetaminophen  650 mg Oral Once  . Chlorhexidine Gluconate Cloth  6 each Topical Q0600  . feeding supplement  237 mL Oral TID WC  . ferrous sulfate  325 mg Oral TID WC  . folic acid  1 mg Oral Daily  . gabapentin  300 mg Oral TID  . heparin  5,000 Units Subcutaneous Q8H  . imipenem-cilastatin  500 mg Intravenous Q6H  . insulin aspart  0-15 Units Subcutaneous TID WC  . insulin aspart  0-5 Units Subcutaneous QHS  . insulin aspart protamine-insulin aspart  23 Units Subcutaneous Q supper  . insulin aspart protamine-insulin aspart  30 Units Subcutaneous Q breakfast  . lactulose  10 g Oral TID  . lisinopril  10 mg Oral Daily  . multivitamin with minerals  1 tablet Oral Daily  . mupirocin ointment   Nasal BID  . protein supplement  1 scoop Oral TID WC  . senna  1 tablet Oral Daily  . thiamine  100 mg Oral Daily   Or  . thiamine  100 mg Intravenous Daily  . vancomycin  1,250 mg Intravenous Q24H  . DISCONTD: insulin aspart protamine-insulin aspart  23 Units Subcutaneous BID WC   Continuous Infusions:   . sodium chloride 20 mL/hr (08/25/12 2300)  . DISCONTD: sodium chloride       Aisling Emigh, DO  [facility] Pager [phone]  If 7PM-7AM, please contact night-coverage [email or web address] Password [facility] 08/27/2012, 7:37 PM   LOS: 7 days

## 2012-08-27 NOTE — Progress Notes (Signed)
ANTIBIOTIC CONSULT NOTE - FOLLOW UP  Pharmacy Consult for vancomycin Indication: right diabetic foot ulcer  No Known Allergies  Patient Measurements: Height: 5\' 6"  (167.6 cm) (66inches) Weight: 180 lb 1.6 oz (81.693 kg) IBW/kg (Calculated) : 59.3    Vital Signs: Temp: 98.3 F (36.8 C) (09/22 1343) Temp src: Oral (09/22 1343) BP: 152/81 mmHg (09/22 1343) Pulse Rate: 90  (09/22 1343) Intake/Output from previous day: 09/21 0701 - 09/22 0700 In: -  Out: 3 [Urine:2; Emesis/NG output:1] Intake/Output from this shift:    Labs:  Basename 08/27/12 0540 08/26/12 0530 08/25/12 1138  WBC 13.3* 13.8* 14.2*  HGB 7.1* 7.6* 7.7*  PLT 476* 474* 493*  LABCREA -- -- --  CREATININE 1.28* 0.96 0.83   Estimated Creatinine Clearance: 60.5 ml/min (by C-G formula based on Cr of 1.28). No results found for this basename: VANCOTROUGH:2,VANCOPEAK:2,VANCORANDOM:2,GENTTROUGH:2,GENTPEAK:2,GENTRANDOM:2,TOBRATROUGH:2,TOBRAPEAK:2,TOBRARND:2,AMIKACINPEAK:2,AMIKACINTROU:2,AMIKACIN:2, in the last 72 hours   Microbiology: Recent Results (from the past 720 hour(s))  CULTURE, BLOOD (ROUTINE X 2)     Status: Normal   Collection Time   08/20/12 10:45 PM      Component Value Range Status Comment   Specimen Description BLOOD LEFT ARM   Final    Special Requests     Final    Value: BOTTLES DRAWN AEROBIC AND ANAEROBIC 6CC BLUE 5CC RED   Culture  Setup Time 08/21/2012 14:17   Final    Culture NO GROWTH 5 DAYS   Final    Report Status 08/27/2012 FINAL   Final   CULTURE, BLOOD (ROUTINE X 2)     Status: Normal   Collection Time   08/20/12 11:15 PM      Component Value Range Status Comment   Specimen Description BLOOD RIGHT HAND   Final    Special Requests BOTTLES DRAWN AEROBIC AND ANAEROBIC 2CC EA   Final    Culture  Setup Time 08/21/2012 14:16   Final    Culture NO GROWTH 5 DAYS   Final    Report Status 08/27/2012 FINAL   Final   CLOSTRIDIUM DIFFICILE BY PCR     Status: Normal   Collection Time   08/21/12   2:56 PM      Component Value Range Status Comment   C difficile by pcr NEGATIVE  NEGATIVE Final   SURGICAL PCR SCREEN     Status: Abnormal   Collection Time   08/25/12 12:24 AM      Component Value Range Status Comment   MRSA, PCR NEGATIVE  NEGATIVE Final    Staphylococcus aureus POSITIVE (*) NEGATIVE Final     Anti-infectives     Start     Dose/Rate Route Frequency Ordered Stop   08/27/12 0500   vancomycin (VANCOCIN) 1,250 mg in sodium chloride 0.9 % 250 mL IVPB        1,250 mg 166.7 mL/hr over 90 Minutes Intravenous Every 24 hours 08/26/12 1533     08/26/12 1630   doxycycline (VIBRA-TABS) tablet 100 mg  Status:  Discontinued        100 mg Oral Every 12 hours 08/26/12 1525 08/26/12 1527   08/26/12 1600   imipenem-cilastatin (PRIMAXIN) 500 mg in sodium chloride 0.9 % 100 mL IVPB        500 mg 200 mL/hr over 30 Minutes Intravenous Every 6 hours 08/26/12 1527     08/26/12 0500   vancomycin (VANCOCIN) 1,250 mg in sodium chloride 0.9 % 250 mL IVPB  Status:  Discontinued        1,250  mg 166.7 mL/hr over 90 Minutes Intravenous Every 24 hours 08/25/12 1254 08/26/12 1525   08/25/12 2230   ceFAZolin (ANCEF) IVPB 1 g/50 mL premix        1 g 100 mL/hr over 30 Minutes Intravenous Every 6 hours 08/25/12 2205 08/26/12 1249   08/25/12 1300   imipenem-cilastatin (PRIMAXIN) 500 mg in sodium chloride 0.9 % 100 mL IVPB  Status:  Discontinued        500 mg 200 mL/hr over 30 Minutes Intravenous 4 times per day 08/25/12 1254 08/26/12 1525   08/22/12 1415   imipenem-cilastatin (PRIMAXIN) 500 mg in sodium chloride 0.9 % 100 mL IVPB  Status:  Discontinued        500 mg 200 mL/hr over 30 Minutes Intravenous 3 times per day 08/22/12 1404 08/25/12 1254   08/22/12 0500   vancomycin (VANCOCIN) IVPB 1000 mg/200 mL premix  Status:  Discontinued        1,000 mg 200 mL/hr over 60 Minutes Intravenous Every 24 hours 08/21/12 1045 08/25/12 1254   08/21/12 0400   cefTRIAXone (ROCEPHIN) 1 g in dextrose 5 % 50 mL  IVPB  Status:  Discontinued        1 g 100 mL/hr over 30 Minutes Intravenous Every 24 hours 08/21/12 0301 08/22/12 1404   08/21/12 0400   vancomycin (VANCOCIN) IVPB 1000 mg/200 mL premix  Status:  Discontinued        1,000 mg 200 mL/hr over 60 Minutes Intravenous Every 12 hours 08/21/12 0340 08/21/12 1045          Assessment: 46 y.o female on vancomycin and primaxin  per Rx for diabetic right foot ulcer, s/p TBA. MRI negative for osteo. WBC trend down , afeb.  SCr trend up to 1.28 today after being stable crcl 60.13ml/min.  Goal of Therapy:  Vancomycin trough level 10-15 mcg/ml  Plan:  1.Continue vancomycin 1250 mg IV q24h 2.Follow up renal function  3.Consider drawing random vanc trough  Bola A. Mamou, [place] Pharmacist Pager:[phone] Phone [phone] 08/27/2012 2:11 PM

## 2012-08-28 DIAGNOSIS — I739 Peripheral vascular disease, unspecified: Secondary | ICD-10-CM

## 2012-08-28 DIAGNOSIS — I10 Essential (primary) hypertension: Secondary | ICD-10-CM

## 2012-08-28 DIAGNOSIS — L98499 Non-pressure chronic ulcer of skin of other sites with unspecified severity: Secondary | ICD-10-CM

## 2012-08-28 DIAGNOSIS — S88119A Complete traumatic amputation at level between knee and ankle, unspecified lower leg, initial encounter: Secondary | ICD-10-CM

## 2012-08-28 LAB — BASIC METABOLIC PANEL
BUN: 19 mg/dL (ref 6–23)
CO2: 29 mEq/L (ref 19–32)
Calcium: 8.9 mg/dL (ref 8.4–10.5)
Chloride: 103 mEq/L (ref 96–112)
Creatinine, Ser: 0.83 mg/dL (ref 0.50–1.10)
GFR calc Af Amer: >90 mL/min (ref >90)
GFR calc non Af Amer: 85 mL/min — ABNORMAL LOW (ref >90)
Glucose, Bld: 128 mg/dL — ABNORMAL HIGH (ref 70–99)
Potassium: 4.5 mEq/L (ref 3.5–5.1)
Sodium: 139 mEq/L (ref 135–145)

## 2012-08-28 LAB — CBC
HCT: 32.7 % — ABNORMAL LOW (ref 36.0–46.0)
Hemoglobin: 10.5 g/dL — ABNORMAL LOW (ref 12.0–15.0)
MCH: 24.7 pg — ABNORMAL LOW (ref 26.0–34.0)
MCHC: 32.1 g/dL (ref 30.0–36.0)
MCV: 76.9 fL — ABNORMAL LOW (ref 78.0–100.0)
Platelets: 516 10*3/uL — ABNORMAL HIGH (ref 150–400)
RBC: 4.25 MIL/uL (ref 3.87–5.11)
RDW: 16.1 % — ABNORMAL HIGH (ref 11.5–15.5)
WBC: 14.3 10*3/uL — ABNORMAL HIGH (ref 4.0–10.5)

## 2012-08-28 NOTE — Consult Note (Signed)
Met with pt to discuss CIR. Pt would benefit from CIR and pt states she feels ready for inpatient rehab. Await CIR bed availability. Informed pt's SW, Butch Penny of above. Will f/u in AM. Please call with questions: [phone].

## 2012-08-28 NOTE — Progress Notes (Signed)
Patient ID: [name], female   DOB: 1965-12-09, 46 y.o.   MRN: [id number] Postoperative day 3 right transtibial amputation. Patient is comfortable the dressing is clean and dry. Patient is okay for discharge to either inpatient rehabilitation or short-term skilled nursing. I will followup with her 2 weeks in the office.

## 2012-08-28 NOTE — Progress Notes (Signed)
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  S/p BKA (9/20)  Appreciate orthopedics  Continue Vanc(9/16) and Imipenem (9/17)  Appreciate ID input--plan doxycycline at the time of discharge  PMR consult appreciated .Acute kidney injury  Resolved with IV fluids  Continue to withhold metformin  Lisinopril restarted  .Diabetes mellitus type 2, uncontrolled Stable; continue home insulin 70/30, follow CBGs  ADA diet insulin scale insulin ordered  Pt not taking insulin x 2-3wks prior to admission  Prealbumin 10.2 --started Glucerna and beneprotein  .Alcohol dependency Continue thiamine  Anemia of chronic disease /iron deficiency  -Ferric gluconate given September 19  -Continue ferrous sulfate 3 times a day  -Transfused 2 units packed red blood cells (9/22) Hypertension  -Continue lisinopril  -Added low-dose beta blocker--BP improving  Disposition Plan: CIR      Disposition Plan:   Home when medically stable      Procedures/Studies: Mr Foot Right W Wo Contrast  08/28/2012  **ADDENDUM** CREATED: 08/24/2012 10:28:39  Dr. Sharol Given requested that the patient be returned for additional imaging of the entire foot to include postcontrast imaging.  That was performed on 08/23/2012 and includes images following the IV infusion of 17 ml Multihance.  The entire foot is included on these images.  There are small foci of susceptibility artifact superficially in the lateral subcutaneous fat adjacent to the calcaneus, likely corresponding with the foreign body seen on the radiographs.  Surrounding these areas of artifact is a complex peripherally enhancing fluid collection wrapping along the lateral and dorsal aspect of the calcaneal tuberosity.  This measures  approximately 4.6 cm anterior- posterior and 3.9 cm transverse.  The fluid extends to the cortex of the calcaneal tuberosity laterally near the insertion of the plantar fascia.  No cortical destruction is evident.  However, there is mild adjacent marrow edema and enhancement.  There is also enhancement within the plantar fascia.  No other focal fluid collections are identified.  The generalized soft tissue edema extending into the dorsum of the foot has progressed over this 2-day interval.  The subtalar and tibiotalar joints appear unremarkable.  In summary, follow-up imaging on 08/23/2012 demonstrates an ill- defined soft tissue abscess wrapping around the lateral and inferior aspect of the calcaneal tuberosity associated with apparent superficial small foreign bodies. The abscess demonstrates deep extension and abuts the lateral cortex of the tuberosity. Marrow edema and enhancement within the lateral aspect of the calcaneal tuberosity are nonspecific and may be secondary to hyperemia.  Early marrow infection cannot be excluded.  Findings were discussed by telephone with Dr. Sharol Given on 08/24/2012.  **END ADDENDUM** SIGNED BY: Vivia Ewing, M.D.   08/28/2012  **ADDENDUM** CREATED: 08/28/2012 09:46:10  The corrected title for this examination based on the patient having returned for postcontrast imaging is MRI OF THE RIGHT FOOT WITHOUT AND WITH CONTRAST.  **END ADDENDUM** SIGNED BY: Vivia Ewing, M.D.   08/22/2012  *RADIOLOGY REPORT*  Clinical Data: Medial right mid foot wound.  Multiple previous irrigation & drainages and amputations.  Question osteomyelitis.  MRI OF THE RIGHT FOREFOOT WITHOUT CONTRAST  Technique:  Multiplanar, multisequence MR imaging was performed. No intravenous contrast was administered (no IV access).  Comparison: Radiographs 08/20/2012.  Findings: The study includes the midfoot and forefoot.  The hind foot is not imaged.  The area of the plantar foreign  body adjacent to the calcaneal  tuberosity demonstrated on the prior radiographs is not imaged.  There is dermal thickening and skin ulceration along the plantar aspect of the midfoot extending medially.  This abuts the plantar fascia but extends no deeper.  The signal alteration is most pronounced on the T1-weighted images where there is loss of fat signal.  On the T2-weighted images, there is only mild signal alteration, most consistent with granulation tissue or fibrosis. There is no drainable fluid collection.  There is dorsal subcutaneous edema which is greatest laterally. There is a small amount of ill-defined fluid along the dorsal aspect of the extensor muscles in the midfoot.  There is no significant extensor tenosynovitis or drainable fluid collection. There is a small amount of flexor tendon sheath fluid within the midfoot which is commonly seen.  The underlying forefoot musculature demonstrates atrophy and nonspecific mild T2 hyperintensity, probably diabetic myopathy.  The bones of the forefoot and midfoot appear normal without evidence of osteomyelitis. The visualized calcaneus appears normal, although the tuberosity is not imaged.  IMPRESSION:  1.  Plantar midfoot skin ulceration with underlying subcutaneous inflammation/fibrosis.  No drainable fluid collection identified. 2.  Nonspecific dorsal subcutaneous edema in the forefoot. 3.  No deep soft tissue abscess, focal myositis or evidence of osteomyelitis in the midfoot or forefoot. 4.  Note a previously demonstrated soft tissue foreign body in the plantar aspect of the hind foot is not imaged.   Original Report Authenticated By: Vivia Ewing, M.D.    Dg Chest Portable 1 View  08/20/2012  *RADIOLOGY REPORT*  Clinical Data: 1-week history of nausea, vomiting, diarrhea.  PORTABLE CHEST - 1 VIEW  Comparison: 01/07/2012  Findings: Poor inspiratory effort.  No infiltrates or failure. Slight left base scarring.  Normal cardiomediastinal silhouette. No visible effusion or  pneumothorax.  No change from priors.  IMPRESSION: Stable chest.   Original Report Authenticated By: Staci Righter, M.D.    Dg Foot Complete Right  08/20/2012  *RADIOLOGY REPORT*  Clinical Data: Nonhealing wound on the bottom of the foot.  RIGHT FOOT COMPLETE - 3+ VIEW  Comparison: Plain films 01/07/2012.  Findings: There is extensive soft tissue swelling about the foot. A radiopaque foreign body is identified in the subcutaneous tissues in the plantar aspect of the foot with an appearance most consistent with a piece of glass measuring 0.5 cm in diameter.  No bony destructive change is seen.  There is no fracture.  Plantar calcaneal spur is noted.  IMPRESSION: Extensive soft tissue swelling compatible with cellulitis. Radiopaque foreign body in the plantar soft tissues is identified as described above.  No plain film evidence of osteomyelitis.   Original Report Authenticated By: Arvid Right. Luther Parody, M.D.          Subjective: Patient doing well. She states that she had a bowel movement with the lactulose. Denies any chest pain, shortness of breath, nausea, vomiting, abdominal pain, dizziness. The pain is controlled the  Objective: Filed Vitals:   08/28/12 0439 08/28/12 0525 08/28/12 0933 08/28/12 1400  BP: 124/73 124/72 132/85 155/87  Pulse: 87 88 96 86  Temp: 97.5 F (36.4 C) 97.5 F (36.4 C)  98.4 F (36.9 C)  TempSrc: Oral Oral    Resp: 18 18  16   Height:      Weight:      SpO2: 99% 100%  100%    Intake/Output Summary (Last 24 hours) at 08/28/12 1954 Last data filed at 08/28/12 1200  Gross per 24  hour  Intake   2691 ml  Output      3 ml  Net   2688 ml   Weight change:  Exam:   General:  Pt is alert, follows commands appropriately, not in acute distress  HEENT: No icterus, No thrush, No neck mass, [place]/AT  Cardiovascular: RRR, S1/S2, no rubs, no gallops  Respiratory: Clear to auscultation bilaterally, no wheezing, no crackles, no rhonchi  Abdomen: Soft/+BS, non  tender, non distended, no guarding  Extremities: Right foot is wrapped without bleeding --No lymphangitis, No petechiae, No rashes, no synovitis  Data Reviewed: Basic Metabolic Panel:  Lab [id number] 08/27/12 0540 08/26/12 0530 08/25/12 1138 08/25/12 0530  NA 139 138 142 140 135  K 4.5 4.4 4.9 4.2 4.0  CL 103 103 106 107 102  CO2 29 28 25 24 23   GLUCOSE 128* 112* 189* 152* 165*  BUN 19 22 15 14 16   CREATININE 0.83 1.28* 0.96 0.83 0.90  CALCIUM 8.9 8.5 8.7 8.2* 8.5  MG -- -- -- -- --  PHOS -- -- -- -- --   Liver Function Tests: No results found for this basename: AST:5,ALT:5,ALKPHOS:5,BILITOT:5,PROT:5,ALBUMIN:5 in the last 168 hours No results found for this basename: LIPASE:5,AMYLASE:5 in the last 168 hours No results found for this basename: AMMONIA:5 in the last 168 hours CBC:  Lab 08/28/12 0750 08/27/12 0540 08/26/12 0530 08/25/12 1138 08/25/12 0530  WBC 14.3* 13.3* 13.8* 14.2* 15.7*  NEUTROABS -- -- -- -- --  HGB 10.5* 7.1* 7.6* 7.7* 7.6*  HCT 32.7* 23.7* 24.9* 25.1* 24.8*  MCV 76.9* 74.8* 74.1* 73.2* 73.4*  PLT 516* 476* 474* 493* 464*   Cardiac Enzymes: No results found for this basename: CKTOTAL:5,CKMB:5,CKMBINDEX:5,TROPONINI:5 in the last 168 hours BNP: No components found with this basename: POCBNP:5 CBG:  Lab 08/27/12 1610 08/27/12 1122 08/27/12 0711 08/26/12 2222 08/26/12 1608  GLUCAP 157* 214* 111* 106* 220*    Recent Results (from the past 240 hour(s))  CULTURE, BLOOD (ROUTINE X 2)     Status: Normal   Collection Time   08/20/12 10:45 PM      Component Value Range Status Comment   Specimen Description BLOOD LEFT ARM   Final    Special Requests     Final    Value: BOTTLES DRAWN AEROBIC AND ANAEROBIC 6CC BLUE 5CC RED   Culture  Setup Time 08/21/2012 14:17   Final    Culture NO GROWTH 5 DAYS   Final    Report Status 08/27/2012 FINAL   Final   CULTURE, BLOOD (ROUTINE X 2)     Status: Normal   Collection Time   08/20/12 11:15 PM      Component Value  Range Status Comment   Specimen Description BLOOD RIGHT HAND   Final    Special Requests BOTTLES DRAWN AEROBIC AND ANAEROBIC 2CC EA   Final    Culture  Setup Time 08/21/2012 14:16   Final    Culture NO GROWTH 5 DAYS   Final    Report Status 08/27/2012 FINAL   Final   CLOSTRIDIUM DIFFICILE BY PCR     Status: Normal   Collection Time   08/21/12  2:56 PM      Component Value Range Status Comment   C difficile by pcr NEGATIVE  NEGATIVE Final   SURGICAL PCR SCREEN     Status: Abnormal   Collection Time   08/25/12 12:24 AM      Component Value Range Status Comment   MRSA, PCR NEGATIVE  NEGATIVE Final    Staphylococcus aureus POSITIVE (*) NEGATIVE Final      Scheduled Meds:   . acetaminophen  650 mg Oral Once  . Chlorhexidine Gluconate Cloth  6 each Topical Q0600  . feeding supplement  237 mL Oral TID WC  . ferrous sulfate  325 mg Oral TID WC  . folic acid  1 mg Oral Daily  . gabapentin  300 mg Oral TID  . heparin  5,000 Units Subcutaneous Q8H  . imipenem-cilastatin  500 mg Intravenous Q6H  . insulin aspart  0-15 Units Subcutaneous TID WC  . insulin aspart  0-5 Units Subcutaneous QHS  . insulin aspart protamine-insulin aspart  23 Units Subcutaneous Q supper  . insulin aspart protamine-insulin aspart  30 Units Subcutaneous Q breakfast  . lactulose  10 g Oral TID  . lisinopril  10 mg Oral Daily  . metoprolol tartrate  12.5 mg Oral BID  . multivitamin with minerals  1 tablet Oral Daily  . mupirocin ointment   Nasal BID  . protein supplement  1 scoop Oral TID WC  . senna  1 tablet Oral Daily  . thiamine  100 mg Oral Daily   Or  . thiamine  100 mg Intravenous Daily  . vancomycin  1,250 mg Intravenous Q24H   Continuous Infusions:   . sodium chloride 20 mL/hr (08/25/12 2300)     Pieper Kasik, DO  [facility] Pager [phone]  If 7PM-7AM, please contact night-coverage [email or web address] Password [facility] 08/28/2012, 7:54 PM   LOS: 8 days

## 2012-08-28 NOTE — Progress Notes (Signed)
Physical Therapy Progress Note   08/28/12 1100  PT Visit Information  Last PT Received On 08/28/12  Assistance Needed +1  PT/OT Co-Evaluation/Treatment Yes  PT Time Calculation  PT Start Time 1019  PT Stop Time 1042  PT Time Calculation (min) 23 min  Subjective Data  Subjective I am feeling okay   Patient Stated Goal To eventually go home  Precautions  Precautions Fall  Restrictions  Weight Bearing Restrictions Yes  RLE Weight Bearing NWB  Cognition  Overall Cognitive Status Appears within functional limits for tasks assessed/performed  Arousal/Alertness Awake/alert  Orientation Level Appears intact for tasks assessed  Behavior During Session [facility] for tasks performed  Bed Mobility  Bed Mobility Not assessed  Transfers  Transfers Sit to Stand;Stand to Sit  Sit to Stand 4: Min assist;From bed;From toilet;With upper extremity assist  Stand to Sit 4: Min assist;Without upper extremity assist;To chair/3-in-1;To toilet  Details for Transfer Assistance (A) with balance and safety during transfers.  Cues for hand placement and RW placement  Ambulation/Gait  Ambulation/Gait Assistance 4: Min assist  Ambulation Distance (Feet) 25 Feet  Assistive device Rolling walker  Ambulation/Gait Assistance Details (A) with balance and RW placement.  Cues for hip placement within the walker, RW placement, proper technique, and safety  Gait Pattern Step-to pattern  Gait velocity decreased  Stairs No  Wheelchair Mobility  Wheelchair Mobility No  Exercises  Exercises Amputee  Amputee Exercises  Quad Sets AROM;Right;5 reps  Knee Flexion AROM;Right;5 reps  Straight Leg Raises AROM;Right;5 reps  Gluteal Sets AROM;Right;5 reps  Knee Extension Other (comment) (SAQ AROM 5 reps )  PT - End of Session  Equipment Utilized During Treatment Gait belt  Activity Tolerance Patient tolerated treatment well  Patient left in chair;with call bell/phone within reach  Nurse Communication Mobility status  PT  - Assessment/Plan  Comments on Treatment Session Pt became fatiqued after ~ 4 hops.  Frequent standing rest breaks were taken.  Pt educated on maintain safety during transfers before performing the transfer.  Pt would benefit from inpatient rehab to achieve modified independence prior to d/c home.  Pt has decreased caregiver support and will benefit from extended rehab.  PT Plan Discharge plan remains appropriate;Frequency remains appropriate  PT Frequency Min 5X/week  Recommendations for Other Services Rehab consult  Follow Up Recommendations Inpatient Rehab  Equipment Recommended Rolling walker with 5" wheels;3 in 1 bedside comode  Acute Rehab PT Goals  PT Goal Formulation With patient  Time For Goal Achievement 09/02/12  Potential to Achieve Goals Good  Pt will go Sit to Supine/Side with modified independence  PT Goal: Sit to Supine/Side - Progress Progressing toward goal  Pt will go Sit to Stand with supervision  PT Goal: Sit to Stand - Progress Progressing toward goal  Pt will go Stand to Sit with supervision  PT Goal: Stand to Sit - Progress Progressing toward goal  Pt will Ambulate 16 - 50 feet;with supervision;with rolling walker  PT Goal: Ambulate - Progress Partly met  Pt will Perform Home Exercise Program Independently  PT Goal: Perform Home Exercise Program - Progress Progressing toward goal    At beginning of treatment pt report pain in R LE at 5/10.  At the end of therapy pt reported having no pain.  Amy DiTommaso, SPT [place], [place] DPT [phone]

## 2012-08-28 NOTE — Progress Notes (Addendum)
Clinical Social Work Department CLINICAL SOCIAL WORK PLACEMENT NOTE 08/28/2012  Patient:  [name],[name]  Account Number:  [id number] [place] date:  08/20/2012  Clinical Social Worker:  Ulyess Blossom  Date/time:  08/28/2012 04:30 PM  Clinical Social Work is seeking post-discharge placement for this patient at the following level of care:   SKILLED NURSING   (*CSW will update this form in Epic as items are completed)   08/28/2012  Patient/family provided with [place] Department of Clinical Social Work's list of facilities offering this level of care within the geographic area requested by the patient (or if unable, by the patient's family).  08/28/2012  Patient/family informed of their freedom to choose among providers that offer the needed level of care, that participate in Medicare, Medicaid or managed care program needed by the patient, have an available bed and are willing to accept the patient.  08/28/2012  Patient/family informed of [facility]' ownership interest in [facility], as well as of the fact that they are under no obligation to receive care at this facility.  PASARR submitted to EDS on 08/28/2012 PASARR number received from EDS on 08/28/2012  FL2 transmitted to all facilities in geographic area requested by pt/family on  08/29/2012 FL2 transmitted to all facilities within larger geographic area on   Patient informed that his/her managed care company has contracts with or will negotiate with  certain facilities, including the following:     Patient/family informed of bed offers received:  08/29/12 Patient chooses bed at [facility] Physician recommends and patient chooses bed at  [facility]  Patient to be transferred to  [place] on  08/30/12 Patient to be transferred to facility by ambulance  [facility])  The following physician request were entered in Epic:   Additional Comments: DC to SNF today. No CIR bed available. Patient was disappointed but  understanding and agrees to short term SNF.  Notified SNF and pt's nurse Teena of d/c.  Lorie Phenix. [place], [place]

## 2012-08-28 NOTE — Clinical Social Work Psychosocial (Addendum)
    Clinical Social Work Department BRIEF PSYCHOSOCIAL ASSESSMENT 08/28/2012  Patient:  [name]     Account Number:  [id number]     Admit date:  08/20/2012  Clinical Social Worker:  Ulyess Blossom  Date/Time:  08/28/2012 03:57 PM  Referred by:  CSW  Date Referred:  08/28/2012 Referred for  SNF Placement   Other Referral:   CIR vs SNF   Interview type:  Patient Other interview type:    PSYCHOSOCIAL DATA Living Status:  ALONE Admitted from facility:   Level of care:   Primary support name:  Jeneen Rinks Matsuo/father/[phone] Primary support relationship to patient:  PARENT Degree of support available:   unknown at this time; no family present during assessment    CURRENT CONCERNS Current Concerns  Post-Acute Placement   Other Concerns:    SOCIAL WORK ASSESSMENT / PLAN CSW received referral for SNF placement as a secondary option to CIR. CSW reviewed chart and noted that pt has been accepted clinically by CIR, but no beds available at this time. CSW met with pt at bedside, introduced self and explained role. CSW discussed with pt that this CSW recognizes that CIR is pt first choice for rehab, but due to bed availability at this time a secondary option for discharge plan would be [facility] SNF placement. CSW explained to pt about [facility] SNF and pt agreeable to initiation of SNF search in [facility]. CSW inquired with pt about current insurance status and pt reports that she has not yet met with financial counseling in regard to Medicaid/Disability. CSW discussed with pt that bed options for SNF may be limited due to no payor source at this time and pt expressed understanding and remains optimistic that bed will become available at inpatient rehab. CSW completed FL2 and initiated SNF search to [facility].   CSW contacted financial counselor who stated that they would contact pt to schedule time to complete Medicaid applications.  CSW to continue to follow bed availability  at [facility] and follow up with pt in regard to SNF bed offers. CSW to continue to follow and assist with discharge planning needs.   Assessment/plan status:  Psychosocial Support/Ongoing Assessment of Needs Other assessment/ plan:   discharge planning   Information/referral to community resources:   [facility] list    PATIENT'S/FAMILY'S RESPONSE TO PLAN OF CARE: Pt alert and oriented x 4 and pleasant. Pt is hopeful for inpatient rehab, but recognizes need for secondary option for discharge plan. Pt eager to complete Medicaid/Disability applications. Pt appreciative of CSW support and assistance.

## 2012-08-28 NOTE — Progress Notes (Signed)
Occupational Therapy Treatment Patient Details Name: [name] MRN: [id number] DOB: Jul 15, 1966 Today's Date: 08/28/2012 Time: [id number] OT Time Calculation (min): 23 min  OT Assessment / Plan / Recommendation Comments on Treatment Session Pt progressing well and could reach Independent level with CIR admission. Pt is an excellent CIR candidate    Follow Up Recommendations  Inpatient Rehab    Barriers to Discharge       Equipment Recommendations  Rolling walker with 5" wheels;3 in 1 bedside comode    Recommendations for Other Services Rehab consult  Frequency Min 2X/week   Plan Discharge plan remains appropriate    Precautions / Restrictions Precautions Precautions: Fall Restrictions RLE Weight Bearing: Non weight bearing   Pertinent Vitals/Pain "no pain"- pt reports    ADL  Grooming: Performed;Wash/dry hands;Minimal assistance (mod v/c for safety with RW) Where Assessed - Grooming: Supported standing Toilet Transfer: Performed;Minimal assistance (mod v/c for safety with RW) Toilet Transfer Method: Sit to stand Toilet Transfer Equipment: Raised toilet seat with arms (or 3-in-1 over toilet) Toileting - Clothing Manipulation and Hygiene: Performed;Moderate assistance Where Assessed - Toileting Clothing Manipulation and Hygiene: Sit to stand from 3-in-1 or toilet Equipment Used: Gait belt;Rolling walker Transfers/Ambulation Related to ADLs: Pt ambulated to bathroom ~10 ft away with x2 restbreaks due to LB fatigue. Pt required mod v/c for safety with RW. Pt educated on keeping RW close for UB (A) with transfers. ADL Comments: Pt progressing well and remains excellent rehab candidate. Pt educated on Rt LE touch for decr sensitization to help with phatom limb pain management. Pt is able to touch entire Rt LE. Pts dressing looked tight and proper fit. Pt educated on the need to keep a tight fit and have RN redress LE should it become loose. Pt educated on chair push ups to help  keep work on UB strengthening.     OT Diagnosis:    OT Problem List:   OT Treatment Interventions:     OT Goals Acute Rehab OT Goals OT Goal Formulation: With patient Time For Goal Achievement: 09/02/12 Potential to Achieve Goals: Good ADL Goals Pt Will Perform Grooming: with modified independence;Standing at sink ADL Goal: Grooming - Progress: Progressing toward goals Pt Will Perform Lower Body Dressing: with modified independence;Sit to stand from chair;Sit to stand from bed;with adaptive equipment ADL Goal: Lower Body Dressing - Progress: Progressing toward goals Pt Will Transfer to Toilet: with modified independence;Ambulation;with DME;Comfort height toilet;Maintaining weight bearing status ADL Goal: Toilet Transfer - Progress: Progressing toward goals Pt Will Perform Toileting - Clothing Manipulation: with modified independence;Standing;Sitting on 3-in-1 or toilet ADL Goal: Toileting - Clothing Manipulation - Progress: Progressing toward goals Pt Will Perform Toileting - Hygiene: with modified independence;Standing at 3-in-1/toilet;Sit to stand from 3-in-1/toilet ADL Goal: Toileting - Hygiene - Progress: Progressing toward goals Miscellaneous OT Goals Miscellaneous OT Goal #1: pt will demonstrate residual limb desensitization mod I as precursor to limb wrapping and decr risk of phatom pains OT Goal: Miscellaneous Goal #1 - Progress: Goal set today  Visit Information  Last OT Received On: 08/28/12 Assistance Needed: +1    Subjective Data      Prior Functioning       Cognition  Overall Cognitive Status: Appears within functional limits for tasks assessed/performed Arousal/Alertness: Awake/alert Orientation Level: Appears intact for tasks assessed Behavior During Session: [facility] for tasks performed    Mobility  Shoulder Instructions Bed Mobility Details for Bed Mobility Assistance: pt sitting EOb on arrival - MOD I Transfers Sit to Stand: 4:  Min assist;From  chair/3-in-1;With upper extremity assist Stand to Sit: 4: Min assist;With upper extremity assist;To chair/3-in-1 (mod v/c for safety and proper positioning) Details for Transfer Assistance: pt educated on body position within RW for safety. Pt pushing RW ahead causing decreased balance during transfer and fatigue. Pt demonstrates fatigue requiring rest breaks after ~4 hops.        Exercises  Amputee Exercises Chair Push Up: Other (comment) (educated and given demonstration)   Balance     End of Session OT - End of Session Equipment Utilized During Treatment: Gait belt Activity Tolerance: Patient tolerated treatment well Patient left: in chair;with call bell/phone within reach Nurse Communication: Mobility status  GO     Veneda Melter 08/28/2012, 10:56 AM Pager: [phone]

## 2012-08-28 NOTE — Consult Note (Signed)
Physical Medicine and Rehabilitation Consult Reason for Consult: Right BKA Referring Physician: [facility]   HPI: [name] is a 46 y.o. right-handed female with history of uncontrolled type 2 diabetes mellitus with peripheral neuropathy and peripheral vascular disease. Admitted 08/21/2012 with chronic ulcers to the lower extremities with foul-smelling odor. Also reported poor appetite with nausea. Patient with chills and low-grade fever. White blood cell count on admission of 24,000. MRI scan right calcaneus revealed large abscess and osteomyelitis of the calcaneus. Orthopedic services consulted Dr. Sharol Given with noted ulcers throughout the midfoot and limb not felt to be salvageable. Underwent right below-knee amputation 08/25/2012 per Dr. Sharol Given. Postoperative pain control. Placed on subcutaneous heparin for DVT prophylaxis. Currently remains on broad-spectrum coverage antibiotics for multiple skin wounds. Noted anemia of 7.6 and monitored with admission hemoglobin of 8.7. Hemoglobin A1c of 9.3 with insulin coverage as directed. Physical and occupational therapy ongoing with recommendations of physical medicine rehabilitation consult to consider inpatient rehabilitation services   Review of Systems  Constitutional: Positive for fever and chills.       Poor appetite  Gastrointestinal: Positive for nausea and constipation.  Neurological: Positive for tingling and weakness.  All other systems reviewed and are negative.   Past Medical History  Diagnosis Date  . Diabetes mellitus type 2, uncontrolled   . Hypertension   . Anemia   . Alcohol dependency   . Diabetes mellitus     type 2  . Necrotizing fasciitis    Past Surgical History  Procedure Date  . Amputation     right first and left middle finger amputation 2/2 OM 08/2011 by Dr. Amedeo Plenty  . Cesarean section   . I&d left thumb 12/23/2011  . Tubal ligation   . I&d extremity 12/24/2011    Procedure: IRRIGATION AND DEBRIDEMENT EXTREMITY;   Surgeon: Tennis Must, MD;  Location: [place];  Service: Orthopedics;  Laterality: Left;  I&Dleft thumb with partial amputation  . I&d extremity 12/26/2011    Procedure: IRRIGATION AND DEBRIDEMENT EXTREMITY;  Surgeon: Tennis Must, MD;  Location: [place];  Service: Orthopedics;  Laterality: Left;  . I&d extremity 12/23/2011    Procedure: IRRIGATION AND DEBRIDEMENT EXTREMITY;  Surgeon: Tennis Must, MD;  Location: [place];  Service: Orthopedics;  Laterality: Left;  I&D Left thumb, hand and arm  . Amputation 01/18/2012    Procedure: AMPUTATION DIGIT;  Surgeon: Tennis Must, MD;  Location: [place];  Service: Orthopedics;  Laterality: Left;  revision amputation left thumb   Family History  Problem Relation Age of Onset  . Diabetes Mother   . Diabetes Father    Social History:  reports that she has never smoked. She has never used smokeless tobacco. She reports that she drinks about .6 ounces of alcohol per week. She reports that she does not use illicit drugs. Allergies: No Known Allergies Medications Prior to Admission  Medication Sig Dispense Refill  . insulin NPH-insulin regular (NOVOLIN 70/30) (70-30) 100 UNIT/ML injection Inject 23 Units into the skin 2 (two) times daily with a meal.      . lisinopril (PRINIVIL,ZESTRIL) 10 MG tablet Take 1 tablet (10 mg total) by mouth daily.  30 tablet  1  . metFORMIN (GLUCOPHAGE) 1000 MG tablet Take 1 tablet (1,000 mg total) by mouth 2 (two) times daily with a meal.  60 tablet  6    Home: Home Living Lives With: Daughter Available Help at Discharge: Family;Available PRN/intermittently Type of Home: Apartment Home Access: Level entry  Home Layout: Two level;Bed/bath upstairs Alternate Level Stairs-Number of Steps: 10 Alternate Level Stairs-Rails: Right;Left;Can reach both Bathroom Shower/Tub: Tub/shower unit;Curtain Bathroom Toilet: Standard Home Adaptive Equipment: None  Functional History: Prior Function Able to Take Stairs?:  Yes Driving: No Functional Status:  Mobility: Bed Mobility Bed Mobility: Sit to Supine Sit to Supine: 5: Supervision;HOB flat Transfers Transfers: Sit to Stand;Stand to Sit Sit to Stand: 4: Min assist;From chair/3-in-1;With upper extremity assist Stand to Sit: 4: Min assist;To bed;With upper extremity assist Ambulation/Gait Ambulation/Gait Assistance: 4: Min assist Ambulation Distance (Feet): 15 Feet Assistive device: Rolling walker Ambulation/Gait Assistance Details: (A) to maintain balance with cues for RW placement Gait Pattern: Step-to pattern    ADL: ADL Eating/Feeding: Performed;Independent Where Assessed - Eating/Feeding: Chair Grooming: Performed;Wash/dry hands;Min guard Where Assessed - Grooming: Supported standing Toilet Transfer: Pharmacologist Method: Sit to Loss adjuster, chartered:  (chair) Transfers/Ambulation Related to ADLs: min assist for steadying. Cueing for technique and sequencing. ADL Comments: Pt had spent several hours sitting up in chair prior to OT arrival and ready to return to bed. ADL assessment limited by fatigue.  Cognition: Cognition Arousal/Alertness: Awake/alert Orientation Level: Oriented X4 Cognition Overall Cognitive Status: Appears within functional limits for tasks assessed/performed Arousal/Alertness: Awake/alert Orientation Level: Appears intact for tasks assessed Behavior During Session: [facility] for tasks performed  Blood pressure 124/72, pulse 88, temperature 97.5 F (36.4 C), temperature source Oral, resp. rate 18, height 5\' 6"  (1.676 m), weight 81.693 kg (180 lb 1.6 oz), last menstrual period 08/13/2012, SpO2 100.00%. Physical Exam  Vitals reviewed. Constitutional: She is oriented to person, place, and time.  HENT:  Head: Normocephalic.  Eyes:       Pupils round and reactive to light  Neck: Neck supple. No thyromegaly present.  Cardiovascular: Normal rate and regular rhythm.    Pulmonary/Chest: Breath sounds normal. No respiratory distress.  Abdominal: Bowel sounds are normal. She exhibits no distension. There is no tenderness.  Musculoskeletal:       Left leg in post-op coban dressing. appropriatley shaped and tender.   Neurological: She is alert and oriented to person, place, and time. She has normal reflexes. No cranial nerve deficit.       Follows three-step commands. Good sitting balance. Sensory exam grossly intact.   Skin:       Postoperative dressing in place to below knee amputation site. There are multiple chronic skin lesions to the left lower extremity.  Psychiatric: She has a normal mood and affect.    Results for orders placed during the hospital encounter of 08/20/12 (from the past 24 hour(s))  GLUCOSE, CAPILLARY     Status: Abnormal   Collection Time   08/27/12  7:11 AM      Component Value Range   Glucose-Capillary 111 (*) 70 - 99 mg/dL  GLUCOSE, CAPILLARY     Status: Abnormal   Collection Time   08/27/12 11:22 AM      Component Value Range   Glucose-Capillary 214 (*) 70 - 99 mg/dL   Comment 1 Documented in Chart     Comment 2 Notify RN    GLUCOSE, CAPILLARY     Status: Abnormal   Collection Time   08/27/12  4:10 PM      Component Value Range   Glucose-Capillary 157 (*) 70 - 99 mg/dL   Comment 1 Documented in Chart     Comment 2 Notify RN    PREPARE RBC (CROSSMATCH)     Status: Normal   Collection Time  08/27/12  7:38 PM      Component Value Range   Order Confirmation ORDER PROCESSED BY BLOOD BANK     No results found.  Assessment/Plan: Diagnosis: left BKA 1. Does the need for close, 24 hr/day medical supervision in concert with the patient's rehab needs make it unreasonable for this patient to be served in a less intensive setting? Yes 2. Co-Morbidities requiring supervision/potential complications: htn, dm, aki, wound care 3. Due to bladder management, bowel management, safety, skin/wound care, disease management, medication  administration, pain management and patient education, does the patient require 24 hr/day rehab nursing? Yes 4. Does the patient require coordinated care of a physician, rehab nurse, PT (1-2 hrs/day, 5 days/week) and OT (1-2 hrs/day, 5 days/week) to address physical and functional deficits in the context of the above medical diagnosis(es)? Yes Addressing deficits in the following areas: balance, endurance, locomotion, strength, transferring, bowel/bladder control, bathing, dressing, feeding, grooming and toileting 5. Can the patient actively participate in an intensive therapy program of at least 3 hrs of therapy per day at least 5 days per week? Yes 6. The potential for patient to make measurable gains while on inpatient rehab is excellent 7. Anticipated functional outcomes upon discharge from inpatient rehab are mod I with PT, mod I with OT, n.a with SLP. 8. Estimated rehab length of stay to reach the above functional goals is: 7-10 days 9. Does the patient have adequate social supports to accommodate these discharge functional goals? Yes 10. Anticipated D/C setting: Home 11. Anticipated post D/C treatments: [place] therapy 12. Overall Rehab/Functional Prognosis: excellent  RECOMMENDATIONS: This patient's condition is appropriate for continued rehabilitative care in the following setting: CIR Patient has agreed to participate in recommended program. Yes Note that insurance prior authorization may be required for reimbursement for recommended care.  Comment:Rehab RN to follow up.   Oval Linsey, MD     08/28/2012

## 2012-08-29 ENCOUNTER — Encounter (HOSPITAL_COMMUNITY): Payer: Self-pay | Admitting: Orthopedic Surgery

## 2012-08-29 DIAGNOSIS — E876 Hypokalemia: Secondary | ICD-10-CM

## 2012-08-29 DIAGNOSIS — E875 Hyperkalemia: Secondary | ICD-10-CM

## 2012-08-29 LAB — TYPE AND SCREEN
ABO/RH(D): O NEG
Antibody Screen: NEGATIVE
Unit division: 0
Unit division: 0

## 2012-08-29 LAB — CBC
HCT: 27.3 % — ABNORMAL LOW (ref 36.0–46.0)
Hemoglobin: 8.5 g/dL — ABNORMAL LOW (ref 12.0–15.0)
MCH: 24.1 pg — ABNORMAL LOW (ref 26.0–34.0)
MCHC: 31.1 g/dL (ref 30.0–36.0)
MCV: 77.3 fL — ABNORMAL LOW (ref 78.0–100.0)
Platelets: 453 10*3/uL — ABNORMAL HIGH (ref 150–400)
RBC: 3.53 MIL/uL — ABNORMAL LOW (ref 3.87–5.11)
RDW: 16.6 % — ABNORMAL HIGH (ref 11.5–15.5)
WBC: 12.5 10*3/uL — ABNORMAL HIGH (ref 4.0–10.5)

## 2012-08-29 LAB — BASIC METABOLIC PANEL
BUN: 31 mg/dL — ABNORMAL HIGH (ref 6–23)
CO2: 28 mEq/L (ref 19–32)
Calcium: 8.3 mg/dL — ABNORMAL LOW (ref 8.4–10.5)
Chloride: 102 mEq/L (ref 96–112)
Creatinine, Ser: 1.12 mg/dL — ABNORMAL HIGH (ref 0.50–1.10)
GFR calc Af Amer: 68 mL/min — ABNORMAL LOW (ref >90)
GFR calc non Af Amer: 59 mL/min — ABNORMAL LOW (ref >90)
Glucose, Bld: 214 mg/dL — ABNORMAL HIGH (ref 70–99)
Potassium: 5.5 mEq/L — ABNORMAL HIGH (ref 3.5–5.1)
Sodium: 138 mEq/L (ref 135–145)

## 2012-08-29 MED ORDER — DOXYCYCLINE HYCLATE 100 MG PO TABS
100.0000 mg | ORAL_TABLET | Freq: Two times a day (BID) | ORAL | Status: DC
  Administered 2012-08-29 – 2012-08-30 (×2): 100 mg via ORAL
  Filled 2012-08-29 (×4): qty 1

## 2012-08-29 MED ORDER — SODIUM CHLORIDE 0.9 % IV SOLN
INTRAVENOUS | Status: DC
  Administered 2012-08-30: 01:00:00 via INTRAVENOUS

## 2012-08-29 MED ORDER — SODIUM POLYSTYRENE SULFONATE 15 GM/60ML PO SUSP
15.0000 g | Freq: Once | ORAL | Status: AC
  Administered 2012-08-29: 15 g via ORAL
  Filled 2012-08-29: qty 60

## 2012-08-29 NOTE — Progress Notes (Signed)
Clinical Social Work  CSW spoke with [facility] who is agreeable to accept patient. Per CIR, no beds available today. CSW met with patient who reported that CIR was her first option but is agreeable to [place]. AD to complete LOG. CSW will dc once all paperwork completed.  [place], [place] [phone] (Coverage for [facility]ell Bane)

## 2012-08-29 NOTE — Progress Notes (Signed)
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  S/p BKA (9/20)  Appreciate orthopedics  Vanc(9/16) and Imipenem (9/17)  Appreciate ID input--plan doxycycline at the time of discharge  -Discontinue vancomycin and imipenem, start doxycycline by mouth PMR consult appreciated  -If there continues to be no beds available in the inpatient unit, discharged to skilled nursing facility tomorrow .Acute kidney injury  Resolved with IV fluids  Continue to withhold metformin  Lisinopril restarted  .Diabetes mellitus type 2, uncontrolled Stable; continue home insulin 70/30, follow CBGs  ADA diet insulin scale insulin ordered  Pt not taking insulin x 2-3wks prior to admission  Prealbumin 10.2 --started Glucerna and beneprotein  .Alcohol dependency Continue thiamine  Anemia of chronic disease /iron deficiency  -Ferric gluconate given September 19  -Continue ferrous sulfate 3 times a day  -Transfused 2 units packed red blood cells (9/22)  Hypertension  -Continue lisinopril  -Added low-dose beta blocker--BP improving  Hyperkalemia -Kayexalate x1 dose -Intravenous fluids Disposition Plan: SNF if no beds in inpatient rehab Disposition Plan: Home when medically stable       Procedures/Studies: Mr Foot Right W Wo Contrast  08/28/2012  **ADDENDUM** CREATED: 08/24/2012 10:28:39  Dr. Sharol Given requested that the patient be returned for additional imaging of the entire foot to include postcontrast imaging.  That was performed on 08/23/2012 and includes images following the IV infusion of 17 ml Multihance.  The entire foot is included on these images.  There are small foci of susceptibility artifact superficially in the lateral subcutaneous fat adjacent to the calcaneus,  likely corresponding with the foreign body seen on the radiographs.  Surrounding these areas of artifact is a complex peripherally enhancing fluid collection wrapping along the lateral and dorsal aspect of the calcaneal tuberosity.  This measures approximately 4.6 cm anterior- posterior and 3.9 cm transverse.  The fluid extends to the cortex of the calcaneal tuberosity laterally near the insertion of the plantar fascia.  No cortical destruction is evident.  However, there is mild adjacent marrow edema and enhancement.  There is also enhancement within the plantar fascia.  No other focal fluid collections are identified.  The generalized soft tissue edema extending into the dorsum of the foot has progressed over this 2-day interval.  The subtalar and tibiotalar joints appear unremarkable.  In summary, follow-up imaging on 08/23/2012 demonstrates an ill- defined soft tissue abscess wrapping around the lateral and inferior aspect of the calcaneal tuberosity associated with apparent superficial small foreign bodies. The abscess demonstrates deep extension and abuts the lateral cortex of the tuberosity. Marrow edema and enhancement within the lateral aspect of the calcaneal tuberosity are nonspecific and may be secondary to hyperemia.  Early marrow infection cannot be excluded.  Findings were discussed by telephone with Dr. Sharol Given on 08/24/2012.  **END ADDENDUM** SIGNED BY: Vivia Ewing, M.D.   08/28/2012  **ADDENDUM** CREATED: 08/28/2012 09:46:10  The corrected title for this examination based on the patient having returned for postcontrast imaging is MRI OF THE RIGHT FOOT WITHOUT AND WITH CONTRAST.  **END ADDENDUM** SIGNED BY: Vivia Ewing, M.D.   08/22/2012  *RADIOLOGY REPORT*  Clinical Data: Medial right mid foot wound.  Multiple previous irrigation & drainages and amputations.  Question osteomyelitis.  MRI OF THE RIGHT FOREFOOT WITHOUT CONTRAST  Technique:  Multiplanar, multisequence MR imaging was  performed. No  intravenous contrast was administered (no IV access).  Comparison: Radiographs 08/20/2012.  Findings: The study includes the midfoot and forefoot.  The hind foot is not imaged.  The area of the plantar foreign body adjacent to the calcaneal tuberosity demonstrated on the prior radiographs is not imaged.  There is dermal thickening and skin ulceration along the plantar aspect of the midfoot extending medially.  This abuts the plantar fascia but extends no deeper.  The signal alteration is most pronounced on the T1-weighted images where there is loss of fat signal.  On the T2-weighted images, there is only mild signal alteration, most consistent with granulation tissue or fibrosis. There is no drainable fluid collection.  There is dorsal subcutaneous edema which is greatest laterally. There is a small amount of ill-defined fluid along the dorsal aspect of the extensor muscles in the midfoot.  There is no significant extensor tenosynovitis or drainable fluid collection. There is a small amount of flexor tendon sheath fluid within the midfoot which is commonly seen.  The underlying forefoot musculature demonstrates atrophy and nonspecific mild T2 hyperintensity, probably diabetic myopathy.  The bones of the forefoot and midfoot appear normal without evidence of osteomyelitis. The visualized calcaneus appears normal, although the tuberosity is not imaged.  IMPRESSION:  1.  Plantar midfoot skin ulceration with underlying subcutaneous inflammation/fibrosis.  No drainable fluid collection identified. 2.  Nonspecific dorsal subcutaneous edema in the forefoot. 3.  No deep soft tissue abscess, focal myositis or evidence of osteomyelitis in the midfoot or forefoot. 4.  Note a previously demonstrated soft tissue foreign body in the plantar aspect of the hind foot is not imaged.   Original Report Authenticated By: Vivia Ewing, M.D.    Dg Chest Portable 1 View  08/20/2012  *RADIOLOGY REPORT*  Clinical Data:  1-week history of nausea, vomiting, diarrhea.  PORTABLE CHEST - 1 VIEW  Comparison: 01/07/2012  Findings: Poor inspiratory effort.  No infiltrates or failure. Slight left base scarring.  Normal cardiomediastinal silhouette. No visible effusion or pneumothorax.  No change from priors.  IMPRESSION: Stable chest.   Original Report Authenticated By: Staci Righter, M.D.    Dg Foot Complete Right  08/20/2012  *RADIOLOGY REPORT*  Clinical Data: Nonhealing wound on the bottom of the foot.  RIGHT FOOT COMPLETE - 3+ VIEW  Comparison: Plain films 01/07/2012.  Findings: There is extensive soft tissue swelling about the foot. A radiopaque foreign body is identified in the subcutaneous tissues in the plantar aspect of the foot with an appearance most consistent with a piece of glass measuring 0.5 cm in diameter.  No bony destructive change is seen.  There is no fracture.  Plantar calcaneal spur is noted.  IMPRESSION: Extensive soft tissue swelling compatible with cellulitis. Radiopaque foreign body in the plantar soft tissues is identified as described above.  No plain film evidence of osteomyelitis.   Original Report Authenticated By: Arvid Right. Luther Parody, M.D.          Subjective: Patient is in good spirits. She denies any fevers, chills, chest pain, shortness breath, nausea, vomiting, diarrhea, abdominal pain. No dysuria no hematuria.  Objective: Filed Vitals:   08/28/12 0933 08/28/12 1400 08/28/12 2052 08/29/12 0534  BP: 132/85 155/87 152/84 153/88  Pulse: 96 86 94 86  Temp:  98.4 F (36.9 C) 99 F (37.2 C) 98.1 F (36.7 C)  TempSrc:   Oral   Resp:  16 18 18   Height:      Weight:      SpO2:  100% 100% 98%    Intake/Output Summary (Last 24 hours) at 08/29/12 1839 Last data filed at 08/29/12 1700  Gross per 24 hour  Intake 699.67 ml  Output      0 ml  Net 699.67 ml   Weight change:  Exam:   General:  Pt is alert, follows commands appropriately, not in acute distress  HEENT: No icterus,  No thrush, No neck mass, [place]/AT  Cardiovascular: RRR, S1/S2, no rubs, no gallops  Respiratory: Clear to auscultation bilaterally, no wheezing, no crackles, no rhonchi  Abdomen: Soft/+BS, non tender, non distended, no guarding  Extremities: Right stump wrapped without any crepitance or lymphangitis  Data Reviewed: Basic Metabolic Panel:  Lab [id number] 08/28/12 0750 08/27/12 0540 08/26/12 0530 08/25/12 1138  NA 138 139 138 142 140  K 5.5* 4.5 4.4 4.9 4.2  CL 102 103 103 106 107  CO2 28 29 28 25 24   GLUCOSE 214* 128* 112* 189* 152*  BUN 31* 19 22 15 14   CREATININE 1.12* 0.83 1.28* 0.96 0.83  CALCIUM 8.3* 8.9 8.5 8.7 8.2*  MG -- -- -- -- --  PHOS -- -- -- -- --   Liver Function Tests: No results found for this basename: AST:5,ALT:5,ALKPHOS:5,BILITOT:5,PROT:5,ALBUMIN:5 in the last 168 hours No results found for this basename: LIPASE:5,AMYLASE:5 in the last 168 hours No results found for this basename: AMMONIA:5 in the last 168 hours CBC:  Lab 08/29/12 0445 08/28/12 0750 08/27/12 0540 08/26/12 0530 08/25/12 1138  WBC 12.5* 14.3* 13.3* 13.8* 14.2*  NEUTROABS -- -- -- -- --  HGB 8.5* 10.5* 7.1* 7.6* 7.7*  HCT 27.3* 32.7* 23.7* 24.9* 25.1*  MCV 77.3* 76.9* 74.8* 74.1* 73.2*  PLT 453* 516* 476* 474* 493*   Cardiac Enzymes: No results found for this basename: CKTOTAL:5,CKMB:5,CKMBINDEX:5,TROPONINI:5 in the last 168 hours BNP: No components found with this basename: POCBNP:5 CBG:  Lab 08/27/12 1610 08/27/12 1122 08/27/12 0711 08/26/12 2222 08/26/12 1608  GLUCAP 157* 214* 111* 106* 220*    Recent Results (from the past 240 hour(s))  CULTURE, BLOOD (ROUTINE X 2)     Status: Normal   Collection Time   08/20/12 10:45 PM      Component Value Range Status Comment   Specimen Description BLOOD LEFT ARM   Final    Special Requests     Final    Value: BOTTLES DRAWN AEROBIC AND ANAEROBIC 6CC BLUE 5CC RED   Culture  Setup Time 08/21/2012 14:17   Final    Culture NO GROWTH 5 DAYS    Final    Report Status 08/27/2012 FINAL   Final   CULTURE, BLOOD (ROUTINE X 2)     Status: Normal   Collection Time   08/20/12 11:15 PM      Component Value Range Status Comment   Specimen Description BLOOD RIGHT HAND   Final    Special Requests BOTTLES DRAWN AEROBIC AND ANAEROBIC 2CC EA   Final    Culture  Setup Time 08/21/2012 14:16   Final    Culture NO GROWTH 5 DAYS   Final    Report Status 08/27/2012 FINAL   Final   CLOSTRIDIUM DIFFICILE BY PCR     Status: Normal   Collection Time   08/21/12  2:56 PM      Component Value Range Status Comment   C difficile by pcr NEGATIVE  NEGATIVE Final   SURGICAL PCR SCREEN     Status: Abnormal   Collection Time   08/25/12 12:24 AM  Component Value Range Status Comment   MRSA, PCR NEGATIVE  NEGATIVE Final    Staphylococcus aureus POSITIVE (*) NEGATIVE Final      Scheduled Meds:   . acetaminophen  650 mg Oral Once  . Chlorhexidine Gluconate Cloth  6 each Topical Q0600  . feeding supplement  237 mL Oral TID WC  . ferrous sulfate  325 mg Oral TID WC  . folic acid  1 mg Oral Daily  . gabapentin  300 mg Oral TID  . heparin  5,000 Units Subcutaneous Q8H  . imipenem-cilastatin  500 mg Intravenous Q6H  . insulin aspart  0-15 Units Subcutaneous TID WC  . insulin aspart  0-5 Units Subcutaneous QHS  . insulin aspart protamine-insulin aspart  23 Units Subcutaneous Q supper  . insulin aspart protamine-insulin aspart  30 Units Subcutaneous Q breakfast  . lisinopril  10 mg Oral Daily  . metoprolol tartrate  12.5 mg Oral BID  . multivitamin with minerals  1 tablet Oral Daily  . mupirocin ointment   Nasal BID  . protein supplement  1 scoop Oral TID WC  . senna  1 tablet Oral Daily  . sodium polystyrene  15 g Oral Once  . thiamine  100 mg Oral Daily   Or  . thiamine  100 mg Intravenous Daily  . vancomycin  1,250 mg Intravenous Q24H  . DISCONTD: lactulose  10 g Oral TID   Continuous Infusions:   . sodium chloride 20 mL/hr at 08/29/12 0300      Arcelia Pals, DO  [facility] Pager [phone]  If 7PM-7AM, please contact night-coverage [email or web address] Password [facility] 08/29/2012, 6:39 PM   LOS: 9 days

## 2012-08-29 NOTE — Progress Notes (Signed)
Clinical Social Work  Per MD, dc will be held until tomorrow. CSW notified SNF and AD of change in dc date. CSW will continue to follow.  [place], [place] [phone] (Coverage for [facility]ell [facility]ane)

## 2012-08-29 NOTE — Progress Notes (Signed)
Clinical Social Work  Patient was discussed during [place] [employer]. CSW reviewed offers via [employer]. No offers yet. CSW contacted [employer] who stated they are not accepting LOG at this time. CSW left a message with [place]. CSW called [place] who stated they would review case and would contact CSW regarding if they could make an offer. CSW is working with Surveyor, quantity who is making calls regarding Medicaid status. CSW will continue to follow.  [place], [place] [phone] (Coverage for Kendell Bane)

## 2012-08-29 NOTE — Discharge Summary (Addendum)
Physician Discharge Summary  [name] [id number] DOB: 1966-03-23 DOA: 08/20/2012  PCP: William Hamburger, MD  Admit date: 08/20/2012 Discharge date: 08/30/2012  Recommendations for Outpatient Follow-up:  1. 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 plan doxycycline i.e 14 days doxy, please follow final tissue culture results in 3-4 days     .Acute kidney injury  Resolved with IV fluids  Continue to withhold metformin  Lisinopril held due to recent high potassium, potassium today stable creatinine improved, repeat BMP in 3 days if potassium stays stable low-dose lisinopril can be restarted .    Marland KitchenDiabetes mellitus type 2, uncontrolled Stable; continue adjusted home does insulin 70/30 on with sliding scales q. a.c., follow CBGs  ADA diet insulin scale insulin ordered  Pt not taking insulin x 2-3wks prior to admission      .Alcohol dependency Continue thiamine and folic acid patient counseled to quit alcohol.   Anemia of chronic disease /iron deficiency  -Ferric gluconate given September 19  -Continue ferrous sulfate 3 times a day  -Transfused 2 units packed red blood cells (9/22)    Hypertension  -Continue on Lopressor and Norvasc, on it her blood pressure adjust medications as needed.   Disposition Plan: SNF if no beds in inpatient rehab    Discharge Condition: Stable  Disposition:      Follow-up Information    Follow up with DUDA,MARCUS V, MD. In 2 weeks.   Contact information:   [name] [id number] [phone]       Follow up with DEFAULT,PROVIDER, MD. Schedule an appointment as soon as possible for a visit in 3 days.   Contact information:   [name] [address] [phone]       Follow up with [name], MD. Schedule an appointment as soon as possible for a visit in 1 week.   Contact information:   [address]  [address] [place] [address] [phone]          Diet: 1800-calorie ADA diet Wt Readings from Last 3 Encounters:  08/21/12 81.693 kg (180 lb 1.6 oz)  08/21/12 81.693 kg (180 lb 1.6 oz)  03/02/12 91.173 kg (201 lb)    History of present illness:  46 year old female with multiple medical problems presented with 3 days of intractable nausea without vomiting. The nausea resolved, but after the patient began eating she developed diarrhea and vomiting. She had subjective fevers and chills. The patient had denied any chest pain or shortness of breath. The patient also complained of chronic ulcers on her legs and feet which were worsening and odor. Subsequent questioning revealed that the patient had not been taking her insulin for approximately 2-3 weeks prior to this admission. In addition, the patient states that she ran out of her testing strips , and as a result she had not been checking her sugars.   Hospital Course:  Wound care nurse was consulted for the patient's wounds. MRI of the right lower extremity was obtained. The initial reading did not suggest any drainable fluid collection, but it showed a small amount of fluid along the mid foot tendon sheaths. It also revealed a skin ulcer with subcutaneous  inflammation and fibrosis. Infectious disease was consulted, and the patient was started empirically on vancomycin and imipenem given her previous history of multi-drug-resistant organisms including Pseudomonas. Although the initial MRI did not suggest abscess or osteomyelitis physical examination revealed a fluctuant area on the plantar surface of the patient's foot draining purulent drainage. Orthopedic surgery was consulted, Dr. Sharol Given. The  initial MRI did not include the calcaneus. Another MRI of the foot to include the calcaneus was ordered. Dr. Sharol Given examined the patient's MRI of the foot, and it revealed a large abscess and osteomyelitis of the calcaneus. Due to the extensive area of involvement, he did not feel that there would be sufficient soft tissue coverage for partial calcaneal excision. As a result, he recommended a transtibial amputation. The patient agreed to this plan. The patient went to surgery on 08/25/2012 without any complications. Infectious disease recommended discharge home with doxycycline x2 weeks after the amputation. At the time of admission, the patient was also found to have acute kidney injury with a serum creatinine up to 1.56. The patient was given intravenous fluids with improvement of her renal function back to baseline. The patient was placed back on her home regimen of 70/30 insulin. Her Accu-Cheks were monitored and the 70/30 was subsequently increased to 21 units twice a day. She was maintained on NovoLog sliding scale also. Her sugars came under better control. Continue monitoring of her sugars revealed continued hyperglycemia as her oral intake improved. Her 70/30 insulin was increased to 30 units at breakfast and 23 units at bedtime. The patient was also found to have iron deficiency anemia. She was given an IV dose of ferric gluconate, and then she was started on ferrous sulfate. The patient was restarted on her lisinopril once her renal function came back to baseline. Her lipid panel showed an LDL of 69. At the time of discharge, the patient was instructed to continue metformin as an outpatient as well as lisinopril and 70/30 insulin. The patient's prealbumin was 10.2. As a result beneprotein and Glucerna was started.  The patient's hemoglobin was noted to drop to 7.1. Iron studies were performed and revealed iron deficiency anemia. The patient was given ferric gluconate IV x1 and then started on ferrous  sulfate 3 times a day. She will continue on ferrous sulfate 3 times a day x1 month then twice a day indefinitely. The patient was transfused with one unit of packed red blood cells on 08/27/2012. Her hemoglobin remained stable thereafter. The patient was noted to have some mild hyperkalemia on the day prior to discharge, 5.5. The patient was given a dose of Kayexalate and started on intravenous fluids x1 L. The patient was evaluated by the inpatient rehabilitation staff who deemed her to be appropriate. However there were no beds available. As a result, the patient will be discharged to skilled nursing facility for continued physical rehabilitation.    Consultants: Infectious disease Orthopedic surgery PM&R  Discharge Exam: Filed Vitals:   08/30/12 0608  BP: 154/86  Pulse: 88  Temp: 98.3 F (36.8 C)  Resp: 18   Filed Vitals:   08/28/12 2052 08/29/12 0534 08/29/12 2148 08/30/12 0608  BP: 152/84 153/88 158/109 154/86  Pulse: 94 86 100 88  Temp: 99 F (37.2 C) 98.1 F (36.7 C) 99.5 F (37.5 C) 98.3 F (36.8 C)  TempSrc: Oral     Resp: 18 18 18 18   Height:      Weight:      SpO2: 100% 98% 100% 100%  General: A&O x 3, NAD, pleasant, cooperative Cardiovascular: RRR, no rub, no gallop, no S3 Respiratory: CTAB, no wheeze, no rhonchi Abdomen:soft, nontender, nondistended, positive bowel sounds  Discharge Instructions     Medication List     As of 08/30/2012  3:18 PM    STOP taking these medications         lisinopril 10 MG tablet   Commonly known as: PRINIVIL,ZESTRIL      TAKE these medications         amLODipine 10 MG tablet   Commonly known as: NORVASC   Take 1 tablet (10 mg total) by mouth daily.      doxycycline 100 MG tablet   Commonly known as: VIBRA-TABS   Take 1 tablet (100 mg total) by mouth every 12 (twelve) hours. For 2 weeks, then per ID      ferrous sulfate 325 (65 FE) MG tablet   Take 1 tablet (325 mg total) by mouth 3 (three) times daily with  meals.      folic acid 1 MG tablet   Commonly known as: FOLVITE   Take 1 tablet (1 mg total) by mouth daily.      gabapentin 300 MG capsule   Commonly known as: NEURONTIN   Take 1 capsule (300 mg total) by mouth 3 (three) times daily.      HYDROcodone-acetaminophen 5-325 MG per tablet   Commonly known as: NORCO/VICODIN   Take 1 tablet by mouth every 4 (four) hours as needed for pain.      insulin aspart 100 UNIT/ML injection   Commonly known as: novoLOG   Inject 0-10 Units into the skin 3 (three) times daily with meals. Before each meal 3 times a day, 140-199 - 2 units, 200-250 - 4 units, 251-299 - 6 units,  300-349 - 8 units,  350 or above 10 units.      insulin NPH-insulin regular (70-30) 100 UNIT/ML injection   Commonly known as: NOVOLIN 70/30   Inject 26 Units into the skin 2 (two) times daily with a meal.      metFORMIN 1000 MG tablet   Commonly known as: GLUCOPHAGE   Take 1 tablet (1,000 mg total) by mouth 2 (two) times daily with a meal.      metoprolol tartrate 12.5 mg Tabs   Commonly known as: LOPRESSOR   Take 0.5 tablets (12.5 mg total) by mouth 2 (two) times daily.      thiamine 100 MG tablet   Take 1 tablet (100 mg total) by mouth daily.          The results of significant diagnostics from this hospitalization (including imaging, microbiology, ancillary and laboratory) are listed below for reference.    Significant Diagnostic Studies: Mr Foot Right W Wo Contrast  08/28/2012  **ADDENDUM** CREATED: 08/24/2012 10:28:39  Dr. Sharol Given requested that the patient be returned for additional imaging of the entire foot to include postcontrast imaging.  That was performed on 08/23/2012 and includes images following the IV infusion of 17 ml Multihance.  The entire foot is included on these images.  There are small foci of susceptibility artifact superficially in the lateral subcutaneous fat adjacent to the calcaneus, likely corresponding with the foreign body seen on the  radiographs.  Surrounding these areas of artifact is a complex peripherally enhancing fluid collection wrapping along the lateral and dorsal aspect of the calcaneal tuberosity.  This measures approximately 4.6 cm anterior- posterior and 3.9 cm transverse.  The fluid extends to the cortex  of the calcaneal tuberosity laterally near the insertion of the plantar fascia.  No cortical destruction is evident.  However, there is mild adjacent marrow edema and enhancement.  There is also enhancement within the plantar fascia.  No other focal fluid collections are identified.  The generalized soft tissue edema extending into the dorsum of the foot has progressed over this 2-day interval.  The subtalar and tibiotalar joints appear unremarkable.  In summary, follow-up imaging on 08/23/2012 demonstrates an ill- defined soft tissue abscess wrapping around the lateral and inferior aspect of the calcaneal tuberosity associated with apparent superficial small foreign bodies. The abscess demonstrates deep extension and abuts the lateral cortex of the tuberosity. Marrow edema and enhancement within the lateral aspect of the calcaneal tuberosity are nonspecific and may be secondary to hyperemia.  Early marrow infection cannot be excluded.  Findings were discussed by telephone with Dr. Sharol Given on 08/24/2012.  **END ADDENDUM** SIGNED BY: Vivia Ewing, M.D.   08/28/2012  **ADDENDUM** CREATED: 08/28/2012 09:46:10  The corrected title for this examination based on the patient having returned for postcontrast imaging is MRI OF THE RIGHT FOOT WITHOUT AND WITH CONTRAST.  **END ADDENDUM** SIGNED BY: Vivia Ewing, M.D.   08/22/2012  *RADIOLOGY REPORT*  Clinical Data: Medial right mid foot wound.  Multiple previous irrigation & drainages and amputations.  Question osteomyelitis.  MRI OF THE RIGHT FOREFOOT WITHOUT CONTRAST  Technique:  Multiplanar, multisequence MR imaging was performed. No intravenous contrast was administered (no IV  access).  Comparison: Radiographs 08/20/2012.  Findings: The study includes the midfoot and forefoot.  The hind foot is not imaged.  The area of the plantar foreign body adjacent to the calcaneal tuberosity demonstrated on the prior radiographs is not imaged.  There is dermal thickening and skin ulceration along the plantar aspect of the midfoot extending medially.  This abuts the plantar fascia but extends no deeper.  The signal alteration is most pronounced on the T1-weighted images where there is loss of fat signal.  On the T2-weighted images, there is only mild signal alteration, most consistent with granulation tissue or fibrosis. There is no drainable fluid collection.  There is dorsal subcutaneous edema which is greatest laterally. There is a small amount of ill-defined fluid along the dorsal aspect of the extensor muscles in the midfoot.  There is no significant extensor tenosynovitis or drainable fluid collection. There is a small amount of flexor tendon sheath fluid within the midfoot which is commonly seen.  The underlying forefoot musculature demonstrates atrophy and nonspecific mild T2 hyperintensity, probably diabetic myopathy.  The bones of the forefoot and midfoot appear normal without evidence of osteomyelitis. The visualized calcaneus appears normal, although the tuberosity is not imaged.  IMPRESSION:  1.  Plantar midfoot skin ulceration with underlying subcutaneous inflammation/fibrosis.  No drainable fluid collection identified. 2.  Nonspecific dorsal subcutaneous edema in the forefoot. 3.  No deep soft tissue abscess, focal myositis or evidence of osteomyelitis in the midfoot or forefoot. 4.  Note a previously demonstrated soft tissue foreign body in the plantar aspect of the hind foot is not imaged.   Original Report Authenticated By: Vivia Ewing, M.D.    Dg Chest Portable 1 View  08/20/2012  *RADIOLOGY REPORT*  Clinical Data: 1-week history of nausea, vomiting, diarrhea.  PORTABLE  CHEST - 1 VIEW  Comparison: 01/07/2012  Findings: Poor inspiratory effort.  No infiltrates or failure. Slight left base scarring.  Normal cardiomediastinal silhouette. No visible effusion or pneumothorax.  No change  from priors.  IMPRESSION: Stable chest.   Original Report Authenticated By: Staci Righter, M.D.    Dg Foot Complete Right  08/20/2012  *RADIOLOGY REPORT*  Clinical Data: Nonhealing wound on the bottom of the foot.  RIGHT FOOT COMPLETE - 3+ VIEW  Comparison: Plain films 01/07/2012.  Findings: There is extensive soft tissue swelling about the foot. A radiopaque foreign body is identified in the subcutaneous tissues in the plantar aspect of the foot with an appearance most consistent with a piece of glass measuring 0.5 cm in diameter.  No bony destructive change is seen.  There is no fracture.  Plantar calcaneal spur is noted.  IMPRESSION: Extensive soft tissue swelling compatible with cellulitis. Radiopaque foreign body in the plantar soft tissues is identified as described above.  No plain film evidence of osteomyelitis.   Original Report Authenticated By: Arvid Right. Luther Parody, M.D.      Microbiology: Recent Results (from the past 240 hour(s))  CULTURE, BLOOD (ROUTINE X 2)     Status: Normal   Collection Time   08/20/12 10:45 PM      Component Value Range Status Comment   Specimen Description BLOOD LEFT ARM   Final    Special Requests     Final    Value: BOTTLES DRAWN AEROBIC AND ANAEROBIC 6CC BLUE 5CC RED   Culture  Setup Time 08/21/2012 14:17   Final    Culture NO GROWTH 5 DAYS   Final    Report Status 08/27/2012 FINAL   Final   CULTURE, BLOOD (ROUTINE X 2)     Status: Normal   Collection Time   08/20/12 11:15 PM      Component Value Range Status Comment   Specimen Description BLOOD RIGHT HAND   Final    Special Requests BOTTLES DRAWN AEROBIC AND ANAEROBIC 2CC EA   Final    Culture  Setup Time 08/21/2012 14:16   Final    Culture NO GROWTH 5 DAYS   Final    Report Status  08/27/2012 FINAL   Final   CLOSTRIDIUM DIFFICILE BY PCR     Status: Normal   Collection Time   08/21/12  2:56 PM      Component Value Range Status Comment   C difficile by pcr NEGATIVE  NEGATIVE Final   SURGICAL PCR SCREEN     Status: Abnormal   Collection Time   08/25/12 12:24 AM      Component Value Range Status Comment   MRSA, PCR NEGATIVE  NEGATIVE Final    Staphylococcus aureus POSITIVE (*) NEGATIVE Final      Labs: Basic Metabolic Panel:  Lab [id number] 08/29/12 0445 08/28/12 0750 08/27/12 0540 08/26/12 0530 08/25/12 1138  NA -- 138 139 138 142 140  K 5.1 5.5* -- -- -- --  CL -- 102 103 103 106 107  CO2 -- 28 29 28 25 24   GLUCOSE -- 214* 128* 112* 189* 152*  BUN -- 31* 19 22 15 14   CREATININE -- 1.12* 0.83 1.28* 0.96 0.83  CALCIUM -- 8.3* 8.9 8.5 8.7 8.2*  MG -- -- -- -- -- --  PHOS -- -- -- -- -- --   Liver Function Tests: No results found for this basename: AST:5,ALT:5,ALKPHOS:5,BILITOT:5,PROT:5,ALBUMIN:5 in the last 168 hours No results found for this basename: LIPASE:5,AMYLASE:5 in the last 168 hours No results found for this basename: AMMONIA:5 in the last 168 hours CBC:  Lab 08/29/12 0445 08/28/12 0750 08/27/12 0540 08/26/12 0530 08/25/12 1138  WBC 12.5*  14.3* 13.3* 13.8* 14.2*  NEUTROABS -- -- -- -- --  HGB 8.5* 10.5* 7.1* 7.6* 7.7*  HCT 27.3* 32.7* 23.7* 24.9* 25.1*  MCV 77.3* 76.9* 74.8* 74.1* 73.2*  PLT 453* 516* 476* 474* 493*   Cardiac Enzymes: No results found for this basename: CKTOTAL:5,CKMB:5,CKMBINDEX:5,TROPONINI:5 in the last 168 hours BNP: No components found with this basename: POCBNP:5 CBG:  Lab 08/30/12 1220 08/30/12 0706 08/29/12 2147 08/29/12 1635 08/29/12 1118  GLUCAP 139* 203* 214* 221* 196*    Time coordinating discharge:  Greater than 30 minutes  Signed:  Thurnell Lose, DO [facility] Pager: [phone] 08/30/2012, 3:18 PM

## 2012-08-29 NOTE — Progress Notes (Signed)
CARE MANAGEMENT NOTE 08/29/2012  Patient:  [name]   Account Number:  [id number]  Date Initiated:  08/29/2012  Documentation initiated by:  Ricki Miller  Subjective/Objective Assessment:   46 yr old female adm for nausea, vomiting,diarrhea. 08/25/12 patient had right BKA.     Action/Plan:   Patient candidate for CIR but no beds available. Bed offer from [facility].   Anticipated DC Date:  08/30/2012   Anticipated DC Plan:  SKILLED NURSING FACILITY  In-house referral  Clinical Social Worker      DC Planning Services  CM consult      Choice offered to / List presented to:             Status of service:  Completed, signed off Medicare Important Message given?   (If response is "NO", the following Medicare IM given date fields will be blank) Date Medicare IM given:   Date Additional Medicare IM given:    Discharge Disposition:  SKILLED NURSING FACILITY  Per UR Regulation:    If discussed at Long Length of Stay Meetings, dates discussed:    Comments:

## 2012-08-29 NOTE — Progress Notes (Signed)
Met with pt at bedside. Explained that we continue to await bed availalbility at CIR. Pt understands that SW is also working on SNF placement. Pt states that she would prefer CIR but that SNF would be okay if necessary. Will continue to f/u daily. Please call for questions: [phone].

## 2012-08-29 NOTE — Progress Notes (Signed)
Physical Therapy Treatment Patient Details Name: [name] MRN: [id number] DOB: [birth year] Today's Date: 08/29/2012 Time: [id number] PT Time Calculation (min): 24 min  PT Assessment / Plan / Recommendation Comments on Treatment Session  Pt became very fatigued during therapy session.  During the first step of ambulation towards the bathroom pt lost balance and sat back down onto bed.  Nurse was notified about fatigue.     Follow Up Recommendations  Inpatient Rehab    Barriers to Discharge        Equipment Recommendations  Rolling walker with 5" wheels;3 in 1 bedside comode    Recommendations for Other Services Rehab consult  Frequency Min 5X/week   Plan Discharge plan remains appropriate;Frequency remains appropriate    Precautions / Restrictions Precautions Precautions: Fall Restrictions Weight Bearing Restrictions: Yes RLE Weight Bearing: Non weight bearing   Pertinent Vitals/Pain Pt reports having no pain.  Pt limited due to fatigue and low Hgb.    Mobility  Bed Mobility Bed Mobility: Not assessed Transfers Transfers: Sit to Stand;Stand to Sit;Stand Pivot Transfers Sit to Stand: 4: Min assist;From bed;From toilet;With upper extremity assist Stand to Sit: 4: Min assist;With upper extremity assist;To chair/3-in-1;To toilet Stand Pivot Transfers: 4: Min assist Details for Transfer Assistance: (A) with balance and safety during transfers.  Cues for RW placement and hand placement Ambulation/Gait Ambulation/Gait Assistance: Not tested (comment) Due to overall fatigue   Exercises Amputee Exercises Quad Sets: AROM;Both;5 reps Gluteal Sets: Both;5 reps Knee Flexion: AROM;Both;5 reps Knee Extension: Other (comment) (SAQ;Both;5 reps - LAQ;Both;5 reps) Straight Leg Raises: AROM;Both;5 reps   PT Diagnosis:    PT Problem List:   PT Treatment Interventions:     PT Goals Acute Rehab PT Goals PT Goal Formulation: With patient Time For Goal Achievement:  09/02/12 Potential to Achieve Goals: Good Pt will go Sit to Stand: with supervision PT Goal: Sit to Stand - Progress: Not met Pt will go Stand to Sit: with supervision PT Goal: Stand to Sit - Progress: Not met Pt will Transfer Bed to Chair/Chair to Bed: with supervision PT Transfer Goal: Bed to Chair/Chair to Bed - Progress: Progressing toward goal Pt will Ambulate: 16 - 50 feet;with supervision;with rolling walker PT Goal: Ambulate - Progress: Not met Pt will Perform Home Exercise Program: Independently PT Goal: Perform Home Exercise Program - Progress: Progressing toward goal  Visit Information  Last PT Received On: 08/29/12 Assistance Needed: +1    Subjective Data  Subjective: I am feeling weak this morning. Patient Stated Goal: To eventually go home   Cognition  Overall Cognitive Status: Appears within functional limits for tasks assessed/performed Arousal/Alertness: Awake/alert Orientation Level: Appears intact for tasks assessed Behavior During Session: [facility] for tasks performed    Balance     End of Session PT - End of Session Equipment Utilized During Treatment: Gait belt Activity Tolerance: Patient limited by fatigue Patient left: in chair;with call bell/phone within reach Nurse Communication: Mobility status   GP     DITOMMASO, AMY 08/29/2012, 8:55 AM Antoine Poche, [place] DPT [phone]

## 2012-08-30 LAB — GLUCOSE, CAPILLARY
Glucose-Capillary: 156 mg/dL — ABNORMAL HIGH (ref 70–99)
Glucose-Capillary: 174 mg/dL — ABNORMAL HIGH (ref 70–99)
Glucose-Capillary: 196 mg/dL — ABNORMAL HIGH (ref 70–99)
Glucose-Capillary: 203 mg/dL — ABNORMAL HIGH (ref 70–99)
Glucose-Capillary: 209 mg/dL — ABNORMAL HIGH (ref 70–99)
Glucose-Capillary: 210 mg/dL — ABNORMAL HIGH (ref 70–99)
Glucose-Capillary: 214 mg/dL — ABNORMAL HIGH (ref 70–99)
Glucose-Capillary: 221 mg/dL — ABNORMAL HIGH (ref 70–99)
Glucose-Capillary: 229 mg/dL — ABNORMAL HIGH (ref 70–99)
Glucose-Capillary: 80 mg/dL (ref 70–99)
Glucose-Capillary: 139 mg/dL — ABNORMAL HIGH (ref 70–99)

## 2012-08-30 LAB — POTASSIUM: Potassium: 5.1 mEq/L (ref 3.5–5.1)

## 2012-08-30 MED ORDER — THIAMINE HCL 100 MG PO TABS: 100.0000 mg | ORAL_TABLET | Freq: Every day | ORAL | Status: DC

## 2012-08-30 MED ORDER — GABAPENTIN 300 MG PO CAPS: 300.0000 mg | ORAL_CAPSULE | Freq: Three times a day (TID) | ORAL | Status: DC

## 2012-08-30 MED ORDER — DOXYCYCLINE HYCLATE 100 MG PO TABS: 100.0000 mg | ORAL_TABLET | Freq: Two times a day (BID) | ORAL | Status: DC

## 2012-08-30 MED ORDER — AMLODIPINE BESYLATE 10 MG PO TABS: 10.0000 mg | ORAL_TABLET | Freq: Every day | ORAL | Status: DC

## 2012-08-30 MED ORDER — INSULIN ASPART 100 UNIT/ML ~~LOC~~ SOLN
0.0000 [IU] | Freq: Three times a day (TID) | SUBCUTANEOUS | Status: DC
Start: 2012-08-30 — End: 2015-07-13

## 2012-08-30 MED ORDER — FOLIC ACID 1 MG PO TABS: 1.0000 mg | ORAL_TABLET | Freq: Every day | ORAL | Status: DC

## 2012-08-30 MED ORDER — HYDROCODONE-ACETAMINOPHEN 5-325 MG PO TABS: 1.0000 | ORAL_TABLET | ORAL | Status: DC | PRN

## 2012-08-30 MED ORDER — INSULIN NPH ISOPHANE & REGULAR (70-30) 100 UNIT/ML ~~LOC~~ SUSP: 26.0000 [IU] | Freq: Two times a day (BID) | SUBCUTANEOUS | Status: DC

## 2012-08-30 MED ORDER — FERROUS SULFATE 325 (65 FE) MG PO TABS: 325.0000 mg | ORAL_TABLET | Freq: Three times a day (TID) | ORAL | Status: DC

## 2012-08-30 MED ORDER — METOPROLOL TARTRATE 12.5 MG HALF TABLET: 12.5000 mg | ORAL_TABLET | Freq: Two times a day (BID) | ORAL | Status: DC

## 2012-08-30 MED ORDER — INSULIN NPH ISOPHANE & REGULAR (70-30) 100 UNIT/ML ~~LOC~~ SUSP: 30.0000 [IU] | Freq: Two times a day (BID) | SUBCUTANEOUS | Status: DC

## 2012-08-30 NOTE — Progress Notes (Signed)
Agree with PT treatment note.  [place], [place] DPT [phone]

## 2012-08-30 NOTE — Progress Notes (Signed)
Physical Therapy Progress Note   08/30/12 0900  PT Visit Information  Last PT Received On 08/30/12  Assistance Needed +1  PT Time Calculation  PT Start Time 0836  PT Stop Time 0900  PT Time Calculation (min) 24 min  Subjective Data  Subjective I am feeling stronger this week  Patient Stated Goal To stay here for rehab  Precautions  Precautions Fall  Restrictions  Weight Bearing Restrictions Yes  RLE Weight Bearing NWB  Cognition  Overall Cognitive Status Appears within functional limits for tasks assessed/performed  Arousal/Alertness Awake/alert  Orientation Level Appears intact for tasks assessed  Behavior During Session [facility] for tasks performed  Bed Mobility  Bed Mobility Not assessed  Transfers  Transfers Sit to Stand;Stand to Sit  Sit to Stand 4: Min assist;With upper extremity assist;From bed  Stand to Sit 4: Min assist;With upper extremity assist;To chair/3-in-1  Details for Transfer Assistance (A) with balance and safety during transfer.  Cues for proper technique  Ambulation/Gait  Ambulation/Gait Assistance 4: Min assist  Ambulation Distance (Feet) 20 Feet  Assistive device Rolling walker  Ambulation/Gait Assistance Details (A) to maintain balance and cues for proper technique  Gait Pattern Step-to pattern  Gait velocity decreased  Stairs No  Wheelchair Mobility  Wheelchair Mobility No  Exercises  Exercises Amputee  Amputee Exercises  Quad Sets AROM;Both;10 reps  Knee Flexion AROM;Both;10 reps  Gluteal Sets Both;10 reps  Knee Extension Other (comment) (SAQ;Both;10 reps)  PT - End of Session  Equipment Utilized During Treatment Gait belt  Activity Tolerance Patient tolerated treatment well  Patient left in chair;with call bell/phone within reach;with nursing in room  Nurse Communication Mobility status  PT - Assessment/Plan  Comments on Treatment Session Pt was able to increase ambulation distance and improve posture.  Pt has decreased caregiver needs due  to daughters medical needs at this time.  Pt would benefit from inpatient rehab to increase safety, balance, and endurance prior to d/c home.  PT Plan Discharge plan remains appropriate;Frequency remains appropriate  PT Frequency Min 5X/week  Recommendations for Other Services Rehab consult  Follow Up Recommendations Inpatient Rehab (If no bed available then pt will need SNF)  Equipment Recommended Rolling walker with 5" wheels;3 in 1 bedside comode  Acute Rehab PT Goals  PT Goal Formulation With patient  Time For Goal Achievement 09/02/12  Potential to Achieve Goals Good  Pt will go Sit to Stand with supervision  PT Goal: Sit to Stand - Progress Progressing toward goal  Pt will go Stand to Sit with supervision  PT Goal: Stand to Sit - Progress Progressing toward goal  Pt will Ambulate 16 - 50 feet;with supervision;with rolling walker  PT Goal: Ambulate - Progress Partly met  Pt will Perform Home Exercise Program Independently  PT Goal: Perform Home Exercise Program - Progress Not met  PT General Charges  $$ ACUTE PT VISIT 1 Procedure  PT Treatments  $Gait Training 8-22 mins  $Therapeutic Exercise 8-22 mins    Reports no pain.  Callia Swim, SPT

## 2012-08-31 NOTE — Progress Notes (Signed)
Utilization review completed. Alleen Kehm, RN, BSN. 

## 2012-09-12 ENCOUNTER — Encounter: Payer: Self-pay | Admitting: Infectious Disease

## 2012-09-12 ENCOUNTER — Ambulatory Visit (INDEPENDENT_AMBULATORY_CARE_PROVIDER_SITE_OTHER): Payer: Self-pay | Admitting: Infectious Disease

## 2012-09-12 VITALS — BP 146/87 | HR 87 | Temp 98.3°F | Wt 195.0 lb

## 2012-09-12 DIAGNOSIS — E1169 Type 2 diabetes mellitus with other specified complication: Secondary | ICD-10-CM

## 2012-09-12 DIAGNOSIS — L089 Local infection of the skin and subcutaneous tissue, unspecified: Secondary | ICD-10-CM

## 2012-09-12 DIAGNOSIS — E11628 Type 2 diabetes mellitus with other skin complications: Secondary | ICD-10-CM

## 2012-09-12 LAB — CBC WITH DIFFERENTIAL/PLATELET
Basophils Absolute: 0.1 10*3/uL (ref 0.0–0.1)
Basophils Relative: 1 % (ref 0–1)
Eosinophils Absolute: 0.5 10*3/uL (ref 0.0–0.7)
Eosinophils Relative: 7 % — ABNORMAL HIGH (ref 0–5)
HCT: 30.7 % — ABNORMAL LOW (ref 36.0–46.0)
Hemoglobin: 9.7 g/dL — ABNORMAL LOW (ref 12.0–15.0)
Lymphocytes Relative: 27 % (ref 12–46)
Lymphs Abs: 1.9 10*3/uL (ref 0.7–4.0)
MCH: 24.5 pg — ABNORMAL LOW (ref 26.0–34.0)
MCHC: 31.6 g/dL (ref 30.0–36.0)
MCV: 77.5 fL — ABNORMAL LOW (ref 78.0–100.0)
Monocytes Absolute: 0.5 10*3/uL (ref 0.1–1.0)
Monocytes Relative: 7 % (ref 3–12)
Neutro Abs: 4.2 10*3/uL (ref 1.7–7.7)
Neutrophils Relative %: 58 % (ref 43–77)
Platelets: 399 10*3/uL (ref 150–400)
RBC: 3.96 MIL/uL (ref 3.87–5.11)
RDW: 16.4 % — ABNORMAL HIGH (ref 11.5–15.5)
WBC: 7.2 10*3/uL (ref 4.0–10.5)

## 2012-09-12 LAB — BASIC METABOLIC PANEL WITH GFR
BUN: 27 mg/dL — ABNORMAL HIGH (ref 6–23)
CO2: 24 mEq/L (ref 19–32)
Calcium: 9.2 mg/dL (ref 8.4–10.5)
Chloride: 105 mEq/L (ref 96–112)
Creat: 0.94 mg/dL (ref 0.50–1.10)
GFR, Est African American: 85 mL/min
GFR, Est Non African American: 74 mL/min
Glucose, Bld: 100 mg/dL — ABNORMAL HIGH (ref 70–99)
Potassium: 4.9 mEq/L (ref 3.5–5.3)
Sodium: 137 mEq/L (ref 135–145)

## 2012-09-12 LAB — SEDIMENTATION RATE: Sed Rate: 82 mm/hr — ABNORMAL HIGH (ref 0–22)

## 2012-09-12 LAB — C-REACTIVE PROTEIN: CRP: <0.5 mg/dL (ref <0.60)

## 2012-09-12 NOTE — Progress Notes (Signed)
  Subjective:    Patient ID: [name], female    DOB: September 17, 1966, 46 y.o.   MRN: [id number]  HPI  46 year old woman with diabetes peripheral vascular disease who presents with abscess ulceration osteomyelitis of the right calcaneus. She was placed on vancomycin and imipenem in the hospital. She underwent a below the knee and dictation by Dr. Sharol Given on September 20. She completed imipenem and vancomycin while in the hospital and was placed on doxycycline which has remained on since then having bleeding more than 2 weeks of antibiotics postoperatively. Her site is now healing well and she has minimal pain at the operative site there is no discharge and she has no fevers nausea or systemic symptoms.  [facility]  Review of Systems  Constitutional: Negative for fever, chills, diaphoresis, activity change, appetite change, fatigue and unexpected weight change.  HENT: Negative for congestion, sore throat, rhinorrhea, sneezing, trouble swallowing and sinus pressure.   Eyes: Negative for photophobia and visual disturbance.  Respiratory: Negative for cough, chest tightness, shortness of breath, wheezing and stridor.   Cardiovascular: Negative for chest pain, palpitations and leg swelling.  Gastrointestinal: Negative for nausea, vomiting, abdominal pain, diarrhea, constipation, blood in stool, abdominal distention and anal bleeding.  Genitourinary: Negative for dysuria, hematuria, flank pain and difficulty urinating.  Musculoskeletal: Negative for myalgias, back pain, joint swelling, arthralgias and gait problem.  Skin: Positive for wound. Negative for color change, pallor and rash.  Neurological: Negative for dizziness, tremors, weakness and light-headedness.  Hematological: Negative for adenopathy. Does not bruise/bleed easily.  Psychiatric/Behavioral: Negative for behavioral problems, confusion, disturbed wake/sleep cycle, dysphoric mood, decreased concentration and agitation.         Objective:   Physical Exam  Constitutional: She is oriented to person, place, and time. She appears well-developed and well-nourished. No distress.  HENT:  Head: Normocephalic and atraumatic.  Mouth/Throat: Oropharynx is clear and moist. No oropharyngeal exudate.  Eyes: Conjunctivae normal and EOM are normal. Pupils are equal, round, and reactive to light. No scleral icterus.  Neck: Normal range of motion. Neck supple. No JVD present.  Cardiovascular: Normal rate, regular rhythm and normal heart sounds.  Exam reveals no gallop and no friction rub.   No murmur heard. Pulmonary/Chest: Effort normal and breath sounds normal. No respiratory distress. She has no wheezes. She has no rales. She exhibits no tenderness.  Abdominal: She exhibits no distension and no mass. There is no tenderness. There is no rebound and no guarding.  Musculoskeletal: She exhibits no edema and no tenderness.       Legs: Lymphadenopathy:    She has no cervical adenopathy.  Neurological: She is alert and oriented to person, place, and time. She has normal reflexes. She exhibits normal muscle tone. Coordination normal.  Skin: Skin is warm and dry. She is not diaphoretic. No erythema. No pallor.  Psychiatric: She has a normal mood and affect. Her behavior is normal. Judgment and thought content normal.          Assessment & Plan:  Diabetic foot infection Status post proximal amputation. This should been cured with surgery. We'll stop her antibiotics will recheck inflammatory markers today and have her followup in 6 weeks' time.

## 2012-09-12 NOTE — Assessment & Plan Note (Signed)
Status post proximal amputation. This should been cured with surgery. We'll stop her antibiotics will recheck inflammatory markers today and have her followup in 6 weeks' time.

## 2012-09-12 NOTE — Patient Instructions (Addendum)
tHis infection SHOULD be cured  We will check labs today and you can followup with [place] in 6 weeks time  You DO NOT NEED FURTHER DOXYCYLINE

## 2012-10-30 ENCOUNTER — Encounter: Payer: Self-pay | Admitting: Infectious Disease

## 2012-10-30 ENCOUNTER — Ambulatory Visit (INDEPENDENT_AMBULATORY_CARE_PROVIDER_SITE_OTHER): Payer: Self-pay | Admitting: Infectious Disease

## 2012-10-30 VITALS — BP 159/94 | HR 86 | Temp 98.2°F | Wt 195.0 lb

## 2012-10-30 DIAGNOSIS — L03119 Cellulitis of unspecified part of limb: Secondary | ICD-10-CM

## 2012-10-30 DIAGNOSIS — M869 Osteomyelitis, unspecified: Secondary | ICD-10-CM

## 2012-10-30 DIAGNOSIS — E119 Type 2 diabetes mellitus without complications: Secondary | ICD-10-CM

## 2012-10-30 DIAGNOSIS — L02619 Cutaneous abscess of unspecified foot: Secondary | ICD-10-CM

## 2012-10-30 LAB — SEDIMENTATION RATE: Sed Rate: 55 mm/hr — ABNORMAL HIGH (ref 0–22)

## 2012-10-30 NOTE — Progress Notes (Signed)
  Subjective:    Patient ID: [name], female    DOB: 12-14-1965, 46 y.o.   MRN: [id number]  HPI  46 year old woman with diabetes peripheral vascular disease who presents with abscess ulceration osteomyelitis of the right calcaneus. She was placed on vancomycin and imipenem in the hospital. She underwent a below the knee and dictation by Dr. Sharol Given on September 20. She completed imipenem and vancomycin while in the hospital and was placed on doxycycline which has remained on for more than 2 weeks of antibiotics postoperatively. I saw her in October and she was doing well. She did have an area of wound breakdown where sutures were removed. There was drainage for some time but now no longer. She is without fevers, chills, nausea or malaise.    Review of Systems  Constitutional: Negative for fever, chills, diaphoresis, activity change, appetite change, fatigue and unexpected weight change.  HENT: Negative for congestion, sore throat, rhinorrhea, sneezing, trouble swallowing and sinus pressure.   Eyes: Negative for photophobia and visual disturbance.  Respiratory: Negative for cough, chest tightness, shortness of breath, wheezing and stridor.   Cardiovascular: Negative for chest pain, palpitations and leg swelling.  Gastrointestinal: Negative for nausea, vomiting, abdominal pain, diarrhea, constipation, blood in stool, abdominal distention and anal bleeding.  Genitourinary: Negative for dysuria, hematuria, flank pain and difficulty urinating.  Musculoskeletal: Negative for myalgias, back pain, joint swelling, arthralgias and gait problem.  Skin: Negative for color change, pallor, rash and wound.  Neurological: Negative for dizziness, tremors, weakness and light-headedness.  Hematological: Negative for adenopathy. Does not bruise/bleed easily.  Psychiatric/Behavioral: Negative for behavioral problems, confusion, sleep disturbance, dysphoric mood, decreased concentration and agitation.         Objective:   Physical Exam  Constitutional: She is oriented to person, place, and time. She appears well-developed and well-nourished. No distress.  HENT:  Head: Normocephalic and atraumatic.  Mouth/Throat: Oropharynx is clear and moist. No oropharyngeal exudate.  Eyes: Conjunctivae normal and EOM are normal. Pupils are equal, round, and reactive to light. No scleral icterus.  Neck: Normal range of motion. Neck supple. No JVD present.  Cardiovascular: Normal rate, regular rhythm and normal heart sounds.  Exam reveals no gallop and no friction rub.   No murmur heard. Pulmonary/Chest: Effort normal and breath sounds normal. No respiratory distress. She has no wheezes. She has no rales. She exhibits no tenderness.  Abdominal: She exhibits no distension and no mass. There is no tenderness. There is no rebound and no guarding.  Musculoskeletal: She exhibits no edema and no tenderness.  Lymphadenopathy:    She has no cervical adenopathy.  Neurological: She is alert and oriented to person, place, and time. She has normal reflexes. She exhibits normal muscle tone. Coordination normal.  Skin: Skin is warm and dry. She is not diaphoretic. No erythema. No pallor.  Psychiatric: She has a normal mood and affect. Her behavior is normal. Judgment and thought content normal.          Assessment & Plan:  Diabetic foot infection  Status post proximal amputation. This should been cured with surgery. Will recheck ESR today for re-assurance

## 2013-05-07 ENCOUNTER — Emergency Department (INDEPENDENT_AMBULATORY_CARE_PROVIDER_SITE_OTHER)
Admission: EM | Admit: 2013-05-07 | Discharge: 2013-05-07 | Disposition: A | Discharge status: Home / Self Care | Payer: Medicaid Other | Source: Home / Self Care | Attending: Emergency Medicine | Admitting: Emergency Medicine

## 2013-05-07 ENCOUNTER — Encounter (HOSPITAL_COMMUNITY): Payer: Self-pay | Admitting: Emergency Medicine

## 2013-05-07 DIAGNOSIS — H510 Palsy (spasm) of conjugate gaze: Secondary | ICD-10-CM

## 2013-05-07 DIAGNOSIS — H359 Unspecified retinal disorder: Secondary | ICD-10-CM

## 2013-05-07 DIAGNOSIS — H539 Unspecified visual disturbance: Secondary | ICD-10-CM

## 2013-05-07 LAB — GLUCOSE, CAPILLARY: Glucose-Capillary: 177 mg/dL — ABNORMAL HIGH (ref 70–99)

## 2013-05-07 NOTE — ED Provider Notes (Signed)
History     CSN: [id number]  Arrival date & time 05/07/13  1400   First MD Initiated Contact with Patient 05/07/13 1507      Chief Complaint  Patient presents with  . Visual Field Change    (Consider location/radiation/quality/duration/timing/severity/associated sxs/prior treatment) HPI Comments: Patient presents urgent care describing that for approximately a month she has lost vision out of her right eye, and have also noticed blurry vision out of her left eye. She denies any eye injury, denies any headaches, denies any paresthesias of her face upper or lower extremities, no headaches, no chest pains or shortness of breath. She describes it at nursing home she has been complaining of having lost her vision of her right eye " but it seems that they couldn't find an eye doctor". Patient denies having a primary care Dr. but is in the process of applying for Medicaid the [facility] is listed as her PCP.  The history is provided by the patient.    Past Medical History  Diagnosis Date  . Diabetes mellitus type 2, uncontrolled   . Hypertension   . Anemia   . Alcohol dependency   . Diabetes mellitus     type 2  . Necrotizing fasciitis     Past Surgical History  Procedure Laterality Date  . Amputation      right first and left middle finger amputation 2/2 OM 08/2011 by Dr. Amedeo Plenty  . Cesarean section    . I&d left thumb  12/23/2011  . Tubal ligation    . I&d extremity  12/24/2011    Procedure: IRRIGATION AND DEBRIDEMENT EXTREMITY;  Surgeon: Tennis Must, MD;  Location: [place];  Service: Orthopedics;  Laterality: Left;  I&Dleft thumb with partial amputation  . I&d extremity  12/26/2011    Procedure: IRRIGATION AND DEBRIDEMENT EXTREMITY;  Surgeon: Tennis Must, MD;  Location: [place];  Service: Orthopedics;  Laterality: Left;  . I&d extremity  12/23/2011    Procedure: IRRIGATION AND DEBRIDEMENT EXTREMITY;  Surgeon: Tennis Must, MD;  Location: [place];  Service: Orthopedics;  Laterality:  Left;  I&D Left thumb, hand and arm  . Amputation  01/18/2012    Procedure: AMPUTATION DIGIT;  Surgeon: Tennis Must, MD;  Location: [place];  Service: Orthopedics;  Laterality: Left;  revision amputation left thumb  . Amputation  08/25/2012    Procedure: AMPUTATION BELOW KNEE;  Surgeon: Newt Minion, MD;  Location: [place];  Service: Orthopedics;  Laterality: Right;  Right Below Knee Amputation    Family History  Problem Relation Age of Onset  . Diabetes Mother   . Diabetes Father     History  Substance Use Topics  . Smoking status: Never Smoker   . Smokeless tobacco: Never Used  . Alcohol Use: 0.6 oz/week    1 Cans of beer per week     Comment: h/o alcohol abuse    OB History   Grav Para Term Preterm Abortions TAB SAB Ect Mult Living                  Review of Systems  Constitutional: Negative for fever, chills, diaphoresis, activity change, appetite change and fatigue.  Eyes: Positive for visual disturbance. Negative for photophobia, pain, discharge, redness and itching.  Neurological: Negative for dizziness, syncope, speech difficulty, weakness, numbness and headaches.    Allergies  Review of patient's allergies indicates no known allergies.  Home Medications   Current Outpatient Rx  Name  Route  Sig  Dispense  Refill  . amLODipine (NORVASC) 10 MG tablet   Oral   Take 1 tablet (10 mg total) by mouth daily.         [name] doxycycline (VIBRA-TABS) 100 MG tablet   Oral   Take 1 tablet (100 mg total) by mouth every 12 (twelve) hours. For 2 weeks, then per ID      0   . ferrous sulfate 325 (65 FE) MG tablet   Oral   Take 1 tablet (325 mg total) by mouth 3 (three) times daily with meals.         . folic acid (FOLVITE) 1 MG tablet   Oral   Take 1 tablet (1 mg total) by mouth daily.         [name] gabapentin (NEURONTIN) 300 MG capsule   Oral   Take 1 capsule (300 mg total) by mouth 3 (three) times daily.         [name] HYDROcodone-acetaminophen  (NORCO/VICODIN) 5-325 MG per tablet   Oral   Take 1 tablet by mouth every 4 (four) hours as needed for pain.   20 tablet   0   . insulin aspart (NOVOLOG) 100 UNIT/ML injection   Subcutaneous   Inject 0-10 Units into the skin 3 (three) times daily with meals. Before each meal 3 times a day, 140-199 - 2 units, 200-250 - 4 units, 251-299 - 6 units,  300-349 - 8 units,  350 or above 10 units.   1 vial      . insulin NPH-insulin regular (NOVOLIN 70/30) (70-30) 100 UNIT/ML injection   Subcutaneous   Inject 26 Units into the skin 2 (two) times daily with a meal.   10 mL      . EXPIRED: metFORMIN (GLUCOPHAGE) 1000 MG tablet   Oral   Take 1 tablet (1,000 mg total) by mouth 2 (two) times daily with a meal.   60 tablet   6   . metoprolol tartrate (LOPRESSOR) 12.5 mg TABS   Oral   Take 0.5 tablets (12.5 mg total) by mouth 2 (two) times daily.         [name] thiamine 100 MG tablet   Oral   Take 1 tablet (100 mg total) by mouth daily.           BP 212/104  Pulse 112  Temp(Src) 98.3 F (36.8 C) (Oral)  Resp 19  SpO2 100%  Physical Exam  Nursing note and vitals reviewed. Constitutional: She is oriented to person, place, and time. No distress.  Eyes: Conjunctivae and lids are normal. Pupils are equal, round, and reactive to light. Right eye exhibits no discharge and no exudate. Left eye exhibits no discharge and no exudate. No scleral icterus. Left eye exhibits abnormal extraocular motion. Pupils are equal.    Cardiovascular: Regular rhythm, normal heart sounds and normal pulses.   Neurological: She is alert and oriented to person, place, and time. She has normal strength. She exhibits normal muscle tone.  Patient is able to elevate to move her upper extremities and lower extremities included amputated limb  Skin: No erythema.    ED Course  Procedures (including critical care time)  Labs Reviewed  GLUCOSE, CAPILLARY - Abnormal; Notable for the following:    Glucose-Capillary  177 (*)    All other components within normal limits   No results found.   1. Visual changes   2. Retinal disorder   3. Gaze palsy  MDM  Visual loss OD- with gaze palsy- have discussed case with ophthalmology on call Dr.Tanner- Will evaluate patient tomorrow in his office. At this point although we cannot completely rule out a cerebrovascular accident- syncope subacute.        Rosana Hoes, MD 05/07/13 [id number]

## 2013-05-07 NOTE — ED Notes (Signed)
Pt c/o bilateral visual acuity change onset 1 month Prior to this she wold have blurry vision Describes vision as cloudy... Unable to assess vision screen Hx of DM and HTN She is alert and oriented w/no signs of acute distress.

## 2013-06-06 ENCOUNTER — Ambulatory Visit: Payer: Self-pay | Admitting: Ophthalmology

## 2013-06-06 DIAGNOSIS — I10 Essential (primary) hypertension: Secondary | ICD-10-CM

## 2013-06-06 LAB — CBC WITH DIFFERENTIAL/PLATELET
Basophil #: 0.1 10*3/uL (ref 0.0–0.1)
Basophil %: 1.2 %
Eosinophil #: 0.2 10*3/uL (ref 0.0–0.7)
Eosinophil %: 2.3 %
HCT: 28.8 % — ABNORMAL LOW (ref 35.0–47.0)
HGB: 9.1 g/dL — ABNORMAL LOW (ref 12.0–16.0)
Lymphocyte #: 1.9 10*3/uL (ref 1.0–3.6)
Lymphocyte %: 19.9 %
MCH: 23.1 pg — ABNORMAL LOW (ref 26.0–34.0)
MCHC: 31.6 g/dL — ABNORMAL LOW (ref 32.0–36.0)
MCV: 73 fL — ABNORMAL LOW (ref 80–100)
Monocyte #: 0.5 x10 3/mm (ref 0.2–0.9)
Monocyte %: 5.7 %
Neutrophil #: 6.7 10*3/uL — ABNORMAL HIGH (ref 1.4–6.5)
Neutrophil %: 70.9 %
Platelet: 359 10*3/uL (ref 150–440)
RBC: 3.95 10*6/uL (ref 3.80–5.20)
RDW: 14.5 % (ref 11.5–14.5)
WBC: 9.4 10*3/uL (ref 3.6–11.0)

## 2013-06-06 LAB — BASIC METABOLIC PANEL
Anion Gap: 6 — ABNORMAL LOW (ref 7–16)
BUN: 28 mg/dL — ABNORMAL HIGH (ref 7–18)
Calcium, Total: 8.3 mg/dL — ABNORMAL LOW (ref 8.5–10.1)
Chloride: 113 mmol/L — ABNORMAL HIGH (ref 98–107)
Co2: 23 mmol/L (ref 21–32)
Creatinine: 1.36 mg/dL — ABNORMAL HIGH (ref 0.60–1.30)
EGFR (African American): 54 — ABNORMAL LOW
EGFR (Non-African Amer.): 47 — ABNORMAL LOW
Glucose: 142 mg/dL — ABNORMAL HIGH (ref 65–99)
Osmolality: 291 (ref 275–301)
Potassium: 4.5 mmol/L (ref 3.5–5.1)
Sodium: 142 mmol/L (ref 136–145)

## 2013-06-06 LAB — HCG, QUANTITATIVE, PREGNANCY: Beta Hcg, Quant.: <1 m[IU]/mL — ABNORMAL LOW

## 2013-06-14 ENCOUNTER — Other Ambulatory Visit: Payer: Self-pay

## 2013-07-25 ENCOUNTER — Ambulatory Visit: Payer: Self-pay | Admitting: Ophthalmology

## 2013-09-12 ENCOUNTER — Ambulatory Visit: Payer: Self-pay | Admitting: Ophthalmology

## 2013-09-12 LAB — BASIC METABOLIC PANEL
Anion Gap: 7 (ref 7–16)
BUN: 35 mg/dL — ABNORMAL HIGH (ref 7–18)
Calcium, Total: 7.8 mg/dL — ABNORMAL LOW (ref 8.5–10.1)
Chloride: 111 mmol/L — ABNORMAL HIGH (ref 98–107)
Co2: 21 mmol/L (ref 21–32)
Creatinine: 1.55 mg/dL — ABNORMAL HIGH (ref 0.60–1.30)
EGFR (African American): 46 — ABNORMAL LOW
EGFR (Non-African Amer.): 40 — ABNORMAL LOW
Glucose: 181 mg/dL — ABNORMAL HIGH (ref 65–99)
Osmolality: 290 (ref 275–301)
Potassium: 4.4 mmol/L (ref 3.5–5.1)
Sodium: 139 mmol/L (ref 136–145)

## 2013-09-12 LAB — HEMOGLOBIN: HGB: 9.2 g/dL — ABNORMAL LOW (ref 12.0–16.0)

## 2013-11-07 ENCOUNTER — Ambulatory Visit: Payer: Self-pay | Admitting: Ophthalmology

## 2013-11-07 LAB — HCG, QUANTITATIVE, PREGNANCY: Beta Hcg, Quant.: <1 m[IU]/mL — ABNORMAL LOW

## 2013-11-07 LAB — POTASSIUM: Potassium: 5.2 mmol/L — ABNORMAL HIGH (ref 3.5–5.1)

## 2014-09-03 ENCOUNTER — Ambulatory Visit: Payer: Medicaid Other | Attending: Family Medicine | Admitting: Physical Therapy

## 2014-10-15 DIAGNOSIS — N183 Chronic kidney disease, stage 3 (moderate): Secondary | ICD-10-CM | POA: Diagnosis not present

## 2014-10-15 DIAGNOSIS — I10 Essential (primary) hypertension: Secondary | ICD-10-CM | POA: Diagnosis not present

## 2014-10-15 DIAGNOSIS — Z23 Encounter for immunization: Secondary | ICD-10-CM | POA: Diagnosis not present

## 2014-10-15 DIAGNOSIS — E1122 Type 2 diabetes mellitus with diabetic chronic kidney disease: Secondary | ICD-10-CM | POA: Diagnosis not present

## 2015-02-26 DIAGNOSIS — E1142 Type 2 diabetes mellitus with diabetic polyneuropathy: Secondary | ICD-10-CM | POA: Diagnosis not present

## 2015-02-26 DIAGNOSIS — N183 Chronic kidney disease, stage 3 (moderate): Secondary | ICD-10-CM | POA: Diagnosis not present

## 2015-02-26 DIAGNOSIS — I129 Hypertensive chronic kidney disease with stage 1 through stage 4 chronic kidney disease, or unspecified chronic kidney disease: Secondary | ICD-10-CM | POA: Diagnosis not present

## 2015-03-13 DIAGNOSIS — E11359 Type 2 diabetes mellitus with proliferative diabetic retinopathy without macular edema: Secondary | ICD-10-CM | POA: Diagnosis not present

## 2015-03-13 DIAGNOSIS — H4052X2 Glaucoma secondary to other eye disorders, left eye, moderate stage: Secondary | ICD-10-CM | POA: Diagnosis not present

## 2015-03-21 DIAGNOSIS — H25041 Posterior subcapsular polar age-related cataract, right eye: Secondary | ICD-10-CM | POA: Diagnosis not present

## 2015-03-21 DIAGNOSIS — H2511 Age-related nuclear cataract, right eye: Secondary | ICD-10-CM | POA: Diagnosis not present

## 2015-03-21 DIAGNOSIS — E11359 Type 2 diabetes mellitus with proliferative diabetic retinopathy without macular edema: Secondary | ICD-10-CM | POA: Diagnosis not present

## 2015-03-28 NOTE — Op Note (Signed)
PATIENT NAME:  [name], [name] MR#:  [id number] DATE OF BIRTH:  [birth year]  DATE OF PROCEDURE:  11/07/2013  PROCEDURES PERFORMED:  1.  Pars plana vitrectomy of the left eye.  2.  Tractional retinal detachment repair of the left eye.   PREOPERATIVE DIAGNOSIS: Table top tractional retinal detachment with light perception vision.   POSTOPERATIVE DIAGNOSSIS: Table top tractional retinal detachment with light perception vision.   ESTIMATED BLOOD LOSS: Less than 1 mL.  PRIMARY SURGEON: Coralee Rud, M.D.   ANESTHESIA: General endotracheal anesthesia with supplemental retrobulbar block.   COMPLICATIONS: None.   ESTIMATED BLOOD LOSS: Less than 1 mL.   INDICATIONS FOR PROCEDURE: This is a patient, who presented to my office with loss of vision in her left eye. Examination revealed a substantial severely thickened table top tractional retinal detachment of the left eye. The risks, benefits, and alternatives of the above procedure were discussed and the patient wished to proceed.   DETAILS OF PROCEDURE: After informed consent was obtained, the patient was brought to the operative suite at [facility]. The patient was placed in supine position, was induced by the anesthesia team without complications. A retrobulbar block was performed on the left eye by the primary surgeon without complication. The left eye was prepped and draped in sterile manner. After lid speculum was inserted, a 23-gauge trocar was placed inferotemporally through displaced conjunctiva in an oblique fashion 4 mm beyond the limbus in the inferotemporal quadrant. The infusion cannula was turned on and inserted through the trocar and secured in position with Steri-Strips. Two more trocars were placed in a similar fashion superotemporally and superonasally. The vitreous cutter and light pipe were introduced in the eye and the table detachment was excised away from the anterior vitreous in order to mobilize it.  The peripheral vitreous was then peeled up from the retina without complication and dissected away from the table top proliferative disease. The proliferative plaque was elevated and incised in multiple areas. It became very clear that the underlying posterior pole was heavily incorporated within the plaque. Multiple tears resulted from peeling of the plaque. An extreme amount of retina was noted to be incorporated within the plaque. It was extremely difficult to elevate the plaque. Once finally elevated from the optic nerve leaving a plaque over the macula,  the loosened plaque was removed.  The incorporated plaque was so severely adherent to the retina that the retina tore in multiple areas as the plaque was removed. Once the plaque was removed, it became clear that there was a very large retinal tear left from one fovea to disc distance from the macula to the temporal side with significant bleeding underneath the posterior retina. The posterior retina was extremely disorganized secondary to incorporation within the now relieved plaque. Perfluoron was injected into the eye to flatten the retina and laser was introduced for 360 degrees of the retina leaving only the posterior pole. The Perfluoron was removed during an air-fluid exchange in totality and supplemental laser was introduced. A [id number] centistokes silicone oil was used as an air oil exchange. The oil was brought up to the plane of the lens. The pressure in the eye was approximately 5 to 10 mmHg. The trocars were removed and were noted to be sealed. Dexamethasone 5 mg was given into the inferior fornix and the lid speculum was removed. The eye was cleaned and TobraDex was placed in the eye. A patch and shield were placed over the eye. The patient was  taken to postanesthesia care with instructions to face down.    ____________________________ Teresa Pelton. Starling Manns, MD mfa:aw D: 11/07/2013 10:46:15 ET T: 11/07/2013 10:52:48  ET JOB#: [id number]  cc: Teresa Pelton. Starling Manns, MD, <Dictator> Coralee Rud MD ELECTRONICALLY SIGNED 12/05/2013 7:16

## 2015-03-28 NOTE — Op Note (Signed)
PATIENT NAME:  [name], [name] MR#:  [id number] DATE OF BIRTH:  [birth year]  DATE OF PROCEDURE:  09/12/2013  PROCEDURES PERFORMED:  1.  Pars plana vitrectomy of the right eye.  2.  Panretinal photocoagulation of the right eye.   PREOPERATIVE DIAGNOSES:  1.  Dense vitreous hemorrhage.  2.  Proliferative diabetic retinopathy.   POSTOPERATIVE DIAGNOSES: 1.  Dense vitreous hemorrhage.  2.  Proliferative diabetic retinopathy.   ESTIMATED BLOOD LOSS: Less than 1 mL.  PRIMARY SURGEON: Garlan Fair, M.D.   ANESTHESIA: Retrobulbar block of the right eye with monitored anesthesia care.   COMPLICATIONS: None.   INDICATIONS FOR PROCEDURE: This is a patient who underwent severe tractional retinal detachment repair of the right eye several weeks ago. The patient had a vitreous hemorrhage that was non-clearing for several weeks. Risks, benefits, and alternatives of the above procedure were discussed and the patient wished to proceed.   DETAILS OF PROCEDURE: After informed consent was obtained, the patient was brought to the operative suite at [facility]. The patient was placed in supine position and was given a small dose of Alfenta and a retrobulbar block performed in the right eye by the primary surgeon without any complications. The right eye was prepped and draped in sterile manner. After lid speculum was inserted, a 25-gauge trocar was placed inferotemporally through displaced conjunctiva 4 mm beyond the limbus in an oblique fashion. The infusion cannula was turned on and inserted through the trocar and secured in position with Steri-Strips. Two more trocars were placed in a similar fashion superotemporally and superonasally. The vitreous cutter and light pipe were introduced into the eye and core vitrectomy was performed to remove the bulk of the vitreous hemorrhage. Extreme care was taken to carefully dissect the anterior vitreous base off of the posterior lens in order  to be able to visualize the retina. This was done successfully. Peripheral vitreous was trimmed for 360 degrees. The retina was investigated and was noted to be attached. The laser was introduced and 360 degrees of panretinal photocoagulation was added to augment prior laser and confirm that the retina was attached. A partial air-fluid exchange was performed and the trocars were removed. The wounds were noted to be airtight. Pressure in the eye was confirmed to be approximately 15 mmHg. Dexamethasone  5 mg was given into the inferior fornix and the lid speculum was removed. The eye was cleaned and TobraDex was placed in the eye. A patch and shield were placed over the eye and the patient was taken to postanesthesia care with instructions to remain head up.    ____________________________ Teresa Pelton. Starling Manns, MD mfa:aw D: 09/12/2013 09:16:22 ET T: 09/12/2013 09:40:09 ET JOB#: [id number]  cc: Teresa Pelton. Starling Manns, MD, <Dictator> Coralee Rud MD ELECTRONICALLY SIGNED 10/03/2013 7:08

## 2015-03-28 NOTE — Op Note (Signed)
PATIENT NAME:  [name], [name] MR#:  [id number] DATE OF BIRTH:  1966-11-09  DATE OF PROCEDURE:  07/25/2013  PROCEDURES PERFORMED: 1.  Pars plana vitrectomy of the right eye.  2.  Tractional retinal detachment repair of the right eye.  3.  Panretinal photocoagulation with an indirect on the left eye.   PREOPERATIVE DIAGNOSES: 1.  Proliferative diabetic retinopathy, both eyes.  2.  Tractional retinal detachment, right eye.   PRIMARY SURGEON: Garlan Fair, M.D.   ANESTHESIA: General endotracheal anesthesia with a supplemental retrobulbar block of the right eye.   COMPLICATIONS: None.   ESTIMATED BLOOD LOSS: Less than 1 mL.   INDICATIONS FOR PROCEDURE: The patient presented to my office with a loss of vision in both eyes. Examination revealed proliferative diabetic retinopathy in both eyes with a severe tractional retinal detachment of the right eye and a tractional retinal detachment of the left. Risks, benefits and alternatives of the above procedures were discussed, and the patient wished to proceed.   DETAILS OF PROCEDURE: After informed consent was obtained, the patient was brought into the operative suite at [facility]. The patient was placed in the supine position and was given a small dose of propofol and was induced by the anesthesia team without complication. The right eye was given a retrobulbar block by the primary surgeon without complication.  An indirect was used for visualization of the posterior of the left eye which revealed a severe tractional retinal detachment and peripheral attached retina. Panretinal photocoagulation was performed on the left eye by the primary surgeon for 360 degrees for a total of 1176 pulses. Attention was then turned to the right eye.   The right eye was prepped and draped in sterile manner. After lid speculum was inserted, a 23-gauge trocar was placed inferotemporally through displaced conjunctiva, 4 mm beyond the limbus in  an oblique fashion. Infusion cannula was turned on and inserted through the trocar and secured in position with Steri-Strips. Two more trocars were placed in a similar fashion superotemporally and superonasally. Vitreous cutter and light pipe were introduced in the eye, and the peripheral vitreous was trimmed in order to relieve the posterior from the anterior traction. This was done for 360 degrees, and the posterior severe plaque and tractional retinal detachment was noted to visibly relax posteriorly. A combination of vitrectomy, retinal pick and horizontal scissor was utilized to carefully dissect and peel away the tractional retinal detachment membrane, which covered the entire posterior pole for a total of approximately 20 to 25% of the total retina. Once this was finally released, the retina was carefully investigated. There were no signs of any breaks or tears in any area of the retina whatsoever. Endolaser was introduced and 360 degrees of panretinal photocoagulation was performed. A complete air-fluid exchange was then performed, and the trocars were removed. The wounds were closed using transconjunctival plain 6-0 gut.  Dexamethasone 5 mg was given into the inferior fornix and the lid speculum was removed. The eye was cleaned and TobraDex was placed in the eye. A patch and shield were placed over the eye, and the patient was taken to postanesthesia care with instructions to remain face down.      ____________________________ Teresa Pelton. Starling Manns, MD mfa:dmm D: 07/25/2013 10:18:17 ET T: 07/25/2013 10:52:46 ET JOB#: [id number]  cc: Teresa Pelton. Starling Manns, MD, <Dictator> Coralee Rud MD ELECTRONICALLY SIGNED 07/28/2013 10:50

## 2015-04-01 DIAGNOSIS — H25811 Combined forms of age-related cataract, right eye: Secondary | ICD-10-CM | POA: Diagnosis not present

## 2015-04-01 DIAGNOSIS — H25041 Posterior subcapsular polar age-related cataract, right eye: Secondary | ICD-10-CM | POA: Diagnosis not present

## 2015-04-01 DIAGNOSIS — H2511 Age-related nuclear cataract, right eye: Secondary | ICD-10-CM | POA: Diagnosis not present

## 2015-04-09 DIAGNOSIS — I10 Essential (primary) hypertension: Secondary | ICD-10-CM | POA: Diagnosis not present

## 2015-04-09 DIAGNOSIS — E1142 Type 2 diabetes mellitus with diabetic polyneuropathy: Secondary | ICD-10-CM | POA: Diagnosis not present

## 2015-04-09 DIAGNOSIS — N184 Chronic kidney disease, stage 4 (severe): Secondary | ICD-10-CM | POA: Diagnosis not present

## 2015-05-28 DIAGNOSIS — N184 Chronic kidney disease, stage 4 (severe): Secondary | ICD-10-CM | POA: Diagnosis not present

## 2015-05-28 DIAGNOSIS — R42 Dizziness and giddiness: Secondary | ICD-10-CM | POA: Diagnosis not present

## 2015-05-28 DIAGNOSIS — E1122 Type 2 diabetes mellitus with diabetic chronic kidney disease: Secondary | ICD-10-CM | POA: Diagnosis not present

## 2015-06-03 DIAGNOSIS — E119 Type 2 diabetes mellitus without complications: Secondary | ICD-10-CM | POA: Diagnosis not present

## 2015-06-03 DIAGNOSIS — I10 Essential (primary) hypertension: Secondary | ICD-10-CM | POA: Diagnosis not present

## 2015-06-03 DIAGNOSIS — N184 Chronic kidney disease, stage 4 (severe): Secondary | ICD-10-CM | POA: Diagnosis not present

## 2015-06-04 ENCOUNTER — Encounter (HOSPITAL_COMMUNITY)
Admission: RE | Admit: 2015-06-04 | Discharge: 2015-06-04 | Disposition: A | Discharge status: Home / Self Care | Payer: Medicare Other | Source: Ambulatory Visit | Attending: Nephrology | Admitting: Nephrology

## 2015-06-04 DIAGNOSIS — N184 Chronic kidney disease, stage 4 (severe): Secondary | ICD-10-CM | POA: Insufficient documentation

## 2015-06-04 DIAGNOSIS — D631 Anemia in chronic kidney disease: Secondary | ICD-10-CM | POA: Insufficient documentation

## 2015-06-04 DIAGNOSIS — Z79899 Other long term (current) drug therapy: Secondary | ICD-10-CM | POA: Diagnosis not present

## 2015-06-04 DIAGNOSIS — D509 Iron deficiency anemia, unspecified: Secondary | ICD-10-CM | POA: Insufficient documentation

## 2015-06-04 DIAGNOSIS — Z5181 Encounter for therapeutic drug level monitoring: Secondary | ICD-10-CM | POA: Diagnosis not present

## 2015-06-04 LAB — PREPARE RBC (CROSSMATCH)

## 2015-06-04 LAB — POCT HEMOGLOBIN-HEMACUE: Hemoglobin: 6.6 g/dL — CL (ref 12.0–15.0)

## 2015-06-04 MED ORDER — DARBEPOETIN ALFA 150 MCG/0.3ML IJ SOSY
PREFILLED_SYRINGE | INTRAMUSCULAR | Status: AC
  Administered 2015-06-04: 150 ug
  Filled 2015-06-04: qty 0.3

## 2015-06-05 ENCOUNTER — Ambulatory Visit (HOSPITAL_COMMUNITY)
Admission: RE | Admit: 2015-06-05 | Discharge: 2015-06-05 | Disposition: A | Discharge status: Home / Self Care | Payer: Medicare Other | Source: Ambulatory Visit | Attending: Nephrology | Admitting: Nephrology

## 2015-06-05 DIAGNOSIS — Z79899 Other long term (current) drug therapy: Secondary | ICD-10-CM | POA: Diagnosis not present

## 2015-06-05 DIAGNOSIS — N184 Chronic kidney disease, stage 4 (severe): Secondary | ICD-10-CM | POA: Insufficient documentation

## 2015-06-05 DIAGNOSIS — D631 Anemia in chronic kidney disease: Secondary | ICD-10-CM | POA: Diagnosis not present

## 2015-06-05 DIAGNOSIS — D509 Iron deficiency anemia, unspecified: Secondary | ICD-10-CM | POA: Diagnosis not present

## 2015-06-05 DIAGNOSIS — Z5181 Encounter for therapeutic drug level monitoring: Secondary | ICD-10-CM | POA: Diagnosis not present

## 2015-06-05 MED ORDER — SODIUM CHLORIDE 0.9 % IV SOLN
Freq: Once | INTRAVENOUS | Status: DC
Start: 2015-06-05 — End: 2015-06-06

## 2015-06-05 MED ORDER — DIPHENHYDRAMINE HCL 25 MG PO CAPS
ORAL_CAPSULE | ORAL | Status: AC
  Filled 2015-06-05: qty 1

## 2015-06-05 MED ORDER — DIPHENHYDRAMINE HCL 25 MG PO CAPS
25.0000 mg | ORAL_CAPSULE | Freq: Once | ORAL | Status: AC
  Administered 2015-06-05: 25 mg via ORAL

## 2015-06-05 MED ORDER — ACETAMINOPHEN 325 MG PO TABS
650.0000 mg | ORAL_TABLET | Freq: Once | ORAL | Status: AC
  Administered 2015-06-05: 650 mg via ORAL

## 2015-06-05 MED ORDER — ACETAMINOPHEN 325 MG PO TABS
ORAL_TABLET | ORAL | Status: AC
  Filled 2015-06-05: qty 2

## 2015-06-06 LAB — TYPE AND SCREEN
ABO/RH(D): O NEG
Antibody Screen: NEGATIVE
Unit division: 0
Unit division: 0

## 2015-06-11 ENCOUNTER — Ambulatory Visit: Payer: Medicaid Other | Attending: Ophthalmology | Admitting: Occupational Therapy

## 2015-06-21 DIAGNOSIS — E1122 Type 2 diabetes mellitus with diabetic chronic kidney disease: Secondary | ICD-10-CM | POA: Diagnosis not present

## 2015-06-21 DIAGNOSIS — I10 Essential (primary) hypertension: Secondary | ICD-10-CM | POA: Diagnosis not present

## 2015-06-26 DIAGNOSIS — I10 Essential (primary) hypertension: Secondary | ICD-10-CM | POA: Diagnosis not present

## 2015-06-26 DIAGNOSIS — E118 Type 2 diabetes mellitus with unspecified complications: Secondary | ICD-10-CM | POA: Diagnosis not present

## 2015-06-26 DIAGNOSIS — D649 Anemia, unspecified: Secondary | ICD-10-CM | POA: Diagnosis not present

## 2015-07-02 ENCOUNTER — Ambulatory Visit (HOSPITAL_COMMUNITY): Admission: RE | Admit: 2015-07-02 | Payer: Medicare Other | Source: Ambulatory Visit

## 2015-07-04 ENCOUNTER — Encounter (HOSPITAL_COMMUNITY)
Admission: RE | Admit: 2015-07-04 | Discharge: 2015-07-04 | Disposition: A | Discharge status: Home / Self Care | Payer: Medicare Other | Source: Ambulatory Visit | Attending: Nephrology | Admitting: Nephrology

## 2015-07-04 DIAGNOSIS — Z79899 Other long term (current) drug therapy: Secondary | ICD-10-CM | POA: Insufficient documentation

## 2015-07-04 DIAGNOSIS — Z5181 Encounter for therapeutic drug level monitoring: Secondary | ICD-10-CM | POA: Diagnosis not present

## 2015-07-04 DIAGNOSIS — N184 Chronic kidney disease, stage 4 (severe): Secondary | ICD-10-CM | POA: Insufficient documentation

## 2015-07-04 DIAGNOSIS — D631 Anemia in chronic kidney disease: Secondary | ICD-10-CM | POA: Insufficient documentation

## 2015-07-04 DIAGNOSIS — D509 Iron deficiency anemia, unspecified: Secondary | ICD-10-CM | POA: Diagnosis not present

## 2015-07-04 LAB — POCT HEMOGLOBIN-HEMACUE: Hemoglobin: 9.3 g/dL — ABNORMAL LOW (ref 12.0–15.0)

## 2015-07-04 MED ORDER — DARBEPOETIN ALFA 150 MCG/0.3ML IJ SOSY
PREFILLED_SYRINGE | INTRAMUSCULAR | Status: DC
Start: 2015-07-04 — End: 2015-07-05
  Filled 2015-07-04: qty 0.3

## 2015-07-04 MED ORDER — DARBEPOETIN ALFA 150 MCG/0.3ML IJ SOSY
150.0000 ug | PREFILLED_SYRINGE | INTRAMUSCULAR | Status: DC
  Administered 2015-07-04: 150 ug via SUBCUTANEOUS

## 2015-07-07 DIAGNOSIS — D631 Anemia in chronic kidney disease: Secondary | ICD-10-CM | POA: Diagnosis not present

## 2015-07-07 DIAGNOSIS — N2581 Secondary hyperparathyroidism of renal origin: Secondary | ICD-10-CM | POA: Diagnosis not present

## 2015-07-07 DIAGNOSIS — I10 Essential (primary) hypertension: Secondary | ICD-10-CM | POA: Diagnosis not present

## 2015-07-07 DIAGNOSIS — N184 Chronic kidney disease, stage 4 (severe): Secondary | ICD-10-CM | POA: Diagnosis not present

## 2015-07-13 ENCOUNTER — Encounter (HOSPITAL_COMMUNITY): Payer: Self-pay | Admitting: Emergency Medicine

## 2015-07-13 ENCOUNTER — Inpatient Hospital Stay (HOSPITAL_COMMUNITY)
Admission: EM | Admit: 2015-07-13 | Discharge: 2015-07-16 | DRG: 683 | Disposition: A | Discharge status: Home / Self Care | Payer: Medicare Other | Attending: Internal Medicine | Admitting: Internal Medicine

## 2015-07-13 DIAGNOSIS — D72829 Elevated white blood cell count, unspecified: Secondary | ICD-10-CM | POA: Diagnosis present

## 2015-07-13 DIAGNOSIS — D509 Iron deficiency anemia, unspecified: Secondary | ICD-10-CM | POA: Diagnosis present

## 2015-07-13 DIAGNOSIS — E11319 Type 2 diabetes mellitus with unspecified diabetic retinopathy without macular edema: Secondary | ICD-10-CM | POA: Diagnosis present

## 2015-07-13 DIAGNOSIS — E1165 Type 2 diabetes mellitus with hyperglycemia: Secondary | ICD-10-CM | POA: Diagnosis present

## 2015-07-13 DIAGNOSIS — Z794 Long term (current) use of insulin: Secondary | ICD-10-CM

## 2015-07-13 DIAGNOSIS — N184 Chronic kidney disease, stage 4 (severe): Secondary | ICD-10-CM | POA: Diagnosis not present

## 2015-07-13 DIAGNOSIS — Z89511 Acquired absence of right leg below knee: Secondary | ICD-10-CM

## 2015-07-13 DIAGNOSIS — I129 Hypertensive chronic kidney disease with stage 1 through stage 4 chronic kidney disease, or unspecified chronic kidney disease: Secondary | ICD-10-CM | POA: Diagnosis present

## 2015-07-13 DIAGNOSIS — N179 Acute kidney failure, unspecified: Principal | ICD-10-CM | POA: Diagnosis present

## 2015-07-13 DIAGNOSIS — D631 Anemia in chronic kidney disease: Secondary | ICD-10-CM | POA: Diagnosis present

## 2015-07-13 DIAGNOSIS — E860 Dehydration: Secondary | ICD-10-CM | POA: Diagnosis present

## 2015-07-13 DIAGNOSIS — F1010 Alcohol abuse, uncomplicated: Secondary | ICD-10-CM | POA: Diagnosis present

## 2015-07-13 DIAGNOSIS — I739 Peripheral vascular disease, unspecified: Secondary | ICD-10-CM | POA: Diagnosis present

## 2015-07-13 DIAGNOSIS — E1121 Type 2 diabetes mellitus with diabetic nephropathy: Secondary | ICD-10-CM | POA: Diagnosis present

## 2015-07-13 DIAGNOSIS — E119 Type 2 diabetes mellitus without complications: Secondary | ICD-10-CM | POA: Diagnosis present

## 2015-07-13 DIAGNOSIS — E872 Acidosis: Secondary | ICD-10-CM | POA: Diagnosis present

## 2015-07-13 DIAGNOSIS — E875 Hyperkalemia: Secondary | ICD-10-CM | POA: Diagnosis not present

## 2015-07-13 DIAGNOSIS — N189 Chronic kidney disease, unspecified: Secondary | ICD-10-CM | POA: Diagnosis not present

## 2015-07-13 DIAGNOSIS — H5442 Blindness, left eye, normal vision right eye: Secondary | ICD-10-CM | POA: Diagnosis present

## 2015-07-13 DIAGNOSIS — N2581 Secondary hyperparathyroidism of renal origin: Secondary | ICD-10-CM | POA: Diagnosis present

## 2015-07-13 DIAGNOSIS — E876 Hypokalemia: Secondary | ICD-10-CM | POA: Diagnosis present

## 2015-07-13 LAB — URINE MICROSCOPIC-ADD ON

## 2015-07-13 LAB — RAPID URINE DRUG SCREEN, HOSP PERFORMED
Amphetamines: NOT DETECTED
Barbiturates: NOT DETECTED
Benzodiazepines: NOT DETECTED
Cocaine: NOT DETECTED
Opiates: NOT DETECTED
Tetrahydrocannabinol: NOT DETECTED

## 2015-07-13 LAB — CBC WITH DIFFERENTIAL/PLATELET
Basophils Absolute: 0.1 10*3/uL (ref 0.0–0.1)
Basophils Relative: 1 % (ref 0–1)
Eosinophils Absolute: 0.5 10*3/uL (ref 0.0–0.7)
Eosinophils Relative: 4 % (ref 0–5)
HCT: 29.7 % — ABNORMAL LOW (ref 36.0–46.0)
Hemoglobin: 9.2 g/dL — ABNORMAL LOW (ref 12.0–15.0)
Lymphocytes Relative: 17 % (ref 12–46)
Lymphs Abs: 2 10*3/uL (ref 0.7–4.0)
MCH: 22.5 pg — ABNORMAL LOW (ref 26.0–34.0)
MCHC: 31 g/dL (ref 30.0–36.0)
MCV: 72.6 fL — ABNORMAL LOW (ref 78.0–100.0)
Monocytes Absolute: 0.5 10*3/uL (ref 0.1–1.0)
Monocytes Relative: 4 % (ref 3–12)
Neutro Abs: 9 10*3/uL — ABNORMAL HIGH (ref 1.7–7.7)
Neutrophils Relative %: 74 % (ref 43–77)
Platelets: 368 10*3/uL (ref 150–400)
RBC: 4.09 MIL/uL (ref 3.87–5.11)
RDW: 17.5 % — ABNORMAL HIGH (ref 11.5–15.5)
WBC: 12 10*3/uL — ABNORMAL HIGH (ref 4.0–10.5)

## 2015-07-13 LAB — URINALYSIS, ROUTINE W REFLEX MICROSCOPIC
Bilirubin Urine: NEGATIVE
Glucose, UA: 250 mg/dL — AB
Ketones, ur: NEGATIVE mg/dL
Leukocytes, UA: NEGATIVE
Nitrite: NEGATIVE
Protein, ur: >300 mg/dL — AB
Specific Gravity, Urine: 1.012 (ref 1.005–1.030)
Urobilinogen, UA: 0.2 mg/dL (ref 0.0–1.0)
pH: 6.5 (ref 5.0–8.0)

## 2015-07-13 LAB — BASIC METABOLIC PANEL
Anion gap: 10 (ref 5–15)
BUN: 59 mg/dL — ABNORMAL HIGH (ref 6–20)
CO2: 17 mmol/L — ABNORMAL LOW (ref 22–32)
Calcium: 8.2 mg/dL — ABNORMAL LOW (ref 8.9–10.3)
Chloride: 111 mmol/L (ref 101–111)
Creatinine, Ser: 4.65 mg/dL — ABNORMAL HIGH (ref 0.44–1.00)
GFR calc Af Amer: 12 mL/min — ABNORMAL LOW (ref >60)
GFR calc non Af Amer: 10 mL/min — ABNORMAL LOW (ref >60)
Glucose, Bld: 182 mg/dL — ABNORMAL HIGH (ref 65–99)
Potassium: 5.4 mmol/L — ABNORMAL HIGH (ref 3.5–5.1)
Sodium: 138 mmol/L (ref 135–145)

## 2015-07-13 LAB — GLUCOSE, CAPILLARY
Glucose-Capillary: 148 mg/dL — ABNORMAL HIGH (ref 65–99)
Glucose-Capillary: 198 mg/dL — ABNORMAL HIGH (ref 65–99)

## 2015-07-13 LAB — PHOSPHORUS: Phosphorus: 5.8 mg/dL — ABNORMAL HIGH (ref 2.5–4.6)

## 2015-07-13 MED ORDER — LORAZEPAM 1 MG PO TABS
1.0000 mg | ORAL_TABLET | Freq: Four times a day (QID) | ORAL | Status: DC | PRN
  Administered 2015-07-13: 1 mg via ORAL
  Filled 2015-07-13: qty 1

## 2015-07-13 MED ORDER — DOCUSATE SODIUM 100 MG PO CAPS
100.0000 mg | ORAL_CAPSULE | Freq: Two times a day (BID) | ORAL | Status: DC
  Administered 2015-07-13 – 2015-07-15 (×4): 100 mg via ORAL
  Filled 2015-07-13 (×5): qty 1

## 2015-07-13 MED ORDER — VITAMIN B-1 100 MG PO TABS
100.0000 mg | ORAL_TABLET | Freq: Every day | ORAL | Status: DC
  Administered 2015-07-13 – 2015-07-15 (×3): 100 mg via ORAL
  Filled 2015-07-13 (×3): qty 1

## 2015-07-13 MED ORDER — ACETAMINOPHEN 650 MG RE SUPP: 650.0000 mg | Freq: Four times a day (QID) | RECTAL | Status: DC | PRN

## 2015-07-13 MED ORDER — LORAZEPAM 2 MG/ML IJ SOLN: 0.0000 mg | Freq: Two times a day (BID) | INTRAMUSCULAR | Status: DC

## 2015-07-13 MED ORDER — ONDANSETRON HCL 4 MG PO TABS: 4.0000 mg | ORAL_TABLET | Freq: Four times a day (QID) | ORAL | Status: DC | PRN

## 2015-07-13 MED ORDER — SODIUM BICARBONATE 650 MG PO TABS
1300.0000 mg | ORAL_TABLET | Freq: Two times a day (BID) | ORAL | Status: DC
  Administered 2015-07-13 – 2015-07-15 (×5): 1300 mg via ORAL
  Filled 2015-07-13 (×5): qty 2

## 2015-07-13 MED ORDER — INSULIN DETEMIR 100 UNIT/ML ~~LOC~~ SOLN
10.0000 [IU] | Freq: Every day | SUBCUTANEOUS | Status: DC
  Administered 2015-07-13 – 2015-07-15 (×3): 10 [IU] via SUBCUTANEOUS
  Filled 2015-07-13 (×4): qty 0.1

## 2015-07-13 MED ORDER — SODIUM CHLORIDE 0.9 % IV SOLN
INTRAVENOUS | Status: AC
  Administered 2015-07-13: 18:00:00 via INTRAVENOUS

## 2015-07-13 MED ORDER — PNEUMOCOCCAL VAC POLYVALENT 25 MCG/0.5ML IJ INJ
0.5000 mL | INJECTION | INTRAMUSCULAR | Status: AC
  Administered 2015-07-14: 0.5 mL via INTRAMUSCULAR
  Filled 2015-07-13: qty 0.5

## 2015-07-13 MED ORDER — HEPARIN SODIUM (PORCINE) 5000 UNIT/ML IJ SOLN
5000.0000 [IU] | Freq: Three times a day (TID) | INTRAMUSCULAR | Status: DC
  Administered 2015-07-13 – 2015-07-16 (×8): 5000 [IU] via SUBCUTANEOUS
  Filled 2015-07-13 (×7): qty 1

## 2015-07-13 MED ORDER — ONDANSETRON HCL 4 MG/2ML IJ SOLN: 4.0000 mg | Freq: Four times a day (QID) | INTRAMUSCULAR | Status: DC | PRN

## 2015-07-13 MED ORDER — CALCITRIOL 0.25 MCG PO CAPS
0.2500 ug | ORAL_CAPSULE | Freq: Every day | ORAL | Status: DC
  Administered 2015-07-14 – 2015-07-15 (×2): 0.25 ug via ORAL
  Filled 2015-07-13 (×2): qty 1

## 2015-07-13 MED ORDER — RENA-VITE PO TABS
1.0000 | ORAL_TABLET | Freq: Every day | ORAL | Status: DC
  Administered 2015-07-13 – 2015-07-15 (×3): 1 via ORAL
  Filled 2015-07-13 (×3): qty 1

## 2015-07-13 MED ORDER — CARVEDILOL 6.25 MG PO TABS
6.2500 mg | ORAL_TABLET | Freq: Two times a day (BID) | ORAL | Status: DC
  Administered 2015-07-13 – 2015-07-16 (×6): 6.25 mg via ORAL
  Filled 2015-07-13 (×6): qty 1

## 2015-07-13 MED ORDER — INSULIN ASPART 100 UNIT/ML ~~LOC~~ SOLN
0.0000 [IU] | Freq: Three times a day (TID) | SUBCUTANEOUS | Status: DC
  Administered 2015-07-14 – 2015-07-15 (×5): 1 [IU] via SUBCUTANEOUS
  Administered 2015-07-15 – 2015-07-16 (×2): 2 [IU] via SUBCUTANEOUS

## 2015-07-13 MED ORDER — GABAPENTIN 300 MG PO CAPS
300.0000 mg | ORAL_CAPSULE | Freq: Every day | ORAL | Status: DC
  Administered 2015-07-13 – 2015-07-15 (×3): 300 mg via ORAL
  Filled 2015-07-13 (×3): qty 1

## 2015-07-13 MED ORDER — ATORVASTATIN CALCIUM 20 MG PO TABS
20.0000 mg | ORAL_TABLET | Freq: Every day | ORAL | Status: DC
  Administered 2015-07-13 – 2015-07-15 (×3): 20 mg via ORAL
  Filled 2015-07-13 (×3): qty 1

## 2015-07-13 MED ORDER — SENNOSIDES-DOCUSATE SODIUM 8.6-50 MG PO TABS: 1.0000 | ORAL_TABLET | Freq: Every evening | ORAL | Status: DC | PRN

## 2015-07-13 MED ORDER — ACETAMINOPHEN 325 MG PO TABS
650.0000 mg | ORAL_TABLET | Freq: Four times a day (QID) | ORAL | Status: DC | PRN
Start: 2015-07-13 — End: 2015-07-16

## 2015-07-13 MED ORDER — AMLODIPINE BESYLATE 10 MG PO TABS
10.0000 mg | ORAL_TABLET | Freq: Every day | ORAL | Status: DC
  Administered 2015-07-14 – 2015-07-15 (×2): 10 mg via ORAL
  Filled 2015-07-13 (×2): qty 1

## 2015-07-13 MED ORDER — THIAMINE HCL 100 MG/ML IJ SOLN: 100.0000 mg | Freq: Every day | INTRAMUSCULAR | Status: DC

## 2015-07-13 MED ORDER — FOLIC ACID 1 MG PO TABS
1.0000 mg | ORAL_TABLET | Freq: Every day | ORAL | Status: DC
  Administered 2015-07-13 – 2015-07-15 (×3): 1 mg via ORAL
  Filled 2015-07-13 (×3): qty 1

## 2015-07-13 MED ORDER — LORAZEPAM 2 MG/ML IJ SOLN
0.0000 mg | Freq: Four times a day (QID) | INTRAMUSCULAR | Status: AC
Start: 2015-07-13 — End: 2015-07-15

## 2015-07-13 MED ORDER — SODIUM CHLORIDE 0.9 % IV BOLUS (SEPSIS)
1000.0000 mL | Freq: Once | INTRAVENOUS | Status: AC
  Administered 2015-07-13: 1000 mL via INTRAVENOUS

## 2015-07-13 MED ORDER — LORAZEPAM 2 MG/ML IJ SOLN: 1.0000 mg | Freq: Four times a day (QID) | INTRAMUSCULAR | Status: DC | PRN

## 2015-07-13 NOTE — ED Notes (Signed)
Pt taking antivert for vertigo x1-1.17mo.  Reports seeing "floaters" in front of her eyes x1.45mo.  Pt reports had been seeing the floaters before she started taking antivert.  Pt reports no vision in left eye x22yr and had cataract surgery approx 71mo to right eye and never regained full visual acuity to thee right eye. Pt is alert and oriented x4 and in NAD.

## 2015-07-13 NOTE — ED Provider Notes (Signed)
CSN: [id number]     Arrival date & time 07/13/15  1127 History   First MD Initiated Contact with Patient 07/13/15 1219     No chief complaint on file.    (Consider location/radiation/quality/duration/timing/severity/associated sxs/prior Treatment) HPI Comments: Patient presents today with a chief complaint of visual hallucinations.  She states that she has been seeing centipedes and also fish swimming in water for the past 35months.  She states that she was started  Meclizine for Vertigo 1.5 months ago.  She states that the visual hallucinations actually started prior to starting Meclizine.  She reports that her vertigo has improved since starting the Meclizine.  She denies any acute head injury or trauma.  No other new medications.  Denies recent alcohol or recreational drug use.  Denies any other new vision changes.  She does report difficulty seeing out of her left eye due to a cataract that has been present for the past year.  She denies headache, nausea, vomiting, fever, chills, or urinary symptoms.  Denies any history of visual or auditory hallucinations.  No urinary symptoms.  The history is provided by the patient.    Past Medical History  Diagnosis Date  . Diabetes mellitus type 2, uncontrolled   . Hypertension   . Anemia   . Alcohol dependency   . Diabetes mellitus     type 2  . Necrotizing fasciitis    Past Surgical History  Procedure Laterality Date  . Amputation      right first and left middle finger amputation 2/2 OM 08/2011 by Dr. Amedeo Plenty  . Cesarean section    . I&d left thumb  12/23/2011  . Tubal ligation    . I&d extremity  12/24/2011    Procedure: IRRIGATION AND DEBRIDEMENT EXTREMITY;  Surgeon: Tennis Must, MD;  Location: [place];  Service: Orthopedics;  Laterality: Left;  I&Dleft thumb with partial amputation  . I&d extremity  12/26/2011    Procedure: IRRIGATION AND DEBRIDEMENT EXTREMITY;  Surgeon: Tennis Must, MD;  Location: [place];  Service: Orthopedics;   Laterality: Left;  . I&d extremity  12/23/2011    Procedure: IRRIGATION AND DEBRIDEMENT EXTREMITY;  Surgeon: Tennis Must, MD;  Location: [place];  Service: Orthopedics;  Laterality: Left;  I&D Left thumb, hand and arm  . Amputation  01/18/2012    Procedure: AMPUTATION DIGIT;  Surgeon: Tennis Must, MD;  Location: [place];  Service: Orthopedics;  Laterality: Left;  revision amputation left thumb  . Amputation  08/25/2012    Procedure: AMPUTATION BELOW KNEE;  Surgeon: Newt Minion, MD;  Location: [place];  Service: Orthopedics;  Laterality: Right;  Right Below Knee Amputation   Family History  Problem Relation Age of Onset  . Diabetes Mother   . Diabetes Father    History  Substance Use Topics  . Smoking status: Never Smoker   . Smokeless tobacco: Never Used  . Alcohol Use: 0.6 oz/week    1 Cans of beer per week     Comment: h/o alcohol abuse   OB History    No data available     Review of Systems  All other systems reviewed and are negative.     Allergies  Review of patient's allergies indicates no known allergies.  Home Medications   Prior to Admission medications   Medication Sig Start Date End Date Taking? Authorizing Provider  amLODipine (NORVASC) 10 MG tablet Take 1 tablet (10 mg total) by mouth daily. 08/30/12   Prashant  Jennette Kettle, MD  doxycycline (VIBRA-TABS) 100 MG tablet Take 1 tablet (100 mg total) by mouth every 12 (twelve) hours. For 2 weeks, then per ID 08/30/12   Thurnell Lose, MD  ferrous sulfate 325 (65 FE) MG tablet Take 1 tablet (325 mg total) by mouth 3 (three) times daily with meals. 08/30/12   Thurnell Lose, MD  folic acid (FOLVITE) 1 MG tablet Take 1 tablet (1 mg total) by mouth daily. 08/30/12   Thurnell Lose, MD  gabapentin (NEURONTIN) 300 MG capsule Take 1 capsule (300 mg total) by mouth 3 (three) times daily. 08/30/12   Thurnell Lose, MD  HYDROcodone-acetaminophen (NORCO/VICODIN) 5-325 MG per tablet Take 1 tablet by mouth every 4  (four) hours as needed for pain. 08/30/12   Thurnell Lose, MD  insulin aspart (NOVOLOG) 100 UNIT/ML injection Inject 0-10 Units into the skin 3 (three) times daily with meals. Before each meal 3 times a day, 140-199 - 2 units, 200-250 - 4 units, 251-299 - 6 units,  300-349 - 8 units,  350 or above 10 units. 08/30/12   Thurnell Lose, MD  insulin NPH-insulin regular (NOVOLIN 70/30) (70-30) 100 UNIT/ML injection Inject 26 Units into the skin 2 (two) times daily with a meal. 08/30/12   Thurnell Lose, MD  metFORMIN (GLUCOPHAGE) 1000 MG tablet Take 1 tablet (1,000 mg total) by mouth 2 (two) times daily with a meal. 12/29/11 12/28/12  Ivor Costa, MD  metoprolol tartrate (LOPRESSOR) 12.5 mg TABS Take 0.5 tablets (12.5 mg total) by mouth 2 (two) times daily. 08/30/12   Thurnell Lose, MD  thiamine 100 MG tablet Take 1 tablet (100 mg total) by mouth daily. 08/30/12   Thurnell Lose, MD   BP 173/89 mmHg  Pulse 109  Temp(Src) 98.3 F (36.8 C) (Oral)  Resp 16  Ht 5\' 5"  (1.651 m)  Wt 188 lb (85.276 kg)  BMI 31.28 kg/m2  SpO2 100%  LMP 07/04/2015 Physical Exam  Constitutional: She appears well-developed and well-nourished.  HENT:  Head: Normocephalic and atraumatic.  Eyes:  Cataract of the left eye  Neck: Normal range of motion. Neck supple.  Cardiovascular: Normal rate, regular rhythm and normal heart sounds.   Pulmonary/Chest: Effort normal and breath sounds normal.  Abdominal: Soft. There is no tenderness.  Musculoskeletal: Normal range of motion.  Right BKA  Neurological: She is alert. She has normal strength. No cranial nerve deficit or sensory deficit. Coordination and gait normal.  Skin: Skin is warm and dry.  Psychiatric: She has a normal mood and affect.  Nursing note and vitals reviewed.   ED Course  Procedures (including critical care time) Labs Review Labs Reviewed - No data to display  Imaging Review No results found.   EKG Interpretation None     3:14 PM Discussed  patient's labs with Dr Berdine Addison her PCP.  He reports that the patient's Creatine was 3.07 and BUN of 40 and GFR was 20 on 02/26/15.   MDM   Final diagnoses:  None   Patient presents today with a chief complaint of visual hallucinations x 2 months.  No prior psychiatric history.  She was recently started on Meclizine, but states that symptoms started before starting the medication.  CBC and BMP ordered.  Patient found to have a Creatine of 4.65.  Consulted Dr. Berdine Addison the patient's PCP and he states that her last Creatine was 3.06 in the office on 02-26-15.  He referred her to Nephrology.  Patient  reports that she saw Dr. Carmina Miller with Nephrology, but is currently not on dialysis.  Hallucinations may be related to Uremia.  Patient given IVF in the ED.  Bladder scan performed due to the fact that Meclizine can potentially cause urinary retention.  Bladder scan showed 96 cc.  Patient discussed with Dr. Sheran Fava with [facility] who has agreed to admit the patient.    Hyman Bible, PA-C 07/13/15 [place], MD 07/13/15 [phone]

## 2015-07-13 NOTE — ED Notes (Signed)
Pt. Stated, I've been taking pills for dizziness and Im seeing bugs and all kinds of stuff for the last month.

## 2015-07-13 NOTE — Consult Note (Signed)
Reason for Consult: Acute renal failure on chronic kidney disease stage IV Referring Physician: Janece Canterbury M.D. [facility])  HPI:  49 year old African American woman with past medical history significant for poorly controlled hypertension, type 2 diabetes mellitus with consequent retinopathy (blind in left eye), neuropathy and progressive chronic kidney disease stage IV. She was seen last week by Dr. Joelyn Oms and noted to have an elevation of her creatinine from 3.6 in June up to 4.2-referred for dialysis access placement and medications adjusted with up titration of diuretic therapy (furosemide 40 mg daily to 40 mg twice a day that she did not start until yesterday).  She presented to the emergency room having been referred for worsening vision changes from her right eye-seeing blurred images resembling fish and caterpillars that on description appear to be most consistent with floaters. She has also been dealing with vertigo and continues to have a sensation of the room spinning around her in spite of taking meclizine.  She denies any nausea, vomiting or diarrhea. She denies any dysgeusia. She denies any shortness of breath or chest pain. She denies any changes in mentation or unusual jerky movements of the limbs. She denies taking any NSAIDs and has remained off of her ARB.   Nephrology is consulted for further input/management of her rising creatinine (now 4.6), hyperkalemia and metabolic acidosis.  Past Medical History  Diagnosis Date  . Diabetes mellitus type 2, uncontrolled   . Hypertension   . Anemia   . Alcohol dependency   . Diabetes mellitus     type 2  . Necrotizing fasciitis     Past Surgical History  Procedure Laterality Date  . Amputation      right first and left middle finger amputation 2/2 OM 08/2011 by Dr. Amedeo Plenty  . Cesarean section    . I&d left thumb  12/23/2011  . Tubal ligation    . I&d extremity  12/24/2011    Procedure: IRRIGATION AND DEBRIDEMENT EXTREMITY;   Surgeon: Tennis Must, MD;  Location: [place];  Service: Orthopedics;  Laterality: Left;  I&Dleft thumb with partial amputation  . I&d extremity  12/26/2011    Procedure: IRRIGATION AND DEBRIDEMENT EXTREMITY;  Surgeon: Tennis Must, MD;  Location: [place];  Service: Orthopedics;  Laterality: Left;  . I&d extremity  12/23/2011    Procedure: IRRIGATION AND DEBRIDEMENT EXTREMITY;  Surgeon: Tennis Must, MD;  Location: [place];  Service: Orthopedics;  Laterality: Left;  I&D Left thumb, hand and arm  . Amputation  01/18/2012    Procedure: AMPUTATION DIGIT;  Surgeon: Tennis Must, MD;  Location: [place];  Service: Orthopedics;  Laterality: Left;  revision amputation left thumb  . Amputation  08/25/2012    Procedure: AMPUTATION BELOW KNEE;  Surgeon: Newt Minion, MD;  Location: [place];  Service: Orthopedics;  Laterality: Right;  Right Below Knee Amputation    Family History  Problem Relation Age of Onset  . Diabetes Mother   . Diabetes Father   . Kidney failure Sister   . Diabetes Sister   . High blood pressure Sister     Social History:  reports that she has never smoked. She has never used smokeless tobacco. She reports that she drinks about 0.6 oz of alcohol per week. She reports that she does not use illicit drugs.  Allergies: No Known Allergies  Medications:  Scheduled: . [START ON 07/14/2015] amLODipine  10 mg Oral Daily  . atorvastatin  20 mg Oral Daily  . [  START ON 07/14/2015] calcitRIOL  0.25 mcg Oral Daily  . carvedilol  6.25 mg Oral BID WC  . docusate sodium  100 mg Oral BID  . folic acid  1 mg Oral Daily  . gabapentin  300 mg Oral QHS  . heparin  5,000 Units Subcutaneous 3 times per day  . [START ON 07/14/2015] insulin aspart  0-9 Units Subcutaneous TID WC  . insulin detemir  10 Units Subcutaneous QHS  . LORazepam  0-4 mg Intravenous Q6H   Followed by  . [START ON 07/15/2015] LORazepam  0-4 mg Intravenous Q12H  . multivitamin  1 tablet Oral QHS  . [START ON 07/14/2015]  pneumococcal 23 valent vaccine  0.5 mL Intramuscular Tomorrow-1000  . sodium bicarbonate  1,300 mg Oral BID  . thiamine  100 mg Oral Daily   Or  . thiamine  100 mg Intravenous Daily    Results for orders placed or performed during the hospital encounter of 07/13/15 (from the past 48 hour(s))  CBC with Differential/Platelet     Status: Abnormal   Collection Time: 07/13/15  1:25 PM  Result Value Ref Range   WBC 12.0 (H) 4.0 - 10.5 K/uL   RBC 4.09 3.87 - 5.11 MIL/uL   Hemoglobin 9.2 (L) 12.0 - 15.0 g/dL   HCT 29.7 (L) 36.0 - 46.0 %   MCV 72.6 (L) 78.0 - 100.0 fL   MCH 22.5 (L) 26.0 - 34.0 pg   MCHC 31.0 30.0 - 36.0 g/dL   RDW 17.5 (H) 11.5 - 15.5 %   Platelets 368 150 - 400 K/uL   Neutrophils Relative % 74 43 - 77 %   Neutro Abs 9.0 (H) 1.7 - 7.7 K/uL   Lymphocytes Relative 17 12 - 46 %   Lymphs Abs 2.0 0.7 - 4.0 K/uL   Monocytes Relative 4 3 - 12 %   Monocytes Absolute 0.5 0.1 - 1.0 K/uL   Eosinophils Relative 4 0 - 5 %   Eosinophils Absolute 0.5 0.0 - 0.7 K/uL   Basophils Relative 1 0 - 1 %   Basophils Absolute 0.1 0.0 - 0.1 K/uL  Basic metabolic panel     Status: Abnormal   Collection Time: 07/13/15  1:25 PM  Result Value Ref Range   Sodium 138 135 - 145 mmol/L   Potassium 5.4 (H) 3.5 - 5.1 mmol/L   Chloride 111 101 - 111 mmol/L   CO2 17 (L) 22 - 32 mmol/L   Glucose, Bld 182 (H) 65 - 99 mg/dL   BUN 59 (H) 6 - 20 mg/dL   Creatinine, Ser 4.65 (H) 0.44 - 1.00 mg/dL   Calcium 8.2 (L) 8.9 - 10.3 mg/dL   GFR calc non Af Amer 10 (L) >60 mL/min   GFR calc Af Amer 12 (L) >60 mL/min    Comment: (NOTE) The eGFR has been calculated using the CKD EPI equation. This calculation has not been validated in all clinical situations. eGFR's persistently <60 mL/min signify possible Chronic Kidney Disease.    Anion gap 10 5 - 15  Urine rapid drug screen (hosp performed)     Status: None   Collection Time: 07/13/15  3:08 PM  Result Value Ref Range   Opiates NONE DETECTED NONE DETECTED    Cocaine NONE DETECTED NONE DETECTED   Benzodiazepines NONE DETECTED NONE DETECTED   Amphetamines NONE DETECTED NONE DETECTED   Tetrahydrocannabinol NONE DETECTED NONE DETECTED   Barbiturates NONE DETECTED NONE DETECTED    Comment:  DRUG SCREEN FOR MEDICAL PURPOSES ONLY.  IF CONFIRMATION IS NEEDED FOR ANY PURPOSE, NOTIFY LAB WITHIN 5 DAYS.        LOWEST DETECTABLE LIMITS FOR URINE DRUG SCREEN Drug Class       Cutoff (ng/mL) Amphetamine      1000 Barbiturate      200 Benzodiazepine   725 Tricyclics       366 Opiates          300 Cocaine          300 THC              50   Urinalysis, Routine w reflex microscopic (not at [facility])     Status: Abnormal   Collection Time: 07/13/15  3:08 PM  Result Value Ref Range   Color, Urine YELLOW YELLOW   APPearance CLEAR CLEAR   Specific Gravity, Urine 1.012 1.005 - 1.030   pH 6.5 5.0 - 8.0   Glucose, UA 250 (A) NEGATIVE mg/dL   Hgb urine dipstick SMALL (A) NEGATIVE   Bilirubin Urine NEGATIVE NEGATIVE   Ketones, ur NEGATIVE NEGATIVE mg/dL   Protein, ur >300 (A) NEGATIVE mg/dL   Urobilinogen, UA 0.2 0.0 - 1.0 mg/dL   Nitrite NEGATIVE NEGATIVE   Leukocytes, UA NEGATIVE NEGATIVE  Urine microscopic-add on     Status: None   Collection Time: 07/13/15  3:08 PM  Result Value Ref Range   Squamous Epithelial / LPF RARE RARE   WBC, UA 0-2 <3 WBC/hpf  Glucose, capillary     Status: Abnormal   Collection Time: 07/13/15  4:58 PM  Result Value Ref Range   Glucose-Capillary 148 (H) 65 - 99 mg/dL    No results found.  Review of Systems  Constitutional: Negative for fever, chills and malaise/fatigue.  HENT: Negative for congestion, nosebleeds and tinnitus.   Eyes: Positive for blurred vision.       Seeing floaters and unusual eye movements  Respiratory: Negative.   Cardiovascular: Negative.   Gastrointestinal: Negative.   Genitourinary: Negative.   Musculoskeletal: Negative.   Skin: Negative.   Neurological: Positive for dizziness.  Negative for tremors, sensory change, focal weakness, weakness and headaches.       Vertigo-like symptoms  Endo/Heme/Allergies: Negative.   Psychiatric/Behavioral: The patient is nervous/anxious.    Blood pressure 167/82, pulse 103, temperature 98.8 F (37.1 C), temperature source Oral, resp. rate 16, height '5\' 5"'  (1.651 m), weight 85.276 kg (188 lb), last menstrual period 07/04/2015, SpO2 100 %. Physical Exam  Nursing note and vitals reviewed. Constitutional: She is oriented to person, place, and time. She appears well-developed and well-nourished. No distress.  HENT:  Head: Normocephalic and atraumatic.  Left Ear: External ear normal.  Mouth/Throat: No oropharyngeal exudate.  Eyes: No scleral icterus.  Bilateral horizontal nystagmus  Neck: Normal range of motion. Neck supple. No JVD present.  Cardiovascular: Regular rhythm and normal heart sounds.   No murmur heard. Regular tachycardia  Respiratory: Effort normal and breath sounds normal. She has no wheezes. She has no rales.  GI: Soft. Bowel sounds are normal. She exhibits no distension. There is no tenderness. There is no rebound.  Musculoskeletal: Normal range of motion. She exhibits edema.  Status post right below-knee amputation, left thumb amputation, right index finger amputation. Trace left ankle edema  Neurological: She is alert and oriented to person, place, and time. No cranial nerve deficit. Coordination normal.  NO ASTERIXIS  Skin: Skin is warm and dry. No rash noted. No erythema.  Psychiatric:  She has a normal mood and affect.    Assessment/Plan: 1. Acute renal failure on chronic kidney disease stage IV: Unclear etio-pathology and unlikely to be hemodynamically mediated as she did not start diuretics until yesterday and does not have any manifestations of volume depletion. Elevated creatinine possibly be due to RAS activation by increased diuresis versus unmasking lower GFR by restoring closer to euvolemic state. Agree  with initial plan to hold diuretic and give some intravenous fluids (will restrict this to 0.5 L). Will check renal ultrasound with low pretest suspicion that this is obstructive. No obvious nephrotoxic exposure noted and with limited utility for aggressive evaluation for glomerulonephritis (urinalysis weighs against this). May need vascular surgery input into access planning prior to discharge. 2. Hyperkalemia: expected to improve with management of metabolic acidosis and agree with dietitian consultation for education on low potassium diet. Off ARB. 3. Metabolic acidosis: Appears to be non-anion gap (albumin unavailable for correction), likely from worsening chronic kidney disease-agree with oral sodium bicarbonate supplementation. 4. Anemia: Anemia of chronic kidney disease with recent ?transfusion. Check iron studies and plan for ESA 5. Metabolic bone disease: We will add phosphorus level to previously drawn labs, on calcitriol for PTH suppression  Billiejo Sorto K. 07/13/2015, 7:30 PM

## 2015-07-13 NOTE — ED Notes (Signed)
Attempted 2 IV sticks without success 

## 2015-07-13 NOTE — ED Notes (Signed)
MD at bedside. 

## 2015-07-13 NOTE — H&P (Addendum)
[facility] History and Physical  ARIBEL CHAUSSEE [id number] DOB: [birth year] DOA: 07/13/2015  Referring physician:  Hyman Bible PCP:  Maggie Font, MD   Chief Complaint:  Vision changes  HPI:  The patient is a 49 y.o. year-old female with history of hypertension diagnosed several years ago and which remains uncontrolled, diabetes mellitus type 2 diagnosed more than 10 years ago, multiple limb amputation secondary to diabetes/peripheral vascular disease, alcohol dependency, progressive chronic kidney disease who presents with "seeing fish and caterpillars".   The patient was recently referred by her primary care doctor to nephrology secondary to a rising creatinine. 2 years ago, her creatinine was 1.5, but recently in clinic her creatinine was up to 3.6. She saw Dr. Joelyn Oms in clinic 2 weeks ago at which time her creatinine was up to 4.2. Her rapidly progressive chronic kidney disease was attributed to hypertension and diabetes mellitus. The nephrologist discussed with her the possibility of dialysis.  She denies shortness of breath, lower extremity swelling.  She has been compliant with her Lasix, the dose of which was doubled 2 weeks ago. She also states she has not been taking her ARB or any over-the-counter NSAIDs.  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 She has had a cataract extraction 2 months ago which improved her vision in her right eye, however she, she has had some floaters for the last couple of months. She has not talked to her ophthalmologist about these problems. She denies any acute changes in her vision.    In the emergency room, her vital signs were notable for mild tachycardia to the low 100s, blood pressure mildly elevated 167/82, breathing comfortably on room air. Her labs were notable for white blood cell count of 12, stable hemoglobin at 9.2, BNP concerning for potassium now of 5.4 and CO2 of 17. Her BUN is 59 with a creatinine of  4.65.  She was given IV fluids in the emergency department and is being admitted for acute on chronic kidney disease/progression of chronic kidney disease. The case is been discussed with nephrology who will consult.   Review of Systems:  General:  Denies fevers, chills, weight loss or gain HEENT:  Denies changes to hearing and vision, rhinorrhea, sinus congestion, sore throat CV:  Denies chest pain and palpitations, lower extremity edema.  PULM:  Denies SOB, wheezing, cough.   GI:  Denies nausea, vomiting, constipation, diarrhea.   GU:  Denies dysuria, frequency, urgency ENDO:  Denies polyuria, polydipsia.   HEME:  Denies hematemesis, blood in stools, melena, abnormal bruising or bleeding.  LYMPH:  Denies lymphadenopathy.   MSK:  Denies arthralgias, myalgias.  Has multiple digits and dictated and a right BKA DERM:  Denies skin rash or ulcer.   NEURO:  Denies focal numbness, weakness, slurred speech, confusion, facial droop.  PSYCH:  Denies anxiety and depression.    Past Medical History  Diagnosis Date  . Diabetes mellitus type 2, uncontrolled   . Hypertension   . Anemia   . Alcohol dependency   . Diabetes mellitus     type 2  . Necrotizing fasciitis    Past Surgical History  Procedure Laterality Date  . Amputation      right first and left middle finger amputation 2/2 OM 08/2011 by Dr. Amedeo Plenty  . Cesarean section    . I&d left thumb  12/23/2011  . Tubal ligation    . I&d extremity  12/24/2011    Procedure: IRRIGATION AND DEBRIDEMENT  EXTREMITY;  Surgeon: Tennis Must, MD;  Location: [place];  Service: Orthopedics;  Laterality: Left;  I&Dleft thumb with partial amputation  . I&d extremity  12/26/2011    Procedure: IRRIGATION AND DEBRIDEMENT EXTREMITY;  Surgeon: Tennis Must, MD;  Location: [place];  Service: Orthopedics;  Laterality: Left;  . I&d extremity  12/23/2011    Procedure: IRRIGATION AND DEBRIDEMENT EXTREMITY;  Surgeon: Tennis Must, MD;  Location: [place];  Service:  Orthopedics;  Laterality: Left;  I&D Left thumb, hand and arm  . Amputation  01/18/2012    Procedure: AMPUTATION DIGIT;  Surgeon: Tennis Must, MD;  Location: [place];  Service: Orthopedics;  Laterality: Left;  revision amputation left thumb  . Amputation  08/25/2012    Procedure: AMPUTATION BELOW KNEE;  Surgeon: Newt Minion, MD;  Location: [place];  Service: Orthopedics;  Laterality: Right;  Right Below Knee Amputation   Social History:  reports that she has never smoked. She has never used smokeless tobacco. She reports that she drinks about 0.6 oz of alcohol per week. She reports that she does not use illicit drugs.  No Known Allergies  Family History  Problem Relation Age of Onset  . Diabetes Mother   . Diabetes Father   . Kidney failure Sister   . Diabetes Sister   . High blood pressure Sister      Prior to Admission medications   Medication Sig Start Date End Date Taking? Authorizing Provider  amLODipine (NORVASC) 10 MG tablet Take 1 tablet (10 mg total) by mouth daily. 08/30/12  Yes Thurnell Lose, MD  atorvastatin (LIPITOR) 20 MG tablet Take 20 mg by mouth daily.   Yes Historical Provider, MD  calcitRIOL (ROCALTROL) 0.25 MCG capsule Take 0.25 mcg by mouth daily.   Yes Historical Provider, MD  furosemide (LASIX) 40 MG tablet Take 40 mg by mouth 2 (two) times daily.   Yes Historical Provider, MD  gabapentin (NEURONTIN) 300 MG capsule Take 1 capsule (300 mg total) by mouth 3 (three) times daily. 08/30/12  Yes Thurnell Lose, MD  Insulin NPH Isophane & Regular (HUMULIN 70/30 KWIKPEN [place]) Inject 10 Units into the skin 2 (two) times daily.   Yes Historical Provider, MD  losartan (COZAAR) 50 MG tablet Take 100 mg by mouth daily.   Yes Historical Provider, MD  meclizine (ANTIVERT) 25 MG tablet Take 50 mg by mouth 2 (two) times daily as needed for dizziness.   Yes Historical Provider, MD   Physical Exam: Filed Vitals:   07/13/15 1545 07/13/15 1600 07/13/15 1615  07/13/15 1704  BP: 147/76 147/84 165/89 167/82  Pulse: 102 103 103 103  Temp:    98.8 F (37.1 C)  TempSrc:    Oral  Resp: 20 16 12 16   Height:      Weight:      SpO2: 100% 100% 100% 100%     General:  Adult female, no acute distress  Eyes:  Left pupil is fixed and nonreactive, right is reactive to light, anicteric, non-injected.  ENT:  Nares clear.  OP clear, non-erythematous without plaques or exudates.  MMM.  Neck:  Supple without TM or JVD.    Lymph:  No cervical, supraclavicular, or submandibular LAD.  Cardiovascular:  RRR, normal S1, S2, 2-6 systolic murmur at the left sternal border.  2+ pulses, warm extremities  Respiratory:  CTA bilaterally without increased WOB.  Abdomen:  NABS.  Soft, ND/NT.    Skin:  No rashes  or focal lesions.  Musculoskeletal:  Normal bulk and tone.  No LE edema.  Right BKA, right index finger and left thumb invitations  Psychiatric:  A & O x 4.  Appropriate affect.  Neurologic:  CN 3-12 intact.  5/5 strength.  Sensation intact.  Labs on Admission:  Basic Metabolic Panel:  Recent Labs Lab 07/13/15 1325  NA 138  K 5.4*  CL 111  CO2 17*  GLUCOSE 182*  BUN 59*  CREATININE 4.65*  CALCIUM 8.2*   Liver Function Tests: No results for input(s): AST, ALT, ALKPHOS, BILITOT, PROT, ALBUMIN in the last 168 hours. No results for input(s): LIPASE, AMYLASE in the last 168 hours. No results for input(s): AMMONIA in the last 168 hours. CBC:  Recent Labs Lab 07/13/15 1325  WBC 12.0*  NEUTROABS 9.0*  HGB 9.2*  HCT 29.7*  MCV 72.6*  PLT 368   Cardiac Enzymes: No results for input(s): CKTOTAL, CKMB, CKMBINDEX, TROPONINI in the last 168 hours.  BNP (last 3 results) No results for input(s): BNP in the last 8760 hours.  ProBNP (last 3 results) No results for input(s): PROBNP in the last 8760 hours.  CBG:  Recent Labs Lab 07/13/15 1658  GLUCAP 148*    Radiological Exams on Admission: No results found.  EKG:  pending  Assessment/Plan Active Problems:   Diabetes mellitus type 2, uncontrolled   Alcohol abuse   Leukocytosis   Hyperkalemia   Acute on chronic renal failure  ---  Acute on chronic kidney disease versus progression of chronic kidney disease, now with hyperkalemia and mild metabolic acidosis.  This may be secondary to the increased Lasix that she has been on for the last few weeks, however I wonder if she is responding well to her Lasix anyway given her dose of 40 mg and the degree of her chronic any disease. This may just be natural progression of her CK D. -  IV fluids -  Renal dialysis diet -  Nutrition consultation to provide education about low potassium -  Nephrology consultation -  Start sodium bicarbonate 1300 mg by mouth twice a day -  Start Rena-Vite -  Renally dose medications and minimize nephrotoxins -  F/u ECG  Floaters -  Defer to ophthalmology as outpatient  Secondary hyperparathyroidism secondary to CK D, continue calcitriol  Peripheral vascular disease status post multiple amputations,  -  continue atorvastatin -  Hold aspirin for now since she may need a renal biopsy or other procedures in the next few days  Diabetes mellitus type 2 with renal manifestations and peripheral neuropathy -  A1c -  Hold 70/30 insulin and start Levemir 10 units plus low-dose sliding scale insulin -  Change to once daily gabapentin  Hx of ETOH abuse (denies recent use) -  CIWA -  thiamine, folate -  UDS neg  Uncontrolled hypertension, blood pressure still elevated -  Continue Norvasc -  Avoid ACE inhibitor and ARB given progression of CK D -  Start beta blocker  Leukocytosis, likely secondary to mild dehydration, no evidence of underlying infection   Microcytic anemia, likely partially due to CKD but may have some superimposed iron deficiency -  Iron studies, B12, folate -  TSH -  Occult stool -  Repeat hgb in AM  Diet:  Renal/diabetic Access:  PIV IVF:   yes Proph:  heparin  Code Status: full Family Communication: patient and her boyfriend Disposition Plan: Admit to telemetry  Time spent: 60 min Sadiq Mccauley, [facility] Pager [phone]  If 7PM-7AM, please contact night-coverage [email or web address] Password [email or web address] 07/13/2015, 5:50 PM

## 2015-07-14 ENCOUNTER — Inpatient Hospital Stay (HOSPITAL_COMMUNITY): Payer: Medicare Other

## 2015-07-14 DIAGNOSIS — N184 Chronic kidney disease, stage 4 (severe): Secondary | ICD-10-CM

## 2015-07-14 DIAGNOSIS — N189 Chronic kidney disease, unspecified: Secondary | ICD-10-CM

## 2015-07-14 DIAGNOSIS — N179 Acute kidney failure, unspecified: Principal | ICD-10-CM

## 2015-07-14 DIAGNOSIS — D72829 Elevated white blood cell count, unspecified: Secondary | ICD-10-CM

## 2015-07-14 DIAGNOSIS — E875 Hyperkalemia: Secondary | ICD-10-CM

## 2015-07-14 DIAGNOSIS — E1165 Type 2 diabetes mellitus with hyperglycemia: Secondary | ICD-10-CM

## 2015-07-14 LAB — VITAMIN B12: Vitamin B-12: 376 pg/mL (ref 180–914)

## 2015-07-14 LAB — RENAL FUNCTION PANEL
Albumin: 2.4 g/dL — ABNORMAL LOW (ref 3.5–5.0)
Anion gap: 11 (ref 5–15)
BUN: 53 mg/dL — ABNORMAL HIGH (ref 6–20)
CO2: 16 mmol/L — ABNORMAL LOW (ref 22–32)
Calcium: 7.8 mg/dL — ABNORMAL LOW (ref 8.9–10.3)
Chloride: 113 mmol/L — ABNORMAL HIGH (ref 101–111)
Creatinine, Ser: 4.17 mg/dL — ABNORMAL HIGH (ref 0.44–1.00)
GFR calc Af Amer: 14 mL/min — ABNORMAL LOW (ref >60)
GFR calc non Af Amer: 12 mL/min — ABNORMAL LOW (ref >60)
Glucose, Bld: 134 mg/dL — ABNORMAL HIGH (ref 65–99)
Phosphorus: 5.2 mg/dL — ABNORMAL HIGH (ref 2.5–4.6)
Potassium: 5.7 mmol/L — ABNORMAL HIGH (ref 3.5–5.1)
Sodium: 140 mmol/L (ref 135–145)

## 2015-07-14 LAB — CBC
HCT: 25.4 % — ABNORMAL LOW (ref 36.0–46.0)
Hemoglobin: 7.9 g/dL — ABNORMAL LOW (ref 12.0–15.0)
MCH: 22.3 pg — ABNORMAL LOW (ref 26.0–34.0)
MCHC: 31.1 g/dL (ref 30.0–36.0)
MCV: 71.8 fL — ABNORMAL LOW (ref 78.0–100.0)
Platelets: 249 10*3/uL (ref 150–400)
RBC: 3.54 MIL/uL — ABNORMAL LOW (ref 3.87–5.11)
RDW: 17.6 % — ABNORMAL HIGH (ref 11.5–15.5)
WBC: 10.1 10*3/uL (ref 4.0–10.5)

## 2015-07-14 LAB — IRON AND TIBC
Iron: 32 ug/dL (ref 28–170)
Saturation Ratios: 11 % (ref 10.4–31.8)
TIBC: 293 ug/dL (ref 250–450)
UIBC: 261 ug/dL

## 2015-07-14 LAB — GLUCOSE, CAPILLARY
Glucose-Capillary: 134 mg/dL — ABNORMAL HIGH (ref 65–99)
Glucose-Capillary: 143 mg/dL — ABNORMAL HIGH (ref 65–99)
Glucose-Capillary: 122 mg/dL — ABNORMAL HIGH (ref 65–99)
Glucose-Capillary: 120 mg/dL — ABNORMAL HIGH (ref 65–99)

## 2015-07-14 LAB — OCCULT BLOOD X 1 CARD TO LAB, STOOL: Fecal Occult Bld: NEGATIVE

## 2015-07-14 LAB — TSH: TSH: 5.685 u[IU]/mL — ABNORMAL HIGH (ref 0.350–4.500)

## 2015-07-14 LAB — FOLATE: Folate: 24.9 ng/mL (ref >5.9)

## 2015-07-14 LAB — HIV ANTIBODY (ROUTINE TESTING W REFLEX): HIV Screen 4th Generation wRfx: NONREACTIVE

## 2015-07-14 LAB — FERRITIN: Ferritin: 23 ng/mL (ref 11–307)

## 2015-07-14 MED ORDER — SODIUM CHLORIDE 0.9 % IV SOLN
510.0000 mg | Freq: Once | INTRAVENOUS | Status: AC
  Administered 2015-07-14: 510 mg via INTRAVENOUS
  Filled 2015-07-14: qty 17

## 2015-07-14 MED ORDER — FUROSEMIDE 40 MG PO TABS
40.0000 mg | ORAL_TABLET | Freq: Two times a day (BID) | ORAL | Status: DC
  Administered 2015-07-14 – 2015-07-16 (×4): 40 mg via ORAL
  Filled 2015-07-14 (×4): qty 1

## 2015-07-14 NOTE — Consult Note (Addendum)
Vascular and [place]  Reason for consult: permanent access Consulting physician: Dr. Mercy Moore  History of Present Illness  [name] is a 49 y.o. (06-29-1966) female who presents for evaluation for permanent access. She has CKD stage IV not yet requiring hemodialysis. The patient is right hand dominant.  The patient has not had previous access procedures.  She has never required temporary dialysis. She does not have a pacemaker.   She presented to the [facility] ED yesterday after having worsening vision changes in her right eye. 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 She has a right BKA due to infected diabetic foot ulcer.   Past Medical History  Diagnosis Date  . Diabetes mellitus type 2, uncontrolled   . Hypertension   . Anemia   . Alcohol dependency   . Diabetes mellitus     type 2  . Necrotizing fasciitis     Past Surgical History  Procedure Laterality Date  . Amputation      right first and left middle finger amputation 2/2 OM 08/2011 by Dr. Amedeo Plenty  . Cesarean section    . I&d left thumb  12/23/2011  . Tubal ligation    . I&d extremity  12/24/2011    Procedure: IRRIGATION AND DEBRIDEMENT EXTREMITY;  Surgeon: Tennis Must, MD;  Location: [place];  Service: Orthopedics;  Laterality: Left;  I&Dleft thumb with partial amputation  . I&d extremity  12/26/2011    Procedure: IRRIGATION AND DEBRIDEMENT EXTREMITY;  Surgeon: Tennis Must, MD;  Location: [place];  Service: Orthopedics;  Laterality: Left;  . I&d extremity  12/23/2011    Procedure: IRRIGATION AND DEBRIDEMENT EXTREMITY;  Surgeon: Tennis Must, MD;  Location: [place];  Service: Orthopedics;  Laterality: Left;  I&D Left thumb, hand and arm  . Amputation  01/18/2012    Procedure: AMPUTATION DIGIT;  Surgeon: Tennis Must, MD;  Location: [place];  Service: Orthopedics;  Laterality: Left;  revision  amputation left thumb  . Amputation  08/25/2012    Procedure: AMPUTATION BELOW KNEE;  Surgeon: Newt Minion, MD;  Location: [place];  Service: Orthopedics;  Laterality: Right;  Right Below Knee Amputation    History   Social History  . Marital Status: Single    Spouse Name: N/A  . Number of Children: 3  . Years of Education: N/A   Occupational History  .     Social History Main Topics  . Smoking status: Never Smoker   . Smokeless tobacco: Never Used  . Alcohol Use: 0.6 oz/week    1 Cans of beer per week     Comment: h/o alcohol abuse  . Drug Use: No  . Sexual Activity: Yes   Other Topics Concern  . Not on file   Social History Narrative             Family History  Problem Relation Age of Onset  . Diabetes Mother   . Diabetes Father   . Kidney failure Sister   . Diabetes Sister   . High blood pressure Sister     No current facility-administered medications on file prior to encounter.   Current Outpatient Prescriptions on File Prior to Encounter  Medication Sig Dispense Refill  . amLODipine (NORVASC) 10 MG tablet Take 1 tablet (10 mg total) by mouth daily.    [name] gabapentin (NEURONTIN) 300 MG capsule Take 1 capsule (300 mg  total) by mouth 3 (three) times daily.      No Known Allergies   REVIEW OF SYSTEMS:  (Positives checked otherwise negative)  CARDIOVASCULAR:  []  chest pain, []  chest pressure, []  palpitations, []  shortness of breath when laying flat, []  shortness of breath with exertion,  []  pain in feet when walking, []  pain in feet when laying flat, []  history of blood clot in veins (DVT), []  history of phlebitis, []  swelling in legs, []  varicose veins  PULMONARY:  []  productive cough, []  asthma, []  wheezing  NEUROLOGIC:  []  weakness in arms or legs, []  numbness in arms or legs, []  difficulty speaking or slurred speech, []  temporary loss of vision in one eye, []  dizziness  HEMATOLOGIC:  []  bleeding problems, []  problems with blood clotting too  easily  MUSCULOSKEL:  []  joint pain, []  joint swelling  GASTROINTEST:  []  vomiting blood, []  blood in stool     GENITOURINARY:  []  burning with urination, []  blood in urine  PSYCHIATRIC:  []  history of major depression  INTEGUMENTARY:  []  rashes, []  ulcers  CONSTITUTIONAL:  []  fever, []  chills  Physical Examination  Filed Vitals:   07/13/15 1704 07/13/15 2100 07/14/15 0500 07/14/15 0759  BP: 167/82 135/77 150/79 154/84  Pulse: 103 97 94 94  Temp: 98.8 F (37.1 C) 98.3 F (36.8 C) 98.7 F (37.1 C) 98.7 F (37.1 C)  TempSrc: Oral Oral Oral Oral  Resp: 16 16 16 18   Height:  5\' 5"  (1.651 m)    Weight:  200 lb 12.8 oz (91.082 kg)    SpO2: 100% 99% 98% 100%   Body mass index is 33.41 kg/(m^2).  General: A&O x 3, WD obese female in NAd  Head: [place]/AT  Neck: Supple  Pulmonary: Sym exp, good air movt, CTAB, no rales, rhonchi, & wheezing  Cardiac: RRR, Nl S1, S2, no Murmurs, rubs or gallops  Vascular: palpable 2+ radial and brachial pulses b/l. Right BKA.   Musculoskeletal: No muscle wasting or atrophy.    Neurologic: No focal deficits. Blind left eye.   Psychiatric: Judgment intact, Mood & affect appropriate for pt's clinical situation  Dermatologic: See M/S exam for extremity exam, no rashes otherwise noted  Non-Invasive Vascular Imaging  Vein Mapping  (Date: 07/14/2015) pending   Medical Decision Making  [name] is a 49 y.o. female who presents with CKD stage IV not requiring hemodialysis.   Await results of vein mapping. Patient is right handed.   I had an extensive discussion with this patient in regards to the nature of access surgery, including risk, benefits, and alternatives.    Patient agreeable to procedure. Plan for OR sometime this week. May need to wait until later this week due to OR availability.   Dr. Oneida Alar to see patient.    Virgina Jock, PA-C [facility] [place] Office: [phone] Pager:  [phone]  07/14/2015, 2:55 PM   History and exam details as above Pt has had bilateral prior finger amputations but does have a radial pulse bilaterally Left basilic vein may be adequate for fistula right arm veins and left cephalic vein are not Needs all IVs removed from left arm.  Plan is for left basilic vein fistula versus AV graft on Wednesday or Thursday this week.  This can be done as outpt if pt ready for d/c.  Risks benefits possible complications procedure details discussed with pt including but not limited to bleeding infection steal non maturation graft thrombosis.  She wishes to  proceed.  Ruta Hinds, MD [facility] [place] Office: [phone] Pager: [phone]

## 2015-07-14 NOTE — Progress Notes (Signed)
S: Denies uremic Sxs O:BP 154/84 mmHg  Pulse 94  Temp(Src) 98.7 F (37.1 C) (Oral)  Resp 18  Ht 5\' 5"  (1.651 m)  Wt 91.082 kg (200 lb 12.8 oz)  BMI 33.41 kg/m2  SpO2 100%  LMP 07/04/2015  Intake/Output Summary (Last 24 hours) at 07/14/15 1240 Last data filed at 07/14/15 0944  Gross per 24 hour  Intake   1140 ml  Output    350 ml  Net    790 ml   Weight change:  IN:[id number] and alert CVS:RRR Resp:clear Abd:+ BS NTND Ext: Lt BKA, 0-tr edema on RT NEURO:Ox3 no asterixis   . amLODipine  10 mg Oral Daily  . atorvastatin  20 mg Oral Daily  . calcitRIOL  0.25 mcg Oral Daily  . carvedilol  6.25 mg Oral BID WC  . docusate sodium  100 mg Oral BID  . folic acid  1 mg Oral Daily  . gabapentin  300 mg Oral QHS  . heparin  5,000 Units Subcutaneous 3 times per day  . insulin aspart  0-9 Units Subcutaneous TID WC  . insulin detemir  10 Units Subcutaneous QHS  . LORazepam  0-4 mg Intravenous Q6H   Followed by  . [START ON 07/15/2015] LORazepam  0-4 mg Intravenous Q12H  . multivitamin  1 tablet Oral QHS  . pneumococcal 23 valent vaccine  0.5 mL Intramuscular Tomorrow-1000  . sodium bicarbonate  1,300 mg Oral BID  . thiamine  100 mg Oral Daily   Or  . thiamine  100 mg Intravenous Daily   US Renal  07/14/2015   CLINICAL DATA:  Acute renal failure  EXAM: RENAL / URINARY TRACT ULTRASOUND COMPLETE  COMPARISON:  Ultrasound 01/10/2012  FINDINGS: Right Kidney:  Length: 11.1 cm. Echogenicity within normal limits. No mass or hydronephrosis visualized.  Left Kidney:  Length: 12.1 cm. Echogenicity within normal limits. No mass or hydronephrosis visualized.  Bladder:  Appears normal for degree of bladder distention.  IMPRESSION: Negative   Electronically Signed   By: Franchot Gallo M.D.   On: 07/14/2015 10:37   BMET    Component Value Date/Time   NA 140 07/14/2015 0648   NA 139 09/12/2013 0736   K 5.7* 07/14/2015 0648   K 5.2* 11/07/2013 0802   CL 113* 07/14/2015 0648   CL 111* 09/12/2013 0736    CO2 16* 07/14/2015 0648   CO2 21 09/12/2013 0736   GLUCOSE 134* 07/14/2015 0648   GLUCOSE 181* 09/12/2013 0736   BUN 53* 07/14/2015 0648   BUN 35* 09/12/2013 0736   CREATININE 4.17* 07/14/2015 0648   CREATININE 1.55* 09/12/2013 0736   CREATININE 0.94 09/12/2012 1113   CALCIUM 7.8* 07/14/2015 0648   CALCIUM 7.8* 09/12/2013 0736   GFRNONAA 12* 07/14/2015 0648   GFRNONAA 40* 09/12/2013 0736   GFRNONAA 74 09/12/2012 1113   GFRAA 14* 07/14/2015 0648   GFRAA 46* 09/12/2013 0736   GFRAA 85 09/12/2012 1113   CBC    Component Value Date/Time   WBC 10.1 07/14/2015 0648   WBC 9.4 06/06/2013 1244   RBC 3.54* 07/14/2015 0648   RBC 3.95 06/06/2013 1244   RBC 3.17* 01/09/2012 1630   HGB 7.9* 07/14/2015 0648   HGB 9.2* 09/12/2013 0736   HCT 25.4* 07/14/2015 0648   HCT 28.8* 06/06/2013 1244   PLT 249 07/14/2015 0648   PLT 359 06/06/2013 1244   MCV 71.8* 07/14/2015 0648   MCV 73* 06/06/2013 1244   MCH 22.3* 07/14/2015 0648   MCH  23.1* 06/06/2013 1244   MCHC 31.1 07/14/2015 0648   MCHC 31.6* 06/06/2013 1244   RDW 17.6* 07/14/2015 0648   RDW 14.5 06/06/2013 1244   LYMPHSABS 2.0 07/13/2015 1325   LYMPHSABS 1.9 06/06/2013 1244   MONOABS 0.5 07/13/2015 1325   MONOABS 0.5 06/06/2013 1244   EOSABS 0.5 07/13/2015 1325   EOSABS 0.2 06/06/2013 1244   BASOSABS 0.1 07/13/2015 1325   BASOSABS 0.1 06/06/2013 1244     Assessment:  1. Acute on CKD 4, Scr better this AM 2. Anemia with iron def 3. HTN 4. DM 5. Hyperkalemia 6. Sec HPTH on calcitriol Plan: 1. Kayexalate 2. Oral bicarb 3. IV iron 4. Daily Scr 5. Ask [facility]VS to see 6. If renal fx stable to improved in AM then she could be DC'd from renal standpoint 7. Resume lasix   [name]

## 2015-07-14 NOTE — Progress Notes (Signed)
PROGRESS NOTE  [name] [id number] DOB: 05/20/1966 DOA: 07/13/2015 PCP: Maggie Font, MD  HPI/Recap of past 70 hours: 49 year old female with past medical history of diabetes mellitus, stage IV chronic kidney disease who over the last week, noted to have increasing creatinine from 3.6 up to 4.2 and then came into the emergency room on 8/7 with 2 months of visual complaints of seeing floaters and found to have a creatinine now at 4.65. Seen by nephrology and etiology unclear. Not felt to be secondary to offending medication. Patient started on gentle hydration and by 8/8, creatinine down to 4.17. Patient feels about the same.  Assessment/Plan: Active Problems:   Diabetes mellitus type 2, uncontrolled: CBG stable.   Alcohol abuse: Monitor withdrawal, stable   Leukocytosis: Question mark stress margination, resolved. No signs of infection.    Acute on chronic renal failure: Unclear etiology. Continues to improve, on oral bicarbonate plus Kayexalate for hyperkalemia. Seen by vascular surgery and for vein mapping and potential surgery for fistula placement this week. Renal   ultrasound unremarkable  Visual disturbances: Nothing acute. No vision loss. Patient will follow-up with her ophthalmologist as outpatient  Code Status: Full code  Family Communication: Boyfriend at the bedside  Disposition Plan: Discharge once fistula plan placed   Consultants:  Faster surgery  Nephrology  Procedures:  Vein mapping, results pending  Antibiotics:  None   Objective: BP 145/97 mmHg  Pulse 96  Temp(Src) 98.6 F (37 C) (Oral)  Resp 18  Ht 5\' 5"  (1.651 m)  Wt 91.082 kg (200 lb 12.8 oz)  BMI 33.41 kg/m2  SpO2 100%  LMP 07/04/2015  Intake/Output Summary (Last 24 hours) at 07/14/15 1806 Last data filed at 07/14/15 1744  Gross per 24 hour  Intake   1620 ml  Output    350 ml  Net   1270 ml   Filed Weights   07/13/15 1201 07/13/15 2100  Weight: 85.276 kg (188 lb) 91.082  kg (200 lb 12.8 oz)    Exam:   General:  Alert and oriented 3, no acute distress  Cardiovascular: regular rate and rhythm, S1-S2  Respiratory: \clear to auscultation bilaterally  Abdomen: soft, nontender, nondistended, positive bowel sounds  Musculoskeletal: no clubbing or cyanosis or edema   Data Reviewed: Basic Metabolic Panel:  Recent Labs Lab 07/13/15 1325 07/14/15 0648  NA 138 140  K 5.4* 5.7*  CL 111 113*  CO2 17* 16*  GLUCOSE 182* 134*  BUN 59* 53*  CREATININE 4.65* 4.17*  CALCIUM 8.2* 7.8*  PHOS 5.8* 5.2*   Liver Function Tests:  Recent Labs Lab 07/14/15 0648  ALBUMIN 2.4*   No results for input(s): LIPASE, AMYLASE in the last 168 hours. No results for input(s): AMMONIA in the last 168 hours. CBC:  Recent Labs Lab 07/13/15 1325 07/14/15 0648  WBC 12.0* 10.1  NEUTROABS 9.0*  --   HGB 9.2* 7.9*  HCT 29.7* 25.4*  MCV 72.6* 71.8*  PLT 368 249   Cardiac Enzymes:   No results for input(s): CKTOTAL, CKMB, CKMBINDEX, TROPONINI in the last 168 hours. BNP (last 3 results) No results for input(s): BNP in the last 8760 hours.  ProBNP (last 3 results) No results for input(s): PROBNP in the last 8760 hours.  CBG:  Recent Labs Lab 07/13/15 1658 07/13/15 2152 07/14/15 0759 07/14/15 1157  GLUCAP 148* 198* 134* 143*    No results found for this or any previous visit (from the past 240 hour(s)).   Studies: US Renal  07/14/2015   CLINICAL DATA:  Acute renal failure  EXAM: RENAL / URINARY TRACT ULTRASOUND COMPLETE  COMPARISON:  Ultrasound 01/10/2012  FINDINGS: Right Kidney:  Length: 11.1 cm. Echogenicity within normal limits. No mass or hydronephrosis visualized.  Left Kidney:  Length: 12.1 cm. Echogenicity within normal limits. No mass or hydronephrosis visualized.  Bladder:  Appears normal for degree of bladder distention.  IMPRESSION: Negative   Electronically Signed   By: Franchot Gallo M.D.   On: 07/14/2015 10:37    Scheduled Meds: .  amLODipine  10 mg Oral Daily  . atorvastatin  20 mg Oral Daily  . calcitRIOL  0.25 mcg Oral Daily  . carvedilol  6.25 mg Oral BID WC  . docusate sodium  100 mg Oral BID  . folic acid  1 mg Oral Daily  . furosemide  40 mg Oral BID  . gabapentin  300 mg Oral QHS  . heparin  5,000 Units Subcutaneous 3 times per day  . insulin aspart  0-9 Units Subcutaneous TID WC  . insulin detemir  10 Units Subcutaneous QHS  . LORazepam  0-4 mg Intravenous Q6H   Followed by  . [START ON 07/15/2015] LORazepam  0-4 mg Intravenous Q12H  . multivitamin  1 tablet Oral QHS  . sodium bicarbonate  1,300 mg Oral BID  . thiamine  100 mg Oral Daily   Or  . thiamine  100 mg Intravenous Daily    Continuous Infusions:    Time spent: 15 minutes  [facility] Pager [phone]. If 7PM-7AM, please contact night-coverage at [email or web address], password [facility] 07/14/2015, 6:06 PM  LOS: 1 day

## 2015-07-15 ENCOUNTER — Telehealth: Payer: Self-pay | Admitting: *Deleted

## 2015-07-15 LAB — CBC
HCT: 28.2 % — ABNORMAL LOW (ref 36.0–46.0)
Hemoglobin: 8.5 g/dL — ABNORMAL LOW (ref 12.0–15.0)
MCH: 21.7 pg — ABNORMAL LOW (ref 26.0–34.0)
MCHC: 30.1 g/dL (ref 30.0–36.0)
MCV: 72.1 fL — ABNORMAL LOW (ref 78.0–100.0)
Platelets: 342 10*3/uL (ref 150–400)
RBC: 3.91 MIL/uL (ref 3.87–5.11)
RDW: 17.5 % — ABNORMAL HIGH (ref 11.5–15.5)
WBC: 10.8 10*3/uL — ABNORMAL HIGH (ref 4.0–10.5)

## 2015-07-15 LAB — UIFE/LIGHT CHAINS/TP QN, 24-HR UR
% BETA, Urine: 15.1 %
ALPHA 1 URINE: 6.7 %
Albumin, U: 55.2 %
Alpha 2, Urine: 9.6 %
Free Kappa/Lambda Ratio: 6.43 (ref 2.04–10.37)
Free Lambda Lt Chains,Ur: 28.6 mg/L — ABNORMAL HIGH (ref 0.24–6.66)
Free Lt Chn Excr Rate: 184 mg/L — ABNORMAL HIGH (ref 1.35–24.19)
GAMMA GLOBULIN URINE: 13.6 %
Total Protein, Urine: 371.3 mg/dL

## 2015-07-15 LAB — GLUCOSE, CAPILLARY
Glucose-Capillary: 124 mg/dL — ABNORMAL HIGH (ref 65–99)
Glucose-Capillary: 138 mg/dL — ABNORMAL HIGH (ref 65–99)
Glucose-Capillary: 164 mg/dL — ABNORMAL HIGH (ref 65–99)
Glucose-Capillary: 195 mg/dL — ABNORMAL HIGH (ref 65–99)

## 2015-07-15 LAB — HEMOGLOBIN A1C
Hgb A1c MFr Bld: 6.6 % — ABNORMAL HIGH (ref 4.8–5.6)
Mean Plasma Glucose: 143 mg/dL

## 2015-07-15 LAB — RENAL FUNCTION PANEL
Albumin: 2.5 g/dL — ABNORMAL LOW (ref 3.5–5.0)
Anion gap: 9 (ref 5–15)
BUN: 45 mg/dL — ABNORMAL HIGH (ref 6–20)
CO2: 18 mmol/L — ABNORMAL LOW (ref 22–32)
Calcium: 8.2 mg/dL — ABNORMAL LOW (ref 8.9–10.3)
Chloride: 111 mmol/L (ref 101–111)
Creatinine, Ser: 4.03 mg/dL — ABNORMAL HIGH (ref 0.44–1.00)
GFR calc Af Amer: 14 mL/min — ABNORMAL LOW (ref >60)
GFR calc non Af Amer: 12 mL/min — ABNORMAL LOW (ref >60)
Glucose, Bld: 197 mg/dL — ABNORMAL HIGH (ref 65–99)
Phosphorus: 4.7 mg/dL — ABNORMAL HIGH (ref 2.5–4.6)
Potassium: 5.3 mmol/L — ABNORMAL HIGH (ref 3.5–5.1)
Sodium: 138 mmol/L (ref 135–145)

## 2015-07-15 LAB — PROTEIN ELECTROPHORESIS, SERUM
A/G Ratio: 0.8 (ref 0.7–1.7)
Albumin ELP: 2.5 g/dL — ABNORMAL LOW (ref 2.9–4.4)
Alpha-1-Globulin: 0.2 g/dL (ref 0.0–0.4)
Alpha-2-Globulin: 1.1 g/dL — ABNORMAL HIGH (ref 0.4–1.0)
Beta Globulin: 0.9 g/dL (ref 0.7–1.3)
Gamma Globulin: 0.8 g/dL (ref 0.4–1.8)
Globulin, Total: 3 g/dL (ref 2.2–3.9)
Total Protein ELP: 5.5 g/dL — ABNORMAL LOW (ref 6.0–8.5)

## 2015-07-15 MED ORDER — SODIUM BICARBONATE 650 MG PO TABS: 1300.0000 mg | ORAL_TABLET | Freq: Two times a day (BID) | ORAL | Status: DC

## 2015-07-15 NOTE — Telephone Encounter (Signed)
Error entry

## 2015-07-15 NOTE — Discharge Summary (Signed)
Discharge Summary  [name] [id number] DOB: [birth year]  PCP: Maggie Font, MD  Admit date: 07/13/2015 Anticipated Discharge date: 07/16/2015  Time spent: 25 minutes  Recommendations for Outpatient Follow-up:  1. Patient will follow up with vascular surgery as outpatient for fistula placement 2. New medication: Bicarbonate 1300 mg by mouth twice a day 3. Patient will follow-up with her ophthalmologist as scheduled  Discharge Diagnoses:  Active Hospital Problems   Diagnosis Date Noted  . Acute on chronic renal failure 07/13/2015  . Hyperkalemia 08/29/2012  . Leukocytosis 08/21/2012  . Alcohol abuse 12/23/2011  . Diabetes mellitus type 2, uncontrolled 09/01/2011    Resolved Hospital Problems   Diagnosis Date Noted Date Resolved  No resolved problems to display.    Discharge Condition: Improved, being discharged home  Diet recommendation: Low potassium diet  Filed Weights   07/13/15 1201 07/13/15 2100  Weight: 85.276 kg (188 lb) 91.082 kg (200 lb 12.8 oz)    History of present illness:  49 year old female with past medical history of diabetes mellitus, stage IV chronic kidney disease who over the last week, noted to have increasing creatinine from 3.6 up to 4.2 and then came into the emergency room on 8/7 with 2 months of visual complaints of seeing floaters and found to have a creatinine now at 4.65. Seen by nephrology and etiology unclear. Not felt to be secondary to offending medication.  Hospital Course:  Active Problems:   Diabetes mellitus type 2, uncontrolled with diabetic nephropathy: CBG stayed under 200 during hospitalization   Alcohol abuse: No signs of withdrawal. Patient does admit to drinking several ounces of liquor per week. Placed on ciwa protocol which was not needed   Leukocytosis: Felt to be secondary stress margination, resolved as renal function improved   Hyperkalemia: Recommendation for low potassium diet   Acute on stage IV chronic renal  failure: Patient started on IV fluids and bicarbonate and by 8/9, creatinine down to 4.03. She was cleared for discharge by nephrology. Seen by vascular surgery recommending vein mapping and fistula placement and given patient's stability, this can be done as outpatient Visual disturbances: Given that these have been going on for several months, not felt to be indicative any acute eye emergency. No eye pain. Patient will follow-up with her ophthalmologist as outpatient   Procedures:  None  Consultations:  Nephrology  Vascular surgery  Discharge Exam: BP 131/77 mmHg  Pulse 91  Temp(Src) 98.6 F (37 C) (Oral)  Resp 18  Ht 5\' 5"  (1.651 m)  Wt 91.082 kg (200 lb 12.8 oz)  BMI 33.41 kg/m2  SpO2 100%  LMP 07/04/2015  General: Alert and oriented 3, no acute distress Cardiovascular: Regular rate and rhythm, S1-S2 Respiratory: Clear to auscultation bilaterally  Discharge Instructions You were cared for by a hospitalist during your hospital stay. If you have any questions about your discharge medications or the care you received while you were in the hospital after you are discharged, you can call the unit and asked to speak with the hospitalist on call if the hospitalist that took care of you is not available. Once you are discharged, your primary care physician will handle any further medical issues. Please note that NO REFILLS for any discharge medications will be authorized once you are discharged, as it is imperative that you return to your primary care physician (or establish a relationship with a primary care physician if you do not have one) for your aftercare needs so that they can reassess your need  for medications and monitor your lab values.  Discharge Instructions    Diet low potassium    Complete by:  As directed      Increase activity slowly    Complete by:  As directed             Medication List    TAKE these medications        amLODipine 10 MG tablet  Commonly  known as:  NORVASC  Take 1 tablet (10 mg total) by mouth daily.     atorvastatin 20 MG tablet  Commonly known as:  LIPITOR  Take 20 mg by mouth daily.     calcitRIOL 0.25 MCG capsule  Commonly known as:  ROCALTROL  Take 0.25 mcg by mouth daily.     furosemide 40 MG tablet  Commonly known as:  LASIX  Take 40 mg by mouth 2 (two) times daily.     gabapentin 300 MG capsule  Commonly known as:  NEURONTIN  Take 1 capsule (300 mg total) by mouth 3 (three) times daily.     HUMULIN 70/30 KWIKPEN [place]  Inject 10 Units into the skin 2 (two) times daily.     losartan 50 MG tablet  Commonly known as:  COZAAR  Take 100 mg by mouth daily.     meclizine 25 MG tablet  Commonly known as:  ANTIVERT  Take 50 mg by mouth 2 (two) times daily as needed for dizziness.     sodium bicarbonate 650 MG tablet  Take 2 tablets (1,300 mg total) by mouth 2 (two) times daily.       No Known Allergies    The results of significant diagnostics from this hospitalization (including imaging, microbiology, ancillary and laboratory) are listed below for reference.    Significant Diagnostic Studies: US Renal  07/14/2015   CLINICAL DATA:  Acute renal failure  EXAM: RENAL / URINARY TRACT ULTRASOUND COMPLETE  COMPARISON:  Ultrasound 01/10/2012  FINDINGS: Right Kidney:  Length: 11.1 cm. Echogenicity within normal limits. No mass or hydronephrosis visualized.  Left Kidney:  Length: 12.1 cm. Echogenicity within normal limits. No mass or hydronephrosis visualized.  Bladder:  Appears normal for degree of bladder distention.  IMPRESSION: Negative   Electronically Signed   By: Franchot Gallo M.D.   On: 07/14/2015 10:37    Microbiology: No results found for this or any previous visit (from the past 240 hour(s)).   Labs: Basic Metabolic Panel:  Recent Labs Lab 07/13/15 1325 07/14/15 0648 07/15/15 0953  NA 138 140 138  K 5.4* 5.7* 5.3*  CL 111 113* 111  CO2 17* 16* 18*  GLUCOSE 182* 134* 197*  BUN 59* 53* 45*    CREATININE 4.65* 4.17* 4.03*  CALCIUM 8.2* 7.8* 8.2*  PHOS 5.8* 5.2* 4.7*   Liver Function Tests:  Recent Labs Lab 07/14/15 0648 07/15/15 0953  ALBUMIN 2.4* 2.5*   No results for input(s): LIPASE, AMYLASE in the last 168 hours. No results for input(s): AMMONIA in the last 168 hours. CBC:  Recent Labs Lab 07/13/15 1325 07/14/15 0648 07/15/15 0953  WBC 12.0* 10.1 10.8*  NEUTROABS 9.0*  --   --   HGB 9.2* 7.9* 8.5*  HCT 29.7* 25.4* 28.2*  MCV 72.6* 71.8* 72.1*  PLT 368 249 342   Cardiac Enzymes: No results for input(s): CKTOTAL, CKMB, CKMBINDEX, TROPONINI in the last 168 hours. BNP: BNP (last 3 results) No results for input(s): BNP in the last 8760 hours.  ProBNP (last 3 results) No results  for input(s): PROBNP in the last 8760 hours.  CBG:  Recent Labs Lab 07/14/15 1742 07/14/15 2251 07/15/15 0806 07/15/15 1126 07/15/15 1637  GLUCAP 122* 120* 124* 138* 164*       Signed:  Chinyere Galiano K  [facility] 07/15/2015, 8:22 PM

## 2015-07-15 NOTE — Care Management Important Message (Signed)
Important Message  Patient Details  Name: ABBAGAIL HILINSKI MRN: [id number] Date of Birth: 1966/11/17   Medicare Important Message Given:  Yes-second notification given    Delorse Lek 07/15/2015, 2:18 PM

## 2015-07-15 NOTE — Progress Notes (Signed)
S: Denies uremic Sxs.  Says she has appt with her eye dr this afternoon O:BP 140/71 mmHg  Pulse 98  Temp(Src) 98.5 F (36.9 C) (Oral)  Resp 18  Ht 5\' 5"  (1.651 m)  Wt 91.082 kg (200 lb 12.8 oz)  BMI 33.41 kg/m2  SpO2 100%  LMP 07/04/2015  Intake/Output Summary (Last 24 hours) at 07/15/15 1106 Last data filed at 07/15/15 0914  Gross per 24 hour  Intake    720 ml  Output      0 ml  Net    720 ml   Weight change:  [id number] and alert CVS:RRR Resp:clear Abd:+ BS NTND Ext: Lt BKA, 0-tr edema on RT NEURO:Ox3 no asterixis   . amLODipine  10 mg Oral Daily  . atorvastatin  20 mg Oral Daily  . calcitRIOL  0.25 mcg Oral Daily  . carvedilol  6.25 mg Oral BID WC  . docusate sodium  100 mg Oral BID  . folic acid  1 mg Oral Daily  . furosemide  40 mg Oral BID  . gabapentin  300 mg Oral QHS  . heparin  5,000 Units Subcutaneous 3 times per day  . insulin aspart  0-9 Units Subcutaneous TID WC  . insulin detemir  10 Units Subcutaneous QHS  . LORazepam  0-4 mg Intravenous Q6H   Followed by  . LORazepam  0-4 mg Intravenous Q12H  . multivitamin  1 tablet Oral QHS  . sodium bicarbonate  1,300 mg Oral BID  . thiamine  100 mg Oral Daily   Or  . thiamine  100 mg Intravenous Daily   US Renal  07/14/2015   CLINICAL DATA:  Acute renal failure  EXAM: RENAL / URINARY TRACT ULTRASOUND COMPLETE  COMPARISON:  Ultrasound 01/10/2012  FINDINGS: Right Kidney:  Length: 11.1 cm. Echogenicity within normal limits. No mass or hydronephrosis visualized.  Left Kidney:  Length: 12.1 cm. Echogenicity within normal limits. No mass or hydronephrosis visualized.  Bladder:  Appears normal for degree of bladder distention.  IMPRESSION: Negative   Electronically Signed   By: Franchot Gallo M.D.   On: 07/14/2015 10:37   BMET    Component Value Date/Time   NA 138 07/15/2015 0953   NA 139 09/12/2013 0736   K 5.3* 07/15/2015 0953   K 5.2* 11/07/2013 0802   CL 111 07/15/2015 0953   CL 111* 09/12/2013 0736   CO2  18* 07/15/2015 0953   CO2 21 09/12/2013 0736   GLUCOSE 197* 07/15/2015 0953   GLUCOSE 181* 09/12/2013 0736   BUN 45* 07/15/2015 0953   BUN 35* 09/12/2013 0736   CREATININE 4.03* 07/15/2015 0953   CREATININE 1.55* 09/12/2013 0736   CREATININE 0.94 09/12/2012 1113   CALCIUM 8.2* 07/15/2015 0953   CALCIUM 7.8* 09/12/2013 0736   GFRNONAA 12* 07/15/2015 0953   GFRNONAA 40* 09/12/2013 0736   GFRNONAA 74 09/12/2012 1113   GFRAA 14* 07/15/2015 0953   GFRAA 46* 09/12/2013 0736   GFRAA 85 09/12/2012 1113   CBC    Component Value Date/Time   WBC 10.8* 07/15/2015 0953   WBC 9.4 06/06/2013 1244   RBC 3.91 07/15/2015 0953   RBC 3.95 06/06/2013 1244   RBC 3.17* 01/09/2012 1630   HGB 8.5* 07/15/2015 0953   HGB 9.2* 09/12/2013 0736   HCT 28.2* 07/15/2015 0953   HCT 28.8* 06/06/2013 1244   PLT 342 07/15/2015 0953   PLT 359 06/06/2013 1244   MCV 72.1* 07/15/2015 0953   MCV 73* 06/06/2013 1244  MCH 21.7* 07/15/2015 0953   MCH 23.1* 06/06/2013 1244   MCHC 30.1 07/15/2015 0953   MCHC 31.6* 06/06/2013 1244   RDW 17.5* 07/15/2015 0953   RDW 14.5 06/06/2013 1244   LYMPHSABS 2.0 07/13/2015 1325   LYMPHSABS 1.9 06/06/2013 1244   MONOABS 0.5 07/13/2015 1325   MONOABS 0.5 06/06/2013 1244   EOSABS 0.5 07/13/2015 1325   EOSABS 0.2 06/06/2013 1244   BASOSABS 0.1 07/13/2015 1325   BASOSABS 0.1 06/06/2013 1244     Assessment:  1. Acute on CKD 4, Scr at her baseline 2. Anemia with iron def 3. HTN 4. DM 5. Hyperkalemia 6. Sec HPTH on calcitriol Plan: 1. OK to go from renal standpoint 2.  Dc on oral bicarb 3. Low K diet   Keelee Yankey T

## 2015-07-16 LAB — RENAL FUNCTION PANEL
Albumin: 2.4 g/dL — ABNORMAL LOW (ref 3.5–5.0)
Anion gap: 8 (ref 5–15)
BUN: 49 mg/dL — ABNORMAL HIGH (ref 6–20)
CO2: 17 mmol/L — ABNORMAL LOW (ref 22–32)
Calcium: 7.9 mg/dL — ABNORMAL LOW (ref 8.9–10.3)
Chloride: 113 mmol/L — ABNORMAL HIGH (ref 101–111)
Creatinine, Ser: 4.23 mg/dL — ABNORMAL HIGH (ref 0.44–1.00)
GFR calc Af Amer: 13 mL/min — ABNORMAL LOW (ref >60)
GFR calc non Af Amer: 12 mL/min — ABNORMAL LOW (ref >60)
Glucose, Bld: 97 mg/dL (ref 65–99)
Phosphorus: 5.5 mg/dL — ABNORMAL HIGH (ref 2.5–4.6)
Potassium: 5.6 mmol/L — ABNORMAL HIGH (ref 3.5–5.1)
Sodium: 138 mmol/L (ref 135–145)

## 2015-07-16 LAB — CBC
HCT: 26.9 % — ABNORMAL LOW (ref 36.0–46.0)
Hemoglobin: 8.2 g/dL — ABNORMAL LOW (ref 12.0–15.0)
MCH: 22.3 pg — ABNORMAL LOW (ref 26.0–34.0)
MCHC: 30.5 g/dL (ref 30.0–36.0)
MCV: 73.3 fL — ABNORMAL LOW (ref 78.0–100.0)
Platelets: 321 10*3/uL (ref 150–400)
RBC: 3.67 MIL/uL — ABNORMAL LOW (ref 3.87–5.11)
RDW: 17.5 % — ABNORMAL HIGH (ref 11.5–15.5)
WBC: 11.6 10*3/uL — ABNORMAL HIGH (ref 4.0–10.5)

## 2015-07-16 LAB — GLUCOSE, CAPILLARY: Glucose-Capillary: 168 mg/dL — ABNORMAL HIGH (ref 65–99)

## 2015-07-16 NOTE — Progress Notes (Signed)
Discharged home with family in stable condition.

## 2015-07-17 ENCOUNTER — Other Ambulatory Visit: Payer: Self-pay

## 2015-07-17 DIAGNOSIS — E11359 Type 2 diabetes mellitus with proliferative diabetic retinopathy without macular edema: Secondary | ICD-10-CM | POA: Diagnosis not present

## 2015-07-21 ENCOUNTER — Encounter (HOSPITAL_COMMUNITY): Payer: Self-pay | Admitting: Emergency Medicine

## 2015-07-21 MED ORDER — DEXTROSE 5 % IV SOLN
1.5000 g | INTRAVENOUS | Status: AC
  Administered 2015-07-22: 1.5 g via INTRAVENOUS
  Filled 2015-07-21: qty 1.5

## 2015-07-21 MED ORDER — SODIUM CHLORIDE 0.9 % IV SOLN
INTRAVENOUS | Status: DC
  Administered 2015-07-22: 11:00:00 via INTRAVENOUS
  Administered 2015-07-22: 10 mL/h via INTRAVENOUS

## 2015-07-21 MED ORDER — CHLORHEXIDINE GLUCONATE CLOTH 2 % EX PADS: 6.0000 | MEDICATED_PAD | Freq: Once | CUTANEOUS | Status: DC

## 2015-07-21 NOTE — Progress Notes (Signed)
Pt denies SOB, chest pain, and being under the care of a cardiologist. Pt denies having a stress test, echo and cardiac cath. Pt denies having a chest x ray within the last year. Levada Dy, NP, anesthesia asked to review pt EKG. Pt made aware to stop taking otc vitamins and herbal medications. Do not take any NSAIDs ie: Ibuprofen, Advil, Naproxen , BC's, Goody's and etc.

## 2015-07-21 NOTE — Progress Notes (Signed)
Anesthesia Chart Review:  Pt is 49 year old female scheduled for L basilic vein fistula vs AV graft insertion on 07/22/2015 with Dr. Oneida Alar.   Pt is same day work up.   PMH includes: HTN, DM, anemia, alcohol dependency, CKD (stage 4). Never smoker. BMI 33.5. S/p R BKA 08/25/12. S/p L finger amputation 01/29/12. S/p irrigation and debridement L thumb 12/26/11, 12/24/11, and 12/23/11.   Pt hospitalized 8/7-8/10/16 for acute on chronic renal failure, hyperkalemia.   Medications include: amlodipine, lipitor, calcitriol, lasix, humulin 70/30, losartan, sodium bicarb.   Labs from recent hospitalization 07/16/15 reviewed. K 5.6. Cr 4.23, BUN 49. Calcium 7.9. H/H 8.2/26.9.   EKG 07/13/2015: Sinus tachycardia (101 bpm). Septal infarct, age undetermined.   If no changes, I anticipate pt can proceed with surgery as scheduled.   Willeen Cass, FNP-BC [facility]/Anesthesiology Phone: [phone] 07/21/2015 4:45 PM

## 2015-07-22 ENCOUNTER — Ambulatory Visit (HOSPITAL_COMMUNITY): Payer: Medicare Other | Admitting: Emergency Medicine

## 2015-07-22 ENCOUNTER — Encounter (HOSPITAL_COMMUNITY): Payer: Self-pay | Admitting: *Deleted

## 2015-07-22 ENCOUNTER — Encounter (HOSPITAL_COMMUNITY)
Admission: RE | Disposition: A | Discharge status: Home / Self Care | Payer: Self-pay | Source: Ambulatory Visit | Attending: Vascular Surgery

## 2015-07-22 ENCOUNTER — Ambulatory Visit (HOSPITAL_COMMUNITY)
Admission: RE | Admit: 2015-07-22 | Discharge: 2015-07-22 | Disposition: A | Discharge status: Home / Self Care | Payer: Medicare Other | Source: Ambulatory Visit | Attending: Vascular Surgery | Admitting: Vascular Surgery

## 2015-07-22 DIAGNOSIS — N185 Chronic kidney disease, stage 5: Secondary | ICD-10-CM | POA: Diagnosis not present

## 2015-07-22 DIAGNOSIS — Z89022 Acquired absence of left finger(s): Secondary | ICD-10-CM | POA: Diagnosis not present

## 2015-07-22 DIAGNOSIS — Z89511 Acquired absence of right leg below knee: Secondary | ICD-10-CM | POA: Diagnosis not present

## 2015-07-22 DIAGNOSIS — D649 Anemia, unspecified: Secondary | ICD-10-CM | POA: Diagnosis not present

## 2015-07-22 DIAGNOSIS — F1020 Alcohol dependence, uncomplicated: Secondary | ICD-10-CM | POA: Insufficient documentation

## 2015-07-22 DIAGNOSIS — E1122 Type 2 diabetes mellitus with diabetic chronic kidney disease: Secondary | ICD-10-CM | POA: Diagnosis not present

## 2015-07-22 DIAGNOSIS — Z6833 Body mass index (BMI) 33.0-33.9, adult: Secondary | ICD-10-CM | POA: Diagnosis not present

## 2015-07-22 DIAGNOSIS — Z79899 Other long term (current) drug therapy: Secondary | ICD-10-CM | POA: Diagnosis not present

## 2015-07-22 DIAGNOSIS — N184 Chronic kidney disease, stage 4 (severe): Secondary | ICD-10-CM | POA: Diagnosis not present

## 2015-07-22 DIAGNOSIS — I120 Hypertensive chronic kidney disease with stage 5 chronic kidney disease or end stage renal disease: Secondary | ICD-10-CM | POA: Diagnosis not present

## 2015-07-22 DIAGNOSIS — Z89011 Acquired absence of right thumb: Secondary | ICD-10-CM | POA: Diagnosis not present

## 2015-07-22 DIAGNOSIS — E11319 Type 2 diabetes mellitus with unspecified diabetic retinopathy without macular edema: Secondary | ICD-10-CM | POA: Diagnosis not present

## 2015-07-22 DIAGNOSIS — N186 End stage renal disease: Secondary | ICD-10-CM | POA: Diagnosis not present

## 2015-07-22 HISTORY — DX: Nausea with vomiting, unspecified: R11.2

## 2015-07-22 HISTORY — DX: Nausea with vomiting, unspecified: Z98.890

## 2015-07-22 HISTORY — PX: BASCILIC VEIN TRANSPOSITION: SHX5742

## 2015-07-22 HISTORY — DX: Legal blindness, as defined in USA: H54.8

## 2015-07-22 HISTORY — DX: Chronic kidney disease, unspecified: N18.9

## 2015-07-22 LAB — GLUCOSE, CAPILLARY
Glucose-Capillary: 123 mg/dL — ABNORMAL HIGH (ref 65–99)
Glucose-Capillary: 105 mg/dL — ABNORMAL HIGH (ref 65–99)

## 2015-07-22 LAB — POCT I-STAT 4, (NA,K, GLUC, HGB,HCT)
Glucose, Bld: 128 mg/dL — ABNORMAL HIGH (ref 65–99)
HCT: 26 % — ABNORMAL LOW (ref 36.0–46.0)
Hemoglobin: 8.8 g/dL — ABNORMAL LOW (ref 12.0–15.0)
Potassium: 5.2 mmol/L — ABNORMAL HIGH (ref 3.5–5.1)
Sodium: 136 mmol/L (ref 135–145)

## 2015-07-22 LAB — HCG, SERUM, QUALITATIVE: Preg, Serum: NEGATIVE

## 2015-07-22 SURGERY — TRANSPOSITION, VEIN, BASILIC
Anesthesia: General | Site: Arm Upper | Laterality: Left

## 2015-07-22 MED ORDER — FENTANYL CITRATE (PF) 250 MCG/5ML IJ SOLN
INTRAMUSCULAR | Status: AC
  Filled 2015-07-22: qty 5

## 2015-07-22 MED ORDER — SODIUM CHLORIDE 0.9 % IR SOLN
Status: DC | PRN
  Administered 2015-07-22: 12:00:00

## 2015-07-22 MED ORDER — LIDOCAINE HCL (CARDIAC) 20 MG/ML IV SOLN
INTRAVENOUS | Status: DC | PRN
  Administered 2015-07-22: 60 mg via INTRAVENOUS

## 2015-07-22 MED ORDER — FENTANYL CITRATE (PF) 100 MCG/2ML IJ SOLN
25.0000 ug | INTRAMUSCULAR | Status: DC | PRN
  Administered 2015-07-22: 25 ug via INTRAVENOUS

## 2015-07-22 MED ORDER — PROPOFOL 10 MG/ML IV BOLUS
INTRAVENOUS | Status: AC
  Filled 2015-07-22: qty 20

## 2015-07-22 MED ORDER — PROMETHAZINE HCL 25 MG/ML IJ SOLN: 6.2500 mg | INTRAMUSCULAR | Status: DC | PRN

## 2015-07-22 MED ORDER — SODIUM CHLORIDE 0.9 % IJ SOLN
INTRAMUSCULAR | Status: AC
  Filled 2015-07-22: qty 10

## 2015-07-22 MED ORDER — OXYCODONE HCL 5 MG PO TABS
ORAL_TABLET | ORAL | Status: AC
  Filled 2015-07-22: qty 1

## 2015-07-22 MED ORDER — OXYCODONE HCL 5 MG PO TABS
5.0000 mg | ORAL_TABLET | Freq: Once | ORAL | Status: AC
  Administered 2015-07-22: 5 mg via ORAL

## 2015-07-22 MED ORDER — FENTANYL CITRATE (PF) 100 MCG/2ML IJ SOLN
INTRAMUSCULAR | Status: DC | PRN
  Administered 2015-07-22 (×5): 50 ug via INTRAVENOUS

## 2015-07-22 MED ORDER — FENTANYL CITRATE (PF) 100 MCG/2ML IJ SOLN
INTRAMUSCULAR | Status: AC
  Filled 2015-07-22: qty 2

## 2015-07-22 MED ORDER — MIDAZOLAM HCL 2 MG/2ML IJ SOLN
INTRAMUSCULAR | Status: AC
  Filled 2015-07-22: qty 4

## 2015-07-22 MED ORDER — HEPARIN SODIUM (PORCINE) 1000 UNIT/ML IJ SOLN
INTRAMUSCULAR | Status: AC
  Filled 2015-07-22: qty 1

## 2015-07-22 MED ORDER — OXYCODONE HCL 5 MG PO TABS: 5.0000 mg | ORAL_TABLET | Freq: Four times a day (QID) | ORAL | Status: DC | PRN

## 2015-07-22 MED ORDER — EPHEDRINE SULFATE 50 MG/ML IJ SOLN
INTRAMUSCULAR | Status: DC | PRN
  Administered 2015-07-22: 5 mg via INTRAVENOUS

## 2015-07-22 MED ORDER — PROTAMINE SULFATE 10 MG/ML IV SOLN
INTRAVENOUS | Status: DC | PRN
  Administered 2015-07-22: 50 mg via INTRAVENOUS

## 2015-07-22 MED ORDER — PROPOFOL 10 MG/ML IV BOLUS
INTRAVENOUS | Status: DC | PRN
  Administered 2015-07-22: 200 mg via INTRAVENOUS
  Administered 2015-07-22: 50 mg via INTRAVENOUS

## 2015-07-22 MED ORDER — THROMBIN 20000 UNITS EX SOLR
CUTANEOUS | Status: AC
  Filled 2015-07-22: qty 20000

## 2015-07-22 MED ORDER — OXYCODONE-ACETAMINOPHEN 5-325 MG PO TABS: 1.0000 | ORAL_TABLET | ORAL | Status: DC | PRN

## 2015-07-22 MED ORDER — EPHEDRINE SULFATE 50 MG/ML IJ SOLN
INTRAMUSCULAR | Status: AC
  Filled 2015-07-22: qty 1

## 2015-07-22 MED ORDER — PROTAMINE SULFATE 10 MG/ML IV SOLN
INTRAVENOUS | Status: AC
  Filled 2015-07-22: qty 5

## 2015-07-22 MED ORDER — ONDANSETRON HCL 4 MG/2ML IJ SOLN
INTRAMUSCULAR | Status: DC | PRN
  Administered 2015-07-22: 4 mg via INTRAVENOUS

## 2015-07-22 MED ORDER — 0.9 % SODIUM CHLORIDE (POUR BTL) OPTIME
TOPICAL | Status: DC | PRN
  Administered 2015-07-22: 1000 mL

## 2015-07-22 MED ORDER — HEPARIN SODIUM (PORCINE) 1000 UNIT/ML IJ SOLN
INTRAMUSCULAR | Status: DC | PRN
  Administered 2015-07-22: 5000 [IU] via INTRAVENOUS

## 2015-07-22 MED ORDER — MIDAZOLAM HCL 5 MG/5ML IJ SOLN
INTRAMUSCULAR | Status: DC | PRN
  Administered 2015-07-22 (×2): 1 mg via INTRAVENOUS

## 2015-07-22 SURGICAL SUPPLY — 33 items
CANISTER SUCTION 2500CC (MISCELLANEOUS) ×2 IMPLANT
CANNULA VESSEL 3MM 2 BLNT TIP (CANNULA) ×1 IMPLANT
CLIP TI MEDIUM 24 (CLIP) ×2 IMPLANT
CLIP TI WIDE RED SMALL 24 (CLIP) ×2 IMPLANT
COVER PROBE W GEL 5X96 (DRAPES) ×2 IMPLANT
DECANTER SPIKE VIAL GLASS SM (MISCELLANEOUS) ×2 IMPLANT
ELECT REM PT RETURN 9FT ADLT (ELECTROSURGICAL) ×2
ELECTRODE REM PT RTRN 9FT ADLT (ELECTROSURGICAL) ×1 IMPLANT
GLOVE BIO SURGEON STRL SZ 6.5 (GLOVE) ×2 IMPLANT
GLOVE BIO SURGEON STRL SZ7.5 (GLOVE) ×4 IMPLANT
GLOVE BIOGEL PI IND STRL 7.0 (GLOVE) IMPLANT
GLOVE BIOGEL PI IND STRL 8 (GLOVE) IMPLANT
GLOVE BIOGEL PI INDICATOR 7.0 (GLOVE) ×3
GLOVE BIOGEL PI INDICATOR 8 (GLOVE) ×2
GLOVE ECLIPSE 6.5 STRL STRAW (GLOVE) ×1 IMPLANT
GOWN STRL REUS W/ TWL LRG LVL3 (GOWN DISPOSABLE) ×3 IMPLANT
GOWN STRL REUS W/ TWL XL LVL3 (GOWN DISPOSABLE) IMPLANT
GOWN STRL REUS W/TWL LRG LVL3 (GOWN DISPOSABLE) ×6
GOWN STRL REUS W/TWL XL LVL3 (GOWN DISPOSABLE) ×4
KIT BASIN OR (CUSTOM PROCEDURE TRAY) ×2 IMPLANT
KIT ROOM TURNOVER OR (KITS) ×2 IMPLANT
LIQUID BAND (GAUZE/BANDAGES/DRESSINGS) ×3 IMPLANT
NS IRRIG 1000ML POUR BTL (IV SOLUTION) ×2 IMPLANT
PACK CV ACCESS (CUSTOM PROCEDURE TRAY) ×2 IMPLANT
PAD ARMBOARD 7.5X6 YLW CONV (MISCELLANEOUS) ×4 IMPLANT
SUT PROLENE 7 0 BV 1 (SUTURE) ×3 IMPLANT
SUT SILK 2 0 SH (SUTURE) ×2 IMPLANT
SUT VIC AB 3-0 SH 27 (SUTURE) ×6
SUT VIC AB 3-0 SH 27X BRD (SUTURE) ×1 IMPLANT
SUT VICRYL 4-0 PS2 18IN ABS (SUTURE) ×4 IMPLANT
TAPE UMBILICAL COTTON 1/8X30 (MISCELLANEOUS) ×1 IMPLANT
UNDERPAD 30X30 INCONTINENT (UNDERPADS AND DIAPERS) ×2 IMPLANT
WATER STERILE IRR 1000ML POUR (IV SOLUTION) ×2 IMPLANT

## 2015-07-22 NOTE — Anesthesia Postprocedure Evaluation (Signed)
  Anesthesia Post-op Note  Patient: [name]  Procedure(s) Performed: Procedure(s): BASILIC VEIN TRANSPOSITION  (Left)  Patient Location: PACU  Anesthesia Type:General  Level of Consciousness: awake and alert   Airway and Oxygen Therapy: Patient Spontanous Breathing  Post-op Pain: none  Post-op Assessment: Post-op Vital signs reviewed              Post-op Vital Signs: Reviewed  Last Vitals:  Filed Vitals:   07/22/15 1545  BP: 134/70  Pulse: 89  Temp:   Resp:     Complications: No apparent anesthesia complications

## 2015-07-22 NOTE — Discharge Instructions (Signed)
° ° °  07/22/2015 [id number] 1966/04/29  Surgeon(s): Elam Dutch, MD  Procedure(s): Left BASILIC VEIN TRANSPOSITION   x Do not stick fistula for 12 weeks

## 2015-07-22 NOTE — Anesthesia Preprocedure Evaluation (Addendum)
Anesthesia Evaluation  Patient identified by MRN, date of birth, ID band Patient awake    Reviewed: Allergy & Precautions, NPO status , Patient's Chart, lab work & pertinent test results  History of Anesthesia Complications (+) PONV  Airway Mallampati: II  TM Distance: >3 FB Neck ROM: Full    Dental  (+) Edentulous Upper, Edentulous Lower   Pulmonary neg pulmonary ROS,  breath sounds clear to auscultation        Cardiovascular hypertension, Pt. on medications Rhythm:Regular Rate:Normal     Neuro/Psych negative neurological ROS     GI/Hepatic negative GI ROS, Neg liver ROS,   Endo/Other  diabetes, Type 2, Insulin DependentMorbid obesity  Renal/GU CRFRenal disease     Musculoskeletal   Abdominal   Peds  Hematology  (+) anemia ,   Anesthesia Other Findings   Reproductive/Obstetrics                           Lab Results  Component Value Date   CREATININE 4.23* 07/16/2015   BUN 49* 07/16/2015   NA 138 07/16/2015   K 5.6* 07/16/2015   CL 113* 07/16/2015   CO2 17* 07/16/2015   Lab Results  Component Value Date   WBC 11.6* 07/16/2015   HGB 8.2* 07/16/2015   HCT 26.9* 07/16/2015   MCV 73.3* 07/16/2015   PLT 321 07/16/2015    Anesthesia Physical Anesthesia Plan  ASA: III  Anesthesia Plan: General   Post-op Pain Management:    Induction: Intravenous  Airway Management Planned: LMA  Additional Equipment:   Intra-op Plan:   Post-operative Plan: Extubation in OR  Informed Consent: I have reviewed the patients History and Physical, chart, labs and discussed the procedure including the risks, benefits and alternatives for the proposed anesthesia with the patient or authorized representative who has indicated his/her understanding and acceptance.   Dental advisory given  Plan Discussed with: CRNA  Anesthesia Plan Comments:        Anesthesia Quick Evaluation

## 2015-07-22 NOTE — H&P (View-Only) (Signed)
V[facility] and [place]  Reason for consult: permanent access Consulting physician: Dr. Mercy Moore  History of Present Illness  [name] is a 49 y.o. (24-Jun-1966) female who presents for evaluation for permanent access. She has CKD stage IV not yet requiring hemodialysis. The patient is right hand dominant.  The patient has not had previous access procedures.  She has never required temporary dialysis. She does not have a pacemaker.   She presented to the [facility] ED yesterday after having worsening vision changes in her right eye. 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 She has a right BKA due to infected diabetic foot ulcer.   Past Medical History  Diagnosis Date  . Diabetes mellitus type 2, uncontrolled   . Hypertension   . Anemia   . Alcohol dependency   . Diabetes mellitus     type 2  . Necrotizing fasciitis     Past Surgical History  Procedure Laterality Date  . Amputation      right first and left middle finger amputation 2/2 OM 08/2011 by Dr. Amedeo Plenty  . Cesarean section    . I&d left thumb  12/23/2011  . Tubal ligation    . I&d extremity  12/24/2011    Procedure: IRRIGATION AND DEBRIDEMENT EXTREMITY;  Surgeon: Tennis Must, MD;  Location: [place];  Service: Orthopedics;  Laterality: Left;  I&Dleft thumb with partial amputation  . I&d extremity  12/26/2011    Procedure: IRRIGATION AND DEBRIDEMENT EXTREMITY;  Surgeon: Tennis Must, MD;  Location: [place];  Service: Orthopedics;  Laterality: Left;  . I&d extremity  12/23/2011    Procedure: IRRIGATION AND DEBRIDEMENT EXTREMITY;  Surgeon: Tennis Must, MD;  Location: [employer] [facility];  Service: Orthopedics;  Laterality: Left;  I&D Left thumb, hand and arm  . Amputation  01/18/2012    Procedure: AMPUTATION DIGIT;  Surgeon: Tennis Must, MD;  Location: [place];  Service: Orthopedics;  Laterality: Left;  revision  amputation left thumb  . Amputation  08/25/2012    Procedure: AMPUTATION BELOW KNEE;  Surgeon: Newt Minion, MD;  Location: [facility];  Service: Orthopedics;  Laterality: Right;  Right Below Knee Amputation    History   Social History  . Marital Status: Single    Spouse Name: N/A  . Number of Children: 3  . Years of Education: N/A   Occupational History  .     Social History Main Topics  . Smoking status: Never Smoker   . Smokeless tobacco: Never Used  . Alcohol Use: 0.6 oz/week    1 Cans of beer per week     Comment: h/o alcohol abuse  . Drug Use: No  . Sexual Activity: Yes   Other Topics Concern  . Not on file   Social History Narrative             Family History  Problem Relation Age of Onset  . Diabetes Mother   . Diabetes Father   . Kidney failure Sister   . Diabetes Sister   . High blood pressure Sister     No current facility-administered medications on file prior to encounter.   Current Outpatient Prescriptions on File Prior to Encounter  Medication Sig Dispense Refill  . amLODipine (NORVASC) 10 MG tablet Take 1 tablet (10 mg total) by mouth daily.    [name] gabapentin (NEURONTIN) 300 MG capsule Take 1 capsule (300 mg  total) by mouth 3 (three) times daily.      No Known Allergies   REVIEW OF SYSTEMS:  (Positives checked otherwise negative)  CARDIOVASCULAR:  []  chest pain, []  chest pressure, []  palpitations, []  shortness of breath when laying flat, []  shortness of breath with exertion,  []  pain in feet when walking, []  pain in feet when laying flat, []  history of blood clot in veins (DVT), []  history of phlebitis, []  swelling in legs, []  varicose veins  PULMONARY:  []  productive cough, []  asthma, []  wheezing  NEUROLOGIC:  []  weakness in arms or legs, []  numbness in arms or legs, []  difficulty speaking or slurred speech, []  temporary loss of vision in one eye, []  dizziness  HEMATOLOGIC:  []  bleeding problems, []  problems with blood clotting too  easily  MUSCULOSKEL:  []  joint pain, []  joint swelling  GASTROINTEST:  []  vomiting blood, []  blood in stool     GENITOURINARY:  []  burning with urination, []  blood in urine  PSYCHIATRIC:  []  history of major depression  INTEGUMENTARY:  []  rashes, []  ulcers  CONSTITUTIONAL:  []  fever, []  chills  Physical Examination  Filed Vitals:   07/13/15 1704 07/13/15 2100 07/14/15 0500 07/14/15 0759  BP: 167/82 135/77 150/79 154/84  Pulse: 103 97 94 94  Temp: 98.8 F (37.1 C) 98.3 F (36.8 C) 98.7 F (37.1 C) 98.7 F (37.1 C)  TempSrc: Oral Oral Oral Oral  Resp: 16 16 16 18   Height:  5\' 5"  (1.651 m)    Weight:  200 lb 12.8 oz (91.082 kg)    SpO2: 100% 99% 98% 100%   Body mass index is 33.41 kg/(m^2).  General: A&O x 3, WD obese female in NAd  Head: [place]/AT  Neck: Supple  Pulmonary: Sym exp, good air movt, CTAB, no rales, rhonchi, & wheezing  Cardiac: RRR, Nl S1, S2, no Murmurs, rubs or gallops  Vascular: palpable 2+ radial and brachial pulses b/l. Right BKA.   Musculoskeletal: No muscle wasting or atrophy.    Neurologic: No focal deficits. Blind left eye.   Psychiatric: Judgment intact, Mood & affect appropriate for pt's clinical situation  Dermatologic: See M/S exam for extremity exam, no rashes otherwise noted  Non-Invasive Vascular Imaging  Vein Mapping  (Date: 07/14/2015) pending   Medical Decision Making  [name] is a 49 y.o. female who presents with CKD stage IV not requiring hemodialysis.   Await results of vein mapping. Patient is right handed.   I had an extensive discussion with this patient in regards to the nature of access surgery, including risk, benefits, and alternatives.    Patient agreeable to procedure. Plan for OR sometime this week. May need to wait until later this week due to OR availability.   Dr. Oneida Alar to see patient.    Virgina Jock, PA-C [facility] [place] Office: [phone] Pager:  [phone]  07/14/2015, 2:55 PM   History and exam details as above Pt has had bilateral prior finger amputations but does have a radial pulse bilaterally Left basilic vein may be adequate for fistula right arm veins and left cephalic vein are not Needs all IVs removed from left arm.  Plan is for left basilic vein fistula versus AV graft on Wednesday or Thursday this week.  This can be done as outpt if pt ready for d/c.  Risks benefits possible complications procedure details discussed with pt including but not limited to bleeding infection steal non maturation graft thrombosis.  She wishes to  proceed.  Ruta Hinds, MD [facility] [place] Office: [phone] Pager: [phone]

## 2015-07-22 NOTE — Transfer of Care (Signed)
Immediate Anesthesia Transfer of Care Note  Patient: [name]  Procedure(s) Performed: Procedure(s): BASILIC VEIN TRANSPOSITION  (Left)  Patient Location: PACU  Anesthesia Type:General  Level of Consciousness: awake, oriented, sedated, patient cooperative and responds to stimulation  Airway & Oxygen Therapy: Patient Spontanous Breathing and Patient connected to nasal cannula oxygen  Post-op Assessment: Report given to RN, Post -op Vital signs reviewed and stable, Patient moving all extremities and Patient moving all extremities X 4  Post vital signs: Reviewed and stable  Last Vitals:  Filed Vitals:   07/22/15 0859  BP: 147/68  Pulse: 94  Temp: 36.3 C  Resp: 20    Complications: No apparent anesthesia complications

## 2015-07-22 NOTE — Op Note (Signed)
Procedure: Left basilic vein transposition fistula  Preoperative diagnosis: End-stage renal disease  Postoperative diagnosis: Same  Anesthesia: Gen.  Assistant: Leontine Locket PA-C  Operative findings: 3-5 mm left basilic vein, 3 mm left brachial artery  Operative details: After obtaining informed consent, the patient was taken to the operating room. The patient was placed in supine position operating table. After induction of general anesthesia, the patient's entire left upper extremity was prepped and draped in the usual sterile fashion. Next ultrasound was used to identify the left basilic vein. A longitudinal incision was made just above the antecubital crease in order to expose the basilic vein. The vein was of good quality approximately 3-5 mm in diameter throughout its course. Several longitudinal skip incisions were made up the arm from just below the antecubital crease all the way up to the axilla to harvest the basilic vein. Care was taken to try to not injure any sensory nerves and all the motor nerves were identified and protected. The vein was dissected free circumferentially and small side branches ligated and divided between silk ties or clips. Next the brachial artery was exposed through and additional incision above the antecubital crease. The artery was approximately 3 mm in diameter. This was dissected free circumferentially. Vessel loops were placed around it. There was some spasm within the artery after dissecting it free. Next the distal basilic vein was ligated with a 2 0 silk tie and the vein transected. The vein was brought out throughout the skip incisions and gently distended with heparinized saline and marked for orientation. The vein was then tunneled subcutaneously in an arcing configuration out over the biceps muscle down to the level of the exposed brachial artery just above the antecubital crease. The patient was given 5000 units of intravenous heparin. Vessel loops were  used to control the artery proximally and distally. The vein was cut to length and sewn end of vein to side of artery using a running 7-0 Prolene suture. Just prior completion of the anastomosis, it was forebled backbled and thoroughly flushed. The anastomosis was secured Vesseloops were released.   Initially the flow was fairly sluggish due to spasm. However there was a palpable thrill after a few minutes of the proximal aspect of the fistula. There was also good Doppler flow throughout the course of the fistula. The distal vein was also noted to fill fully. At this point all subcutaneous tissues were reapproximated using running 3-0 Vicryl suture. All skin incisions were closed with running 4  0 Vicryl subcuticular stitch. Dermabond was applied to all incisions. The patient tolerated the procedure well and there were no complications. Instrument sponge needle counts were correct at the end of the case. The patient was taken to the recovery room in stable condition.  The patient had and audible radial doppler signal at the end of the case.  Ruta Hinds, MD [facility] [place] Office: [phone] Pager: [phone]

## 2015-07-22 NOTE — Interval H&P Note (Signed)
History and Physical Interval Note:  07/22/2015 10:46 AM  [name]  has presented today for surgery, with the diagnosis of Stage IV Chronic Kidney Disease N18.4  The various methods of treatment have been discussed with the patient and family. After consideration of risks, benefits and other options for treatment, the patient has consented to  Procedure(s): BASILIC VEIN FISTULA VERSUS ARTERIOVENOUS GRAFT INSERTION (Left) as a surgical intervention .  The patient's history has been reviewed, patient examined, no change in status, stable for surgery.  I have reviewed the patient's chart and labs.  Questions were answered to the patient's satisfaction.     Ruta Hinds

## 2015-07-22 NOTE — Anesthesia Procedure Notes (Signed)
Procedure Name: LMA Insertion Date/Time: 07/22/2015 11:51 AM Performed by: Jacquiline Doe A Pre-anesthesia Checklist: Patient identified, Timeout performed, Emergency Drugs available, Suction available and Patient being monitored Patient Re-evaluated:Patient Re-evaluated prior to inductionOxygen Delivery Method: Circle system utilized Preoxygenation: Pre-oxygenation with 100% oxygen Intubation Type: IV induction Ventilation: Mask ventilation without difficulty LMA: LMA inserted LMA Size: 4.0 Tube type: Oral Number of attempts: 1 Placement Confirmation: breath sounds checked- equal and bilateral and positive ETCO2 Tube secured with: Tape Dental Injury: Teeth and Oropharynx as per pre-operative assessment

## 2015-07-23 ENCOUNTER — Encounter (HOSPITAL_COMMUNITY): Payer: Self-pay | Admitting: Vascular Surgery

## 2015-07-28 ENCOUNTER — Telehealth: Payer: Self-pay | Admitting: Vascular Surgery

## 2015-07-28 NOTE — Telephone Encounter (Signed)
-----   Message from Mena Goes, RN sent at 07/22/2015  2:34 PM EDT ----- Regarding: Schedule   ----- Message -----    From: Ulyses Amor, PA-C    Sent: 07/22/2015   1:53 PM      To: [facility] Charge Pool  S/P AV fistula transposition f/u with Dr. Oneida Alar in 4 weeks

## 2015-07-28 NOTE — Telephone Encounter (Signed)
Unable to reach pt at #'s listed. Mailed letter, dpm

## 2015-08-01 ENCOUNTER — Inpatient Hospital Stay (HOSPITAL_COMMUNITY): Admission: RE | Admit: 2015-08-01 | Payer: Medicare Other | Source: Ambulatory Visit

## 2015-08-14 ENCOUNTER — Ambulatory Visit (HOSPITAL_COMMUNITY)
Admission: RE | Admit: 2015-08-14 | Discharge: 2015-08-14 | Disposition: A | Discharge status: Home / Self Care | Payer: Medicare Other | Source: Ambulatory Visit | Attending: Nephrology | Admitting: Nephrology

## 2015-08-14 DIAGNOSIS — D631 Anemia in chronic kidney disease: Secondary | ICD-10-CM | POA: Diagnosis not present

## 2015-08-14 DIAGNOSIS — N184 Chronic kidney disease, stage 4 (severe): Secondary | ICD-10-CM | POA: Insufficient documentation

## 2015-08-14 DIAGNOSIS — Z5181 Encounter for therapeutic drug level monitoring: Secondary | ICD-10-CM | POA: Insufficient documentation

## 2015-08-14 DIAGNOSIS — Z79899 Other long term (current) drug therapy: Secondary | ICD-10-CM | POA: Insufficient documentation

## 2015-08-14 LAB — FERRITIN: Ferritin: 57 ng/mL (ref 11–307)

## 2015-08-14 LAB — RENAL FUNCTION PANEL
Albumin: 2.8 g/dL — ABNORMAL LOW (ref 3.5–5.0)
Anion gap: 9 (ref 5–15)
BUN: 54 mg/dL — ABNORMAL HIGH (ref 6–20)
CO2: 18 mmol/L — ABNORMAL LOW (ref 22–32)
Calcium: 7.9 mg/dL — ABNORMAL LOW (ref 8.9–10.3)
Chloride: 108 mmol/L (ref 101–111)
Creatinine, Ser: 5.36 mg/dL — ABNORMAL HIGH (ref 0.44–1.00)
GFR calc Af Amer: 10 mL/min — ABNORMAL LOW (ref >60)
GFR calc non Af Amer: 9 mL/min — ABNORMAL LOW (ref >60)
Glucose, Bld: 169 mg/dL — ABNORMAL HIGH (ref 65–99)
Phosphorus: 5.3 mg/dL — ABNORMAL HIGH (ref 2.5–4.6)
Potassium: 6.4 mmol/L (ref 3.5–5.1)
Sodium: 135 mmol/L (ref 135–145)

## 2015-08-14 LAB — IRON AND TIBC
Iron: 69 ug/dL (ref 28–170)
Saturation Ratios: 22 % (ref 10.4–31.8)
TIBC: 311 ug/dL (ref 250–450)
UIBC: 242 ug/dL

## 2015-08-14 LAB — POCT HEMOGLOBIN-HEMACUE: Hemoglobin: 7.6 g/dL — ABNORMAL LOW (ref 12.0–15.0)

## 2015-08-14 MED ORDER — DARBEPOETIN ALFA 150 MCG/0.3ML IJ SOSY
150.0000 ug | PREFILLED_SYRINGE | INTRAMUSCULAR | Status: DC
  Administered 2015-08-14: 150 ug via SUBCUTANEOUS

## 2015-08-14 MED ORDER — DARBEPOETIN ALFA 150 MCG/0.3ML IJ SOSY
PREFILLED_SYRINGE | INTRAMUSCULAR | Status: AC
  Administered 2015-08-14: 150 ug via SUBCUTANEOUS
  Filled 2015-08-14: qty 0.3

## 2015-08-18 DIAGNOSIS — I10 Essential (primary) hypertension: Secondary | ICD-10-CM | POA: Diagnosis not present

## 2015-08-18 DIAGNOSIS — N185 Chronic kidney disease, stage 5: Secondary | ICD-10-CM | POA: Diagnosis not present

## 2015-08-18 DIAGNOSIS — E1122 Type 2 diabetes mellitus with diabetic chronic kidney disease: Secondary | ICD-10-CM | POA: Diagnosis not present

## 2015-08-18 DIAGNOSIS — E875 Hyperkalemia: Secondary | ICD-10-CM | POA: Diagnosis not present

## 2015-08-19 ENCOUNTER — Encounter: Payer: Self-pay | Admitting: Family

## 2015-08-21 ENCOUNTER — Encounter: Payer: Medicare Other | Admitting: Vascular Surgery

## 2015-08-25 DIAGNOSIS — E875 Hyperkalemia: Secondary | ICD-10-CM | POA: Diagnosis not present

## 2015-09-04 DIAGNOSIS — D631 Anemia in chronic kidney disease: Secondary | ICD-10-CM | POA: Diagnosis not present

## 2015-09-04 DIAGNOSIS — N185 Chronic kidney disease, stage 5: Secondary | ICD-10-CM | POA: Diagnosis not present

## 2015-09-04 DIAGNOSIS — I10 Essential (primary) hypertension: Secondary | ICD-10-CM | POA: Diagnosis not present

## 2015-09-04 DIAGNOSIS — N2581 Secondary hyperparathyroidism of renal origin: Secondary | ICD-10-CM | POA: Diagnosis not present

## 2015-09-09 ENCOUNTER — Encounter: Payer: Self-pay | Admitting: Vascular Surgery

## 2015-09-11 ENCOUNTER — Encounter: Payer: Self-pay | Admitting: Vascular Surgery

## 2015-09-11 ENCOUNTER — Ambulatory Visit (INDEPENDENT_AMBULATORY_CARE_PROVIDER_SITE_OTHER): Payer: Medicare Other | Admitting: Vascular Surgery

## 2015-09-11 ENCOUNTER — Encounter (HOSPITAL_COMMUNITY): Payer: Medicare Other

## 2015-09-11 VITALS — BP 157/87 | HR 106 | Temp 98.5°F | Ht 65.0 in | Wt 188.0 lb

## 2015-09-11 DIAGNOSIS — N184 Chronic kidney disease, stage 4 (severe): Secondary | ICD-10-CM

## 2015-09-11 NOTE — Addendum Note (Signed)
Addended by: Dorthula Rue L on: 09/11/2015 04:40 PM   Modules accepted: Orders

## 2015-09-11 NOTE — Progress Notes (Signed)
Patient is a 49 year old female who returns for postoperative follow-up today. She would left basilic vein transposition fistula on August 16. She denies any numbness or tingling in her hand. She is currently not on hemodialysis. She has had amputation of her left thumb in the remote past for infection.  Physical exam:  Well-healed left upper extremity incisions palpable thrill audible bruit in fistula no palpable left radial pulse hand is warm and well-perfused   Assessment: Patent maturing left basilic vein transposition fistula.  Plan: Patient will follow-up in 6 weeks for duplex ultrasound that time to assess maturity.  Ruta Hinds, MD [facility] [place] Office: [phone] Pager: [phone]

## 2015-09-16 ENCOUNTER — Other Ambulatory Visit (HOSPITAL_COMMUNITY): Payer: Self-pay | Admitting: *Deleted

## 2015-09-17 ENCOUNTER — Ambulatory Visit (HOSPITAL_COMMUNITY)
Admission: RE | Admit: 2015-09-17 | Discharge: 2015-09-17 | Disposition: A | Discharge status: Home / Self Care | Payer: Medicare Other | Source: Ambulatory Visit | Attending: Nephrology | Admitting: Nephrology

## 2015-09-17 DIAGNOSIS — D631 Anemia in chronic kidney disease: Secondary | ICD-10-CM | POA: Insufficient documentation

## 2015-09-17 DIAGNOSIS — N184 Chronic kidney disease, stage 4 (severe): Secondary | ICD-10-CM | POA: Insufficient documentation

## 2015-09-17 DIAGNOSIS — Z79899 Other long term (current) drug therapy: Secondary | ICD-10-CM | POA: Insufficient documentation

## 2015-09-17 DIAGNOSIS — D509 Iron deficiency anemia, unspecified: Secondary | ICD-10-CM | POA: Insufficient documentation

## 2015-09-17 DIAGNOSIS — Z5181 Encounter for therapeutic drug level monitoring: Secondary | ICD-10-CM | POA: Insufficient documentation

## 2015-09-17 MED ORDER — DARBEPOETIN ALFA 200 MCG/0.4ML IJ SOSY
PREFILLED_SYRINGE | INTRAMUSCULAR | Status: AC
  Filled 2015-09-17: qty 0.4

## 2015-09-17 MED ORDER — SODIUM CHLORIDE 0.9 % IV SOLN
510.0000 mg | INTRAVENOUS | Status: DC
  Administered 2015-09-17: 510 mg via INTRAVENOUS
  Filled 2015-09-17: qty 17

## 2015-09-17 MED ORDER — DARBEPOETIN ALFA 200 MCG/0.4ML IJ SOSY
200.0000 ug | PREFILLED_SYRINGE | INTRAMUSCULAR | Status: DC
  Administered 2015-09-17: 200 ug via SUBCUTANEOUS

## 2015-09-18 LAB — POCT HEMOGLOBIN-HEMACUE: Hemoglobin: 7.6 g/dL — ABNORMAL LOW (ref 12.0–15.0)

## 2015-10-13 DIAGNOSIS — N185 Chronic kidney disease, stage 5: Secondary | ICD-10-CM | POA: Diagnosis not present

## 2015-10-13 DIAGNOSIS — Z23 Encounter for immunization: Secondary | ICD-10-CM | POA: Diagnosis not present

## 2015-10-13 DIAGNOSIS — I10 Essential (primary) hypertension: Secondary | ICD-10-CM | POA: Diagnosis not present

## 2015-10-13 DIAGNOSIS — D631 Anemia in chronic kidney disease: Secondary | ICD-10-CM | POA: Diagnosis not present

## 2015-10-13 DIAGNOSIS — N2581 Secondary hyperparathyroidism of renal origin: Secondary | ICD-10-CM | POA: Diagnosis not present

## 2015-10-13 DIAGNOSIS — N189 Chronic kidney disease, unspecified: Secondary | ICD-10-CM | POA: Diagnosis not present

## 2015-10-14 ENCOUNTER — Other Ambulatory Visit (HOSPITAL_COMMUNITY): Payer: Self-pay | Admitting: *Deleted

## 2015-10-15 ENCOUNTER — Ambulatory Visit (HOSPITAL_COMMUNITY)
Admission: RE | Admit: 2015-10-15 | Discharge: 2015-10-15 | Disposition: A | Discharge status: Home / Self Care | Payer: Medicare Other | Source: Ambulatory Visit | Attending: Nephrology | Admitting: Nephrology

## 2015-10-15 DIAGNOSIS — N184 Chronic kidney disease, stage 4 (severe): Secondary | ICD-10-CM | POA: Insufficient documentation

## 2015-10-15 DIAGNOSIS — Z5181 Encounter for therapeutic drug level monitoring: Secondary | ICD-10-CM | POA: Insufficient documentation

## 2015-10-15 DIAGNOSIS — D631 Anemia in chronic kidney disease: Secondary | ICD-10-CM | POA: Insufficient documentation

## 2015-10-15 DIAGNOSIS — Z79899 Other long term (current) drug therapy: Secondary | ICD-10-CM | POA: Diagnosis not present

## 2015-10-15 DIAGNOSIS — D509 Iron deficiency anemia, unspecified: Secondary | ICD-10-CM | POA: Diagnosis not present

## 2015-10-15 LAB — IRON AND TIBC
Iron: 39 ug/dL (ref 28–170)
Saturation Ratios: 13 % (ref 10.4–31.8)
TIBC: 307 ug/dL (ref 250–450)
UIBC: 268 ug/dL

## 2015-10-15 LAB — FERRITIN: Ferritin: 78 ng/mL (ref 11–307)

## 2015-10-15 LAB — POCT HEMOGLOBIN-HEMACUE: Hemoglobin: 7.9 g/dL — ABNORMAL LOW (ref 12.0–15.0)

## 2015-10-15 LAB — RENAL FUNCTION PANEL
Albumin: 2.9 g/dL — ABNORMAL LOW (ref 3.5–5.0)
Anion gap: 8 (ref 5–15)
BUN: 61 mg/dL — ABNORMAL HIGH (ref 6–20)
CO2: 17 mmol/L — ABNORMAL LOW (ref 22–32)
Calcium: 7.8 mg/dL — ABNORMAL LOW (ref 8.9–10.3)
Chloride: 113 mmol/L — ABNORMAL HIGH (ref 101–111)
Creatinine, Ser: 5.54 mg/dL — ABNORMAL HIGH (ref 0.44–1.00)
GFR calc Af Amer: 10 mL/min — ABNORMAL LOW (ref >60)
GFR calc non Af Amer: 8 mL/min — ABNORMAL LOW (ref >60)
Glucose, Bld: 121 mg/dL — ABNORMAL HIGH (ref 65–99)
Phosphorus: 4.8 mg/dL — ABNORMAL HIGH (ref 2.5–4.6)
Potassium: 6.2 mmol/L (ref 3.5–5.1)
Sodium: 138 mmol/L (ref 135–145)

## 2015-10-15 MED ORDER — DARBEPOETIN ALFA 200 MCG/0.4ML IJ SOSY
200.0000 ug | PREFILLED_SYRINGE | INTRAMUSCULAR | Status: DC
  Administered 2015-10-15: 200 ug via SUBCUTANEOUS

## 2015-10-15 MED ORDER — DARBEPOETIN ALFA 200 MCG/0.4ML IJ SOSY
PREFILLED_SYRINGE | INTRAMUSCULAR | Status: AC
  Filled 2015-10-15: qty 0.4

## 2015-10-15 MED ORDER — SODIUM CHLORIDE 0.9 % IV SOLN
510.0000 mg | Freq: Once | INTRAVENOUS | Status: AC
  Administered 2015-10-15: 510 mg via INTRAVENOUS
  Filled 2015-10-15: qty 17

## 2015-10-16 LAB — PTH, INTACT AND CALCIUM
Calcium, Total (PTH): 10.1 mg/dL (ref 8.7–10.2)
PTH: 186 pg/mL — ABNORMAL HIGH (ref 15–65)

## 2015-10-20 DIAGNOSIS — E1122 Type 2 diabetes mellitus with diabetic chronic kidney disease: Secondary | ICD-10-CM | POA: Diagnosis not present

## 2015-10-20 DIAGNOSIS — I10 Essential (primary) hypertension: Secondary | ICD-10-CM | POA: Diagnosis not present

## 2015-10-20 DIAGNOSIS — N185 Chronic kidney disease, stage 5: Secondary | ICD-10-CM | POA: Diagnosis not present

## 2015-10-21 ENCOUNTER — Encounter: Payer: Self-pay | Admitting: Vascular Surgery

## 2015-10-23 ENCOUNTER — Ambulatory Visit (HOSPITAL_COMMUNITY)
Admission: RE | Admit: 2015-10-23 | Discharge: 2015-10-23 | Disposition: A | Discharge status: Home / Self Care | Payer: Medicare Other | Source: Ambulatory Visit | Attending: Vascular Surgery | Admitting: Vascular Surgery

## 2015-10-23 ENCOUNTER — Encounter: Payer: Self-pay | Admitting: Vascular Surgery

## 2015-10-23 ENCOUNTER — Ambulatory Visit (INDEPENDENT_AMBULATORY_CARE_PROVIDER_SITE_OTHER): Payer: Medicare Other | Admitting: Vascular Surgery

## 2015-10-23 VITALS — BP 162/84 | HR 100 | Ht 65.0 in | Wt 188.0 lb

## 2015-10-23 DIAGNOSIS — N186 End stage renal disease: Secondary | ICD-10-CM

## 2015-10-23 DIAGNOSIS — N184 Chronic kidney disease, stage 4 (severe): Secondary | ICD-10-CM

## 2015-10-23 DIAGNOSIS — Z992 Dependence on renal dialysis: Secondary | ICD-10-CM

## 2015-10-23 NOTE — Progress Notes (Signed)
She is a 49 year old female returns for postoperative follow-up after recent left basilic vein transposition fistula. She denies any numbness or tingling in her hand. She currently is dialyzing via a catheter. Her primary nephrologist is Dr. Joelyn Oms. Her fistula was placed 07/22/2015.  Physical exam:  Filed Vitals:   10/23/15 1140  BP: 162/84  Pulse: 100  Height: 5\' 5"  (1.651 m)  Weight: 188 lb (85.276 kg)  SpO2: 100%   Left upper extremity: Palpable thrill fistula is easily palpable throughout the upper arm.  Data: Patient had a duplex ultrasound of her AV fistula today. Diameter was 6 mm throughout most of the fistula and 3 mm from the skin.  Assessment: Maturing fistula left arm. Should be ready for use cannulation on December 1. Patient will follow-up on as-needed basis.  Ruta Hinds, MD [facility] [place] Office: [phone] Pager: [phone]

## 2015-11-12 ENCOUNTER — Encounter (HOSPITAL_COMMUNITY): Payer: Medicare Other

## 2015-11-24 ENCOUNTER — Encounter (HOSPITAL_COMMUNITY)
Admission: RE | Admit: 2015-11-24 | Discharge: 2015-11-24 | Disposition: A | Discharge status: Home / Self Care | Payer: Medicare Other | Source: Ambulatory Visit | Attending: Nephrology | Admitting: Nephrology

## 2015-11-24 DIAGNOSIS — Z5181 Encounter for therapeutic drug level monitoring: Secondary | ICD-10-CM | POA: Diagnosis not present

## 2015-11-24 DIAGNOSIS — N184 Chronic kidney disease, stage 4 (severe): Secondary | ICD-10-CM | POA: Diagnosis not present

## 2015-11-24 DIAGNOSIS — Z79899 Other long term (current) drug therapy: Secondary | ICD-10-CM | POA: Insufficient documentation

## 2015-11-24 DIAGNOSIS — D631 Anemia in chronic kidney disease: Secondary | ICD-10-CM | POA: Insufficient documentation

## 2015-11-24 LAB — RENAL FUNCTION PANEL
Albumin: 3.2 g/dL — ABNORMAL LOW (ref 3.5–5.0)
Anion gap: 12 (ref 5–15)
BUN: 70 mg/dL — ABNORMAL HIGH (ref 6–20)
CO2: 16 mmol/L — ABNORMAL LOW (ref 22–32)
Calcium: 8.3 mg/dL — ABNORMAL LOW (ref 8.9–10.3)
Chloride: 110 mmol/L (ref 101–111)
Creatinine, Ser: 5.4 mg/dL — ABNORMAL HIGH (ref 0.44–1.00)
GFR calc Af Amer: 10 mL/min — ABNORMAL LOW (ref >60)
GFR calc non Af Amer: 9 mL/min — ABNORMAL LOW (ref >60)
Glucose, Bld: 100 mg/dL — ABNORMAL HIGH (ref 65–99)
Phosphorus: 6.1 mg/dL — ABNORMAL HIGH (ref 2.5–4.6)
Potassium: 5.6 mmol/L — ABNORMAL HIGH (ref 3.5–5.1)
Sodium: 138 mmol/L (ref 135–145)

## 2015-11-24 LAB — IRON AND TIBC
Iron: 51 ug/dL (ref 28–170)
Saturation Ratios: 16 % (ref 10.4–31.8)
TIBC: 309 ug/dL (ref 250–450)
UIBC: 258 ug/dL

## 2015-11-24 LAB — POCT HEMOGLOBIN-HEMACUE: Hemoglobin: 8.9 g/dL — ABNORMAL LOW (ref 12.0–15.0)

## 2015-11-24 LAB — FERRITIN: Ferritin: 110 ng/mL (ref 11–307)

## 2015-11-24 MED ORDER — DARBEPOETIN ALFA 200 MCG/0.4ML IJ SOSY
200.0000 ug | PREFILLED_SYRINGE | INTRAMUSCULAR | Status: DC
  Administered 2015-11-24: 200 ug via SUBCUTANEOUS

## 2015-11-24 MED ORDER — DARBEPOETIN ALFA 200 MCG/0.4ML IJ SOSY
PREFILLED_SYRINGE | INTRAMUSCULAR | Status: AC
  Administered 2015-11-24: 200 ug via SUBCUTANEOUS
  Filled 2015-11-24: qty 0.4

## 2015-12-09 DIAGNOSIS — I770 Arteriovenous fistula, acquired: Secondary | ICD-10-CM | POA: Diagnosis not present

## 2015-12-09 DIAGNOSIS — I10 Essential (primary) hypertension: Secondary | ICD-10-CM | POA: Diagnosis not present

## 2015-12-09 DIAGNOSIS — I739 Peripheral vascular disease, unspecified: Secondary | ICD-10-CM | POA: Diagnosis not present

## 2015-12-09 DIAGNOSIS — E872 Acidosis: Secondary | ICD-10-CM | POA: Diagnosis not present

## 2015-12-09 DIAGNOSIS — N185 Chronic kidney disease, stage 5: Secondary | ICD-10-CM | POA: Diagnosis not present

## 2015-12-09 DIAGNOSIS — N2581 Secondary hyperparathyroidism of renal origin: Secondary | ICD-10-CM | POA: Diagnosis not present

## 2015-12-09 DIAGNOSIS — D631 Anemia in chronic kidney disease: Secondary | ICD-10-CM | POA: Diagnosis not present

## 2015-12-19 ENCOUNTER — Other Ambulatory Visit (HOSPITAL_COMMUNITY): Payer: Self-pay | Admitting: *Deleted

## 2015-12-22 ENCOUNTER — Ambulatory Visit (HOSPITAL_COMMUNITY)
Admission: RE | Admit: 2015-12-22 | Discharge: 2015-12-22 | Disposition: A | Discharge status: Home / Self Care | Payer: Medicare Other | Source: Ambulatory Visit | Attending: Nephrology | Admitting: Nephrology

## 2015-12-22 DIAGNOSIS — Z79899 Other long term (current) drug therapy: Secondary | ICD-10-CM | POA: Insufficient documentation

## 2015-12-22 DIAGNOSIS — N184 Chronic kidney disease, stage 4 (severe): Secondary | ICD-10-CM | POA: Insufficient documentation

## 2015-12-22 DIAGNOSIS — D631 Anemia in chronic kidney disease: Secondary | ICD-10-CM | POA: Insufficient documentation

## 2015-12-22 DIAGNOSIS — Z5181 Encounter for therapeutic drug level monitoring: Secondary | ICD-10-CM | POA: Insufficient documentation

## 2015-12-22 LAB — RENAL FUNCTION PANEL
Albumin: 3.5 g/dL (ref 3.5–5.0)
Anion gap: 10 (ref 5–15)
BUN: 65 mg/dL — ABNORMAL HIGH (ref 6–20)
CO2: 20 mmol/L — ABNORMAL LOW (ref 22–32)
Calcium: 8.6 mg/dL — ABNORMAL LOW (ref 8.9–10.3)
Chloride: 109 mmol/L (ref 101–111)
Creatinine, Ser: 5.55 mg/dL — ABNORMAL HIGH (ref 0.44–1.00)
GFR calc Af Amer: 10 mL/min — ABNORMAL LOW (ref >60)
GFR calc non Af Amer: 8 mL/min — ABNORMAL LOW (ref >60)
Glucose, Bld: 121 mg/dL — ABNORMAL HIGH (ref 65–99)
Phosphorus: 5.4 mg/dL — ABNORMAL HIGH (ref 2.5–4.6)
Potassium: 5 mmol/L (ref 3.5–5.1)
Sodium: 139 mmol/L (ref 135–145)

## 2015-12-22 LAB — IRON AND TIBC
Iron: 47 ug/dL (ref 28–170)
Saturation Ratios: 14 % (ref 10.4–31.8)
TIBC: 336 ug/dL (ref 250–450)
UIBC: 289 ug/dL

## 2015-12-22 LAB — FERRITIN: Ferritin: 94 ng/mL (ref 11–307)

## 2015-12-22 LAB — POCT HEMOGLOBIN-HEMACUE: Hemoglobin: 10.2 g/dL — ABNORMAL LOW (ref 12.0–15.0)

## 2015-12-22 MED ORDER — DARBEPOETIN ALFA 200 MCG/0.4ML IJ SOSY
PREFILLED_SYRINGE | INTRAMUSCULAR | Status: AC
  Filled 2015-12-22: qty 0.4

## 2015-12-22 MED ORDER — DARBEPOETIN ALFA 200 MCG/0.4ML IJ SOSY
200.0000 ug | PREFILLED_SYRINGE | INTRAMUSCULAR | Status: DC
  Administered 2015-12-22: 200 ug via SUBCUTANEOUS

## 2016-01-19 DIAGNOSIS — I770 Arteriovenous fistula, acquired: Secondary | ICD-10-CM | POA: Diagnosis not present

## 2016-01-19 DIAGNOSIS — D631 Anemia in chronic kidney disease: Secondary | ICD-10-CM | POA: Diagnosis not present

## 2016-01-19 DIAGNOSIS — N185 Chronic kidney disease, stage 5: Secondary | ICD-10-CM | POA: Diagnosis not present

## 2016-01-19 DIAGNOSIS — E872 Acidosis: Secondary | ICD-10-CM | POA: Diagnosis not present

## 2016-01-19 DIAGNOSIS — N2581 Secondary hyperparathyroidism of renal origin: Secondary | ICD-10-CM | POA: Diagnosis not present

## 2016-01-19 DIAGNOSIS — I10 Essential (primary) hypertension: Secondary | ICD-10-CM | POA: Diagnosis not present

## 2016-01-20 ENCOUNTER — Inpatient Hospital Stay (HOSPITAL_COMMUNITY): Admission: RE | Admit: 2016-01-20 | Payer: Medicare Other | Source: Ambulatory Visit

## 2016-01-29 ENCOUNTER — Other Ambulatory Visit (HOSPITAL_COMMUNITY): Payer: Self-pay

## 2016-01-30 ENCOUNTER — Ambulatory Visit (HOSPITAL_COMMUNITY)
Admission: RE | Admit: 2016-01-30 | Discharge: 2016-01-30 | Disposition: A | Discharge status: Home / Self Care | Payer: Medicare Other | Source: Ambulatory Visit | Attending: Nephrology | Admitting: Nephrology

## 2016-01-30 DIAGNOSIS — N184 Chronic kidney disease, stage 4 (severe): Secondary | ICD-10-CM | POA: Insufficient documentation

## 2016-01-30 DIAGNOSIS — Z79899 Other long term (current) drug therapy: Secondary | ICD-10-CM | POA: Diagnosis not present

## 2016-01-30 DIAGNOSIS — D509 Iron deficiency anemia, unspecified: Secondary | ICD-10-CM | POA: Insufficient documentation

## 2016-01-30 DIAGNOSIS — Z5181 Encounter for therapeutic drug level monitoring: Secondary | ICD-10-CM | POA: Diagnosis not present

## 2016-01-30 DIAGNOSIS — D631 Anemia in chronic kidney disease: Secondary | ICD-10-CM | POA: Insufficient documentation

## 2016-01-30 LAB — IRON AND TIBC
Iron: 96 ug/dL (ref 28–170)
Saturation Ratios: 32 % — ABNORMAL HIGH (ref 10.4–31.8)
TIBC: 301 ug/dL (ref 250–450)
UIBC: 205 ug/dL

## 2016-01-30 LAB — FERRITIN: Ferritin: 92 ng/mL (ref 11–307)

## 2016-01-30 LAB — POCT HEMOGLOBIN-HEMACUE: Hemoglobin: 9.1 g/dL — ABNORMAL LOW (ref 12.0–15.0)

## 2016-01-30 MED ORDER — DARBEPOETIN ALFA 200 MCG/0.4ML IJ SOSY
PREFILLED_SYRINGE | INTRAMUSCULAR | Status: DC
Start: 2016-01-30 — End: 2016-01-31
  Filled 2016-01-30: qty 0.4

## 2016-01-30 MED ORDER — SODIUM CHLORIDE 0.9 % IV SOLN
510.0000 mg | INTRAVENOUS | Status: DC
  Administered 2016-01-30: 510 mg via INTRAVENOUS
  Filled 2016-01-30: qty 17

## 2016-01-30 MED ORDER — DARBEPOETIN ALFA 200 MCG/0.4ML IJ SOSY
200.0000 ug | PREFILLED_SYRINGE | INTRAMUSCULAR | Status: DC
  Administered 2016-01-30: 200 ug via SUBCUTANEOUS

## 2016-01-30 NOTE — Progress Notes (Signed)
Lab called pts renal tube was grossly hemolyzed.  Pt has already left.  We will redraw at next appointment

## 2016-02-06 ENCOUNTER — Ambulatory Visit (HOSPITAL_COMMUNITY): Payer: Medicare Other

## 2016-02-10 ENCOUNTER — Other Ambulatory Visit (HOSPITAL_COMMUNITY): Payer: Self-pay | Admitting: *Deleted

## 2016-02-11 ENCOUNTER — Ambulatory Visit (HOSPITAL_COMMUNITY): Admission: RE | Admit: 2016-02-11 | Payer: Medicare Other | Source: Ambulatory Visit

## 2016-02-27 ENCOUNTER — Ambulatory Visit (HOSPITAL_COMMUNITY): Payer: Medicare Other | Attending: Nephrology

## 2016-03-02 ENCOUNTER — Other Ambulatory Visit (HOSPITAL_COMMUNITY): Payer: Self-pay | Admitting: *Deleted

## 2016-03-03 ENCOUNTER — Encounter (HOSPITAL_COMMUNITY)
Admission: RE | Admit: 2016-03-03 | Discharge: 2016-03-03 | Disposition: A | Discharge status: Home / Self Care | Payer: Medicare Other | Source: Ambulatory Visit | Attending: Nephrology | Admitting: Nephrology

## 2016-03-03 DIAGNOSIS — D509 Iron deficiency anemia, unspecified: Secondary | ICD-10-CM | POA: Diagnosis not present

## 2016-03-03 DIAGNOSIS — Z79899 Other long term (current) drug therapy: Secondary | ICD-10-CM | POA: Diagnosis not present

## 2016-03-03 DIAGNOSIS — D631 Anemia in chronic kidney disease: Secondary | ICD-10-CM | POA: Diagnosis not present

## 2016-03-03 DIAGNOSIS — Z5181 Encounter for therapeutic drug level monitoring: Secondary | ICD-10-CM | POA: Insufficient documentation

## 2016-03-03 DIAGNOSIS — N184 Chronic kidney disease, stage 4 (severe): Secondary | ICD-10-CM | POA: Diagnosis not present

## 2016-03-03 LAB — RENAL FUNCTION PANEL
Albumin: 2.7 g/dL — ABNORMAL LOW (ref 3.5–5.0)
Anion gap: 10 (ref 5–15)
BUN: 75 mg/dL — ABNORMAL HIGH (ref 6–20)
CO2: 17 mmol/L — ABNORMAL LOW (ref 22–32)
Calcium: 8 mg/dL — ABNORMAL LOW (ref 8.9–10.3)
Chloride: 108 mmol/L (ref 101–111)
Creatinine, Ser: 6.64 mg/dL — ABNORMAL HIGH (ref 0.44–1.00)
GFR calc Af Amer: 8 mL/min — ABNORMAL LOW (ref >60)
GFR calc non Af Amer: 7 mL/min — ABNORMAL LOW (ref >60)
Glucose, Bld: 115 mg/dL — ABNORMAL HIGH (ref 65–99)
Phosphorus: 6.3 mg/dL — ABNORMAL HIGH (ref 2.5–4.6)
Potassium: 5.1 mmol/L (ref 3.5–5.1)
Sodium: 135 mmol/L (ref 135–145)

## 2016-03-03 LAB — FERRITIN: Ferritin: 232 ng/mL (ref 11–307)

## 2016-03-03 LAB — IRON AND TIBC
Iron: 68 ug/dL (ref 28–170)
Saturation Ratios: 25 % (ref 10.4–31.8)
TIBC: 272 ug/dL (ref 250–450)
UIBC: 204 ug/dL

## 2016-03-03 LAB — POCT HEMOGLOBIN-HEMACUE: Hemoglobin: 8.6 g/dL — ABNORMAL LOW (ref 12.0–15.0)

## 2016-03-03 MED ORDER — DARBEPOETIN ALFA 200 MCG/0.4ML IJ SOSY
PREFILLED_SYRINGE | INTRAMUSCULAR | Status: AC
  Administered 2016-03-03: 200 ug
  Filled 2016-03-03: qty 0.4

## 2016-03-03 MED ORDER — SODIUM CHLORIDE 0.9 % IV SOLN
510.0000 mg | Freq: Once | INTRAVENOUS | Status: AC
  Administered 2016-03-03: 510 mg via INTRAVENOUS
  Filled 2016-03-03: qty 17

## 2016-03-03 MED ORDER — DARBEPOETIN ALFA 200 MCG/0.4ML IJ SOSY: 200.0000 ug | PREFILLED_SYRINGE | INTRAMUSCULAR | Status: DC

## 2016-03-15 DIAGNOSIS — I10 Essential (primary) hypertension: Secondary | ICD-10-CM | POA: Diagnosis not present

## 2016-03-15 DIAGNOSIS — E1122 Type 2 diabetes mellitus with diabetic chronic kidney disease: Secondary | ICD-10-CM | POA: Diagnosis not present

## 2016-03-15 DIAGNOSIS — N185 Chronic kidney disease, stage 5: Secondary | ICD-10-CM | POA: Diagnosis not present

## 2016-03-29 DIAGNOSIS — D631 Anemia in chronic kidney disease: Secondary | ICD-10-CM | POA: Diagnosis not present

## 2016-03-29 DIAGNOSIS — I10 Essential (primary) hypertension: Secondary | ICD-10-CM | POA: Diagnosis not present

## 2016-03-29 DIAGNOSIS — N185 Chronic kidney disease, stage 5: Secondary | ICD-10-CM | POA: Diagnosis not present

## 2016-03-29 DIAGNOSIS — N2581 Secondary hyperparathyroidism of renal origin: Secondary | ICD-10-CM | POA: Diagnosis not present

## 2016-03-29 DIAGNOSIS — E872 Acidosis: Secondary | ICD-10-CM | POA: Diagnosis not present

## 2016-03-31 ENCOUNTER — Encounter (HOSPITAL_COMMUNITY)
Admission: RE | Admit: 2016-03-31 | Discharge: 2016-03-31 | Disposition: A | Discharge status: Home / Self Care | Payer: Medicare Other | Source: Ambulatory Visit | Attending: Nephrology | Admitting: Nephrology

## 2016-03-31 DIAGNOSIS — D631 Anemia in chronic kidney disease: Secondary | ICD-10-CM | POA: Insufficient documentation

## 2016-03-31 DIAGNOSIS — N184 Chronic kidney disease, stage 4 (severe): Secondary | ICD-10-CM | POA: Insufficient documentation

## 2016-03-31 DIAGNOSIS — Z79899 Other long term (current) drug therapy: Secondary | ICD-10-CM | POA: Diagnosis not present

## 2016-03-31 DIAGNOSIS — Z5181 Encounter for therapeutic drug level monitoring: Secondary | ICD-10-CM | POA: Diagnosis not present

## 2016-03-31 LAB — RENAL FUNCTION PANEL
Albumin: 3.1 g/dL — ABNORMAL LOW (ref 3.5–5.0)
Anion gap: 13 (ref 5–15)
BUN: 81 mg/dL — ABNORMAL HIGH (ref 6–20)
CO2: 16 mmol/L — ABNORMAL LOW (ref 22–32)
Calcium: 7.9 mg/dL — ABNORMAL LOW (ref 8.9–10.3)
Chloride: 109 mmol/L (ref 101–111)
Creatinine, Ser: 6.9 mg/dL — ABNORMAL HIGH (ref 0.44–1.00)
GFR calc Af Amer: 7 mL/min — ABNORMAL LOW (ref >60)
GFR calc non Af Amer: 6 mL/min — ABNORMAL LOW (ref >60)
Glucose, Bld: 86 mg/dL (ref 65–99)
Phosphorus: 5.8 mg/dL — ABNORMAL HIGH (ref 2.5–4.6)
Potassium: 5.4 mmol/L — ABNORMAL HIGH (ref 3.5–5.1)
Sodium: 138 mmol/L (ref 135–145)

## 2016-03-31 LAB — POCT HEMOGLOBIN-HEMACUE: Hemoglobin: 10 g/dL — ABNORMAL LOW (ref 12.0–15.0)

## 2016-03-31 LAB — FERRITIN: Ferritin: 295 ng/mL (ref 11–307)

## 2016-03-31 LAB — IRON AND TIBC
Iron: 88 ug/dL (ref 28–170)
Saturation Ratios: 28 % (ref 10.4–31.8)
TIBC: 309 ug/dL (ref 250–450)
UIBC: 221 ug/dL

## 2016-03-31 MED ORDER — DARBEPOETIN ALFA 200 MCG/0.4ML IJ SOSY
200.0000 ug | PREFILLED_SYRINGE | INTRAMUSCULAR | Status: DC
  Administered 2016-03-31: 200 ug via SUBCUTANEOUS

## 2016-03-31 MED ORDER — DARBEPOETIN ALFA 200 MCG/0.4ML IJ SOSY
PREFILLED_SYRINGE | INTRAMUSCULAR | Status: AC
  Filled 2016-03-31: qty 0.4

## 2016-04-01 LAB — PTH, INTACT AND CALCIUM
Calcium, Total (PTH): 7.6 mg/dL — ABNORMAL LOW (ref 8.7–10.2)
PTH: 315 pg/mL — ABNORMAL HIGH (ref 15–65)

## 2016-04-28 ENCOUNTER — Encounter (HOSPITAL_COMMUNITY)
Admission: RE | Admit: 2016-04-28 | Discharge: 2016-04-28 | Disposition: A | Discharge status: Home / Self Care | Payer: Medicare Other | Source: Ambulatory Visit | Attending: Nephrology | Admitting: Nephrology

## 2016-04-28 DIAGNOSIS — D638 Anemia in other chronic diseases classified elsewhere: Secondary | ICD-10-CM | POA: Insufficient documentation

## 2016-04-28 DIAGNOSIS — Z79899 Other long term (current) drug therapy: Secondary | ICD-10-CM | POA: Insufficient documentation

## 2016-04-28 DIAGNOSIS — Z5181 Encounter for therapeutic drug level monitoring: Secondary | ICD-10-CM | POA: Diagnosis not present

## 2016-04-28 DIAGNOSIS — N184 Chronic kidney disease, stage 4 (severe): Secondary | ICD-10-CM | POA: Insufficient documentation

## 2016-04-28 LAB — RENAL FUNCTION PANEL
Albumin: 3.2 g/dL — ABNORMAL LOW (ref 3.5–5.0)
Anion gap: 13 (ref 5–15)
BUN: 81 mg/dL — ABNORMAL HIGH (ref 6–20)
CO2: 16 mmol/L — ABNORMAL LOW (ref 22–32)
Calcium: 9.2 mg/dL (ref 8.9–10.3)
Chloride: 106 mmol/L (ref 101–111)
Creatinine, Ser: 7.79 mg/dL — ABNORMAL HIGH (ref 0.44–1.00)
GFR calc Af Amer: 6 mL/min — ABNORMAL LOW (ref >60)
GFR calc non Af Amer: 5 mL/min — ABNORMAL LOW (ref >60)
Glucose, Bld: 123 mg/dL — ABNORMAL HIGH (ref 65–99)
Phosphorus: 7.8 mg/dL — ABNORMAL HIGH (ref 2.5–4.6)
Potassium: 5.5 mmol/L — ABNORMAL HIGH (ref 3.5–5.1)
Sodium: 135 mmol/L (ref 135–145)

## 2016-04-28 LAB — IRON AND TIBC
Iron: 80 ug/dL (ref 28–170)
Saturation Ratios: 29 % (ref 10.4–31.8)
TIBC: 273 ug/dL (ref 250–450)
UIBC: 193 ug/dL

## 2016-04-28 LAB — FERRITIN: Ferritin: 282 ng/mL (ref 11–307)

## 2016-04-28 MED ORDER — DARBEPOETIN ALFA 200 MCG/0.4ML IJ SOSY
200.0000 ug | PREFILLED_SYRINGE | INTRAMUSCULAR | Status: DC
  Administered 2016-04-28: 200 ug via SUBCUTANEOUS

## 2016-04-28 MED ORDER — DARBEPOETIN ALFA 200 MCG/0.4ML IJ SOSY
PREFILLED_SYRINGE | INTRAMUSCULAR | Status: AC
  Filled 2016-04-28: qty 0.4

## 2016-04-29 DIAGNOSIS — E872 Acidosis: Secondary | ICD-10-CM | POA: Diagnosis not present

## 2016-04-29 DIAGNOSIS — N185 Chronic kidney disease, stage 5: Secondary | ICD-10-CM | POA: Diagnosis not present

## 2016-04-29 DIAGNOSIS — D631 Anemia in chronic kidney disease: Secondary | ICD-10-CM | POA: Diagnosis not present

## 2016-04-29 DIAGNOSIS — I10 Essential (primary) hypertension: Secondary | ICD-10-CM | POA: Diagnosis not present

## 2016-04-29 DIAGNOSIS — N2581 Secondary hyperparathyroidism of renal origin: Secondary | ICD-10-CM | POA: Diagnosis not present

## 2016-04-29 LAB — POCT HEMOGLOBIN-HEMACUE: Hemoglobin: 8.9 g/dL — ABNORMAL LOW (ref 12.0–15.0)

## 2016-05-05 DIAGNOSIS — N2581 Secondary hyperparathyroidism of renal origin: Secondary | ICD-10-CM | POA: Insufficient documentation

## 2016-05-05 DIAGNOSIS — E1122 Type 2 diabetes mellitus with diabetic chronic kidney disease: Secondary | ICD-10-CM | POA: Insufficient documentation

## 2016-05-05 DIAGNOSIS — E46 Unspecified protein-calorie malnutrition: Secondary | ICD-10-CM

## 2016-05-05 DIAGNOSIS — N186 End stage renal disease: Secondary | ICD-10-CM | POA: Insufficient documentation

## 2016-05-05 DIAGNOSIS — Z111 Encounter for screening for respiratory tuberculosis: Secondary | ICD-10-CM | POA: Insufficient documentation

## 2016-05-05 DIAGNOSIS — N189 Chronic kidney disease, unspecified: Secondary | ICD-10-CM | POA: Insufficient documentation

## 2016-05-05 DIAGNOSIS — D631 Anemia in chronic kidney disease: Secondary | ICD-10-CM | POA: Insufficient documentation

## 2016-05-05 HISTORY — DX: End stage renal disease: N18.6

## 2016-05-05 HISTORY — DX: Type 2 diabetes mellitus with diabetic chronic kidney disease: E11.22

## 2016-05-07 DIAGNOSIS — E1122 Type 2 diabetes mellitus with diabetic chronic kidney disease: Secondary | ICD-10-CM | POA: Diagnosis not present

## 2016-05-07 DIAGNOSIS — D631 Anemia in chronic kidney disease: Secondary | ICD-10-CM | POA: Diagnosis not present

## 2016-05-07 DIAGNOSIS — N186 End stage renal disease: Secondary | ICD-10-CM | POA: Diagnosis not present

## 2016-05-07 DIAGNOSIS — D509 Iron deficiency anemia, unspecified: Secondary | ICD-10-CM | POA: Diagnosis not present

## 2016-05-07 DIAGNOSIS — Z23 Encounter for immunization: Secondary | ICD-10-CM | POA: Diagnosis not present

## 2016-05-10 DIAGNOSIS — D509 Iron deficiency anemia, unspecified: Secondary | ICD-10-CM | POA: Diagnosis not present

## 2016-05-10 DIAGNOSIS — N186 End stage renal disease: Secondary | ICD-10-CM | POA: Diagnosis not present

## 2016-05-10 DIAGNOSIS — Z23 Encounter for immunization: Secondary | ICD-10-CM | POA: Diagnosis not present

## 2016-05-10 DIAGNOSIS — H548 Legal blindness, as defined in USA: Secondary | ICD-10-CM | POA: Diagnosis not present

## 2016-05-10 DIAGNOSIS — D631 Anemia in chronic kidney disease: Secondary | ICD-10-CM | POA: Diagnosis not present

## 2016-05-10 DIAGNOSIS — E1122 Type 2 diabetes mellitus with diabetic chronic kidney disease: Secondary | ICD-10-CM | POA: Diagnosis not present

## 2016-05-10 DIAGNOSIS — H1820 Unspecified corneal edema: Secondary | ICD-10-CM | POA: Diagnosis not present

## 2016-05-12 DIAGNOSIS — D509 Iron deficiency anemia, unspecified: Secondary | ICD-10-CM | POA: Diagnosis not present

## 2016-05-12 DIAGNOSIS — E1122 Type 2 diabetes mellitus with diabetic chronic kidney disease: Secondary | ICD-10-CM | POA: Diagnosis not present

## 2016-05-12 DIAGNOSIS — N186 End stage renal disease: Secondary | ICD-10-CM | POA: Diagnosis not present

## 2016-05-12 DIAGNOSIS — Z23 Encounter for immunization: Secondary | ICD-10-CM | POA: Diagnosis not present

## 2016-05-12 DIAGNOSIS — D631 Anemia in chronic kidney disease: Secondary | ICD-10-CM | POA: Diagnosis not present

## 2016-05-14 DIAGNOSIS — Z23 Encounter for immunization: Secondary | ICD-10-CM | POA: Diagnosis not present

## 2016-05-14 DIAGNOSIS — E1122 Type 2 diabetes mellitus with diabetic chronic kidney disease: Secondary | ICD-10-CM | POA: Diagnosis not present

## 2016-05-14 DIAGNOSIS — D631 Anemia in chronic kidney disease: Secondary | ICD-10-CM | POA: Diagnosis not present

## 2016-05-14 DIAGNOSIS — N186 End stage renal disease: Secondary | ICD-10-CM | POA: Diagnosis not present

## 2016-05-14 DIAGNOSIS — D509 Iron deficiency anemia, unspecified: Secondary | ICD-10-CM | POA: Diagnosis not present

## 2016-05-17 DIAGNOSIS — Z23 Encounter for immunization: Secondary | ICD-10-CM | POA: Diagnosis not present

## 2016-05-17 DIAGNOSIS — N186 End stage renal disease: Secondary | ICD-10-CM | POA: Diagnosis not present

## 2016-05-17 DIAGNOSIS — D509 Iron deficiency anemia, unspecified: Secondary | ICD-10-CM | POA: Diagnosis not present

## 2016-05-17 DIAGNOSIS — D631 Anemia in chronic kidney disease: Secondary | ICD-10-CM | POA: Diagnosis not present

## 2016-05-17 DIAGNOSIS — E1122 Type 2 diabetes mellitus with diabetic chronic kidney disease: Secondary | ICD-10-CM | POA: Diagnosis not present

## 2016-05-19 DIAGNOSIS — N186 End stage renal disease: Secondary | ICD-10-CM | POA: Diagnosis not present

## 2016-05-19 DIAGNOSIS — D631 Anemia in chronic kidney disease: Secondary | ICD-10-CM | POA: Diagnosis not present

## 2016-05-19 DIAGNOSIS — Z23 Encounter for immunization: Secondary | ICD-10-CM | POA: Diagnosis not present

## 2016-05-19 DIAGNOSIS — E1122 Type 2 diabetes mellitus with diabetic chronic kidney disease: Secondary | ICD-10-CM | POA: Diagnosis not present

## 2016-05-19 DIAGNOSIS — D509 Iron deficiency anemia, unspecified: Secondary | ICD-10-CM | POA: Diagnosis not present

## 2016-05-21 DIAGNOSIS — D509 Iron deficiency anemia, unspecified: Secondary | ICD-10-CM | POA: Diagnosis not present

## 2016-05-21 DIAGNOSIS — D631 Anemia in chronic kidney disease: Secondary | ICD-10-CM | POA: Diagnosis not present

## 2016-05-21 DIAGNOSIS — N186 End stage renal disease: Secondary | ICD-10-CM | POA: Diagnosis not present

## 2016-05-21 DIAGNOSIS — Z23 Encounter for immunization: Secondary | ICD-10-CM | POA: Diagnosis not present

## 2016-05-21 DIAGNOSIS — E1122 Type 2 diabetes mellitus with diabetic chronic kidney disease: Secondary | ICD-10-CM | POA: Diagnosis not present

## 2016-05-24 DIAGNOSIS — D631 Anemia in chronic kidney disease: Secondary | ICD-10-CM | POA: Diagnosis not present

## 2016-05-24 DIAGNOSIS — Z23 Encounter for immunization: Secondary | ICD-10-CM | POA: Diagnosis not present

## 2016-05-24 DIAGNOSIS — E1122 Type 2 diabetes mellitus with diabetic chronic kidney disease: Secondary | ICD-10-CM | POA: Diagnosis not present

## 2016-05-24 DIAGNOSIS — N186 End stage renal disease: Secondary | ICD-10-CM | POA: Diagnosis not present

## 2016-05-24 DIAGNOSIS — D509 Iron deficiency anemia, unspecified: Secondary | ICD-10-CM | POA: Diagnosis not present

## 2016-05-26 ENCOUNTER — Encounter (HOSPITAL_COMMUNITY): Payer: Medicare Other

## 2016-05-26 DIAGNOSIS — D631 Anemia in chronic kidney disease: Secondary | ICD-10-CM | POA: Diagnosis not present

## 2016-05-26 DIAGNOSIS — N186 End stage renal disease: Secondary | ICD-10-CM | POA: Diagnosis not present

## 2016-05-26 DIAGNOSIS — E1122 Type 2 diabetes mellitus with diabetic chronic kidney disease: Secondary | ICD-10-CM | POA: Diagnosis not present

## 2016-05-26 DIAGNOSIS — D509 Iron deficiency anemia, unspecified: Secondary | ICD-10-CM | POA: Diagnosis not present

## 2016-05-26 DIAGNOSIS — Z23 Encounter for immunization: Secondary | ICD-10-CM | POA: Diagnosis not present

## 2016-05-28 DIAGNOSIS — D631 Anemia in chronic kidney disease: Secondary | ICD-10-CM | POA: Diagnosis not present

## 2016-05-28 DIAGNOSIS — E1122 Type 2 diabetes mellitus with diabetic chronic kidney disease: Secondary | ICD-10-CM | POA: Diagnosis not present

## 2016-05-28 DIAGNOSIS — D509 Iron deficiency anemia, unspecified: Secondary | ICD-10-CM | POA: Diagnosis not present

## 2016-05-28 DIAGNOSIS — Z23 Encounter for immunization: Secondary | ICD-10-CM | POA: Diagnosis not present

## 2016-05-28 DIAGNOSIS — N186 End stage renal disease: Secondary | ICD-10-CM | POA: Diagnosis not present

## 2016-05-31 DIAGNOSIS — D509 Iron deficiency anemia, unspecified: Secondary | ICD-10-CM | POA: Diagnosis not present

## 2016-05-31 DIAGNOSIS — E1122 Type 2 diabetes mellitus with diabetic chronic kidney disease: Secondary | ICD-10-CM | POA: Diagnosis not present

## 2016-05-31 DIAGNOSIS — D631 Anemia in chronic kidney disease: Secondary | ICD-10-CM | POA: Diagnosis not present

## 2016-05-31 DIAGNOSIS — N186 End stage renal disease: Secondary | ICD-10-CM | POA: Diagnosis not present

## 2016-05-31 DIAGNOSIS — Z23 Encounter for immunization: Secondary | ICD-10-CM | POA: Diagnosis not present

## 2016-06-02 DIAGNOSIS — E1122 Type 2 diabetes mellitus with diabetic chronic kidney disease: Secondary | ICD-10-CM | POA: Diagnosis not present

## 2016-06-02 DIAGNOSIS — Z23 Encounter for immunization: Secondary | ICD-10-CM | POA: Diagnosis not present

## 2016-06-02 DIAGNOSIS — N186 End stage renal disease: Secondary | ICD-10-CM | POA: Diagnosis not present

## 2016-06-02 DIAGNOSIS — D631 Anemia in chronic kidney disease: Secondary | ICD-10-CM | POA: Diagnosis not present

## 2016-06-02 DIAGNOSIS — D509 Iron deficiency anemia, unspecified: Secondary | ICD-10-CM | POA: Diagnosis not present

## 2016-06-04 DIAGNOSIS — D509 Iron deficiency anemia, unspecified: Secondary | ICD-10-CM | POA: Diagnosis not present

## 2016-06-04 DIAGNOSIS — D631 Anemia in chronic kidney disease: Secondary | ICD-10-CM | POA: Diagnosis not present

## 2016-06-04 DIAGNOSIS — N186 End stage renal disease: Secondary | ICD-10-CM | POA: Diagnosis not present

## 2016-06-04 DIAGNOSIS — E1122 Type 2 diabetes mellitus with diabetic chronic kidney disease: Secondary | ICD-10-CM | POA: Diagnosis not present

## 2016-06-04 DIAGNOSIS — Z992 Dependence on renal dialysis: Secondary | ICD-10-CM | POA: Diagnosis not present

## 2016-06-04 DIAGNOSIS — Z23 Encounter for immunization: Secondary | ICD-10-CM | POA: Diagnosis not present

## 2016-06-07 DIAGNOSIS — Z23 Encounter for immunization: Secondary | ICD-10-CM | POA: Diagnosis not present

## 2016-06-07 DIAGNOSIS — D631 Anemia in chronic kidney disease: Secondary | ICD-10-CM | POA: Diagnosis not present

## 2016-06-07 DIAGNOSIS — D509 Iron deficiency anemia, unspecified: Secondary | ICD-10-CM | POA: Diagnosis not present

## 2016-06-07 DIAGNOSIS — N186 End stage renal disease: Secondary | ICD-10-CM | POA: Diagnosis not present

## 2016-06-07 DIAGNOSIS — E1122 Type 2 diabetes mellitus with diabetic chronic kidney disease: Secondary | ICD-10-CM | POA: Diagnosis not present

## 2016-06-09 DIAGNOSIS — D509 Iron deficiency anemia, unspecified: Secondary | ICD-10-CM | POA: Diagnosis not present

## 2016-06-09 DIAGNOSIS — N186 End stage renal disease: Secondary | ICD-10-CM | POA: Diagnosis not present

## 2016-06-09 DIAGNOSIS — Z23 Encounter for immunization: Secondary | ICD-10-CM | POA: Diagnosis not present

## 2016-06-09 DIAGNOSIS — E1122 Type 2 diabetes mellitus with diabetic chronic kidney disease: Secondary | ICD-10-CM | POA: Diagnosis not present

## 2016-06-09 DIAGNOSIS — D631 Anemia in chronic kidney disease: Secondary | ICD-10-CM | POA: Diagnosis not present

## 2016-06-11 DIAGNOSIS — Z23 Encounter for immunization: Secondary | ICD-10-CM | POA: Diagnosis not present

## 2016-06-11 DIAGNOSIS — N186 End stage renal disease: Secondary | ICD-10-CM | POA: Diagnosis not present

## 2016-06-11 DIAGNOSIS — D631 Anemia in chronic kidney disease: Secondary | ICD-10-CM | POA: Diagnosis not present

## 2016-06-11 DIAGNOSIS — E1122 Type 2 diabetes mellitus with diabetic chronic kidney disease: Secondary | ICD-10-CM | POA: Diagnosis not present

## 2016-06-11 DIAGNOSIS — D509 Iron deficiency anemia, unspecified: Secondary | ICD-10-CM | POA: Diagnosis not present

## 2016-06-14 DIAGNOSIS — Z23 Encounter for immunization: Secondary | ICD-10-CM | POA: Diagnosis not present

## 2016-06-14 DIAGNOSIS — N186 End stage renal disease: Secondary | ICD-10-CM | POA: Diagnosis not present

## 2016-06-14 DIAGNOSIS — D509 Iron deficiency anemia, unspecified: Secondary | ICD-10-CM | POA: Diagnosis not present

## 2016-06-14 DIAGNOSIS — E1122 Type 2 diabetes mellitus with diabetic chronic kidney disease: Secondary | ICD-10-CM | POA: Diagnosis not present

## 2016-06-14 DIAGNOSIS — D631 Anemia in chronic kidney disease: Secondary | ICD-10-CM | POA: Diagnosis not present

## 2016-06-16 DIAGNOSIS — N186 End stage renal disease: Secondary | ICD-10-CM | POA: Diagnosis not present

## 2016-06-16 DIAGNOSIS — D631 Anemia in chronic kidney disease: Secondary | ICD-10-CM | POA: Diagnosis not present

## 2016-06-16 DIAGNOSIS — Z23 Encounter for immunization: Secondary | ICD-10-CM | POA: Diagnosis not present

## 2016-06-16 DIAGNOSIS — D509 Iron deficiency anemia, unspecified: Secondary | ICD-10-CM | POA: Diagnosis not present

## 2016-06-16 DIAGNOSIS — E1122 Type 2 diabetes mellitus with diabetic chronic kidney disease: Secondary | ICD-10-CM | POA: Diagnosis not present

## 2016-06-17 DIAGNOSIS — H1812 Bullous keratopathy, left eye: Secondary | ICD-10-CM | POA: Diagnosis not present

## 2016-06-17 DIAGNOSIS — H548 Legal blindness, as defined in USA: Secondary | ICD-10-CM | POA: Diagnosis not present

## 2016-06-18 DIAGNOSIS — N186 End stage renal disease: Secondary | ICD-10-CM | POA: Diagnosis not present

## 2016-06-18 DIAGNOSIS — D509 Iron deficiency anemia, unspecified: Secondary | ICD-10-CM | POA: Diagnosis not present

## 2016-06-18 DIAGNOSIS — D631 Anemia in chronic kidney disease: Secondary | ICD-10-CM | POA: Diagnosis not present

## 2016-06-18 DIAGNOSIS — E1122 Type 2 diabetes mellitus with diabetic chronic kidney disease: Secondary | ICD-10-CM | POA: Diagnosis not present

## 2016-06-18 DIAGNOSIS — Z23 Encounter for immunization: Secondary | ICD-10-CM | POA: Diagnosis not present

## 2016-06-21 DIAGNOSIS — N186 End stage renal disease: Secondary | ICD-10-CM | POA: Diagnosis not present

## 2016-06-21 DIAGNOSIS — E1122 Type 2 diabetes mellitus with diabetic chronic kidney disease: Secondary | ICD-10-CM | POA: Diagnosis not present

## 2016-06-21 DIAGNOSIS — Z23 Encounter for immunization: Secondary | ICD-10-CM | POA: Diagnosis not present

## 2016-06-21 DIAGNOSIS — D631 Anemia in chronic kidney disease: Secondary | ICD-10-CM | POA: Diagnosis not present

## 2016-06-21 DIAGNOSIS — D509 Iron deficiency anemia, unspecified: Secondary | ICD-10-CM | POA: Diagnosis not present

## 2016-06-22 DIAGNOSIS — E1122 Type 2 diabetes mellitus with diabetic chronic kidney disease: Secondary | ICD-10-CM | POA: Diagnosis not present

## 2016-06-22 DIAGNOSIS — I10 Essential (primary) hypertension: Secondary | ICD-10-CM | POA: Diagnosis not present

## 2016-06-23 DIAGNOSIS — D631 Anemia in chronic kidney disease: Secondary | ICD-10-CM | POA: Diagnosis not present

## 2016-06-23 DIAGNOSIS — N186 End stage renal disease: Secondary | ICD-10-CM | POA: Diagnosis not present

## 2016-06-23 DIAGNOSIS — Z23 Encounter for immunization: Secondary | ICD-10-CM | POA: Diagnosis not present

## 2016-06-23 DIAGNOSIS — D509 Iron deficiency anemia, unspecified: Secondary | ICD-10-CM | POA: Diagnosis not present

## 2016-06-23 DIAGNOSIS — E1122 Type 2 diabetes mellitus with diabetic chronic kidney disease: Secondary | ICD-10-CM | POA: Diagnosis not present

## 2016-06-25 DIAGNOSIS — E1122 Type 2 diabetes mellitus with diabetic chronic kidney disease: Secondary | ICD-10-CM | POA: Diagnosis not present

## 2016-06-25 DIAGNOSIS — D631 Anemia in chronic kidney disease: Secondary | ICD-10-CM | POA: Diagnosis not present

## 2016-06-25 DIAGNOSIS — Z23 Encounter for immunization: Secondary | ICD-10-CM | POA: Diagnosis not present

## 2016-06-25 DIAGNOSIS — D509 Iron deficiency anemia, unspecified: Secondary | ICD-10-CM | POA: Diagnosis not present

## 2016-06-25 DIAGNOSIS — N186 End stage renal disease: Secondary | ICD-10-CM | POA: Diagnosis not present

## 2016-06-28 DIAGNOSIS — E1122 Type 2 diabetes mellitus with diabetic chronic kidney disease: Secondary | ICD-10-CM | POA: Diagnosis not present

## 2016-06-28 DIAGNOSIS — Z23 Encounter for immunization: Secondary | ICD-10-CM | POA: Diagnosis not present

## 2016-06-28 DIAGNOSIS — N186 End stage renal disease: Secondary | ICD-10-CM | POA: Diagnosis not present

## 2016-06-28 DIAGNOSIS — D509 Iron deficiency anemia, unspecified: Secondary | ICD-10-CM | POA: Diagnosis not present

## 2016-06-28 DIAGNOSIS — D631 Anemia in chronic kidney disease: Secondary | ICD-10-CM | POA: Diagnosis not present

## 2016-06-30 DIAGNOSIS — E1122 Type 2 diabetes mellitus with diabetic chronic kidney disease: Secondary | ICD-10-CM | POA: Diagnosis not present

## 2016-06-30 DIAGNOSIS — N186 End stage renal disease: Secondary | ICD-10-CM | POA: Diagnosis not present

## 2016-06-30 DIAGNOSIS — D631 Anemia in chronic kidney disease: Secondary | ICD-10-CM | POA: Diagnosis not present

## 2016-06-30 DIAGNOSIS — Z23 Encounter for immunization: Secondary | ICD-10-CM | POA: Diagnosis not present

## 2016-06-30 DIAGNOSIS — D509 Iron deficiency anemia, unspecified: Secondary | ICD-10-CM | POA: Diagnosis not present

## 2016-07-02 DIAGNOSIS — Z23 Encounter for immunization: Secondary | ICD-10-CM | POA: Diagnosis not present

## 2016-07-02 DIAGNOSIS — N186 End stage renal disease: Secondary | ICD-10-CM | POA: Diagnosis not present

## 2016-07-02 DIAGNOSIS — D509 Iron deficiency anemia, unspecified: Secondary | ICD-10-CM | POA: Diagnosis not present

## 2016-07-02 DIAGNOSIS — E1122 Type 2 diabetes mellitus with diabetic chronic kidney disease: Secondary | ICD-10-CM | POA: Diagnosis not present

## 2016-07-02 DIAGNOSIS — D631 Anemia in chronic kidney disease: Secondary | ICD-10-CM | POA: Diagnosis not present

## 2016-07-05 DIAGNOSIS — Z992 Dependence on renal dialysis: Secondary | ICD-10-CM | POA: Diagnosis not present

## 2016-07-05 DIAGNOSIS — D631 Anemia in chronic kidney disease: Secondary | ICD-10-CM | POA: Diagnosis not present

## 2016-07-05 DIAGNOSIS — E1122 Type 2 diabetes mellitus with diabetic chronic kidney disease: Secondary | ICD-10-CM | POA: Diagnosis not present

## 2016-07-05 DIAGNOSIS — D509 Iron deficiency anemia, unspecified: Secondary | ICD-10-CM | POA: Diagnosis not present

## 2016-07-05 DIAGNOSIS — N186 End stage renal disease: Secondary | ICD-10-CM | POA: Diagnosis not present

## 2016-07-05 DIAGNOSIS — Z23 Encounter for immunization: Secondary | ICD-10-CM | POA: Diagnosis not present

## 2016-07-07 DIAGNOSIS — D509 Iron deficiency anemia, unspecified: Secondary | ICD-10-CM | POA: Diagnosis not present

## 2016-07-07 DIAGNOSIS — E1122 Type 2 diabetes mellitus with diabetic chronic kidney disease: Secondary | ICD-10-CM | POA: Diagnosis not present

## 2016-07-07 DIAGNOSIS — Z23 Encounter for immunization: Secondary | ICD-10-CM | POA: Diagnosis not present

## 2016-07-07 DIAGNOSIS — N186 End stage renal disease: Secondary | ICD-10-CM | POA: Diagnosis not present

## 2016-07-07 DIAGNOSIS — D631 Anemia in chronic kidney disease: Secondary | ICD-10-CM | POA: Diagnosis not present

## 2016-07-09 DIAGNOSIS — D631 Anemia in chronic kidney disease: Secondary | ICD-10-CM | POA: Diagnosis not present

## 2016-07-09 DIAGNOSIS — N186 End stage renal disease: Secondary | ICD-10-CM | POA: Diagnosis not present

## 2016-07-09 DIAGNOSIS — E1122 Type 2 diabetes mellitus with diabetic chronic kidney disease: Secondary | ICD-10-CM | POA: Diagnosis not present

## 2016-07-09 DIAGNOSIS — D509 Iron deficiency anemia, unspecified: Secondary | ICD-10-CM | POA: Diagnosis not present

## 2016-07-09 DIAGNOSIS — Z23 Encounter for immunization: Secondary | ICD-10-CM | POA: Diagnosis not present

## 2016-07-12 DIAGNOSIS — Z23 Encounter for immunization: Secondary | ICD-10-CM | POA: Diagnosis not present

## 2016-07-12 DIAGNOSIS — D509 Iron deficiency anemia, unspecified: Secondary | ICD-10-CM | POA: Diagnosis not present

## 2016-07-12 DIAGNOSIS — D631 Anemia in chronic kidney disease: Secondary | ICD-10-CM | POA: Diagnosis not present

## 2016-07-12 DIAGNOSIS — N186 End stage renal disease: Secondary | ICD-10-CM | POA: Diagnosis not present

## 2016-07-12 DIAGNOSIS — E1122 Type 2 diabetes mellitus with diabetic chronic kidney disease: Secondary | ICD-10-CM | POA: Diagnosis not present

## 2016-07-14 DIAGNOSIS — E1122 Type 2 diabetes mellitus with diabetic chronic kidney disease: Secondary | ICD-10-CM | POA: Diagnosis not present

## 2016-07-14 DIAGNOSIS — D509 Iron deficiency anemia, unspecified: Secondary | ICD-10-CM | POA: Diagnosis not present

## 2016-07-14 DIAGNOSIS — Z23 Encounter for immunization: Secondary | ICD-10-CM | POA: Diagnosis not present

## 2016-07-14 DIAGNOSIS — D631 Anemia in chronic kidney disease: Secondary | ICD-10-CM | POA: Diagnosis not present

## 2016-07-14 DIAGNOSIS — N186 End stage renal disease: Secondary | ICD-10-CM | POA: Diagnosis not present

## 2016-07-16 DIAGNOSIS — D509 Iron deficiency anemia, unspecified: Secondary | ICD-10-CM | POA: Diagnosis not present

## 2016-07-16 DIAGNOSIS — Z23 Encounter for immunization: Secondary | ICD-10-CM | POA: Diagnosis not present

## 2016-07-16 DIAGNOSIS — D631 Anemia in chronic kidney disease: Secondary | ICD-10-CM | POA: Diagnosis not present

## 2016-07-16 DIAGNOSIS — N186 End stage renal disease: Secondary | ICD-10-CM | POA: Diagnosis not present

## 2016-07-16 DIAGNOSIS — E1122 Type 2 diabetes mellitus with diabetic chronic kidney disease: Secondary | ICD-10-CM | POA: Diagnosis not present

## 2016-07-19 DIAGNOSIS — E1122 Type 2 diabetes mellitus with diabetic chronic kidney disease: Secondary | ICD-10-CM | POA: Diagnosis not present

## 2016-07-19 DIAGNOSIS — Z23 Encounter for immunization: Secondary | ICD-10-CM | POA: Diagnosis not present

## 2016-07-19 DIAGNOSIS — D631 Anemia in chronic kidney disease: Secondary | ICD-10-CM | POA: Diagnosis not present

## 2016-07-19 DIAGNOSIS — D509 Iron deficiency anemia, unspecified: Secondary | ICD-10-CM | POA: Diagnosis not present

## 2016-07-19 DIAGNOSIS — N186 End stage renal disease: Secondary | ICD-10-CM | POA: Diagnosis not present

## 2016-07-21 DIAGNOSIS — D631 Anemia in chronic kidney disease: Secondary | ICD-10-CM | POA: Diagnosis not present

## 2016-07-21 DIAGNOSIS — E1122 Type 2 diabetes mellitus with diabetic chronic kidney disease: Secondary | ICD-10-CM | POA: Diagnosis not present

## 2016-07-21 DIAGNOSIS — Z23 Encounter for immunization: Secondary | ICD-10-CM | POA: Diagnosis not present

## 2016-07-21 DIAGNOSIS — N186 End stage renal disease: Secondary | ICD-10-CM | POA: Diagnosis not present

## 2016-07-21 DIAGNOSIS — D509 Iron deficiency anemia, unspecified: Secondary | ICD-10-CM | POA: Diagnosis not present

## 2016-07-23 DIAGNOSIS — N186 End stage renal disease: Secondary | ICD-10-CM | POA: Diagnosis not present

## 2016-07-23 DIAGNOSIS — Z23 Encounter for immunization: Secondary | ICD-10-CM | POA: Diagnosis not present

## 2016-07-23 DIAGNOSIS — D631 Anemia in chronic kidney disease: Secondary | ICD-10-CM | POA: Diagnosis not present

## 2016-07-23 DIAGNOSIS — E1122 Type 2 diabetes mellitus with diabetic chronic kidney disease: Secondary | ICD-10-CM | POA: Diagnosis not present

## 2016-07-23 DIAGNOSIS — D509 Iron deficiency anemia, unspecified: Secondary | ICD-10-CM | POA: Diagnosis not present

## 2016-07-26 DIAGNOSIS — D631 Anemia in chronic kidney disease: Secondary | ICD-10-CM | POA: Diagnosis not present

## 2016-07-26 DIAGNOSIS — N186 End stage renal disease: Secondary | ICD-10-CM | POA: Diagnosis not present

## 2016-07-26 DIAGNOSIS — D509 Iron deficiency anemia, unspecified: Secondary | ICD-10-CM | POA: Diagnosis not present

## 2016-07-26 DIAGNOSIS — Z23 Encounter for immunization: Secondary | ICD-10-CM | POA: Diagnosis not present

## 2016-07-26 DIAGNOSIS — E1122 Type 2 diabetes mellitus with diabetic chronic kidney disease: Secondary | ICD-10-CM | POA: Diagnosis not present

## 2016-07-28 DIAGNOSIS — N186 End stage renal disease: Secondary | ICD-10-CM | POA: Diagnosis not present

## 2016-07-28 DIAGNOSIS — D509 Iron deficiency anemia, unspecified: Secondary | ICD-10-CM | POA: Diagnosis not present

## 2016-07-28 DIAGNOSIS — Z23 Encounter for immunization: Secondary | ICD-10-CM | POA: Diagnosis not present

## 2016-07-28 DIAGNOSIS — E1122 Type 2 diabetes mellitus with diabetic chronic kidney disease: Secondary | ICD-10-CM | POA: Diagnosis not present

## 2016-07-28 DIAGNOSIS — D631 Anemia in chronic kidney disease: Secondary | ICD-10-CM | POA: Diagnosis not present

## 2016-07-30 DIAGNOSIS — D509 Iron deficiency anemia, unspecified: Secondary | ICD-10-CM | POA: Diagnosis not present

## 2016-07-30 DIAGNOSIS — Z23 Encounter for immunization: Secondary | ICD-10-CM | POA: Diagnosis not present

## 2016-07-30 DIAGNOSIS — N186 End stage renal disease: Secondary | ICD-10-CM | POA: Diagnosis not present

## 2016-07-30 DIAGNOSIS — E1122 Type 2 diabetes mellitus with diabetic chronic kidney disease: Secondary | ICD-10-CM | POA: Diagnosis not present

## 2016-07-30 DIAGNOSIS — D631 Anemia in chronic kidney disease: Secondary | ICD-10-CM | POA: Diagnosis not present

## 2016-08-02 DIAGNOSIS — D509 Iron deficiency anemia, unspecified: Secondary | ICD-10-CM | POA: Diagnosis not present

## 2016-08-02 DIAGNOSIS — D631 Anemia in chronic kidney disease: Secondary | ICD-10-CM | POA: Diagnosis not present

## 2016-08-02 DIAGNOSIS — Z23 Encounter for immunization: Secondary | ICD-10-CM | POA: Diagnosis not present

## 2016-08-02 DIAGNOSIS — E1122 Type 2 diabetes mellitus with diabetic chronic kidney disease: Secondary | ICD-10-CM | POA: Diagnosis not present

## 2016-08-02 DIAGNOSIS — N186 End stage renal disease: Secondary | ICD-10-CM | POA: Diagnosis not present

## 2016-08-04 DIAGNOSIS — D509 Iron deficiency anemia, unspecified: Secondary | ICD-10-CM | POA: Diagnosis not present

## 2016-08-04 DIAGNOSIS — Z23 Encounter for immunization: Secondary | ICD-10-CM | POA: Diagnosis not present

## 2016-08-04 DIAGNOSIS — D631 Anemia in chronic kidney disease: Secondary | ICD-10-CM | POA: Diagnosis not present

## 2016-08-04 DIAGNOSIS — N186 End stage renal disease: Secondary | ICD-10-CM | POA: Diagnosis not present

## 2016-08-04 DIAGNOSIS — E1122 Type 2 diabetes mellitus with diabetic chronic kidney disease: Secondary | ICD-10-CM | POA: Diagnosis not present

## 2016-08-05 DIAGNOSIS — E1122 Type 2 diabetes mellitus with diabetic chronic kidney disease: Secondary | ICD-10-CM | POA: Diagnosis not present

## 2016-08-05 DIAGNOSIS — Z992 Dependence on renal dialysis: Secondary | ICD-10-CM | POA: Diagnosis not present

## 2016-08-05 DIAGNOSIS — E113593 Type 2 diabetes mellitus with proliferative diabetic retinopathy without macular edema, bilateral: Secondary | ICD-10-CM | POA: Diagnosis not present

## 2016-08-05 DIAGNOSIS — N186 End stage renal disease: Secondary | ICD-10-CM | POA: Diagnosis not present

## 2016-08-06 DIAGNOSIS — E1122 Type 2 diabetes mellitus with diabetic chronic kidney disease: Secondary | ICD-10-CM | POA: Diagnosis not present

## 2016-08-06 DIAGNOSIS — N186 End stage renal disease: Secondary | ICD-10-CM | POA: Diagnosis not present

## 2016-08-06 DIAGNOSIS — Z23 Encounter for immunization: Secondary | ICD-10-CM | POA: Diagnosis not present

## 2016-08-06 DIAGNOSIS — D631 Anemia in chronic kidney disease: Secondary | ICD-10-CM | POA: Diagnosis not present

## 2016-08-09 DIAGNOSIS — E1122 Type 2 diabetes mellitus with diabetic chronic kidney disease: Secondary | ICD-10-CM | POA: Diagnosis not present

## 2016-08-09 DIAGNOSIS — N186 End stage renal disease: Secondary | ICD-10-CM | POA: Diagnosis not present

## 2016-08-09 DIAGNOSIS — Z23 Encounter for immunization: Secondary | ICD-10-CM | POA: Diagnosis not present

## 2016-08-09 DIAGNOSIS — D631 Anemia in chronic kidney disease: Secondary | ICD-10-CM | POA: Diagnosis not present

## 2016-08-11 DIAGNOSIS — E1122 Type 2 diabetes mellitus with diabetic chronic kidney disease: Secondary | ICD-10-CM | POA: Diagnosis not present

## 2016-08-11 DIAGNOSIS — N186 End stage renal disease: Secondary | ICD-10-CM | POA: Diagnosis not present

## 2016-08-11 DIAGNOSIS — D631 Anemia in chronic kidney disease: Secondary | ICD-10-CM | POA: Diagnosis not present

## 2016-08-11 DIAGNOSIS — Z23 Encounter for immunization: Secondary | ICD-10-CM | POA: Diagnosis not present

## 2016-08-13 DIAGNOSIS — D631 Anemia in chronic kidney disease: Secondary | ICD-10-CM | POA: Diagnosis not present

## 2016-08-13 DIAGNOSIS — N186 End stage renal disease: Secondary | ICD-10-CM | POA: Diagnosis not present

## 2016-08-13 DIAGNOSIS — Z23 Encounter for immunization: Secondary | ICD-10-CM | POA: Diagnosis not present

## 2016-08-13 DIAGNOSIS — E1122 Type 2 diabetes mellitus with diabetic chronic kidney disease: Secondary | ICD-10-CM | POA: Diagnosis not present

## 2016-08-16 DIAGNOSIS — N186 End stage renal disease: Secondary | ICD-10-CM | POA: Diagnosis not present

## 2016-08-16 DIAGNOSIS — D631 Anemia in chronic kidney disease: Secondary | ICD-10-CM | POA: Diagnosis not present

## 2016-08-16 DIAGNOSIS — E1122 Type 2 diabetes mellitus with diabetic chronic kidney disease: Secondary | ICD-10-CM | POA: Diagnosis not present

## 2016-08-16 DIAGNOSIS — Z23 Encounter for immunization: Secondary | ICD-10-CM | POA: Diagnosis not present

## 2016-08-18 DIAGNOSIS — E1122 Type 2 diabetes mellitus with diabetic chronic kidney disease: Secondary | ICD-10-CM | POA: Diagnosis not present

## 2016-08-18 DIAGNOSIS — N186 End stage renal disease: Secondary | ICD-10-CM | POA: Diagnosis not present

## 2016-08-18 DIAGNOSIS — D631 Anemia in chronic kidney disease: Secondary | ICD-10-CM | POA: Diagnosis not present

## 2016-08-18 DIAGNOSIS — Z23 Encounter for immunization: Secondary | ICD-10-CM | POA: Diagnosis not present

## 2016-08-20 DIAGNOSIS — E1122 Type 2 diabetes mellitus with diabetic chronic kidney disease: Secondary | ICD-10-CM | POA: Diagnosis not present

## 2016-08-20 DIAGNOSIS — Z23 Encounter for immunization: Secondary | ICD-10-CM | POA: Diagnosis not present

## 2016-08-20 DIAGNOSIS — N186 End stage renal disease: Secondary | ICD-10-CM | POA: Diagnosis not present

## 2016-08-20 DIAGNOSIS — D631 Anemia in chronic kidney disease: Secondary | ICD-10-CM | POA: Diagnosis not present

## 2016-08-23 DIAGNOSIS — N186 End stage renal disease: Secondary | ICD-10-CM | POA: Diagnosis not present

## 2016-08-23 DIAGNOSIS — Z23 Encounter for immunization: Secondary | ICD-10-CM | POA: Diagnosis not present

## 2016-08-23 DIAGNOSIS — D631 Anemia in chronic kidney disease: Secondary | ICD-10-CM | POA: Diagnosis not present

## 2016-08-23 DIAGNOSIS — E1122 Type 2 diabetes mellitus with diabetic chronic kidney disease: Secondary | ICD-10-CM | POA: Diagnosis not present

## 2016-08-25 DIAGNOSIS — D631 Anemia in chronic kidney disease: Secondary | ICD-10-CM | POA: Diagnosis not present

## 2016-08-25 DIAGNOSIS — N186 End stage renal disease: Secondary | ICD-10-CM | POA: Diagnosis not present

## 2016-08-25 DIAGNOSIS — Z23 Encounter for immunization: Secondary | ICD-10-CM | POA: Diagnosis not present

## 2016-08-25 DIAGNOSIS — E1122 Type 2 diabetes mellitus with diabetic chronic kidney disease: Secondary | ICD-10-CM | POA: Diagnosis not present

## 2016-08-27 DIAGNOSIS — Z23 Encounter for immunization: Secondary | ICD-10-CM | POA: Diagnosis not present

## 2016-08-27 DIAGNOSIS — N186 End stage renal disease: Secondary | ICD-10-CM | POA: Diagnosis not present

## 2016-08-27 DIAGNOSIS — D631 Anemia in chronic kidney disease: Secondary | ICD-10-CM | POA: Diagnosis not present

## 2016-08-27 DIAGNOSIS — E1122 Type 2 diabetes mellitus with diabetic chronic kidney disease: Secondary | ICD-10-CM | POA: Diagnosis not present

## 2016-08-30 DIAGNOSIS — E1122 Type 2 diabetes mellitus with diabetic chronic kidney disease: Secondary | ICD-10-CM | POA: Diagnosis not present

## 2016-08-30 DIAGNOSIS — D631 Anemia in chronic kidney disease: Secondary | ICD-10-CM | POA: Diagnosis not present

## 2016-08-30 DIAGNOSIS — N186 End stage renal disease: Secondary | ICD-10-CM | POA: Diagnosis not present

## 2016-08-30 DIAGNOSIS — Z23 Encounter for immunization: Secondary | ICD-10-CM | POA: Diagnosis not present

## 2016-09-01 DIAGNOSIS — E1122 Type 2 diabetes mellitus with diabetic chronic kidney disease: Secondary | ICD-10-CM | POA: Diagnosis not present

## 2016-09-01 DIAGNOSIS — D631 Anemia in chronic kidney disease: Secondary | ICD-10-CM | POA: Diagnosis not present

## 2016-09-01 DIAGNOSIS — N186 End stage renal disease: Secondary | ICD-10-CM | POA: Diagnosis not present

## 2016-09-01 DIAGNOSIS — Z23 Encounter for immunization: Secondary | ICD-10-CM | POA: Diagnosis not present

## 2016-09-03 DIAGNOSIS — D631 Anemia in chronic kidney disease: Secondary | ICD-10-CM | POA: Diagnosis not present

## 2016-09-03 DIAGNOSIS — N186 End stage renal disease: Secondary | ICD-10-CM | POA: Diagnosis not present

## 2016-09-03 DIAGNOSIS — E1122 Type 2 diabetes mellitus with diabetic chronic kidney disease: Secondary | ICD-10-CM | POA: Diagnosis not present

## 2016-09-03 DIAGNOSIS — Z23 Encounter for immunization: Secondary | ICD-10-CM | POA: Diagnosis not present

## 2016-09-04 DIAGNOSIS — N186 End stage renal disease: Secondary | ICD-10-CM | POA: Diagnosis not present

## 2016-09-04 DIAGNOSIS — E1122 Type 2 diabetes mellitus with diabetic chronic kidney disease: Secondary | ICD-10-CM | POA: Diagnosis not present

## 2016-09-04 DIAGNOSIS — Z992 Dependence on renal dialysis: Secondary | ICD-10-CM | POA: Diagnosis not present

## 2016-09-06 DIAGNOSIS — D631 Anemia in chronic kidney disease: Secondary | ICD-10-CM | POA: Diagnosis not present

## 2016-09-06 DIAGNOSIS — Z23 Encounter for immunization: Secondary | ICD-10-CM | POA: Diagnosis not present

## 2016-09-06 DIAGNOSIS — N186 End stage renal disease: Secondary | ICD-10-CM | POA: Diagnosis not present

## 2016-09-08 DIAGNOSIS — N186 End stage renal disease: Secondary | ICD-10-CM | POA: Diagnosis not present

## 2016-09-08 DIAGNOSIS — D631 Anemia in chronic kidney disease: Secondary | ICD-10-CM | POA: Diagnosis not present

## 2016-09-08 DIAGNOSIS — Z23 Encounter for immunization: Secondary | ICD-10-CM | POA: Diagnosis not present

## 2016-09-10 DIAGNOSIS — N186 End stage renal disease: Secondary | ICD-10-CM | POA: Diagnosis not present

## 2016-09-10 DIAGNOSIS — D631 Anemia in chronic kidney disease: Secondary | ICD-10-CM | POA: Diagnosis not present

## 2016-09-10 DIAGNOSIS — Z23 Encounter for immunization: Secondary | ICD-10-CM | POA: Diagnosis not present

## 2016-09-13 DIAGNOSIS — N186 End stage renal disease: Secondary | ICD-10-CM | POA: Diagnosis not present

## 2016-09-13 DIAGNOSIS — D631 Anemia in chronic kidney disease: Secondary | ICD-10-CM | POA: Diagnosis not present

## 2016-09-13 DIAGNOSIS — Z23 Encounter for immunization: Secondary | ICD-10-CM | POA: Diagnosis not present

## 2016-09-15 DIAGNOSIS — D631 Anemia in chronic kidney disease: Secondary | ICD-10-CM | POA: Diagnosis not present

## 2016-09-15 DIAGNOSIS — Z23 Encounter for immunization: Secondary | ICD-10-CM | POA: Diagnosis not present

## 2016-09-15 DIAGNOSIS — N186 End stage renal disease: Secondary | ICD-10-CM | POA: Diagnosis not present

## 2016-09-17 DIAGNOSIS — N186 End stage renal disease: Secondary | ICD-10-CM | POA: Diagnosis not present

## 2016-09-17 DIAGNOSIS — Z23 Encounter for immunization: Secondary | ICD-10-CM | POA: Diagnosis not present

## 2016-09-17 DIAGNOSIS — D631 Anemia in chronic kidney disease: Secondary | ICD-10-CM | POA: Diagnosis not present

## 2016-09-20 DIAGNOSIS — Z23 Encounter for immunization: Secondary | ICD-10-CM | POA: Diagnosis not present

## 2016-09-20 DIAGNOSIS — D631 Anemia in chronic kidney disease: Secondary | ICD-10-CM | POA: Diagnosis not present

## 2016-09-20 DIAGNOSIS — N186 End stage renal disease: Secondary | ICD-10-CM | POA: Diagnosis not present

## 2016-09-22 DIAGNOSIS — N186 End stage renal disease: Secondary | ICD-10-CM | POA: Diagnosis not present

## 2016-09-22 DIAGNOSIS — Z23 Encounter for immunization: Secondary | ICD-10-CM | POA: Diagnosis not present

## 2016-09-22 DIAGNOSIS — D631 Anemia in chronic kidney disease: Secondary | ICD-10-CM | POA: Diagnosis not present

## 2016-09-24 DIAGNOSIS — Z23 Encounter for immunization: Secondary | ICD-10-CM | POA: Diagnosis not present

## 2016-09-24 DIAGNOSIS — N186 End stage renal disease: Secondary | ICD-10-CM | POA: Diagnosis not present

## 2016-09-24 DIAGNOSIS — D631 Anemia in chronic kidney disease: Secondary | ICD-10-CM | POA: Diagnosis not present

## 2016-09-27 DIAGNOSIS — Z23 Encounter for immunization: Secondary | ICD-10-CM | POA: Diagnosis not present

## 2016-09-27 DIAGNOSIS — N186 End stage renal disease: Secondary | ICD-10-CM | POA: Diagnosis not present

## 2016-09-27 DIAGNOSIS — D631 Anemia in chronic kidney disease: Secondary | ICD-10-CM | POA: Diagnosis not present

## 2016-09-28 DIAGNOSIS — E1165 Type 2 diabetes mellitus with hyperglycemia: Secondary | ICD-10-CM | POA: Diagnosis not present

## 2016-09-28 DIAGNOSIS — I10 Essential (primary) hypertension: Secondary | ICD-10-CM | POA: Diagnosis not present

## 2016-09-28 DIAGNOSIS — Z993 Dependence on wheelchair: Secondary | ICD-10-CM | POA: Diagnosis not present

## 2016-09-28 DIAGNOSIS — Z89511 Acquired absence of right leg below knee: Secondary | ICD-10-CM | POA: Diagnosis not present

## 2016-09-29 DIAGNOSIS — Z23 Encounter for immunization: Secondary | ICD-10-CM | POA: Diagnosis not present

## 2016-09-29 DIAGNOSIS — N186 End stage renal disease: Secondary | ICD-10-CM | POA: Diagnosis not present

## 2016-09-29 DIAGNOSIS — E1122 Type 2 diabetes mellitus with diabetic chronic kidney disease: Secondary | ICD-10-CM | POA: Diagnosis not present

## 2016-09-29 DIAGNOSIS — D631 Anemia in chronic kidney disease: Secondary | ICD-10-CM | POA: Diagnosis not present

## 2016-10-01 DIAGNOSIS — D631 Anemia in chronic kidney disease: Secondary | ICD-10-CM | POA: Diagnosis not present

## 2016-10-01 DIAGNOSIS — N186 End stage renal disease: Secondary | ICD-10-CM | POA: Diagnosis not present

## 2016-10-01 DIAGNOSIS — Z23 Encounter for immunization: Secondary | ICD-10-CM | POA: Diagnosis not present

## 2016-10-04 DIAGNOSIS — Z23 Encounter for immunization: Secondary | ICD-10-CM | POA: Diagnosis not present

## 2016-10-04 DIAGNOSIS — D631 Anemia in chronic kidney disease: Secondary | ICD-10-CM | POA: Diagnosis not present

## 2016-10-04 DIAGNOSIS — N186 End stage renal disease: Secondary | ICD-10-CM | POA: Diagnosis not present

## 2016-10-05 DIAGNOSIS — Z992 Dependence on renal dialysis: Secondary | ICD-10-CM | POA: Diagnosis not present

## 2016-10-05 DIAGNOSIS — E1122 Type 2 diabetes mellitus with diabetic chronic kidney disease: Secondary | ICD-10-CM | POA: Diagnosis not present

## 2016-10-05 DIAGNOSIS — N186 End stage renal disease: Secondary | ICD-10-CM | POA: Diagnosis not present

## 2016-10-06 DIAGNOSIS — D509 Iron deficiency anemia, unspecified: Secondary | ICD-10-CM | POA: Diagnosis not present

## 2016-10-06 DIAGNOSIS — E1122 Type 2 diabetes mellitus with diabetic chronic kidney disease: Secondary | ICD-10-CM | POA: Diagnosis not present

## 2016-10-06 DIAGNOSIS — N186 End stage renal disease: Secondary | ICD-10-CM | POA: Diagnosis not present

## 2016-10-06 DIAGNOSIS — D631 Anemia in chronic kidney disease: Secondary | ICD-10-CM | POA: Diagnosis not present

## 2016-10-07 DIAGNOSIS — H1820 Unspecified corneal edema: Secondary | ICD-10-CM | POA: Diagnosis not present

## 2016-10-08 DIAGNOSIS — E1122 Type 2 diabetes mellitus with diabetic chronic kidney disease: Secondary | ICD-10-CM | POA: Diagnosis not present

## 2016-10-08 DIAGNOSIS — D631 Anemia in chronic kidney disease: Secondary | ICD-10-CM | POA: Diagnosis not present

## 2016-10-08 DIAGNOSIS — D509 Iron deficiency anemia, unspecified: Secondary | ICD-10-CM | POA: Diagnosis not present

## 2016-10-08 DIAGNOSIS — N186 End stage renal disease: Secondary | ICD-10-CM | POA: Diagnosis not present

## 2016-10-11 DIAGNOSIS — D631 Anemia in chronic kidney disease: Secondary | ICD-10-CM | POA: Diagnosis not present

## 2016-10-11 DIAGNOSIS — D509 Iron deficiency anemia, unspecified: Secondary | ICD-10-CM | POA: Diagnosis not present

## 2016-10-11 DIAGNOSIS — E1122 Type 2 diabetes mellitus with diabetic chronic kidney disease: Secondary | ICD-10-CM | POA: Diagnosis not present

## 2016-10-11 DIAGNOSIS — N186 End stage renal disease: Secondary | ICD-10-CM | POA: Diagnosis not present

## 2016-10-13 DIAGNOSIS — E1122 Type 2 diabetes mellitus with diabetic chronic kidney disease: Secondary | ICD-10-CM | POA: Diagnosis not present

## 2016-10-13 DIAGNOSIS — D631 Anemia in chronic kidney disease: Secondary | ICD-10-CM | POA: Diagnosis not present

## 2016-10-13 DIAGNOSIS — D509 Iron deficiency anemia, unspecified: Secondary | ICD-10-CM | POA: Diagnosis not present

## 2016-10-13 DIAGNOSIS — N186 End stage renal disease: Secondary | ICD-10-CM | POA: Diagnosis not present

## 2016-10-15 DIAGNOSIS — N186 End stage renal disease: Secondary | ICD-10-CM | POA: Diagnosis not present

## 2016-10-15 DIAGNOSIS — D509 Iron deficiency anemia, unspecified: Secondary | ICD-10-CM | POA: Diagnosis not present

## 2016-10-15 DIAGNOSIS — E1122 Type 2 diabetes mellitus with diabetic chronic kidney disease: Secondary | ICD-10-CM | POA: Diagnosis not present

## 2016-10-15 DIAGNOSIS — D631 Anemia in chronic kidney disease: Secondary | ICD-10-CM | POA: Diagnosis not present

## 2016-10-18 DIAGNOSIS — N186 End stage renal disease: Secondary | ICD-10-CM | POA: Diagnosis not present

## 2016-10-18 DIAGNOSIS — E1122 Type 2 diabetes mellitus with diabetic chronic kidney disease: Secondary | ICD-10-CM | POA: Diagnosis not present

## 2016-10-18 DIAGNOSIS — D509 Iron deficiency anemia, unspecified: Secondary | ICD-10-CM | POA: Diagnosis not present

## 2016-10-18 DIAGNOSIS — D631 Anemia in chronic kidney disease: Secondary | ICD-10-CM | POA: Diagnosis not present

## 2016-10-20 DIAGNOSIS — D631 Anemia in chronic kidney disease: Secondary | ICD-10-CM | POA: Diagnosis not present

## 2016-10-20 DIAGNOSIS — N186 End stage renal disease: Secondary | ICD-10-CM | POA: Diagnosis not present

## 2016-10-20 DIAGNOSIS — D509 Iron deficiency anemia, unspecified: Secondary | ICD-10-CM | POA: Diagnosis not present

## 2016-10-20 DIAGNOSIS — E1122 Type 2 diabetes mellitus with diabetic chronic kidney disease: Secondary | ICD-10-CM | POA: Diagnosis not present

## 2016-10-22 DIAGNOSIS — D509 Iron deficiency anemia, unspecified: Secondary | ICD-10-CM | POA: Diagnosis not present

## 2016-10-22 DIAGNOSIS — D631 Anemia in chronic kidney disease: Secondary | ICD-10-CM | POA: Diagnosis not present

## 2016-10-22 DIAGNOSIS — E1122 Type 2 diabetes mellitus with diabetic chronic kidney disease: Secondary | ICD-10-CM | POA: Diagnosis not present

## 2016-10-22 DIAGNOSIS — N186 End stage renal disease: Secondary | ICD-10-CM | POA: Diagnosis not present

## 2016-10-24 DIAGNOSIS — N186 End stage renal disease: Secondary | ICD-10-CM | POA: Diagnosis not present

## 2016-10-24 DIAGNOSIS — E1122 Type 2 diabetes mellitus with diabetic chronic kidney disease: Secondary | ICD-10-CM | POA: Diagnosis not present

## 2016-10-24 DIAGNOSIS — D509 Iron deficiency anemia, unspecified: Secondary | ICD-10-CM | POA: Diagnosis not present

## 2016-10-24 DIAGNOSIS — D631 Anemia in chronic kidney disease: Secondary | ICD-10-CM | POA: Diagnosis not present

## 2016-10-26 DIAGNOSIS — D509 Iron deficiency anemia, unspecified: Secondary | ICD-10-CM | POA: Diagnosis not present

## 2016-10-26 DIAGNOSIS — D631 Anemia in chronic kidney disease: Secondary | ICD-10-CM | POA: Diagnosis not present

## 2016-10-26 DIAGNOSIS — N186 End stage renal disease: Secondary | ICD-10-CM | POA: Diagnosis not present

## 2016-10-26 DIAGNOSIS — E1122 Type 2 diabetes mellitus with diabetic chronic kidney disease: Secondary | ICD-10-CM | POA: Diagnosis not present

## 2016-10-29 DIAGNOSIS — D509 Iron deficiency anemia, unspecified: Secondary | ICD-10-CM | POA: Diagnosis not present

## 2016-10-29 DIAGNOSIS — D631 Anemia in chronic kidney disease: Secondary | ICD-10-CM | POA: Diagnosis not present

## 2016-10-29 DIAGNOSIS — E1122 Type 2 diabetes mellitus with diabetic chronic kidney disease: Secondary | ICD-10-CM | POA: Diagnosis not present

## 2016-10-29 DIAGNOSIS — N186 End stage renal disease: Secondary | ICD-10-CM | POA: Diagnosis not present

## 2016-11-01 DIAGNOSIS — N186 End stage renal disease: Secondary | ICD-10-CM | POA: Diagnosis not present

## 2016-11-01 DIAGNOSIS — E1122 Type 2 diabetes mellitus with diabetic chronic kidney disease: Secondary | ICD-10-CM | POA: Diagnosis not present

## 2016-11-01 DIAGNOSIS — D509 Iron deficiency anemia, unspecified: Secondary | ICD-10-CM | POA: Diagnosis not present

## 2016-11-01 DIAGNOSIS — D631 Anemia in chronic kidney disease: Secondary | ICD-10-CM | POA: Diagnosis not present

## 2016-11-03 DIAGNOSIS — D631 Anemia in chronic kidney disease: Secondary | ICD-10-CM | POA: Diagnosis not present

## 2016-11-03 DIAGNOSIS — E1122 Type 2 diabetes mellitus with diabetic chronic kidney disease: Secondary | ICD-10-CM | POA: Diagnosis not present

## 2016-11-03 DIAGNOSIS — D509 Iron deficiency anemia, unspecified: Secondary | ICD-10-CM | POA: Diagnosis not present

## 2016-11-03 DIAGNOSIS — N186 End stage renal disease: Secondary | ICD-10-CM | POA: Diagnosis not present

## 2016-11-04 DIAGNOSIS — E1122 Type 2 diabetes mellitus with diabetic chronic kidney disease: Secondary | ICD-10-CM | POA: Diagnosis not present

## 2016-11-04 DIAGNOSIS — N186 End stage renal disease: Secondary | ICD-10-CM | POA: Diagnosis not present

## 2016-11-04 DIAGNOSIS — Z992 Dependence on renal dialysis: Secondary | ICD-10-CM | POA: Diagnosis not present

## 2016-11-05 DIAGNOSIS — N186 End stage renal disease: Secondary | ICD-10-CM | POA: Diagnosis not present

## 2016-11-05 DIAGNOSIS — E1122 Type 2 diabetes mellitus with diabetic chronic kidney disease: Secondary | ICD-10-CM | POA: Diagnosis not present

## 2016-11-05 DIAGNOSIS — D509 Iron deficiency anemia, unspecified: Secondary | ICD-10-CM | POA: Diagnosis not present

## 2016-11-05 DIAGNOSIS — Z23 Encounter for immunization: Secondary | ICD-10-CM | POA: Diagnosis not present

## 2016-11-05 DIAGNOSIS — D631 Anemia in chronic kidney disease: Secondary | ICD-10-CM | POA: Diagnosis not present

## 2016-11-08 DIAGNOSIS — E1122 Type 2 diabetes mellitus with diabetic chronic kidney disease: Secondary | ICD-10-CM | POA: Diagnosis not present

## 2016-11-08 DIAGNOSIS — N186 End stage renal disease: Secondary | ICD-10-CM | POA: Diagnosis not present

## 2016-11-08 DIAGNOSIS — D509 Iron deficiency anemia, unspecified: Secondary | ICD-10-CM | POA: Diagnosis not present

## 2016-11-08 DIAGNOSIS — Z23 Encounter for immunization: Secondary | ICD-10-CM | POA: Diagnosis not present

## 2016-11-08 DIAGNOSIS — D631 Anemia in chronic kidney disease: Secondary | ICD-10-CM | POA: Diagnosis not present

## 2016-11-10 DIAGNOSIS — D631 Anemia in chronic kidney disease: Secondary | ICD-10-CM | POA: Diagnosis not present

## 2016-11-10 DIAGNOSIS — E1122 Type 2 diabetes mellitus with diabetic chronic kidney disease: Secondary | ICD-10-CM | POA: Diagnosis not present

## 2016-11-10 DIAGNOSIS — D509 Iron deficiency anemia, unspecified: Secondary | ICD-10-CM | POA: Diagnosis not present

## 2016-11-10 DIAGNOSIS — Z23 Encounter for immunization: Secondary | ICD-10-CM | POA: Diagnosis not present

## 2016-11-10 DIAGNOSIS — N186 End stage renal disease: Secondary | ICD-10-CM | POA: Diagnosis not present

## 2016-11-12 DIAGNOSIS — D631 Anemia in chronic kidney disease: Secondary | ICD-10-CM | POA: Diagnosis not present

## 2016-11-12 DIAGNOSIS — D509 Iron deficiency anemia, unspecified: Secondary | ICD-10-CM | POA: Diagnosis not present

## 2016-11-12 DIAGNOSIS — N186 End stage renal disease: Secondary | ICD-10-CM | POA: Diagnosis not present

## 2016-11-12 DIAGNOSIS — Z23 Encounter for immunization: Secondary | ICD-10-CM | POA: Diagnosis not present

## 2016-11-12 DIAGNOSIS — E1122 Type 2 diabetes mellitus with diabetic chronic kidney disease: Secondary | ICD-10-CM | POA: Diagnosis not present

## 2016-11-15 DIAGNOSIS — D509 Iron deficiency anemia, unspecified: Secondary | ICD-10-CM | POA: Diagnosis not present

## 2016-11-15 DIAGNOSIS — E1122 Type 2 diabetes mellitus with diabetic chronic kidney disease: Secondary | ICD-10-CM | POA: Diagnosis not present

## 2016-11-15 DIAGNOSIS — D631 Anemia in chronic kidney disease: Secondary | ICD-10-CM | POA: Diagnosis not present

## 2016-11-15 DIAGNOSIS — N186 End stage renal disease: Secondary | ICD-10-CM | POA: Diagnosis not present

## 2016-11-15 DIAGNOSIS — Z23 Encounter for immunization: Secondary | ICD-10-CM | POA: Diagnosis not present

## 2016-11-17 DIAGNOSIS — Z23 Encounter for immunization: Secondary | ICD-10-CM | POA: Diagnosis not present

## 2016-11-17 DIAGNOSIS — N186 End stage renal disease: Secondary | ICD-10-CM | POA: Diagnosis not present

## 2016-11-17 DIAGNOSIS — E1122 Type 2 diabetes mellitus with diabetic chronic kidney disease: Secondary | ICD-10-CM | POA: Diagnosis not present

## 2016-11-17 DIAGNOSIS — D509 Iron deficiency anemia, unspecified: Secondary | ICD-10-CM | POA: Diagnosis not present

## 2016-11-17 DIAGNOSIS — D631 Anemia in chronic kidney disease: Secondary | ICD-10-CM | POA: Diagnosis not present

## 2016-11-18 ENCOUNTER — Ambulatory Visit: Payer: Medicare Other | Admitting: Internal Medicine

## 2016-11-19 DIAGNOSIS — N186 End stage renal disease: Secondary | ICD-10-CM | POA: Diagnosis not present

## 2016-11-19 DIAGNOSIS — E1122 Type 2 diabetes mellitus with diabetic chronic kidney disease: Secondary | ICD-10-CM | POA: Diagnosis not present

## 2016-11-19 DIAGNOSIS — D509 Iron deficiency anemia, unspecified: Secondary | ICD-10-CM | POA: Diagnosis not present

## 2016-11-19 DIAGNOSIS — Z23 Encounter for immunization: Secondary | ICD-10-CM | POA: Diagnosis not present

## 2016-11-19 DIAGNOSIS — D631 Anemia in chronic kidney disease: Secondary | ICD-10-CM | POA: Diagnosis not present

## 2016-11-22 DIAGNOSIS — D509 Iron deficiency anemia, unspecified: Secondary | ICD-10-CM | POA: Diagnosis not present

## 2016-11-22 DIAGNOSIS — Z23 Encounter for immunization: Secondary | ICD-10-CM | POA: Diagnosis not present

## 2016-11-22 DIAGNOSIS — D631 Anemia in chronic kidney disease: Secondary | ICD-10-CM | POA: Diagnosis not present

## 2016-11-22 DIAGNOSIS — E1122 Type 2 diabetes mellitus with diabetic chronic kidney disease: Secondary | ICD-10-CM | POA: Diagnosis not present

## 2016-11-22 DIAGNOSIS — N186 End stage renal disease: Secondary | ICD-10-CM | POA: Diagnosis not present

## 2016-11-24 DIAGNOSIS — Z23 Encounter for immunization: Secondary | ICD-10-CM | POA: Diagnosis not present

## 2016-11-24 DIAGNOSIS — E1122 Type 2 diabetes mellitus with diabetic chronic kidney disease: Secondary | ICD-10-CM | POA: Diagnosis not present

## 2016-11-24 DIAGNOSIS — N186 End stage renal disease: Secondary | ICD-10-CM | POA: Diagnosis not present

## 2016-11-24 DIAGNOSIS — D509 Iron deficiency anemia, unspecified: Secondary | ICD-10-CM | POA: Diagnosis not present

## 2016-11-24 DIAGNOSIS — D631 Anemia in chronic kidney disease: Secondary | ICD-10-CM | POA: Diagnosis not present

## 2016-11-25 DIAGNOSIS — E113592 Type 2 diabetes mellitus with proliferative diabetic retinopathy without macular edema, left eye: Secondary | ICD-10-CM | POA: Diagnosis not present

## 2016-11-25 DIAGNOSIS — E113591 Type 2 diabetes mellitus with proliferative diabetic retinopathy without macular edema, right eye: Secondary | ICD-10-CM | POA: Diagnosis not present

## 2016-11-25 DIAGNOSIS — H35371 Puckering of macula, right eye: Secondary | ICD-10-CM | POA: Diagnosis not present

## 2016-11-26 DIAGNOSIS — N186 End stage renal disease: Secondary | ICD-10-CM | POA: Diagnosis not present

## 2016-11-26 DIAGNOSIS — Z23 Encounter for immunization: Secondary | ICD-10-CM | POA: Diagnosis not present

## 2016-11-26 DIAGNOSIS — E1122 Type 2 diabetes mellitus with diabetic chronic kidney disease: Secondary | ICD-10-CM | POA: Diagnosis not present

## 2016-11-26 DIAGNOSIS — D509 Iron deficiency anemia, unspecified: Secondary | ICD-10-CM | POA: Diagnosis not present

## 2016-11-26 DIAGNOSIS — D631 Anemia in chronic kidney disease: Secondary | ICD-10-CM | POA: Diagnosis not present

## 2016-11-28 DIAGNOSIS — Z23 Encounter for immunization: Secondary | ICD-10-CM | POA: Diagnosis not present

## 2016-11-28 DIAGNOSIS — D509 Iron deficiency anemia, unspecified: Secondary | ICD-10-CM | POA: Diagnosis not present

## 2016-11-28 DIAGNOSIS — E1122 Type 2 diabetes mellitus with diabetic chronic kidney disease: Secondary | ICD-10-CM | POA: Diagnosis not present

## 2016-11-28 DIAGNOSIS — N186 End stage renal disease: Secondary | ICD-10-CM | POA: Diagnosis not present

## 2016-11-28 DIAGNOSIS — D631 Anemia in chronic kidney disease: Secondary | ICD-10-CM | POA: Diagnosis not present

## 2016-12-01 DIAGNOSIS — E1122 Type 2 diabetes mellitus with diabetic chronic kidney disease: Secondary | ICD-10-CM | POA: Diagnosis not present

## 2016-12-01 DIAGNOSIS — D509 Iron deficiency anemia, unspecified: Secondary | ICD-10-CM | POA: Diagnosis not present

## 2016-12-01 DIAGNOSIS — Z23 Encounter for immunization: Secondary | ICD-10-CM | POA: Diagnosis not present

## 2016-12-01 DIAGNOSIS — D631 Anemia in chronic kidney disease: Secondary | ICD-10-CM | POA: Diagnosis not present

## 2016-12-01 DIAGNOSIS — N186 End stage renal disease: Secondary | ICD-10-CM | POA: Diagnosis not present

## 2016-12-03 DIAGNOSIS — Z23 Encounter for immunization: Secondary | ICD-10-CM | POA: Diagnosis not present

## 2016-12-03 DIAGNOSIS — D631 Anemia in chronic kidney disease: Secondary | ICD-10-CM | POA: Diagnosis not present

## 2016-12-03 DIAGNOSIS — E1122 Type 2 diabetes mellitus with diabetic chronic kidney disease: Secondary | ICD-10-CM | POA: Diagnosis not present

## 2016-12-03 DIAGNOSIS — D509 Iron deficiency anemia, unspecified: Secondary | ICD-10-CM | POA: Diagnosis not present

## 2016-12-03 DIAGNOSIS — N186 End stage renal disease: Secondary | ICD-10-CM | POA: Diagnosis not present

## 2016-12-05 DIAGNOSIS — Z23 Encounter for immunization: Secondary | ICD-10-CM | POA: Diagnosis not present

## 2016-12-05 DIAGNOSIS — N186 End stage renal disease: Secondary | ICD-10-CM | POA: Diagnosis not present

## 2016-12-05 DIAGNOSIS — D631 Anemia in chronic kidney disease: Secondary | ICD-10-CM | POA: Diagnosis not present

## 2016-12-05 DIAGNOSIS — D509 Iron deficiency anemia, unspecified: Secondary | ICD-10-CM | POA: Diagnosis not present

## 2016-12-05 DIAGNOSIS — Z992 Dependence on renal dialysis: Secondary | ICD-10-CM | POA: Diagnosis not present

## 2016-12-05 DIAGNOSIS — E1122 Type 2 diabetes mellitus with diabetic chronic kidney disease: Secondary | ICD-10-CM | POA: Diagnosis not present

## 2016-12-08 DIAGNOSIS — E1122 Type 2 diabetes mellitus with diabetic chronic kidney disease: Secondary | ICD-10-CM | POA: Diagnosis not present

## 2016-12-08 DIAGNOSIS — D509 Iron deficiency anemia, unspecified: Secondary | ICD-10-CM | POA: Diagnosis not present

## 2016-12-08 DIAGNOSIS — N186 End stage renal disease: Secondary | ICD-10-CM | POA: Diagnosis not present

## 2016-12-08 DIAGNOSIS — N2581 Secondary hyperparathyroidism of renal origin: Secondary | ICD-10-CM | POA: Diagnosis not present

## 2016-12-08 DIAGNOSIS — D631 Anemia in chronic kidney disease: Secondary | ICD-10-CM | POA: Diagnosis not present

## 2016-12-10 DIAGNOSIS — D631 Anemia in chronic kidney disease: Secondary | ICD-10-CM | POA: Diagnosis not present

## 2016-12-10 DIAGNOSIS — E1122 Type 2 diabetes mellitus with diabetic chronic kidney disease: Secondary | ICD-10-CM | POA: Diagnosis not present

## 2016-12-10 DIAGNOSIS — N2581 Secondary hyperparathyroidism of renal origin: Secondary | ICD-10-CM | POA: Diagnosis not present

## 2016-12-10 DIAGNOSIS — D509 Iron deficiency anemia, unspecified: Secondary | ICD-10-CM | POA: Diagnosis not present

## 2016-12-10 DIAGNOSIS — N186 End stage renal disease: Secondary | ICD-10-CM | POA: Diagnosis not present

## 2016-12-13 DIAGNOSIS — N186 End stage renal disease: Secondary | ICD-10-CM | POA: Diagnosis not present

## 2016-12-13 DIAGNOSIS — E1122 Type 2 diabetes mellitus with diabetic chronic kidney disease: Secondary | ICD-10-CM | POA: Diagnosis not present

## 2016-12-13 DIAGNOSIS — D631 Anemia in chronic kidney disease: Secondary | ICD-10-CM | POA: Diagnosis not present

## 2016-12-13 DIAGNOSIS — D509 Iron deficiency anemia, unspecified: Secondary | ICD-10-CM | POA: Diagnosis not present

## 2016-12-13 DIAGNOSIS — N2581 Secondary hyperparathyroidism of renal origin: Secondary | ICD-10-CM | POA: Diagnosis not present

## 2016-12-14 ENCOUNTER — Ambulatory Visit: Payer: Medicare Other | Admitting: Internal Medicine

## 2016-12-15 DIAGNOSIS — N2581 Secondary hyperparathyroidism of renal origin: Secondary | ICD-10-CM | POA: Diagnosis not present

## 2016-12-15 DIAGNOSIS — D509 Iron deficiency anemia, unspecified: Secondary | ICD-10-CM | POA: Diagnosis not present

## 2016-12-15 DIAGNOSIS — D631 Anemia in chronic kidney disease: Secondary | ICD-10-CM | POA: Diagnosis not present

## 2016-12-15 DIAGNOSIS — N186 End stage renal disease: Secondary | ICD-10-CM | POA: Diagnosis not present

## 2016-12-15 DIAGNOSIS — E1122 Type 2 diabetes mellitus with diabetic chronic kidney disease: Secondary | ICD-10-CM | POA: Diagnosis not present

## 2016-12-17 DIAGNOSIS — D509 Iron deficiency anemia, unspecified: Secondary | ICD-10-CM | POA: Diagnosis not present

## 2016-12-17 DIAGNOSIS — N186 End stage renal disease: Secondary | ICD-10-CM | POA: Diagnosis not present

## 2016-12-17 DIAGNOSIS — N2581 Secondary hyperparathyroidism of renal origin: Secondary | ICD-10-CM | POA: Diagnosis not present

## 2016-12-17 DIAGNOSIS — E1122 Type 2 diabetes mellitus with diabetic chronic kidney disease: Secondary | ICD-10-CM | POA: Diagnosis not present

## 2016-12-17 DIAGNOSIS — D631 Anemia in chronic kidney disease: Secondary | ICD-10-CM | POA: Diagnosis not present

## 2016-12-20 DIAGNOSIS — D509 Iron deficiency anemia, unspecified: Secondary | ICD-10-CM | POA: Diagnosis not present

## 2016-12-20 DIAGNOSIS — N186 End stage renal disease: Secondary | ICD-10-CM | POA: Diagnosis not present

## 2016-12-20 DIAGNOSIS — D631 Anemia in chronic kidney disease: Secondary | ICD-10-CM | POA: Diagnosis not present

## 2016-12-20 DIAGNOSIS — E1122 Type 2 diabetes mellitus with diabetic chronic kidney disease: Secondary | ICD-10-CM | POA: Diagnosis not present

## 2016-12-20 DIAGNOSIS — N2581 Secondary hyperparathyroidism of renal origin: Secondary | ICD-10-CM | POA: Diagnosis not present

## 2016-12-22 DIAGNOSIS — D509 Iron deficiency anemia, unspecified: Secondary | ICD-10-CM | POA: Diagnosis not present

## 2016-12-22 DIAGNOSIS — N2581 Secondary hyperparathyroidism of renal origin: Secondary | ICD-10-CM | POA: Diagnosis not present

## 2016-12-22 DIAGNOSIS — E1122 Type 2 diabetes mellitus with diabetic chronic kidney disease: Secondary | ICD-10-CM | POA: Diagnosis not present

## 2016-12-22 DIAGNOSIS — N186 End stage renal disease: Secondary | ICD-10-CM | POA: Diagnosis not present

## 2016-12-22 DIAGNOSIS — D631 Anemia in chronic kidney disease: Secondary | ICD-10-CM | POA: Diagnosis not present

## 2016-12-24 DIAGNOSIS — N186 End stage renal disease: Secondary | ICD-10-CM | POA: Diagnosis not present

## 2016-12-24 DIAGNOSIS — E1122 Type 2 diabetes mellitus with diabetic chronic kidney disease: Secondary | ICD-10-CM | POA: Diagnosis not present

## 2016-12-24 DIAGNOSIS — D631 Anemia in chronic kidney disease: Secondary | ICD-10-CM | POA: Diagnosis not present

## 2016-12-24 DIAGNOSIS — D509 Iron deficiency anemia, unspecified: Secondary | ICD-10-CM | POA: Diagnosis not present

## 2016-12-24 DIAGNOSIS — N2581 Secondary hyperparathyroidism of renal origin: Secondary | ICD-10-CM | POA: Diagnosis not present

## 2016-12-27 DIAGNOSIS — E1122 Type 2 diabetes mellitus with diabetic chronic kidney disease: Secondary | ICD-10-CM | POA: Diagnosis not present

## 2016-12-27 DIAGNOSIS — N186 End stage renal disease: Secondary | ICD-10-CM | POA: Diagnosis not present

## 2016-12-27 DIAGNOSIS — D509 Iron deficiency anemia, unspecified: Secondary | ICD-10-CM | POA: Diagnosis not present

## 2016-12-27 DIAGNOSIS — N2581 Secondary hyperparathyroidism of renal origin: Secondary | ICD-10-CM | POA: Diagnosis not present

## 2016-12-27 DIAGNOSIS — D631 Anemia in chronic kidney disease: Secondary | ICD-10-CM | POA: Diagnosis not present

## 2016-12-29 DIAGNOSIS — N186 End stage renal disease: Secondary | ICD-10-CM | POA: Diagnosis not present

## 2016-12-29 DIAGNOSIS — N2581 Secondary hyperparathyroidism of renal origin: Secondary | ICD-10-CM | POA: Diagnosis not present

## 2016-12-29 DIAGNOSIS — D509 Iron deficiency anemia, unspecified: Secondary | ICD-10-CM | POA: Diagnosis not present

## 2016-12-29 DIAGNOSIS — E1122 Type 2 diabetes mellitus with diabetic chronic kidney disease: Secondary | ICD-10-CM | POA: Diagnosis not present

## 2016-12-29 DIAGNOSIS — D631 Anemia in chronic kidney disease: Secondary | ICD-10-CM | POA: Diagnosis not present

## 2016-12-31 DIAGNOSIS — D509 Iron deficiency anemia, unspecified: Secondary | ICD-10-CM | POA: Diagnosis not present

## 2016-12-31 DIAGNOSIS — N186 End stage renal disease: Secondary | ICD-10-CM | POA: Diagnosis not present

## 2016-12-31 DIAGNOSIS — N2581 Secondary hyperparathyroidism of renal origin: Secondary | ICD-10-CM | POA: Diagnosis not present

## 2016-12-31 DIAGNOSIS — E1122 Type 2 diabetes mellitus with diabetic chronic kidney disease: Secondary | ICD-10-CM | POA: Diagnosis not present

## 2016-12-31 DIAGNOSIS — D631 Anemia in chronic kidney disease: Secondary | ICD-10-CM | POA: Diagnosis not present

## 2017-01-03 DIAGNOSIS — E1122 Type 2 diabetes mellitus with diabetic chronic kidney disease: Secondary | ICD-10-CM | POA: Diagnosis not present

## 2017-01-03 DIAGNOSIS — D509 Iron deficiency anemia, unspecified: Secondary | ICD-10-CM | POA: Diagnosis not present

## 2017-01-03 DIAGNOSIS — D631 Anemia in chronic kidney disease: Secondary | ICD-10-CM | POA: Diagnosis not present

## 2017-01-03 DIAGNOSIS — N186 End stage renal disease: Secondary | ICD-10-CM | POA: Diagnosis not present

## 2017-01-03 DIAGNOSIS — N2581 Secondary hyperparathyroidism of renal origin: Secondary | ICD-10-CM | POA: Diagnosis not present

## 2017-01-04 ENCOUNTER — Encounter: Payer: Self-pay | Admitting: Internal Medicine

## 2017-01-04 ENCOUNTER — Ambulatory Visit (INDEPENDENT_AMBULATORY_CARE_PROVIDER_SITE_OTHER): Payer: Medicare Other | Admitting: Internal Medicine

## 2017-01-04 VITALS — BP 122/60 | HR 96 | Temp 98.4°F | Ht 67.0 in | Wt 203.0 lb

## 2017-01-04 DIAGNOSIS — Z89511 Acquired absence of right leg below knee: Secondary | ICD-10-CM | POA: Insufficient documentation

## 2017-01-04 DIAGNOSIS — F1011 Alcohol abuse, in remission: Secondary | ICD-10-CM | POA: Insufficient documentation

## 2017-01-04 DIAGNOSIS — N186 End stage renal disease: Secondary | ICD-10-CM | POA: Insufficient documentation

## 2017-01-04 DIAGNOSIS — H548 Legal blindness, as defined in USA: Secondary | ICD-10-CM | POA: Insufficient documentation

## 2017-01-04 DIAGNOSIS — Z7689 Persons encountering health services in other specified circumstances: Secondary | ICD-10-CM | POA: Diagnosis not present

## 2017-01-04 NOTE — Patient Instructions (Signed)
It was so nice to meet you!  Everything looks good today. We will see you back in the next month or so for a diabetes visit.  -Dr. Brett Albino

## 2017-01-04 NOTE — Progress Notes (Signed)
   [facility] Phone: [phone]  Subjective:  [name] is a 51 year old female presenting to clinic for a new patient appointment. She has no concerns today.  ROS: No pain, no lower extremity edema, no chest pain, no shortness of breath  Past Medical History- HTN, T2DM, R BKA, ESRD since 5/17  Past Surgical History- C section (1985, 1986, 1991), tubal ligation 1991, Right BKA 2013, Left thumb amputation 2013, Right 2nd digit amputation, R cataract surgery (unknown date)  Family history-  -Mother- diabetes, alcohol abuse -Father- Alzheimer's disease, diabetes, HTN -Sister- diabetes -Son- asthma  Social history- patient is a former smoker (smoked for a couple years as a teenager). Hx of alcohol abuse over 10 years ago. Drank heavily for ~5 years. Hx of cocaine use many, many years ago.  Medications- Dialyvite, Amlodipine 10mg  daily, Lipitor 20mg  daily, Gabapentin 100mg  tid, Losartan 100mg  daily, Humulin 70/30 10 units twice a day.  Objective: BP 122/60 (BP Location: Left Arm, Patient Position: Sitting, Cuff Size: Large)   Pulse 96   Temp 98.4 F (36.9 C) (Oral)   Ht 5\' 7"  (1.702 m)   Wt 203 lb (92.1 kg)   LMP 01/01/2017 (Exact Date)   SpO2 99%   BMI 31.79 kg/m  Gen: NAD, alert, cooperative with exam HEENT: NCAT, MMM Neck: FROM, supple, no cervical lymphadenopathy CV: RRR, I/VI systolic murmur heard loudest in the LUSB Resp: CTABL, no wheezes, normal work of breathing GI: SNTND, BS present, no guarding or organomegaly Msk: Well-healed R BKA, no edema in LLE, well-healed amputation of right 2nd digit and left thumb Neuro: Alert and oriented, no gross deficits Skin: No rashes, no lesions Psych: Appropriate behavior  Assessment/Plan: Encounter to establish care with a new doctor: Past medical history, surgical history, family history, and medications reviewed - Pt will follow-up within the next month for a diabetes visit and pap smear - She has already  received the flu shot this year   Hyman Bible, MD PGY-2

## 2017-01-05 DIAGNOSIS — D631 Anemia in chronic kidney disease: Secondary | ICD-10-CM | POA: Diagnosis not present

## 2017-01-05 DIAGNOSIS — N186 End stage renal disease: Secondary | ICD-10-CM | POA: Diagnosis not present

## 2017-01-05 DIAGNOSIS — Z992 Dependence on renal dialysis: Secondary | ICD-10-CM | POA: Diagnosis not present

## 2017-01-05 DIAGNOSIS — E1122 Type 2 diabetes mellitus with diabetic chronic kidney disease: Secondary | ICD-10-CM | POA: Diagnosis not present

## 2017-01-05 DIAGNOSIS — N2581 Secondary hyperparathyroidism of renal origin: Secondary | ICD-10-CM | POA: Diagnosis not present

## 2017-01-05 DIAGNOSIS — D509 Iron deficiency anemia, unspecified: Secondary | ICD-10-CM | POA: Diagnosis not present

## 2017-01-07 DIAGNOSIS — N186 End stage renal disease: Secondary | ICD-10-CM | POA: Diagnosis not present

## 2017-01-07 DIAGNOSIS — E1122 Type 2 diabetes mellitus with diabetic chronic kidney disease: Secondary | ICD-10-CM | POA: Diagnosis not present

## 2017-01-07 DIAGNOSIS — D631 Anemia in chronic kidney disease: Secondary | ICD-10-CM | POA: Diagnosis not present

## 2017-01-07 DIAGNOSIS — D509 Iron deficiency anemia, unspecified: Secondary | ICD-10-CM | POA: Diagnosis not present

## 2017-01-07 DIAGNOSIS — N2581 Secondary hyperparathyroidism of renal origin: Secondary | ICD-10-CM | POA: Diagnosis not present

## 2017-01-10 DIAGNOSIS — D631 Anemia in chronic kidney disease: Secondary | ICD-10-CM | POA: Diagnosis not present

## 2017-01-10 DIAGNOSIS — D509 Iron deficiency anemia, unspecified: Secondary | ICD-10-CM | POA: Diagnosis not present

## 2017-01-10 DIAGNOSIS — N186 End stage renal disease: Secondary | ICD-10-CM | POA: Diagnosis not present

## 2017-01-10 DIAGNOSIS — N2581 Secondary hyperparathyroidism of renal origin: Secondary | ICD-10-CM | POA: Diagnosis not present

## 2017-01-10 DIAGNOSIS — E1122 Type 2 diabetes mellitus with diabetic chronic kidney disease: Secondary | ICD-10-CM | POA: Diagnosis not present

## 2017-01-12 DIAGNOSIS — N186 End stage renal disease: Secondary | ICD-10-CM | POA: Diagnosis not present

## 2017-01-12 DIAGNOSIS — D631 Anemia in chronic kidney disease: Secondary | ICD-10-CM | POA: Diagnosis not present

## 2017-01-12 DIAGNOSIS — E1122 Type 2 diabetes mellitus with diabetic chronic kidney disease: Secondary | ICD-10-CM | POA: Diagnosis not present

## 2017-01-12 DIAGNOSIS — N2581 Secondary hyperparathyroidism of renal origin: Secondary | ICD-10-CM | POA: Diagnosis not present

## 2017-01-12 DIAGNOSIS — D509 Iron deficiency anemia, unspecified: Secondary | ICD-10-CM | POA: Diagnosis not present

## 2017-01-13 DIAGNOSIS — H1820 Unspecified corneal edema: Secondary | ICD-10-CM | POA: Diagnosis not present

## 2017-01-14 DIAGNOSIS — N186 End stage renal disease: Secondary | ICD-10-CM | POA: Diagnosis not present

## 2017-01-14 DIAGNOSIS — N2581 Secondary hyperparathyroidism of renal origin: Secondary | ICD-10-CM | POA: Diagnosis not present

## 2017-01-14 DIAGNOSIS — D509 Iron deficiency anemia, unspecified: Secondary | ICD-10-CM | POA: Diagnosis not present

## 2017-01-14 DIAGNOSIS — D631 Anemia in chronic kidney disease: Secondary | ICD-10-CM | POA: Diagnosis not present

## 2017-01-14 DIAGNOSIS — E1122 Type 2 diabetes mellitus with diabetic chronic kidney disease: Secondary | ICD-10-CM | POA: Diagnosis not present

## 2017-01-17 DIAGNOSIS — D631 Anemia in chronic kidney disease: Secondary | ICD-10-CM | POA: Diagnosis not present

## 2017-01-17 DIAGNOSIS — E1122 Type 2 diabetes mellitus with diabetic chronic kidney disease: Secondary | ICD-10-CM | POA: Diagnosis not present

## 2017-01-17 DIAGNOSIS — N186 End stage renal disease: Secondary | ICD-10-CM | POA: Diagnosis not present

## 2017-01-17 DIAGNOSIS — N2581 Secondary hyperparathyroidism of renal origin: Secondary | ICD-10-CM | POA: Diagnosis not present

## 2017-01-17 DIAGNOSIS — D509 Iron deficiency anemia, unspecified: Secondary | ICD-10-CM | POA: Diagnosis not present

## 2017-01-18 DIAGNOSIS — Z992 Dependence on renal dialysis: Secondary | ICD-10-CM | POA: Diagnosis not present

## 2017-01-18 DIAGNOSIS — I871 Compression of vein: Secondary | ICD-10-CM | POA: Diagnosis not present

## 2017-01-18 DIAGNOSIS — N186 End stage renal disease: Secondary | ICD-10-CM | POA: Diagnosis not present

## 2017-01-18 DIAGNOSIS — T82858A Stenosis of vascular prosthetic devices, implants and grafts, initial encounter: Secondary | ICD-10-CM | POA: Diagnosis not present

## 2017-01-19 DIAGNOSIS — N2581 Secondary hyperparathyroidism of renal origin: Secondary | ICD-10-CM | POA: Diagnosis not present

## 2017-01-19 DIAGNOSIS — D509 Iron deficiency anemia, unspecified: Secondary | ICD-10-CM | POA: Diagnosis not present

## 2017-01-19 DIAGNOSIS — E1122 Type 2 diabetes mellitus with diabetic chronic kidney disease: Secondary | ICD-10-CM | POA: Diagnosis not present

## 2017-01-19 DIAGNOSIS — D631 Anemia in chronic kidney disease: Secondary | ICD-10-CM | POA: Diagnosis not present

## 2017-01-19 DIAGNOSIS — N186 End stage renal disease: Secondary | ICD-10-CM | POA: Diagnosis not present

## 2017-01-21 DIAGNOSIS — N186 End stage renal disease: Secondary | ICD-10-CM | POA: Diagnosis not present

## 2017-01-21 DIAGNOSIS — D509 Iron deficiency anemia, unspecified: Secondary | ICD-10-CM | POA: Diagnosis not present

## 2017-01-21 DIAGNOSIS — D631 Anemia in chronic kidney disease: Secondary | ICD-10-CM | POA: Diagnosis not present

## 2017-01-21 DIAGNOSIS — N2581 Secondary hyperparathyroidism of renal origin: Secondary | ICD-10-CM | POA: Diagnosis not present

## 2017-01-21 DIAGNOSIS — E1122 Type 2 diabetes mellitus with diabetic chronic kidney disease: Secondary | ICD-10-CM | POA: Diagnosis not present

## 2017-01-24 DIAGNOSIS — D631 Anemia in chronic kidney disease: Secondary | ICD-10-CM | POA: Diagnosis not present

## 2017-01-24 DIAGNOSIS — N2581 Secondary hyperparathyroidism of renal origin: Secondary | ICD-10-CM | POA: Diagnosis not present

## 2017-01-24 DIAGNOSIS — N186 End stage renal disease: Secondary | ICD-10-CM | POA: Diagnosis not present

## 2017-01-24 DIAGNOSIS — D509 Iron deficiency anemia, unspecified: Secondary | ICD-10-CM | POA: Diagnosis not present

## 2017-01-24 DIAGNOSIS — E1122 Type 2 diabetes mellitus with diabetic chronic kidney disease: Secondary | ICD-10-CM | POA: Diagnosis not present

## 2017-01-26 DIAGNOSIS — D631 Anemia in chronic kidney disease: Secondary | ICD-10-CM | POA: Diagnosis not present

## 2017-01-26 DIAGNOSIS — N186 End stage renal disease: Secondary | ICD-10-CM | POA: Diagnosis not present

## 2017-01-26 DIAGNOSIS — N2581 Secondary hyperparathyroidism of renal origin: Secondary | ICD-10-CM | POA: Diagnosis not present

## 2017-01-26 DIAGNOSIS — E1122 Type 2 diabetes mellitus with diabetic chronic kidney disease: Secondary | ICD-10-CM | POA: Diagnosis not present

## 2017-01-26 DIAGNOSIS — D509 Iron deficiency anemia, unspecified: Secondary | ICD-10-CM | POA: Diagnosis not present

## 2017-01-28 DIAGNOSIS — E1122 Type 2 diabetes mellitus with diabetic chronic kidney disease: Secondary | ICD-10-CM | POA: Diagnosis not present

## 2017-01-28 DIAGNOSIS — N186 End stage renal disease: Secondary | ICD-10-CM | POA: Diagnosis not present

## 2017-01-28 DIAGNOSIS — D509 Iron deficiency anemia, unspecified: Secondary | ICD-10-CM | POA: Diagnosis not present

## 2017-01-28 DIAGNOSIS — D631 Anemia in chronic kidney disease: Secondary | ICD-10-CM | POA: Diagnosis not present

## 2017-01-28 DIAGNOSIS — N2581 Secondary hyperparathyroidism of renal origin: Secondary | ICD-10-CM | POA: Diagnosis not present

## 2017-01-31 DIAGNOSIS — N2581 Secondary hyperparathyroidism of renal origin: Secondary | ICD-10-CM | POA: Diagnosis not present

## 2017-01-31 DIAGNOSIS — D509 Iron deficiency anemia, unspecified: Secondary | ICD-10-CM | POA: Diagnosis not present

## 2017-01-31 DIAGNOSIS — E1122 Type 2 diabetes mellitus with diabetic chronic kidney disease: Secondary | ICD-10-CM | POA: Diagnosis not present

## 2017-01-31 DIAGNOSIS — D631 Anemia in chronic kidney disease: Secondary | ICD-10-CM | POA: Diagnosis not present

## 2017-01-31 DIAGNOSIS — N186 End stage renal disease: Secondary | ICD-10-CM | POA: Diagnosis not present

## 2017-02-02 DIAGNOSIS — Z992 Dependence on renal dialysis: Secondary | ICD-10-CM | POA: Diagnosis not present

## 2017-02-02 DIAGNOSIS — N186 End stage renal disease: Secondary | ICD-10-CM | POA: Diagnosis not present

## 2017-02-02 DIAGNOSIS — N2581 Secondary hyperparathyroidism of renal origin: Secondary | ICD-10-CM | POA: Diagnosis not present

## 2017-02-02 DIAGNOSIS — E1122 Type 2 diabetes mellitus with diabetic chronic kidney disease: Secondary | ICD-10-CM | POA: Diagnosis not present

## 2017-02-02 DIAGNOSIS — D509 Iron deficiency anemia, unspecified: Secondary | ICD-10-CM | POA: Diagnosis not present

## 2017-02-02 DIAGNOSIS — D631 Anemia in chronic kidney disease: Secondary | ICD-10-CM | POA: Diagnosis not present

## 2017-02-04 DIAGNOSIS — E1122 Type 2 diabetes mellitus with diabetic chronic kidney disease: Secondary | ICD-10-CM | POA: Diagnosis not present

## 2017-02-04 DIAGNOSIS — N2581 Secondary hyperparathyroidism of renal origin: Secondary | ICD-10-CM | POA: Diagnosis not present

## 2017-02-04 DIAGNOSIS — D631 Anemia in chronic kidney disease: Secondary | ICD-10-CM | POA: Diagnosis not present

## 2017-02-04 DIAGNOSIS — N186 End stage renal disease: Secondary | ICD-10-CM | POA: Diagnosis not present

## 2017-02-04 DIAGNOSIS — D509 Iron deficiency anemia, unspecified: Secondary | ICD-10-CM | POA: Diagnosis not present

## 2017-02-07 DIAGNOSIS — N186 End stage renal disease: Secondary | ICD-10-CM | POA: Diagnosis not present

## 2017-02-07 DIAGNOSIS — D631 Anemia in chronic kidney disease: Secondary | ICD-10-CM | POA: Diagnosis not present

## 2017-02-07 DIAGNOSIS — N2581 Secondary hyperparathyroidism of renal origin: Secondary | ICD-10-CM | POA: Diagnosis not present

## 2017-02-07 DIAGNOSIS — E1122 Type 2 diabetes mellitus with diabetic chronic kidney disease: Secondary | ICD-10-CM | POA: Diagnosis not present

## 2017-02-07 DIAGNOSIS — D509 Iron deficiency anemia, unspecified: Secondary | ICD-10-CM | POA: Diagnosis not present

## 2017-02-09 DIAGNOSIS — D509 Iron deficiency anemia, unspecified: Secondary | ICD-10-CM | POA: Diagnosis not present

## 2017-02-09 DIAGNOSIS — N186 End stage renal disease: Secondary | ICD-10-CM | POA: Diagnosis not present

## 2017-02-09 DIAGNOSIS — E1122 Type 2 diabetes mellitus with diabetic chronic kidney disease: Secondary | ICD-10-CM | POA: Diagnosis not present

## 2017-02-09 DIAGNOSIS — N2581 Secondary hyperparathyroidism of renal origin: Secondary | ICD-10-CM | POA: Diagnosis not present

## 2017-02-09 DIAGNOSIS — D631 Anemia in chronic kidney disease: Secondary | ICD-10-CM | POA: Diagnosis not present

## 2017-02-11 DIAGNOSIS — N186 End stage renal disease: Secondary | ICD-10-CM | POA: Diagnosis not present

## 2017-02-11 DIAGNOSIS — E1122 Type 2 diabetes mellitus with diabetic chronic kidney disease: Secondary | ICD-10-CM | POA: Diagnosis not present

## 2017-02-11 DIAGNOSIS — D509 Iron deficiency anemia, unspecified: Secondary | ICD-10-CM | POA: Diagnosis not present

## 2017-02-11 DIAGNOSIS — D631 Anemia in chronic kidney disease: Secondary | ICD-10-CM | POA: Diagnosis not present

## 2017-02-11 DIAGNOSIS — N2581 Secondary hyperparathyroidism of renal origin: Secondary | ICD-10-CM | POA: Diagnosis not present

## 2017-02-14 DIAGNOSIS — E1122 Type 2 diabetes mellitus with diabetic chronic kidney disease: Secondary | ICD-10-CM | POA: Diagnosis not present

## 2017-02-14 DIAGNOSIS — N2581 Secondary hyperparathyroidism of renal origin: Secondary | ICD-10-CM | POA: Diagnosis not present

## 2017-02-14 DIAGNOSIS — D509 Iron deficiency anemia, unspecified: Secondary | ICD-10-CM | POA: Diagnosis not present

## 2017-02-14 DIAGNOSIS — D631 Anemia in chronic kidney disease: Secondary | ICD-10-CM | POA: Diagnosis not present

## 2017-02-14 DIAGNOSIS — N186 End stage renal disease: Secondary | ICD-10-CM | POA: Diagnosis not present

## 2017-02-16 DIAGNOSIS — N186 End stage renal disease: Secondary | ICD-10-CM | POA: Diagnosis not present

## 2017-02-16 DIAGNOSIS — D631 Anemia in chronic kidney disease: Secondary | ICD-10-CM | POA: Diagnosis not present

## 2017-02-16 DIAGNOSIS — D509 Iron deficiency anemia, unspecified: Secondary | ICD-10-CM | POA: Diagnosis not present

## 2017-02-16 DIAGNOSIS — N2581 Secondary hyperparathyroidism of renal origin: Secondary | ICD-10-CM | POA: Diagnosis not present

## 2017-02-16 DIAGNOSIS — E1122 Type 2 diabetes mellitus with diabetic chronic kidney disease: Secondary | ICD-10-CM | POA: Diagnosis not present

## 2017-02-17 ENCOUNTER — Ambulatory Visit: Payer: Medicare Other | Admitting: Internal Medicine

## 2017-02-18 DIAGNOSIS — N186 End stage renal disease: Secondary | ICD-10-CM | POA: Diagnosis not present

## 2017-02-18 DIAGNOSIS — E1122 Type 2 diabetes mellitus with diabetic chronic kidney disease: Secondary | ICD-10-CM | POA: Diagnosis not present

## 2017-02-18 DIAGNOSIS — N2581 Secondary hyperparathyroidism of renal origin: Secondary | ICD-10-CM | POA: Diagnosis not present

## 2017-02-18 DIAGNOSIS — D509 Iron deficiency anemia, unspecified: Secondary | ICD-10-CM | POA: Diagnosis not present

## 2017-02-18 DIAGNOSIS — D631 Anemia in chronic kidney disease: Secondary | ICD-10-CM | POA: Diagnosis not present

## 2017-02-21 DIAGNOSIS — D509 Iron deficiency anemia, unspecified: Secondary | ICD-10-CM | POA: Diagnosis not present

## 2017-02-21 DIAGNOSIS — N186 End stage renal disease: Secondary | ICD-10-CM | POA: Diagnosis not present

## 2017-02-21 DIAGNOSIS — D631 Anemia in chronic kidney disease: Secondary | ICD-10-CM | POA: Diagnosis not present

## 2017-02-21 DIAGNOSIS — E1122 Type 2 diabetes mellitus with diabetic chronic kidney disease: Secondary | ICD-10-CM | POA: Diagnosis not present

## 2017-02-21 DIAGNOSIS — N2581 Secondary hyperparathyroidism of renal origin: Secondary | ICD-10-CM | POA: Diagnosis not present

## 2017-02-23 DIAGNOSIS — D631 Anemia in chronic kidney disease: Secondary | ICD-10-CM | POA: Diagnosis not present

## 2017-02-23 DIAGNOSIS — D509 Iron deficiency anemia, unspecified: Secondary | ICD-10-CM | POA: Diagnosis not present

## 2017-02-23 DIAGNOSIS — E1122 Type 2 diabetes mellitus with diabetic chronic kidney disease: Secondary | ICD-10-CM | POA: Diagnosis not present

## 2017-02-23 DIAGNOSIS — N186 End stage renal disease: Secondary | ICD-10-CM | POA: Diagnosis not present

## 2017-02-23 DIAGNOSIS — N2581 Secondary hyperparathyroidism of renal origin: Secondary | ICD-10-CM | POA: Diagnosis not present

## 2017-02-25 DIAGNOSIS — N2581 Secondary hyperparathyroidism of renal origin: Secondary | ICD-10-CM | POA: Diagnosis not present

## 2017-02-25 DIAGNOSIS — D631 Anemia in chronic kidney disease: Secondary | ICD-10-CM | POA: Diagnosis not present

## 2017-02-25 DIAGNOSIS — N186 End stage renal disease: Secondary | ICD-10-CM | POA: Diagnosis not present

## 2017-02-25 DIAGNOSIS — E1122 Type 2 diabetes mellitus with diabetic chronic kidney disease: Secondary | ICD-10-CM | POA: Diagnosis not present

## 2017-02-25 DIAGNOSIS — D509 Iron deficiency anemia, unspecified: Secondary | ICD-10-CM | POA: Diagnosis not present

## 2017-02-28 DIAGNOSIS — D509 Iron deficiency anemia, unspecified: Secondary | ICD-10-CM | POA: Diagnosis not present

## 2017-02-28 DIAGNOSIS — E1122 Type 2 diabetes mellitus with diabetic chronic kidney disease: Secondary | ICD-10-CM | POA: Diagnosis not present

## 2017-02-28 DIAGNOSIS — D631 Anemia in chronic kidney disease: Secondary | ICD-10-CM | POA: Diagnosis not present

## 2017-02-28 DIAGNOSIS — N186 End stage renal disease: Secondary | ICD-10-CM | POA: Diagnosis not present

## 2017-02-28 DIAGNOSIS — N2581 Secondary hyperparathyroidism of renal origin: Secondary | ICD-10-CM | POA: Diagnosis not present

## 2017-03-01 ENCOUNTER — Other Ambulatory Visit: Payer: Self-pay | Admitting: *Deleted

## 2017-03-02 DIAGNOSIS — E1122 Type 2 diabetes mellitus with diabetic chronic kidney disease: Secondary | ICD-10-CM | POA: Diagnosis not present

## 2017-03-02 DIAGNOSIS — N186 End stage renal disease: Secondary | ICD-10-CM | POA: Diagnosis not present

## 2017-03-02 DIAGNOSIS — D509 Iron deficiency anemia, unspecified: Secondary | ICD-10-CM | POA: Diagnosis not present

## 2017-03-02 DIAGNOSIS — D631 Anemia in chronic kidney disease: Secondary | ICD-10-CM | POA: Diagnosis not present

## 2017-03-02 DIAGNOSIS — N2581 Secondary hyperparathyroidism of renal origin: Secondary | ICD-10-CM | POA: Diagnosis not present

## 2017-03-02 MED ORDER — ATORVASTATIN CALCIUM 20 MG PO TABS: 20.0000 mg | ORAL_TABLET | Freq: Every day | ORAL | 0 refills | Status: DC

## 2017-03-03 DIAGNOSIS — H1820 Unspecified corneal edema: Secondary | ICD-10-CM | POA: Diagnosis not present

## 2017-03-04 DIAGNOSIS — E1122 Type 2 diabetes mellitus with diabetic chronic kidney disease: Secondary | ICD-10-CM | POA: Diagnosis not present

## 2017-03-04 DIAGNOSIS — N186 End stage renal disease: Secondary | ICD-10-CM | POA: Diagnosis not present

## 2017-03-04 DIAGNOSIS — D509 Iron deficiency anemia, unspecified: Secondary | ICD-10-CM | POA: Diagnosis not present

## 2017-03-04 DIAGNOSIS — D631 Anemia in chronic kidney disease: Secondary | ICD-10-CM | POA: Diagnosis not present

## 2017-03-04 DIAGNOSIS — N2581 Secondary hyperparathyroidism of renal origin: Secondary | ICD-10-CM | POA: Diagnosis not present

## 2017-03-05 DIAGNOSIS — E1122 Type 2 diabetes mellitus with diabetic chronic kidney disease: Secondary | ICD-10-CM | POA: Diagnosis not present

## 2017-03-05 DIAGNOSIS — N186 End stage renal disease: Secondary | ICD-10-CM | POA: Diagnosis not present

## 2017-03-05 DIAGNOSIS — Z992 Dependence on renal dialysis: Secondary | ICD-10-CM | POA: Diagnosis not present

## 2017-03-07 DIAGNOSIS — N2581 Secondary hyperparathyroidism of renal origin: Secondary | ICD-10-CM | POA: Diagnosis not present

## 2017-03-07 DIAGNOSIS — N186 End stage renal disease: Secondary | ICD-10-CM | POA: Diagnosis not present

## 2017-03-07 DIAGNOSIS — D509 Iron deficiency anemia, unspecified: Secondary | ICD-10-CM | POA: Diagnosis not present

## 2017-03-07 DIAGNOSIS — E1122 Type 2 diabetes mellitus with diabetic chronic kidney disease: Secondary | ICD-10-CM | POA: Diagnosis not present

## 2017-03-07 DIAGNOSIS — D631 Anemia in chronic kidney disease: Secondary | ICD-10-CM | POA: Diagnosis not present

## 2017-03-09 DIAGNOSIS — D631 Anemia in chronic kidney disease: Secondary | ICD-10-CM | POA: Diagnosis not present

## 2017-03-09 DIAGNOSIS — D509 Iron deficiency anemia, unspecified: Secondary | ICD-10-CM | POA: Diagnosis not present

## 2017-03-09 DIAGNOSIS — N2581 Secondary hyperparathyroidism of renal origin: Secondary | ICD-10-CM | POA: Diagnosis not present

## 2017-03-09 DIAGNOSIS — E1122 Type 2 diabetes mellitus with diabetic chronic kidney disease: Secondary | ICD-10-CM | POA: Diagnosis not present

## 2017-03-09 DIAGNOSIS — N186 End stage renal disease: Secondary | ICD-10-CM | POA: Diagnosis not present

## 2017-03-11 DIAGNOSIS — N2581 Secondary hyperparathyroidism of renal origin: Secondary | ICD-10-CM | POA: Diagnosis not present

## 2017-03-11 DIAGNOSIS — D509 Iron deficiency anemia, unspecified: Secondary | ICD-10-CM | POA: Diagnosis not present

## 2017-03-11 DIAGNOSIS — D631 Anemia in chronic kidney disease: Secondary | ICD-10-CM | POA: Diagnosis not present

## 2017-03-11 DIAGNOSIS — N186 End stage renal disease: Secondary | ICD-10-CM | POA: Diagnosis not present

## 2017-03-11 DIAGNOSIS — E1122 Type 2 diabetes mellitus with diabetic chronic kidney disease: Secondary | ICD-10-CM | POA: Diagnosis not present

## 2017-03-14 ENCOUNTER — Ambulatory Visit: Payer: Medicare Other | Admitting: Internal Medicine

## 2017-03-14 DIAGNOSIS — E1122 Type 2 diabetes mellitus with diabetic chronic kidney disease: Secondary | ICD-10-CM | POA: Diagnosis not present

## 2017-03-14 DIAGNOSIS — D509 Iron deficiency anemia, unspecified: Secondary | ICD-10-CM | POA: Diagnosis not present

## 2017-03-14 DIAGNOSIS — D631 Anemia in chronic kidney disease: Secondary | ICD-10-CM | POA: Diagnosis not present

## 2017-03-14 DIAGNOSIS — N186 End stage renal disease: Secondary | ICD-10-CM | POA: Diagnosis not present

## 2017-03-14 DIAGNOSIS — N2581 Secondary hyperparathyroidism of renal origin: Secondary | ICD-10-CM | POA: Diagnosis not present

## 2017-03-16 DIAGNOSIS — E1122 Type 2 diabetes mellitus with diabetic chronic kidney disease: Secondary | ICD-10-CM | POA: Diagnosis not present

## 2017-03-16 DIAGNOSIS — N186 End stage renal disease: Secondary | ICD-10-CM | POA: Diagnosis not present

## 2017-03-16 DIAGNOSIS — N2581 Secondary hyperparathyroidism of renal origin: Secondary | ICD-10-CM | POA: Diagnosis not present

## 2017-03-16 DIAGNOSIS — D631 Anemia in chronic kidney disease: Secondary | ICD-10-CM | POA: Diagnosis not present

## 2017-03-16 DIAGNOSIS — D509 Iron deficiency anemia, unspecified: Secondary | ICD-10-CM | POA: Diagnosis not present

## 2017-03-17 ENCOUNTER — Encounter: Payer: Self-pay | Admitting: Internal Medicine

## 2017-03-17 ENCOUNTER — Ambulatory Visit (INDEPENDENT_AMBULATORY_CARE_PROVIDER_SITE_OTHER): Payer: Medicare Other | Admitting: Internal Medicine

## 2017-03-17 VITALS — BP 132/60 | HR 95 | Temp 98.0°F | Ht 67.0 in | Wt 203.0 lb

## 2017-03-17 DIAGNOSIS — E1165 Type 2 diabetes mellitus with hyperglycemia: Secondary | ICD-10-CM

## 2017-03-17 DIAGNOSIS — E7849 Other hyperlipidemia: Secondary | ICD-10-CM

## 2017-03-17 DIAGNOSIS — E785 Hyperlipidemia, unspecified: Secondary | ICD-10-CM | POA: Insufficient documentation

## 2017-03-17 DIAGNOSIS — Z794 Long term (current) use of insulin: Secondary | ICD-10-CM

## 2017-03-17 DIAGNOSIS — E1142 Type 2 diabetes mellitus with diabetic polyneuropathy: Secondary | ICD-10-CM | POA: Diagnosis not present

## 2017-03-17 DIAGNOSIS — E1139 Type 2 diabetes mellitus with other diabetic ophthalmic complication: Secondary | ICD-10-CM | POA: Diagnosis not present

## 2017-03-17 DIAGNOSIS — I10 Essential (primary) hypertension: Secondary | ICD-10-CM | POA: Diagnosis not present

## 2017-03-17 DIAGNOSIS — E784 Other hyperlipidemia: Secondary | ICD-10-CM

## 2017-03-17 DIAGNOSIS — R7309 Other abnormal glucose: Secondary | ICD-10-CM | POA: Diagnosis not present

## 2017-03-17 DIAGNOSIS — IMO0002 Reserved for concepts with insufficient information to code with codable children: Secondary | ICD-10-CM

## 2017-03-17 LAB — POCT GLYCOSYLATED HEMOGLOBIN (HGB A1C): Hemoglobin A1C: 7.4

## 2017-03-17 MED ORDER — GABAPENTIN 300 MG PO CAPS: 300.0000 mg | ORAL_CAPSULE | Freq: Every day | ORAL | Status: DC

## 2017-03-17 MED ORDER — ATORVASTATIN CALCIUM 40 MG PO TABS: 40.0000 mg | ORAL_TABLET | Freq: Every day | ORAL | 3 refills | Status: DC

## 2017-03-17 MED ORDER — METOPROLOL TARTRATE 50 MG PO TABS: 75.0000 mg | ORAL_TABLET | Freq: Two times a day (BID) | ORAL | 3 refills | Status: DC

## 2017-03-17 MED ORDER — NORTRIPTYLINE HCL 25 MG PO CAPS: 25.0000 mg | ORAL_CAPSULE | Freq: Every day | ORAL | 0 refills | Status: DC

## 2017-03-17 NOTE — Progress Notes (Signed)
   [facility] Phone: [phone]  Subjective:  [name] is a 51 year old female presenting to clinic for follow-up of her chronic medical conditions.  T2DM: Taking NPH/regular insulin 70/30 10 units bid. Checking CBGs once daily. CBGs have been in the 130s-150s. No lows. No shakiness or sweatiness. Follows closely with ophthalmology. Patient states she is drinking 2 cans of soda per day.  Peripheral Neuropathy: States this has been getting worse recently. Notes "sharp, shooting" pain in her left foot and both legs. The pain is mostly mild, but then gets very severe every 6 months. Taking Gabapentin 300mg  qd, which does not help. No sleepiness after taking the Gabapentin.  HTN: Not checking blood pressures at home, but gets these checked at HD and states they have been "fine". Taking Norvasc and Cozaar. No side effects. No chest pain, no shortness of breath, no edema in the LLE.  HLD: Taking Lipitor 20mg  daily. States she has never been tried at an increased dose. No muscle pain or RUQ pain. Cannot remember the last time her cholesterol was checked.  ROS: See HPI for pertinent positives and negatives  Past Medical History- HTN, T2DM, ESRD on HD, s/p R BKA, hx alcohol abuse, legally blind  Family history reviewed for today's visit. No changes.  Social history- patient is a never smoker. Hx of alcohol abuse.  Objective: BP 132/60   Pulse 95   Temp 98 F (36.7 C)   Ht 5\' 7"  (1.702 m)   Wt 203 lb (92.1 kg)   LMP 03/13/2017 (Exact Date)   SpO2 98%   BMI 31.79 kg/m  Gen: NAD, alert, cooperative with exam HEENT: NCAT, EOMI, MMM Neck: FROM, supple, no JVD CV: RRR, no murmur Resp: CTABL, no wheezes, normal work of breathing Msk: R BKA, no edema of LLE Diabetic Foot: Only completed on L foot due to R BKA. No deformities, no ulcerations, no skin breakdown; PT and DP pulses intact; decreased sensation to monofilament testing throughout entire foot. Neuro: Alert  and oriented, no gross deficits Skin: No rashes, no lesions Psych: Appropriate behavior  Assessment/Plan: T2DM: Uncontrolled. Last A1c 6.6%. Goal < 7%. Taking NPH/Regular insulin 70/30 10 units bid. - Repeat A1c was 7.4% today - Continue NPH/regular insulin 70/30 10 units bid - Continue checking blood sugars daily - Discussed decreasing carb intake to 1 carb per meal. Patient drinking 2 cans of soda per day. Discussed decreasing this to 1 can daily - Diabetic foot exam performed on left foot today and was notable for decreased sensation throughout - Follows with ophthalmology. Obtained records for most recent diabetic eye exam. Will place in scan file. - Follow-up in 3 months  Peripheral Neuropathy: Worsening. Diabetic foot exam with decreased sensation throughout left foot. - Continue Gabapentin 300mg  qd - Will add Nortriptyline 25mg  qhs. Can titrate up to 50mg  qhs - Follow-up in 3 months.  HTN: Well-controlled. BP 132/60 in clinic today. - Continue Norvasc 10mg  daily, Losartan 100mg  daily, and Lopressor 75mg  bid - Follow-up in 3 months  HLD: Stable. Last lipid panel was in 2013. Taking Lipitor 20mg  daily. - Will increase Lipitor from 20mg  daily to 40mg  daily, given her cardiovascular risk factors - follow-up in 3 months   Hyman Bible, MD PGY-2

## 2017-03-17 NOTE — Assessment & Plan Note (Signed)
Well-controlled. BP 132/60 in clinic today. - Continue Norvasc 10mg  daily, Losartan 100mg  daily, and Lopressor 75mg  bid - Follow-up in 3 months

## 2017-03-17 NOTE — Patient Instructions (Addendum)
It was wonderful to see you!  For your neuropathy, you can continue to take the Gabapentin once a day. I have added another medication called Nortriptyline. Please take this once a day at nighttime.  We will check your cholesterol levels today. I have increased your Lipitor from 20mg  daily to 40mg  daily. Please let me know if you have any side effects with this increased dose.  Please schedule an appointment ASAP to have your pap smear done.  -Dr. Brett Albino

## 2017-03-17 NOTE — Assessment & Plan Note (Signed)
Worsening. Diabetic foot exam with decreased sensation throughout left foot. - Continue Gabapentin 300mg  qd - Will add Nortriptyline 25mg  qhs. Can titrate up to 50mg  qhs - Follow-up in 3 months.

## 2017-03-17 NOTE — Assessment & Plan Note (Signed)
Stable. Last lipid panel was in 2013. Taking Lipitor 20mg  daily. - Will increase Lipitor from 20mg  daily to 40mg  daily, given her cardiovascular risk factors - follow-up in 3 months

## 2017-03-17 NOTE — Assessment & Plan Note (Signed)
Uncontrolled. Last A1c 6.6%. Goal < 7%. Taking NPH/Regular insulin 70/30 10 units bid. - Repeat A1c was 7.4% today - Continue NPH/regular insulin 70/30 10 units bid - Continue checking blood sugars daily - Discussed decreasing carb intake to 1 carb per meal. Patient drinking 2 cans of soda per day. Discussed decreasing this to 1 can daily - Diabetic foot exam performed on left foot today and was notable for decreased sensation throughout - Follows with ophthalmology. Obtained records for most recent diabetic eye exam. Will place in scan file. - Follow-up in 3 months

## 2017-03-18 DIAGNOSIS — N2581 Secondary hyperparathyroidism of renal origin: Secondary | ICD-10-CM | POA: Diagnosis not present

## 2017-03-18 DIAGNOSIS — D509 Iron deficiency anemia, unspecified: Secondary | ICD-10-CM | POA: Diagnosis not present

## 2017-03-18 DIAGNOSIS — D631 Anemia in chronic kidney disease: Secondary | ICD-10-CM | POA: Diagnosis not present

## 2017-03-18 DIAGNOSIS — E1122 Type 2 diabetes mellitus with diabetic chronic kidney disease: Secondary | ICD-10-CM | POA: Diagnosis not present

## 2017-03-18 DIAGNOSIS — N186 End stage renal disease: Secondary | ICD-10-CM | POA: Diagnosis not present

## 2017-03-18 LAB — LIPID PANEL
Chol/HDL Ratio: 2.4 ratio (ref 0.0–4.4)
Cholesterol, Total: 129 mg/dL (ref 100–199)
HDL: 54 mg/dL (ref >39)
LDL Calculated: 41 mg/dL (ref 0–99)
Triglycerides: 172 mg/dL — ABNORMAL HIGH (ref 0–149)
VLDL Cholesterol Cal: 34 mg/dL (ref 5–40)

## 2017-03-21 DIAGNOSIS — N2581 Secondary hyperparathyroidism of renal origin: Secondary | ICD-10-CM | POA: Diagnosis not present

## 2017-03-21 DIAGNOSIS — D631 Anemia in chronic kidney disease: Secondary | ICD-10-CM | POA: Diagnosis not present

## 2017-03-21 DIAGNOSIS — E1122 Type 2 diabetes mellitus with diabetic chronic kidney disease: Secondary | ICD-10-CM | POA: Diagnosis not present

## 2017-03-21 DIAGNOSIS — D509 Iron deficiency anemia, unspecified: Secondary | ICD-10-CM | POA: Diagnosis not present

## 2017-03-21 DIAGNOSIS — N186 End stage renal disease: Secondary | ICD-10-CM | POA: Diagnosis not present

## 2017-03-23 DIAGNOSIS — N2581 Secondary hyperparathyroidism of renal origin: Secondary | ICD-10-CM | POA: Diagnosis not present

## 2017-03-23 DIAGNOSIS — D509 Iron deficiency anemia, unspecified: Secondary | ICD-10-CM | POA: Diagnosis not present

## 2017-03-23 DIAGNOSIS — D631 Anemia in chronic kidney disease: Secondary | ICD-10-CM | POA: Diagnosis not present

## 2017-03-23 DIAGNOSIS — E1122 Type 2 diabetes mellitus with diabetic chronic kidney disease: Secondary | ICD-10-CM | POA: Diagnosis not present

## 2017-03-23 DIAGNOSIS — N186 End stage renal disease: Secondary | ICD-10-CM | POA: Diagnosis not present

## 2017-03-25 DIAGNOSIS — N186 End stage renal disease: Secondary | ICD-10-CM | POA: Diagnosis not present

## 2017-03-25 DIAGNOSIS — E1122 Type 2 diabetes mellitus with diabetic chronic kidney disease: Secondary | ICD-10-CM | POA: Diagnosis not present

## 2017-03-25 DIAGNOSIS — D631 Anemia in chronic kidney disease: Secondary | ICD-10-CM | POA: Diagnosis not present

## 2017-03-25 DIAGNOSIS — D509 Iron deficiency anemia, unspecified: Secondary | ICD-10-CM | POA: Diagnosis not present

## 2017-03-25 DIAGNOSIS — N2581 Secondary hyperparathyroidism of renal origin: Secondary | ICD-10-CM | POA: Diagnosis not present

## 2017-03-28 DIAGNOSIS — D631 Anemia in chronic kidney disease: Secondary | ICD-10-CM | POA: Diagnosis not present

## 2017-03-28 DIAGNOSIS — D509 Iron deficiency anemia, unspecified: Secondary | ICD-10-CM | POA: Diagnosis not present

## 2017-03-28 DIAGNOSIS — E1122 Type 2 diabetes mellitus with diabetic chronic kidney disease: Secondary | ICD-10-CM | POA: Diagnosis not present

## 2017-03-28 DIAGNOSIS — N2581 Secondary hyperparathyroidism of renal origin: Secondary | ICD-10-CM | POA: Diagnosis not present

## 2017-03-28 DIAGNOSIS — N186 End stage renal disease: Secondary | ICD-10-CM | POA: Diagnosis not present

## 2017-03-30 DIAGNOSIS — N2581 Secondary hyperparathyroidism of renal origin: Secondary | ICD-10-CM | POA: Diagnosis not present

## 2017-03-30 DIAGNOSIS — D509 Iron deficiency anemia, unspecified: Secondary | ICD-10-CM | POA: Diagnosis not present

## 2017-03-30 DIAGNOSIS — N186 End stage renal disease: Secondary | ICD-10-CM | POA: Diagnosis not present

## 2017-03-30 DIAGNOSIS — D631 Anemia in chronic kidney disease: Secondary | ICD-10-CM | POA: Diagnosis not present

## 2017-03-30 DIAGNOSIS — E1122 Type 2 diabetes mellitus with diabetic chronic kidney disease: Secondary | ICD-10-CM | POA: Diagnosis not present

## 2017-04-01 DIAGNOSIS — D509 Iron deficiency anemia, unspecified: Secondary | ICD-10-CM | POA: Diagnosis not present

## 2017-04-01 DIAGNOSIS — D631 Anemia in chronic kidney disease: Secondary | ICD-10-CM | POA: Diagnosis not present

## 2017-04-01 DIAGNOSIS — N186 End stage renal disease: Secondary | ICD-10-CM | POA: Diagnosis not present

## 2017-04-01 DIAGNOSIS — E1122 Type 2 diabetes mellitus with diabetic chronic kidney disease: Secondary | ICD-10-CM | POA: Diagnosis not present

## 2017-04-01 DIAGNOSIS — N2581 Secondary hyperparathyroidism of renal origin: Secondary | ICD-10-CM | POA: Diagnosis not present

## 2017-04-04 DIAGNOSIS — N2581 Secondary hyperparathyroidism of renal origin: Secondary | ICD-10-CM | POA: Diagnosis not present

## 2017-04-04 DIAGNOSIS — E1122 Type 2 diabetes mellitus with diabetic chronic kidney disease: Secondary | ICD-10-CM | POA: Diagnosis not present

## 2017-04-04 DIAGNOSIS — Z992 Dependence on renal dialysis: Secondary | ICD-10-CM | POA: Diagnosis not present

## 2017-04-04 DIAGNOSIS — N186 End stage renal disease: Secondary | ICD-10-CM | POA: Diagnosis not present

## 2017-04-04 DIAGNOSIS — D631 Anemia in chronic kidney disease: Secondary | ICD-10-CM | POA: Diagnosis not present

## 2017-04-04 DIAGNOSIS — D509 Iron deficiency anemia, unspecified: Secondary | ICD-10-CM | POA: Diagnosis not present

## 2017-04-06 DIAGNOSIS — D631 Anemia in chronic kidney disease: Secondary | ICD-10-CM | POA: Diagnosis not present

## 2017-04-06 DIAGNOSIS — D509 Iron deficiency anemia, unspecified: Secondary | ICD-10-CM | POA: Diagnosis not present

## 2017-04-06 DIAGNOSIS — N2581 Secondary hyperparathyroidism of renal origin: Secondary | ICD-10-CM | POA: Diagnosis not present

## 2017-04-06 DIAGNOSIS — E1122 Type 2 diabetes mellitus with diabetic chronic kidney disease: Secondary | ICD-10-CM | POA: Diagnosis not present

## 2017-04-06 DIAGNOSIS — N186 End stage renal disease: Secondary | ICD-10-CM | POA: Diagnosis not present

## 2017-04-07 DIAGNOSIS — E113593 Type 2 diabetes mellitus with proliferative diabetic retinopathy without macular edema, bilateral: Secondary | ICD-10-CM | POA: Diagnosis not present

## 2017-04-07 DIAGNOSIS — H35371 Puckering of macula, right eye: Secondary | ICD-10-CM | POA: Diagnosis not present

## 2017-04-08 DIAGNOSIS — N2581 Secondary hyperparathyroidism of renal origin: Secondary | ICD-10-CM | POA: Diagnosis not present

## 2017-04-08 DIAGNOSIS — E1122 Type 2 diabetes mellitus with diabetic chronic kidney disease: Secondary | ICD-10-CM | POA: Diagnosis not present

## 2017-04-08 DIAGNOSIS — D631 Anemia in chronic kidney disease: Secondary | ICD-10-CM | POA: Diagnosis not present

## 2017-04-08 DIAGNOSIS — N186 End stage renal disease: Secondary | ICD-10-CM | POA: Diagnosis not present

## 2017-04-08 DIAGNOSIS — D509 Iron deficiency anemia, unspecified: Secondary | ICD-10-CM | POA: Diagnosis not present

## 2017-04-11 DIAGNOSIS — D631 Anemia in chronic kidney disease: Secondary | ICD-10-CM | POA: Diagnosis not present

## 2017-04-11 DIAGNOSIS — N2581 Secondary hyperparathyroidism of renal origin: Secondary | ICD-10-CM | POA: Diagnosis not present

## 2017-04-11 DIAGNOSIS — E1122 Type 2 diabetes mellitus with diabetic chronic kidney disease: Secondary | ICD-10-CM | POA: Diagnosis not present

## 2017-04-11 DIAGNOSIS — N186 End stage renal disease: Secondary | ICD-10-CM | POA: Diagnosis not present

## 2017-04-11 DIAGNOSIS — D509 Iron deficiency anemia, unspecified: Secondary | ICD-10-CM | POA: Diagnosis not present

## 2017-04-13 DIAGNOSIS — E1122 Type 2 diabetes mellitus with diabetic chronic kidney disease: Secondary | ICD-10-CM | POA: Diagnosis not present

## 2017-04-13 DIAGNOSIS — D509 Iron deficiency anemia, unspecified: Secondary | ICD-10-CM | POA: Diagnosis not present

## 2017-04-13 DIAGNOSIS — D631 Anemia in chronic kidney disease: Secondary | ICD-10-CM | POA: Diagnosis not present

## 2017-04-13 DIAGNOSIS — N186 End stage renal disease: Secondary | ICD-10-CM | POA: Diagnosis not present

## 2017-04-13 DIAGNOSIS — N2581 Secondary hyperparathyroidism of renal origin: Secondary | ICD-10-CM | POA: Diagnosis not present

## 2017-04-15 DIAGNOSIS — D509 Iron deficiency anemia, unspecified: Secondary | ICD-10-CM | POA: Diagnosis not present

## 2017-04-15 DIAGNOSIS — N186 End stage renal disease: Secondary | ICD-10-CM | POA: Diagnosis not present

## 2017-04-15 DIAGNOSIS — N2581 Secondary hyperparathyroidism of renal origin: Secondary | ICD-10-CM | POA: Diagnosis not present

## 2017-04-15 DIAGNOSIS — D631 Anemia in chronic kidney disease: Secondary | ICD-10-CM | POA: Diagnosis not present

## 2017-04-15 DIAGNOSIS — E1122 Type 2 diabetes mellitus with diabetic chronic kidney disease: Secondary | ICD-10-CM | POA: Diagnosis not present

## 2017-04-18 DIAGNOSIS — N186 End stage renal disease: Secondary | ICD-10-CM | POA: Diagnosis not present

## 2017-04-18 DIAGNOSIS — N2581 Secondary hyperparathyroidism of renal origin: Secondary | ICD-10-CM | POA: Diagnosis not present

## 2017-04-18 DIAGNOSIS — D631 Anemia in chronic kidney disease: Secondary | ICD-10-CM | POA: Diagnosis not present

## 2017-04-18 DIAGNOSIS — D509 Iron deficiency anemia, unspecified: Secondary | ICD-10-CM | POA: Diagnosis not present

## 2017-04-18 DIAGNOSIS — E1122 Type 2 diabetes mellitus with diabetic chronic kidney disease: Secondary | ICD-10-CM | POA: Diagnosis not present

## 2017-04-20 DIAGNOSIS — N186 End stage renal disease: Secondary | ICD-10-CM | POA: Diagnosis not present

## 2017-04-20 DIAGNOSIS — E1122 Type 2 diabetes mellitus with diabetic chronic kidney disease: Secondary | ICD-10-CM | POA: Diagnosis not present

## 2017-04-20 DIAGNOSIS — D631 Anemia in chronic kidney disease: Secondary | ICD-10-CM | POA: Diagnosis not present

## 2017-04-20 DIAGNOSIS — D509 Iron deficiency anemia, unspecified: Secondary | ICD-10-CM | POA: Diagnosis not present

## 2017-04-20 DIAGNOSIS — N2581 Secondary hyperparathyroidism of renal origin: Secondary | ICD-10-CM | POA: Diagnosis not present

## 2017-04-21 ENCOUNTER — Ambulatory Visit: Payer: Medicare Other | Admitting: Internal Medicine

## 2017-04-22 DIAGNOSIS — D509 Iron deficiency anemia, unspecified: Secondary | ICD-10-CM | POA: Diagnosis not present

## 2017-04-22 DIAGNOSIS — E1122 Type 2 diabetes mellitus with diabetic chronic kidney disease: Secondary | ICD-10-CM | POA: Diagnosis not present

## 2017-04-22 DIAGNOSIS — D631 Anemia in chronic kidney disease: Secondary | ICD-10-CM | POA: Diagnosis not present

## 2017-04-22 DIAGNOSIS — N2581 Secondary hyperparathyroidism of renal origin: Secondary | ICD-10-CM | POA: Diagnosis not present

## 2017-04-22 DIAGNOSIS — N186 End stage renal disease: Secondary | ICD-10-CM | POA: Diagnosis not present

## 2017-04-25 DIAGNOSIS — D509 Iron deficiency anemia, unspecified: Secondary | ICD-10-CM | POA: Diagnosis not present

## 2017-04-25 DIAGNOSIS — N186 End stage renal disease: Secondary | ICD-10-CM | POA: Diagnosis not present

## 2017-04-25 DIAGNOSIS — N2581 Secondary hyperparathyroidism of renal origin: Secondary | ICD-10-CM | POA: Diagnosis not present

## 2017-04-25 DIAGNOSIS — D631 Anemia in chronic kidney disease: Secondary | ICD-10-CM | POA: Diagnosis not present

## 2017-04-25 DIAGNOSIS — E1122 Type 2 diabetes mellitus with diabetic chronic kidney disease: Secondary | ICD-10-CM | POA: Diagnosis not present

## 2017-04-27 DIAGNOSIS — N186 End stage renal disease: Secondary | ICD-10-CM | POA: Diagnosis not present

## 2017-04-27 DIAGNOSIS — N2581 Secondary hyperparathyroidism of renal origin: Secondary | ICD-10-CM | POA: Diagnosis not present

## 2017-04-27 DIAGNOSIS — D631 Anemia in chronic kidney disease: Secondary | ICD-10-CM | POA: Diagnosis not present

## 2017-04-27 DIAGNOSIS — E1122 Type 2 diabetes mellitus with diabetic chronic kidney disease: Secondary | ICD-10-CM | POA: Diagnosis not present

## 2017-04-27 DIAGNOSIS — D509 Iron deficiency anemia, unspecified: Secondary | ICD-10-CM | POA: Diagnosis not present

## 2017-04-29 DIAGNOSIS — E1122 Type 2 diabetes mellitus with diabetic chronic kidney disease: Secondary | ICD-10-CM | POA: Diagnosis not present

## 2017-04-29 DIAGNOSIS — D631 Anemia in chronic kidney disease: Secondary | ICD-10-CM | POA: Diagnosis not present

## 2017-04-29 DIAGNOSIS — D509 Iron deficiency anemia, unspecified: Secondary | ICD-10-CM | POA: Diagnosis not present

## 2017-04-29 DIAGNOSIS — N2581 Secondary hyperparathyroidism of renal origin: Secondary | ICD-10-CM | POA: Diagnosis not present

## 2017-04-29 DIAGNOSIS — N186 End stage renal disease: Secondary | ICD-10-CM | POA: Diagnosis not present

## 2017-05-02 DIAGNOSIS — N2581 Secondary hyperparathyroidism of renal origin: Secondary | ICD-10-CM | POA: Diagnosis not present

## 2017-05-02 DIAGNOSIS — E1122 Type 2 diabetes mellitus with diabetic chronic kidney disease: Secondary | ICD-10-CM | POA: Diagnosis not present

## 2017-05-02 DIAGNOSIS — N186 End stage renal disease: Secondary | ICD-10-CM | POA: Diagnosis not present

## 2017-05-02 DIAGNOSIS — D631 Anemia in chronic kidney disease: Secondary | ICD-10-CM | POA: Diagnosis not present

## 2017-05-02 DIAGNOSIS — D509 Iron deficiency anemia, unspecified: Secondary | ICD-10-CM | POA: Diagnosis not present

## 2017-05-04 ENCOUNTER — Ambulatory Visit: Payer: Medicare Other | Admitting: Internal Medicine

## 2017-05-04 DIAGNOSIS — N2581 Secondary hyperparathyroidism of renal origin: Secondary | ICD-10-CM | POA: Diagnosis not present

## 2017-05-04 DIAGNOSIS — N186 End stage renal disease: Secondary | ICD-10-CM | POA: Diagnosis not present

## 2017-05-04 DIAGNOSIS — D509 Iron deficiency anemia, unspecified: Secondary | ICD-10-CM | POA: Diagnosis not present

## 2017-05-04 DIAGNOSIS — D631 Anemia in chronic kidney disease: Secondary | ICD-10-CM | POA: Diagnosis not present

## 2017-05-04 DIAGNOSIS — E1122 Type 2 diabetes mellitus with diabetic chronic kidney disease: Secondary | ICD-10-CM | POA: Diagnosis not present

## 2017-05-05 ENCOUNTER — Ambulatory Visit: Payer: Medicare Other | Admitting: Student

## 2017-05-05 DIAGNOSIS — Z992 Dependence on renal dialysis: Secondary | ICD-10-CM | POA: Diagnosis not present

## 2017-05-05 DIAGNOSIS — N186 End stage renal disease: Secondary | ICD-10-CM | POA: Diagnosis not present

## 2017-05-05 DIAGNOSIS — E1122 Type 2 diabetes mellitus with diabetic chronic kidney disease: Secondary | ICD-10-CM | POA: Diagnosis not present

## 2017-05-06 DIAGNOSIS — D509 Iron deficiency anemia, unspecified: Secondary | ICD-10-CM | POA: Diagnosis not present

## 2017-05-06 DIAGNOSIS — D631 Anemia in chronic kidney disease: Secondary | ICD-10-CM | POA: Diagnosis not present

## 2017-05-06 DIAGNOSIS — N186 End stage renal disease: Secondary | ICD-10-CM | POA: Diagnosis not present

## 2017-05-06 DIAGNOSIS — N2581 Secondary hyperparathyroidism of renal origin: Secondary | ICD-10-CM | POA: Diagnosis not present

## 2017-05-06 DIAGNOSIS — E1122 Type 2 diabetes mellitus with diabetic chronic kidney disease: Secondary | ICD-10-CM | POA: Diagnosis not present

## 2017-05-09 DIAGNOSIS — N186 End stage renal disease: Secondary | ICD-10-CM | POA: Diagnosis not present

## 2017-05-09 DIAGNOSIS — D631 Anemia in chronic kidney disease: Secondary | ICD-10-CM | POA: Diagnosis not present

## 2017-05-09 DIAGNOSIS — D509 Iron deficiency anemia, unspecified: Secondary | ICD-10-CM | POA: Diagnosis not present

## 2017-05-09 DIAGNOSIS — E1122 Type 2 diabetes mellitus with diabetic chronic kidney disease: Secondary | ICD-10-CM | POA: Diagnosis not present

## 2017-05-09 DIAGNOSIS — N2581 Secondary hyperparathyroidism of renal origin: Secondary | ICD-10-CM | POA: Diagnosis not present

## 2017-05-10 ENCOUNTER — Ambulatory Visit: Payer: Medicare Other | Admitting: Student

## 2017-05-11 ENCOUNTER — Other Ambulatory Visit: Payer: Self-pay | Admitting: *Deleted

## 2017-05-11 DIAGNOSIS — N186 End stage renal disease: Secondary | ICD-10-CM | POA: Diagnosis not present

## 2017-05-11 DIAGNOSIS — E1122 Type 2 diabetes mellitus with diabetic chronic kidney disease: Secondary | ICD-10-CM | POA: Diagnosis not present

## 2017-05-11 DIAGNOSIS — N2581 Secondary hyperparathyroidism of renal origin: Secondary | ICD-10-CM | POA: Diagnosis not present

## 2017-05-11 DIAGNOSIS — D509 Iron deficiency anemia, unspecified: Secondary | ICD-10-CM | POA: Diagnosis not present

## 2017-05-11 DIAGNOSIS — D631 Anemia in chronic kidney disease: Secondary | ICD-10-CM | POA: Diagnosis not present

## 2017-05-11 MED ORDER — AMLODIPINE BESYLATE 10 MG PO TABS: 10.0000 mg | ORAL_TABLET | Freq: Every day | ORAL | 0 refills | Status: DC

## 2017-05-12 ENCOUNTER — Other Ambulatory Visit: Payer: Self-pay | Admitting: Internal Medicine

## 2017-05-13 DIAGNOSIS — N186 End stage renal disease: Secondary | ICD-10-CM | POA: Diagnosis not present

## 2017-05-13 DIAGNOSIS — D631 Anemia in chronic kidney disease: Secondary | ICD-10-CM | POA: Diagnosis not present

## 2017-05-13 DIAGNOSIS — D509 Iron deficiency anemia, unspecified: Secondary | ICD-10-CM | POA: Diagnosis not present

## 2017-05-13 DIAGNOSIS — E1122 Type 2 diabetes mellitus with diabetic chronic kidney disease: Secondary | ICD-10-CM | POA: Diagnosis not present

## 2017-05-13 DIAGNOSIS — N2581 Secondary hyperparathyroidism of renal origin: Secondary | ICD-10-CM | POA: Diagnosis not present

## 2017-05-16 DIAGNOSIS — E1122 Type 2 diabetes mellitus with diabetic chronic kidney disease: Secondary | ICD-10-CM | POA: Diagnosis not present

## 2017-05-16 DIAGNOSIS — D631 Anemia in chronic kidney disease: Secondary | ICD-10-CM | POA: Diagnosis not present

## 2017-05-16 DIAGNOSIS — D509 Iron deficiency anemia, unspecified: Secondary | ICD-10-CM | POA: Diagnosis not present

## 2017-05-16 DIAGNOSIS — N186 End stage renal disease: Secondary | ICD-10-CM | POA: Diagnosis not present

## 2017-05-16 DIAGNOSIS — N2581 Secondary hyperparathyroidism of renal origin: Secondary | ICD-10-CM | POA: Diagnosis not present

## 2017-05-17 ENCOUNTER — Ambulatory Visit: Payer: Medicare Other | Admitting: Student

## 2017-05-18 DIAGNOSIS — N186 End stage renal disease: Secondary | ICD-10-CM | POA: Diagnosis not present

## 2017-05-18 DIAGNOSIS — D509 Iron deficiency anemia, unspecified: Secondary | ICD-10-CM | POA: Diagnosis not present

## 2017-05-18 DIAGNOSIS — E1122 Type 2 diabetes mellitus with diabetic chronic kidney disease: Secondary | ICD-10-CM | POA: Diagnosis not present

## 2017-05-18 DIAGNOSIS — N2581 Secondary hyperparathyroidism of renal origin: Secondary | ICD-10-CM | POA: Diagnosis not present

## 2017-05-18 DIAGNOSIS — D631 Anemia in chronic kidney disease: Secondary | ICD-10-CM | POA: Diagnosis not present

## 2017-05-20 DIAGNOSIS — N2581 Secondary hyperparathyroidism of renal origin: Secondary | ICD-10-CM | POA: Diagnosis not present

## 2017-05-20 DIAGNOSIS — D509 Iron deficiency anemia, unspecified: Secondary | ICD-10-CM | POA: Diagnosis not present

## 2017-05-20 DIAGNOSIS — E1122 Type 2 diabetes mellitus with diabetic chronic kidney disease: Secondary | ICD-10-CM | POA: Diagnosis not present

## 2017-05-20 DIAGNOSIS — D631 Anemia in chronic kidney disease: Secondary | ICD-10-CM | POA: Diagnosis not present

## 2017-05-20 DIAGNOSIS — N186 End stage renal disease: Secondary | ICD-10-CM | POA: Diagnosis not present

## 2017-05-23 DIAGNOSIS — N186 End stage renal disease: Secondary | ICD-10-CM | POA: Diagnosis not present

## 2017-05-23 DIAGNOSIS — N2581 Secondary hyperparathyroidism of renal origin: Secondary | ICD-10-CM | POA: Diagnosis not present

## 2017-05-23 DIAGNOSIS — D509 Iron deficiency anemia, unspecified: Secondary | ICD-10-CM | POA: Diagnosis not present

## 2017-05-23 DIAGNOSIS — D631 Anemia in chronic kidney disease: Secondary | ICD-10-CM | POA: Diagnosis not present

## 2017-05-23 DIAGNOSIS — E1122 Type 2 diabetes mellitus with diabetic chronic kidney disease: Secondary | ICD-10-CM | POA: Diagnosis not present

## 2017-05-24 ENCOUNTER — Ambulatory Visit (INDEPENDENT_AMBULATORY_CARE_PROVIDER_SITE_OTHER): Payer: Medicare Other | Admitting: Student

## 2017-05-24 ENCOUNTER — Encounter: Payer: Self-pay | Admitting: Student

## 2017-05-24 DIAGNOSIS — E785 Hyperlipidemia, unspecified: Secondary | ICD-10-CM | POA: Diagnosis present

## 2017-05-24 DIAGNOSIS — Z0000 Encounter for general adult medical examination without abnormal findings: Secondary | ICD-10-CM | POA: Diagnosis not present

## 2017-05-24 MED ORDER — ATORVASTATIN CALCIUM 40 MG PO TABS: 40.0000 mg | ORAL_TABLET | Freq: Every day | ORAL | 3 refills | Status: DC

## 2017-05-24 NOTE — Progress Notes (Signed)
   Subjective:    Patient ID: [name], female    DOB: 09-16-1966, 51 y.o.   MRN: [id number]   CC: pap smear, HLD  HPI: 51 y/o F presents for pap smear, HLD  Pap Smear - due for pap but totday but she declines it as she started her cycle yesterday - no abdominal pain, vaginal irritation or discharge  HLD - she lost her atorvastatin and requests a refill today    Smoking status reviewed  Review of Systems  Per HPI, else denies recent illness, fever, chest pain, shortness of breath,     Objective:  BP 130/65   Pulse 100   Temp 98.5 F (36.9 C) (Oral)   Wt 204 lb (92.5 kg)   LMP 05/23/2017   SpO2 99%   BMI 31.95 kg/m  Vitals and nursing note reviewed  General: NAD Cardiac: RRR,  Respiratory: CTAB, normal effort Skin: warm and dry, no rashes noted Extremities: status post right BKA Neuro: alert and oriented, no focal deficits     Assessment & Plan:    Hyperlipidemia Atorva reordered  Healthcare maintenance Pap at next visit    Alyssa A. Lincoln Brigham MD, [facility] Resident PGY-3 Pager [phone]

## 2017-05-24 NOTE — Assessment & Plan Note (Signed)
Pap at next visit

## 2017-05-24 NOTE — Patient Instructions (Addendum)
Follow up in 2 weeks for pap smear Call the office with any questions or concerns

## 2017-05-24 NOTE — Assessment & Plan Note (Signed)
Atorva reordered

## 2017-05-25 DIAGNOSIS — D509 Iron deficiency anemia, unspecified: Secondary | ICD-10-CM | POA: Diagnosis not present

## 2017-05-25 DIAGNOSIS — E1122 Type 2 diabetes mellitus with diabetic chronic kidney disease: Secondary | ICD-10-CM | POA: Diagnosis not present

## 2017-05-25 DIAGNOSIS — N186 End stage renal disease: Secondary | ICD-10-CM | POA: Diagnosis not present

## 2017-05-25 DIAGNOSIS — N2581 Secondary hyperparathyroidism of renal origin: Secondary | ICD-10-CM | POA: Diagnosis not present

## 2017-05-25 DIAGNOSIS — D631 Anemia in chronic kidney disease: Secondary | ICD-10-CM | POA: Diagnosis not present

## 2017-05-27 DIAGNOSIS — N2581 Secondary hyperparathyroidism of renal origin: Secondary | ICD-10-CM | POA: Diagnosis not present

## 2017-05-27 DIAGNOSIS — D631 Anemia in chronic kidney disease: Secondary | ICD-10-CM | POA: Diagnosis not present

## 2017-05-27 DIAGNOSIS — E1122 Type 2 diabetes mellitus with diabetic chronic kidney disease: Secondary | ICD-10-CM | POA: Diagnosis not present

## 2017-05-27 DIAGNOSIS — D509 Iron deficiency anemia, unspecified: Secondary | ICD-10-CM | POA: Diagnosis not present

## 2017-05-27 DIAGNOSIS — N186 End stage renal disease: Secondary | ICD-10-CM | POA: Diagnosis not present

## 2017-05-30 DIAGNOSIS — N2581 Secondary hyperparathyroidism of renal origin: Secondary | ICD-10-CM | POA: Diagnosis not present

## 2017-05-30 DIAGNOSIS — E1122 Type 2 diabetes mellitus with diabetic chronic kidney disease: Secondary | ICD-10-CM | POA: Diagnosis not present

## 2017-05-30 DIAGNOSIS — D509 Iron deficiency anemia, unspecified: Secondary | ICD-10-CM | POA: Diagnosis not present

## 2017-05-30 DIAGNOSIS — N186 End stage renal disease: Secondary | ICD-10-CM | POA: Diagnosis not present

## 2017-05-30 DIAGNOSIS — D631 Anemia in chronic kidney disease: Secondary | ICD-10-CM | POA: Diagnosis not present

## 2017-06-01 DIAGNOSIS — N2581 Secondary hyperparathyroidism of renal origin: Secondary | ICD-10-CM | POA: Diagnosis not present

## 2017-06-01 DIAGNOSIS — N186 End stage renal disease: Secondary | ICD-10-CM | POA: Diagnosis not present

## 2017-06-01 DIAGNOSIS — D631 Anemia in chronic kidney disease: Secondary | ICD-10-CM | POA: Diagnosis not present

## 2017-06-01 DIAGNOSIS — D509 Iron deficiency anemia, unspecified: Secondary | ICD-10-CM | POA: Diagnosis not present

## 2017-06-01 DIAGNOSIS — E1122 Type 2 diabetes mellitus with diabetic chronic kidney disease: Secondary | ICD-10-CM | POA: Diagnosis not present

## 2017-06-03 DIAGNOSIS — D631 Anemia in chronic kidney disease: Secondary | ICD-10-CM | POA: Diagnosis not present

## 2017-06-03 DIAGNOSIS — N2581 Secondary hyperparathyroidism of renal origin: Secondary | ICD-10-CM | POA: Diagnosis not present

## 2017-06-03 DIAGNOSIS — E1122 Type 2 diabetes mellitus with diabetic chronic kidney disease: Secondary | ICD-10-CM | POA: Diagnosis not present

## 2017-06-03 DIAGNOSIS — N186 End stage renal disease: Secondary | ICD-10-CM | POA: Diagnosis not present

## 2017-06-03 DIAGNOSIS — D509 Iron deficiency anemia, unspecified: Secondary | ICD-10-CM | POA: Diagnosis not present

## 2017-06-04 DIAGNOSIS — Z992 Dependence on renal dialysis: Secondary | ICD-10-CM | POA: Diagnosis not present

## 2017-06-04 DIAGNOSIS — E1122 Type 2 diabetes mellitus with diabetic chronic kidney disease: Secondary | ICD-10-CM | POA: Diagnosis not present

## 2017-06-04 DIAGNOSIS — N186 End stage renal disease: Secondary | ICD-10-CM | POA: Diagnosis not present

## 2017-06-06 DIAGNOSIS — N2581 Secondary hyperparathyroidism of renal origin: Secondary | ICD-10-CM | POA: Diagnosis not present

## 2017-06-06 DIAGNOSIS — D509 Iron deficiency anemia, unspecified: Secondary | ICD-10-CM | POA: Diagnosis not present

## 2017-06-06 DIAGNOSIS — E1122 Type 2 diabetes mellitus with diabetic chronic kidney disease: Secondary | ICD-10-CM | POA: Diagnosis not present

## 2017-06-06 DIAGNOSIS — D631 Anemia in chronic kidney disease: Secondary | ICD-10-CM | POA: Diagnosis not present

## 2017-06-06 DIAGNOSIS — N186 End stage renal disease: Secondary | ICD-10-CM | POA: Diagnosis not present

## 2017-06-08 DIAGNOSIS — D631 Anemia in chronic kidney disease: Secondary | ICD-10-CM | POA: Diagnosis not present

## 2017-06-08 DIAGNOSIS — E1122 Type 2 diabetes mellitus with diabetic chronic kidney disease: Secondary | ICD-10-CM | POA: Diagnosis not present

## 2017-06-08 DIAGNOSIS — N2581 Secondary hyperparathyroidism of renal origin: Secondary | ICD-10-CM | POA: Diagnosis not present

## 2017-06-08 DIAGNOSIS — D509 Iron deficiency anemia, unspecified: Secondary | ICD-10-CM | POA: Diagnosis not present

## 2017-06-08 DIAGNOSIS — N186 End stage renal disease: Secondary | ICD-10-CM | POA: Diagnosis not present

## 2017-06-10 ENCOUNTER — Other Ambulatory Visit: Payer: Self-pay | Admitting: Internal Medicine

## 2017-06-10 DIAGNOSIS — N186 End stage renal disease: Secondary | ICD-10-CM | POA: Diagnosis not present

## 2017-06-10 DIAGNOSIS — E1142 Type 2 diabetes mellitus with diabetic polyneuropathy: Secondary | ICD-10-CM

## 2017-06-10 DIAGNOSIS — D509 Iron deficiency anemia, unspecified: Secondary | ICD-10-CM | POA: Diagnosis not present

## 2017-06-10 DIAGNOSIS — N2581 Secondary hyperparathyroidism of renal origin: Secondary | ICD-10-CM | POA: Diagnosis not present

## 2017-06-10 DIAGNOSIS — E1122 Type 2 diabetes mellitus with diabetic chronic kidney disease: Secondary | ICD-10-CM | POA: Diagnosis not present

## 2017-06-10 DIAGNOSIS — D631 Anemia in chronic kidney disease: Secondary | ICD-10-CM | POA: Diagnosis not present

## 2017-06-10 MED ORDER — GABAPENTIN 300 MG PO CAPS: 300.0000 mg | ORAL_CAPSULE | Freq: Every day | ORAL | 0 refills | Status: DC

## 2017-06-13 DIAGNOSIS — D509 Iron deficiency anemia, unspecified: Secondary | ICD-10-CM | POA: Diagnosis not present

## 2017-06-13 DIAGNOSIS — N2581 Secondary hyperparathyroidism of renal origin: Secondary | ICD-10-CM | POA: Diagnosis not present

## 2017-06-13 DIAGNOSIS — E1122 Type 2 diabetes mellitus with diabetic chronic kidney disease: Secondary | ICD-10-CM | POA: Diagnosis not present

## 2017-06-13 DIAGNOSIS — D631 Anemia in chronic kidney disease: Secondary | ICD-10-CM | POA: Diagnosis not present

## 2017-06-13 DIAGNOSIS — N186 End stage renal disease: Secondary | ICD-10-CM | POA: Diagnosis not present

## 2017-06-14 ENCOUNTER — Ambulatory Visit: Payer: Medicare Other | Admitting: Internal Medicine

## 2017-06-15 ENCOUNTER — Other Ambulatory Visit: Payer: Self-pay | Admitting: Internal Medicine

## 2017-06-15 DIAGNOSIS — D509 Iron deficiency anemia, unspecified: Secondary | ICD-10-CM | POA: Diagnosis not present

## 2017-06-15 DIAGNOSIS — N2581 Secondary hyperparathyroidism of renal origin: Secondary | ICD-10-CM | POA: Diagnosis not present

## 2017-06-15 DIAGNOSIS — D631 Anemia in chronic kidney disease: Secondary | ICD-10-CM | POA: Diagnosis not present

## 2017-06-15 DIAGNOSIS — E1122 Type 2 diabetes mellitus with diabetic chronic kidney disease: Secondary | ICD-10-CM | POA: Diagnosis not present

## 2017-06-15 DIAGNOSIS — N186 End stage renal disease: Secondary | ICD-10-CM | POA: Diagnosis not present

## 2017-06-17 DIAGNOSIS — N2581 Secondary hyperparathyroidism of renal origin: Secondary | ICD-10-CM | POA: Diagnosis not present

## 2017-06-17 DIAGNOSIS — D631 Anemia in chronic kidney disease: Secondary | ICD-10-CM | POA: Diagnosis not present

## 2017-06-17 DIAGNOSIS — E1122 Type 2 diabetes mellitus with diabetic chronic kidney disease: Secondary | ICD-10-CM | POA: Diagnosis not present

## 2017-06-17 DIAGNOSIS — D509 Iron deficiency anemia, unspecified: Secondary | ICD-10-CM | POA: Diagnosis not present

## 2017-06-17 DIAGNOSIS — N186 End stage renal disease: Secondary | ICD-10-CM | POA: Diagnosis not present

## 2017-06-20 DIAGNOSIS — N186 End stage renal disease: Secondary | ICD-10-CM | POA: Diagnosis not present

## 2017-06-20 DIAGNOSIS — E1122 Type 2 diabetes mellitus with diabetic chronic kidney disease: Secondary | ICD-10-CM | POA: Diagnosis not present

## 2017-06-20 DIAGNOSIS — N2581 Secondary hyperparathyroidism of renal origin: Secondary | ICD-10-CM | POA: Diagnosis not present

## 2017-06-20 DIAGNOSIS — D509 Iron deficiency anemia, unspecified: Secondary | ICD-10-CM | POA: Diagnosis not present

## 2017-06-20 DIAGNOSIS — D631 Anemia in chronic kidney disease: Secondary | ICD-10-CM | POA: Diagnosis not present

## 2017-06-22 DIAGNOSIS — N186 End stage renal disease: Secondary | ICD-10-CM | POA: Diagnosis not present

## 2017-06-22 DIAGNOSIS — D631 Anemia in chronic kidney disease: Secondary | ICD-10-CM | POA: Diagnosis not present

## 2017-06-22 DIAGNOSIS — D509 Iron deficiency anemia, unspecified: Secondary | ICD-10-CM | POA: Diagnosis not present

## 2017-06-22 DIAGNOSIS — E1122 Type 2 diabetes mellitus with diabetic chronic kidney disease: Secondary | ICD-10-CM | POA: Diagnosis not present

## 2017-06-22 DIAGNOSIS — N2581 Secondary hyperparathyroidism of renal origin: Secondary | ICD-10-CM | POA: Diagnosis not present

## 2017-06-23 ENCOUNTER — Encounter: Payer: Self-pay | Admitting: Internal Medicine

## 2017-06-23 ENCOUNTER — Ambulatory Visit (INDEPENDENT_AMBULATORY_CARE_PROVIDER_SITE_OTHER): Payer: Medicare Other | Admitting: Internal Medicine

## 2017-06-23 VITALS — BP 120/64 | HR 102 | Temp 99.6°F

## 2017-06-23 DIAGNOSIS — E1165 Type 2 diabetes mellitus with hyperglycemia: Secondary | ICD-10-CM

## 2017-06-23 DIAGNOSIS — E1139 Type 2 diabetes mellitus with other diabetic ophthalmic complication: Secondary | ICD-10-CM | POA: Diagnosis not present

## 2017-06-23 DIAGNOSIS — Z124 Encounter for screening for malignant neoplasm of cervix: Secondary | ICD-10-CM | POA: Diagnosis not present

## 2017-06-23 DIAGNOSIS — IMO0002 Reserved for concepts with insufficient information to code with codable children: Secondary | ICD-10-CM

## 2017-06-23 DIAGNOSIS — Z794 Long term (current) use of insulin: Secondary | ICD-10-CM

## 2017-06-23 LAB — POCT GLYCOSYLATED HEMOGLOBIN (HGB A1C): Hemoglobin A1C: 7.7

## 2017-06-23 NOTE — Patient Instructions (Signed)
It was so nice to see you!  Let's keep your insulin the same for now. We will recheck your A1c in 3 months.  Please try to cut back on breads, rice, pasta.  -Dr. Brett Albino

## 2017-06-24 DIAGNOSIS — I871 Compression of vein: Secondary | ICD-10-CM | POA: Diagnosis not present

## 2017-06-24 DIAGNOSIS — N2581 Secondary hyperparathyroidism of renal origin: Secondary | ICD-10-CM | POA: Diagnosis not present

## 2017-06-24 DIAGNOSIS — Z992 Dependence on renal dialysis: Secondary | ICD-10-CM | POA: Diagnosis not present

## 2017-06-24 DIAGNOSIS — D631 Anemia in chronic kidney disease: Secondary | ICD-10-CM | POA: Diagnosis not present

## 2017-06-24 DIAGNOSIS — N186 End stage renal disease: Secondary | ICD-10-CM | POA: Diagnosis not present

## 2017-06-24 DIAGNOSIS — Z124 Encounter for screening for malignant neoplasm of cervix: Secondary | ICD-10-CM | POA: Insufficient documentation

## 2017-06-24 DIAGNOSIS — E1122 Type 2 diabetes mellitus with diabetic chronic kidney disease: Secondary | ICD-10-CM | POA: Diagnosis not present

## 2017-06-24 DIAGNOSIS — D509 Iron deficiency anemia, unspecified: Secondary | ICD-10-CM | POA: Diagnosis not present

## 2017-06-24 NOTE — Assessment & Plan Note (Signed)
Uncontrolled. Last A1c 7.4% on 03/17/17. Goal <7%. - Repeat A1c today was 7.7%. - Will not increase insulin at this time because patient describes having some episodes of feeling like her blood sugar is low - Continue Humulin 70/30 10 units bid - Follow-up in 3 months for repeat A1c

## 2017-06-24 NOTE — Progress Notes (Signed)
   [facility] Phone: [phone]  Subjective:  [name] is a 51 year old female presenting to clinic for pap and to discuss her diabetes.  Pap: Cannot remember the last time she had one. Unsure if she has ever had a normal pap.  T2DM: Checking CBGs a couple times per day. Have been ranging 140-160. Having occasional "wooziness" and "sweatiness" that happens ~1 time per month. Has never checked her blood sugars when this happens. Taking Humulin 70/30 10 units bid.  ROS: See HPI for pertinent positives and negatives  Past Medical History- HTN, T2DM, hx BKA on right, ESRD on HD, HLD, legally blind  Family history reviewed for today's visit. No changes.  Social history- patient is a never smoker  Objective: BP 120/64   Pulse (!) 102   Temp 99.6 F (37.6 C) (Oral)   LMP 06/09/2017   SpO2 95%  Gen: NAD, alert, cooperative with exam CV: RRR, no murmur Resp: CTABL, no wheezes, normal work of breathing GU: External genitalia normal in appearance, no lesions appreciated. Unable to tolerate speculum exam.  Assessment/Plan: Pap: Attempted pap today, but unsuccessful because patient was unable to tolerate a speculum exam. - Will try again at a later date  T2DM: Uncontrolled. Last A1c 7.4% on 03/17/17. Goal <7%. - Repeat A1c today was 7.7%. - Will not increase insulin at this time because patient describes having some episodes of feeling like her blood sugar is low - Continue Humulin 70/30 10 units bid - Follow-up in 3 months for repeat A1c   Hyman Bible, MD PGY-3

## 2017-06-24 NOTE — Assessment & Plan Note (Signed)
Attempted pap today, but unsuccessful because patient was unable to tolerate a speculum exam. - Will try again at a later date

## 2017-06-27 DIAGNOSIS — N186 End stage renal disease: Secondary | ICD-10-CM | POA: Diagnosis not present

## 2017-06-27 DIAGNOSIS — D509 Iron deficiency anemia, unspecified: Secondary | ICD-10-CM | POA: Diagnosis not present

## 2017-06-27 DIAGNOSIS — D631 Anemia in chronic kidney disease: Secondary | ICD-10-CM | POA: Diagnosis not present

## 2017-06-27 DIAGNOSIS — E1122 Type 2 diabetes mellitus with diabetic chronic kidney disease: Secondary | ICD-10-CM | POA: Diagnosis not present

## 2017-06-27 DIAGNOSIS — N2581 Secondary hyperparathyroidism of renal origin: Secondary | ICD-10-CM | POA: Diagnosis not present

## 2017-06-29 DIAGNOSIS — E1122 Type 2 diabetes mellitus with diabetic chronic kidney disease: Secondary | ICD-10-CM | POA: Diagnosis not present

## 2017-06-29 DIAGNOSIS — N2581 Secondary hyperparathyroidism of renal origin: Secondary | ICD-10-CM | POA: Diagnosis not present

## 2017-06-29 DIAGNOSIS — D509 Iron deficiency anemia, unspecified: Secondary | ICD-10-CM | POA: Diagnosis not present

## 2017-06-29 DIAGNOSIS — N186 End stage renal disease: Secondary | ICD-10-CM | POA: Diagnosis not present

## 2017-06-29 DIAGNOSIS — D631 Anemia in chronic kidney disease: Secondary | ICD-10-CM | POA: Diagnosis not present

## 2017-06-30 DIAGNOSIS — H1820 Unspecified corneal edema: Secondary | ICD-10-CM | POA: Diagnosis not present

## 2017-06-30 LAB — HM DIABETES EYE EXAM

## 2017-07-01 DIAGNOSIS — D631 Anemia in chronic kidney disease: Secondary | ICD-10-CM | POA: Diagnosis not present

## 2017-07-01 DIAGNOSIS — E1122 Type 2 diabetes mellitus with diabetic chronic kidney disease: Secondary | ICD-10-CM | POA: Diagnosis not present

## 2017-07-01 DIAGNOSIS — N2581 Secondary hyperparathyroidism of renal origin: Secondary | ICD-10-CM | POA: Diagnosis not present

## 2017-07-01 DIAGNOSIS — N186 End stage renal disease: Secondary | ICD-10-CM | POA: Diagnosis not present

## 2017-07-01 DIAGNOSIS — D509 Iron deficiency anemia, unspecified: Secondary | ICD-10-CM | POA: Diagnosis not present

## 2017-07-04 DIAGNOSIS — N2581 Secondary hyperparathyroidism of renal origin: Secondary | ICD-10-CM | POA: Diagnosis not present

## 2017-07-04 DIAGNOSIS — D631 Anemia in chronic kidney disease: Secondary | ICD-10-CM | POA: Diagnosis not present

## 2017-07-04 DIAGNOSIS — N186 End stage renal disease: Secondary | ICD-10-CM | POA: Diagnosis not present

## 2017-07-04 DIAGNOSIS — D509 Iron deficiency anemia, unspecified: Secondary | ICD-10-CM | POA: Diagnosis not present

## 2017-07-04 DIAGNOSIS — E1122 Type 2 diabetes mellitus with diabetic chronic kidney disease: Secondary | ICD-10-CM | POA: Diagnosis not present

## 2017-07-05 DIAGNOSIS — Z992 Dependence on renal dialysis: Secondary | ICD-10-CM | POA: Diagnosis not present

## 2017-07-05 DIAGNOSIS — E1122 Type 2 diabetes mellitus with diabetic chronic kidney disease: Secondary | ICD-10-CM | POA: Diagnosis not present

## 2017-07-05 DIAGNOSIS — N186 End stage renal disease: Secondary | ICD-10-CM | POA: Diagnosis not present

## 2017-07-06 DIAGNOSIS — Z23 Encounter for immunization: Secondary | ICD-10-CM | POA: Diagnosis not present

## 2017-07-06 DIAGNOSIS — E1122 Type 2 diabetes mellitus with diabetic chronic kidney disease: Secondary | ICD-10-CM | POA: Diagnosis not present

## 2017-07-06 DIAGNOSIS — D509 Iron deficiency anemia, unspecified: Secondary | ICD-10-CM | POA: Diagnosis not present

## 2017-07-06 DIAGNOSIS — N2581 Secondary hyperparathyroidism of renal origin: Secondary | ICD-10-CM | POA: Diagnosis not present

## 2017-07-06 DIAGNOSIS — N186 End stage renal disease: Secondary | ICD-10-CM | POA: Diagnosis not present

## 2017-07-06 DIAGNOSIS — D631 Anemia in chronic kidney disease: Secondary | ICD-10-CM | POA: Diagnosis not present

## 2017-07-08 DIAGNOSIS — D631 Anemia in chronic kidney disease: Secondary | ICD-10-CM | POA: Diagnosis not present

## 2017-07-08 DIAGNOSIS — D509 Iron deficiency anemia, unspecified: Secondary | ICD-10-CM | POA: Diagnosis not present

## 2017-07-08 DIAGNOSIS — N186 End stage renal disease: Secondary | ICD-10-CM | POA: Diagnosis not present

## 2017-07-08 DIAGNOSIS — E1122 Type 2 diabetes mellitus with diabetic chronic kidney disease: Secondary | ICD-10-CM | POA: Diagnosis not present

## 2017-07-08 DIAGNOSIS — N2581 Secondary hyperparathyroidism of renal origin: Secondary | ICD-10-CM | POA: Diagnosis not present

## 2017-07-08 DIAGNOSIS — Z23 Encounter for immunization: Secondary | ICD-10-CM | POA: Diagnosis not present

## 2017-07-11 DIAGNOSIS — N2581 Secondary hyperparathyroidism of renal origin: Secondary | ICD-10-CM | POA: Diagnosis not present

## 2017-07-11 DIAGNOSIS — D509 Iron deficiency anemia, unspecified: Secondary | ICD-10-CM | POA: Diagnosis not present

## 2017-07-11 DIAGNOSIS — D631 Anemia in chronic kidney disease: Secondary | ICD-10-CM | POA: Diagnosis not present

## 2017-07-11 DIAGNOSIS — E1122 Type 2 diabetes mellitus with diabetic chronic kidney disease: Secondary | ICD-10-CM | POA: Diagnosis not present

## 2017-07-11 DIAGNOSIS — N186 End stage renal disease: Secondary | ICD-10-CM | POA: Diagnosis not present

## 2017-07-11 DIAGNOSIS — Z23 Encounter for immunization: Secondary | ICD-10-CM | POA: Diagnosis not present

## 2017-07-13 DIAGNOSIS — Z23 Encounter for immunization: Secondary | ICD-10-CM | POA: Diagnosis not present

## 2017-07-13 DIAGNOSIS — E1122 Type 2 diabetes mellitus with diabetic chronic kidney disease: Secondary | ICD-10-CM | POA: Diagnosis not present

## 2017-07-13 DIAGNOSIS — N2581 Secondary hyperparathyroidism of renal origin: Secondary | ICD-10-CM | POA: Diagnosis not present

## 2017-07-13 DIAGNOSIS — D631 Anemia in chronic kidney disease: Secondary | ICD-10-CM | POA: Diagnosis not present

## 2017-07-13 DIAGNOSIS — D509 Iron deficiency anemia, unspecified: Secondary | ICD-10-CM | POA: Diagnosis not present

## 2017-07-13 DIAGNOSIS — N186 End stage renal disease: Secondary | ICD-10-CM | POA: Diagnosis not present

## 2017-07-15 ENCOUNTER — Other Ambulatory Visit: Payer: Self-pay | Admitting: Internal Medicine

## 2017-07-15 DIAGNOSIS — Z23 Encounter for immunization: Secondary | ICD-10-CM | POA: Diagnosis not present

## 2017-07-15 DIAGNOSIS — E1122 Type 2 diabetes mellitus with diabetic chronic kidney disease: Secondary | ICD-10-CM | POA: Diagnosis not present

## 2017-07-15 DIAGNOSIS — D509 Iron deficiency anemia, unspecified: Secondary | ICD-10-CM | POA: Diagnosis not present

## 2017-07-15 DIAGNOSIS — N2581 Secondary hyperparathyroidism of renal origin: Secondary | ICD-10-CM | POA: Diagnosis not present

## 2017-07-15 DIAGNOSIS — N186 End stage renal disease: Secondary | ICD-10-CM | POA: Diagnosis not present

## 2017-07-15 DIAGNOSIS — D631 Anemia in chronic kidney disease: Secondary | ICD-10-CM | POA: Diagnosis not present

## 2017-07-18 DIAGNOSIS — D631 Anemia in chronic kidney disease: Secondary | ICD-10-CM | POA: Diagnosis not present

## 2017-07-18 DIAGNOSIS — Z23 Encounter for immunization: Secondary | ICD-10-CM | POA: Diagnosis not present

## 2017-07-18 DIAGNOSIS — D509 Iron deficiency anemia, unspecified: Secondary | ICD-10-CM | POA: Diagnosis not present

## 2017-07-18 DIAGNOSIS — E1122 Type 2 diabetes mellitus with diabetic chronic kidney disease: Secondary | ICD-10-CM | POA: Diagnosis not present

## 2017-07-18 DIAGNOSIS — N186 End stage renal disease: Secondary | ICD-10-CM | POA: Diagnosis not present

## 2017-07-18 DIAGNOSIS — N2581 Secondary hyperparathyroidism of renal origin: Secondary | ICD-10-CM | POA: Diagnosis not present

## 2017-07-20 DIAGNOSIS — N2581 Secondary hyperparathyroidism of renal origin: Secondary | ICD-10-CM | POA: Diagnosis not present

## 2017-07-20 DIAGNOSIS — N186 End stage renal disease: Secondary | ICD-10-CM | POA: Diagnosis not present

## 2017-07-20 DIAGNOSIS — D509 Iron deficiency anemia, unspecified: Secondary | ICD-10-CM | POA: Diagnosis not present

## 2017-07-20 DIAGNOSIS — E1122 Type 2 diabetes mellitus with diabetic chronic kidney disease: Secondary | ICD-10-CM | POA: Diagnosis not present

## 2017-07-20 DIAGNOSIS — D631 Anemia in chronic kidney disease: Secondary | ICD-10-CM | POA: Diagnosis not present

## 2017-07-20 DIAGNOSIS — Z23 Encounter for immunization: Secondary | ICD-10-CM | POA: Diagnosis not present

## 2017-07-22 DIAGNOSIS — Z23 Encounter for immunization: Secondary | ICD-10-CM | POA: Diagnosis not present

## 2017-07-22 DIAGNOSIS — E1122 Type 2 diabetes mellitus with diabetic chronic kidney disease: Secondary | ICD-10-CM | POA: Diagnosis not present

## 2017-07-22 DIAGNOSIS — D631 Anemia in chronic kidney disease: Secondary | ICD-10-CM | POA: Diagnosis not present

## 2017-07-22 DIAGNOSIS — N2581 Secondary hyperparathyroidism of renal origin: Secondary | ICD-10-CM | POA: Diagnosis not present

## 2017-07-22 DIAGNOSIS — D509 Iron deficiency anemia, unspecified: Secondary | ICD-10-CM | POA: Diagnosis not present

## 2017-07-22 DIAGNOSIS — N186 End stage renal disease: Secondary | ICD-10-CM | POA: Diagnosis not present

## 2017-07-25 DIAGNOSIS — Z23 Encounter for immunization: Secondary | ICD-10-CM | POA: Diagnosis not present

## 2017-07-25 DIAGNOSIS — N2581 Secondary hyperparathyroidism of renal origin: Secondary | ICD-10-CM | POA: Diagnosis not present

## 2017-07-25 DIAGNOSIS — D631 Anemia in chronic kidney disease: Secondary | ICD-10-CM | POA: Diagnosis not present

## 2017-07-25 DIAGNOSIS — N186 End stage renal disease: Secondary | ICD-10-CM | POA: Diagnosis not present

## 2017-07-25 DIAGNOSIS — E1122 Type 2 diabetes mellitus with diabetic chronic kidney disease: Secondary | ICD-10-CM | POA: Diagnosis not present

## 2017-07-25 DIAGNOSIS — D509 Iron deficiency anemia, unspecified: Secondary | ICD-10-CM | POA: Diagnosis not present

## 2017-07-27 DIAGNOSIS — E1122 Type 2 diabetes mellitus with diabetic chronic kidney disease: Secondary | ICD-10-CM | POA: Diagnosis not present

## 2017-07-27 DIAGNOSIS — D509 Iron deficiency anemia, unspecified: Secondary | ICD-10-CM | POA: Diagnosis not present

## 2017-07-27 DIAGNOSIS — D631 Anemia in chronic kidney disease: Secondary | ICD-10-CM | POA: Diagnosis not present

## 2017-07-27 DIAGNOSIS — N186 End stage renal disease: Secondary | ICD-10-CM | POA: Diagnosis not present

## 2017-07-27 DIAGNOSIS — N2581 Secondary hyperparathyroidism of renal origin: Secondary | ICD-10-CM | POA: Diagnosis not present

## 2017-07-27 DIAGNOSIS — Z23 Encounter for immunization: Secondary | ICD-10-CM | POA: Diagnosis not present

## 2017-07-29 DIAGNOSIS — N2581 Secondary hyperparathyroidism of renal origin: Secondary | ICD-10-CM | POA: Diagnosis not present

## 2017-07-29 DIAGNOSIS — Z23 Encounter for immunization: Secondary | ICD-10-CM | POA: Diagnosis not present

## 2017-07-29 DIAGNOSIS — E1122 Type 2 diabetes mellitus with diabetic chronic kidney disease: Secondary | ICD-10-CM | POA: Diagnosis not present

## 2017-07-29 DIAGNOSIS — D509 Iron deficiency anemia, unspecified: Secondary | ICD-10-CM | POA: Diagnosis not present

## 2017-07-29 DIAGNOSIS — N186 End stage renal disease: Secondary | ICD-10-CM | POA: Diagnosis not present

## 2017-07-29 DIAGNOSIS — D631 Anemia in chronic kidney disease: Secondary | ICD-10-CM | POA: Diagnosis not present

## 2017-08-01 DIAGNOSIS — N2581 Secondary hyperparathyroidism of renal origin: Secondary | ICD-10-CM | POA: Diagnosis not present

## 2017-08-01 DIAGNOSIS — D631 Anemia in chronic kidney disease: Secondary | ICD-10-CM | POA: Diagnosis not present

## 2017-08-01 DIAGNOSIS — E1122 Type 2 diabetes mellitus with diabetic chronic kidney disease: Secondary | ICD-10-CM | POA: Diagnosis not present

## 2017-08-01 DIAGNOSIS — D509 Iron deficiency anemia, unspecified: Secondary | ICD-10-CM | POA: Diagnosis not present

## 2017-08-01 DIAGNOSIS — N186 End stage renal disease: Secondary | ICD-10-CM | POA: Diagnosis not present

## 2017-08-01 DIAGNOSIS — Z23 Encounter for immunization: Secondary | ICD-10-CM | POA: Diagnosis not present

## 2017-08-03 DIAGNOSIS — N186 End stage renal disease: Secondary | ICD-10-CM | POA: Diagnosis not present

## 2017-08-03 DIAGNOSIS — D509 Iron deficiency anemia, unspecified: Secondary | ICD-10-CM | POA: Diagnosis not present

## 2017-08-03 DIAGNOSIS — N2581 Secondary hyperparathyroidism of renal origin: Secondary | ICD-10-CM | POA: Diagnosis not present

## 2017-08-03 DIAGNOSIS — D631 Anemia in chronic kidney disease: Secondary | ICD-10-CM | POA: Diagnosis not present

## 2017-08-03 DIAGNOSIS — Z23 Encounter for immunization: Secondary | ICD-10-CM | POA: Diagnosis not present

## 2017-08-03 DIAGNOSIS — E1122 Type 2 diabetes mellitus with diabetic chronic kidney disease: Secondary | ICD-10-CM | POA: Diagnosis not present

## 2017-08-05 DIAGNOSIS — N2581 Secondary hyperparathyroidism of renal origin: Secondary | ICD-10-CM | POA: Diagnosis not present

## 2017-08-05 DIAGNOSIS — E1122 Type 2 diabetes mellitus with diabetic chronic kidney disease: Secondary | ICD-10-CM | POA: Diagnosis not present

## 2017-08-05 DIAGNOSIS — N186 End stage renal disease: Secondary | ICD-10-CM | POA: Diagnosis not present

## 2017-08-05 DIAGNOSIS — Z23 Encounter for immunization: Secondary | ICD-10-CM | POA: Diagnosis not present

## 2017-08-05 DIAGNOSIS — D631 Anemia in chronic kidney disease: Secondary | ICD-10-CM | POA: Diagnosis not present

## 2017-08-05 DIAGNOSIS — Z992 Dependence on renal dialysis: Secondary | ICD-10-CM | POA: Diagnosis not present

## 2017-08-05 DIAGNOSIS — D509 Iron deficiency anemia, unspecified: Secondary | ICD-10-CM | POA: Diagnosis not present

## 2017-08-08 DIAGNOSIS — D631 Anemia in chronic kidney disease: Secondary | ICD-10-CM | POA: Diagnosis not present

## 2017-08-08 DIAGNOSIS — N186 End stage renal disease: Secondary | ICD-10-CM | POA: Diagnosis not present

## 2017-08-08 DIAGNOSIS — N2581 Secondary hyperparathyroidism of renal origin: Secondary | ICD-10-CM | POA: Diagnosis not present

## 2017-08-08 DIAGNOSIS — D509 Iron deficiency anemia, unspecified: Secondary | ICD-10-CM | POA: Diagnosis not present

## 2017-08-08 DIAGNOSIS — E1122 Type 2 diabetes mellitus with diabetic chronic kidney disease: Secondary | ICD-10-CM | POA: Diagnosis not present

## 2017-08-10 DIAGNOSIS — N186 End stage renal disease: Secondary | ICD-10-CM | POA: Diagnosis not present

## 2017-08-10 DIAGNOSIS — D631 Anemia in chronic kidney disease: Secondary | ICD-10-CM | POA: Diagnosis not present

## 2017-08-10 DIAGNOSIS — D509 Iron deficiency anemia, unspecified: Secondary | ICD-10-CM | POA: Diagnosis not present

## 2017-08-10 DIAGNOSIS — N2581 Secondary hyperparathyroidism of renal origin: Secondary | ICD-10-CM | POA: Diagnosis not present

## 2017-08-10 DIAGNOSIS — E1122 Type 2 diabetes mellitus with diabetic chronic kidney disease: Secondary | ICD-10-CM | POA: Diagnosis not present

## 2017-08-12 DIAGNOSIS — N186 End stage renal disease: Secondary | ICD-10-CM | POA: Diagnosis not present

## 2017-08-12 DIAGNOSIS — E1122 Type 2 diabetes mellitus with diabetic chronic kidney disease: Secondary | ICD-10-CM | POA: Diagnosis not present

## 2017-08-12 DIAGNOSIS — D631 Anemia in chronic kidney disease: Secondary | ICD-10-CM | POA: Diagnosis not present

## 2017-08-12 DIAGNOSIS — N2581 Secondary hyperparathyroidism of renal origin: Secondary | ICD-10-CM | POA: Diagnosis not present

## 2017-08-12 DIAGNOSIS — D509 Iron deficiency anemia, unspecified: Secondary | ICD-10-CM | POA: Diagnosis not present

## 2017-08-15 DIAGNOSIS — D509 Iron deficiency anemia, unspecified: Secondary | ICD-10-CM | POA: Diagnosis not present

## 2017-08-15 DIAGNOSIS — D631 Anemia in chronic kidney disease: Secondary | ICD-10-CM | POA: Diagnosis not present

## 2017-08-15 DIAGNOSIS — E1122 Type 2 diabetes mellitus with diabetic chronic kidney disease: Secondary | ICD-10-CM | POA: Diagnosis not present

## 2017-08-15 DIAGNOSIS — N186 End stage renal disease: Secondary | ICD-10-CM | POA: Diagnosis not present

## 2017-08-15 DIAGNOSIS — N2581 Secondary hyperparathyroidism of renal origin: Secondary | ICD-10-CM | POA: Diagnosis not present

## 2017-08-16 ENCOUNTER — Other Ambulatory Visit: Payer: Self-pay | Admitting: Internal Medicine

## 2017-08-17 DIAGNOSIS — N186 End stage renal disease: Secondary | ICD-10-CM | POA: Diagnosis not present

## 2017-08-17 DIAGNOSIS — N2581 Secondary hyperparathyroidism of renal origin: Secondary | ICD-10-CM | POA: Diagnosis not present

## 2017-08-17 DIAGNOSIS — E1122 Type 2 diabetes mellitus with diabetic chronic kidney disease: Secondary | ICD-10-CM | POA: Diagnosis not present

## 2017-08-17 DIAGNOSIS — D509 Iron deficiency anemia, unspecified: Secondary | ICD-10-CM | POA: Diagnosis not present

## 2017-08-17 DIAGNOSIS — D631 Anemia in chronic kidney disease: Secondary | ICD-10-CM | POA: Diagnosis not present

## 2017-08-19 DIAGNOSIS — D631 Anemia in chronic kidney disease: Secondary | ICD-10-CM | POA: Diagnosis not present

## 2017-08-19 DIAGNOSIS — D509 Iron deficiency anemia, unspecified: Secondary | ICD-10-CM | POA: Diagnosis not present

## 2017-08-19 DIAGNOSIS — N2581 Secondary hyperparathyroidism of renal origin: Secondary | ICD-10-CM | POA: Diagnosis not present

## 2017-08-19 DIAGNOSIS — N186 End stage renal disease: Secondary | ICD-10-CM | POA: Diagnosis not present

## 2017-08-19 DIAGNOSIS — E1122 Type 2 diabetes mellitus with diabetic chronic kidney disease: Secondary | ICD-10-CM | POA: Diagnosis not present

## 2017-08-22 DIAGNOSIS — N186 End stage renal disease: Secondary | ICD-10-CM | POA: Diagnosis not present

## 2017-08-22 DIAGNOSIS — D509 Iron deficiency anemia, unspecified: Secondary | ICD-10-CM | POA: Diagnosis not present

## 2017-08-22 DIAGNOSIS — E1122 Type 2 diabetes mellitus with diabetic chronic kidney disease: Secondary | ICD-10-CM | POA: Diagnosis not present

## 2017-08-22 DIAGNOSIS — D631 Anemia in chronic kidney disease: Secondary | ICD-10-CM | POA: Diagnosis not present

## 2017-08-22 DIAGNOSIS — N2581 Secondary hyperparathyroidism of renal origin: Secondary | ICD-10-CM | POA: Diagnosis not present

## 2017-08-24 DIAGNOSIS — D509 Iron deficiency anemia, unspecified: Secondary | ICD-10-CM | POA: Diagnosis not present

## 2017-08-24 DIAGNOSIS — D631 Anemia in chronic kidney disease: Secondary | ICD-10-CM | POA: Diagnosis not present

## 2017-08-24 DIAGNOSIS — N2581 Secondary hyperparathyroidism of renal origin: Secondary | ICD-10-CM | POA: Diagnosis not present

## 2017-08-24 DIAGNOSIS — E1122 Type 2 diabetes mellitus with diabetic chronic kidney disease: Secondary | ICD-10-CM | POA: Diagnosis not present

## 2017-08-24 DIAGNOSIS — N186 End stage renal disease: Secondary | ICD-10-CM | POA: Diagnosis not present

## 2017-08-26 DIAGNOSIS — N2581 Secondary hyperparathyroidism of renal origin: Secondary | ICD-10-CM | POA: Diagnosis not present

## 2017-08-26 DIAGNOSIS — E1122 Type 2 diabetes mellitus with diabetic chronic kidney disease: Secondary | ICD-10-CM | POA: Diagnosis not present

## 2017-08-26 DIAGNOSIS — D631 Anemia in chronic kidney disease: Secondary | ICD-10-CM | POA: Diagnosis not present

## 2017-08-26 DIAGNOSIS — D509 Iron deficiency anemia, unspecified: Secondary | ICD-10-CM | POA: Diagnosis not present

## 2017-08-26 DIAGNOSIS — N186 End stage renal disease: Secondary | ICD-10-CM | POA: Diagnosis not present

## 2017-08-29 DIAGNOSIS — D631 Anemia in chronic kidney disease: Secondary | ICD-10-CM | POA: Diagnosis not present

## 2017-08-29 DIAGNOSIS — N2581 Secondary hyperparathyroidism of renal origin: Secondary | ICD-10-CM | POA: Diagnosis not present

## 2017-08-29 DIAGNOSIS — D509 Iron deficiency anemia, unspecified: Secondary | ICD-10-CM | POA: Diagnosis not present

## 2017-08-29 DIAGNOSIS — N186 End stage renal disease: Secondary | ICD-10-CM | POA: Diagnosis not present

## 2017-08-29 DIAGNOSIS — E1122 Type 2 diabetes mellitus with diabetic chronic kidney disease: Secondary | ICD-10-CM | POA: Diagnosis not present

## 2017-08-31 DIAGNOSIS — D509 Iron deficiency anemia, unspecified: Secondary | ICD-10-CM | POA: Diagnosis not present

## 2017-08-31 DIAGNOSIS — N186 End stage renal disease: Secondary | ICD-10-CM | POA: Diagnosis not present

## 2017-08-31 DIAGNOSIS — N2581 Secondary hyperparathyroidism of renal origin: Secondary | ICD-10-CM | POA: Diagnosis not present

## 2017-08-31 DIAGNOSIS — E1122 Type 2 diabetes mellitus with diabetic chronic kidney disease: Secondary | ICD-10-CM | POA: Diagnosis not present

## 2017-08-31 DIAGNOSIS — D631 Anemia in chronic kidney disease: Secondary | ICD-10-CM | POA: Diagnosis not present

## 2017-09-02 DIAGNOSIS — N186 End stage renal disease: Secondary | ICD-10-CM | POA: Diagnosis not present

## 2017-09-02 DIAGNOSIS — E1122 Type 2 diabetes mellitus with diabetic chronic kidney disease: Secondary | ICD-10-CM | POA: Diagnosis not present

## 2017-09-02 DIAGNOSIS — D631 Anemia in chronic kidney disease: Secondary | ICD-10-CM | POA: Diagnosis not present

## 2017-09-02 DIAGNOSIS — D509 Iron deficiency anemia, unspecified: Secondary | ICD-10-CM | POA: Diagnosis not present

## 2017-09-02 DIAGNOSIS — N2581 Secondary hyperparathyroidism of renal origin: Secondary | ICD-10-CM | POA: Diagnosis not present

## 2017-09-04 DIAGNOSIS — N186 End stage renal disease: Secondary | ICD-10-CM | POA: Diagnosis not present

## 2017-09-04 DIAGNOSIS — E1122 Type 2 diabetes mellitus with diabetic chronic kidney disease: Secondary | ICD-10-CM | POA: Diagnosis not present

## 2017-09-04 DIAGNOSIS — Z992 Dependence on renal dialysis: Secondary | ICD-10-CM | POA: Diagnosis not present

## 2017-09-05 DIAGNOSIS — D509 Iron deficiency anemia, unspecified: Secondary | ICD-10-CM | POA: Diagnosis not present

## 2017-09-05 DIAGNOSIS — E1122 Type 2 diabetes mellitus with diabetic chronic kidney disease: Secondary | ICD-10-CM | POA: Diagnosis not present

## 2017-09-05 DIAGNOSIS — D631 Anemia in chronic kidney disease: Secondary | ICD-10-CM | POA: Diagnosis not present

## 2017-09-05 DIAGNOSIS — N2581 Secondary hyperparathyroidism of renal origin: Secondary | ICD-10-CM | POA: Diagnosis not present

## 2017-09-05 DIAGNOSIS — N186 End stage renal disease: Secondary | ICD-10-CM | POA: Diagnosis not present

## 2017-09-06 ENCOUNTER — Other Ambulatory Visit: Payer: Self-pay | Admitting: Internal Medicine

## 2017-09-07 DIAGNOSIS — E1122 Type 2 diabetes mellitus with diabetic chronic kidney disease: Secondary | ICD-10-CM | POA: Diagnosis not present

## 2017-09-07 DIAGNOSIS — D631 Anemia in chronic kidney disease: Secondary | ICD-10-CM | POA: Diagnosis not present

## 2017-09-07 DIAGNOSIS — N2581 Secondary hyperparathyroidism of renal origin: Secondary | ICD-10-CM | POA: Diagnosis not present

## 2017-09-07 DIAGNOSIS — D509 Iron deficiency anemia, unspecified: Secondary | ICD-10-CM | POA: Diagnosis not present

## 2017-09-07 DIAGNOSIS — N186 End stage renal disease: Secondary | ICD-10-CM | POA: Diagnosis not present

## 2017-09-08 ENCOUNTER — Encounter: Payer: Self-pay | Admitting: Internal Medicine

## 2017-09-08 ENCOUNTER — Ambulatory Visit (INDEPENDENT_AMBULATORY_CARE_PROVIDER_SITE_OTHER): Payer: Medicare Other | Admitting: Internal Medicine

## 2017-09-08 VITALS — BP 130/70 | HR 105 | Temp 98.8°F

## 2017-09-08 DIAGNOSIS — Z23 Encounter for immunization: Secondary | ICD-10-CM | POA: Diagnosis not present

## 2017-09-08 DIAGNOSIS — Z794 Long term (current) use of insulin: Secondary | ICD-10-CM | POA: Diagnosis not present

## 2017-09-08 DIAGNOSIS — E1139 Type 2 diabetes mellitus with other diabetic ophthalmic complication: Secondary | ICD-10-CM | POA: Diagnosis not present

## 2017-09-08 DIAGNOSIS — E1142 Type 2 diabetes mellitus with diabetic polyneuropathy: Secondary | ICD-10-CM

## 2017-09-08 LAB — POCT GLYCOSYLATED HEMOGLOBIN (HGB A1C): Hemoglobin A1C: 5.9

## 2017-09-08 MED ORDER — GABAPENTIN 100 MG PO CAPS: 100.0000 mg | ORAL_CAPSULE | Freq: Every day | ORAL | 0 refills | Status: DC | PRN

## 2017-09-08 NOTE — Assessment & Plan Note (Signed)
Improved. - Continue Gabapentin 100mg  daily prn and Nortriptyline qhs

## 2017-09-08 NOTE — Patient Instructions (Addendum)
It was so nice to see you!  Your A1c was 5.9%. We will decrease your insulin to 8 units twice a day. Please keep checking your blood sugars.  We will see you back in 3 months.  -Dr. Brett Albino

## 2017-09-08 NOTE — Assessment & Plan Note (Signed)
Last A1c 7.7% in 06/2017. Taking Humulin 70/30 10 units bid. Denies any lows. - A1c was 5.9% in clinic today - Decrease Humulin 70/30 from 10 units bid to 8 units bid. - Continue checking blood sugars daily at home - Follow-up in 3 months.

## 2017-09-08 NOTE — Progress Notes (Signed)
   [facility] Phone: [phone]  Subjective:  [name] is a 51 year old female presenting to clinic for follow-up of diabetes. She has been taking Humulin N 70/30 10 units twice a day. She is checking her blood sugars every day. Blood sugars have been ranging from 130-180. She denies any lows. She denies any shakiness or sweatiness. She has been working on cutting back on the amount of rice she eats. She says she does a good job not eating any sweets. She takes gabapentin once a day and nortriptyline every night for her peripheral neuropathy. She states these medications have been helping a lot. She will only has "nerve twitching" very occasionally.  ROS: See HPI for pertinent positives and negatives  Past Medical History- T2DM, diabetic neuropathy, HTN, s/p right BKA, HLD, ESRD on HD  Family history reviewed for today's visit. No changes.  Social history- patient is a never smoker  Objective: BP 130/70   Pulse (!) 105   Temp 98.8 F (37.1 C) (Oral)   LMP 08/29/2017   SpO2 96%  Gen: NAD, alert, cooperative with exam HEENT: NCAT, EOMI, MMM Neck: FROM, supple CV: RRR, no murmur Resp: CTABL, no wheezes, normal work of breathing Msk: right BKA  Assessment/Plan: T2DM: Last A1c 7.7% in 06/2017. Taking Humulin 70/30 10 units bid. Denies any lows. - A1c was 5.9% in clinic today - Decrease Humulin 70/30 from 10 units bid to 8 units bid. - Continue checking blood sugars daily at home - Follow-up in 3 months.  Diabetic Peripheral Neuropathy: Improved. - Continue Gabapentin 100mg  daily prn and Nortriptyline qhs   Hyman Bible, MD PGY-3

## 2017-09-08 NOTE — Addendum Note (Signed)
Addended by: Dorna Bloom on: 09/08/2017 11:44 AM   Modules accepted: Orders

## 2017-09-09 DIAGNOSIS — D631 Anemia in chronic kidney disease: Secondary | ICD-10-CM | POA: Diagnosis not present

## 2017-09-09 DIAGNOSIS — N186 End stage renal disease: Secondary | ICD-10-CM | POA: Diagnosis not present

## 2017-09-09 DIAGNOSIS — D509 Iron deficiency anemia, unspecified: Secondary | ICD-10-CM | POA: Diagnosis not present

## 2017-09-09 DIAGNOSIS — N2581 Secondary hyperparathyroidism of renal origin: Secondary | ICD-10-CM | POA: Diagnosis not present

## 2017-09-09 DIAGNOSIS — E1122 Type 2 diabetes mellitus with diabetic chronic kidney disease: Secondary | ICD-10-CM | POA: Diagnosis not present

## 2017-09-12 DIAGNOSIS — D631 Anemia in chronic kidney disease: Secondary | ICD-10-CM | POA: Diagnosis not present

## 2017-09-12 DIAGNOSIS — D509 Iron deficiency anemia, unspecified: Secondary | ICD-10-CM | POA: Diagnosis not present

## 2017-09-12 DIAGNOSIS — N186 End stage renal disease: Secondary | ICD-10-CM | POA: Diagnosis not present

## 2017-09-12 DIAGNOSIS — E1122 Type 2 diabetes mellitus with diabetic chronic kidney disease: Secondary | ICD-10-CM | POA: Diagnosis not present

## 2017-09-12 DIAGNOSIS — N2581 Secondary hyperparathyroidism of renal origin: Secondary | ICD-10-CM | POA: Diagnosis not present

## 2017-09-14 DIAGNOSIS — D631 Anemia in chronic kidney disease: Secondary | ICD-10-CM | POA: Diagnosis not present

## 2017-09-14 DIAGNOSIS — N2581 Secondary hyperparathyroidism of renal origin: Secondary | ICD-10-CM | POA: Diagnosis not present

## 2017-09-14 DIAGNOSIS — N186 End stage renal disease: Secondary | ICD-10-CM | POA: Diagnosis not present

## 2017-09-14 DIAGNOSIS — E1122 Type 2 diabetes mellitus with diabetic chronic kidney disease: Secondary | ICD-10-CM | POA: Diagnosis not present

## 2017-09-14 DIAGNOSIS — D509 Iron deficiency anemia, unspecified: Secondary | ICD-10-CM | POA: Diagnosis not present

## 2017-09-16 DIAGNOSIS — E1122 Type 2 diabetes mellitus with diabetic chronic kidney disease: Secondary | ICD-10-CM | POA: Diagnosis not present

## 2017-09-16 DIAGNOSIS — N2581 Secondary hyperparathyroidism of renal origin: Secondary | ICD-10-CM | POA: Diagnosis not present

## 2017-09-16 DIAGNOSIS — N186 End stage renal disease: Secondary | ICD-10-CM | POA: Diagnosis not present

## 2017-09-16 DIAGNOSIS — D509 Iron deficiency anemia, unspecified: Secondary | ICD-10-CM | POA: Diagnosis not present

## 2017-09-16 DIAGNOSIS — D631 Anemia in chronic kidney disease: Secondary | ICD-10-CM | POA: Diagnosis not present

## 2017-09-19 DIAGNOSIS — N186 End stage renal disease: Secondary | ICD-10-CM | POA: Diagnosis not present

## 2017-09-19 DIAGNOSIS — D631 Anemia in chronic kidney disease: Secondary | ICD-10-CM | POA: Diagnosis not present

## 2017-09-19 DIAGNOSIS — E1122 Type 2 diabetes mellitus with diabetic chronic kidney disease: Secondary | ICD-10-CM | POA: Diagnosis not present

## 2017-09-19 DIAGNOSIS — D509 Iron deficiency anemia, unspecified: Secondary | ICD-10-CM | POA: Diagnosis not present

## 2017-09-19 DIAGNOSIS — N2581 Secondary hyperparathyroidism of renal origin: Secondary | ICD-10-CM | POA: Diagnosis not present

## 2017-09-21 DIAGNOSIS — D631 Anemia in chronic kidney disease: Secondary | ICD-10-CM | POA: Diagnosis not present

## 2017-09-21 DIAGNOSIS — N186 End stage renal disease: Secondary | ICD-10-CM | POA: Diagnosis not present

## 2017-09-21 DIAGNOSIS — N2581 Secondary hyperparathyroidism of renal origin: Secondary | ICD-10-CM | POA: Diagnosis not present

## 2017-09-21 DIAGNOSIS — E1122 Type 2 diabetes mellitus with diabetic chronic kidney disease: Secondary | ICD-10-CM | POA: Diagnosis not present

## 2017-09-21 DIAGNOSIS — D509 Iron deficiency anemia, unspecified: Secondary | ICD-10-CM | POA: Diagnosis not present

## 2017-09-23 DIAGNOSIS — N186 End stage renal disease: Secondary | ICD-10-CM | POA: Diagnosis not present

## 2017-09-23 DIAGNOSIS — D509 Iron deficiency anemia, unspecified: Secondary | ICD-10-CM | POA: Diagnosis not present

## 2017-09-23 DIAGNOSIS — E1122 Type 2 diabetes mellitus with diabetic chronic kidney disease: Secondary | ICD-10-CM | POA: Diagnosis not present

## 2017-09-23 DIAGNOSIS — D631 Anemia in chronic kidney disease: Secondary | ICD-10-CM | POA: Diagnosis not present

## 2017-09-23 DIAGNOSIS — N2581 Secondary hyperparathyroidism of renal origin: Secondary | ICD-10-CM | POA: Diagnosis not present

## 2017-09-26 DIAGNOSIS — N2581 Secondary hyperparathyroidism of renal origin: Secondary | ICD-10-CM | POA: Diagnosis not present

## 2017-09-26 DIAGNOSIS — D631 Anemia in chronic kidney disease: Secondary | ICD-10-CM | POA: Diagnosis not present

## 2017-09-26 DIAGNOSIS — D509 Iron deficiency anemia, unspecified: Secondary | ICD-10-CM | POA: Diagnosis not present

## 2017-09-26 DIAGNOSIS — E1122 Type 2 diabetes mellitus with diabetic chronic kidney disease: Secondary | ICD-10-CM | POA: Diagnosis not present

## 2017-09-26 DIAGNOSIS — N186 End stage renal disease: Secondary | ICD-10-CM | POA: Diagnosis not present

## 2017-09-28 ENCOUNTER — Telehealth: Payer: Self-pay | Admitting: Internal Medicine

## 2017-09-28 DIAGNOSIS — D509 Iron deficiency anemia, unspecified: Secondary | ICD-10-CM | POA: Diagnosis not present

## 2017-09-28 DIAGNOSIS — N186 End stage renal disease: Secondary | ICD-10-CM | POA: Diagnosis not present

## 2017-09-28 DIAGNOSIS — D631 Anemia in chronic kidney disease: Secondary | ICD-10-CM | POA: Diagnosis not present

## 2017-09-28 DIAGNOSIS — N2581 Secondary hyperparathyroidism of renal origin: Secondary | ICD-10-CM | POA: Diagnosis not present

## 2017-09-28 DIAGNOSIS — E1122 Type 2 diabetes mellitus with diabetic chronic kidney disease: Secondary | ICD-10-CM | POA: Diagnosis not present

## 2017-09-28 NOTE — Telephone Encounter (Signed)
-----   Message from Sela Hua, MD sent at 09/08/2017 11:07 AM EDT ----- Hi, This patient had an annual diabetic eye exam at [facility] in July 2018. Can we please obtain records for this?  Thank you! Valetta Fuller

## 2017-09-30 ENCOUNTER — Other Ambulatory Visit: Payer: Self-pay | Admitting: Internal Medicine

## 2017-09-30 DIAGNOSIS — N2581 Secondary hyperparathyroidism of renal origin: Secondary | ICD-10-CM | POA: Diagnosis not present

## 2017-09-30 DIAGNOSIS — E1122 Type 2 diabetes mellitus with diabetic chronic kidney disease: Secondary | ICD-10-CM | POA: Diagnosis not present

## 2017-09-30 DIAGNOSIS — D631 Anemia in chronic kidney disease: Secondary | ICD-10-CM | POA: Diagnosis not present

## 2017-09-30 DIAGNOSIS — N186 End stage renal disease: Secondary | ICD-10-CM | POA: Diagnosis not present

## 2017-09-30 DIAGNOSIS — D509 Iron deficiency anemia, unspecified: Secondary | ICD-10-CM | POA: Diagnosis not present

## 2017-10-03 DIAGNOSIS — D631 Anemia in chronic kidney disease: Secondary | ICD-10-CM | POA: Diagnosis not present

## 2017-10-03 DIAGNOSIS — D509 Iron deficiency anemia, unspecified: Secondary | ICD-10-CM | POA: Diagnosis not present

## 2017-10-03 DIAGNOSIS — E1122 Type 2 diabetes mellitus with diabetic chronic kidney disease: Secondary | ICD-10-CM | POA: Diagnosis not present

## 2017-10-03 DIAGNOSIS — N2581 Secondary hyperparathyroidism of renal origin: Secondary | ICD-10-CM | POA: Diagnosis not present

## 2017-10-03 DIAGNOSIS — N186 End stage renal disease: Secondary | ICD-10-CM | POA: Diagnosis not present

## 2017-10-05 DIAGNOSIS — N186 End stage renal disease: Secondary | ICD-10-CM | POA: Diagnosis not present

## 2017-10-05 DIAGNOSIS — E1122 Type 2 diabetes mellitus with diabetic chronic kidney disease: Secondary | ICD-10-CM | POA: Diagnosis not present

## 2017-10-05 DIAGNOSIS — D509 Iron deficiency anemia, unspecified: Secondary | ICD-10-CM | POA: Diagnosis not present

## 2017-10-05 DIAGNOSIS — Z992 Dependence on renal dialysis: Secondary | ICD-10-CM | POA: Diagnosis not present

## 2017-10-05 DIAGNOSIS — D631 Anemia in chronic kidney disease: Secondary | ICD-10-CM | POA: Diagnosis not present

## 2017-10-05 DIAGNOSIS — N2581 Secondary hyperparathyroidism of renal origin: Secondary | ICD-10-CM | POA: Diagnosis not present

## 2017-10-07 DIAGNOSIS — D509 Iron deficiency anemia, unspecified: Secondary | ICD-10-CM | POA: Diagnosis not present

## 2017-10-07 DIAGNOSIS — N2581 Secondary hyperparathyroidism of renal origin: Secondary | ICD-10-CM | POA: Diagnosis not present

## 2017-10-07 DIAGNOSIS — E1122 Type 2 diabetes mellitus with diabetic chronic kidney disease: Secondary | ICD-10-CM | POA: Diagnosis not present

## 2017-10-07 DIAGNOSIS — N186 End stage renal disease: Secondary | ICD-10-CM | POA: Diagnosis not present

## 2017-10-07 DIAGNOSIS — D631 Anemia in chronic kidney disease: Secondary | ICD-10-CM | POA: Diagnosis not present

## 2017-10-10 DIAGNOSIS — N186 End stage renal disease: Secondary | ICD-10-CM | POA: Diagnosis not present

## 2017-10-10 DIAGNOSIS — D509 Iron deficiency anemia, unspecified: Secondary | ICD-10-CM | POA: Diagnosis not present

## 2017-10-10 DIAGNOSIS — D631 Anemia in chronic kidney disease: Secondary | ICD-10-CM | POA: Diagnosis not present

## 2017-10-10 DIAGNOSIS — N2581 Secondary hyperparathyroidism of renal origin: Secondary | ICD-10-CM | POA: Diagnosis not present

## 2017-10-10 DIAGNOSIS — E1122 Type 2 diabetes mellitus with diabetic chronic kidney disease: Secondary | ICD-10-CM | POA: Diagnosis not present

## 2017-10-12 ENCOUNTER — Other Ambulatory Visit: Payer: Self-pay | Admitting: Internal Medicine

## 2017-10-12 DIAGNOSIS — D509 Iron deficiency anemia, unspecified: Secondary | ICD-10-CM | POA: Diagnosis not present

## 2017-10-12 DIAGNOSIS — N2581 Secondary hyperparathyroidism of renal origin: Secondary | ICD-10-CM | POA: Diagnosis not present

## 2017-10-12 DIAGNOSIS — E1122 Type 2 diabetes mellitus with diabetic chronic kidney disease: Secondary | ICD-10-CM | POA: Diagnosis not present

## 2017-10-12 DIAGNOSIS — D631 Anemia in chronic kidney disease: Secondary | ICD-10-CM | POA: Diagnosis not present

## 2017-10-12 DIAGNOSIS — N186 End stage renal disease: Secondary | ICD-10-CM | POA: Diagnosis not present

## 2017-10-13 DIAGNOSIS — E113591 Type 2 diabetes mellitus with proliferative diabetic retinopathy without macular edema, right eye: Secondary | ICD-10-CM | POA: Diagnosis not present

## 2017-10-13 DIAGNOSIS — H35371 Puckering of macula, right eye: Secondary | ICD-10-CM | POA: Diagnosis not present

## 2017-10-14 DIAGNOSIS — N186 End stage renal disease: Secondary | ICD-10-CM | POA: Diagnosis not present

## 2017-10-14 DIAGNOSIS — D631 Anemia in chronic kidney disease: Secondary | ICD-10-CM | POA: Diagnosis not present

## 2017-10-14 DIAGNOSIS — N2581 Secondary hyperparathyroidism of renal origin: Secondary | ICD-10-CM | POA: Diagnosis not present

## 2017-10-14 DIAGNOSIS — E1122 Type 2 diabetes mellitus with diabetic chronic kidney disease: Secondary | ICD-10-CM | POA: Diagnosis not present

## 2017-10-14 DIAGNOSIS — D509 Iron deficiency anemia, unspecified: Secondary | ICD-10-CM | POA: Diagnosis not present

## 2017-10-17 DIAGNOSIS — N186 End stage renal disease: Secondary | ICD-10-CM | POA: Diagnosis not present

## 2017-10-17 DIAGNOSIS — N2581 Secondary hyperparathyroidism of renal origin: Secondary | ICD-10-CM | POA: Diagnosis not present

## 2017-10-17 DIAGNOSIS — D631 Anemia in chronic kidney disease: Secondary | ICD-10-CM | POA: Diagnosis not present

## 2017-10-17 DIAGNOSIS — D509 Iron deficiency anemia, unspecified: Secondary | ICD-10-CM | POA: Diagnosis not present

## 2017-10-17 DIAGNOSIS — E1122 Type 2 diabetes mellitus with diabetic chronic kidney disease: Secondary | ICD-10-CM | POA: Diagnosis not present

## 2017-10-19 DIAGNOSIS — N2581 Secondary hyperparathyroidism of renal origin: Secondary | ICD-10-CM | POA: Diagnosis not present

## 2017-10-19 DIAGNOSIS — N186 End stage renal disease: Secondary | ICD-10-CM | POA: Diagnosis not present

## 2017-10-19 DIAGNOSIS — D509 Iron deficiency anemia, unspecified: Secondary | ICD-10-CM | POA: Diagnosis not present

## 2017-10-19 DIAGNOSIS — E1122 Type 2 diabetes mellitus with diabetic chronic kidney disease: Secondary | ICD-10-CM | POA: Diagnosis not present

## 2017-10-19 DIAGNOSIS — D631 Anemia in chronic kidney disease: Secondary | ICD-10-CM | POA: Diagnosis not present

## 2017-10-21 DIAGNOSIS — E1122 Type 2 diabetes mellitus with diabetic chronic kidney disease: Secondary | ICD-10-CM | POA: Diagnosis not present

## 2017-10-21 DIAGNOSIS — N2581 Secondary hyperparathyroidism of renal origin: Secondary | ICD-10-CM | POA: Diagnosis not present

## 2017-10-21 DIAGNOSIS — D631 Anemia in chronic kidney disease: Secondary | ICD-10-CM | POA: Diagnosis not present

## 2017-10-21 DIAGNOSIS — N186 End stage renal disease: Secondary | ICD-10-CM | POA: Diagnosis not present

## 2017-10-21 DIAGNOSIS — D509 Iron deficiency anemia, unspecified: Secondary | ICD-10-CM | POA: Diagnosis not present

## 2017-10-23 DIAGNOSIS — D509 Iron deficiency anemia, unspecified: Secondary | ICD-10-CM | POA: Diagnosis not present

## 2017-10-23 DIAGNOSIS — N186 End stage renal disease: Secondary | ICD-10-CM | POA: Diagnosis not present

## 2017-10-23 DIAGNOSIS — E1122 Type 2 diabetes mellitus with diabetic chronic kidney disease: Secondary | ICD-10-CM | POA: Diagnosis not present

## 2017-10-23 DIAGNOSIS — D631 Anemia in chronic kidney disease: Secondary | ICD-10-CM | POA: Diagnosis not present

## 2017-10-23 DIAGNOSIS — N2581 Secondary hyperparathyroidism of renal origin: Secondary | ICD-10-CM | POA: Diagnosis not present

## 2017-10-25 DIAGNOSIS — D631 Anemia in chronic kidney disease: Secondary | ICD-10-CM | POA: Diagnosis not present

## 2017-10-25 DIAGNOSIS — N2581 Secondary hyperparathyroidism of renal origin: Secondary | ICD-10-CM | POA: Diagnosis not present

## 2017-10-25 DIAGNOSIS — D509 Iron deficiency anemia, unspecified: Secondary | ICD-10-CM | POA: Diagnosis not present

## 2017-10-25 DIAGNOSIS — N186 End stage renal disease: Secondary | ICD-10-CM | POA: Diagnosis not present

## 2017-10-25 DIAGNOSIS — E1122 Type 2 diabetes mellitus with diabetic chronic kidney disease: Secondary | ICD-10-CM | POA: Diagnosis not present

## 2017-10-26 ENCOUNTER — Other Ambulatory Visit: Payer: Self-pay

## 2017-10-26 ENCOUNTER — Encounter: Payer: Self-pay | Admitting: Internal Medicine

## 2017-10-26 ENCOUNTER — Ambulatory Visit (INDEPENDENT_AMBULATORY_CARE_PROVIDER_SITE_OTHER): Payer: Medicare Other | Admitting: Internal Medicine

## 2017-10-26 VITALS — BP 138/78 | HR 93 | Temp 98.1°F

## 2017-10-26 DIAGNOSIS — S81802A Unspecified open wound, left lower leg, initial encounter: Secondary | ICD-10-CM | POA: Insufficient documentation

## 2017-10-26 DIAGNOSIS — R05 Cough: Secondary | ICD-10-CM | POA: Diagnosis not present

## 2017-10-26 DIAGNOSIS — R053 Chronic cough: Secondary | ICD-10-CM | POA: Insufficient documentation

## 2017-10-26 MED ORDER — MUPIROCIN CALCIUM 2 % EX CREA: 1.0000 "application " | TOPICAL_CREAM | Freq: Two times a day (BID) | CUTANEOUS | 0 refills | Status: DC

## 2017-10-26 MED ORDER — PANTOPRAZOLE SODIUM 20 MG PO TBEC: 20.0000 mg | DELAYED_RELEASE_TABLET | Freq: Every day | ORAL | 0 refills | Status: DC

## 2017-10-26 NOTE — Patient Instructions (Signed)
It was so nice to see you!  I have prescribed some antibiotic ointment. Please put this on the lesion twice daily. You can keep it covered with a bandage during the day and take the bandage off at night.  If you notice any surrounding redness, drainage, fevers, or nausea, please come back to see [place]!  -Dr. Brett Albino

## 2017-10-26 NOTE — Assessment & Plan Note (Signed)
Unclear etiology. No signs of allergies. Patient denies heartburn. Not on an ACE. States the cough sounds a little different right before she goes to an HD session, but the cough is not improved after HD. Lungs clear, so don't think this is respiratory in etiology. - Will do a trial of a PPI x 4 weeks to see if this helps, as laryngotracheal reflux can be asymptomatic.  - Follow-up if no improvement

## 2017-10-26 NOTE — Assessment & Plan Note (Signed)
Superficial wound of the left lower extremity. Likely due to mild trauma to the area. No signs of infection.  - Prescribed topical bactroban to use for the next couple of weeks - Advised patient to have her boyfriend keep an eye on the wound, as patient is legally blind - Discussed signs of infection and reasons to return to care - Follow-up as needed

## 2017-10-26 NOTE — Progress Notes (Addendum)
   [facility] Phone: [phone]  Subjective:  [name] is a 51 year old female presenting to clinic for left lower extremity wound and chronic cough.   Left Lower Extremity Wound: The wound has been present for the last 2 weeks. She doesn't remember hitting her leg on anything. The wound started out as a bubble and then popped. The wound got slightly bigger at first, but has stayed the same size. One week ago, the lesion was draining, but she isn't sure what color it was because she is legally blind. The lesion hasn't drained since then. The nurses at HD have been keeping an eye on it and they haven't thought it was infected. No fevers, no nausea.  Chronic Cough: Has been going on for years. She feels like her cough sounds differently when she is about to have an HD session, however it is present all the time. The cough is not worse at night. She denies any congestion, rhinorrhea, or facial pain. She denies any heartburn.  ROS: See HPI for pertinent positives and negatives  Past Medical History- HTN, T2DM, right BKA, ESRD on HD, HLD  Family history reviewed for today's visit. No changes.  Social history- history of alcohol abuse but currently sober.  Objective: BP 138/78   Pulse 93   Temp 98.1 F (36.7 C) (Oral)   SpO2 97%  Gen: NAD, alert, cooperative with exam HEENT: Nasal turbinates pink and normal in appearance. Resp: CTAB, normal work of breathing Msk: 1cm x 1cm circular, superficial, flat, hyperpigmented lesion present on the medial side of the left lower extremity. No drainage. No surrounding erythema. Right BKA. No edema.  Assessment/Plan: Left Lower Extremity Wound: Superficial wound of the left lower extremity. Likely due to mild trauma to the area. No signs of infection.  - Prescribed topical bactroban to use for the next couple of weeks - Advised patient to have her boyfriend keep an eye on the wound, as patient is legally blind - Discussed signs  of infection and reasons to return to care - Follow-up as needed  Chronic Cough: Unclear etiology. No signs of allergies. Patient denies heartburn. Not on an ACE. States the cough sounds a little different right before she goes to an HD session, but the cough is not improved after HD. Lungs clear, so don't think this is respiratory in etiology. - Will do a trial of a PPI x 4 weeks to see if this helps, as laryngotracheal reflux can be asymptomatic.  - Follow-up if no improvement   Hyman Bible, MD PGY-3

## 2017-10-28 DIAGNOSIS — N2581 Secondary hyperparathyroidism of renal origin: Secondary | ICD-10-CM | POA: Diagnosis not present

## 2017-10-28 DIAGNOSIS — D509 Iron deficiency anemia, unspecified: Secondary | ICD-10-CM | POA: Diagnosis not present

## 2017-10-28 DIAGNOSIS — E1122 Type 2 diabetes mellitus with diabetic chronic kidney disease: Secondary | ICD-10-CM | POA: Diagnosis not present

## 2017-10-28 DIAGNOSIS — N186 End stage renal disease: Secondary | ICD-10-CM | POA: Diagnosis not present

## 2017-10-28 DIAGNOSIS — D631 Anemia in chronic kidney disease: Secondary | ICD-10-CM | POA: Diagnosis not present

## 2017-10-31 DIAGNOSIS — D509 Iron deficiency anemia, unspecified: Secondary | ICD-10-CM | POA: Diagnosis not present

## 2017-10-31 DIAGNOSIS — N186 End stage renal disease: Secondary | ICD-10-CM | POA: Diagnosis not present

## 2017-10-31 DIAGNOSIS — E1122 Type 2 diabetes mellitus with diabetic chronic kidney disease: Secondary | ICD-10-CM | POA: Diagnosis not present

## 2017-10-31 DIAGNOSIS — N2581 Secondary hyperparathyroidism of renal origin: Secondary | ICD-10-CM | POA: Diagnosis not present

## 2017-10-31 DIAGNOSIS — D631 Anemia in chronic kidney disease: Secondary | ICD-10-CM | POA: Diagnosis not present

## 2017-11-02 DIAGNOSIS — N186 End stage renal disease: Secondary | ICD-10-CM | POA: Diagnosis not present

## 2017-11-02 DIAGNOSIS — D509 Iron deficiency anemia, unspecified: Secondary | ICD-10-CM | POA: Diagnosis not present

## 2017-11-02 DIAGNOSIS — D631 Anemia in chronic kidney disease: Secondary | ICD-10-CM | POA: Diagnosis not present

## 2017-11-02 DIAGNOSIS — E1122 Type 2 diabetes mellitus with diabetic chronic kidney disease: Secondary | ICD-10-CM | POA: Diagnosis not present

## 2017-11-02 DIAGNOSIS — N2581 Secondary hyperparathyroidism of renal origin: Secondary | ICD-10-CM | POA: Diagnosis not present

## 2017-11-03 DIAGNOSIS — H1820 Unspecified corneal edema: Secondary | ICD-10-CM | POA: Diagnosis not present

## 2017-11-03 DIAGNOSIS — H5712 Ocular pain, left eye: Secondary | ICD-10-CM | POA: Diagnosis not present

## 2017-11-03 DIAGNOSIS — H548 Legal blindness, as defined in USA: Secondary | ICD-10-CM | POA: Diagnosis not present

## 2017-11-04 DIAGNOSIS — D631 Anemia in chronic kidney disease: Secondary | ICD-10-CM | POA: Diagnosis not present

## 2017-11-04 DIAGNOSIS — N2581 Secondary hyperparathyroidism of renal origin: Secondary | ICD-10-CM | POA: Diagnosis not present

## 2017-11-04 DIAGNOSIS — E1122 Type 2 diabetes mellitus with diabetic chronic kidney disease: Secondary | ICD-10-CM | POA: Diagnosis not present

## 2017-11-04 DIAGNOSIS — Z992 Dependence on renal dialysis: Secondary | ICD-10-CM | POA: Diagnosis not present

## 2017-11-04 DIAGNOSIS — N186 End stage renal disease: Secondary | ICD-10-CM | POA: Diagnosis not present

## 2017-11-04 DIAGNOSIS — D509 Iron deficiency anemia, unspecified: Secondary | ICD-10-CM | POA: Diagnosis not present

## 2017-11-07 DIAGNOSIS — D631 Anemia in chronic kidney disease: Secondary | ICD-10-CM | POA: Diagnosis not present

## 2017-11-07 DIAGNOSIS — E1122 Type 2 diabetes mellitus with diabetic chronic kidney disease: Secondary | ICD-10-CM | POA: Diagnosis not present

## 2017-11-07 DIAGNOSIS — N2581 Secondary hyperparathyroidism of renal origin: Secondary | ICD-10-CM | POA: Diagnosis not present

## 2017-11-07 DIAGNOSIS — D509 Iron deficiency anemia, unspecified: Secondary | ICD-10-CM | POA: Diagnosis not present

## 2017-11-07 DIAGNOSIS — N186 End stage renal disease: Secondary | ICD-10-CM | POA: Diagnosis not present

## 2017-11-11 DIAGNOSIS — N186 End stage renal disease: Secondary | ICD-10-CM | POA: Diagnosis not present

## 2017-11-11 DIAGNOSIS — E1122 Type 2 diabetes mellitus with diabetic chronic kidney disease: Secondary | ICD-10-CM | POA: Diagnosis not present

## 2017-11-11 DIAGNOSIS — D631 Anemia in chronic kidney disease: Secondary | ICD-10-CM | POA: Diagnosis not present

## 2017-11-11 DIAGNOSIS — N2581 Secondary hyperparathyroidism of renal origin: Secondary | ICD-10-CM | POA: Diagnosis not present

## 2017-11-11 DIAGNOSIS — D509 Iron deficiency anemia, unspecified: Secondary | ICD-10-CM | POA: Diagnosis not present

## 2017-11-16 ENCOUNTER — Emergency Department (HOSPITAL_COMMUNITY)
Admission: EM | Admit: 2017-11-16 | Discharge: 2017-11-16 | Disposition: A | Discharge status: Home / Self Care | Payer: Medicare Other | Attending: Emergency Medicine | Admitting: Emergency Medicine

## 2017-11-16 ENCOUNTER — Other Ambulatory Visit: Payer: Self-pay

## 2017-11-16 ENCOUNTER — Emergency Department (HOSPITAL_COMMUNITY): Payer: Medicare Other

## 2017-11-16 ENCOUNTER — Encounter (HOSPITAL_COMMUNITY): Payer: Self-pay

## 2017-11-16 DIAGNOSIS — E1122 Type 2 diabetes mellitus with diabetic chronic kidney disease: Secondary | ICD-10-CM | POA: Diagnosis not present

## 2017-11-16 DIAGNOSIS — Z89021 Acquired absence of right finger(s): Secondary | ICD-10-CM | POA: Diagnosis not present

## 2017-11-16 DIAGNOSIS — N189 Chronic kidney disease, unspecified: Secondary | ICD-10-CM | POA: Diagnosis not present

## 2017-11-16 DIAGNOSIS — J80 Acute respiratory distress syndrome: Secondary | ICD-10-CM | POA: Diagnosis not present

## 2017-11-16 DIAGNOSIS — Z89511 Acquired absence of right leg below knee: Secondary | ICD-10-CM | POA: Insufficient documentation

## 2017-11-16 DIAGNOSIS — Z89012 Acquired absence of left thumb: Secondary | ICD-10-CM | POA: Diagnosis not present

## 2017-11-16 DIAGNOSIS — N179 Acute kidney failure, unspecified: Secondary | ICD-10-CM | POA: Diagnosis not present

## 2017-11-16 DIAGNOSIS — Z794 Long term (current) use of insulin: Secondary | ICD-10-CM | POA: Diagnosis not present

## 2017-11-16 DIAGNOSIS — N186 End stage renal disease: Secondary | ICD-10-CM | POA: Diagnosis not present

## 2017-11-16 DIAGNOSIS — R531 Weakness: Secondary | ICD-10-CM | POA: Diagnosis not present

## 2017-11-16 DIAGNOSIS — E1140 Type 2 diabetes mellitus with diabetic neuropathy, unspecified: Secondary | ICD-10-CM | POA: Insufficient documentation

## 2017-11-16 DIAGNOSIS — R000 Tachycardia, unspecified: Secondary | ICD-10-CM | POA: Diagnosis not present

## 2017-11-16 DIAGNOSIS — Z79899 Other long term (current) drug therapy: Secondary | ICD-10-CM | POA: Insufficient documentation

## 2017-11-16 DIAGNOSIS — Z992 Dependence on renal dialysis: Secondary | ICD-10-CM | POA: Diagnosis not present

## 2017-11-16 DIAGNOSIS — I120 Hypertensive chronic kidney disease with stage 5 chronic kidney disease or end stage renal disease: Secondary | ICD-10-CM | POA: Diagnosis not present

## 2017-11-16 DIAGNOSIS — J989 Respiratory disorder, unspecified: Secondary | ICD-10-CM | POA: Diagnosis not present

## 2017-11-16 LAB — BASIC METABOLIC PANEL
Anion gap: 20 — ABNORMAL HIGH (ref 5–15)
BUN: 98 mg/dL — ABNORMAL HIGH (ref 6–20)
CO2: 14 mmol/L — ABNORMAL LOW (ref 22–32)
Calcium: 9.1 mg/dL (ref 8.9–10.3)
Chloride: 104 mmol/L (ref 101–111)
Creatinine, Ser: 10.94 mg/dL — ABNORMAL HIGH (ref 0.44–1.00)
GFR calc Af Amer: 4 mL/min — ABNORMAL LOW (ref >60)
GFR calc non Af Amer: 4 mL/min — ABNORMAL LOW (ref >60)
Glucose, Bld: 136 mg/dL — ABNORMAL HIGH (ref 65–99)
Potassium: 6 mmol/L — ABNORMAL HIGH (ref 3.5–5.1)
Sodium: 138 mmol/L (ref 135–145)

## 2017-11-16 MED ORDER — ACETAMINOPHEN 325 MG PO TABS
650.0000 mg | ORAL_TABLET | Freq: Once | ORAL | Status: AC
  Administered 2017-11-16: 650 mg via ORAL
  Filled 2017-11-16: qty 2

## 2017-11-16 MED ORDER — SODIUM POLYSTYRENE SULFONATE 15 GM/60ML PO SUSP
30.0000 g | Freq: Once | ORAL | Status: AC
  Administered 2017-11-16: 30 g via ORAL
  Filled 2017-11-16: qty 120

## 2017-11-16 NOTE — Progress Notes (Addendum)
CSW spoke with evening CM. CM put in order for [facility].   Provided information and resources for [employer] and [employer]. [address] provides transportation for those with Medicaid.   Pt does not have her wheelchair with her. Pt came in via EMS. Pt will return via [facility].   Wendelyn Breslow, [facility]  [phone]

## 2017-11-16 NOTE — Discharge Instructions (Signed)
Go to your dialysis center tomorrow at 1230.  They have an appointment for you then

## 2017-11-16 NOTE — ED Notes (Signed)
Mariann Laster with CSW called [facility] so patient may go home. Pt to have dialysis tomorrow at 12:30 with transportation [employer]).

## 2017-11-16 NOTE — Discharge Planning (Signed)
HD pt goes to [facility] M,W,F.

## 2017-11-16 NOTE — ED Provider Notes (Signed)
[facility] EMERGENCY DEPARTMENT Provider Note   CSN: [id number] Arrival date & time: 11/16/17  1111     History   Chief Complaint Chief Complaint  Patient presents with  . Vascular Access Problem    HPI [name] is a 51 y.o. female.  Patient states that no one picked her up for dialysis today she was supposed to have dialysis Monday Wednesday.   The history is provided by the patient.  Illness  This is a chronic problem. The current episode started 12 to 24 hours ago. The problem occurs constantly. The problem has not changed since onset.Pertinent negatives include no chest pain, no abdominal pain and no headaches. Nothing aggravates the symptoms. Nothing relieves the symptoms. She has tried nothing for the symptoms. The treatment provided no relief.    Past Medical History:  Diagnosis Date  . Alcohol dependency ([place])   . Anemia   . Chronic kidney disease   . Diabetes mellitus    type 2  . Diabetes mellitus type 2, uncontrolled ([place])   . Hypertension   . Legally blind in left eye, as defined in [place]   . Necrotizing fasciitis ([place])   . PONV (postoperative nausea and vomiting)     Patient Active Problem List   Diagnosis Date Noted  . Open wound of left lower extremity 10/26/2017  . Chronic cough 10/26/2017  . Healthcare maintenance 05/24/2017  . Diabetic peripheral neuropathy ([place]) 03/17/2017  . Hyperlipidemia 03/17/2017  . S/P BKA (below knee amputation) unilateral, right ([place]) 01/04/2017  . ESRD on dialysis ([place]) 01/04/2017  . History of alcohol abuse 01/04/2017  . Legally blind 01/04/2017  . Type 2 diabetes mellitus ([place]) 09/01/2011  . Hypertension 09/01/2011  . Anemia     Past Surgical History:  Procedure Laterality Date  . AMPUTATION     right first and left middle finger amputation 2/2 OM 08/2011 by Dr. Amedeo Plenty  . AMPUTATION  01/18/2012   Procedure: AMPUTATION DIGIT;  Surgeon: Tennis Must, MD;  Location: [facility];  Service: Orthopedics;  Laterality: Left;  revision amputation left thumb  . AMPUTATION  08/25/2012   Procedure: AMPUTATION BELOW KNEE;  Surgeon: Newt Minion, MD;  Location: [facility];  Service: Orthopedics;  Laterality: Right;  Right Below Knee Amputation  . BASCILIC VEIN TRANSPOSITION Left 07/22/2015   Procedure: BASILIC VEIN TRANSPOSITION ;  Surgeon: Elam Dutch, MD;  Location: [facility];  Service: Vascular;  Laterality: Left;  . CATARACT EXTRACTION     right   . CESAREAN SECTION  1986  . CESAREAN SECTION  1985  . CESAREAN SECTION  1991  . I&D EXTREMITY  12/24/2011   Procedure: IRRIGATION AND DEBRIDEMENT EXTREMITY;  Surgeon: Tennis Must, MD;  Location: [place] [facility];  Service: Orthopedics;  Laterality: Left;  I&Dleft thumb with partial amputation  . I&D EXTREMITY  12/26/2011   Procedure: IRRIGATION AND DEBRIDEMENT EXTREMITY;  Surgeon: Tennis Must, MD;  Location: [facility];  Service: Orthopedics;  Laterality: Left;  . I&D EXTREMITY  12/23/2011   Procedure: IRRIGATION AND DEBRIDEMENT EXTREMITY;  Surgeon: Tennis Must, MD;  Location: [facility];  Service: Orthopedics;  Laterality: Left;  I&D Left thumb, hand and arm  . I&D left thumb  12/23/2011  . TUBAL LIGATION      OB History    No data available       Home Medications    Prior to Admission medications   Medication Sig Start Date End  Date Taking? Authorizing Provider  amLODipine (NORVASC) 10 MG tablet TAKE 1 TABLET BY MOUTH ONCE DAILY 09/30/17   Mayo, Pete Pelt, MD  atorvastatin (LIPITOR) 40 MG tablet Take 1 tablet (40 mg total) by mouth daily. 05/24/17   Veatrice Bourbon, MD  EASY TOUCH PEN NEEDLES 32G X 6 MM MISC USE TO INJECT INSULIN AS DIRECTED 08/16/17   Mayo, Pete Pelt, MD  gabapentin (NEURONTIN) 100 MG capsule Take 1 capsule (100 mg total) by mouth daily as needed. 09/08/17   Mayo, Pete Pelt, MD  Insulin NPH Isophane & Regular (HUMULIN 70/30 KWIKPEN [place]) Inject 8 Units into the skin 2 (two) times daily.    [provider]    losartan (COZAAR) 50 MG tablet TAKE 2 TABLETS BY MOUTH DAILY 10/13/17   Mayo, Pete Pelt, MD  metoprolol (LOPRESSOR) 50 MG tablet Take 1.5 tablets (75 mg total) by mouth 2 (two) times daily. 03/17/17   Mayo, Pete Pelt, MD  mupirocin cream (BACTROBAN) 2 % Apply 1 application topically 2 (two) times daily. 10/26/17   Mayo, Pete Pelt, MD  nortriptyline (PAMELOR) 25 MG capsule TAKE 1 CAPSULE BY MOUTH AT BEDTIME 06/16/17   Mayo, Pete Pelt, MD  pantoprazole (PROTONIX) 20 MG tablet Take 1 tablet (20 mg total) by mouth daily. 10/26/17   Mayo, Pete Pelt, MD    Family History Family History  Problem Relation Age of Onset  . Diabetes Mother   . Alcohol abuse Mother   . Diabetes Father   . Alzheimer's disease Father   . Hypertension Father   . Kidney failure Sister   . Diabetes Sister   . High blood pressure Sister     Social History Social History   Tobacco Use  . Smoking status: Never Smoker  . Smokeless tobacco: Never Used  Substance Use Topics  . Alcohol use: Yes    Alcohol/week: 0.6 oz    Types: 1 Cans of beer per week    Comment: h/o alcohol abuse 4 years ago (07/21/15)  . Drug use: Yes    Types: Cocaine    Comment:  last used cocaine 4 years ago (07/21/15)     Allergies   Patient has no known allergies.   Review of Systems Review of Systems  Constitutional: Negative for appetite change and fatigue.  HENT: Negative for congestion, ear discharge and sinus pressure.   Eyes: Negative for discharge.  Respiratory: Negative for cough.   Cardiovascular: Negative for chest pain.  Gastrointestinal: Negative for abdominal pain and diarrhea.  Genitourinary: Negative for frequency and hematuria.  Musculoskeletal: Negative for back pain.  Skin: Negative for rash.  Neurological: Negative for seizures and headaches.  Psychiatric/Behavioral: Negative for hallucinations.     Physical Exam Updated Vital Signs BP (!) 166/80 (BP Location: Right Arm)   Pulse 100   Temp 97.9 F (36.6 C)    Resp 18   Ht 5\' 7"  (1.702 m)   Wt 92.5 kg (204 lb)   SpO2 100%   BMI 31.95 kg/m   Physical Exam  Constitutional: She is oriented to person, place, and time. She appears well-developed.  HENT:  Head: Normocephalic.  Eyes: Conjunctivae and EOM are normal. No scleral icterus.  Neck: Neck supple. No thyromegaly present.  Cardiovascular: Normal rate and regular rhythm. Exam reveals no gallop and no friction rub.  No murmur heard. Pulmonary/Chest: No stridor. She has no wheezes. She has no rales. She exhibits no tenderness.  Abdominal: She exhibits no distension. There is no tenderness. There  is no rebound.  Musculoskeletal: Normal range of motion. She exhibits no edema.  Partial leg amputation  Lymphadenopathy:    She has no cervical adenopathy.  Neurological: She is oriented to person, place, and time. She exhibits normal muscle tone. Coordination normal.  Skin: No rash noted. No erythema.  Psychiatric: She has a normal mood and affect. Her behavior is normal.     ED Treatments / Results  Labs (all labs ordered are listed, but only abnormal results are displayed) Labs Reviewed  BASIC METABOLIC PANEL - Abnormal; Notable for the following components:      Result Value   Potassium 6.0 (*)    CO2 14 (*)    Glucose, Bld 136 (*)    BUN 98 (*)    Creatinine, Ser 10.94 (*)    GFR calc non Af Amer 4 (*)    GFR calc Af Amer 4 (*)    Anion gap 20 (*)    All other components within normal limits    EKG  EKG Interpretation None       Radiology Dg Chest 2 View  Result Date: 11/16/2017 CLINICAL DATA:  Weakness EXAM: CHEST  2 VIEW COMPARISON:  08/20/2012 FINDINGS: Heart is upper limits normal in size. Lungs are clear. No effusions. No acute bony abnormality. IMPRESSION: No active cardiopulmonary disease. Electronically Signed   By: Rolm Baptise M.D.   On: 11/16/2017 12:11    Procedures Procedures (including critical care time)  Medications Ordered in ED Medications   acetaminophen (TYLENOL) tablet 650 mg (not administered)  sodium polystyrene (KAYEXALATE) 15 GM/60ML suspension 30 g (30 g Oral Given 11/16/17 1458)     Initial Impression / Assessment and Plan / ED Course  I have reviewed the triage vital signs and the nursing notes.  Pertinent labs & imaging results that were available during my care of the patient were reviewed by me and considered in my medical decision making (see chart for details).   Nephrology agrees patient can be taken care of to more at her dialysis center.  They have made arrangements for her to  have an appointment at 1230.  Patient states she still no longer has a ride over there.  People normally take her have stated they will not take her anymore because she has a bed bug problem.  Social worker will see patient to see if she can make arrangements    Final Clinical Impressions(s) / ED Diagnoses   Final diagnoses:  None    ED Discharge Orders    None       Milton Ferguson, MD 11/16/17 [phone]

## 2017-11-16 NOTE — ED Triage Notes (Signed)
Pt arrives EMS from home and states sher was unable to get to diualysis on Monday due to snow and unable to get trnsport today due to snow. Denies any complaints. Pt has right BKA and is wheelchair bound. Alert and oriented.

## 2017-11-16 NOTE — Clinical Social Work Note (Signed)
Clinical Social Work Assessment  Patient Details  Name: [name] MRN: [id number] Date of Birth: 10-11-1966  Date of referral:  11/16/17               Reason for consult:  Transportation                Permission sought to share information with:  Case Manager Permission granted to share information::  Yes, Verbal Permission Granted  Name::        Agency::     Relationship::     Contact Information:     Housing/Transportation Living arrangements for the past 2 months:  Apartment Source of Information:  Patient Patient Interpreter Needed:  None Criminal Activity/Legal Involvement Pertinent to Current Situation/Hospitalization:    Significant Relationships:  Friend, Adult Children Lives with:  Friends Do you feel safe going back to the place where you live?  Yes Need for family participation in patient care:  Yes (Comment)  Care giving concerns:  Concerns about pt being transported to dialysis.    Social Worker assessment / plan:  CSW consult for transportation concerns. CSW met with pt via bedside. Pt informed CSW that she normally utilizes [employer] to get to and from her dialysis appointments. Pt stated that she currently has a bug issue in her apartment. She cannot return to riding [employer] until the bug issue is resolved and prove is provided to [employer]. CSW and pt spoke about [employer] and taking the bus. CSW asked about personal support pt might have to take her to her appointment tomorrow. PT informed CSW that she has a dialysis appointment scheduled for tomorrow at 52. Pt stated that her daughter would not be able to take her because of work. Pt explained that she got into this situation because of family members being unwilling to take her to her doctor's appointments.   Employment status:  Disabled (Comment on whether or not currently receiving Disability) Insurance information:  Medicare, Medicaid In [place] PT Recommendations:  Not assessed at this time Information /  Referral to community resources:  Other (Comment Required)(Transportation)  Patient/Family's Response to care:  Pt agreeable to plan of care.  Patient/Family's Understanding of and Emotional Response to Diagnosis, Current Treatment, and Prognosis:  Pt did not express any questions or concerns to CSW at this time.   Emotional Assessment Appearance:  Appears stated age Attitude/Demeanor/Rapport:    Affect (typically observed):  Accepting, Adaptable, Appropriate Orientation:  Oriented to Self, Oriented to Situation, Oriented to Place, Oriented to  Time Alcohol / Substance use:    Psych involvement (Current and /or in the community):  No (Comment)  Discharge Needs  Concerns to be addressed:  Care Coordination Readmission within the last 30 days:  No Current discharge risk:  Other(Transportation to Dialysis ) Barriers to Discharge:  No Barriers Identified   Wendelyn Breslow, LCSW 11/16/2017, 4:45 PM

## 2017-11-16 NOTE — ED Notes (Signed)
Pt resting quietly. Pt was previously triage under another name. No change in complaint or condition.

## 2017-11-16 NOTE — ED Notes (Signed)
Social work at bedside.  

## 2017-11-16 NOTE — ED Notes (Signed)
Pt discharge paperwork and transport paper work given to [employer]

## 2017-11-17 ENCOUNTER — Telehealth: Payer: Self-pay | Admitting: *Deleted

## 2017-11-17 NOTE — Consult Note (Signed)
   [facility] Inpatient Consult   11/17/2017  [name] Apr 30, 1966 [id number]     [facility] Care Management refrerral received from the ED.  Patient discharged from the ED prior to hospital liasion contact. It appears transportation assistance is requested.  Sent referral back to [place] Management office to assign appropriately.    Marthenia Rolling, MSN-Ed, RN,BSN [facility] Liaison [phone]

## 2017-11-17 NOTE — Telephone Encounter (Signed)
[name] from [employer] they stopped transportation for this pt due to bedbug infestation.  Irma states they will need confirmation that bedbug problem has be resolved before they can resume services for this patient.

## 2017-11-17 NOTE — Care Management (Signed)
11/17/17 14:35 Wendi Maya RN NCM [phone] ED NCM received call from [facility] stating they are not able to render transport services to HD center for this patient because they received information stating patient has bed bugs and cannot place other clients at risk. Patient missed several treatments due transportation patient is a lt  BKA in a w/c. Patient is active at the [facility] last visit was 11/12. CM placed a [facility] referral yesterday and will contact the CSW at the clinic to assist patient with this issue. Patient needs assistance to reduce unnecessary ED utilization. CM will follow up with patient.

## 2017-11-18 ENCOUNTER — Other Ambulatory Visit: Payer: Self-pay | Admitting: Licensed Clinical Social Worker

## 2017-11-18 ENCOUNTER — Other Ambulatory Visit: Payer: Self-pay | Admitting: *Deleted

## 2017-11-18 DIAGNOSIS — D509 Iron deficiency anemia, unspecified: Secondary | ICD-10-CM | POA: Diagnosis not present

## 2017-11-18 DIAGNOSIS — E1122 Type 2 diabetes mellitus with diabetic chronic kidney disease: Secondary | ICD-10-CM | POA: Diagnosis not present

## 2017-11-18 DIAGNOSIS — N2581 Secondary hyperparathyroidism of renal origin: Secondary | ICD-10-CM | POA: Diagnosis not present

## 2017-11-18 DIAGNOSIS — D631 Anemia in chronic kidney disease: Secondary | ICD-10-CM | POA: Diagnosis not present

## 2017-11-18 DIAGNOSIS — N186 End stage renal disease: Secondary | ICD-10-CM | POA: Diagnosis not present

## 2017-11-18 NOTE — Patient Outreach (Signed)
[name]) Care Management  11/18/2017  [name] [birth year] [id number]  Assessment- [facility] CSW and [facility] Assistant Clinical Director of Care Management sent email to [name] requesting information on patient. CSW received return email stating that patient receives dialysis at [facility] and that the social worker there [name] is aware of the situation. CSW completed call to [name] at [phone] but she was out of the office on lunch. CSW left a voice message encouraging a return call once available in order to collaborate on patient.   CSW received return call from [name], [name] at [name]. She reports that patient was able to successfully to get to dialysis center today and is there now. CSW informed her that CSW has not been able to establish contact with patient yet. [name] reports that patient is not able to ride any form of public transportation which is why they have not used [employer] or any other sources of transportation as an option at this time. [name] reports that she feels that patient went to ED mostly due to the snow and not being able to get to dialysis than due to the bed bugs. CSW questioned how patient was able to get to dialysis center today and CSW [name] stated that she was not sure as she had not physically seen patient yet. [name] reports that patient's family (significant other who lives with her, son, daughter in law and nephew) assist with providing patient with transportation as well and assumes one of them provided transportation for patient today. [name] informed CSW that patient usually is able to attend dialysis 2-3 times a week without transportation issues. Unfortunately, there are limited resources for bed bug issues and CSW [name] has been in contact with patient's apartment complex and they have stated that it is the tenant's responsibility to pay for the bed bug treatment. However, apartment complex has  been in touch with their pest control company and informed them of the urgent need to complete treatment and they will be scheduling a treatment soon. The apartment complex is willing to set up a payment plan for patient to pay for the bed bug treatment. [name] reports looking into some resources within their company to see if they can assist with these payments but that they may not be able to cover the total treatment cost. CSW [name] is agreeable to contact this CSW back after she meets with patient.  CSW completed call to [place] Director of Care Management and updated her.  CSW completed call to [employer] transportation and left a message with scheduling coordinator.  Plan-CSW will continue to follow case and will assist for return calls.  [name], BSW, MSW, [name] Phone: [phone] Fax: [phone]

## 2017-11-18 NOTE — Progress Notes (Signed)
This encounter was created in error - please disregard.

## 2017-11-18 NOTE — Patient Outreach (Signed)
[place] [facility]) Care Management  11/18/2017  [name] 1966-04-16 [id number]  Assessment-CSW completed outreach attempt today after receiving referral for transportation assistance. CSW unable to reach patient successfully. CSW left a HIPPA compliant voice message encouraging patient to return call once available.  Plan-CSW will await return call or will complete second outreach attempt within one week.   [name], BSW, MSW, [email or web address] Phone: [phone] Fax: [phone]

## 2017-11-18 NOTE — Patient Outreach (Signed)
[facility]) Care Management  11/18/2017  [name] 13-May-1966 [id number]  Assessment- CSW completed additional outreach call to patient today but was unsuccessful in reaching her. HIPPA compliant voice message was left. CSW received return call from [place]. She reports that she met with patient today. She shares that her friend was able to provide stable transportation to dialysis for her today and that she is going to be asking that same friend to transport her to dialysis on Monday as well. CSW informed SW that she completed another outreach call to patient but was not able to reach her and SW reports that sometimes it can be difficult to reach patient by phone. SW states that she heard back from apartment complex and they should be scheduling a pest control treatment next week. After pest control treatment is completed, hopefully patient will be able to use public transportation again in order to get to medical appointments. CSW completed call to patient's apartment complex at [phone] and spoke with Caryl Pina. She reports that they were not able to complete their pest control treatment sooner because they had just changed pest control agencies and they were not able to start working on their apartment complex yet. Apartment complex is only able to use their own pest control company (as [place] informed them that she had her own pest control contacts but apartment complex declined). Caryl Pina reports that their pest control company will be scheduling their appointment with patient for next week to treat the bed bugs. CSW encouraged apartment complex staff that it would be helpful to patient to do it as soon as possible.   Plan-CSW will complete additional outreach attempt to patient next week and try to establish contact.  [name], BSW, MSW, [email or web address] Phone: [phone] Fax:  [phone]

## 2017-11-18 NOTE — Patient Outreach (Signed)
Received call from [facility], evening ED RNCM on 11/17/17 around 630pm. States referral for [facility] Care Management is because Ms. Trochez has bedbugs and transportation will not transport her to HD because of this. It appears [facility] LCSW consult referral is what is needed.  Updated [place] Management staff.  Marthenia Rolling, MSN-Ed, RN,BSN [facility] Liaison [phone]

## 2017-11-21 ENCOUNTER — Other Ambulatory Visit: Payer: Self-pay | Admitting: Licensed Clinical Social Worker

## 2017-11-21 DIAGNOSIS — N186 End stage renal disease: Secondary | ICD-10-CM | POA: Diagnosis not present

## 2017-11-21 DIAGNOSIS — D631 Anemia in chronic kidney disease: Secondary | ICD-10-CM | POA: Diagnosis not present

## 2017-11-21 DIAGNOSIS — N2581 Secondary hyperparathyroidism of renal origin: Secondary | ICD-10-CM | POA: Diagnosis not present

## 2017-11-21 DIAGNOSIS — D509 Iron deficiency anemia, unspecified: Secondary | ICD-10-CM | POA: Diagnosis not present

## 2017-11-21 DIAGNOSIS — E1122 Type 2 diabetes mellitus with diabetic chronic kidney disease: Secondary | ICD-10-CM | POA: Diagnosis not present

## 2017-11-21 NOTE — Patient Outreach (Signed)
[place] [facility]) Care Management  11/21/2017  [name] 1966-08-24 [id number]  Assessment- CSW did not receive return call from patient and continues to have difficulty establishing contact with her. CSW completed call to [facility] and confirmed with front desk that patient was able to come to dialysis today.  Plan-CSW will continue to make outreach attempts to patient but seems as if patient has been able to have family and friends transport her to dialysis until she can receive her bed bug treatment in her apartment and then be able to use public transportation again. Pest control treatment is scheduled to take place this week.   [name], BSW, MSW, [email or web address] Phone: [phone] Fax: [phone]

## 2017-11-21 NOTE — Patient Outreach (Signed)
[place] [facility]) Care Management  11/21/2017  [name] June 11, 1966 [id number]  Assessment-CSW completed second outreach attempt today to follow up on patient's transportation needs and to ensure that patient is able to go to dialysis today. CSW unable to reach patient successfully. CSW left a HIPPA compliant voice message encouraging patient to return call once available.  Plan-CSW will await return call or complete an additional outreach if needed.  [name], BSW, MSW, [email or web address] Phone: [phone] Fax: [phone]

## 2017-11-22 ENCOUNTER — Other Ambulatory Visit: Payer: Self-pay | Admitting: Licensed Clinical Social Worker

## 2017-11-22 ENCOUNTER — Encounter: Payer: Self-pay | Admitting: Licensed Clinical Social Worker

## 2017-11-22 NOTE — Patient Outreach (Signed)
[facility]) Care Management  11/22/2017  [name] 1966/06/01 [id number]  Assessment-CSW completed additional outreach attempt today. CSW unable to reach patient successfully. CSW left a HIPPA compliant voice message encouraging patient to return call once available.  Plan-CSW continues to have difficulty establishing contact with patient. Several outreach attempts have been made but have been unsuccessful. CSW will complete final outreach call within one week and send outreach barrier letter.  [name], BSW, MSW, [name] Phone: [phone] Fax: [phone]

## 2017-11-23 ENCOUNTER — Other Ambulatory Visit: Payer: Self-pay | Admitting: Internal Medicine

## 2017-11-23 DIAGNOSIS — N2581 Secondary hyperparathyroidism of renal origin: Secondary | ICD-10-CM | POA: Diagnosis not present

## 2017-11-23 DIAGNOSIS — D509 Iron deficiency anemia, unspecified: Secondary | ICD-10-CM | POA: Diagnosis not present

## 2017-11-23 DIAGNOSIS — D631 Anemia in chronic kidney disease: Secondary | ICD-10-CM | POA: Diagnosis not present

## 2017-11-23 DIAGNOSIS — E1122 Type 2 diabetes mellitus with diabetic chronic kidney disease: Secondary | ICD-10-CM | POA: Diagnosis not present

## 2017-11-23 DIAGNOSIS — N186 End stage renal disease: Secondary | ICD-10-CM | POA: Diagnosis not present

## 2017-11-24 ENCOUNTER — Other Ambulatory Visit: Payer: Self-pay | Admitting: Licensed Clinical Social Worker

## 2017-11-24 NOTE — Patient Outreach (Signed)
[name]) Care Management  11/24/2017  [name] 20-Jun-1966 [id number]  Assessment- CSW completed call to SW at [place]. She reports that patient was able to go to dialysis yesterday as well. She reports that she has been in contact with patient's apartment staff members and that the pest treatment has been somewhat delayed. SW reports that the apartment complex sent an email to the pest control company in regards to patient's urgent needs and cc'd SW. We are hoping that pest treatment can be completed this week and hopefully patient will be able to start using public transportation again. CSW made another outreach attempt to patient without success. SW confirmed that patient does have complications with phone at this time. CSW will send outreach barrier at this time and wait 10 business days to hear back before closing.  Plan-CSW will send outreach barrier letter out at this time.  [name], BSW, MSW, [email or web address] Phone: [phone] Fax: [phone]

## 2017-11-25 DIAGNOSIS — N2581 Secondary hyperparathyroidism of renal origin: Secondary | ICD-10-CM | POA: Diagnosis not present

## 2017-11-25 DIAGNOSIS — N186 End stage renal disease: Secondary | ICD-10-CM | POA: Diagnosis not present

## 2017-11-25 DIAGNOSIS — D509 Iron deficiency anemia, unspecified: Secondary | ICD-10-CM | POA: Diagnosis not present

## 2017-11-25 DIAGNOSIS — D631 Anemia in chronic kidney disease: Secondary | ICD-10-CM | POA: Diagnosis not present

## 2017-11-25 DIAGNOSIS — E1122 Type 2 diabetes mellitus with diabetic chronic kidney disease: Secondary | ICD-10-CM | POA: Diagnosis not present

## 2017-11-27 DIAGNOSIS — E1122 Type 2 diabetes mellitus with diabetic chronic kidney disease: Secondary | ICD-10-CM | POA: Diagnosis not present

## 2017-11-27 DIAGNOSIS — N186 End stage renal disease: Secondary | ICD-10-CM | POA: Diagnosis not present

## 2017-11-27 DIAGNOSIS — D631 Anemia in chronic kidney disease: Secondary | ICD-10-CM | POA: Diagnosis not present

## 2017-11-27 DIAGNOSIS — D509 Iron deficiency anemia, unspecified: Secondary | ICD-10-CM | POA: Diagnosis not present

## 2017-11-27 DIAGNOSIS — N2581 Secondary hyperparathyroidism of renal origin: Secondary | ICD-10-CM | POA: Diagnosis not present

## 2017-11-30 DIAGNOSIS — D631 Anemia in chronic kidney disease: Secondary | ICD-10-CM | POA: Diagnosis not present

## 2017-11-30 DIAGNOSIS — N186 End stage renal disease: Secondary | ICD-10-CM | POA: Diagnosis not present

## 2017-11-30 DIAGNOSIS — E1122 Type 2 diabetes mellitus with diabetic chronic kidney disease: Secondary | ICD-10-CM | POA: Diagnosis not present

## 2017-11-30 DIAGNOSIS — D509 Iron deficiency anemia, unspecified: Secondary | ICD-10-CM | POA: Diagnosis not present

## 2017-11-30 DIAGNOSIS — N2581 Secondary hyperparathyroidism of renal origin: Secondary | ICD-10-CM | POA: Diagnosis not present

## 2017-12-02 DIAGNOSIS — D509 Iron deficiency anemia, unspecified: Secondary | ICD-10-CM | POA: Diagnosis not present

## 2017-12-02 DIAGNOSIS — N2581 Secondary hyperparathyroidism of renal origin: Secondary | ICD-10-CM | POA: Diagnosis not present

## 2017-12-02 DIAGNOSIS — N186 End stage renal disease: Secondary | ICD-10-CM | POA: Diagnosis not present

## 2017-12-02 DIAGNOSIS — D631 Anemia in chronic kidney disease: Secondary | ICD-10-CM | POA: Diagnosis not present

## 2017-12-02 DIAGNOSIS — E1122 Type 2 diabetes mellitus with diabetic chronic kidney disease: Secondary | ICD-10-CM | POA: Diagnosis not present

## 2017-12-04 DIAGNOSIS — E1122 Type 2 diabetes mellitus with diabetic chronic kidney disease: Secondary | ICD-10-CM | POA: Diagnosis not present

## 2017-12-04 DIAGNOSIS — D631 Anemia in chronic kidney disease: Secondary | ICD-10-CM | POA: Diagnosis not present

## 2017-12-04 DIAGNOSIS — N186 End stage renal disease: Secondary | ICD-10-CM | POA: Diagnosis not present

## 2017-12-04 DIAGNOSIS — D509 Iron deficiency anemia, unspecified: Secondary | ICD-10-CM | POA: Diagnosis not present

## 2017-12-04 DIAGNOSIS — N2581 Secondary hyperparathyroidism of renal origin: Secondary | ICD-10-CM | POA: Diagnosis not present

## 2017-12-05 DIAGNOSIS — E1122 Type 2 diabetes mellitus with diabetic chronic kidney disease: Secondary | ICD-10-CM | POA: Diagnosis not present

## 2017-12-05 DIAGNOSIS — Z992 Dependence on renal dialysis: Secondary | ICD-10-CM | POA: Diagnosis not present

## 2017-12-05 DIAGNOSIS — N186 End stage renal disease: Secondary | ICD-10-CM | POA: Diagnosis not present

## 2017-12-07 DIAGNOSIS — D509 Iron deficiency anemia, unspecified: Secondary | ICD-10-CM | POA: Diagnosis not present

## 2017-12-07 DIAGNOSIS — N2581 Secondary hyperparathyroidism of renal origin: Secondary | ICD-10-CM | POA: Diagnosis not present

## 2017-12-07 DIAGNOSIS — N186 End stage renal disease: Secondary | ICD-10-CM | POA: Diagnosis not present

## 2017-12-07 DIAGNOSIS — E1122 Type 2 diabetes mellitus with diabetic chronic kidney disease: Secondary | ICD-10-CM | POA: Diagnosis not present

## 2017-12-07 DIAGNOSIS — D631 Anemia in chronic kidney disease: Secondary | ICD-10-CM | POA: Diagnosis not present

## 2017-12-09 DIAGNOSIS — D509 Iron deficiency anemia, unspecified: Secondary | ICD-10-CM | POA: Diagnosis not present

## 2017-12-09 DIAGNOSIS — E1122 Type 2 diabetes mellitus with diabetic chronic kidney disease: Secondary | ICD-10-CM | POA: Diagnosis not present

## 2017-12-09 DIAGNOSIS — D631 Anemia in chronic kidney disease: Secondary | ICD-10-CM | POA: Diagnosis not present

## 2017-12-09 DIAGNOSIS — N186 End stage renal disease: Secondary | ICD-10-CM | POA: Diagnosis not present

## 2017-12-09 DIAGNOSIS — N2581 Secondary hyperparathyroidism of renal origin: Secondary | ICD-10-CM | POA: Diagnosis not present

## 2017-12-12 ENCOUNTER — Other Ambulatory Visit: Payer: Self-pay | Admitting: Licensed Clinical Social Worker

## 2017-12-12 DIAGNOSIS — N186 End stage renal disease: Secondary | ICD-10-CM | POA: Diagnosis not present

## 2017-12-12 DIAGNOSIS — D509 Iron deficiency anemia, unspecified: Secondary | ICD-10-CM | POA: Diagnosis not present

## 2017-12-12 DIAGNOSIS — D631 Anemia in chronic kidney disease: Secondary | ICD-10-CM | POA: Diagnosis not present

## 2017-12-12 DIAGNOSIS — N2581 Secondary hyperparathyroidism of renal origin: Secondary | ICD-10-CM | POA: Diagnosis not present

## 2017-12-12 DIAGNOSIS — E1122 Type 2 diabetes mellitus with diabetic chronic kidney disease: Secondary | ICD-10-CM | POA: Diagnosis not present

## 2017-12-12 NOTE — Patient Outreach (Signed)
[facility]) Care Management  12/12/2017  [name] 06/10/1966 [id number]  Assessment- CSW has been unable to establish contact with patient. CSW has made multiple unsuccessful outreach attempts and mailed outreach barrier letter on 11/24/17 but received no response from patient. CSW will complete case closure at this time.  Plan-CSW will update PCP and [facility] Care Management Assistant of case closure.  [name], BSW, MSW, [email or web address] Phone: [phone] Fax: [phone]

## 2017-12-14 DIAGNOSIS — N186 End stage renal disease: Secondary | ICD-10-CM | POA: Diagnosis not present

## 2017-12-14 DIAGNOSIS — D631 Anemia in chronic kidney disease: Secondary | ICD-10-CM | POA: Diagnosis not present

## 2017-12-14 DIAGNOSIS — D509 Iron deficiency anemia, unspecified: Secondary | ICD-10-CM | POA: Diagnosis not present

## 2017-12-14 DIAGNOSIS — E1122 Type 2 diabetes mellitus with diabetic chronic kidney disease: Secondary | ICD-10-CM | POA: Diagnosis not present

## 2017-12-14 DIAGNOSIS — N2581 Secondary hyperparathyroidism of renal origin: Secondary | ICD-10-CM | POA: Diagnosis not present

## 2017-12-16 DIAGNOSIS — E1122 Type 2 diabetes mellitus with diabetic chronic kidney disease: Secondary | ICD-10-CM | POA: Diagnosis not present

## 2017-12-16 DIAGNOSIS — N186 End stage renal disease: Secondary | ICD-10-CM | POA: Diagnosis not present

## 2017-12-16 DIAGNOSIS — D509 Iron deficiency anemia, unspecified: Secondary | ICD-10-CM | POA: Diagnosis not present

## 2017-12-16 DIAGNOSIS — N2581 Secondary hyperparathyroidism of renal origin: Secondary | ICD-10-CM | POA: Diagnosis not present

## 2017-12-16 DIAGNOSIS — D631 Anemia in chronic kidney disease: Secondary | ICD-10-CM | POA: Diagnosis not present

## 2017-12-19 DIAGNOSIS — D509 Iron deficiency anemia, unspecified: Secondary | ICD-10-CM | POA: Diagnosis not present

## 2017-12-19 DIAGNOSIS — N2581 Secondary hyperparathyroidism of renal origin: Secondary | ICD-10-CM | POA: Diagnosis not present

## 2017-12-19 DIAGNOSIS — D631 Anemia in chronic kidney disease: Secondary | ICD-10-CM | POA: Diagnosis not present

## 2017-12-19 DIAGNOSIS — N186 End stage renal disease: Secondary | ICD-10-CM | POA: Diagnosis not present

## 2017-12-19 DIAGNOSIS — E1122 Type 2 diabetes mellitus with diabetic chronic kidney disease: Secondary | ICD-10-CM | POA: Diagnosis not present

## 2017-12-21 DIAGNOSIS — N2581 Secondary hyperparathyroidism of renal origin: Secondary | ICD-10-CM | POA: Diagnosis not present

## 2017-12-21 DIAGNOSIS — D631 Anemia in chronic kidney disease: Secondary | ICD-10-CM | POA: Diagnosis not present

## 2017-12-21 DIAGNOSIS — E1122 Type 2 diabetes mellitus with diabetic chronic kidney disease: Secondary | ICD-10-CM | POA: Diagnosis not present

## 2017-12-21 DIAGNOSIS — D509 Iron deficiency anemia, unspecified: Secondary | ICD-10-CM | POA: Diagnosis not present

## 2017-12-21 DIAGNOSIS — N186 End stage renal disease: Secondary | ICD-10-CM | POA: Diagnosis not present

## 2017-12-23 DIAGNOSIS — D631 Anemia in chronic kidney disease: Secondary | ICD-10-CM | POA: Diagnosis not present

## 2017-12-23 DIAGNOSIS — E1122 Type 2 diabetes mellitus with diabetic chronic kidney disease: Secondary | ICD-10-CM | POA: Diagnosis not present

## 2017-12-23 DIAGNOSIS — N2581 Secondary hyperparathyroidism of renal origin: Secondary | ICD-10-CM | POA: Diagnosis not present

## 2017-12-23 DIAGNOSIS — N186 End stage renal disease: Secondary | ICD-10-CM | POA: Diagnosis not present

## 2017-12-23 DIAGNOSIS — D509 Iron deficiency anemia, unspecified: Secondary | ICD-10-CM | POA: Diagnosis not present

## 2017-12-26 ENCOUNTER — Encounter: Payer: Self-pay | Admitting: Internal Medicine

## 2017-12-26 DIAGNOSIS — N2581 Secondary hyperparathyroidism of renal origin: Secondary | ICD-10-CM | POA: Diagnosis not present

## 2017-12-26 DIAGNOSIS — E1122 Type 2 diabetes mellitus with diabetic chronic kidney disease: Secondary | ICD-10-CM | POA: Diagnosis not present

## 2017-12-26 DIAGNOSIS — N186 End stage renal disease: Secondary | ICD-10-CM | POA: Diagnosis not present

## 2017-12-26 DIAGNOSIS — D631 Anemia in chronic kidney disease: Secondary | ICD-10-CM | POA: Diagnosis not present

## 2017-12-26 DIAGNOSIS — D509 Iron deficiency anemia, unspecified: Secondary | ICD-10-CM | POA: Diagnosis not present

## 2017-12-28 DIAGNOSIS — D509 Iron deficiency anemia, unspecified: Secondary | ICD-10-CM | POA: Diagnosis not present

## 2017-12-28 DIAGNOSIS — N2581 Secondary hyperparathyroidism of renal origin: Secondary | ICD-10-CM | POA: Diagnosis not present

## 2017-12-28 DIAGNOSIS — N186 End stage renal disease: Secondary | ICD-10-CM | POA: Diagnosis not present

## 2017-12-28 DIAGNOSIS — E1122 Type 2 diabetes mellitus with diabetic chronic kidney disease: Secondary | ICD-10-CM | POA: Diagnosis not present

## 2017-12-28 DIAGNOSIS — D631 Anemia in chronic kidney disease: Secondary | ICD-10-CM | POA: Diagnosis not present

## 2017-12-29 ENCOUNTER — Other Ambulatory Visit: Payer: Self-pay | Admitting: Internal Medicine

## 2017-12-30 DIAGNOSIS — D631 Anemia in chronic kidney disease: Secondary | ICD-10-CM | POA: Diagnosis not present

## 2017-12-30 DIAGNOSIS — E1122 Type 2 diabetes mellitus with diabetic chronic kidney disease: Secondary | ICD-10-CM | POA: Diagnosis not present

## 2017-12-30 DIAGNOSIS — D509 Iron deficiency anemia, unspecified: Secondary | ICD-10-CM | POA: Diagnosis not present

## 2017-12-30 DIAGNOSIS — N2581 Secondary hyperparathyroidism of renal origin: Secondary | ICD-10-CM | POA: Diagnosis not present

## 2017-12-30 DIAGNOSIS — N186 End stage renal disease: Secondary | ICD-10-CM | POA: Diagnosis not present

## 2018-01-02 DIAGNOSIS — N2581 Secondary hyperparathyroidism of renal origin: Secondary | ICD-10-CM | POA: Diagnosis not present

## 2018-01-02 DIAGNOSIS — N186 End stage renal disease: Secondary | ICD-10-CM | POA: Diagnosis not present

## 2018-01-02 DIAGNOSIS — D509 Iron deficiency anemia, unspecified: Secondary | ICD-10-CM | POA: Diagnosis not present

## 2018-01-02 DIAGNOSIS — D631 Anemia in chronic kidney disease: Secondary | ICD-10-CM | POA: Diagnosis not present

## 2018-01-02 DIAGNOSIS — E1122 Type 2 diabetes mellitus with diabetic chronic kidney disease: Secondary | ICD-10-CM | POA: Diagnosis not present

## 2018-01-04 DIAGNOSIS — E1122 Type 2 diabetes mellitus with diabetic chronic kidney disease: Secondary | ICD-10-CM | POA: Diagnosis not present

## 2018-01-04 DIAGNOSIS — D509 Iron deficiency anemia, unspecified: Secondary | ICD-10-CM | POA: Diagnosis not present

## 2018-01-04 DIAGNOSIS — N186 End stage renal disease: Secondary | ICD-10-CM | POA: Diagnosis not present

## 2018-01-04 DIAGNOSIS — D631 Anemia in chronic kidney disease: Secondary | ICD-10-CM | POA: Diagnosis not present

## 2018-01-04 DIAGNOSIS — N2581 Secondary hyperparathyroidism of renal origin: Secondary | ICD-10-CM | POA: Diagnosis not present

## 2018-01-05 DIAGNOSIS — N186 End stage renal disease: Secondary | ICD-10-CM | POA: Diagnosis not present

## 2018-01-05 DIAGNOSIS — E1122 Type 2 diabetes mellitus with diabetic chronic kidney disease: Secondary | ICD-10-CM | POA: Diagnosis not present

## 2018-01-05 DIAGNOSIS — Z992 Dependence on renal dialysis: Secondary | ICD-10-CM | POA: Diagnosis not present

## 2018-01-06 DIAGNOSIS — N186 End stage renal disease: Secondary | ICD-10-CM | POA: Diagnosis not present

## 2018-01-06 DIAGNOSIS — D631 Anemia in chronic kidney disease: Secondary | ICD-10-CM | POA: Diagnosis not present

## 2018-01-06 DIAGNOSIS — E1122 Type 2 diabetes mellitus with diabetic chronic kidney disease: Secondary | ICD-10-CM | POA: Diagnosis not present

## 2018-01-06 DIAGNOSIS — Z23 Encounter for immunization: Secondary | ICD-10-CM | POA: Diagnosis not present

## 2018-01-06 DIAGNOSIS — D509 Iron deficiency anemia, unspecified: Secondary | ICD-10-CM | POA: Diagnosis not present

## 2018-01-06 DIAGNOSIS — N2581 Secondary hyperparathyroidism of renal origin: Secondary | ICD-10-CM | POA: Diagnosis not present

## 2018-01-06 DIAGNOSIS — Z992 Dependence on renal dialysis: Secondary | ICD-10-CM | POA: Diagnosis not present

## 2018-01-09 DIAGNOSIS — E1122 Type 2 diabetes mellitus with diabetic chronic kidney disease: Secondary | ICD-10-CM | POA: Diagnosis not present

## 2018-01-09 DIAGNOSIS — Z23 Encounter for immunization: Secondary | ICD-10-CM | POA: Diagnosis not present

## 2018-01-09 DIAGNOSIS — N186 End stage renal disease: Secondary | ICD-10-CM | POA: Diagnosis not present

## 2018-01-09 DIAGNOSIS — N2581 Secondary hyperparathyroidism of renal origin: Secondary | ICD-10-CM | POA: Diagnosis not present

## 2018-01-09 DIAGNOSIS — D631 Anemia in chronic kidney disease: Secondary | ICD-10-CM | POA: Diagnosis not present

## 2018-01-09 DIAGNOSIS — D509 Iron deficiency anemia, unspecified: Secondary | ICD-10-CM | POA: Diagnosis not present

## 2018-01-11 ENCOUNTER — Other Ambulatory Visit: Payer: Self-pay | Admitting: Internal Medicine

## 2018-01-11 DIAGNOSIS — N186 End stage renal disease: Secondary | ICD-10-CM | POA: Diagnosis not present

## 2018-01-11 DIAGNOSIS — D631 Anemia in chronic kidney disease: Secondary | ICD-10-CM | POA: Diagnosis not present

## 2018-01-11 DIAGNOSIS — E1122 Type 2 diabetes mellitus with diabetic chronic kidney disease: Secondary | ICD-10-CM | POA: Diagnosis not present

## 2018-01-11 DIAGNOSIS — Z23 Encounter for immunization: Secondary | ICD-10-CM | POA: Diagnosis not present

## 2018-01-11 DIAGNOSIS — D509 Iron deficiency anemia, unspecified: Secondary | ICD-10-CM | POA: Diagnosis not present

## 2018-01-11 DIAGNOSIS — N2581 Secondary hyperparathyroidism of renal origin: Secondary | ICD-10-CM | POA: Diagnosis not present

## 2018-01-13 DIAGNOSIS — D509 Iron deficiency anemia, unspecified: Secondary | ICD-10-CM | POA: Diagnosis not present

## 2018-01-13 DIAGNOSIS — N186 End stage renal disease: Secondary | ICD-10-CM | POA: Diagnosis not present

## 2018-01-13 DIAGNOSIS — D631 Anemia in chronic kidney disease: Secondary | ICD-10-CM | POA: Diagnosis not present

## 2018-01-13 DIAGNOSIS — Z23 Encounter for immunization: Secondary | ICD-10-CM | POA: Diagnosis not present

## 2018-01-13 DIAGNOSIS — E1122 Type 2 diabetes mellitus with diabetic chronic kidney disease: Secondary | ICD-10-CM | POA: Diagnosis not present

## 2018-01-13 DIAGNOSIS — N2581 Secondary hyperparathyroidism of renal origin: Secondary | ICD-10-CM | POA: Diagnosis not present

## 2018-01-16 DIAGNOSIS — N186 End stage renal disease: Secondary | ICD-10-CM | POA: Diagnosis not present

## 2018-01-16 DIAGNOSIS — Z23 Encounter for immunization: Secondary | ICD-10-CM | POA: Diagnosis not present

## 2018-01-16 DIAGNOSIS — D631 Anemia in chronic kidney disease: Secondary | ICD-10-CM | POA: Diagnosis not present

## 2018-01-16 DIAGNOSIS — E1122 Type 2 diabetes mellitus with diabetic chronic kidney disease: Secondary | ICD-10-CM | POA: Diagnosis not present

## 2018-01-16 DIAGNOSIS — D509 Iron deficiency anemia, unspecified: Secondary | ICD-10-CM | POA: Diagnosis not present

## 2018-01-16 DIAGNOSIS — N2581 Secondary hyperparathyroidism of renal origin: Secondary | ICD-10-CM | POA: Diagnosis not present

## 2018-01-18 DIAGNOSIS — N186 End stage renal disease: Secondary | ICD-10-CM | POA: Diagnosis not present

## 2018-01-18 DIAGNOSIS — D631 Anemia in chronic kidney disease: Secondary | ICD-10-CM | POA: Diagnosis not present

## 2018-01-18 DIAGNOSIS — N2581 Secondary hyperparathyroidism of renal origin: Secondary | ICD-10-CM | POA: Diagnosis not present

## 2018-01-18 DIAGNOSIS — D509 Iron deficiency anemia, unspecified: Secondary | ICD-10-CM | POA: Diagnosis not present

## 2018-01-18 DIAGNOSIS — E1122 Type 2 diabetes mellitus with diabetic chronic kidney disease: Secondary | ICD-10-CM | POA: Diagnosis not present

## 2018-01-18 DIAGNOSIS — Z23 Encounter for immunization: Secondary | ICD-10-CM | POA: Diagnosis not present

## 2018-01-19 DIAGNOSIS — H35371 Puckering of macula, right eye: Secondary | ICD-10-CM | POA: Diagnosis not present

## 2018-01-19 DIAGNOSIS — E113593 Type 2 diabetes mellitus with proliferative diabetic retinopathy without macular edema, bilateral: Secondary | ICD-10-CM | POA: Diagnosis not present

## 2018-01-20 DIAGNOSIS — N186 End stage renal disease: Secondary | ICD-10-CM | POA: Diagnosis not present

## 2018-01-20 DIAGNOSIS — N2581 Secondary hyperparathyroidism of renal origin: Secondary | ICD-10-CM | POA: Diagnosis not present

## 2018-01-20 DIAGNOSIS — D631 Anemia in chronic kidney disease: Secondary | ICD-10-CM | POA: Diagnosis not present

## 2018-01-20 DIAGNOSIS — E1122 Type 2 diabetes mellitus with diabetic chronic kidney disease: Secondary | ICD-10-CM | POA: Diagnosis not present

## 2018-01-20 DIAGNOSIS — Z23 Encounter for immunization: Secondary | ICD-10-CM | POA: Diagnosis not present

## 2018-01-20 DIAGNOSIS — D509 Iron deficiency anemia, unspecified: Secondary | ICD-10-CM | POA: Diagnosis not present

## 2018-01-23 DIAGNOSIS — D509 Iron deficiency anemia, unspecified: Secondary | ICD-10-CM | POA: Diagnosis not present

## 2018-01-23 DIAGNOSIS — N2581 Secondary hyperparathyroidism of renal origin: Secondary | ICD-10-CM | POA: Diagnosis not present

## 2018-01-23 DIAGNOSIS — E1122 Type 2 diabetes mellitus with diabetic chronic kidney disease: Secondary | ICD-10-CM | POA: Diagnosis not present

## 2018-01-23 DIAGNOSIS — N186 End stage renal disease: Secondary | ICD-10-CM | POA: Diagnosis not present

## 2018-01-23 DIAGNOSIS — Z23 Encounter for immunization: Secondary | ICD-10-CM | POA: Diagnosis not present

## 2018-01-23 DIAGNOSIS — D631 Anemia in chronic kidney disease: Secondary | ICD-10-CM | POA: Diagnosis not present

## 2018-01-25 DIAGNOSIS — D509 Iron deficiency anemia, unspecified: Secondary | ICD-10-CM | POA: Diagnosis not present

## 2018-01-25 DIAGNOSIS — N186 End stage renal disease: Secondary | ICD-10-CM | POA: Diagnosis not present

## 2018-01-25 DIAGNOSIS — D631 Anemia in chronic kidney disease: Secondary | ICD-10-CM | POA: Diagnosis not present

## 2018-01-25 DIAGNOSIS — Z23 Encounter for immunization: Secondary | ICD-10-CM | POA: Diagnosis not present

## 2018-01-25 DIAGNOSIS — N2581 Secondary hyperparathyroidism of renal origin: Secondary | ICD-10-CM | POA: Diagnosis not present

## 2018-01-25 DIAGNOSIS — E1122 Type 2 diabetes mellitus with diabetic chronic kidney disease: Secondary | ICD-10-CM | POA: Diagnosis not present

## 2018-01-26 ENCOUNTER — Encounter: Payer: Self-pay | Admitting: Internal Medicine

## 2018-01-26 ENCOUNTER — Other Ambulatory Visit: Payer: Self-pay

## 2018-01-26 ENCOUNTER — Ambulatory Visit (INDEPENDENT_AMBULATORY_CARE_PROVIDER_SITE_OTHER): Payer: Medicare Other | Admitting: Internal Medicine

## 2018-01-26 VITALS — BP 110/60 | HR 84 | Temp 98.6°F | Ht 67.0 in | Wt 205.0 lb

## 2018-01-26 DIAGNOSIS — E1139 Type 2 diabetes mellitus with other diabetic ophthalmic complication: Secondary | ICD-10-CM

## 2018-01-26 DIAGNOSIS — I10 Essential (primary) hypertension: Secondary | ICD-10-CM

## 2018-01-26 DIAGNOSIS — Z1239 Encounter for other screening for malignant neoplasm of breast: Secondary | ICD-10-CM

## 2018-01-26 DIAGNOSIS — Z1231 Encounter for screening mammogram for malignant neoplasm of breast: Secondary | ICD-10-CM | POA: Diagnosis not present

## 2018-01-26 DIAGNOSIS — Z794 Long term (current) use of insulin: Secondary | ICD-10-CM | POA: Diagnosis not present

## 2018-01-26 LAB — POCT GLYCOSYLATED HEMOGLOBIN (HGB A1C): Hemoglobin A1C: 6.9

## 2018-01-26 MED ORDER — GLUCOSE BLOOD VI STRP: ORAL_STRIP | 12 refills | Status: DC

## 2018-01-26 MED ORDER — ONETOUCH DELICA LANCETS 33G MISC: 0 refills | Status: DC

## 2018-01-26 MED ORDER — ONETOUCH DELICA LANCING DEV MISC: 0 refills | Status: DC

## 2018-01-26 MED ORDER — ONETOUCH VERIO W/DEVICE KIT: 1.0000 | PACK | Freq: Two times a day (BID) | 0 refills | Status: DC

## 2018-01-26 NOTE — Progress Notes (Signed)
   [facility] Phone: [phone]  Subjective:  [name] is a 52 year old female presenting to clinic for follow-up of her diabetes and HTN.  T2DM: Checking blood sugars twice a day. They have been ranging from 140-160. The highest she has seen is 225. She feels like her blood sugar gets low a couple times per week, but she never checks her blood sugar when she thinks it's low. No polyuria, no polydipsia.  HTN: Has her blood pressure checked at HD and has always been told that it is good. She has recently cut out bologna from her diet to see if that will help her blood pressure. No dizziness or lightheadedness, even after HD. No chest pain, no SOB. Takes Norvasc 10mg  daily, Lopressor 50mg  bid, and Losartan 50mg  daily. No side effects.  ROS: See HPI for pertinent positives and negatives  Past Medical History- HTN, T2DM, ESRD on HD, anemia, HLD, legally blind, s/p BKA right  Family history reviewed for today's visit. No changes.  Social history- patient is a never smoker  Objective: BP 110/60   Pulse 84   Temp 98.6 F (37 C) (Oral)   Ht 5\' 7"  (1.702 m)   Wt 205 lb (93 kg)   SpO2 98%   BMI 32.11 kg/m  Gen: NAD, alert, cooperative with exam HEENT: NCAT, MMM Neck: FROM, supple CV: RRR, no murmur Resp: CTABL, no wheezes, normal work of breathing Msk: right BKA  Assessment/Plan: T2DM: Well-controlled. A1c 6.9% today.  - Continue Humulin 70/30 8 units bid - Check blood sugars twice a day or anytime she thinks her sugar might be low - Patient will call if she is having frequent low blood sugars - New blood sugar testing supplies ordered - Follow-up in 3 months  HTN: Well-controlled. BP 110/60 in clinic today. Much improved after cutting out bologna.  - Continue Norvasc 10mg  daily, Lopressor 50mg  bid, and Losartan 50mg  daily - Follow-up in 3 months  Health Care Maintenance: - Order placed for mammogram   Hyman Bible, MD PGY-3

## 2018-01-26 NOTE — Patient Instructions (Signed)
It was so nice to see you!  Let's keep your diabetes and blood pressure medications the same for now.  If you feel like your blood sugar is low, please check it. If you are having low blood sugars a few times per week, we will need to adjust your insulin.  Please call me if your blood sugars are low. Otherwise, we will see you back in 3 months!  -Dr. Brett Albino

## 2018-01-27 DIAGNOSIS — N186 End stage renal disease: Secondary | ICD-10-CM | POA: Diagnosis not present

## 2018-01-27 DIAGNOSIS — D509 Iron deficiency anemia, unspecified: Secondary | ICD-10-CM | POA: Diagnosis not present

## 2018-01-27 DIAGNOSIS — Z23 Encounter for immunization: Secondary | ICD-10-CM | POA: Diagnosis not present

## 2018-01-27 DIAGNOSIS — E1122 Type 2 diabetes mellitus with diabetic chronic kidney disease: Secondary | ICD-10-CM | POA: Diagnosis not present

## 2018-01-27 DIAGNOSIS — N2581 Secondary hyperparathyroidism of renal origin: Secondary | ICD-10-CM | POA: Diagnosis not present

## 2018-01-27 DIAGNOSIS — D631 Anemia in chronic kidney disease: Secondary | ICD-10-CM | POA: Diagnosis not present

## 2018-01-27 NOTE — Assessment & Plan Note (Addendum)
Well-controlled. A1c 6.9% today.  - Continue Humulin 70/30 8 units bid - Check blood sugars twice a day or anytime she thinks her sugar might be low - Patient will call if she is having frequent low blood sugars - New blood sugar testing supplies ordered - Follow-up in 3 months

## 2018-01-27 NOTE — Assessment & Plan Note (Signed)
Well-controlled. BP 110/60 in clinic today. Much improved after cutting out bologna.  - Continue Norvasc 10mg  daily, Lopressor 50mg  bid, and Losartan 50mg  daily - Follow-up in 3 months

## 2018-01-30 DIAGNOSIS — N186 End stage renal disease: Secondary | ICD-10-CM | POA: Diagnosis not present

## 2018-01-30 DIAGNOSIS — E1122 Type 2 diabetes mellitus with diabetic chronic kidney disease: Secondary | ICD-10-CM | POA: Diagnosis not present

## 2018-01-30 DIAGNOSIS — D509 Iron deficiency anemia, unspecified: Secondary | ICD-10-CM | POA: Diagnosis not present

## 2018-01-30 DIAGNOSIS — D631 Anemia in chronic kidney disease: Secondary | ICD-10-CM | POA: Diagnosis not present

## 2018-01-30 DIAGNOSIS — Z23 Encounter for immunization: Secondary | ICD-10-CM | POA: Diagnosis not present

## 2018-01-30 DIAGNOSIS — N2581 Secondary hyperparathyroidism of renal origin: Secondary | ICD-10-CM | POA: Diagnosis not present

## 2018-02-01 DIAGNOSIS — N186 End stage renal disease: Secondary | ICD-10-CM | POA: Diagnosis not present

## 2018-02-01 DIAGNOSIS — E1122 Type 2 diabetes mellitus with diabetic chronic kidney disease: Secondary | ICD-10-CM | POA: Diagnosis not present

## 2018-02-01 DIAGNOSIS — Z23 Encounter for immunization: Secondary | ICD-10-CM | POA: Diagnosis not present

## 2018-02-01 DIAGNOSIS — N2581 Secondary hyperparathyroidism of renal origin: Secondary | ICD-10-CM | POA: Diagnosis not present

## 2018-02-01 DIAGNOSIS — D509 Iron deficiency anemia, unspecified: Secondary | ICD-10-CM | POA: Diagnosis not present

## 2018-02-01 DIAGNOSIS — D631 Anemia in chronic kidney disease: Secondary | ICD-10-CM | POA: Diagnosis not present

## 2018-02-03 DIAGNOSIS — D509 Iron deficiency anemia, unspecified: Secondary | ICD-10-CM | POA: Diagnosis not present

## 2018-02-03 DIAGNOSIS — Z992 Dependence on renal dialysis: Secondary | ICD-10-CM | POA: Diagnosis not present

## 2018-02-03 DIAGNOSIS — D631 Anemia in chronic kidney disease: Secondary | ICD-10-CM | POA: Diagnosis not present

## 2018-02-03 DIAGNOSIS — N186 End stage renal disease: Secondary | ICD-10-CM | POA: Diagnosis not present

## 2018-02-03 DIAGNOSIS — R768 Other specified abnormal immunological findings in serum: Secondary | ICD-10-CM | POA: Diagnosis not present

## 2018-02-03 DIAGNOSIS — E1122 Type 2 diabetes mellitus with diabetic chronic kidney disease: Secondary | ICD-10-CM | POA: Diagnosis not present

## 2018-02-03 DIAGNOSIS — N2581 Secondary hyperparathyroidism of renal origin: Secondary | ICD-10-CM | POA: Diagnosis not present

## 2018-02-06 DIAGNOSIS — N186 End stage renal disease: Secondary | ICD-10-CM | POA: Diagnosis not present

## 2018-02-06 DIAGNOSIS — E1122 Type 2 diabetes mellitus with diabetic chronic kidney disease: Secondary | ICD-10-CM | POA: Diagnosis not present

## 2018-02-06 DIAGNOSIS — D509 Iron deficiency anemia, unspecified: Secondary | ICD-10-CM | POA: Diagnosis not present

## 2018-02-06 DIAGNOSIS — N2581 Secondary hyperparathyroidism of renal origin: Secondary | ICD-10-CM | POA: Diagnosis not present

## 2018-02-06 DIAGNOSIS — D631 Anemia in chronic kidney disease: Secondary | ICD-10-CM | POA: Diagnosis not present

## 2018-02-06 DIAGNOSIS — R768 Other specified abnormal immunological findings in serum: Secondary | ICD-10-CM | POA: Diagnosis not present

## 2018-02-08 ENCOUNTER — Other Ambulatory Visit: Payer: Self-pay | Admitting: Internal Medicine

## 2018-02-08 DIAGNOSIS — R768 Other specified abnormal immunological findings in serum: Secondary | ICD-10-CM | POA: Diagnosis not present

## 2018-02-08 DIAGNOSIS — D509 Iron deficiency anemia, unspecified: Secondary | ICD-10-CM | POA: Diagnosis not present

## 2018-02-08 DIAGNOSIS — N2581 Secondary hyperparathyroidism of renal origin: Secondary | ICD-10-CM | POA: Diagnosis not present

## 2018-02-08 DIAGNOSIS — E1122 Type 2 diabetes mellitus with diabetic chronic kidney disease: Secondary | ICD-10-CM | POA: Diagnosis not present

## 2018-02-08 DIAGNOSIS — D631 Anemia in chronic kidney disease: Secondary | ICD-10-CM | POA: Diagnosis not present

## 2018-02-08 DIAGNOSIS — N186 End stage renal disease: Secondary | ICD-10-CM | POA: Diagnosis not present

## 2018-02-10 DIAGNOSIS — R768 Other specified abnormal immunological findings in serum: Secondary | ICD-10-CM | POA: Diagnosis not present

## 2018-02-10 DIAGNOSIS — N186 End stage renal disease: Secondary | ICD-10-CM | POA: Diagnosis not present

## 2018-02-10 DIAGNOSIS — E1122 Type 2 diabetes mellitus with diabetic chronic kidney disease: Secondary | ICD-10-CM | POA: Diagnosis not present

## 2018-02-10 DIAGNOSIS — D631 Anemia in chronic kidney disease: Secondary | ICD-10-CM | POA: Diagnosis not present

## 2018-02-10 DIAGNOSIS — N2581 Secondary hyperparathyroidism of renal origin: Secondary | ICD-10-CM | POA: Diagnosis not present

## 2018-02-10 DIAGNOSIS — D509 Iron deficiency anemia, unspecified: Secondary | ICD-10-CM | POA: Diagnosis not present

## 2018-02-13 DIAGNOSIS — R768 Other specified abnormal immunological findings in serum: Secondary | ICD-10-CM | POA: Diagnosis not present

## 2018-02-13 DIAGNOSIS — E1122 Type 2 diabetes mellitus with diabetic chronic kidney disease: Secondary | ICD-10-CM | POA: Diagnosis not present

## 2018-02-13 DIAGNOSIS — D631 Anemia in chronic kidney disease: Secondary | ICD-10-CM | POA: Diagnosis not present

## 2018-02-13 DIAGNOSIS — N186 End stage renal disease: Secondary | ICD-10-CM | POA: Diagnosis not present

## 2018-02-13 DIAGNOSIS — D509 Iron deficiency anemia, unspecified: Secondary | ICD-10-CM | POA: Diagnosis not present

## 2018-02-13 DIAGNOSIS — N2581 Secondary hyperparathyroidism of renal origin: Secondary | ICD-10-CM | POA: Diagnosis not present

## 2018-02-15 DIAGNOSIS — R768 Other specified abnormal immunological findings in serum: Secondary | ICD-10-CM | POA: Diagnosis not present

## 2018-02-15 DIAGNOSIS — D509 Iron deficiency anemia, unspecified: Secondary | ICD-10-CM | POA: Diagnosis not present

## 2018-02-15 DIAGNOSIS — D631 Anemia in chronic kidney disease: Secondary | ICD-10-CM | POA: Diagnosis not present

## 2018-02-15 DIAGNOSIS — N2581 Secondary hyperparathyroidism of renal origin: Secondary | ICD-10-CM | POA: Diagnosis not present

## 2018-02-15 DIAGNOSIS — E1122 Type 2 diabetes mellitus with diabetic chronic kidney disease: Secondary | ICD-10-CM | POA: Diagnosis not present

## 2018-02-15 DIAGNOSIS — N186 End stage renal disease: Secondary | ICD-10-CM | POA: Diagnosis not present

## 2018-02-17 DIAGNOSIS — D509 Iron deficiency anemia, unspecified: Secondary | ICD-10-CM | POA: Diagnosis not present

## 2018-02-17 DIAGNOSIS — E1122 Type 2 diabetes mellitus with diabetic chronic kidney disease: Secondary | ICD-10-CM | POA: Diagnosis not present

## 2018-02-17 DIAGNOSIS — N186 End stage renal disease: Secondary | ICD-10-CM | POA: Diagnosis not present

## 2018-02-17 DIAGNOSIS — R768 Other specified abnormal immunological findings in serum: Secondary | ICD-10-CM | POA: Diagnosis not present

## 2018-02-17 DIAGNOSIS — N2581 Secondary hyperparathyroidism of renal origin: Secondary | ICD-10-CM | POA: Diagnosis not present

## 2018-02-17 DIAGNOSIS — D631 Anemia in chronic kidney disease: Secondary | ICD-10-CM | POA: Diagnosis not present

## 2018-02-20 DIAGNOSIS — N2581 Secondary hyperparathyroidism of renal origin: Secondary | ICD-10-CM | POA: Diagnosis not present

## 2018-02-20 DIAGNOSIS — D509 Iron deficiency anemia, unspecified: Secondary | ICD-10-CM | POA: Diagnosis not present

## 2018-02-20 DIAGNOSIS — R768 Other specified abnormal immunological findings in serum: Secondary | ICD-10-CM | POA: Diagnosis not present

## 2018-02-20 DIAGNOSIS — D631 Anemia in chronic kidney disease: Secondary | ICD-10-CM | POA: Diagnosis not present

## 2018-02-20 DIAGNOSIS — E1122 Type 2 diabetes mellitus with diabetic chronic kidney disease: Secondary | ICD-10-CM | POA: Diagnosis not present

## 2018-02-20 DIAGNOSIS — N186 End stage renal disease: Secondary | ICD-10-CM | POA: Diagnosis not present

## 2018-02-22 DIAGNOSIS — D631 Anemia in chronic kidney disease: Secondary | ICD-10-CM | POA: Diagnosis not present

## 2018-02-22 DIAGNOSIS — N186 End stage renal disease: Secondary | ICD-10-CM | POA: Diagnosis not present

## 2018-02-22 DIAGNOSIS — N2581 Secondary hyperparathyroidism of renal origin: Secondary | ICD-10-CM | POA: Diagnosis not present

## 2018-02-22 DIAGNOSIS — D509 Iron deficiency anemia, unspecified: Secondary | ICD-10-CM | POA: Diagnosis not present

## 2018-02-22 DIAGNOSIS — R768 Other specified abnormal immunological findings in serum: Secondary | ICD-10-CM | POA: Diagnosis not present

## 2018-02-22 DIAGNOSIS — E1122 Type 2 diabetes mellitus with diabetic chronic kidney disease: Secondary | ICD-10-CM | POA: Diagnosis not present

## 2018-02-24 DIAGNOSIS — N186 End stage renal disease: Secondary | ICD-10-CM | POA: Diagnosis not present

## 2018-02-24 DIAGNOSIS — R768 Other specified abnormal immunological findings in serum: Secondary | ICD-10-CM | POA: Diagnosis not present

## 2018-02-24 DIAGNOSIS — E1122 Type 2 diabetes mellitus with diabetic chronic kidney disease: Secondary | ICD-10-CM | POA: Diagnosis not present

## 2018-02-24 DIAGNOSIS — D509 Iron deficiency anemia, unspecified: Secondary | ICD-10-CM | POA: Diagnosis not present

## 2018-02-24 DIAGNOSIS — D631 Anemia in chronic kidney disease: Secondary | ICD-10-CM | POA: Diagnosis not present

## 2018-02-24 DIAGNOSIS — N2581 Secondary hyperparathyroidism of renal origin: Secondary | ICD-10-CM | POA: Diagnosis not present

## 2018-02-27 DIAGNOSIS — N186 End stage renal disease: Secondary | ICD-10-CM | POA: Diagnosis not present

## 2018-02-27 DIAGNOSIS — D509 Iron deficiency anemia, unspecified: Secondary | ICD-10-CM | POA: Diagnosis not present

## 2018-02-27 DIAGNOSIS — R768 Other specified abnormal immunological findings in serum: Secondary | ICD-10-CM | POA: Diagnosis not present

## 2018-02-27 DIAGNOSIS — D631 Anemia in chronic kidney disease: Secondary | ICD-10-CM | POA: Diagnosis not present

## 2018-02-27 DIAGNOSIS — N2581 Secondary hyperparathyroidism of renal origin: Secondary | ICD-10-CM | POA: Diagnosis not present

## 2018-02-27 DIAGNOSIS — E1122 Type 2 diabetes mellitus with diabetic chronic kidney disease: Secondary | ICD-10-CM | POA: Diagnosis not present

## 2018-03-01 DIAGNOSIS — D509 Iron deficiency anemia, unspecified: Secondary | ICD-10-CM | POA: Diagnosis not present

## 2018-03-01 DIAGNOSIS — R768 Other specified abnormal immunological findings in serum: Secondary | ICD-10-CM | POA: Diagnosis not present

## 2018-03-01 DIAGNOSIS — N2581 Secondary hyperparathyroidism of renal origin: Secondary | ICD-10-CM | POA: Diagnosis not present

## 2018-03-01 DIAGNOSIS — E1122 Type 2 diabetes mellitus with diabetic chronic kidney disease: Secondary | ICD-10-CM | POA: Diagnosis not present

## 2018-03-01 DIAGNOSIS — N186 End stage renal disease: Secondary | ICD-10-CM | POA: Diagnosis not present

## 2018-03-01 DIAGNOSIS — D631 Anemia in chronic kidney disease: Secondary | ICD-10-CM | POA: Diagnosis not present

## 2018-03-03 DIAGNOSIS — E1122 Type 2 diabetes mellitus with diabetic chronic kidney disease: Secondary | ICD-10-CM | POA: Diagnosis not present

## 2018-03-03 DIAGNOSIS — D509 Iron deficiency anemia, unspecified: Secondary | ICD-10-CM | POA: Diagnosis not present

## 2018-03-03 DIAGNOSIS — N186 End stage renal disease: Secondary | ICD-10-CM | POA: Diagnosis not present

## 2018-03-03 DIAGNOSIS — R768 Other specified abnormal immunological findings in serum: Secondary | ICD-10-CM | POA: Diagnosis not present

## 2018-03-03 DIAGNOSIS — N2581 Secondary hyperparathyroidism of renal origin: Secondary | ICD-10-CM | POA: Diagnosis not present

## 2018-03-03 DIAGNOSIS — D631 Anemia in chronic kidney disease: Secondary | ICD-10-CM | POA: Diagnosis not present

## 2018-03-06 DIAGNOSIS — N186 End stage renal disease: Secondary | ICD-10-CM | POA: Diagnosis not present

## 2018-03-06 DIAGNOSIS — E1122 Type 2 diabetes mellitus with diabetic chronic kidney disease: Secondary | ICD-10-CM | POA: Diagnosis not present

## 2018-03-06 DIAGNOSIS — N2581 Secondary hyperparathyroidism of renal origin: Secondary | ICD-10-CM | POA: Diagnosis not present

## 2018-03-06 DIAGNOSIS — D509 Iron deficiency anemia, unspecified: Secondary | ICD-10-CM | POA: Diagnosis not present

## 2018-03-06 DIAGNOSIS — Z992 Dependence on renal dialysis: Secondary | ICD-10-CM | POA: Diagnosis not present

## 2018-03-08 DIAGNOSIS — N186 End stage renal disease: Secondary | ICD-10-CM | POA: Diagnosis not present

## 2018-03-08 DIAGNOSIS — D509 Iron deficiency anemia, unspecified: Secondary | ICD-10-CM | POA: Diagnosis not present

## 2018-03-08 DIAGNOSIS — N2581 Secondary hyperparathyroidism of renal origin: Secondary | ICD-10-CM | POA: Diagnosis not present

## 2018-03-08 DIAGNOSIS — E1122 Type 2 diabetes mellitus with diabetic chronic kidney disease: Secondary | ICD-10-CM | POA: Diagnosis not present

## 2018-03-10 DIAGNOSIS — N2581 Secondary hyperparathyroidism of renal origin: Secondary | ICD-10-CM | POA: Diagnosis not present

## 2018-03-10 DIAGNOSIS — N186 End stage renal disease: Secondary | ICD-10-CM | POA: Diagnosis not present

## 2018-03-10 DIAGNOSIS — D509 Iron deficiency anemia, unspecified: Secondary | ICD-10-CM | POA: Diagnosis not present

## 2018-03-10 DIAGNOSIS — E1122 Type 2 diabetes mellitus with diabetic chronic kidney disease: Secondary | ICD-10-CM | POA: Diagnosis not present

## 2018-03-13 DIAGNOSIS — N2581 Secondary hyperparathyroidism of renal origin: Secondary | ICD-10-CM | POA: Diagnosis not present

## 2018-03-13 DIAGNOSIS — E1122 Type 2 diabetes mellitus with diabetic chronic kidney disease: Secondary | ICD-10-CM | POA: Diagnosis not present

## 2018-03-13 DIAGNOSIS — N186 End stage renal disease: Secondary | ICD-10-CM | POA: Diagnosis not present

## 2018-03-13 DIAGNOSIS — D509 Iron deficiency anemia, unspecified: Secondary | ICD-10-CM | POA: Diagnosis not present

## 2018-03-15 DIAGNOSIS — D509 Iron deficiency anemia, unspecified: Secondary | ICD-10-CM | POA: Diagnosis not present

## 2018-03-15 DIAGNOSIS — E1122 Type 2 diabetes mellitus with diabetic chronic kidney disease: Secondary | ICD-10-CM | POA: Diagnosis not present

## 2018-03-15 DIAGNOSIS — N2581 Secondary hyperparathyroidism of renal origin: Secondary | ICD-10-CM | POA: Diagnosis not present

## 2018-03-15 DIAGNOSIS — N186 End stage renal disease: Secondary | ICD-10-CM | POA: Diagnosis not present

## 2018-03-17 DIAGNOSIS — D509 Iron deficiency anemia, unspecified: Secondary | ICD-10-CM | POA: Diagnosis not present

## 2018-03-17 DIAGNOSIS — N2581 Secondary hyperparathyroidism of renal origin: Secondary | ICD-10-CM | POA: Diagnosis not present

## 2018-03-17 DIAGNOSIS — N186 End stage renal disease: Secondary | ICD-10-CM | POA: Diagnosis not present

## 2018-03-17 DIAGNOSIS — E1122 Type 2 diabetes mellitus with diabetic chronic kidney disease: Secondary | ICD-10-CM | POA: Diagnosis not present

## 2018-03-20 DIAGNOSIS — N2581 Secondary hyperparathyroidism of renal origin: Secondary | ICD-10-CM | POA: Diagnosis not present

## 2018-03-20 DIAGNOSIS — D509 Iron deficiency anemia, unspecified: Secondary | ICD-10-CM | POA: Diagnosis not present

## 2018-03-20 DIAGNOSIS — N186 End stage renal disease: Secondary | ICD-10-CM | POA: Diagnosis not present

## 2018-03-20 DIAGNOSIS — E1122 Type 2 diabetes mellitus with diabetic chronic kidney disease: Secondary | ICD-10-CM | POA: Diagnosis not present

## 2018-03-22 DIAGNOSIS — N2581 Secondary hyperparathyroidism of renal origin: Secondary | ICD-10-CM | POA: Diagnosis not present

## 2018-03-22 DIAGNOSIS — E1122 Type 2 diabetes mellitus with diabetic chronic kidney disease: Secondary | ICD-10-CM | POA: Diagnosis not present

## 2018-03-22 DIAGNOSIS — N186 End stage renal disease: Secondary | ICD-10-CM | POA: Diagnosis not present

## 2018-03-22 DIAGNOSIS — D509 Iron deficiency anemia, unspecified: Secondary | ICD-10-CM | POA: Diagnosis not present

## 2018-03-24 DIAGNOSIS — E1122 Type 2 diabetes mellitus with diabetic chronic kidney disease: Secondary | ICD-10-CM | POA: Diagnosis not present

## 2018-03-24 DIAGNOSIS — N2581 Secondary hyperparathyroidism of renal origin: Secondary | ICD-10-CM | POA: Diagnosis not present

## 2018-03-24 DIAGNOSIS — N186 End stage renal disease: Secondary | ICD-10-CM | POA: Diagnosis not present

## 2018-03-24 DIAGNOSIS — D509 Iron deficiency anemia, unspecified: Secondary | ICD-10-CM | POA: Diagnosis not present

## 2018-03-27 DIAGNOSIS — N2581 Secondary hyperparathyroidism of renal origin: Secondary | ICD-10-CM | POA: Diagnosis not present

## 2018-03-27 DIAGNOSIS — D509 Iron deficiency anemia, unspecified: Secondary | ICD-10-CM | POA: Diagnosis not present

## 2018-03-27 DIAGNOSIS — E1122 Type 2 diabetes mellitus with diabetic chronic kidney disease: Secondary | ICD-10-CM | POA: Diagnosis not present

## 2018-03-27 DIAGNOSIS — N186 End stage renal disease: Secondary | ICD-10-CM | POA: Diagnosis not present

## 2018-03-29 DIAGNOSIS — E1122 Type 2 diabetes mellitus with diabetic chronic kidney disease: Secondary | ICD-10-CM | POA: Diagnosis not present

## 2018-03-29 DIAGNOSIS — D509 Iron deficiency anemia, unspecified: Secondary | ICD-10-CM | POA: Diagnosis not present

## 2018-03-29 DIAGNOSIS — N2581 Secondary hyperparathyroidism of renal origin: Secondary | ICD-10-CM | POA: Diagnosis not present

## 2018-03-29 DIAGNOSIS — N186 End stage renal disease: Secondary | ICD-10-CM | POA: Diagnosis not present

## 2018-03-31 DIAGNOSIS — N2581 Secondary hyperparathyroidism of renal origin: Secondary | ICD-10-CM | POA: Diagnosis not present

## 2018-03-31 DIAGNOSIS — D509 Iron deficiency anemia, unspecified: Secondary | ICD-10-CM | POA: Diagnosis not present

## 2018-03-31 DIAGNOSIS — N186 End stage renal disease: Secondary | ICD-10-CM | POA: Diagnosis not present

## 2018-03-31 DIAGNOSIS — E1122 Type 2 diabetes mellitus with diabetic chronic kidney disease: Secondary | ICD-10-CM | POA: Diagnosis not present

## 2018-04-03 DIAGNOSIS — N2581 Secondary hyperparathyroidism of renal origin: Secondary | ICD-10-CM | POA: Diagnosis not present

## 2018-04-03 DIAGNOSIS — E1122 Type 2 diabetes mellitus with diabetic chronic kidney disease: Secondary | ICD-10-CM | POA: Diagnosis not present

## 2018-04-03 DIAGNOSIS — D509 Iron deficiency anemia, unspecified: Secondary | ICD-10-CM | POA: Diagnosis not present

## 2018-04-03 DIAGNOSIS — N186 End stage renal disease: Secondary | ICD-10-CM | POA: Diagnosis not present

## 2018-04-05 DIAGNOSIS — N2581 Secondary hyperparathyroidism of renal origin: Secondary | ICD-10-CM | POA: Diagnosis not present

## 2018-04-05 DIAGNOSIS — Z992 Dependence on renal dialysis: Secondary | ICD-10-CM | POA: Diagnosis not present

## 2018-04-05 DIAGNOSIS — E1122 Type 2 diabetes mellitus with diabetic chronic kidney disease: Secondary | ICD-10-CM | POA: Diagnosis not present

## 2018-04-05 DIAGNOSIS — N186 End stage renal disease: Secondary | ICD-10-CM | POA: Diagnosis not present

## 2018-04-05 DIAGNOSIS — D631 Anemia in chronic kidney disease: Secondary | ICD-10-CM | POA: Diagnosis not present

## 2018-04-07 DIAGNOSIS — N2581 Secondary hyperparathyroidism of renal origin: Secondary | ICD-10-CM | POA: Diagnosis not present

## 2018-04-07 DIAGNOSIS — D631 Anemia in chronic kidney disease: Secondary | ICD-10-CM | POA: Diagnosis not present

## 2018-04-07 DIAGNOSIS — E1122 Type 2 diabetes mellitus with diabetic chronic kidney disease: Secondary | ICD-10-CM | POA: Diagnosis not present

## 2018-04-07 DIAGNOSIS — N186 End stage renal disease: Secondary | ICD-10-CM | POA: Diagnosis not present

## 2018-04-10 DIAGNOSIS — N2581 Secondary hyperparathyroidism of renal origin: Secondary | ICD-10-CM | POA: Diagnosis not present

## 2018-04-10 DIAGNOSIS — D631 Anemia in chronic kidney disease: Secondary | ICD-10-CM | POA: Diagnosis not present

## 2018-04-10 DIAGNOSIS — N186 End stage renal disease: Secondary | ICD-10-CM | POA: Diagnosis not present

## 2018-04-10 DIAGNOSIS — E1122 Type 2 diabetes mellitus with diabetic chronic kidney disease: Secondary | ICD-10-CM | POA: Diagnosis not present

## 2018-04-12 DIAGNOSIS — D631 Anemia in chronic kidney disease: Secondary | ICD-10-CM | POA: Diagnosis not present

## 2018-04-12 DIAGNOSIS — N2581 Secondary hyperparathyroidism of renal origin: Secondary | ICD-10-CM | POA: Diagnosis not present

## 2018-04-12 DIAGNOSIS — E1122 Type 2 diabetes mellitus with diabetic chronic kidney disease: Secondary | ICD-10-CM | POA: Diagnosis not present

## 2018-04-12 DIAGNOSIS — N186 End stage renal disease: Secondary | ICD-10-CM | POA: Diagnosis not present

## 2018-04-14 DIAGNOSIS — E1122 Type 2 diabetes mellitus with diabetic chronic kidney disease: Secondary | ICD-10-CM | POA: Diagnosis not present

## 2018-04-14 DIAGNOSIS — D631 Anemia in chronic kidney disease: Secondary | ICD-10-CM | POA: Diagnosis not present

## 2018-04-14 DIAGNOSIS — N186 End stage renal disease: Secondary | ICD-10-CM | POA: Diagnosis not present

## 2018-04-14 DIAGNOSIS — N2581 Secondary hyperparathyroidism of renal origin: Secondary | ICD-10-CM | POA: Diagnosis not present

## 2018-04-17 DIAGNOSIS — D631 Anemia in chronic kidney disease: Secondary | ICD-10-CM | POA: Diagnosis not present

## 2018-04-17 DIAGNOSIS — N186 End stage renal disease: Secondary | ICD-10-CM | POA: Diagnosis not present

## 2018-04-17 DIAGNOSIS — E1122 Type 2 diabetes mellitus with diabetic chronic kidney disease: Secondary | ICD-10-CM | POA: Diagnosis not present

## 2018-04-17 DIAGNOSIS — N2581 Secondary hyperparathyroidism of renal origin: Secondary | ICD-10-CM | POA: Diagnosis not present

## 2018-04-19 DIAGNOSIS — N186 End stage renal disease: Secondary | ICD-10-CM | POA: Diagnosis not present

## 2018-04-19 DIAGNOSIS — E1122 Type 2 diabetes mellitus with diabetic chronic kidney disease: Secondary | ICD-10-CM | POA: Diagnosis not present

## 2018-04-19 DIAGNOSIS — D631 Anemia in chronic kidney disease: Secondary | ICD-10-CM | POA: Diagnosis not present

## 2018-04-19 DIAGNOSIS — N2581 Secondary hyperparathyroidism of renal origin: Secondary | ICD-10-CM | POA: Diagnosis not present

## 2018-04-21 DIAGNOSIS — N186 End stage renal disease: Secondary | ICD-10-CM | POA: Diagnosis not present

## 2018-04-21 DIAGNOSIS — E1122 Type 2 diabetes mellitus with diabetic chronic kidney disease: Secondary | ICD-10-CM | POA: Diagnosis not present

## 2018-04-21 DIAGNOSIS — D631 Anemia in chronic kidney disease: Secondary | ICD-10-CM | POA: Diagnosis not present

## 2018-04-21 DIAGNOSIS — N2581 Secondary hyperparathyroidism of renal origin: Secondary | ICD-10-CM | POA: Diagnosis not present

## 2018-04-24 DIAGNOSIS — N2581 Secondary hyperparathyroidism of renal origin: Secondary | ICD-10-CM | POA: Diagnosis not present

## 2018-04-24 DIAGNOSIS — N186 End stage renal disease: Secondary | ICD-10-CM | POA: Diagnosis not present

## 2018-04-24 DIAGNOSIS — E1122 Type 2 diabetes mellitus with diabetic chronic kidney disease: Secondary | ICD-10-CM | POA: Diagnosis not present

## 2018-04-24 DIAGNOSIS — D631 Anemia in chronic kidney disease: Secondary | ICD-10-CM | POA: Diagnosis not present

## 2018-04-26 DIAGNOSIS — D631 Anemia in chronic kidney disease: Secondary | ICD-10-CM | POA: Diagnosis not present

## 2018-04-26 DIAGNOSIS — N186 End stage renal disease: Secondary | ICD-10-CM | POA: Diagnosis not present

## 2018-04-26 DIAGNOSIS — E1122 Type 2 diabetes mellitus with diabetic chronic kidney disease: Secondary | ICD-10-CM | POA: Diagnosis not present

## 2018-04-26 DIAGNOSIS — N2581 Secondary hyperparathyroidism of renal origin: Secondary | ICD-10-CM | POA: Diagnosis not present

## 2018-04-28 DIAGNOSIS — N186 End stage renal disease: Secondary | ICD-10-CM | POA: Diagnosis not present

## 2018-04-28 DIAGNOSIS — E1122 Type 2 diabetes mellitus with diabetic chronic kidney disease: Secondary | ICD-10-CM | POA: Diagnosis not present

## 2018-04-28 DIAGNOSIS — N2581 Secondary hyperparathyroidism of renal origin: Secondary | ICD-10-CM | POA: Diagnosis not present

## 2018-04-28 DIAGNOSIS — D631 Anemia in chronic kidney disease: Secondary | ICD-10-CM | POA: Diagnosis not present

## 2018-05-01 DIAGNOSIS — N186 End stage renal disease: Secondary | ICD-10-CM | POA: Diagnosis not present

## 2018-05-01 DIAGNOSIS — E1122 Type 2 diabetes mellitus with diabetic chronic kidney disease: Secondary | ICD-10-CM | POA: Diagnosis not present

## 2018-05-01 DIAGNOSIS — D631 Anemia in chronic kidney disease: Secondary | ICD-10-CM | POA: Diagnosis not present

## 2018-05-01 DIAGNOSIS — N2581 Secondary hyperparathyroidism of renal origin: Secondary | ICD-10-CM | POA: Diagnosis not present

## 2018-05-03 DIAGNOSIS — N2581 Secondary hyperparathyroidism of renal origin: Secondary | ICD-10-CM | POA: Diagnosis not present

## 2018-05-03 DIAGNOSIS — E1122 Type 2 diabetes mellitus with diabetic chronic kidney disease: Secondary | ICD-10-CM | POA: Diagnosis not present

## 2018-05-03 DIAGNOSIS — N186 End stage renal disease: Secondary | ICD-10-CM | POA: Diagnosis not present

## 2018-05-03 DIAGNOSIS — D631 Anemia in chronic kidney disease: Secondary | ICD-10-CM | POA: Diagnosis not present

## 2018-05-05 DIAGNOSIS — N186 End stage renal disease: Secondary | ICD-10-CM | POA: Diagnosis not present

## 2018-05-05 DIAGNOSIS — N2581 Secondary hyperparathyroidism of renal origin: Secondary | ICD-10-CM | POA: Diagnosis not present

## 2018-05-05 DIAGNOSIS — D631 Anemia in chronic kidney disease: Secondary | ICD-10-CM | POA: Diagnosis not present

## 2018-05-05 DIAGNOSIS — E1122 Type 2 diabetes mellitus with diabetic chronic kidney disease: Secondary | ICD-10-CM | POA: Diagnosis not present

## 2018-05-06 DIAGNOSIS — N186 End stage renal disease: Secondary | ICD-10-CM | POA: Diagnosis not present

## 2018-05-06 DIAGNOSIS — E1122 Type 2 diabetes mellitus with diabetic chronic kidney disease: Secondary | ICD-10-CM | POA: Diagnosis not present

## 2018-05-06 DIAGNOSIS — Z992 Dependence on renal dialysis: Secondary | ICD-10-CM | POA: Diagnosis not present

## 2018-05-08 DIAGNOSIS — D631 Anemia in chronic kidney disease: Secondary | ICD-10-CM | POA: Diagnosis not present

## 2018-05-08 DIAGNOSIS — N2581 Secondary hyperparathyroidism of renal origin: Secondary | ICD-10-CM | POA: Diagnosis not present

## 2018-05-08 DIAGNOSIS — N186 End stage renal disease: Secondary | ICD-10-CM | POA: Diagnosis not present

## 2018-05-08 DIAGNOSIS — E1122 Type 2 diabetes mellitus with diabetic chronic kidney disease: Secondary | ICD-10-CM | POA: Diagnosis not present

## 2018-05-10 ENCOUNTER — Other Ambulatory Visit: Payer: Self-pay | Admitting: Internal Medicine

## 2018-05-10 ENCOUNTER — Other Ambulatory Visit: Payer: Self-pay | Admitting: Family Medicine

## 2018-05-10 DIAGNOSIS — N186 End stage renal disease: Secondary | ICD-10-CM | POA: Diagnosis not present

## 2018-05-10 DIAGNOSIS — D631 Anemia in chronic kidney disease: Secondary | ICD-10-CM | POA: Diagnosis not present

## 2018-05-10 DIAGNOSIS — N2581 Secondary hyperparathyroidism of renal origin: Secondary | ICD-10-CM | POA: Diagnosis not present

## 2018-05-10 DIAGNOSIS — E1122 Type 2 diabetes mellitus with diabetic chronic kidney disease: Secondary | ICD-10-CM | POA: Diagnosis not present

## 2018-05-12 DIAGNOSIS — N2581 Secondary hyperparathyroidism of renal origin: Secondary | ICD-10-CM | POA: Diagnosis not present

## 2018-05-12 DIAGNOSIS — D631 Anemia in chronic kidney disease: Secondary | ICD-10-CM | POA: Diagnosis not present

## 2018-05-12 DIAGNOSIS — E1122 Type 2 diabetes mellitus with diabetic chronic kidney disease: Secondary | ICD-10-CM | POA: Diagnosis not present

## 2018-05-12 DIAGNOSIS — N186 End stage renal disease: Secondary | ICD-10-CM | POA: Diagnosis not present

## 2018-05-15 DIAGNOSIS — N2581 Secondary hyperparathyroidism of renal origin: Secondary | ICD-10-CM | POA: Diagnosis not present

## 2018-05-15 DIAGNOSIS — E1122 Type 2 diabetes mellitus with diabetic chronic kidney disease: Secondary | ICD-10-CM | POA: Diagnosis not present

## 2018-05-15 DIAGNOSIS — N186 End stage renal disease: Secondary | ICD-10-CM | POA: Diagnosis not present

## 2018-05-15 DIAGNOSIS — D631 Anemia in chronic kidney disease: Secondary | ICD-10-CM | POA: Diagnosis not present

## 2018-05-17 DIAGNOSIS — N2581 Secondary hyperparathyroidism of renal origin: Secondary | ICD-10-CM | POA: Diagnosis not present

## 2018-05-17 DIAGNOSIS — N186 End stage renal disease: Secondary | ICD-10-CM | POA: Diagnosis not present

## 2018-05-17 DIAGNOSIS — D631 Anemia in chronic kidney disease: Secondary | ICD-10-CM | POA: Diagnosis not present

## 2018-05-17 DIAGNOSIS — E1122 Type 2 diabetes mellitus with diabetic chronic kidney disease: Secondary | ICD-10-CM | POA: Diagnosis not present

## 2018-05-19 DIAGNOSIS — N186 End stage renal disease: Secondary | ICD-10-CM | POA: Diagnosis not present

## 2018-05-19 DIAGNOSIS — N2581 Secondary hyperparathyroidism of renal origin: Secondary | ICD-10-CM | POA: Diagnosis not present

## 2018-05-19 DIAGNOSIS — E1122 Type 2 diabetes mellitus with diabetic chronic kidney disease: Secondary | ICD-10-CM | POA: Diagnosis not present

## 2018-05-19 DIAGNOSIS — D631 Anemia in chronic kidney disease: Secondary | ICD-10-CM | POA: Diagnosis not present

## 2018-05-22 DIAGNOSIS — E1122 Type 2 diabetes mellitus with diabetic chronic kidney disease: Secondary | ICD-10-CM | POA: Diagnosis not present

## 2018-05-22 DIAGNOSIS — N2581 Secondary hyperparathyroidism of renal origin: Secondary | ICD-10-CM | POA: Diagnosis not present

## 2018-05-22 DIAGNOSIS — D631 Anemia in chronic kidney disease: Secondary | ICD-10-CM | POA: Diagnosis not present

## 2018-05-22 DIAGNOSIS — N186 End stage renal disease: Secondary | ICD-10-CM | POA: Diagnosis not present

## 2018-05-24 DIAGNOSIS — E1122 Type 2 diabetes mellitus with diabetic chronic kidney disease: Secondary | ICD-10-CM | POA: Diagnosis not present

## 2018-05-24 DIAGNOSIS — D631 Anemia in chronic kidney disease: Secondary | ICD-10-CM | POA: Diagnosis not present

## 2018-05-24 DIAGNOSIS — N186 End stage renal disease: Secondary | ICD-10-CM | POA: Diagnosis not present

## 2018-05-24 DIAGNOSIS — N2581 Secondary hyperparathyroidism of renal origin: Secondary | ICD-10-CM | POA: Diagnosis not present

## 2018-05-25 ENCOUNTER — Other Ambulatory Visit: Payer: Self-pay

## 2018-05-25 ENCOUNTER — Encounter: Payer: Self-pay | Admitting: Internal Medicine

## 2018-05-25 ENCOUNTER — Ambulatory Visit (INDEPENDENT_AMBULATORY_CARE_PROVIDER_SITE_OTHER): Payer: Medicare Other | Admitting: Internal Medicine

## 2018-05-25 VITALS — BP 110/60 | HR 105 | Temp 98.7°F | Wt 204.0 lb

## 2018-05-25 DIAGNOSIS — E1139 Type 2 diabetes mellitus with other diabetic ophthalmic complication: Secondary | ICD-10-CM | POA: Diagnosis present

## 2018-05-25 DIAGNOSIS — Z0000 Encounter for general adult medical examination without abnormal findings: Secondary | ICD-10-CM | POA: Diagnosis not present

## 2018-05-25 DIAGNOSIS — Z1211 Encounter for screening for malignant neoplasm of colon: Secondary | ICD-10-CM

## 2018-05-25 DIAGNOSIS — Z794 Long term (current) use of insulin: Secondary | ICD-10-CM | POA: Diagnosis not present

## 2018-05-25 DIAGNOSIS — E785 Hyperlipidemia, unspecified: Secondary | ICD-10-CM | POA: Diagnosis not present

## 2018-05-25 LAB — POCT GLYCOSYLATED HEMOGLOBIN (HGB A1C): HbA1c, POC (controlled diabetic range): 6.8 % (ref 0.0–7.0)

## 2018-05-25 MED ORDER — ONETOUCH DELICA LANCETS 33G MISC: 0 refills | Status: DC

## 2018-05-25 MED ORDER — GLUCOSE BLOOD VI STRP: ORAL_STRIP | 12 refills | Status: DC

## 2018-05-25 MED ORDER — ONETOUCH VERIO W/DEVICE KIT: 1.0000 | PACK | Freq: Two times a day (BID) | 0 refills | Status: DC

## 2018-05-25 MED ORDER — ONETOUCH DELICA LANCING DEV MISC: 0 refills | Status: DC

## 2018-05-25 NOTE — Progress Notes (Signed)
   [facility] Phone: [phone]  Subjective:  [name] is a 52 year old female presenting to clinic for follow-up of her diabetes and HLD.  T2DM: Has not been checking her blood sugars because her meter broke. Was previously taking Humulin 70/30 8 units bid but kept feeling like her blood sugar was getting low because she felt queasy. She then dropped the dose to 6 units daily, but the feelings of low blood sugar continued. Now currently taking 3 units bid. Denies polyuria and polydipsia.  HLD: Taking Lipitor 40mg  daily. No side effects. No RUQ pain or muscle pain. No chest pain.  ROS: See HPI for pertinent positives and negatives  Past Medical History- HTN, T2DM, ESRD on HD, s/p right BKA, HLD, blindness  Family history reviewed for today's visit. No changes.  Social history- patient is a never smoker  Objective: BP 110/60   Pulse (!) 105   Temp 98.7 F (37.1 C) (Oral)   Wt 204 lb (92.5 kg)   SpO2 99%   BMI 31.95 kg/m  Gen: NAD, alert, cooperative with exam HEENT: NCAT, EOMI, MMM Neck: FROM, supple CV: RRR, no murmur Resp: CTABL, no wheezes, normal work of breathing Msk: right BKA Feet: Left foot without any deformities, ulcerations, or skin breakdown. Left foot is warm with palpable PT and DP pulses. Sensation of left foot intact to touch, but unable to feet monofilament throughout entire foot. Neuro: Alert and oriented, no gross deficits Skin: No rashes, no lesions Psych: Appropriate behavior  Assessment/Plan: T2DM: Well-controlled. A1c 6.8% today. Goal <7%. Patient feeling like she is having low blood sugars whenever she takes her insulin. Has decreased the dose of her Humulin 70/30 on her own from 8 units bid to 3 units bid. Has not been checking her blood sugars because her meter broke, so she does not know if she has truly had lows. - Diabetic foot exam performed today - Rx for new meter sent to pharmacy - Start checking blood sugars twice  daily and whenever she feels like her blood sugars might be low - Continue Humulin 70/30 3 units bid for now. Will titrate based on blood sugar log. - Patient will call for any blood sugars that are very low or very high - Otherwise, can follow-up with new PCP in 3 months  HLD: Last lipid panel 03/2017 with Chol 129, HDL 54, LDL 41, TG 172. - Recheck lipid panel - Continue Lipitor 40mg  daily  Healthcare Maintenance: - Referral placed to GI for colonoscopy   Hyman Bible, MD PGY-3

## 2018-05-25 NOTE — Patient Instructions (Addendum)
It was so nice to see you today!  I sent in a new meter to your pharmacy. Please keep taking the 3 units of the insulin twice a day. Please check your blood sugars 2-3 times per day.  I checked your cholesterol today and I will call you with those results.  I have sent in a referral for you to have a colonoscopy. You should hear from their office in 2 weeks to schedule this.  -Dr. Brett Albino

## 2018-05-26 DIAGNOSIS — D631 Anemia in chronic kidney disease: Secondary | ICD-10-CM | POA: Diagnosis not present

## 2018-05-26 DIAGNOSIS — E1122 Type 2 diabetes mellitus with diabetic chronic kidney disease: Secondary | ICD-10-CM | POA: Diagnosis not present

## 2018-05-26 DIAGNOSIS — N2581 Secondary hyperparathyroidism of renal origin: Secondary | ICD-10-CM | POA: Diagnosis not present

## 2018-05-26 DIAGNOSIS — N186 End stage renal disease: Secondary | ICD-10-CM | POA: Diagnosis not present

## 2018-05-26 LAB — LIPID PANEL
Chol/HDL Ratio: 3.5 ratio (ref 0.0–4.4)
Cholesterol, Total: 163 mg/dL (ref 100–199)
HDL: 46 mg/dL (ref >39)
LDL Calculated: 79 mg/dL (ref 0–99)
Triglycerides: 188 mg/dL — ABNORMAL HIGH (ref 0–149)
VLDL Cholesterol Cal: 38 mg/dL (ref 5–40)

## 2018-05-26 NOTE — Assessment & Plan Note (Signed)
Referral placed to GI for colonoscopy.

## 2018-05-26 NOTE — Assessment & Plan Note (Addendum)
Well-controlled. A1c 6.8% today. Goal <7%. Patient feeling like she is having low blood sugars whenever she takes her insulin. Has decreased the dose of her Humulin 70/30 on her own from 8 units bid to 3 units bid. Has not been checking her blood sugars because her meter broke, so she does not know if she has truly had lows. - Diabetic foot exam performed today - Rx for new meter sent to pharmacy - Start checking blood sugars twice daily and whenever she feels like her blood sugars might be low - Continue Humulin 70/30 3 units bid for now. Will titrate based on blood sugar log. - Patient will call for any blood sugars that are very low or very high - Otherwise, can follow-up with new PCP in 3 months

## 2018-05-26 NOTE — Assessment & Plan Note (Signed)
Last lipid panel 03/2017 with Chol 129, HDL 54, LDL 41, TG 172. - Recheck lipid panel - Continue Lipitor 40mg  daily

## 2018-05-29 ENCOUNTER — Encounter: Payer: Self-pay | Admitting: Gastroenterology

## 2018-05-29 DIAGNOSIS — D631 Anemia in chronic kidney disease: Secondary | ICD-10-CM | POA: Diagnosis not present

## 2018-05-29 DIAGNOSIS — N186 End stage renal disease: Secondary | ICD-10-CM | POA: Diagnosis not present

## 2018-05-29 DIAGNOSIS — E1122 Type 2 diabetes mellitus with diabetic chronic kidney disease: Secondary | ICD-10-CM | POA: Diagnosis not present

## 2018-05-29 DIAGNOSIS — N2581 Secondary hyperparathyroidism of renal origin: Secondary | ICD-10-CM | POA: Diagnosis not present

## 2018-05-31 DIAGNOSIS — N186 End stage renal disease: Secondary | ICD-10-CM | POA: Diagnosis not present

## 2018-05-31 DIAGNOSIS — N2581 Secondary hyperparathyroidism of renal origin: Secondary | ICD-10-CM | POA: Diagnosis not present

## 2018-05-31 DIAGNOSIS — D631 Anemia in chronic kidney disease: Secondary | ICD-10-CM | POA: Diagnosis not present

## 2018-05-31 DIAGNOSIS — E1122 Type 2 diabetes mellitus with diabetic chronic kidney disease: Secondary | ICD-10-CM | POA: Diagnosis not present

## 2018-06-02 DIAGNOSIS — N2581 Secondary hyperparathyroidism of renal origin: Secondary | ICD-10-CM | POA: Diagnosis not present

## 2018-06-02 DIAGNOSIS — E1122 Type 2 diabetes mellitus with diabetic chronic kidney disease: Secondary | ICD-10-CM | POA: Diagnosis not present

## 2018-06-02 DIAGNOSIS — N186 End stage renal disease: Secondary | ICD-10-CM | POA: Diagnosis not present

## 2018-06-02 DIAGNOSIS — D631 Anemia in chronic kidney disease: Secondary | ICD-10-CM | POA: Diagnosis not present

## 2018-06-05 DIAGNOSIS — E1122 Type 2 diabetes mellitus with diabetic chronic kidney disease: Secondary | ICD-10-CM | POA: Diagnosis not present

## 2018-06-05 DIAGNOSIS — Z992 Dependence on renal dialysis: Secondary | ICD-10-CM | POA: Diagnosis not present

## 2018-06-05 DIAGNOSIS — N186 End stage renal disease: Secondary | ICD-10-CM | POA: Diagnosis not present

## 2018-06-05 DIAGNOSIS — D631 Anemia in chronic kidney disease: Secondary | ICD-10-CM | POA: Diagnosis not present

## 2018-06-05 DIAGNOSIS — N2581 Secondary hyperparathyroidism of renal origin: Secondary | ICD-10-CM | POA: Diagnosis not present

## 2018-06-07 DIAGNOSIS — D631 Anemia in chronic kidney disease: Secondary | ICD-10-CM | POA: Diagnosis not present

## 2018-06-07 DIAGNOSIS — N2581 Secondary hyperparathyroidism of renal origin: Secondary | ICD-10-CM | POA: Diagnosis not present

## 2018-06-07 DIAGNOSIS — N186 End stage renal disease: Secondary | ICD-10-CM | POA: Diagnosis not present

## 2018-06-07 DIAGNOSIS — E1122 Type 2 diabetes mellitus with diabetic chronic kidney disease: Secondary | ICD-10-CM | POA: Diagnosis not present

## 2018-06-09 DIAGNOSIS — D631 Anemia in chronic kidney disease: Secondary | ICD-10-CM | POA: Diagnosis not present

## 2018-06-09 DIAGNOSIS — N2581 Secondary hyperparathyroidism of renal origin: Secondary | ICD-10-CM | POA: Diagnosis not present

## 2018-06-09 DIAGNOSIS — E1122 Type 2 diabetes mellitus with diabetic chronic kidney disease: Secondary | ICD-10-CM | POA: Diagnosis not present

## 2018-06-09 DIAGNOSIS — N186 End stage renal disease: Secondary | ICD-10-CM | POA: Diagnosis not present

## 2018-06-12 DIAGNOSIS — E1122 Type 2 diabetes mellitus with diabetic chronic kidney disease: Secondary | ICD-10-CM | POA: Diagnosis not present

## 2018-06-12 DIAGNOSIS — D631 Anemia in chronic kidney disease: Secondary | ICD-10-CM | POA: Diagnosis not present

## 2018-06-12 DIAGNOSIS — N186 End stage renal disease: Secondary | ICD-10-CM | POA: Diagnosis not present

## 2018-06-12 DIAGNOSIS — N2581 Secondary hyperparathyroidism of renal origin: Secondary | ICD-10-CM | POA: Diagnosis not present

## 2018-06-13 DIAGNOSIS — E113593 Type 2 diabetes mellitus with proliferative diabetic retinopathy without macular edema, bilateral: Secondary | ICD-10-CM | POA: Diagnosis not present

## 2018-06-13 DIAGNOSIS — H35371 Puckering of macula, right eye: Secondary | ICD-10-CM | POA: Diagnosis not present

## 2018-06-14 DIAGNOSIS — E1122 Type 2 diabetes mellitus with diabetic chronic kidney disease: Secondary | ICD-10-CM | POA: Diagnosis not present

## 2018-06-14 DIAGNOSIS — N2581 Secondary hyperparathyroidism of renal origin: Secondary | ICD-10-CM | POA: Diagnosis not present

## 2018-06-14 DIAGNOSIS — N186 End stage renal disease: Secondary | ICD-10-CM | POA: Diagnosis not present

## 2018-06-14 DIAGNOSIS — D631 Anemia in chronic kidney disease: Secondary | ICD-10-CM | POA: Diagnosis not present

## 2018-06-16 DIAGNOSIS — N2581 Secondary hyperparathyroidism of renal origin: Secondary | ICD-10-CM | POA: Diagnosis not present

## 2018-06-16 DIAGNOSIS — E1122 Type 2 diabetes mellitus with diabetic chronic kidney disease: Secondary | ICD-10-CM | POA: Diagnosis not present

## 2018-06-16 DIAGNOSIS — D631 Anemia in chronic kidney disease: Secondary | ICD-10-CM | POA: Diagnosis not present

## 2018-06-16 DIAGNOSIS — N186 End stage renal disease: Secondary | ICD-10-CM | POA: Diagnosis not present

## 2018-06-19 DIAGNOSIS — N2581 Secondary hyperparathyroidism of renal origin: Secondary | ICD-10-CM | POA: Diagnosis not present

## 2018-06-19 DIAGNOSIS — D631 Anemia in chronic kidney disease: Secondary | ICD-10-CM | POA: Diagnosis not present

## 2018-06-19 DIAGNOSIS — N186 End stage renal disease: Secondary | ICD-10-CM | POA: Diagnosis not present

## 2018-06-19 DIAGNOSIS — E1122 Type 2 diabetes mellitus with diabetic chronic kidney disease: Secondary | ICD-10-CM | POA: Diagnosis not present

## 2018-06-21 DIAGNOSIS — E1122 Type 2 diabetes mellitus with diabetic chronic kidney disease: Secondary | ICD-10-CM | POA: Diagnosis not present

## 2018-06-21 DIAGNOSIS — N186 End stage renal disease: Secondary | ICD-10-CM | POA: Diagnosis not present

## 2018-06-21 DIAGNOSIS — D631 Anemia in chronic kidney disease: Secondary | ICD-10-CM | POA: Diagnosis not present

## 2018-06-21 DIAGNOSIS — N2581 Secondary hyperparathyroidism of renal origin: Secondary | ICD-10-CM | POA: Diagnosis not present

## 2018-06-23 DIAGNOSIS — D631 Anemia in chronic kidney disease: Secondary | ICD-10-CM | POA: Diagnosis not present

## 2018-06-23 DIAGNOSIS — E1122 Type 2 diabetes mellitus with diabetic chronic kidney disease: Secondary | ICD-10-CM | POA: Diagnosis not present

## 2018-06-23 DIAGNOSIS — N2581 Secondary hyperparathyroidism of renal origin: Secondary | ICD-10-CM | POA: Diagnosis not present

## 2018-06-23 DIAGNOSIS — N186 End stage renal disease: Secondary | ICD-10-CM | POA: Diagnosis not present

## 2018-06-26 DIAGNOSIS — N2581 Secondary hyperparathyroidism of renal origin: Secondary | ICD-10-CM | POA: Diagnosis not present

## 2018-06-26 DIAGNOSIS — D631 Anemia in chronic kidney disease: Secondary | ICD-10-CM | POA: Diagnosis not present

## 2018-06-26 DIAGNOSIS — N186 End stage renal disease: Secondary | ICD-10-CM | POA: Diagnosis not present

## 2018-06-26 DIAGNOSIS — E1122 Type 2 diabetes mellitus with diabetic chronic kidney disease: Secondary | ICD-10-CM | POA: Diagnosis not present

## 2018-06-28 DIAGNOSIS — N2581 Secondary hyperparathyroidism of renal origin: Secondary | ICD-10-CM | POA: Diagnosis not present

## 2018-06-28 DIAGNOSIS — E1122 Type 2 diabetes mellitus with diabetic chronic kidney disease: Secondary | ICD-10-CM | POA: Diagnosis not present

## 2018-06-28 DIAGNOSIS — D631 Anemia in chronic kidney disease: Secondary | ICD-10-CM | POA: Diagnosis not present

## 2018-06-28 DIAGNOSIS — N186 End stage renal disease: Secondary | ICD-10-CM | POA: Diagnosis not present

## 2018-06-30 DIAGNOSIS — N2581 Secondary hyperparathyroidism of renal origin: Secondary | ICD-10-CM | POA: Diagnosis not present

## 2018-06-30 DIAGNOSIS — D631 Anemia in chronic kidney disease: Secondary | ICD-10-CM | POA: Diagnosis not present

## 2018-06-30 DIAGNOSIS — N186 End stage renal disease: Secondary | ICD-10-CM | POA: Diagnosis not present

## 2018-06-30 DIAGNOSIS — E1122 Type 2 diabetes mellitus with diabetic chronic kidney disease: Secondary | ICD-10-CM | POA: Diagnosis not present

## 2018-07-03 DIAGNOSIS — D631 Anemia in chronic kidney disease: Secondary | ICD-10-CM | POA: Diagnosis not present

## 2018-07-03 DIAGNOSIS — N2581 Secondary hyperparathyroidism of renal origin: Secondary | ICD-10-CM | POA: Diagnosis not present

## 2018-07-03 DIAGNOSIS — N186 End stage renal disease: Secondary | ICD-10-CM | POA: Diagnosis not present

## 2018-07-03 DIAGNOSIS — E1122 Type 2 diabetes mellitus with diabetic chronic kidney disease: Secondary | ICD-10-CM | POA: Diagnosis not present

## 2018-07-05 DIAGNOSIS — N2581 Secondary hyperparathyroidism of renal origin: Secondary | ICD-10-CM | POA: Diagnosis not present

## 2018-07-05 DIAGNOSIS — N186 End stage renal disease: Secondary | ICD-10-CM | POA: Diagnosis not present

## 2018-07-05 DIAGNOSIS — D631 Anemia in chronic kidney disease: Secondary | ICD-10-CM | POA: Diagnosis not present

## 2018-07-05 DIAGNOSIS — E1122 Type 2 diabetes mellitus with diabetic chronic kidney disease: Secondary | ICD-10-CM | POA: Diagnosis not present

## 2018-07-06 DIAGNOSIS — N186 End stage renal disease: Secondary | ICD-10-CM | POA: Diagnosis not present

## 2018-07-06 DIAGNOSIS — Z992 Dependence on renal dialysis: Secondary | ICD-10-CM | POA: Diagnosis not present

## 2018-07-06 DIAGNOSIS — E1122 Type 2 diabetes mellitus with diabetic chronic kidney disease: Secondary | ICD-10-CM | POA: Diagnosis not present

## 2018-07-07 DIAGNOSIS — D631 Anemia in chronic kidney disease: Secondary | ICD-10-CM | POA: Diagnosis not present

## 2018-07-07 DIAGNOSIS — N2581 Secondary hyperparathyroidism of renal origin: Secondary | ICD-10-CM | POA: Diagnosis not present

## 2018-07-07 DIAGNOSIS — E1122 Type 2 diabetes mellitus with diabetic chronic kidney disease: Secondary | ICD-10-CM | POA: Diagnosis not present

## 2018-07-07 DIAGNOSIS — N186 End stage renal disease: Secondary | ICD-10-CM | POA: Diagnosis not present

## 2018-07-10 DIAGNOSIS — N186 End stage renal disease: Secondary | ICD-10-CM | POA: Diagnosis not present

## 2018-07-10 DIAGNOSIS — N2581 Secondary hyperparathyroidism of renal origin: Secondary | ICD-10-CM | POA: Diagnosis not present

## 2018-07-10 DIAGNOSIS — D631 Anemia in chronic kidney disease: Secondary | ICD-10-CM | POA: Diagnosis not present

## 2018-07-10 DIAGNOSIS — E1122 Type 2 diabetes mellitus with diabetic chronic kidney disease: Secondary | ICD-10-CM | POA: Diagnosis not present

## 2018-07-12 DIAGNOSIS — N186 End stage renal disease: Secondary | ICD-10-CM | POA: Diagnosis not present

## 2018-07-12 DIAGNOSIS — D631 Anemia in chronic kidney disease: Secondary | ICD-10-CM | POA: Diagnosis not present

## 2018-07-12 DIAGNOSIS — E1122 Type 2 diabetes mellitus with diabetic chronic kidney disease: Secondary | ICD-10-CM | POA: Diagnosis not present

## 2018-07-12 DIAGNOSIS — N2581 Secondary hyperparathyroidism of renal origin: Secondary | ICD-10-CM | POA: Diagnosis not present

## 2018-07-13 ENCOUNTER — Other Ambulatory Visit: Payer: Self-pay | Admitting: *Deleted

## 2018-07-13 MED ORDER — NORTRIPTYLINE HCL 25 MG PO CAPS: 25.0000 mg | ORAL_CAPSULE | Freq: Every day | ORAL | 0 refills | Status: DC

## 2018-07-13 MED ORDER — INSULIN PEN NEEDLE 32G X 4 MM MISC: 1.0000 | Freq: Three times a day (TID) | 2 refills | Status: DC

## 2018-07-13 MED ORDER — GABAPENTIN 100 MG PO CAPS: 100.0000 mg | ORAL_CAPSULE | Freq: Every day | ORAL | 0 refills | Status: DC | PRN

## 2018-07-14 DIAGNOSIS — D631 Anemia in chronic kidney disease: Secondary | ICD-10-CM | POA: Diagnosis not present

## 2018-07-14 DIAGNOSIS — N2581 Secondary hyperparathyroidism of renal origin: Secondary | ICD-10-CM | POA: Diagnosis not present

## 2018-07-14 DIAGNOSIS — E1122 Type 2 diabetes mellitus with diabetic chronic kidney disease: Secondary | ICD-10-CM | POA: Diagnosis not present

## 2018-07-14 DIAGNOSIS — N186 End stage renal disease: Secondary | ICD-10-CM | POA: Diagnosis not present

## 2018-07-17 DIAGNOSIS — N186 End stage renal disease: Secondary | ICD-10-CM | POA: Diagnosis not present

## 2018-07-17 DIAGNOSIS — N2581 Secondary hyperparathyroidism of renal origin: Secondary | ICD-10-CM | POA: Diagnosis not present

## 2018-07-17 DIAGNOSIS — E1122 Type 2 diabetes mellitus with diabetic chronic kidney disease: Secondary | ICD-10-CM | POA: Diagnosis not present

## 2018-07-17 DIAGNOSIS — D631 Anemia in chronic kidney disease: Secondary | ICD-10-CM | POA: Diagnosis not present

## 2018-07-19 DIAGNOSIS — E1122 Type 2 diabetes mellitus with diabetic chronic kidney disease: Secondary | ICD-10-CM | POA: Diagnosis not present

## 2018-07-19 DIAGNOSIS — N186 End stage renal disease: Secondary | ICD-10-CM | POA: Diagnosis not present

## 2018-07-19 DIAGNOSIS — D631 Anemia in chronic kidney disease: Secondary | ICD-10-CM | POA: Diagnosis not present

## 2018-07-19 DIAGNOSIS — N2581 Secondary hyperparathyroidism of renal origin: Secondary | ICD-10-CM | POA: Diagnosis not present

## 2018-07-21 DIAGNOSIS — E1122 Type 2 diabetes mellitus with diabetic chronic kidney disease: Secondary | ICD-10-CM | POA: Diagnosis not present

## 2018-07-21 DIAGNOSIS — N2581 Secondary hyperparathyroidism of renal origin: Secondary | ICD-10-CM | POA: Diagnosis not present

## 2018-07-21 DIAGNOSIS — N186 End stage renal disease: Secondary | ICD-10-CM | POA: Diagnosis not present

## 2018-07-21 DIAGNOSIS — D631 Anemia in chronic kidney disease: Secondary | ICD-10-CM | POA: Diagnosis not present

## 2018-07-24 DIAGNOSIS — N186 End stage renal disease: Secondary | ICD-10-CM | POA: Diagnosis not present

## 2018-07-24 DIAGNOSIS — E1122 Type 2 diabetes mellitus with diabetic chronic kidney disease: Secondary | ICD-10-CM | POA: Diagnosis not present

## 2018-07-24 DIAGNOSIS — D631 Anemia in chronic kidney disease: Secondary | ICD-10-CM | POA: Diagnosis not present

## 2018-07-24 DIAGNOSIS — N2581 Secondary hyperparathyroidism of renal origin: Secondary | ICD-10-CM | POA: Diagnosis not present

## 2018-07-26 DIAGNOSIS — D631 Anemia in chronic kidney disease: Secondary | ICD-10-CM | POA: Diagnosis not present

## 2018-07-26 DIAGNOSIS — N186 End stage renal disease: Secondary | ICD-10-CM | POA: Diagnosis not present

## 2018-07-26 DIAGNOSIS — N2581 Secondary hyperparathyroidism of renal origin: Secondary | ICD-10-CM | POA: Diagnosis not present

## 2018-07-26 DIAGNOSIS — E1122 Type 2 diabetes mellitus with diabetic chronic kidney disease: Secondary | ICD-10-CM | POA: Diagnosis not present

## 2018-07-28 DIAGNOSIS — D631 Anemia in chronic kidney disease: Secondary | ICD-10-CM | POA: Diagnosis not present

## 2018-07-28 DIAGNOSIS — N2581 Secondary hyperparathyroidism of renal origin: Secondary | ICD-10-CM | POA: Diagnosis not present

## 2018-07-28 DIAGNOSIS — E1122 Type 2 diabetes mellitus with diabetic chronic kidney disease: Secondary | ICD-10-CM | POA: Diagnosis not present

## 2018-07-28 DIAGNOSIS — N186 End stage renal disease: Secondary | ICD-10-CM | POA: Diagnosis not present

## 2018-07-31 DIAGNOSIS — N2581 Secondary hyperparathyroidism of renal origin: Secondary | ICD-10-CM | POA: Diagnosis not present

## 2018-07-31 DIAGNOSIS — E1122 Type 2 diabetes mellitus with diabetic chronic kidney disease: Secondary | ICD-10-CM | POA: Diagnosis not present

## 2018-07-31 DIAGNOSIS — D631 Anemia in chronic kidney disease: Secondary | ICD-10-CM | POA: Diagnosis not present

## 2018-07-31 DIAGNOSIS — N186 End stage renal disease: Secondary | ICD-10-CM | POA: Diagnosis not present

## 2018-08-02 ENCOUNTER — Encounter: Payer: Medicare Other | Admitting: Gastroenterology

## 2018-08-02 DIAGNOSIS — N2581 Secondary hyperparathyroidism of renal origin: Secondary | ICD-10-CM | POA: Diagnosis not present

## 2018-08-02 DIAGNOSIS — D631 Anemia in chronic kidney disease: Secondary | ICD-10-CM | POA: Diagnosis not present

## 2018-08-02 DIAGNOSIS — E1122 Type 2 diabetes mellitus with diabetic chronic kidney disease: Secondary | ICD-10-CM | POA: Diagnosis not present

## 2018-08-02 DIAGNOSIS — N186 End stage renal disease: Secondary | ICD-10-CM | POA: Diagnosis not present

## 2018-08-04 DIAGNOSIS — D631 Anemia in chronic kidney disease: Secondary | ICD-10-CM | POA: Diagnosis not present

## 2018-08-04 DIAGNOSIS — N2581 Secondary hyperparathyroidism of renal origin: Secondary | ICD-10-CM | POA: Diagnosis not present

## 2018-08-04 DIAGNOSIS — E1122 Type 2 diabetes mellitus with diabetic chronic kidney disease: Secondary | ICD-10-CM | POA: Diagnosis not present

## 2018-08-04 DIAGNOSIS — N186 End stage renal disease: Secondary | ICD-10-CM | POA: Diagnosis not present

## 2018-08-06 DIAGNOSIS — N186 End stage renal disease: Secondary | ICD-10-CM | POA: Diagnosis not present

## 2018-08-06 DIAGNOSIS — Z992 Dependence on renal dialysis: Secondary | ICD-10-CM | POA: Diagnosis not present

## 2018-08-06 DIAGNOSIS — E1122 Type 2 diabetes mellitus with diabetic chronic kidney disease: Secondary | ICD-10-CM | POA: Diagnosis not present

## 2018-08-07 DIAGNOSIS — N186 End stage renal disease: Secondary | ICD-10-CM | POA: Diagnosis not present

## 2018-08-07 DIAGNOSIS — Z23 Encounter for immunization: Secondary | ICD-10-CM | POA: Diagnosis not present

## 2018-08-07 DIAGNOSIS — E1122 Type 2 diabetes mellitus with diabetic chronic kidney disease: Secondary | ICD-10-CM | POA: Diagnosis not present

## 2018-08-07 DIAGNOSIS — D631 Anemia in chronic kidney disease: Secondary | ICD-10-CM | POA: Diagnosis not present

## 2018-08-07 DIAGNOSIS — N2581 Secondary hyperparathyroidism of renal origin: Secondary | ICD-10-CM | POA: Diagnosis not present

## 2018-08-09 DIAGNOSIS — N186 End stage renal disease: Secondary | ICD-10-CM | POA: Diagnosis not present

## 2018-08-09 DIAGNOSIS — N2581 Secondary hyperparathyroidism of renal origin: Secondary | ICD-10-CM | POA: Diagnosis not present

## 2018-08-09 DIAGNOSIS — D631 Anemia in chronic kidney disease: Secondary | ICD-10-CM | POA: Diagnosis not present

## 2018-08-09 DIAGNOSIS — Z23 Encounter for immunization: Secondary | ICD-10-CM | POA: Diagnosis not present

## 2018-08-09 DIAGNOSIS — E1122 Type 2 diabetes mellitus with diabetic chronic kidney disease: Secondary | ICD-10-CM | POA: Diagnosis not present

## 2018-08-10 ENCOUNTER — Other Ambulatory Visit: Payer: Self-pay | Admitting: Family Medicine

## 2018-08-10 ENCOUNTER — Other Ambulatory Visit: Payer: Self-pay

## 2018-08-10 MED ORDER — LOSARTAN POTASSIUM 50 MG PO TABS: 50.0000 mg | ORAL_TABLET | Freq: Every day | ORAL | 3 refills | Status: DC

## 2018-08-10 MED ORDER — INSULIN PEN NEEDLE 32G X 4 MM MISC: 1.0000 | Freq: Three times a day (TID) | 11 refills | Status: DC

## 2018-08-10 MED ORDER — METOPROLOL TARTRATE 50 MG PO TABS: ORAL_TABLET | ORAL | 3 refills | Status: DC

## 2018-08-11 DIAGNOSIS — Z23 Encounter for immunization: Secondary | ICD-10-CM | POA: Diagnosis not present

## 2018-08-11 DIAGNOSIS — N2581 Secondary hyperparathyroidism of renal origin: Secondary | ICD-10-CM | POA: Diagnosis not present

## 2018-08-11 DIAGNOSIS — N186 End stage renal disease: Secondary | ICD-10-CM | POA: Diagnosis not present

## 2018-08-11 DIAGNOSIS — D631 Anemia in chronic kidney disease: Secondary | ICD-10-CM | POA: Diagnosis not present

## 2018-08-11 DIAGNOSIS — E1122 Type 2 diabetes mellitus with diabetic chronic kidney disease: Secondary | ICD-10-CM | POA: Diagnosis not present

## 2018-08-14 DIAGNOSIS — E1122 Type 2 diabetes mellitus with diabetic chronic kidney disease: Secondary | ICD-10-CM | POA: Diagnosis not present

## 2018-08-14 DIAGNOSIS — Z23 Encounter for immunization: Secondary | ICD-10-CM | POA: Diagnosis not present

## 2018-08-14 DIAGNOSIS — D631 Anemia in chronic kidney disease: Secondary | ICD-10-CM | POA: Diagnosis not present

## 2018-08-14 DIAGNOSIS — N186 End stage renal disease: Secondary | ICD-10-CM | POA: Diagnosis not present

## 2018-08-14 DIAGNOSIS — N2581 Secondary hyperparathyroidism of renal origin: Secondary | ICD-10-CM | POA: Diagnosis not present

## 2018-08-16 DIAGNOSIS — N186 End stage renal disease: Secondary | ICD-10-CM | POA: Diagnosis not present

## 2018-08-16 DIAGNOSIS — Z23 Encounter for immunization: Secondary | ICD-10-CM | POA: Diagnosis not present

## 2018-08-16 DIAGNOSIS — N2581 Secondary hyperparathyroidism of renal origin: Secondary | ICD-10-CM | POA: Diagnosis not present

## 2018-08-16 DIAGNOSIS — D631 Anemia in chronic kidney disease: Secondary | ICD-10-CM | POA: Diagnosis not present

## 2018-08-16 DIAGNOSIS — E1122 Type 2 diabetes mellitus with diabetic chronic kidney disease: Secondary | ICD-10-CM | POA: Diagnosis not present

## 2018-08-18 DIAGNOSIS — N2581 Secondary hyperparathyroidism of renal origin: Secondary | ICD-10-CM | POA: Diagnosis not present

## 2018-08-18 DIAGNOSIS — E1122 Type 2 diabetes mellitus with diabetic chronic kidney disease: Secondary | ICD-10-CM | POA: Diagnosis not present

## 2018-08-18 DIAGNOSIS — D631 Anemia in chronic kidney disease: Secondary | ICD-10-CM | POA: Diagnosis not present

## 2018-08-18 DIAGNOSIS — Z23 Encounter for immunization: Secondary | ICD-10-CM | POA: Diagnosis not present

## 2018-08-18 DIAGNOSIS — N186 End stage renal disease: Secondary | ICD-10-CM | POA: Diagnosis not present

## 2018-08-21 DIAGNOSIS — N186 End stage renal disease: Secondary | ICD-10-CM | POA: Diagnosis not present

## 2018-08-21 DIAGNOSIS — D631 Anemia in chronic kidney disease: Secondary | ICD-10-CM | POA: Diagnosis not present

## 2018-08-21 DIAGNOSIS — N2581 Secondary hyperparathyroidism of renal origin: Secondary | ICD-10-CM | POA: Diagnosis not present

## 2018-08-21 DIAGNOSIS — Z23 Encounter for immunization: Secondary | ICD-10-CM | POA: Diagnosis not present

## 2018-08-21 DIAGNOSIS — E1122 Type 2 diabetes mellitus with diabetic chronic kidney disease: Secondary | ICD-10-CM | POA: Diagnosis not present

## 2018-08-23 DIAGNOSIS — D631 Anemia in chronic kidney disease: Secondary | ICD-10-CM | POA: Diagnosis not present

## 2018-08-23 DIAGNOSIS — Z23 Encounter for immunization: Secondary | ICD-10-CM | POA: Diagnosis not present

## 2018-08-23 DIAGNOSIS — N186 End stage renal disease: Secondary | ICD-10-CM | POA: Diagnosis not present

## 2018-08-23 DIAGNOSIS — E1122 Type 2 diabetes mellitus with diabetic chronic kidney disease: Secondary | ICD-10-CM | POA: Diagnosis not present

## 2018-08-23 DIAGNOSIS — N2581 Secondary hyperparathyroidism of renal origin: Secondary | ICD-10-CM | POA: Diagnosis not present

## 2018-08-25 DIAGNOSIS — N2581 Secondary hyperparathyroidism of renal origin: Secondary | ICD-10-CM | POA: Diagnosis not present

## 2018-08-25 DIAGNOSIS — N186 End stage renal disease: Secondary | ICD-10-CM | POA: Diagnosis not present

## 2018-08-25 DIAGNOSIS — E1122 Type 2 diabetes mellitus with diabetic chronic kidney disease: Secondary | ICD-10-CM | POA: Diagnosis not present

## 2018-08-25 DIAGNOSIS — D631 Anemia in chronic kidney disease: Secondary | ICD-10-CM | POA: Diagnosis not present

## 2018-08-25 DIAGNOSIS — Z23 Encounter for immunization: Secondary | ICD-10-CM | POA: Diagnosis not present

## 2018-08-28 DIAGNOSIS — Z23 Encounter for immunization: Secondary | ICD-10-CM | POA: Diagnosis not present

## 2018-08-28 DIAGNOSIS — N2581 Secondary hyperparathyroidism of renal origin: Secondary | ICD-10-CM | POA: Diagnosis not present

## 2018-08-28 DIAGNOSIS — D631 Anemia in chronic kidney disease: Secondary | ICD-10-CM | POA: Diagnosis not present

## 2018-08-28 DIAGNOSIS — E1122 Type 2 diabetes mellitus with diabetic chronic kidney disease: Secondary | ICD-10-CM | POA: Diagnosis not present

## 2018-08-28 DIAGNOSIS — N186 End stage renal disease: Secondary | ICD-10-CM | POA: Diagnosis not present

## 2018-08-30 DIAGNOSIS — E1122 Type 2 diabetes mellitus with diabetic chronic kidney disease: Secondary | ICD-10-CM | POA: Diagnosis not present

## 2018-08-30 DIAGNOSIS — D631 Anemia in chronic kidney disease: Secondary | ICD-10-CM | POA: Diagnosis not present

## 2018-08-30 DIAGNOSIS — Z23 Encounter for immunization: Secondary | ICD-10-CM | POA: Diagnosis not present

## 2018-08-30 DIAGNOSIS — N186 End stage renal disease: Secondary | ICD-10-CM | POA: Diagnosis not present

## 2018-08-30 DIAGNOSIS — N2581 Secondary hyperparathyroidism of renal origin: Secondary | ICD-10-CM | POA: Diagnosis not present

## 2018-09-01 DIAGNOSIS — Z23 Encounter for immunization: Secondary | ICD-10-CM | POA: Diagnosis not present

## 2018-09-01 DIAGNOSIS — E1122 Type 2 diabetes mellitus with diabetic chronic kidney disease: Secondary | ICD-10-CM | POA: Diagnosis not present

## 2018-09-01 DIAGNOSIS — N186 End stage renal disease: Secondary | ICD-10-CM | POA: Diagnosis not present

## 2018-09-01 DIAGNOSIS — D631 Anemia in chronic kidney disease: Secondary | ICD-10-CM | POA: Diagnosis not present

## 2018-09-01 DIAGNOSIS — N2581 Secondary hyperparathyroidism of renal origin: Secondary | ICD-10-CM | POA: Diagnosis not present

## 2018-09-04 DIAGNOSIS — E1122 Type 2 diabetes mellitus with diabetic chronic kidney disease: Secondary | ICD-10-CM | POA: Diagnosis not present

## 2018-09-04 DIAGNOSIS — D631 Anemia in chronic kidney disease: Secondary | ICD-10-CM | POA: Diagnosis not present

## 2018-09-04 DIAGNOSIS — Z23 Encounter for immunization: Secondary | ICD-10-CM | POA: Diagnosis not present

## 2018-09-04 DIAGNOSIS — N2581 Secondary hyperparathyroidism of renal origin: Secondary | ICD-10-CM | POA: Diagnosis not present

## 2018-09-04 DIAGNOSIS — N186 End stage renal disease: Secondary | ICD-10-CM | POA: Diagnosis not present

## 2018-09-05 DIAGNOSIS — Z992 Dependence on renal dialysis: Secondary | ICD-10-CM | POA: Diagnosis not present

## 2018-09-05 DIAGNOSIS — E1122 Type 2 diabetes mellitus with diabetic chronic kidney disease: Secondary | ICD-10-CM | POA: Diagnosis not present

## 2018-09-05 DIAGNOSIS — N186 End stage renal disease: Secondary | ICD-10-CM | POA: Diagnosis not present

## 2018-09-06 DIAGNOSIS — N2581 Secondary hyperparathyroidism of renal origin: Secondary | ICD-10-CM | POA: Diagnosis not present

## 2018-09-06 DIAGNOSIS — N186 End stage renal disease: Secondary | ICD-10-CM | POA: Diagnosis not present

## 2018-09-06 DIAGNOSIS — D509 Iron deficiency anemia, unspecified: Secondary | ICD-10-CM | POA: Diagnosis not present

## 2018-09-06 DIAGNOSIS — E1122 Type 2 diabetes mellitus with diabetic chronic kidney disease: Secondary | ICD-10-CM | POA: Diagnosis not present

## 2018-09-06 DIAGNOSIS — D631 Anemia in chronic kidney disease: Secondary | ICD-10-CM | POA: Diagnosis not present

## 2018-09-08 DIAGNOSIS — E1122 Type 2 diabetes mellitus with diabetic chronic kidney disease: Secondary | ICD-10-CM | POA: Diagnosis not present

## 2018-09-08 DIAGNOSIS — D509 Iron deficiency anemia, unspecified: Secondary | ICD-10-CM | POA: Diagnosis not present

## 2018-09-08 DIAGNOSIS — N2581 Secondary hyperparathyroidism of renal origin: Secondary | ICD-10-CM | POA: Diagnosis not present

## 2018-09-08 DIAGNOSIS — D631 Anemia in chronic kidney disease: Secondary | ICD-10-CM | POA: Diagnosis not present

## 2018-09-08 DIAGNOSIS — N186 End stage renal disease: Secondary | ICD-10-CM | POA: Diagnosis not present

## 2018-09-11 DIAGNOSIS — N186 End stage renal disease: Secondary | ICD-10-CM | POA: Diagnosis not present

## 2018-09-11 DIAGNOSIS — D631 Anemia in chronic kidney disease: Secondary | ICD-10-CM | POA: Diagnosis not present

## 2018-09-11 DIAGNOSIS — D509 Iron deficiency anemia, unspecified: Secondary | ICD-10-CM | POA: Diagnosis not present

## 2018-09-11 DIAGNOSIS — E1122 Type 2 diabetes mellitus with diabetic chronic kidney disease: Secondary | ICD-10-CM | POA: Diagnosis not present

## 2018-09-11 DIAGNOSIS — N2581 Secondary hyperparathyroidism of renal origin: Secondary | ICD-10-CM | POA: Diagnosis not present

## 2018-09-13 DIAGNOSIS — D631 Anemia in chronic kidney disease: Secondary | ICD-10-CM | POA: Diagnosis not present

## 2018-09-13 DIAGNOSIS — D509 Iron deficiency anemia, unspecified: Secondary | ICD-10-CM | POA: Diagnosis not present

## 2018-09-13 DIAGNOSIS — E1122 Type 2 diabetes mellitus with diabetic chronic kidney disease: Secondary | ICD-10-CM | POA: Diagnosis not present

## 2018-09-13 DIAGNOSIS — N186 End stage renal disease: Secondary | ICD-10-CM | POA: Diagnosis not present

## 2018-09-13 DIAGNOSIS — N2581 Secondary hyperparathyroidism of renal origin: Secondary | ICD-10-CM | POA: Diagnosis not present

## 2018-09-15 DIAGNOSIS — D509 Iron deficiency anemia, unspecified: Secondary | ICD-10-CM | POA: Diagnosis not present

## 2018-09-15 DIAGNOSIS — N186 End stage renal disease: Secondary | ICD-10-CM | POA: Diagnosis not present

## 2018-09-15 DIAGNOSIS — N2581 Secondary hyperparathyroidism of renal origin: Secondary | ICD-10-CM | POA: Diagnosis not present

## 2018-09-15 DIAGNOSIS — E1122 Type 2 diabetes mellitus with diabetic chronic kidney disease: Secondary | ICD-10-CM | POA: Diagnosis not present

## 2018-09-15 DIAGNOSIS — D631 Anemia in chronic kidney disease: Secondary | ICD-10-CM | POA: Diagnosis not present

## 2018-09-18 DIAGNOSIS — D509 Iron deficiency anemia, unspecified: Secondary | ICD-10-CM | POA: Diagnosis not present

## 2018-09-18 DIAGNOSIS — N2581 Secondary hyperparathyroidism of renal origin: Secondary | ICD-10-CM | POA: Diagnosis not present

## 2018-09-18 DIAGNOSIS — E1122 Type 2 diabetes mellitus with diabetic chronic kidney disease: Secondary | ICD-10-CM | POA: Diagnosis not present

## 2018-09-18 DIAGNOSIS — N186 End stage renal disease: Secondary | ICD-10-CM | POA: Diagnosis not present

## 2018-09-18 DIAGNOSIS — D631 Anemia in chronic kidney disease: Secondary | ICD-10-CM | POA: Diagnosis not present

## 2018-09-20 DIAGNOSIS — D631 Anemia in chronic kidney disease: Secondary | ICD-10-CM | POA: Diagnosis not present

## 2018-09-20 DIAGNOSIS — N2581 Secondary hyperparathyroidism of renal origin: Secondary | ICD-10-CM | POA: Diagnosis not present

## 2018-09-20 DIAGNOSIS — N186 End stage renal disease: Secondary | ICD-10-CM | POA: Diagnosis not present

## 2018-09-20 DIAGNOSIS — D509 Iron deficiency anemia, unspecified: Secondary | ICD-10-CM | POA: Diagnosis not present

## 2018-09-20 DIAGNOSIS — E1122 Type 2 diabetes mellitus with diabetic chronic kidney disease: Secondary | ICD-10-CM | POA: Diagnosis not present

## 2018-09-22 DIAGNOSIS — D509 Iron deficiency anemia, unspecified: Secondary | ICD-10-CM | POA: Diagnosis not present

## 2018-09-22 DIAGNOSIS — N186 End stage renal disease: Secondary | ICD-10-CM | POA: Diagnosis not present

## 2018-09-22 DIAGNOSIS — D631 Anemia in chronic kidney disease: Secondary | ICD-10-CM | POA: Diagnosis not present

## 2018-09-22 DIAGNOSIS — N2581 Secondary hyperparathyroidism of renal origin: Secondary | ICD-10-CM | POA: Diagnosis not present

## 2018-09-22 DIAGNOSIS — E1122 Type 2 diabetes mellitus with diabetic chronic kidney disease: Secondary | ICD-10-CM | POA: Diagnosis not present

## 2018-09-25 DIAGNOSIS — D631 Anemia in chronic kidney disease: Secondary | ICD-10-CM | POA: Diagnosis not present

## 2018-09-25 DIAGNOSIS — D509 Iron deficiency anemia, unspecified: Secondary | ICD-10-CM | POA: Diagnosis not present

## 2018-09-25 DIAGNOSIS — N2581 Secondary hyperparathyroidism of renal origin: Secondary | ICD-10-CM | POA: Diagnosis not present

## 2018-09-25 DIAGNOSIS — N186 End stage renal disease: Secondary | ICD-10-CM | POA: Diagnosis not present

## 2018-09-25 DIAGNOSIS — E1122 Type 2 diabetes mellitus with diabetic chronic kidney disease: Secondary | ICD-10-CM | POA: Diagnosis not present

## 2018-09-27 DIAGNOSIS — N2581 Secondary hyperparathyroidism of renal origin: Secondary | ICD-10-CM | POA: Diagnosis not present

## 2018-09-27 DIAGNOSIS — D631 Anemia in chronic kidney disease: Secondary | ICD-10-CM | POA: Diagnosis not present

## 2018-09-27 DIAGNOSIS — E1122 Type 2 diabetes mellitus with diabetic chronic kidney disease: Secondary | ICD-10-CM | POA: Diagnosis not present

## 2018-09-27 DIAGNOSIS — N186 End stage renal disease: Secondary | ICD-10-CM | POA: Diagnosis not present

## 2018-09-27 DIAGNOSIS — D509 Iron deficiency anemia, unspecified: Secondary | ICD-10-CM | POA: Diagnosis not present

## 2018-09-29 DIAGNOSIS — N186 End stage renal disease: Secondary | ICD-10-CM | POA: Diagnosis not present

## 2018-09-29 DIAGNOSIS — N2581 Secondary hyperparathyroidism of renal origin: Secondary | ICD-10-CM | POA: Diagnosis not present

## 2018-09-29 DIAGNOSIS — D631 Anemia in chronic kidney disease: Secondary | ICD-10-CM | POA: Diagnosis not present

## 2018-09-29 DIAGNOSIS — D509 Iron deficiency anemia, unspecified: Secondary | ICD-10-CM | POA: Diagnosis not present

## 2018-09-29 DIAGNOSIS — E1122 Type 2 diabetes mellitus with diabetic chronic kidney disease: Secondary | ICD-10-CM | POA: Diagnosis not present

## 2018-10-02 DIAGNOSIS — D509 Iron deficiency anemia, unspecified: Secondary | ICD-10-CM | POA: Diagnosis not present

## 2018-10-02 DIAGNOSIS — N186 End stage renal disease: Secondary | ICD-10-CM | POA: Diagnosis not present

## 2018-10-02 DIAGNOSIS — N2581 Secondary hyperparathyroidism of renal origin: Secondary | ICD-10-CM | POA: Diagnosis not present

## 2018-10-02 DIAGNOSIS — E1122 Type 2 diabetes mellitus with diabetic chronic kidney disease: Secondary | ICD-10-CM | POA: Diagnosis not present

## 2018-10-02 DIAGNOSIS — D631 Anemia in chronic kidney disease: Secondary | ICD-10-CM | POA: Diagnosis not present

## 2018-10-04 DIAGNOSIS — D509 Iron deficiency anemia, unspecified: Secondary | ICD-10-CM | POA: Diagnosis not present

## 2018-10-04 DIAGNOSIS — N2581 Secondary hyperparathyroidism of renal origin: Secondary | ICD-10-CM | POA: Diagnosis not present

## 2018-10-04 DIAGNOSIS — D631 Anemia in chronic kidney disease: Secondary | ICD-10-CM | POA: Diagnosis not present

## 2018-10-04 DIAGNOSIS — E1122 Type 2 diabetes mellitus with diabetic chronic kidney disease: Secondary | ICD-10-CM | POA: Diagnosis not present

## 2018-10-04 DIAGNOSIS — N186 End stage renal disease: Secondary | ICD-10-CM | POA: Diagnosis not present

## 2018-10-06 DIAGNOSIS — N2581 Secondary hyperparathyroidism of renal origin: Secondary | ICD-10-CM | POA: Diagnosis not present

## 2018-10-06 DIAGNOSIS — E1122 Type 2 diabetes mellitus with diabetic chronic kidney disease: Secondary | ICD-10-CM | POA: Diagnosis not present

## 2018-10-06 DIAGNOSIS — D631 Anemia in chronic kidney disease: Secondary | ICD-10-CM | POA: Diagnosis not present

## 2018-10-06 DIAGNOSIS — Z992 Dependence on renal dialysis: Secondary | ICD-10-CM | POA: Diagnosis not present

## 2018-10-06 DIAGNOSIS — N186 End stage renal disease: Secondary | ICD-10-CM | POA: Diagnosis not present

## 2018-10-06 DIAGNOSIS — D509 Iron deficiency anemia, unspecified: Secondary | ICD-10-CM | POA: Diagnosis not present

## 2018-10-09 DIAGNOSIS — D631 Anemia in chronic kidney disease: Secondary | ICD-10-CM | POA: Diagnosis not present

## 2018-10-09 DIAGNOSIS — N186 End stage renal disease: Secondary | ICD-10-CM | POA: Diagnosis not present

## 2018-10-09 DIAGNOSIS — N2581 Secondary hyperparathyroidism of renal origin: Secondary | ICD-10-CM | POA: Diagnosis not present

## 2018-10-09 DIAGNOSIS — D509 Iron deficiency anemia, unspecified: Secondary | ICD-10-CM | POA: Diagnosis not present

## 2018-10-09 DIAGNOSIS — E1122 Type 2 diabetes mellitus with diabetic chronic kidney disease: Secondary | ICD-10-CM | POA: Diagnosis not present

## 2018-10-11 ENCOUNTER — Other Ambulatory Visit: Payer: Self-pay | Admitting: Family Medicine

## 2018-10-11 DIAGNOSIS — N186 End stage renal disease: Secondary | ICD-10-CM | POA: Diagnosis not present

## 2018-10-11 DIAGNOSIS — D631 Anemia in chronic kidney disease: Secondary | ICD-10-CM | POA: Diagnosis not present

## 2018-10-11 DIAGNOSIS — N2581 Secondary hyperparathyroidism of renal origin: Secondary | ICD-10-CM | POA: Diagnosis not present

## 2018-10-11 DIAGNOSIS — D509 Iron deficiency anemia, unspecified: Secondary | ICD-10-CM | POA: Diagnosis not present

## 2018-10-11 DIAGNOSIS — E1122 Type 2 diabetes mellitus with diabetic chronic kidney disease: Secondary | ICD-10-CM | POA: Diagnosis not present

## 2018-10-12 NOTE — Telephone Encounter (Signed)
Refilled patient's meds. Please have her make an appointment to follow up on diabetes and health maintenance items.

## 2018-10-13 DIAGNOSIS — N2581 Secondary hyperparathyroidism of renal origin: Secondary | ICD-10-CM | POA: Diagnosis not present

## 2018-10-13 DIAGNOSIS — D631 Anemia in chronic kidney disease: Secondary | ICD-10-CM | POA: Diagnosis not present

## 2018-10-13 DIAGNOSIS — E1122 Type 2 diabetes mellitus with diabetic chronic kidney disease: Secondary | ICD-10-CM | POA: Diagnosis not present

## 2018-10-13 DIAGNOSIS — D509 Iron deficiency anemia, unspecified: Secondary | ICD-10-CM | POA: Diagnosis not present

## 2018-10-13 DIAGNOSIS — N186 End stage renal disease: Secondary | ICD-10-CM | POA: Diagnosis not present

## 2018-10-16 DIAGNOSIS — N2581 Secondary hyperparathyroidism of renal origin: Secondary | ICD-10-CM | POA: Diagnosis not present

## 2018-10-16 DIAGNOSIS — N186 End stage renal disease: Secondary | ICD-10-CM | POA: Diagnosis not present

## 2018-10-16 DIAGNOSIS — D509 Iron deficiency anemia, unspecified: Secondary | ICD-10-CM | POA: Diagnosis not present

## 2018-10-16 DIAGNOSIS — E1122 Type 2 diabetes mellitus with diabetic chronic kidney disease: Secondary | ICD-10-CM | POA: Diagnosis not present

## 2018-10-16 DIAGNOSIS — D631 Anemia in chronic kidney disease: Secondary | ICD-10-CM | POA: Diagnosis not present

## 2018-10-18 DIAGNOSIS — E1122 Type 2 diabetes mellitus with diabetic chronic kidney disease: Secondary | ICD-10-CM | POA: Diagnosis not present

## 2018-10-18 DIAGNOSIS — N186 End stage renal disease: Secondary | ICD-10-CM | POA: Diagnosis not present

## 2018-10-18 DIAGNOSIS — N2581 Secondary hyperparathyroidism of renal origin: Secondary | ICD-10-CM | POA: Diagnosis not present

## 2018-10-18 DIAGNOSIS — D631 Anemia in chronic kidney disease: Secondary | ICD-10-CM | POA: Diagnosis not present

## 2018-10-18 DIAGNOSIS — D509 Iron deficiency anemia, unspecified: Secondary | ICD-10-CM | POA: Diagnosis not present

## 2018-10-19 DIAGNOSIS — E113593 Type 2 diabetes mellitus with proliferative diabetic retinopathy without macular edema, bilateral: Secondary | ICD-10-CM | POA: Diagnosis not present

## 2018-10-20 DIAGNOSIS — D509 Iron deficiency anemia, unspecified: Secondary | ICD-10-CM | POA: Diagnosis not present

## 2018-10-20 DIAGNOSIS — N2581 Secondary hyperparathyroidism of renal origin: Secondary | ICD-10-CM | POA: Diagnosis not present

## 2018-10-20 DIAGNOSIS — E1122 Type 2 diabetes mellitus with diabetic chronic kidney disease: Secondary | ICD-10-CM | POA: Diagnosis not present

## 2018-10-20 DIAGNOSIS — N186 End stage renal disease: Secondary | ICD-10-CM | POA: Diagnosis not present

## 2018-10-20 DIAGNOSIS — D631 Anemia in chronic kidney disease: Secondary | ICD-10-CM | POA: Diagnosis not present

## 2018-10-23 DIAGNOSIS — D631 Anemia in chronic kidney disease: Secondary | ICD-10-CM | POA: Diagnosis not present

## 2018-10-23 DIAGNOSIS — N2581 Secondary hyperparathyroidism of renal origin: Secondary | ICD-10-CM | POA: Diagnosis not present

## 2018-10-23 DIAGNOSIS — E1122 Type 2 diabetes mellitus with diabetic chronic kidney disease: Secondary | ICD-10-CM | POA: Diagnosis not present

## 2018-10-23 DIAGNOSIS — N186 End stage renal disease: Secondary | ICD-10-CM | POA: Diagnosis not present

## 2018-10-23 DIAGNOSIS — D509 Iron deficiency anemia, unspecified: Secondary | ICD-10-CM | POA: Diagnosis not present

## 2018-10-25 DIAGNOSIS — E1122 Type 2 diabetes mellitus with diabetic chronic kidney disease: Secondary | ICD-10-CM | POA: Diagnosis not present

## 2018-10-25 DIAGNOSIS — D509 Iron deficiency anemia, unspecified: Secondary | ICD-10-CM | POA: Diagnosis not present

## 2018-10-25 DIAGNOSIS — D631 Anemia in chronic kidney disease: Secondary | ICD-10-CM | POA: Diagnosis not present

## 2018-10-25 DIAGNOSIS — N186 End stage renal disease: Secondary | ICD-10-CM | POA: Diagnosis not present

## 2018-10-25 DIAGNOSIS — N2581 Secondary hyperparathyroidism of renal origin: Secondary | ICD-10-CM | POA: Diagnosis not present

## 2018-10-26 ENCOUNTER — Ambulatory Visit (INDEPENDENT_AMBULATORY_CARE_PROVIDER_SITE_OTHER): Payer: Medicare Other | Admitting: Family Medicine

## 2018-10-26 ENCOUNTER — Other Ambulatory Visit: Payer: Self-pay

## 2018-10-26 ENCOUNTER — Encounter: Payer: Self-pay | Admitting: Family Medicine

## 2018-10-26 VITALS — BP 150/72 | HR 83 | Temp 98.5°F | Ht 67.0 in | Wt 205.8 lb

## 2018-10-26 DIAGNOSIS — Z794 Long term (current) use of insulin: Secondary | ICD-10-CM | POA: Diagnosis not present

## 2018-10-26 DIAGNOSIS — Z1211 Encounter for screening for malignant neoplasm of colon: Secondary | ICD-10-CM

## 2018-10-26 DIAGNOSIS — E1139 Type 2 diabetes mellitus with other diabetic ophthalmic complication: Secondary | ICD-10-CM | POA: Diagnosis not present

## 2018-10-26 LAB — POCT GLYCOSYLATED HEMOGLOBIN (HGB A1C): HbA1c, POC (controlled diabetic range): 7.7 % — AB (ref 0.0–7.0)

## 2018-10-26 MED ORDER — GLUCOSE BLOOD VI STRP: ORAL_STRIP | 12 refills | Status: DC

## 2018-10-26 MED ORDER — PRODIGY LANCETS 28G MISC: 1.0000 | Freq: Three times a day (TID) | 3 refills | Status: DC

## 2018-10-26 MED ORDER — PRODIGY AUTOCODE BLOOD GLUCOSE W/DEVICE KIT: 1.0000 | PACK | Freq: Three times a day (TID) | 0 refills | Status: DC

## 2018-10-26 NOTE — Patient Instructions (Signed)
It was great to see you!  Our plans for today:  - No changes to medication regimen today.  - I ordered a Prodigy glucometer, use this 3 times daily - follow up in one month  Take care and seek immediate care sooner if you develop any concerns.   Dr. [facility]   Diet Recommendations for Diabetes   1. Eat at least 3 meals and 1-2 snacks per day. Never go more than 4-5 hours while awake without eating. Eat breakfast within the first hour of getting up.   2. Limit starchy foods to TWO per meal and ONE per snack. ONE portion of a starchy  food is equal to the following:   - ONE slice of bread (or its equivalent, such as half of a hamburger bun).   - 1/2 cup of a "scoopable" starchy food such as potatoes or rice.   - 15 grams of Total Carbohydrate as shown on food label.  3. Include at every meal: a protein food, a carb food, and vegetables and/or fruit.   - Obtain twice the volume of vegetables as protein or carbohydrate foods for both lunch and dinner.   - Fresh or frozen vegetables are best.   - Keep frozen vegetables on hand for a quick vegetable serving.       Starchy (carb) foods: Bread, rice, pasta, potatoes, corn, cereal, grits, crackers, bagels, muffins, all baked goods.  (Fruits, milk, and yogurt also have carbohydrate, but most of these foods will not spike your blood sugar as most starchy foods will.)  A few fruits do cause high blood sugars; use small portions of bananas (limit to 1/2 at a time), grapes, watermelon, oranges, and most tropical fruits.    Protein foods: Meat, fish, poultry, eggs, dairy foods, and beans such as pinto and kidney beans (beans also provide carbohydrate).

## 2018-10-26 NOTE — Assessment & Plan Note (Signed)
A1c worsened from previous however with symptoms of hypoglycemia. No medication changes today, however will prescribe Prodigy glucometer for patient to check CBGs with instructions to check sugars whenever she feels jittery. Follow up in one month. May benefit from ambulatory CGM.

## 2018-10-26 NOTE — Progress Notes (Signed)
  Subjective:   Patient ID: [name]    DOB: [birth year], 52 y.o. female   MRN: [id number]  [name] is a 52 y.o. female with a history of HTN, DM2, ESRD on HD MWF, HLD here for   Diabetes, Type 2 - Last A1c 6.8 05/2018. Was 7-8 at HD about a month ago. - Medications: Humulin 70/30 3u BID - Compliance: fair, sometimes will miss days about 2x per week, today did 5u this morning - Checking BG at home: doesn't currently have meter (can tell when CBGs too low, will get jittery and eat sweets). Needs meter that will talk to her as she is blind. - Diet: eats baked chicken, pork chops, hamburger. Cabbage, rice, pinto beans, green beans. Gets vegetables with each meal.  - Exercise: wheelchair bound - Eye exam: due, went 11/14, [place] exam: UTD - Sometimes will feel "jittery" after taking insulin, ~3-4x per week - h/o previous amputations, R AKA and R first finger  Healthcare Maintenance - Vaccines: UTD - Colonoscopy: due - Mammogram: ordered, due - Pap Smear: due - DEXA Scan: N/A  Review of Systems:  Per HPI.  [place], medications and smoking status reviewed.  Objective:   BP (!) 150/72   Pulse 83   Temp 98.5 F (36.9 C) (Oral)   Ht '5\' 7"'$  (1.702 m)   Wt 205 lb 12.8 oz (93.4 kg)   SpO2 96%   BMI 32.23 kg/m  Vitals and nursing note reviewed.  General: obese female, in no acute distress with non-toxic appearance, wheelchair bound HEENT: normocephalic, atraumatic, moist mucous membranes CV: regular rate and rhythm without murmurs, rubs, or gallops Lungs: clear to auscultation bilaterally with normal work of breathing Skin: warm, dry, no rashes or lesions Extremities: warm and well perfused, normal tone Neuro: Alert and oriented, speech normal  Assessment & Plan:   Type 2 diabetes mellitus (HCC) A1c worsened from previous however with symptoms of hypoglycemia. No medication changes today, however will prescribe Prodigy glucometer for patient to check CBGs  with instructions to check sugars whenever she feels jittery. Follow up in one month. May benefit from ambulatory CGM.   Orders Placed This Encounter  Procedures  . Ambulatory referral to Gastroenterology    Referral Priority:   Routine    Referral Type:   Consultation    Referral Reason:   Specialty Services Required    Number of Visits Requested:   1  . POCT glycosylated hemoglobin (Hb A1C)   Meds ordered this encounter  Medications  . Blood Glucose Monitoring Suppl (PRODIGY AUTOCODE BLOOD GLUCOSE) w/Device KIT    Sig: 1 Device by Does not apply route 3 (three) times daily.    Dispense:  1 each    Refill:  0  . PRODIGY LANCETS 28G MISC    Sig: 1 each by Does not apply route 3 (three) times daily.    Dispense:  100 each    Refill:  3  . glucose blood test strip    Sig: Use as instructed    Dispense:  100 each    Refill:  [place]on, DO PGY-2, [facility] 10/26/2018 10:21 PM

## 2018-10-27 DIAGNOSIS — D631 Anemia in chronic kidney disease: Secondary | ICD-10-CM | POA: Diagnosis not present

## 2018-10-27 DIAGNOSIS — E1122 Type 2 diabetes mellitus with diabetic chronic kidney disease: Secondary | ICD-10-CM | POA: Diagnosis not present

## 2018-10-27 DIAGNOSIS — N2581 Secondary hyperparathyroidism of renal origin: Secondary | ICD-10-CM | POA: Diagnosis not present

## 2018-10-27 DIAGNOSIS — N186 End stage renal disease: Secondary | ICD-10-CM | POA: Diagnosis not present

## 2018-10-27 DIAGNOSIS — D509 Iron deficiency anemia, unspecified: Secondary | ICD-10-CM | POA: Diagnosis not present

## 2018-10-29 DIAGNOSIS — E1122 Type 2 diabetes mellitus with diabetic chronic kidney disease: Secondary | ICD-10-CM | POA: Diagnosis not present

## 2018-10-29 DIAGNOSIS — D631 Anemia in chronic kidney disease: Secondary | ICD-10-CM | POA: Diagnosis not present

## 2018-10-29 DIAGNOSIS — N2581 Secondary hyperparathyroidism of renal origin: Secondary | ICD-10-CM | POA: Diagnosis not present

## 2018-10-29 DIAGNOSIS — D509 Iron deficiency anemia, unspecified: Secondary | ICD-10-CM | POA: Diagnosis not present

## 2018-10-29 DIAGNOSIS — N186 End stage renal disease: Secondary | ICD-10-CM | POA: Diagnosis not present

## 2018-10-31 DIAGNOSIS — D631 Anemia in chronic kidney disease: Secondary | ICD-10-CM | POA: Diagnosis not present

## 2018-10-31 DIAGNOSIS — N2581 Secondary hyperparathyroidism of renal origin: Secondary | ICD-10-CM | POA: Diagnosis not present

## 2018-10-31 DIAGNOSIS — D509 Iron deficiency anemia, unspecified: Secondary | ICD-10-CM | POA: Diagnosis not present

## 2018-10-31 DIAGNOSIS — E1122 Type 2 diabetes mellitus with diabetic chronic kidney disease: Secondary | ICD-10-CM | POA: Diagnosis not present

## 2018-10-31 DIAGNOSIS — N186 End stage renal disease: Secondary | ICD-10-CM | POA: Diagnosis not present

## 2018-11-01 ENCOUNTER — Other Ambulatory Visit: Payer: Self-pay | Admitting: Internal Medicine

## 2018-11-01 ENCOUNTER — Other Ambulatory Visit: Payer: Self-pay | Admitting: Family Medicine

## 2018-11-03 DIAGNOSIS — E1122 Type 2 diabetes mellitus with diabetic chronic kidney disease: Secondary | ICD-10-CM | POA: Diagnosis not present

## 2018-11-03 DIAGNOSIS — N186 End stage renal disease: Secondary | ICD-10-CM | POA: Diagnosis not present

## 2018-11-03 DIAGNOSIS — D509 Iron deficiency anemia, unspecified: Secondary | ICD-10-CM | POA: Diagnosis not present

## 2018-11-03 DIAGNOSIS — D631 Anemia in chronic kidney disease: Secondary | ICD-10-CM | POA: Diagnosis not present

## 2018-11-03 DIAGNOSIS — N2581 Secondary hyperparathyroidism of renal origin: Secondary | ICD-10-CM | POA: Diagnosis not present

## 2018-11-05 DIAGNOSIS — Z992 Dependence on renal dialysis: Secondary | ICD-10-CM | POA: Diagnosis not present

## 2018-11-05 DIAGNOSIS — E1122 Type 2 diabetes mellitus with diabetic chronic kidney disease: Secondary | ICD-10-CM | POA: Diagnosis not present

## 2018-11-05 DIAGNOSIS — N186 End stage renal disease: Secondary | ICD-10-CM | POA: Diagnosis not present

## 2018-11-06 DIAGNOSIS — D631 Anemia in chronic kidney disease: Secondary | ICD-10-CM | POA: Diagnosis not present

## 2018-11-06 DIAGNOSIS — E1122 Type 2 diabetes mellitus with diabetic chronic kidney disease: Secondary | ICD-10-CM | POA: Diagnosis not present

## 2018-11-06 DIAGNOSIS — N2581 Secondary hyperparathyroidism of renal origin: Secondary | ICD-10-CM | POA: Diagnosis not present

## 2018-11-06 DIAGNOSIS — N186 End stage renal disease: Secondary | ICD-10-CM | POA: Diagnosis not present

## 2018-11-06 DIAGNOSIS — D509 Iron deficiency anemia, unspecified: Secondary | ICD-10-CM | POA: Diagnosis not present

## 2018-11-08 DIAGNOSIS — D509 Iron deficiency anemia, unspecified: Secondary | ICD-10-CM | POA: Diagnosis not present

## 2018-11-08 DIAGNOSIS — E1122 Type 2 diabetes mellitus with diabetic chronic kidney disease: Secondary | ICD-10-CM | POA: Diagnosis not present

## 2018-11-08 DIAGNOSIS — N2581 Secondary hyperparathyroidism of renal origin: Secondary | ICD-10-CM | POA: Diagnosis not present

## 2018-11-08 DIAGNOSIS — N186 End stage renal disease: Secondary | ICD-10-CM | POA: Diagnosis not present

## 2018-11-08 DIAGNOSIS — D631 Anemia in chronic kidney disease: Secondary | ICD-10-CM | POA: Diagnosis not present

## 2018-11-10 DIAGNOSIS — E1122 Type 2 diabetes mellitus with diabetic chronic kidney disease: Secondary | ICD-10-CM | POA: Diagnosis not present

## 2018-11-10 DIAGNOSIS — D509 Iron deficiency anemia, unspecified: Secondary | ICD-10-CM | POA: Diagnosis not present

## 2018-11-10 DIAGNOSIS — D631 Anemia in chronic kidney disease: Secondary | ICD-10-CM | POA: Diagnosis not present

## 2018-11-10 DIAGNOSIS — N186 End stage renal disease: Secondary | ICD-10-CM | POA: Diagnosis not present

## 2018-11-10 DIAGNOSIS — N2581 Secondary hyperparathyroidism of renal origin: Secondary | ICD-10-CM | POA: Diagnosis not present

## 2018-11-13 DIAGNOSIS — D631 Anemia in chronic kidney disease: Secondary | ICD-10-CM | POA: Diagnosis not present

## 2018-11-13 DIAGNOSIS — D509 Iron deficiency anemia, unspecified: Secondary | ICD-10-CM | POA: Diagnosis not present

## 2018-11-13 DIAGNOSIS — N2581 Secondary hyperparathyroidism of renal origin: Secondary | ICD-10-CM | POA: Diagnosis not present

## 2018-11-13 DIAGNOSIS — N186 End stage renal disease: Secondary | ICD-10-CM | POA: Diagnosis not present

## 2018-11-13 DIAGNOSIS — E1122 Type 2 diabetes mellitus with diabetic chronic kidney disease: Secondary | ICD-10-CM | POA: Diagnosis not present

## 2018-11-15 DIAGNOSIS — N2581 Secondary hyperparathyroidism of renal origin: Secondary | ICD-10-CM | POA: Diagnosis not present

## 2018-11-15 DIAGNOSIS — E1122 Type 2 diabetes mellitus with diabetic chronic kidney disease: Secondary | ICD-10-CM | POA: Diagnosis not present

## 2018-11-15 DIAGNOSIS — D631 Anemia in chronic kidney disease: Secondary | ICD-10-CM | POA: Diagnosis not present

## 2018-11-15 DIAGNOSIS — N186 End stage renal disease: Secondary | ICD-10-CM | POA: Diagnosis not present

## 2018-11-15 DIAGNOSIS — D509 Iron deficiency anemia, unspecified: Secondary | ICD-10-CM | POA: Diagnosis not present

## 2018-11-17 DIAGNOSIS — D509 Iron deficiency anemia, unspecified: Secondary | ICD-10-CM | POA: Diagnosis not present

## 2018-11-17 DIAGNOSIS — N2581 Secondary hyperparathyroidism of renal origin: Secondary | ICD-10-CM | POA: Diagnosis not present

## 2018-11-17 DIAGNOSIS — N186 End stage renal disease: Secondary | ICD-10-CM | POA: Diagnosis not present

## 2018-11-17 DIAGNOSIS — D631 Anemia in chronic kidney disease: Secondary | ICD-10-CM | POA: Diagnosis not present

## 2018-11-17 DIAGNOSIS — E1122 Type 2 diabetes mellitus with diabetic chronic kidney disease: Secondary | ICD-10-CM | POA: Diagnosis not present

## 2018-11-20 DIAGNOSIS — D509 Iron deficiency anemia, unspecified: Secondary | ICD-10-CM | POA: Diagnosis not present

## 2018-11-20 DIAGNOSIS — N2581 Secondary hyperparathyroidism of renal origin: Secondary | ICD-10-CM | POA: Diagnosis not present

## 2018-11-20 DIAGNOSIS — D631 Anemia in chronic kidney disease: Secondary | ICD-10-CM | POA: Diagnosis not present

## 2018-11-20 DIAGNOSIS — E1122 Type 2 diabetes mellitus with diabetic chronic kidney disease: Secondary | ICD-10-CM | POA: Diagnosis not present

## 2018-11-20 DIAGNOSIS — N186 End stage renal disease: Secondary | ICD-10-CM | POA: Diagnosis not present

## 2018-11-22 DIAGNOSIS — E1122 Type 2 diabetes mellitus with diabetic chronic kidney disease: Secondary | ICD-10-CM | POA: Diagnosis not present

## 2018-11-22 DIAGNOSIS — D631 Anemia in chronic kidney disease: Secondary | ICD-10-CM | POA: Diagnosis not present

## 2018-11-22 DIAGNOSIS — N2581 Secondary hyperparathyroidism of renal origin: Secondary | ICD-10-CM | POA: Diagnosis not present

## 2018-11-22 DIAGNOSIS — N186 End stage renal disease: Secondary | ICD-10-CM | POA: Diagnosis not present

## 2018-11-22 DIAGNOSIS — D509 Iron deficiency anemia, unspecified: Secondary | ICD-10-CM | POA: Diagnosis not present

## 2018-11-24 DIAGNOSIS — N186 End stage renal disease: Secondary | ICD-10-CM | POA: Diagnosis not present

## 2018-11-24 DIAGNOSIS — D509 Iron deficiency anemia, unspecified: Secondary | ICD-10-CM | POA: Diagnosis not present

## 2018-11-24 DIAGNOSIS — E1122 Type 2 diabetes mellitus with diabetic chronic kidney disease: Secondary | ICD-10-CM | POA: Diagnosis not present

## 2018-11-24 DIAGNOSIS — D631 Anemia in chronic kidney disease: Secondary | ICD-10-CM | POA: Diagnosis not present

## 2018-11-24 DIAGNOSIS — N2581 Secondary hyperparathyroidism of renal origin: Secondary | ICD-10-CM | POA: Diagnosis not present

## 2018-11-26 DIAGNOSIS — D509 Iron deficiency anemia, unspecified: Secondary | ICD-10-CM | POA: Diagnosis not present

## 2018-11-26 DIAGNOSIS — N2581 Secondary hyperparathyroidism of renal origin: Secondary | ICD-10-CM | POA: Diagnosis not present

## 2018-11-26 DIAGNOSIS — N186 End stage renal disease: Secondary | ICD-10-CM | POA: Diagnosis not present

## 2018-11-26 DIAGNOSIS — D631 Anemia in chronic kidney disease: Secondary | ICD-10-CM | POA: Diagnosis not present

## 2018-11-26 DIAGNOSIS — E1122 Type 2 diabetes mellitus with diabetic chronic kidney disease: Secondary | ICD-10-CM | POA: Diagnosis not present

## 2018-11-28 DIAGNOSIS — E1122 Type 2 diabetes mellitus with diabetic chronic kidney disease: Secondary | ICD-10-CM | POA: Diagnosis not present

## 2018-11-28 DIAGNOSIS — N186 End stage renal disease: Secondary | ICD-10-CM | POA: Diagnosis not present

## 2018-11-28 DIAGNOSIS — D509 Iron deficiency anemia, unspecified: Secondary | ICD-10-CM | POA: Diagnosis not present

## 2018-11-28 DIAGNOSIS — D631 Anemia in chronic kidney disease: Secondary | ICD-10-CM | POA: Diagnosis not present

## 2018-11-28 DIAGNOSIS — N2581 Secondary hyperparathyroidism of renal origin: Secondary | ICD-10-CM | POA: Diagnosis not present

## 2018-12-01 DIAGNOSIS — N2581 Secondary hyperparathyroidism of renal origin: Secondary | ICD-10-CM | POA: Diagnosis not present

## 2018-12-01 DIAGNOSIS — D509 Iron deficiency anemia, unspecified: Secondary | ICD-10-CM | POA: Diagnosis not present

## 2018-12-01 DIAGNOSIS — N186 End stage renal disease: Secondary | ICD-10-CM | POA: Diagnosis not present

## 2018-12-01 DIAGNOSIS — D631 Anemia in chronic kidney disease: Secondary | ICD-10-CM | POA: Diagnosis not present

## 2018-12-01 DIAGNOSIS — E1122 Type 2 diabetes mellitus with diabetic chronic kidney disease: Secondary | ICD-10-CM | POA: Diagnosis not present

## 2018-12-03 DIAGNOSIS — N186 End stage renal disease: Secondary | ICD-10-CM | POA: Diagnosis not present

## 2018-12-03 DIAGNOSIS — N2581 Secondary hyperparathyroidism of renal origin: Secondary | ICD-10-CM | POA: Diagnosis not present

## 2018-12-03 DIAGNOSIS — D631 Anemia in chronic kidney disease: Secondary | ICD-10-CM | POA: Diagnosis not present

## 2018-12-03 DIAGNOSIS — E1122 Type 2 diabetes mellitus with diabetic chronic kidney disease: Secondary | ICD-10-CM | POA: Diagnosis not present

## 2018-12-03 DIAGNOSIS — D509 Iron deficiency anemia, unspecified: Secondary | ICD-10-CM | POA: Diagnosis not present

## 2018-12-05 DIAGNOSIS — D509 Iron deficiency anemia, unspecified: Secondary | ICD-10-CM | POA: Diagnosis not present

## 2018-12-05 DIAGNOSIS — E1122 Type 2 diabetes mellitus with diabetic chronic kidney disease: Secondary | ICD-10-CM | POA: Diagnosis not present

## 2018-12-05 DIAGNOSIS — D631 Anemia in chronic kidney disease: Secondary | ICD-10-CM | POA: Diagnosis not present

## 2018-12-05 DIAGNOSIS — N2581 Secondary hyperparathyroidism of renal origin: Secondary | ICD-10-CM | POA: Diagnosis not present

## 2018-12-05 DIAGNOSIS — N186 End stage renal disease: Secondary | ICD-10-CM | POA: Diagnosis not present

## 2018-12-06 ENCOUNTER — Inpatient Hospital Stay (HOSPITAL_COMMUNITY)
Admission: EM | Admit: 2018-12-06 | Discharge: 2018-12-09 | DRG: 341 | Disposition: A | Discharge status: Home / Self Care | Payer: Medicare Other | Attending: Family Medicine | Admitting: Family Medicine

## 2018-12-06 ENCOUNTER — Emergency Department (HOSPITAL_COMMUNITY): Payer: Medicare Other

## 2018-12-06 ENCOUNTER — Inpatient Hospital Stay (HOSPITAL_COMMUNITY): Payer: Medicare Other | Admitting: Certified Registered"

## 2018-12-06 ENCOUNTER — Encounter (HOSPITAL_COMMUNITY)
Admission: EM | Disposition: A | Discharge status: Home / Self Care | Payer: Self-pay | Source: Home / Self Care | Attending: Family Medicine

## 2018-12-06 ENCOUNTER — Encounter (HOSPITAL_COMMUNITY): Payer: Self-pay | Admitting: *Deleted

## 2018-12-06 DIAGNOSIS — Z89511 Acquired absence of right leg below knee: Secondary | ICD-10-CM

## 2018-12-06 DIAGNOSIS — E1142 Type 2 diabetes mellitus with diabetic polyneuropathy: Secondary | ICD-10-CM | POA: Diagnosis not present

## 2018-12-06 DIAGNOSIS — R1031 Right lower quadrant pain: Secondary | ICD-10-CM

## 2018-12-06 DIAGNOSIS — E1165 Type 2 diabetes mellitus with hyperglycemia: Secondary | ICD-10-CM | POA: Diagnosis present

## 2018-12-06 DIAGNOSIS — K5909 Other constipation: Secondary | ICD-10-CM | POA: Diagnosis present

## 2018-12-06 DIAGNOSIS — R1084 Generalized abdominal pain: Secondary | ICD-10-CM | POA: Diagnosis not present

## 2018-12-06 DIAGNOSIS — Z992 Dependence on renal dialysis: Secondary | ICD-10-CM | POA: Diagnosis not present

## 2018-12-06 DIAGNOSIS — K37 Unspecified appendicitis: Secondary | ICD-10-CM

## 2018-12-06 DIAGNOSIS — Z820 Family history of epilepsy and other diseases of the nervous system: Secondary | ICD-10-CM

## 2018-12-06 DIAGNOSIS — M898X9 Other specified disorders of bone, unspecified site: Secondary | ICD-10-CM | POA: Diagnosis not present

## 2018-12-06 DIAGNOSIS — I739 Peripheral vascular disease, unspecified: Secondary | ICD-10-CM | POA: Diagnosis not present

## 2018-12-06 DIAGNOSIS — D729 Disorder of white blood cells, unspecified: Secondary | ICD-10-CM

## 2018-12-06 DIAGNOSIS — N2581 Secondary hyperparathyroidism of renal origin: Secondary | ICD-10-CM | POA: Diagnosis not present

## 2018-12-06 DIAGNOSIS — D72829 Elevated white blood cell count, unspecified: Secondary | ICD-10-CM | POA: Diagnosis not present

## 2018-12-06 DIAGNOSIS — Z89022 Acquired absence of left finger(s): Secondary | ICD-10-CM | POA: Diagnosis not present

## 2018-12-06 DIAGNOSIS — F1011 Alcohol abuse, in remission: Secondary | ICD-10-CM | POA: Diagnosis present

## 2018-12-06 DIAGNOSIS — Z811 Family history of alcohol abuse and dependence: Secondary | ICD-10-CM | POA: Diagnosis not present

## 2018-12-06 DIAGNOSIS — R52 Pain, unspecified: Secondary | ICD-10-CM | POA: Diagnosis not present

## 2018-12-06 DIAGNOSIS — K429 Umbilical hernia without obstruction or gangrene: Secondary | ICD-10-CM | POA: Diagnosis not present

## 2018-12-06 DIAGNOSIS — Z833 Family history of diabetes mellitus: Secondary | ICD-10-CM

## 2018-12-06 DIAGNOSIS — K921 Melena: Secondary | ICD-10-CM | POA: Diagnosis not present

## 2018-12-06 DIAGNOSIS — K35891 Other acute appendicitis without perforation, with gangrene: Secondary | ICD-10-CM | POA: Diagnosis present

## 2018-12-06 DIAGNOSIS — H5462 Unqualified visual loss, left eye, normal vision right eye: Secondary | ICD-10-CM | POA: Diagnosis present

## 2018-12-06 DIAGNOSIS — D649 Anemia, unspecified: Secondary | ICD-10-CM | POA: Diagnosis present

## 2018-12-06 DIAGNOSIS — Z794 Long term (current) use of insulin: Secondary | ICD-10-CM

## 2018-12-06 DIAGNOSIS — N186 End stage renal disease: Secondary | ICD-10-CM | POA: Diagnosis present

## 2018-12-06 DIAGNOSIS — Z89021 Acquired absence of right finger(s): Secondary | ICD-10-CM

## 2018-12-06 DIAGNOSIS — Z79899 Other long term (current) drug therapy: Secondary | ICD-10-CM | POA: Diagnosis not present

## 2018-12-06 DIAGNOSIS — E785 Hyperlipidemia, unspecified: Secondary | ICD-10-CM | POA: Diagnosis present

## 2018-12-06 DIAGNOSIS — I120 Hypertensive chronic kidney disease with stage 5 chronic kidney disease or end stage renal disease: Secondary | ICD-10-CM | POA: Diagnosis not present

## 2018-12-06 DIAGNOSIS — D631 Anemia in chronic kidney disease: Secondary | ICD-10-CM | POA: Diagnosis not present

## 2018-12-06 DIAGNOSIS — R509 Fever, unspecified: Secondary | ICD-10-CM | POA: Diagnosis not present

## 2018-12-06 DIAGNOSIS — Z8249 Family history of ischemic heart disease and other diseases of the circulatory system: Secondary | ICD-10-CM | POA: Diagnosis not present

## 2018-12-06 DIAGNOSIS — Z841 Family history of disorders of kidney and ureter: Secondary | ICD-10-CM

## 2018-12-06 DIAGNOSIS — I10 Essential (primary) hypertension: Secondary | ICD-10-CM | POA: Diagnosis not present

## 2018-12-06 DIAGNOSIS — E1151 Type 2 diabetes mellitus with diabetic peripheral angiopathy without gangrene: Secondary | ICD-10-CM | POA: Diagnosis present

## 2018-12-06 DIAGNOSIS — K3580 Unspecified acute appendicitis: Secondary | ICD-10-CM

## 2018-12-06 DIAGNOSIS — E1122 Type 2 diabetes mellitus with diabetic chronic kidney disease: Secondary | ICD-10-CM | POA: Diagnosis present

## 2018-12-06 DIAGNOSIS — E1159 Type 2 diabetes mellitus with other circulatory complications: Secondary | ICD-10-CM | POA: Diagnosis not present

## 2018-12-06 DIAGNOSIS — K219 Gastro-esophageal reflux disease without esophagitis: Secondary | ICD-10-CM | POA: Diagnosis present

## 2018-12-06 DIAGNOSIS — K3531 Acute appendicitis with localized peritonitis and gangrene, without perforation: Secondary | ICD-10-CM | POA: Diagnosis not present

## 2018-12-06 HISTORY — DX: Unspecified appendicitis: K37

## 2018-12-06 HISTORY — PX: LAPAROSCOPIC APPENDECTOMY: SHX408

## 2018-12-06 LAB — COMPREHENSIVE METABOLIC PANEL
ALT: 12 U/L (ref 0–44)
AST: 15 U/L (ref 15–41)
Albumin: 3.5 g/dL (ref 3.5–5.0)
Alkaline Phosphatase: 70 U/L (ref 38–126)
Anion gap: 17 — ABNORMAL HIGH (ref 5–15)
BUN: 37 mg/dL — ABNORMAL HIGH (ref 6–20)
CO2: 25 mmol/L (ref 22–32)
Calcium: 9 mg/dL (ref 8.9–10.3)
Chloride: 98 mmol/L (ref 98–111)
Creatinine, Ser: 6.31 mg/dL — ABNORMAL HIGH (ref 0.44–1.00)
GFR calc Af Amer: 8 mL/min — ABNORMAL LOW (ref >60)
GFR calc non Af Amer: 7 mL/min — ABNORMAL LOW (ref >60)
Glucose, Bld: 175 mg/dL — ABNORMAL HIGH (ref 70–99)
Potassium: 4 mmol/L (ref 3.5–5.1)
Sodium: 140 mmol/L (ref 135–145)
Total Bilirubin: 0.4 mg/dL (ref 0.3–1.2)
Total Protein: 7.4 g/dL (ref 6.5–8.1)

## 2018-12-06 LAB — I-STAT BETA HCG BLOOD, ED (MC, WL, AP ONLY): I-stat hCG, quantitative: <5 m[IU]/mL (ref <5)

## 2018-12-06 LAB — CBC WITH DIFFERENTIAL/PLATELET
Abs Immature Granulocytes: 0.16 10*3/uL — ABNORMAL HIGH (ref 0.00–0.07)
Basophils Absolute: 0 10*3/uL (ref 0.0–0.1)
Basophils Relative: 0 %
Eosinophils Absolute: 0.1 10*3/uL (ref 0.0–0.5)
Eosinophils Relative: 0 %
HCT: 42.4 % (ref 36.0–46.0)
Hemoglobin: 12.5 g/dL (ref 12.0–15.0)
Immature Granulocytes: 1 %
Lymphocytes Relative: 5 %
Lymphs Abs: 1.1 10*3/uL (ref 0.7–4.0)
MCH: 22.6 pg — ABNORMAL LOW (ref 26.0–34.0)
MCHC: 29.5 g/dL — ABNORMAL LOW (ref 30.0–36.0)
MCV: 76.7 fL — ABNORMAL LOW (ref 80.0–100.0)
Monocytes Absolute: 1 10*3/uL (ref 0.1–1.0)
Monocytes Relative: 5 %
Neutro Abs: 18.7 10*3/uL — ABNORMAL HIGH (ref 1.7–7.7)
Neutrophils Relative %: 89 %
Platelets: ADEQUATE 10*3/uL (ref 150–400)
RBC: 5.53 MIL/uL — ABNORMAL HIGH (ref 3.87–5.11)
RDW: 19.6 % — ABNORMAL HIGH (ref 11.5–15.5)
WBC: 21.1 10*3/uL — ABNORMAL HIGH (ref 4.0–10.5)
nRBC: 0 % (ref 0.0–0.2)

## 2018-12-06 LAB — URINALYSIS, ROUTINE W REFLEX MICROSCOPIC
Bilirubin Urine: NEGATIVE
Glucose, UA: >=500 mg/dL — AB
Hgb urine dipstick: NEGATIVE
Ketones, ur: NEGATIVE mg/dL
Nitrite: NEGATIVE
Protein, ur: 100 mg/dL — AB
Specific Gravity, Urine: 1.015 (ref 1.005–1.030)
Squamous Epithelial / LPF: >50 — ABNORMAL HIGH (ref 0–5)
pH: 8 (ref 5.0–8.0)

## 2018-12-06 LAB — LIPASE, BLOOD: Lipase: 29 U/L (ref 11–51)

## 2018-12-06 LAB — GLUCOSE, CAPILLARY
Glucose-Capillary: 192 mg/dL — ABNORMAL HIGH (ref 70–99)
Glucose-Capillary: 278 mg/dL — ABNORMAL HIGH (ref 70–99)

## 2018-12-06 SURGERY — APPENDECTOMY, LAPAROSCOPIC
Anesthesia: General | Site: Abdomen

## 2018-12-06 MED ORDER — METOPROLOL TARTRATE 5 MG/5ML IV SOLN
INTRAVENOUS | Status: DC | PRN
  Administered 2018-12-06 (×2): 2.5 mg via INTRAVENOUS

## 2018-12-06 MED ORDER — LIDOCAINE 2% (20 MG/ML) 5 ML SYRINGE
INTRAMUSCULAR | Status: AC
  Filled 2018-12-06: qty 5

## 2018-12-06 MED ORDER — SUCCINYLCHOLINE CHLORIDE 200 MG/10ML IV SOSY
PREFILLED_SYRINGE | INTRAVENOUS | Status: AC
  Filled 2018-12-06: qty 10

## 2018-12-06 MED ORDER — FENTANYL CITRATE (PF) 100 MCG/2ML IJ SOLN
50.0000 ug | Freq: Once | INTRAMUSCULAR | Status: AC
  Administered 2018-12-06: 50 ug via INTRAMUSCULAR
  Filled 2018-12-06: qty 2

## 2018-12-06 MED ORDER — ONDANSETRON HCL 4 MG/2ML IJ SOLN: 4.0000 mg | Freq: Four times a day (QID) | INTRAMUSCULAR | Status: DC | PRN

## 2018-12-06 MED ORDER — HEPARIN SODIUM (PORCINE) 5000 UNIT/ML IJ SOLN
5000.0000 [IU] | Freq: Three times a day (TID) | INTRAMUSCULAR | Status: DC
  Administered 2018-12-07 – 2018-12-09 (×8): 5000 [IU] via SUBCUTANEOUS
  Filled 2018-12-06 (×8): qty 1

## 2018-12-06 MED ORDER — HYDROCODONE-ACETAMINOPHEN 5-325 MG PO TABS
1.0000 | ORAL_TABLET | ORAL | Status: DC | PRN
  Administered 2018-12-07 (×2): 1 via ORAL
  Filled 2018-12-06 (×2): qty 1

## 2018-12-06 MED ORDER — ONDANSETRON HCL 4 MG/2ML IJ SOLN
INTRAMUSCULAR | Status: DC | PRN
  Administered 2018-12-06: 4 mg via INTRAVENOUS

## 2018-12-06 MED ORDER — PIPERACILLIN-TAZOBACTAM 3.375 G IVPB
3.3750 g | Freq: Two times a day (BID) | INTRAVENOUS | Status: DC
  Administered 2018-12-07 – 2018-12-08 (×3): 3.375 g via INTRAVENOUS
  Filled 2018-12-06 (×4): qty 50

## 2018-12-06 MED ORDER — PIPERACILLIN-TAZOBACTAM 3.375 G IVPB 30 MIN
3.3750 g | Freq: Once | INTRAVENOUS | Status: AC
  Administered 2018-12-06: 3.375 g via INTRAVENOUS
  Filled 2018-12-06: qty 50

## 2018-12-06 MED ORDER — LIDOCAINE 2% (20 MG/ML) 5 ML SYRINGE
INTRAMUSCULAR | Status: DC | PRN
  Administered 2018-12-06: 100 mg via INTRAVENOUS

## 2018-12-06 MED ORDER — LOSARTAN POTASSIUM 50 MG PO TABS
50.0000 mg | ORAL_TABLET | Freq: Every day | ORAL | Status: DC
  Administered 2018-12-07 – 2018-12-09 (×3): 50 mg via ORAL
  Filled 2018-12-06 (×4): qty 1

## 2018-12-06 MED ORDER — MIDAZOLAM HCL 2 MG/2ML IJ SOLN
INTRAMUSCULAR | Status: AC
  Filled 2018-12-06: qty 2

## 2018-12-06 MED ORDER — PROPOFOL 10 MG/ML IV BOLUS
INTRAVENOUS | Status: AC
  Filled 2018-12-06: qty 20

## 2018-12-06 MED ORDER — DIPHENHYDRAMINE HCL 50 MG/ML IJ SOLN
INTRAMUSCULAR | Status: DC | PRN
  Administered 2018-12-06: 25 mg via INTRAVENOUS

## 2018-12-06 MED ORDER — SODIUM CHLORIDE 0.9 % IR SOLN
Status: DC | PRN
  Administered 2018-12-06: 1000 mL

## 2018-12-06 MED ORDER — INSULIN ASPART 100 UNIT/ML ~~LOC~~ SOLN
0.0000 [IU] | Freq: Three times a day (TID) | SUBCUTANEOUS | Status: DC
  Administered 2018-12-07: 5 [IU] via SUBCUTANEOUS

## 2018-12-06 MED ORDER — MORPHINE SULFATE (PF) 4 MG/ML IV SOLN
4.0000 mg | Freq: Once | INTRAVENOUS | Status: AC
  Administered 2018-12-06: 4 mg via INTRAVENOUS
  Filled 2018-12-06: qty 1

## 2018-12-06 MED ORDER — POLYETHYLENE GLYCOL 3350 17 G PO PACK: 17.0000 g | PACK | Freq: Every day | ORAL | Status: DC | PRN

## 2018-12-06 MED ORDER — ONDANSETRON HCL 4 MG PO TABS: 4.0000 mg | ORAL_TABLET | Freq: Four times a day (QID) | ORAL | Status: DC | PRN

## 2018-12-06 MED ORDER — PANTOPRAZOLE SODIUM 20 MG PO TBEC
20.0000 mg | DELAYED_RELEASE_TABLET | Freq: Every day | ORAL | Status: DC
  Administered 2018-12-07 – 2018-12-09 (×3): 20 mg via ORAL
  Filled 2018-12-06 (×3): qty 1

## 2018-12-06 MED ORDER — ONDANSETRON HCL 4 MG/2ML IJ SOLN: 4.0000 mg | Freq: Once | INTRAMUSCULAR | Status: DC

## 2018-12-06 MED ORDER — ATORVASTATIN CALCIUM 40 MG PO TABS
40.0000 mg | ORAL_TABLET | Freq: Every day | ORAL | Status: DC
  Administered 2018-12-07 – 2018-12-09 (×3): 40 mg via ORAL
  Filled 2018-12-06 (×3): qty 1

## 2018-12-06 MED ORDER — FENTANYL CITRATE (PF) 100 MCG/2ML IJ SOLN: 50.0000 ug | Freq: Once | INTRAMUSCULAR | Status: DC

## 2018-12-06 MED ORDER — BUPIVACAINE-EPINEPHRINE 0.25% -1:200000 IJ SOLN
INTRAMUSCULAR | Status: DC | PRN
  Administered 2018-12-06: 30 mL

## 2018-12-06 MED ORDER — ONDANSETRON HCL 4 MG/2ML IJ SOLN
4.0000 mg | Freq: Once | INTRAMUSCULAR | Status: AC
  Administered 2018-12-06: 4 mg via INTRAVENOUS
  Filled 2018-12-06: qty 2

## 2018-12-06 MED ORDER — ONDANSETRON 4 MG PO TBDP: 4.0000 mg | ORAL_TABLET | Freq: Once | ORAL | Status: DC

## 2018-12-06 MED ORDER — 0.9 % SODIUM CHLORIDE (POUR BTL) OPTIME
TOPICAL | Status: DC | PRN
  Administered 2018-12-06: 1000 mL

## 2018-12-06 MED ORDER — METOPROLOL TARTRATE 25 MG PO TABS
25.0000 mg | ORAL_TABLET | Freq: Two times a day (BID) | ORAL | Status: DC
  Administered 2018-12-07 – 2018-12-09 (×6): 25 mg via ORAL
  Filled 2018-12-06 (×6): qty 1

## 2018-12-06 MED ORDER — ACETAMINOPHEN 325 MG PO TABS: 650.0000 mg | ORAL_TABLET | Freq: Four times a day (QID) | ORAL | Status: DC | PRN

## 2018-12-06 MED ORDER — FENTANYL CITRATE (PF) 250 MCG/5ML IJ SOLN
INTRAMUSCULAR | Status: AC
  Filled 2018-12-06: qty 5

## 2018-12-06 MED ORDER — SUGAMMADEX SODIUM 500 MG/5ML IV SOLN
INTRAVENOUS | Status: DC | PRN
  Administered 2018-12-06: 400 mg via INTRAVENOUS

## 2018-12-06 MED ORDER — ACETAMINOPHEN 650 MG RE SUPP: 650.0000 mg | Freq: Four times a day (QID) | RECTAL | Status: DC | PRN

## 2018-12-06 MED ORDER — AMLODIPINE BESYLATE 10 MG PO TABS
10.0000 mg | ORAL_TABLET | Freq: Every day | ORAL | Status: DC
  Administered 2018-12-07 – 2018-12-09 (×3): 10 mg via ORAL
  Filled 2018-12-06 (×3): qty 1

## 2018-12-06 MED ORDER — ROCURONIUM BROMIDE 50 MG/5ML IV SOSY
PREFILLED_SYRINGE | INTRAVENOUS | Status: DC | PRN
  Administered 2018-12-06: 50 mg via INTRAVENOUS

## 2018-12-06 MED ORDER — MIDAZOLAM HCL 5 MG/5ML IJ SOLN
INTRAMUSCULAR | Status: DC | PRN
  Administered 2018-12-06: 1 mg via INTRAVENOUS

## 2018-12-06 MED ORDER — ROCURONIUM BROMIDE 50 MG/5ML IV SOSY
PREFILLED_SYRINGE | INTRAVENOUS | Status: AC
  Filled 2018-12-06: qty 5

## 2018-12-06 MED ORDER — LACTATED RINGERS IV SOLN
INTRAVENOUS | Status: DC | PRN
  Administered 2018-12-06: 21:00:00 via INTRAVENOUS

## 2018-12-06 MED ORDER — BUPIVACAINE-EPINEPHRINE (PF) 0.25% -1:200000 IJ SOLN
INTRAMUSCULAR | Status: AC
  Filled 2018-12-06: qty 30

## 2018-12-06 MED ORDER — NORTRIPTYLINE HCL 25 MG PO CAPS
25.0000 mg | ORAL_CAPSULE | Freq: Every day | ORAL | Status: DC
  Administered 2018-12-07 – 2018-12-08 (×2): 25 mg via ORAL
  Filled 2018-12-06 (×3): qty 1

## 2018-12-06 MED ORDER — SUCCINYLCHOLINE CHLORIDE 20 MG/ML IJ SOLN
INTRAMUSCULAR | Status: DC | PRN
  Administered 2018-12-06: 100 mg via INTRAVENOUS

## 2018-12-06 MED ORDER — DEXAMETHASONE SODIUM PHOSPHATE 10 MG/ML IJ SOLN
INTRAMUSCULAR | Status: DC | PRN
  Administered 2018-12-06: 10 mg via INTRAVENOUS

## 2018-12-06 MED ORDER — FENTANYL CITRATE (PF) 100 MCG/2ML IJ SOLN
INTRAMUSCULAR | Status: DC | PRN
  Administered 2018-12-06: 50 ug via INTRAVENOUS
  Administered 2018-12-06: 100 ug via INTRAVENOUS
  Administered 2018-12-06 (×2): 50 ug via INTRAVENOUS

## 2018-12-06 MED ORDER — PROPOFOL 10 MG/ML IV BOLUS
INTRAVENOUS | Status: DC | PRN
  Administered 2018-12-06: 120 mg via INTRAVENOUS

## 2018-12-06 SURGICAL SUPPLY — 44 items
ADH SKN CLS APL DERMABOND .7 (GAUZE/BANDAGES/DRESSINGS) ×1
APPLIER CLIP 5 13 M/L LIGAMAX5 (MISCELLANEOUS)
APR CLP MED LRG 5 ANG JAW (MISCELLANEOUS)
BAG SPEC RTRVL 10 TROC 200 (ENDOMECHANICALS) ×1
CANISTER SUCT 3000ML PPV (MISCELLANEOUS) ×2 IMPLANT
CHLORAPREP W/TINT 26ML (MISCELLANEOUS) ×2 IMPLANT
CLIP APPLIE 5 13 M/L LIGAMAX5 (MISCELLANEOUS) IMPLANT
CLIP VESOLOCK XL 6/CT (CLIP) ×2 IMPLANT
COVER SURGICAL LIGHT HANDLE (MISCELLANEOUS) ×2 IMPLANT
COVER WAND RF STERILE (DRAPES) ×2 IMPLANT
DERMABOND ADVANCED (GAUZE/BANDAGES/DRESSINGS) ×1
DERMABOND ADVANCED .7 DNX12 (GAUZE/BANDAGES/DRESSINGS) ×1 IMPLANT
DRAIN CHANNEL 19F RND (DRAIN) ×1 IMPLANT
ELECT REM PT RETURN 9FT ADLT (ELECTROSURGICAL) ×2
ELECTRODE REM PT RTRN 9FT ADLT (ELECTROSURGICAL) ×1 IMPLANT
ENDOLOOP SUT PDS II  0 18 (SUTURE) ×2
ENDOLOOP SUT PDS II 0 18 (SUTURE) IMPLANT
EVACUATOR SILICONE 100CC (DRAIN) ×1 IMPLANT
GLOVE BIOGEL PI IND STRL 7.0 (GLOVE) ×1 IMPLANT
GLOVE BIOGEL PI INDICATOR 7.0 (GLOVE) ×1
GLOVE SURG SS PI 7.0 STRL IVOR (GLOVE) ×2 IMPLANT
GOWN STRL REUS W/ TWL LRG LVL3 (GOWN DISPOSABLE) ×3 IMPLANT
GOWN STRL REUS W/TWL LRG LVL3 (GOWN DISPOSABLE) ×6
KIT BASIN OR (CUSTOM PROCEDURE TRAY) ×2 IMPLANT
KIT TURNOVER KIT B (KITS) ×2 IMPLANT
NEEDLE 22X1 1/2 (OR ONLY) (NEEDLE) ×2 IMPLANT
NS IRRIG 1000ML POUR BTL (IV SOLUTION) ×2 IMPLANT
PAD ARMBOARD 7.5X6 YLW CONV (MISCELLANEOUS) ×4 IMPLANT
POUCH RETRIEVAL ECOSAC 10 (ENDOMECHANICALS) ×1 IMPLANT
POUCH RETRIEVAL ECOSAC 10MM (ENDOMECHANICALS) ×1
SCISSORS LAP 5X35 DISP (ENDOMECHANICALS) ×2 IMPLANT
SET IRRIG TUBING LAPAROSCOPIC (IRRIGATION / IRRIGATOR) ×2 IMPLANT
SET TUBE SMOKE EVAC HIGH FLOW (TUBING) ×2 IMPLANT
SLEEVE ENDOPATH XCEL 5M (ENDOMECHANICALS) ×2 IMPLANT
SPECIMEN JAR SMALL (MISCELLANEOUS) ×2 IMPLANT
SUT ETHILON 2 0 FS 18 (SUTURE) ×1 IMPLANT
SUT MNCRL AB 4-0 PS2 18 (SUTURE) ×2 IMPLANT
TOWEL OR 17X24 6PK STRL BLUE (TOWEL DISPOSABLE) ×2 IMPLANT
TOWEL OR 17X26 10 PK STRL BLUE (TOWEL DISPOSABLE) ×2 IMPLANT
TRAY FOLEY CATH 14FR (SET/KITS/TRAYS/PACK) IMPLANT
TRAY LAPAROSCOPIC MC (CUSTOM PROCEDURE TRAY) ×2 IMPLANT
TROCAR XCEL NON-BLD 11X100MML (ENDOMECHANICALS) ×2 IMPLANT
TROCAR XCEL NON-BLD 5MMX100MML (ENDOMECHANICALS) ×2 IMPLANT
WATER STERILE IRR 1000ML POUR (IV SOLUTION) ×2 IMPLANT

## 2018-12-06 NOTE — ED Notes (Signed)
Lab reports cmp and lipase hemolyzed. Will attempt to redraw.

## 2018-12-06 NOTE — Progress Notes (Signed)
Pharmacy Antibiotic Note  [name] is a 53 y.o. female admitted on 12/06/2018 with acute apendicitis.  Pharmacy has been consulted for Zosyn dosing.  Pt has a hx of ESRD on dialysis.  Will adjust abx doses accordingly.  Plan: Zosyn 3.375g IV q12h (4 hour infusion).     Temp (24hrs), Avg:99.1 F (37.3 C), Min:99.1 F (37.3 C), Max:99.1 F (37.3 C)  Recent Labs  Lab 12/06/18 1545 12/06/18 1808  WBC 21.1*  --   CREATININE  --  6.31*    CrCl cannot be calculated (Unknown ideal weight.).    No Known Allergies  Antimicrobials this admission: Zosyn 1/1 >>   Dose adjustments this admission:   Microbiology results:    Thank you for allowing pharmacy to be a part of this patient's care.  [employer], Pharm.D., BCPS Clinical Pharmacist  **Pharmacist phone directory can now be found on [email or web address] ([email or web address]).  Listed under [place].  12/06/2018 8:00 PM

## 2018-12-06 NOTE — H&P (Addendum)
[facility] Admission History and Physical Service Pager: [phone]  Patient name: [name] Medical record number: [id number] Date of birth: 1966/07/20 Age: 53 y.o. Gender: female  Primary Care Provider: Rory Percy, DO Consultants: gen surg, nephrology Code Status: full- confirmed on admission  Chief Complaint: RLQ pain.   Assessment and Plan: [name] is a 53 y.o. female presenting with acute appendicitis . PMH is significant for ESRD, DM-II, HTN, HLD, BKA, blindness, alcohol dependency.   Appendicitis - patient has 2 days of worsening RLQ pain and one episode of vomiting.  She is afebrile, VSS, and white count is 21.1.  CT abdomen/pelvis showed acute appendicitis. WBC elevated to 21.1. In the ED the patient received zofran and fentanyl for nausea and pain and she report improvement in symptoms. Patient on zosyn currently.  Surgery is planned for tonight.   - admit to med-surg, attending Dr. Owens Shark - vitals per floor - surgery consulted, taken to OR for surgery tonight, appreciate recs - zosyn for nausea - pain control: tylenol. Reassess after surgery.  - morning CBC   ESRD on HD - patient gets HD MWF. Last had HD 12/31 for holiday schedule. Creatinine is 6.31 on admission (baseline is 6-7). BUN 37, sodium potasssium, calcium WNL.  Takes dialyvite 800, auryxia tablet. No urgent need for HD. - nephro consulted for HD needs - continue dialyvite 800 - continue auryxia - morning BMP  DM-II - patient on 10 U  Humulin 70/30 BID. A1c on 11/21 is 7.7.   Glucose 175 on admission.  - pending diet recs after surgery, if NPO will adjust CBG checks and sliding scale - sensitive SSI - CBG w/ meal and qhs.  - holding gabapentin for neuropathy while patient is in surgery.   Alcohol dependency - patient states she has not had a drink of alcohol in over 6 years.  - ciwa scores  - monitor for signs of alcohol withdrawal.   HTN  - patient on  amlodipine 56m, losartan 530mqdaily, and lopressor 5066mID at home.  BP 173/72 in ED, patient did not take home medications day of admission. Asymptomatic. BP elevation also likely worsened by pain.  - holding medications while patient is in surgery, monitor BP after surgery and restart BP meds as able  HLD - patient on lipitor 106m55m home.  Lipid profile in June 2019: 163 total cholesterol, 79 LDL, triglycerides 188.   - continue lipitor once patient is taking PO  blindness - patient can only see shapes out of right eye. - out of bed with assistance only.      Constipation - patient complains of not having bowel movements regularly, takes stool softeners at home - miralax prn for constipation   GERD - patient takes protonix 20mg37mhome.  - continue protonix    FEN/GI: NPO/carb control after surgery, protonix.  Prophylaxis: heparin  Disposition: med-surg  History of Present Illness:  [name] 52 y.15 female w/ PMH of T2DM, blindness, ESRD on HD, prior right BKA presenting with RLQ pain for past 24 hours  Patient stated she started having right lower abdominal pain yesterday during dialysis.  The pain became worse throughout the day and into the night.  This morning she vomited after drinking some water.  She could not tell the color of her vomit because she is blind.  The right lower quadrant abdominal pain worsened to the point that she decided to come to the ED  today.    She has no other complaints.  She denies headache, chest pain, diarrhea, dysuria.    Review Of Systems: Per HPI with the following additions:   Review of Systems  Constitutional: Negative for chills and fever.  HENT: Negative for congestion.   Respiratory: Positive for cough (chronic). Negative for sputum production and shortness of breath.   Cardiovascular: Negative for chest pain and leg swelling.  Gastrointestinal: Positive for nausea. Negative for constipation and diarrhea.  Genitourinary:  Negative for dysuria.  Musculoskeletal: Negative for falls.  Skin: Negative for rash.  Neurological: Negative for seizures, weakness and headaches.    Patient Active Problem List   Diagnosis Date Noted  . Chronic cough 10/26/2017  . Healthcare maintenance 05/24/2017  . Diabetic peripheral neuropathy ([place]) 03/17/2017  . Hyperlipidemia 03/17/2017  . S/P BKA (below knee amputation) unilateral, right ([place]) 01/04/2017  . ESRD on dialysis ([place]) 01/04/2017  . History of alcohol abuse 01/04/2017  . Legally blind 01/04/2017  . Type 2 diabetes mellitus ([place]) 09/01/2011  . Hypertension 09/01/2011  . Anemia     Past Medical History: Past Medical History:  Diagnosis Date  . Alcohol dependency ([place])   . Anemia   . Chronic kidney disease   . Diabetes mellitus    type 2  . Diabetes mellitus type 2, uncontrolled ([place])   . Hypertension   . Legally blind in left eye, as defined in [place]   . Necrotizing fasciitis ([place])   . PONV (postoperative nausea and vomiting)     Past Surgical History: Past Surgical History:  Procedure Laterality Date  . AMPUTATION     right first and left middle finger amputation 2/2 OM 08/2011 by Dr. Amedeo Plenty  . AMPUTATION  01/18/2012   Procedure: AMPUTATION DIGIT;  Surgeon: Tennis Must, MD;  Location: [facility];  Service: Orthopedics;  Laterality: Left;  revision amputation left thumb  . AMPUTATION  08/25/2012   Procedure: AMPUTATION BELOW KNEE;  Surgeon: Newt Minion, MD;  Location: [facility];  Service: Orthopedics;  Laterality: Right;  Right Below Knee Amputation  . BASCILIC VEIN TRANSPOSITION Left 07/22/2015   Procedure: BASILIC VEIN TRANSPOSITION ;  Surgeon: Elam Dutch, MD;  Location: [facility];  Service: Vascular;  Laterality: Left;  . CATARACT EXTRACTION     right   . CESAREAN SECTION  1986  . CESAREAN SECTION  1985  . CESAREAN SECTION  1991  . I&D EXTREMITY  12/24/2011   Procedure: IRRIGATION AND DEBRIDEMENT EXTREMITY;  Surgeon: Tennis Must,  MD;  Location: [facility];  Service: Orthopedics;  Laterality: Left;  I&Dleft thumb with partial amputation  . I&D EXTREMITY  12/26/2011   Procedure: IRRIGATION AND DEBRIDEMENT EXTREMITY;  Surgeon: Tennis Must, MD;  Location: [facility];  Service: Orthopedics;  Laterality: Left;  . I&D EXTREMITY  12/23/2011   Procedure: IRRIGATION AND DEBRIDEMENT EXTREMITY;  Surgeon: Tennis Must, MD;  Location: [facility];  Service: Orthopedics;  Laterality: Left;  I&D Left thumb, hand and arm  . I&D left thumb  12/23/2011  . TUBAL LIGATION      Social History: Social History   Tobacco Use  . Smoking status: Never Smoker  . Smokeless tobacco: Never Used  Substance Use Topics  . Alcohol use: Yes    Alcohol/week: 1.0 standard drinks    Types: 1 Cans of beer per week    Comment: h/o alcohol abuse 4 years ago (07/21/15)  . Drug use: Yes    Types: Cocaine  Comment:  last used cocaine 4 years ago (07/21/15)   Additional social history: lives with boyfriend, denies alcohol or drug use, no tobacco use  Please also refer to relevant sections of EMR.  Family History: Family History  Problem Relation Age of Onset  . Diabetes Mother   . Alcohol abuse Mother   . Diabetes Father   . Alzheimer's disease Father   . Hypertension Father   . Kidney failure Sister   . Diabetes Sister   . High blood pressure Sister    Allergies and Medications: No Known Allergies No current facility-administered medications on file prior to encounter.    Current Outpatient Medications on File Prior to Encounter  Medication Sig Dispense Refill  . amLODipine (NORVASC) 10 MG tablet TAKE 1 TABLET BY MOUTH ONCE DAILY 90 tablet 2  . atorvastatin (LIPITOR) 40 MG tablet TAKE 1 TABLET BY MOUTH ONCE DAILY 90 tablet 2  . AURYXIA 1 GM 210 MG(Fe) tablet TAKE 2 TABLETS BY MOUTH THREE TIMES A DAY WITH MEALS AND 1 TABLET TWICE A DAY WITH SNACKS.   SWALLOW WHOLE, DO NOT CHEW OR CRUSH MEDICATION  6  . B Complex-C-Folic Acid (DIALYVITE 166) 0.8 MG TABS  Take 1 tablet by mouth daily.  11  . Blood Glucose Monitoring Suppl (PRODIGY AUTOCODE BLOOD GLUCOSE) w/Device KIT 1 Device by Does not apply route 3 (three) times daily. 1 each 0  . gabapentin (NEURONTIN) 100 MG capsule TAKE 1 CAPSULE BY MOUTH ONCE DAILY AS NEEDED 90 capsule 0  . glucose blood test strip Use as instructed 100 each 12  . HUMULIN 70/30 KWIKPEN (70-30) 100 UNIT/ML PEN INJECT 10 UNITS SUBCUTANEOUSLY AT BREAKFAST AND AT SUPPER 1 pen 2  . Insulin Pen Needle (EASY TOUCH PEN NEEDLES) 32G X 4 MM MISC 1 each by Does not apply route 3 (three) times daily. 100 each 11  . Lancet Devices (ONE TOUCH DELICA LANCING DEV) MISC Check blood sugars once daily 1 each 0  . losartan (COZAAR) 50 MG tablet Take 1 tablet (50 mg total) by mouth daily. 90 tablet 3  . metoprolol tartrate (LOPRESSOR) 50 MG tablet TAKE 1 TABLET BY MOUTH TWICE DAILY 270 tablet 3  . mupirocin cream (BACTROBAN) 2 % Apply 1 application topically 2 (two) times daily. 15 g 0  . nortriptyline (PAMELOR) 25 MG capsule TAKE 1 CAPSULE BY MOUTH AT BEDTIME 90 capsule 0  . pantoprazole (PROTONIX) 20 MG tablet Take 1 tablet (20 mg total) by mouth daily. 30 tablet 0  . PRODIGY LANCETS 28G MISC 1 each by Does not apply route 3 (three) times daily. 100 each 3    Objective: BP (!) 173/72   Pulse (!) 107   Temp 99.1 F (37.3 C)   Resp 16   LMP 08/06/2018   SpO2 93%  Exam: General: alert, oriented.  Mild discomfort.   Eyes: patient is blind.  Can only see light in her right eye.  Completely blind in left eye. Left cornea is cloudy.   ENTM: moist oral mucosa.  No oropharyngeal erythema.   Cardiovascular: systolic murmur in the left sternal border.  Regular rhythm. Normal rate.   Respiratory: lungs clear to auscultation bilaterally.   Gastrointestinal: soft, tender to light palpation on right side.  Normal bowel sounds.  MSK: patient has right BKA, amputation of right 2nd digit.   Derm: no rashes.  Warm, dry Neuro: CN grossly intact.   Sensation intact bilaterally.  Psych: pleasant affect.  Answers questions spontaneously.  Labs and Imaging: CBC BMET  Recent Labs  Lab 12/06/18 1545  WBC 21.1*  HGB 12.5  HCT 42.4  PLT PLATELET CLUMPS NOTED ON SMEAR, COUNT APPEARS ADEQUATE   Recent Labs  Lab 12/06/18 1808  NA 140  K 4.0  CL 98  CO2 25  BUN 37*  CREATININE 6.31*  GLUCOSE 175*  CALCIUM 9.0     Ct Abdomen Pelvis Wo Contrast  Result Date: 12/06/2018 CLINICAL DATA:  Acute onset of RIGHT LOWER QUADRANT abdominal pain that began yesterday during hemodialysis. Solitary episode of vomiting. EXAM: CT ABDOMEN AND PELVIS WITHOUT CONTRAST TECHNIQUE: Multidetector CT imaging of the abdomen and pelvis was performed following the standard protocol without IV contrast. COMPARISON:  None. FINDINGS: Lower chest: Atelectasis involving the lower lobes. Visualized lung bases otherwise clear. Heart moderately enlarged. Hepatobiliary: Normal unenhanced appearance of the liver. Prominent Riedel's lobe. Gallbladder normal in appearance without calcified gallstones. No biliary ductal dilation. Pancreas: Normal unenhanced appearance. Spleen: Normal unenhanced appearance. Adrenals/Urinary Tract: Normal appearing adrenal glands. No evidence of urinary tract calculi. Within the limits of the unenhanced technique, no focal parenchymal abnormality involving either kidney. No evidence of hydronephrosis involving either kidney. Normal appearing urinary bladder. Stomach/Bowel: Very small hiatal hernia. Stomach decompressed and otherwise unremarkable. Normal-appearing small bowel. Mobile cecum positioned in the RIGHT UPPER QUADRANT of the abdomen. Moderate stool burden throughout the normal appearing colon. Appendix: Location: RIGHT UPPER pelvis, LATERAL retrocecal location. Diameter: 16 mm Appendicolith: Multiple. Mucosal hyper-enhancement: Moderate. Extraluminal gas: Absent. Periappendiceal collection: Absent. Marked edema/inflammation surrounding the  appendix. Vascular/Lymphatic: Mild to moderate iliofemoral atherosclerosis. No evidence of aortic aneurysm. No pathologic lymphadenopathy. Reproductive: Normal-appearing uterus and ovaries without evidence of adnexal mass. Other: None. Musculoskeletal: BILATERAL L4 pars defects without evidence of spondylolisthesis. Mild degenerative disc disease at L4-5 with circumferential disc bulge. Mild multifactorial spinal stenosis at the L4-5 level. No acute findings. IMPRESSION: 1. Acute appendicitis. No evidence of perforation or abscess. 2. Atelectasis involving the lower lobes. 3. Very small hiatal hernia. 4. BILATERAL L4 pars defects without evidence of spondylolisthesis. Mild multifactorial spinal stenosis at L4-5. I telephoned these critical/emergent results to [facility], [place], in the emergency department at the time of interpretation on 12/06/2018 at 7:12 p.m. Electronically Signed   By: Evangeline Dakin M.D.   On: 12/06/2018 19:13     Benay Pike, MD 12/06/2018, 7:34 PM PGY-1, [facility] pager: [phone], text pages welcome    FPTS Upper-Level Resident Addendum   I have independently interviewed and examined the patient. I have discussed the above with the original author and agree with their documentation. My edits for correction/addition/clarification are in pink. Please see also any attending notes.    Lucila Maine, DO PGY-3, [facility] Service pager: [phone] (text pages welcome through [place])

## 2018-12-06 NOTE — Anesthesia Preprocedure Evaluation (Signed)
Anesthesia Evaluation  Patient identified by MRN, date of birth, ID band Patient awake    Reviewed: Allergy & Precautions, NPO status , Patient's Chart, lab work & pertinent test results  History of Anesthesia Complications (+) PONV and history of anesthetic complications  Airway Mallampati: I  TM Distance: >3 FB Neck ROM: Full    Dental  (+) Dental Advisory Given, Edentulous Upper, Edentulous Lower   Pulmonary neg pulmonary ROS,    Pulmonary exam normal        Cardiovascular hypertension, Pt. on home beta blockers Normal cardiovascular exam     Neuro/Psych  Neuromuscular disease negative psych ROS   GI/Hepatic Neg liver ROS,   Endo/Other  negative endocrine ROSdiabetes  Renal/GU ESRF and DialysisRenal disease  negative genitourinary   Musculoskeletal negative musculoskeletal ROS (+)   Abdominal   Peds negative pediatric ROS (+)  Hematology negative hematology ROS (+)   Anesthesia Other Findings   Reproductive/Obstetrics negative OB ROS                             Anesthesia Physical Anesthesia Plan  ASA: III and emergent  Anesthesia Plan: General   Post-op Pain Management:    Induction: Intravenous, Cricoid pressure planned and Rapid sequence  PONV Risk Score and Plan: 4 or greater and Ondansetron, Dexamethasone and Diphenhydramine  Airway Management Planned: Oral ETT  Additional Equipment:   Intra-op Plan:   Post-operative Plan: Extubation in OR  Informed Consent: I have reviewed the patients History and Physical, chart, labs and discussed the procedure including the risks, benefits and alternatives for the proposed anesthesia with the patient or authorized representative who has indicated his/her understanding and acceptance.   Dental advisory given  Plan Discussed with: CRNA and Anesthesiologist  Anesthesia Plan Comments:         Anesthesia Quick  Evaluation

## 2018-12-06 NOTE — ED Notes (Signed)
Admitting at the bedside.  

## 2018-12-06 NOTE — ED Triage Notes (Signed)
Pt is here by EMS by RLQ pain.  Pain began yesterday during dialysis.  Pt reports intermittent aching abdominal pain with chills.  No changes in urine or bowel habits.  Pt did have some nausea and vomited x1.  Pt was alert and oriented and in no distress for ems.   164/71, 96, 16, 99% RA, CBG 221, 12-lead was WNL

## 2018-12-06 NOTE — Transfer of Care (Signed)
Immediate Anesthesia Transfer of Care Note  Patient: [name]  Procedure(s) Performed: APPENDECTOMY LAPAROSCOPIC (N/A Abdomen)  Patient Location: PACU  Anesthesia Type:General  Level of Consciousness: awake and patient cooperative  Airway & Oxygen Therapy: Patient Spontanous Breathing  Post-op Assessment: Report given to RN and Post -op Vital signs reviewed and stable  Post vital signs: Reviewed and stable  Last Vitals:  Vitals Value Taken Time  BP 160/86 12/06/2018 10:34 PM  Temp    Pulse 100 12/06/2018 10:36 PM  Resp 23 12/06/2018 10:36 PM  SpO2 92 % 12/06/2018 10:36 PM  Vitals shown include unvalidated device data.  Last Pain:  Vitals:   12/06/18 2005  TempSrc: Oral  PainSc:          Complications: No apparent anesthesia complications

## 2018-12-06 NOTE — ED Notes (Signed)
Unable to obtain labs.  Pt is a dialysis pt.  Pt states that she usually needs an ultrasound Iv.

## 2018-12-06 NOTE — Progress Notes (Deleted)
  Subjective:   Patient ID: [name]    DOB: [birth year], 53 y.o. female   MRN: [id number]  [name] is a 53 y.o. female with a history of HTN, T2DM, ESRD on HD, s/p R BKA, HLD, h/o alcohol use d/o, chronic cough here for   Diabetes, Type 2 - Last A1c 7.7 10/2018 - Medications: humulin 70/30 3u BID - Compliance: *** - Checking BG at home: *** - Diet: *** - Exercise: wheelchair bound - Eye exam: UTD - Foot exam: UTD - Microalbumin: N/A - Denies symptoms of hypoglycemia, polyuria, polydipsia, numbness extremities, foot ulcers/trauma  Review of Systems:  Per HPI.  [place]eman, medications and smoking status reviewed.  Objective:   There were no vitals taken for this visit. Vitals and nursing note reviewed.  General: well nourished, well developed, in no acute distress with non-toxic appearance HEENT: normocephalic, atraumatic, moist mucous membranes Neck: supple, non-tender without lymphadenopathy CV: regular rate and rhythm without murmurs, rubs, or gallops, no lower extremity edema Lungs: clear to auscultation bilaterally with normal work of breathing Abdomen: soft, non-tender, non-distended, no masses or organomegaly palpable, normoactive bowel sounds Skin: warm, dry, no rashes or lesions Extremities: warm and well perfused, normal tone MSK: ROM grossly intact, strength intact, gait normal Neuro: Alert and oriented, speech normal  Assessment & Plan:   No problem-specific Assessment & Plan notes found for this encounter.  No orders of the defined types were placed in this encounter.  No orders of the defined types were placed in this encounter.   Rory Percy, DO PGY-2, [facility] 12/06/2018 4:27 PM

## 2018-12-06 NOTE — ED Notes (Signed)
Pt assisted to the restroom at this time. ?

## 2018-12-06 NOTE — Anesthesia Procedure Notes (Signed)
Procedure Name: Intubation Date/Time: 12/06/2018 9:28 PM Performed by: Lance Coon, CRNA Pre-anesthesia Checklist: Patient identified, Emergency Drugs available, Suction available, Patient being monitored and Timeout performed Patient Re-evaluated:Patient Re-evaluated prior to induction Oxygen Delivery Method: Circle system utilized Preoxygenation: Pre-oxygenation with 100% oxygen Induction Type: IV induction, Rapid sequence and Cricoid Pressure applied Laryngoscope Size: Miller and 3 Grade View: Grade I Tube type: Oral Tube size: 7.0 mm Number of attempts: 1 Airway Equipment and Method: Stylet Placement Confirmation: ETT inserted through vocal cords under direct vision,  positive ETCO2 and breath sounds checked- equal and bilateral Secured at: 21 cm Tube secured with: Tape Dental Injury: Teeth and Oropharynx as per pre-operative assessment

## 2018-12-06 NOTE — Op Note (Signed)
Preoperative diagnosis: acute gangrenous appendicitis  Postoperative diagnosis: Same   Procedure: laparoscopic appendectomy  Surgeon: Gurney Maxin, M.D.  Asst: none  Anesthesia: Gen.   Indications for procedure: [name] is a 53 y.o. female with symptoms of pain in right lower quadrant, nausea and vomiting consistent with acute appendicitis. Confirmed by CT scan and laboratory values.  Description of procedure: The patient was brought into the operative suite, placed supine. Anesthesia was administered with endotracheal tube. The patient's left arm was tucked. All pressure points were offloaded by foam padding. The patient was prepped and draped in the usual sterile fashion.  A transverse incision was made in the left subcostal area and a 43mm trocar was used to enter the peritoneal cavity with optical entry technique. Pneumoperitoneum was applied with high flow low pressure. 1 62mm trocar was placed one in the LLQ and blunt and sharp dissection was used to remove adhesions of the mid abdomen. This lasted 5 minutes. Next, a 9mm trocar was inserted into the periumbilical space. A transversus abdominal block was placed on the left and right sides. Next, the patient was placed in trendelenberg, rotated to the left. The omentum was retracted cephalad. The cecum and appendix were identified. The appendix was gangrenous and adhered the terminal ileum and cecum. The cecum was in the right upper quadrant and the appendix was near mid abdomen. Blunt dissection was used to free the appendix. The base of the appendix was dissected and a window through the mesoappendix was created with blunt dissection. Cautery was used to dissect the mesoappendix away and 2 0 PDS endoloops were placed at the base of the appendix. The appendix was then cut with scissors. moderate amount of feculent material poured from the appendix on this maneuver. Suction was used to remove all visible evidence of this  contamination.  The appendix was placed in a specimen bag. The pelvis and RLQ were irrigated.  The appendix was removed via the umbilicus. 0 vicryl was used to close the fascial defect. Pneumoperitoneum was removed, all trocars were removed. All incisions were closed with 4-0 monocryl subcuticular stitch. The patient woke from anesthesia and was brought to PACU in stable condition.  Findings: gangrenous appendicitis, umbilical hernia  Specimen: appendix  Blood loss: 30 ml  Local anesthesia: 30 ml marcaine  Complications: spillage of feculent contents  Images:     Gurney Maxin, M.D. General, Bariatric, & Minimally Invasive Surgery [facility]

## 2018-12-06 NOTE — Consult Note (Signed)
Reason for Consult: abdominal pain Referring Physician: Blakley Michna is an 53 y.o. female.  HPI: 53 yo female with 2 days of abdominal pain. Pain has been in the right side. It is constant. It is worse with movement. She denies fevers or chills. Pain is getting worse. It started after dialysis yesterday.  Past Medical History:  Diagnosis Date  . Alcohol dependency ([place])   . Anemia   . Chronic kidney disease   . Diabetes mellitus    type 2  . Diabetes mellitus type 2, uncontrolled ([place])   . Hypertension   . Legally blind in left eye, as defined in [place]   . Necrotizing fasciitis ([place])   . PONV (postoperative nausea and vomiting)     Past Surgical History:  Procedure Laterality Date  . AMPUTATION     right first and left middle finger amputation 2/2 OM 08/2011 by Dr. Amedeo Plenty  . AMPUTATION  01/18/2012   Procedure: AMPUTATION DIGIT;  Surgeon: Tennis Must, MD;  Location: [facility];  Service: Orthopedics;  Laterality: Left;  revision amputation left thumb  . AMPUTATION  08/25/2012   Procedure: AMPUTATION BELOW KNEE;  Surgeon: Newt Minion, MD;  Location: [facility];  Service: Orthopedics;  Laterality: Right;  Right Below Knee Amputation  . BASCILIC VEIN TRANSPOSITION Left 07/22/2015   Procedure: BASILIC VEIN TRANSPOSITION ;  Surgeon: Elam Dutch, MD;  Location: [facility];  Service: Vascular;  Laterality: Left;  . CATARACT EXTRACTION     right   . CESAREAN SECTION  1986  . CESAREAN SECTION  1985  . CESAREAN SECTION  1991  . I&D EXTREMITY  12/24/2011   Procedure: IRRIGATION AND DEBRIDEMENT EXTREMITY;  Surgeon: Tennis Must, MD;  Location: [facility];  Service: Orthopedics;  Laterality: Left;  I&Dleft thumb with partial amputation  . I&D EXTREMITY  12/26/2011   Procedure: IRRIGATION AND DEBRIDEMENT EXTREMITY;  Surgeon: Tennis Must, MD;  Location: [facility];  Service: Orthopedics;  Laterality: Left;  . I&D EXTREMITY  12/23/2011   Procedure: IRRIGATION AND DEBRIDEMENT  EXTREMITY;  Surgeon: Tennis Must, MD;  Location: [facility];  Service: Orthopedics;  Laterality: Left;  I&D Left thumb, hand and arm  . I&D left thumb  12/23/2011  . TUBAL LIGATION      Family History  Problem Relation Age of Onset  . Diabetes Mother   . Alcohol abuse Mother   . Diabetes Father   . Alzheimer's disease Father   . Hypertension Father   . Kidney failure Sister   . Diabetes Sister   . High blood pressure Sister     Social History:  reports that she has never smoked. She has never used smokeless tobacco. She reports current alcohol use of about 1.0 standard drinks of alcohol per week. She reports current drug use. Drug: Cocaine.  Allergies: No Known Allergies  Medications: I have reviewed the patient's current medications.  Results for orders placed or performed during the hospital encounter of 12/06/18 (from the past 48 hour(s))  CBC with Differential     Status: Abnormal   Collection Time: 12/06/18  3:45 PM  Result Value Ref Range   WBC 21.1 (H) 4.0 - 10.5 K/uL   RBC 5.53 (H) 3.87 - 5.11 MIL/uL   Hemoglobin 12.5 12.0 - 15.0 g/dL   HCT 42.4 36.0 - 46.0 %   MCV 76.7 (L) 80.0 - 100.0 fL   MCH 22.6 (L) 26.0 - 34.0 pg  MCHC 29.5 (L) 30.0 - 36.0 g/dL   RDW 19.6 (H) 11.5 - 15.5 %   Platelets  150 - 400 K/uL    PLATELET CLUMPS NOTED ON SMEAR, COUNT APPEARS ADEQUATE    Comment: PLATELET CLUMPS NOTED ON SMEAR, UNABLE TO ESTIMATE REPEATED TO VERIFY PLATELET CLUMPS NOTED ON SMEAR, UNABLE TO ESTIMATE PLATELETS APPEAR ADEQUATE SPECIMEN CHECKED FOR CLOTS    nRBC 0.0 0.0 - 0.2 %   Neutrophils Relative % 89 %   Neutro Abs 18.7 (H) 1.7 - 7.7 K/uL   Lymphocytes Relative 5 %   Lymphs Abs 1.1 0.7 - 4.0 K/uL   Monocytes Relative 5 %   Monocytes Absolute 1.0 0.1 - 1.0 K/uL   Eosinophils Relative 0 %   Eosinophils Absolute 0.1 0.0 - 0.5 K/uL   Basophils Relative 0 %   Basophils Absolute 0.0 0.0 - 0.1 K/uL   WBC Morphology SMUDGE CELLS     Comment: VACUOLATED NEUTROPHILS    Immature Granulocytes 1 %   Abs Immature Granulocytes 0.16 (H) 0.00 - 0.07 K/uL   Polychromasia PRESENT     Comment: Performed at [facility] [address], [place], [place] [address]  I-Stat beta hCG blood, ED     Status: None   Collection Time: 12/06/18  3:53 PM  Result Value Ref Range   I-stat hCG, quantitative <5.0 <5 mIU/mL   Comment 3            Comment:   GEST. AGE      CONC.  (mIU/mL)   <=1 WEEK        5 - 50     2 WEEKS       50 - 500     3 WEEKS       100 - 10,000     4 WEEKS     1,000 - 30,000        FEMALE AND NON-PREGNANT FEMALE:     LESS THAN 5 mIU/mL   Lipase, blood     Status: None   Collection Time: 12/06/18  4:22 PM  Result Value Ref Range   Lipase 29 11 - 51 U/L    Comment: Performed at [facility], [place] [address], [place], [place] [address]  Urinalysis, Routine w reflex microscopic     Status: Abnormal   Collection Time: 12/06/18  5:30 PM  Result Value Ref Range   Color, Urine YELLOW YELLOW   APPearance TURBID (A) CLEAR   Specific Gravity, Urine 1.015 1.005 - 1.030   pH 8.0 5.0 - 8.0   Glucose, UA >=500 (A) NEGATIVE mg/dL   Hgb urine dipstick NEGATIVE NEGATIVE   Bilirubin Urine NEGATIVE NEGATIVE   Ketones, ur NEGATIVE NEGATIVE mg/dL   Protein, ur 100 (A) NEGATIVE mg/dL   Nitrite NEGATIVE NEGATIVE   Leukocytes, UA SMALL (A) NEGATIVE   RBC / HPF 6-10 0 - 5 RBC/hpf   WBC, UA 21-50 0 - 5 WBC/hpf   Bacteria, UA MANY (A) NONE SEEN   Squamous Epithelial / LPF >50 (H) 0 - 5    Comment: Performed at [facility], [place] [address], [place], [place] [address]  Comprehensive metabolic panel     Status: Abnormal   Collection Time: 12/06/18  6:08 PM  Result Value Ref Range   Sodium 140 135 - 145 mmol/L   Potassium 4.0 3.5 - 5.1 mmol/L   Chloride 98 98 - 111 mmol/L   CO2 25 22 - 32 mmol/L  Glucose, Bld 175 (H) 70 - 99 mg/dL   BUN 37 (H) 6 - 20 mg/dL   Creatinine, Ser 6.31 (H) 0.44 - 1.00 mg/dL   Calcium 9.0 8.9 - 10.3 mg/dL   Total Protein  7.4 6.5 - 8.1 g/dL   Albumin 3.5 3.5 - 5.0 g/dL   AST 15 15 - 41 U/L   ALT 12 0 - 44 U/L   Alkaline Phosphatase 70 38 - 126 U/L   Total Bilirubin 0.4 0.3 - 1.2 mg/dL   GFR calc non Af Amer 7 (L) >60 mL/min   GFR calc Af Amer 8 (L) >60 mL/min   Anion gap 17 (H) 5 - 15    Comment: Performed at [facility] [address], [place], [place] [address]    Ct Abdomen Pelvis Wo Contrast  Result Date: 12/06/2018 CLINICAL DATA:  Acute onset of RIGHT LOWER QUADRANT abdominal pain that began yesterday during hemodialysis. Solitary episode of vomiting. EXAM: CT ABDOMEN AND PELVIS WITHOUT CONTRAST TECHNIQUE: Multidetector CT imaging of the abdomen and pelvis was performed following the standard protocol without IV contrast. COMPARISON:  None. FINDINGS: Lower chest: Atelectasis involving the lower lobes. Visualized lung bases otherwise clear. Heart moderately enlarged. Hepatobiliary: Normal unenhanced appearance of the liver. Prominent Riedel's lobe. Gallbladder normal in appearance without calcified gallstones. No biliary ductal dilation. Pancreas: Normal unenhanced appearance. Spleen: Normal unenhanced appearance. Adrenals/Urinary Tract: Normal appearing adrenal glands. No evidence of urinary tract calculi. Within the limits of the unenhanced technique, no focal parenchymal abnormality involving either kidney. No evidence of hydronephrosis involving either kidney. Normal appearing urinary bladder. Stomach/Bowel: Very small hiatal hernia. Stomach decompressed and otherwise unremarkable. Normal-appearing small bowel. Mobile cecum positioned in the RIGHT UPPER QUADRANT of the abdomen. Moderate stool burden throughout the normal appearing colon. Appendix: Location: RIGHT UPPER pelvis, LATERAL retrocecal location. Diameter: 16 mm Appendicolith: Multiple. Mucosal hyper-enhancement: Moderate. Extraluminal gas: Absent. Periappendiceal collection: Absent. Marked edema/inflammation surrounding the appendix.  Vascular/Lymphatic: Mild to moderate iliofemoral atherosclerosis. No evidence of aortic aneurysm. No pathologic lymphadenopathy. Reproductive: Normal-appearing uterus and ovaries without evidence of adnexal mass. Other: None. Musculoskeletal: BILATERAL L4 pars defects without evidence of spondylolisthesis. Mild degenerative disc disease at L4-5 with circumferential disc bulge. Mild multifactorial spinal stenosis at the L4-5 level. No acute findings. IMPRESSION: 1. Acute appendicitis. No evidence of perforation or abscess. 2. Atelectasis involving the lower lobes. 3. Very small hiatal hernia. 4. BILATERAL L4 pars defects without evidence of spondylolisthesis. Mild multifactorial spinal stenosis at L4-5. I telephoned these critical/emergent results to [facility], [place], in the emergency department at the time of interpretation on 12/06/2018 at 7:12 p.m. Electronically Signed   By: Evangeline Dakin M.D.   On: 12/06/2018 19:13    Review of Systems  Constitutional: Negative for chills and fever.  HENT: Negative for hearing loss.   Eyes: Negative for blurred vision and double vision.  Respiratory: Negative for cough and hemoptysis.   Cardiovascular: Negative for chest pain and palpitations.  Gastrointestinal: Positive for abdominal pain, nausea and vomiting.  Genitourinary: Negative for dysuria and urgency.  Musculoskeletal: Negative for myalgias and neck pain.  Skin: Negative for itching and rash.  Neurological: Negative for dizziness, tingling and headaches.  Endo/Heme/Allergies: Does not bruise/bleed easily.  Psychiatric/Behavioral: Negative for depression and suicidal ideas.   Blood pressure (!) 163/74, pulse (!) 106, temperature 99.1 F (37.3 C), resp. rate 16, last menstrual period 08/06/2018, SpO2 94 %. Physical Exam  Vitals reviewed. Constitutional: She is oriented to person, place, and  time. She appears well-developed and well-nourished.  HENT:  Head: Normocephalic and atraumatic.  Eyes:  Pupils are equal, round, and reactive to light. Conjunctivae and EOM are normal.  Neck: Normal range of motion. Neck supple.  Cardiovascular: Normal rate and regular rhythm.  Respiratory: Effort normal and breath sounds normal.  GI: Soft. Bowel sounds are normal. She exhibits no distension. There is abdominal tenderness in the right lower quadrant. There is guarding and tenderness at McBurney's point.  Musculoskeletal: Normal range of motion.  Neurological: She is alert and oriented to person, place, and time.  Skin: Skin is warm and dry.  Psychiatric: She has a normal mood and affect. Her behavior is normal.    Assessment/Plan: 53 yo female with 2 days of abdominal pain, leukocytosis and dilated appendicitis consistent with acute appendicitis -IV abx -we discussed the details of the procedure; that it would be done under general anesthesia, that we would attempt to do the procedure laparoscopically. That the appendix would be isolated from the large and small intestine and then ligated and removed. We discussed the reason for this is to avoid rupture and infection and resolve the pains. We discussed risks of infection, abscess, injury to intestines or urinary structures, and need for open incision. She showed good understanding and wanted to proceed.   Arta Bruce Alijah Akram 12/06/2018, 7:52 PM

## 2018-12-06 NOTE — ED Provider Notes (Signed)
  Face-to-face evaluation   History: She presents for evaluation of abdominal pain.  Pain started yesterday during dialysis.  He states she vomited a little bit this morning.  Last bowel movement was 3 days ago and small in amount.  She has chronic constipation for which she takes Epson salts or prune juice.  She denies fever, chills, weakness or paresthesia.  Physical exam: Somewhat overweight patient.  Alert, calm, cooperative.  Abdomen soft.  Moderate tenderness right lower quadrant, with light palpation of the abdominal wall.  Medical screening examination/treatment/procedure(s) were conducted as a shared visit with non-physician practitioner(s) and myself.  I personally evaluated the patient during the encounter    Daleen Bo, MD 12/09/18 [phone]

## 2018-12-06 NOTE — ED Provider Notes (Signed)
[facility] EMERGENCY DEPARTMENT Provider Note   CSN: [id number] Arrival date & time: 12/06/18  1258     History   Chief Complaint Chief Complaint  Patient presents with  . Abdominal Pain    RLQ    HPI [name] is a 53 y.o. female with a PMHx of ESRD on dialysis M/W/F, legal blindness, DM2, HTN, anemia, HLD, and other conditions listed below, who presents to the ED with complaints of RLQ pain that began yesterday after dialysis.  Her dialysis schedule has been adjusted this week because of the holidays, she completed it on Sunday and Tuesday (yesterday) and is due again on Friday.  Yesterday after she finished dialysis she started having RLQ pain which she describes as 10/10 constant achy and sore nonradiating RLQ pain that worsens with movement and has been unrelieved with Tylenol and Goody's powders.  She reports nausea and one episode of emesis.  She cannot see so she cannot tell whether there was any blood in the emesis.  She also reports one episode of chills yesterday but that has resolved.  She also mentions that she is chronically constipated, last had a bowel movement 2 days ago, takes epsom salts and prune juice for this.  She denies any fevers, chest pain, shortness of breath, diarrhea, obstipation, dysuria, vaginal discharge, myalgias, arthralgias, numbness, tingling, focal weakness, or any other complaints at this time.  She denies any recent travel, sick contacts, suspicious food intake, alcohol use, or NSAID use.  [facility].  The history is provided by the patient and medical records. No language interpreter was used.  Abdominal Pain   Associated symptoms include nausea, vomiting and constipation. Pertinent negatives include fever, diarrhea, dysuria, arthralgias and myalgias.    Past Medical History:  Diagnosis Date  . Alcohol dependency ([place])   . Anemia   . Chronic kidney disease   . Diabetes mellitus    type 2  . Diabetes mellitus type 2, uncontrolled ([place])   . Hypertension   . Legally blind in left eye, as defined in [place]   . Necrotizing fasciitis ([place])   . PONV (postoperative nausea and vomiting)     Patient Active Problem List   Diagnosis Date Noted  . Chronic cough 10/26/2017  . Healthcare maintenance 05/24/2017  . Diabetic peripheral neuropathy ([place]) 03/17/2017  . Hyperlipidemia 03/17/2017  . S/P BKA (below knee amputation) unilateral, right ([place]) 01/04/2017  . ESRD on dialysis ([place]) 01/04/2017  . History of alcohol abuse 01/04/2017  . Legally blind 01/04/2017  . Type 2 diabetes mellitus ([place]) 09/01/2011  . Hypertension 09/01/2011  . Anemia     Past Surgical History:  Procedure Laterality Date  . AMPUTATION     right first and left middle finger amputation 2/2 OM 08/2011 by Dr. Amedeo Plenty  . AMPUTATION  01/18/2012   Procedure: AMPUTATION DIGIT;  Surgeon: Tennis Must, MD;  Location: [facility];  Service: Orthopedics;  Laterality: Left;  revision amputation left thumb  . AMPUTATION  08/25/2012   Procedure: AMPUTATION BELOW KNEE;  Surgeon: Newt Minion, MD;  Location: [facility];  Service: Orthopedics;  Laterality: Right;  Right Below Knee Amputation  . BASCILIC VEIN TRANSPOSITION Left 07/22/2015   Procedure: BASILIC VEIN TRANSPOSITION ;  Surgeon: Elam Dutch, MD;  Location: [facility];  Service: Vascular;  Laterality: Left;  . CATARACT EXTRACTION     right   . CESAREAN SECTION  1986  .  CESAREAN SECTION  1985  . CESAREAN SECTION  1991  . I&D EXTREMITY  12/24/2011   Procedure: IRRIGATION AND DEBRIDEMENT EXTREMITY;  Surgeon: Tennis Must, MD;  Location: [facility];  Service: Orthopedics;  Laterality: Left;  I&Dleft thumb with partial amputation  . I&D EXTREMITY  12/26/2011   Procedure: IRRIGATION AND DEBRIDEMENT EXTREMITY;  Surgeon: Tennis Must, MD;  Location: [facility];  Service: Orthopedics;  Laterality: Left;  . I&D EXTREMITY  12/23/2011   Procedure: IRRIGATION AND  DEBRIDEMENT EXTREMITY;  Surgeon: Tennis Must, MD;  Location: [place] [facility];  Service: Orthopedics;  Laterality: Left;  I&D Left thumb, hand and arm  . I&D left thumb  12/23/2011  . TUBAL LIGATION       OB History   No obstetric history on file.      Home Medications    Prior to Admission medications   Medication Sig Start Date End Date Taking? Authorizing Provider  amLODipine (NORVASC) 10 MG tablet TAKE 1 TABLET BY MOUTH ONCE DAILY 05/11/18   Rory Percy, DO  atorvastatin (LIPITOR) 40 MG tablet TAKE 1 TABLET BY MOUTH ONCE DAILY 05/11/18   Rumball, Alison, DO  AURYXIA 1 GM 210 MG(Fe) tablet TAKE 2 TABLETS BY MOUTH THREE TIMES A DAY WITH MEALS AND 1 TABLET TWICE A DAY WITH SNACKS.   SWALLOW WHOLE, DO NOT CHEW OR CRUSH MEDICATION 10/06/18   [provider]  B Complex-C-Folic Acid (DIALYVITE 867) 0.8 MG TABS Take 1 tablet by mouth daily. 10/09/18   [provider]  Blood Glucose Monitoring Suppl (PRODIGY AUTOCODE BLOOD GLUCOSE) w/Device KIT 1 Device by Does not apply route 3 (three) times daily. 10/26/18   Rory Percy, DO  gabapentin (NEURONTIN) 100 MG capsule TAKE 1 CAPSULE BY MOUTH ONCE DAILY AS NEEDED 11/01/18   Rory Percy, DO  glucose blood test strip Use as instructed 10/26/18   Rory Percy, DO  HUMULIN 70/30 KWIKPEN (70-30) 100 UNIT/ML PEN INJECT 10 UNITS SUBCUTANEOUSLY AT [facility] AND AT SUPPER 11/01/18   Rory Percy, DO  Insulin Pen Needle (EASY TOUCH PEN NEEDLES) 32G X 4 MM MISC 1 each by Does not apply route 3 (three) times daily. 08/10/18   Rory Percy, DO  Lancet Devices (ONE TOUCH DELICA LANCING DEV) MISC Check blood sugars once daily 05/25/18   Mayo, Pete Pelt, MD  losartan (COZAAR) 50 MG tablet Take 1 tablet (50 mg total) by mouth daily. 08/10/18   Rory Percy, DO  metoprolol tartrate (LOPRESSOR) 50 MG tablet TAKE 1 TABLET BY MOUTH TWICE DAILY 08/10/18   Rory Percy, DO  mupirocin cream (BACTROBAN) 2 % Apply 1 application topically 2 (two) times  daily. 10/26/17   Mayo, Pete Pelt, MD  nortriptyline (PAMELOR) 25 MG capsule TAKE 1 CAPSULE BY MOUTH AT BEDTIME 10/12/18   Rory Percy, DO  pantoprazole (PROTONIX) 20 MG tablet Take 1 tablet (20 mg total) by mouth daily. 10/26/17   Mayo, Pete Pelt, MD  PRODIGY LANCETS 28G MISC 1 each by Does not apply route 3 (three) times daily. 10/26/18   Rory Percy, DO    Family History Family History  Problem Relation Age of Onset  . Diabetes Mother   . Alcohol abuse Mother   . Diabetes Father   . Alzheimer's disease Father   . Hypertension Father   . Kidney failure Sister   . Diabetes Sister   . High blood pressure Sister     Social History Social History   Tobacco Use  . Smoking status: Never  Smoker  . Smokeless tobacco: Never Used  Substance Use Topics  . Alcohol use: Yes    Alcohol/week: 1.0 standard drinks    Types: 1 Cans of beer per week    Comment: h/o alcohol abuse 4 years ago (07/21/15)  . Drug use: Yes    Types: Cocaine    Comment:  last used cocaine 4 years ago (07/21/15)     Allergies   Patient has no known allergies.   Review of Systems Review of Systems  Constitutional: Negative for chills and fever.  Respiratory: Negative for shortness of breath.   Cardiovascular: Negative for chest pain.  Gastrointestinal: Positive for abdominal pain, constipation, nausea and vomiting. Negative for diarrhea.  Genitourinary: Negative for dysuria and vaginal discharge.  Musculoskeletal: Negative for arthralgias and myalgias.  Skin: Negative for color change.  Allergic/Immunologic: Positive for immunocompromised state (DM2).  Neurological: Negative for weakness and numbness.  Psychiatric/Behavioral: Negative for confusion.   All other systems reviewed and are negative for acute change except as noted in the HPI.    Physical Exam Updated Vital Signs BP (!) 170/79 (BP Location: Right Arm)   Pulse 97   Temp 99.1 F (37.3 C)   Resp 16   LMP 08/06/2018   SpO2 100%    Physical Exam Vitals signs and nursing note reviewed.  Constitutional:      General: She is not in acute distress.    Appearance: Normal appearance. She is well-developed. She is not toxic-appearing.     Comments: Afebrile, nontoxic, NAD  HENT:     Head: Normocephalic and atraumatic.  Eyes:     General:        Right eye: No discharge.        Left eye: No discharge.     Conjunctiva/sclera: Conjunctivae normal.  Neck:     Musculoskeletal: Normal range of motion and neck supple.  Cardiovascular:     Rate and Rhythm: Normal rate and regular rhythm.     Pulses: Normal pulses.     Heart sounds: Normal heart sounds, S1 normal and S2 normal. No murmur. No friction rub. No gallop.   Pulmonary:     Effort: Pulmonary effort is normal. No respiratory distress.     Breath sounds: Normal breath sounds. No decreased breath sounds, wheezing, rhonchi or rales.  Abdominal:     General: Bowel sounds are normal. There is no distension.     Palpations: Abdomen is soft. Abdomen is not rigid.     Tenderness: There is abdominal tenderness in the right lower quadrant. There is guarding. There is no right CVA tenderness, left CVA tenderness or rebound. Positive signs include McBurney's sign. Negative signs include Murphy's sign.       Comments: Soft, nondistended, +BS throughout, with moderate RLQ TTP near mcburney's point, some voluntary guarding, no rebound or rigidity, neg murphy's, no CVA TTP   Musculoskeletal: Normal range of motion.  Skin:    General: Skin is warm and dry.     Findings: No rash.  Neurological:     Mental Status: She is alert and oriented to person, place, and time.     Sensory: Sensation is intact. No sensory deficit.     Motor: Motor function is intact.  Psychiatric:        Mood and Affect: Mood and affect normal.        Behavior: Behavior normal.      ED Treatments / Results  Labs (all labs ordered are listed, but only abnormal results  are displayed) Labs Reviewed   URINALYSIS, ROUTINE W REFLEX MICROSCOPIC - Abnormal; Notable for the following components:      Result Value   APPearance TURBID (*)    Glucose, UA >=500 (*)    Protein, ur 100 (*)    Leukocytes, UA SMALL (*)    Bacteria, UA MANY (*)    Squamous Epithelial / LPF >50 (*)    All other components within normal limits  CBC WITH DIFFERENTIAL/PLATELET - Abnormal; Notable for the following components:   WBC 21.1 (*)    RBC 5.53 (*)    MCV 76.7 (*)    MCH 22.6 (*)    MCHC 29.5 (*)    RDW 19.6 (*)    Neutro Abs 18.7 (*)    Abs Immature Granulocytes 0.16 (*)    All other components within normal limits  COMPREHENSIVE METABOLIC PANEL - Abnormal; Notable for the following components:   Glucose, Bld 175 (*)    BUN 37 (*)    Creatinine, Ser 6.31 (*)    GFR calc non Af Amer 7 (*)    GFR calc Af Amer 8 (*)    Anion gap 17 (*)    All other components within normal limits  LIPASE, BLOOD  I-STAT BETA HCG BLOOD, ED ([facility], [facility], [facility] ONLY)    EKG None  Radiology Ct Abdomen Pelvis Wo Contrast  Result Date: 12/06/2018 CLINICAL DATA:  Acute onset of RIGHT LOWER QUADRANT abdominal pain that began yesterday during hemodialysis. Solitary episode of vomiting. EXAM: CT ABDOMEN AND PELVIS WITHOUT CONTRAST TECHNIQUE: Multidetector CT imaging of the abdomen and pelvis was performed following the standard protocol without IV contrast. COMPARISON:  None. FINDINGS: Lower chest: Atelectasis involving the lower lobes. Visualized lung bases otherwise clear. Heart moderately enlarged. Hepatobiliary: Normal unenhanced appearance of the liver. Prominent Riedel's lobe. Gallbladder normal in appearance without calcified gallstones. No biliary ductal dilation. Pancreas: Normal unenhanced appearance. Spleen: Normal unenhanced appearance. Adrenals/Urinary Tract: Normal appearing adrenal glands. No evidence of urinary tract calculi. Within the limits of the unenhanced technique, no focal parenchymal abnormality involving either  kidney. No evidence of hydronephrosis involving either kidney. Normal appearing urinary bladder. Stomach/Bowel: Very small hiatal hernia. Stomach decompressed and otherwise unremarkable. Normal-appearing small bowel. Mobile cecum positioned in the RIGHT UPPER QUADRANT of the abdomen. Moderate stool burden throughout the normal appearing colon. Appendix: Location: RIGHT UPPER pelvis, LATERAL retrocecal location. Diameter: 16 mm Appendicolith: Multiple. Mucosal hyper-enhancement: Moderate. Extraluminal gas: Absent. Periappendiceal collection: Absent. Marked edema/inflammation surrounding the appendix. Vascular/Lymphatic: Mild to moderate iliofemoral atherosclerosis. No evidence of aortic aneurysm. No pathologic lymphadenopathy. Reproductive: Normal-appearing uterus and ovaries without evidence of adnexal mass. Other: None. Musculoskeletal: BILATERAL L4 pars defects without evidence of spondylolisthesis. Mild degenerative disc disease at L4-5 with circumferential disc bulge. Mild multifactorial spinal stenosis at the L4-5 level. No acute findings. IMPRESSION: 1. Acute appendicitis. No evidence of perforation or abscess. 2. Atelectasis involving the lower lobes. 3. Very small hiatal hernia. 4. BILATERAL L4 pars defects without evidence of spondylolisthesis. Mild multifactorial spinal stenosis at L4-5. I telephoned these critical/emergent results to [facility], [place], in the emergency department at the time of interpretation on 12/06/2018 at 7:12 p.m. Electronically Signed   By: Evangeline Dakin M.D.   On: 12/06/2018 19:13    Procedures Procedures (including critical care time)  Medications Ordered in ED Medications  morphine 4 MG/ML injection 4 mg (has no administration in time range)  ondansetron (ZOFRAN) injection 4 mg (has no administration in time range)  piperacillin-tazobactam (ZOSYN) IVPB 3.375 g (has no administration in time range)  fentaNYL (SUBLIMAZE) injection 50 mcg (50 mcg Intramuscular Given  12/06/18 1622)  ondansetron (ZOFRAN) injection 4 mg (4 mg Intravenous Given 12/06/18 1622)     Initial Impression / Assessment and Plan / ED Course  I have reviewed the triage vital signs and the nursing notes.  Pertinent labs & imaging results that were available during my care of the patient were reviewed by me and considered in my medical decision making (see chart for details).     53 y.o. female here with RLQ pain that began yesterday after dialysis.  On exam, moderate RLQ tenderness over McBurney's point, voluntary guarding, no rigidity or rebound.  No other areas of tenderness to the remainder of the abdomen.  Will obtain labs and CT abdomen pelvis, give pain medicine and nausea medicine, and reassess shortly. Discussed case with my attending Dr. Eulis Foster who agrees with plan.   7:22 PM CBC w/diff with neutrophilic leukocytosis WBC 21.1. CMP with Cr 6.31/BUN 37 close to usual baseline, mildly elevated anion gap probably from this; no changes in bicarb; gluc 175. Lipase WNL. U/A grossly contaminated with >50 squamous cells, 21-50 WBCs and small leuks, many bacteria seen, but this is likely a contaminated sample. BetaHCG neg. CT abd/pelv confirming acute appendicitis without perf or abscess; will start abx and consult surgery. Pt requesting more pain/nausea meds, will order these.  7:27 PM Dr. Kieth Brightly of surgery returning page, will consult on pt but would appreciate medical admission for this slightly more medically complicated pt. Will reach out to family medicine residency.   7:32 PM Dr. Jeannine Kitten of [facility] residency service returning page and will admit. Holding orders to be placed by admitting team. Please see their notes for further documentation of care. I appreciate their help with this pleasant pt's care. Pt stable at time of admission.    Final Clinical Impressions(s) / ED Diagnoses   Final diagnoses:  Acute right lower quadrant pain  Acute appendicitis, unspecified acute appendicitis  type  Neutrophilic leukocytosis    ED Discharge Orders    [address] [place], [place] 12/06/18 1933    Daleen Bo, MD 12/09/18 [phone]

## 2018-12-07 ENCOUNTER — Encounter (HOSPITAL_COMMUNITY): Payer: Self-pay | Admitting: General Surgery

## 2018-12-07 ENCOUNTER — Ambulatory Visit: Payer: Medicare Other | Admitting: Family Medicine

## 2018-12-07 ENCOUNTER — Other Ambulatory Visit: Payer: Self-pay

## 2018-12-07 DIAGNOSIS — N186 End stage renal disease: Secondary | ICD-10-CM

## 2018-12-07 DIAGNOSIS — Z794 Long term (current) use of insulin: Secondary | ICD-10-CM

## 2018-12-07 DIAGNOSIS — K3580 Unspecified acute appendicitis: Secondary | ICD-10-CM

## 2018-12-07 DIAGNOSIS — E1159 Type 2 diabetes mellitus with other circulatory complications: Secondary | ICD-10-CM

## 2018-12-07 DIAGNOSIS — I739 Peripheral vascular disease, unspecified: Secondary | ICD-10-CM

## 2018-12-07 LAB — BASIC METABOLIC PANEL
Anion gap: 18 — ABNORMAL HIGH (ref 5–15)
BUN: 48 mg/dL — ABNORMAL HIGH (ref 6–20)
CO2: 24 mmol/L (ref 22–32)
Calcium: 8.6 mg/dL — ABNORMAL LOW (ref 8.9–10.3)
Chloride: 97 mmol/L — ABNORMAL LOW (ref 98–111)
Creatinine, Ser: 7.18 mg/dL — ABNORMAL HIGH (ref 0.44–1.00)
GFR calc Af Amer: 7 mL/min — ABNORMAL LOW (ref >60)
GFR calc non Af Amer: 6 mL/min — ABNORMAL LOW (ref >60)
Glucose, Bld: 294 mg/dL — ABNORMAL HIGH (ref 70–99)
Potassium: 4.8 mmol/L (ref 3.5–5.1)
Sodium: 139 mmol/L (ref 135–145)

## 2018-12-07 LAB — CBC
HCT: 40.3 % (ref 36.0–46.0)
Hemoglobin: 12.4 g/dL (ref 12.0–15.0)
MCH: 23.7 pg — ABNORMAL LOW (ref 26.0–34.0)
MCHC: 30.8 g/dL (ref 30.0–36.0)
MCV: 76.9 fL — ABNORMAL LOW (ref 80.0–100.0)
Platelets: 171 10*3/uL (ref 150–400)
RBC: 5.24 MIL/uL — ABNORMAL HIGH (ref 3.87–5.11)
RDW: 18.2 % — ABNORMAL HIGH (ref 11.5–15.5)
WBC: 19.9 10*3/uL — ABNORMAL HIGH (ref 4.0–10.5)
nRBC: 0 % (ref 0.0–0.2)

## 2018-12-07 LAB — HIV ANTIBODY (ROUTINE TESTING W REFLEX): HIV Screen 4th Generation wRfx: NONREACTIVE

## 2018-12-07 LAB — GLUCOSE, CAPILLARY
Glucose-Capillary: 294 mg/dL — ABNORMAL HIGH (ref 70–99)
Glucose-Capillary: 272 mg/dL — ABNORMAL HIGH (ref 70–99)
Glucose-Capillary: 267 mg/dL — ABNORMAL HIGH (ref 70–99)
Glucose-Capillary: 191 mg/dL — ABNORMAL HIGH (ref 70–99)
Glucose-Capillary: 221 mg/dL — ABNORMAL HIGH (ref 70–99)

## 2018-12-07 LAB — MRSA PCR SCREENING: MRSA by PCR: NEGATIVE

## 2018-12-07 MED ORDER — CALCITRIOL 0.5 MCG PO CAPS
1.0000 ug | ORAL_CAPSULE | ORAL | Status: DC
  Administered 2018-12-08: 1 ug via ORAL
  Filled 2018-12-07: qty 2

## 2018-12-07 MED ORDER — DOCUSATE SODIUM 100 MG PO CAPS
100.0000 mg | ORAL_CAPSULE | Freq: Every day | ORAL | Status: DC
  Administered 2018-12-07 – 2018-12-09 (×3): 100 mg via ORAL
  Filled 2018-12-07 (×3): qty 1

## 2018-12-07 MED ORDER — OXYCODONE HCL 5 MG PO TABS
2.5000 mg | ORAL_TABLET | ORAL | Status: DC | PRN
  Administered 2018-12-07: 2.5 mg via ORAL
  Administered 2018-12-07: 5 mg via ORAL
  Administered 2018-12-08 – 2018-12-09 (×4): 2.5 mg via ORAL
  Filled 2018-12-07 (×5): qty 1

## 2018-12-07 MED ORDER — ACETAMINOPHEN 325 MG PO TABS
650.0000 mg | ORAL_TABLET | Freq: Four times a day (QID) | ORAL | Status: DC
  Administered 2018-12-07 – 2018-12-09 (×9): 650 mg via ORAL
  Filled 2018-12-07 (×8): qty 2

## 2018-12-07 MED ORDER — NEPRO/CARBSTEADY PO LIQD
237.0000 mL | Freq: Two times a day (BID) | ORAL | Status: DC
  Administered 2018-12-07 – 2018-12-09 (×4): 237 mL via ORAL
  Filled 2018-12-07 (×5): qty 237

## 2018-12-07 MED ORDER — RENA-VITE PO TABS
1.0000 | ORAL_TABLET | Freq: Every day | ORAL | Status: DC
  Administered 2018-12-07 – 2018-12-08 (×2): 1 via ORAL
  Filled 2018-12-07 (×2): qty 1

## 2018-12-07 MED ORDER — POLYETHYLENE GLYCOL 3350 17 G PO PACK
17.0000 g | PACK | Freq: Every day | ORAL | Status: DC
  Administered 2018-12-07 – 2018-12-09 (×2): 17 g via ORAL
  Filled 2018-12-07 (×2): qty 1

## 2018-12-07 MED ORDER — INSULIN GLARGINE 100 UNIT/ML ~~LOC~~ SOLN
8.0000 [IU] | Freq: Every day | SUBCUTANEOUS | Status: DC
  Administered 2018-12-07 – 2018-12-09 (×3): 8 [IU] via SUBCUTANEOUS
  Filled 2018-12-07 (×3): qty 0.08

## 2018-12-07 MED ORDER — CHLORHEXIDINE GLUCONATE CLOTH 2 % EX PADS: 6.0000 | MEDICATED_PAD | Freq: Every day | CUTANEOUS | Status: DC

## 2018-12-07 MED ORDER — FERRIC CITRATE 1 GM 210 MG(FE) PO TABS
420.0000 mg | ORAL_TABLET | Freq: Three times a day (TID) | ORAL | Status: DC
  Administered 2018-12-07 – 2018-12-09 (×4): 420 mg via ORAL
  Filled 2018-12-07 (×7): qty 2

## 2018-12-07 MED ORDER — INSULIN ASPART 100 UNIT/ML ~~LOC~~ SOLN
0.0000 [IU] | Freq: Three times a day (TID) | SUBCUTANEOUS | Status: DC
  Administered 2018-12-07: 8 [IU] via SUBCUTANEOUS
  Administered 2018-12-07: 3 [IU] via SUBCUTANEOUS
  Administered 2018-12-08: 2 [IU] via SUBCUTANEOUS
  Administered 2018-12-09: 5 [IU] via SUBCUTANEOUS
  Administered 2018-12-09: 3 [IU] via SUBCUTANEOUS

## 2018-12-07 NOTE — Anesthesia Postprocedure Evaluation (Signed)
Anesthesia Post Note  Patient: [name]  Procedure(s) Performed: APPENDECTOMY LAPAROSCOPIC (N/A Abdomen)     Patient location during evaluation: PACU Anesthesia Type: General Level of consciousness: sedated Pain management: pain level controlled Vital Signs Assessment: post-procedure vital signs reviewed and stable Respiratory status: spontaneous breathing and respiratory function stable Cardiovascular status: stable Postop Assessment: no apparent nausea or vomiting Anesthetic complications: no    Last Vitals:  Vitals:   12/06/18 2245 12/06/18 2312  BP: (!) 155/87 129/64  Pulse: 100 97  Resp: 19 18  Temp:  37.1 C  SpO2: 92% 96%    Last Pain:  Vitals:   12/06/18 2312  TempSrc: Oral  PainSc:                  Baily Serpe DANIEL

## 2018-12-07 NOTE — Progress Notes (Signed)
Inpatient Diabetes Program Recommendations  AACE/ADA: New Consensus Statement on Inpatient Glycemic Control (2015)  Target Ranges:  Prepandial:   less than 140 mg/dL      Peak postprandial:   less than 180 mg/dL (1-2 hours)      Critically ill patients:  140 - 180 mg/dL   Lab Results  Component Value Date   GLUCAP 294 (H) 12/07/2018   HGBA1C 7.7 (A) 10/26/2018    Review of Glycemic Control Results for [name], [place] (MRN [id number]) as of 12/07/2018 10:26  Ref. Range 12/06/2018 22:34 12/06/2018 23:54 12/07/2018 07:37  Glucose-Capillary Latest Ref Range: 70 - 99 mg/dL 192 (H) 278 (H) 294 (H)   Diabetes history: DM2 Outpatient Diabetes medications: Humulin 70/30 insulin 10 units bid Current orders for Inpatient glycemic control: Novolog sensitive correction tid  Inpatient Diabetes Program Recommendations:  While in the hospital & off of home 70/30 insulin -Levemir 7 units bid (total basal portion of 70/30)  Thank you, Bethena Roys E. Kert Shackett, RN, MSN, CDE  Diabetes Coordinator Inpatient Glycemic Control Team Team Pager [phone] (8am-5pm) 12/07/2018 10:34 AM\

## 2018-12-07 NOTE — Progress Notes (Addendum)
Family Medicine Teaching Service Daily Progress Note Intern Pager: [phone]  Patient name: [name] Medical record number: [id number] Date of birth: 09-29-1966 Age: 53 y.o. Gender: female  Primary Care Provider: Rory Percy, DO Consultants: Gen Surgery Code Status: Full  Pt Overview and Major Events to Date:  1/1 Admitted to [place] [facility] 1/1 Appendectomy  Assessment and Plan: [name] is a 53 y.o. female presenting with acute appendicitis, now s/p appendectomy 1/1. PMH is significant for ESRD, DM-II, HTN, HLD, BKA, blindness, alcohol dependency.   Gangrenous Appendicitis S/P appendectomy 1/1.  Op note states feculent material poured from appendix during procedure.  Remains afebrile with WBC 21.1>19.9. Requiring prns of hydrocodone/acetaminophen q4h. Patient states that her pain is not well-controlled, but also notes the pain is at 2/10 at present.  Drain with serosanguineous fluid.  Some blood noted on bandage in LLQ. - clear liquid diet, advance as tolerated - surgery consulted, taken to OR for surgery tonight, appreciate recs - zosyn (1/1- ) - zofran prn naursa - schedule tylenol 650mg  q6h - oxy IR 2.5mg  q4h prn  ESRD on HD - patient gets HD MWF. Last had HD 12/31 for holiday schedule. Creatinine 6.31>7.18 (baseline is 6-7). Takes dialyvite 800, auryxia tablet. - nephro following for HD needs - continue dialyvite 800 - continue auryxia - morning BMP  DM-II - patient on 10 U  Humulin 70/30 BID. A1c on 11/21 is 7.7.   CBG this AM 294. Starting clear liquid diet this AM. - increase to moderate SSI - start Lantus 8q QD - CBG w/ meal and qhs.  - holding gabapentin for neuropathy, is prn med, will restart as needed  Alcohol dependency - patient states she has not had a drink of alcohol in over 6 years.  - ciwa scores  - monitor for signs of alcohol withdrawal.   HTN  - patient on amlodipine 10mg , losartan 50mg  qdaily, and lopressor 50mg  BID at home.  BP this AM  163/79 - restart amlodipine 10mg  QD - restart losartan 50mg  QD - cont metoprolol 25mg  BID, increase as tolerated to home dose  HLD - patient on lipitor 40mg  at home.  Lipid profile in June 2019: 163 total cholesterol, 79 LDL, triglycerides 188.   - restart lipitor 40mg  QD  Blindness - patient can only see shapes out of right eye. - out of bed with assistance only.      Constipation - patient complains of not having bowel movements regularly, takes stool softeners at home - MiraLAX daily -Colace daily  GERD - patient takes protonix 20mg  at home.  - continue protonix    FEN/GI: clear liquids, advance as tolerated PPx: heparin  Disposition: pending completion of IV antibiotics  Subjective:  Patient notes that her pain is not well controlled this morning.  Does state that her pain is 2/10 at present.  States she is feeling better since surgery.  Is starting to drink liquids without problem.  Patient does note that she has constipation at baseline and believes that this will be a problem following surgery.  Otherwise has no complaints.  Has not passed gas or had a bowel movement.  Objective: Temp:  [97.9 F (36.6 C)-99.9 F (37.7 C)] 98.3 F (36.8 C) (01/02 0736) Pulse Rate:  [91-108] 91 (01/02 0736) Resp:  [16-19] 18 (01/02 0736) BP: (129-173)/(64-92) 163/79 (01/02 0736) SpO2:  [92 %-100 %] 94 % (01/02 0736)   I/O last 3 completed shifts: In: 400 [I.V.:400] Out: 23 [Drains:55; Blood:20] Total I/O In:  520 [P.O.:520] Out: 75 [Drains:75]    Physical Exam: General: 53 y.o. female in NAD Cardio: RRR no m/r/g Lungs: CTAB, no wheezing, no rhonchi, no crackles Abdomen: Soft, minimally tender to palpation of lower abdomen, decreased bowel sounds, drain in place with serosanguineous fluid  Skin: warm and dry Extremities: No edema   Laboratory: Recent Labs  Lab 12/06/18 1545 12/07/18 0507  WBC 21.1* 19.9*  HGB 12.5 12.4  HCT 42.4 40.3  PLT PLATELET CLUMPS NOTED ON  SMEAR, COUNT APPEARS ADEQUATE 171   Recent Labs  Lab 12/06/18 1808 12/07/18 0507  NA 140 139  K 4.0 4.8  CL 98 97*  CO2 25 24  BUN 37* 48*  CREATININE 6.31* 7.18*  CALCIUM 9.0 8.6*  PROT 7.4  --   BILITOT 0.4  --   ALKPHOS 70  --   ALT 12  --   AST 15  --   GLUCOSE 175* 294*    Imaging/Diagnostic Tests: Ct Abdomen Pelvis Wo Contrast  Result Date: 12/06/2018 CLINICAL DATA:  Acute onset of RIGHT LOWER QUADRANT abdominal pain that began yesterday during hemodialysis. Solitary episode of vomiting. EXAM: CT ABDOMEN AND PELVIS WITHOUT CONTRAST TECHNIQUE: Multidetector CT imaging of the abdomen and pelvis was performed following the standard protocol without IV contrast. COMPARISON:  None. FINDINGS: Lower chest: Atelectasis involving the lower lobes. Visualized lung bases otherwise clear. Heart moderately enlarged. Hepatobiliary: Normal unenhanced appearance of the liver. Prominent Riedel's lobe. Gallbladder normal in appearance without calcified gallstones. No biliary ductal dilation. Pancreas: Normal unenhanced appearance. Spleen: Normal unenhanced appearance. Adrenals/Urinary Tract: Normal appearing adrenal glands. No evidence of urinary tract calculi. Within the limits of the unenhanced technique, no focal parenchymal abnormality involving either kidney. No evidence of hydronephrosis involving either kidney. Normal appearing urinary bladder. Stomach/Bowel: Very small hiatal hernia. Stomach decompressed and otherwise unremarkable. Normal-appearing small bowel. Mobile cecum positioned in the RIGHT UPPER QUADRANT of the abdomen. Moderate stool burden throughout the normal appearing colon. Appendix: Location: RIGHT UPPER pelvis, LATERAL retrocecal location. Diameter: 16 mm Appendicolith: Multiple. Mucosal hyper-enhancement: Moderate. Extraluminal gas: Absent. Periappendiceal collection: Absent. Marked edema/inflammation surrounding the appendix. Vascular/Lymphatic: Mild to moderate iliofemoral  atherosclerosis. No evidence of aortic aneurysm. No pathologic lymphadenopathy. Reproductive: Normal-appearing uterus and ovaries without evidence of adnexal mass. Other: None. Musculoskeletal: BILATERAL L4 pars defects without evidence of spondylolisthesis. Mild degenerative disc disease at L4-5 with circumferential disc bulge. Mild multifactorial spinal stenosis at the L4-5 level. No acute findings. IMPRESSION: 1. Acute appendicitis. No evidence of perforation or abscess. 2. Atelectasis involving the lower lobes. 3. Very small hiatal hernia. 4. BILATERAL L4 pars defects without evidence of spondylolisthesis. Mild multifactorial spinal stenosis at L4-5. I telephoned these critical/emergent results to [facility], [place], in the emergency department at the time of interpretation on 12/06/2018 at 7:12 p.m. Electronically Signed   By: Evangeline Dakin M.D.   On: 12/06/2018 19:13    , Bernita Raisin, DO 12/07/2018, 8:15 AM PGY-1, [place] Intern pager: [phone], text pages welcome

## 2018-12-07 NOTE — Progress Notes (Signed)
New Admission Note:  Arrival Method: Via stretcher from PACU Mental Orientation: Alert & Oriented x4 Telemetry: N/A Assessment: Completed Skin: Refer to flowsheet IV: RIJ Pain: 0/10 Tubes: LLQ JP Drain Safety Measures: Safety Fall Prevention Plan discussed with patient. Admission: Completed [facility] [place] Orientation: Patient has been orientated to the room, unit and the staff.  Orders have been reviewed and are being implemented. Will continue to monitor the patient. Call light has been placed within reach.   Vassie Moselle, RN  Phone Number: [phone]

## 2018-12-07 NOTE — Progress Notes (Signed)
Patient ID: [name], female   DOB: 07-Mar-1966, 53 y.o.   MRN: [id number]    1 Day Post-Op  Subjective: Patient feels better today.  Pain is much better.  Occasionally has some sharp pains, but otherwise no pain currently.  Taking some clears.  No nausea.  Objective: Vital signs in last 24 hours: Temp:  [97.9 F (36.6 C)-99.9 F (37.7 C)] 98.3 F (36.8 C) (01/02 0736) Pulse Rate:  [91-108] 91 (01/02 0736) Resp:  [16-19] 18 (01/02 0736) BP: (129-173)/(64-92) 163/79 (01/02 0736) SpO2:  [92 %-100 %] 94 % (01/02 0736) Last BM Date: (PTa)  Intake/Output from previous day: 01/01 0701 - 01/02 0700 In: 400 [I.V.:400] Out: 75 [Drains:55; Blood:20] Intake/Output this shift: Total I/O In: 520 [P.O.:520] Out: 75 [Drains:75]  PE: Heart: regular Abd: soft, obese, +BS, appropriately tender, incisions c/d/i, JP with serosang output.  Lab Results:  Recent Labs    12/06/18 1545 12/07/18 0507  WBC 21.1* 19.9*  HGB 12.5 12.4  HCT 42.4 40.3  PLT PLATELET CLUMPS NOTED ON SMEAR, COUNT APPEARS ADEQUATE 171   BMET Recent Labs    12/06/18 1808 12/07/18 0507  NA 140 139  K 4.0 4.8  CL 98 97*  CO2 25 24  GLUCOSE 175* 294*  BUN 37* 48*  CREATININE 6.31* 7.18*  CALCIUM 9.0 8.6*   PT/INR No results for input(s): LABPROT, INR in the last 72 hours. CMP     Component Value Date/Time   NA 139 12/07/2018 0507   NA 139 09/12/2013 0736   K 4.8 12/07/2018 0507   K 5.2 (H) 11/07/2013 0802   CL 97 (L) 12/07/2018 0507   CL 111 (H) 09/12/2013 0736   CO2 24 12/07/2018 0507   CO2 21 09/12/2013 0736   GLUCOSE 294 (H) 12/07/2018 0507   GLUCOSE 181 (H) 09/12/2013 0736   BUN 48 (H) 12/07/2018 0507   BUN 35 (H) 09/12/2013 0736   CREATININE 7.18 (H) 12/07/2018 0507   CREATININE 1.55 (H) 09/12/2013 0736   CREATININE 0.94 09/12/2012 1113   CALCIUM 8.6 (L) 12/07/2018 0507   CALCIUM 7.6 (L) 03/31/2016 1101   PROT 7.4 12/06/2018 1808   ALBUMIN 3.5 12/06/2018 1808   AST 15 12/06/2018  1808   ALT 12 12/06/2018 1808   ALKPHOS 70 12/06/2018 1808   BILITOT 0.4 12/06/2018 1808   GFRNONAA 6 (L) 12/07/2018 0507   GFRNONAA 40 (L) 09/12/2013 0736   GFRNONAA 74 09/12/2012 1113   GFRAA 7 (L) 12/07/2018 0507   GFRAA 46 (L) 09/12/2013 0736   GFRAA 85 09/12/2012 1113   Lipase     Component Value Date/Time   LIPASE 29 12/06/2018 1622       Studies/Results: Ct Abdomen Pelvis Wo Contrast  Result Date: 12/06/2018 CLINICAL DATA:  Acute onset of RIGHT LOWER QUADRANT abdominal pain that began yesterday during hemodialysis. Solitary episode of vomiting. EXAM: CT ABDOMEN AND PELVIS WITHOUT CONTRAST TECHNIQUE: Multidetector CT imaging of the abdomen and pelvis was performed following the standard protocol without IV contrast. COMPARISON:  None. FINDINGS: Lower chest: Atelectasis involving the lower lobes. Visualized lung bases otherwise clear. Heart moderately enlarged. Hepatobiliary: Normal unenhanced appearance of the liver. Prominent Riedel's lobe. Gallbladder normal in appearance without calcified gallstones. No biliary ductal dilation. Pancreas: Normal unenhanced appearance. Spleen: Normal unenhanced appearance. Adrenals/Urinary Tract: Normal appearing adrenal glands. No evidence of urinary tract calculi. Within the limits of the unenhanced technique, no focal parenchymal abnormality involving either kidney. No evidence of hydronephrosis involving either kidney. Normal  appearing urinary bladder. Stomach/Bowel: Very small hiatal hernia. Stomach decompressed and otherwise unremarkable. Normal-appearing small bowel. Mobile cecum positioned in the RIGHT UPPER QUADRANT of the abdomen. Moderate stool burden throughout the normal appearing colon. Appendix: Location: RIGHT UPPER pelvis, LATERAL retrocecal location. Diameter: 16 mm Appendicolith: Multiple. Mucosal hyper-enhancement: Moderate. Extraluminal gas: Absent. Periappendiceal collection: Absent. Marked edema/inflammation surrounding the  appendix. Vascular/Lymphatic: Mild to moderate iliofemoral atherosclerosis. No evidence of aortic aneurysm. No pathologic lymphadenopathy. Reproductive: Normal-appearing uterus and ovaries without evidence of adnexal mass. Other: None. Musculoskeletal: BILATERAL L4 pars defects without evidence of spondylolisthesis. Mild degenerative disc disease at L4-5 with circumferential disc bulge. Mild multifactorial spinal stenosis at the L4-5 level. No acute findings. IMPRESSION: 1. Acute appendicitis. No evidence of perforation or abscess. 2. Atelectasis involving the lower lobes. 3. Very small hiatal hernia. 4. BILATERAL L4 pars defects without evidence of spondylolisthesis. Mild multifactorial spinal stenosis at L4-5. I telephoned these critical/emergent results to [facility], [place], in the emergency department at the time of interpretation on 12/06/2018 at 7:12 p.m. Electronically Signed   By: Evangeline Dakin M.D.   On: 12/06/2018 19:13    Anti-infectives: Anti-infectives (From admission, onward)   Start     Dose/Rate Route Frequency Ordered Stop   12/07/18 0600  piperacillin-tazobactam (ZOSYN) IVPB 3.375 g     3.375 g 12.5 mL/hr over 240 Minutes Intravenous Every 12 hours 12/06/18 2001     12/06/18 1930  piperacillin-tazobactam (ZOSYN) IVPB 3.375 g     3.375 g 100 mL/hr over 30 Minutes Intravenous  Once 12/06/18 1921 12/06/18 2022       Assessment/Plan ESRD DM ETOH  HTN HLD  POD 1, s/p lap appy for gangrenous appendicitis -adv to carb mod diet -cont abx therapy while inpatient -cont JP drain.  If stays serosang, likely plan for DC prior to DC home  -up to chair at least 2x/day as she is unable to ambulate -IS -follow WBC, down to 19K today from 21K yesterday  FEN - carb mod VTE - heparin ID - zosyn   LOS: 1 day    Henreitta Cea , [facility] Surgery 12/07/2018, 12:03 PM Pager: [phone]

## 2018-12-07 NOTE — Consult Note (Addendum)
[facility] Renal Consultation Note    Indication for Consultation:  Management of ESRD/hemodialysis; anemia, hypertension/volume and secondary hyperparathyroidism PCP: Dr. Rory Percy  HPI: [name] is a 53 y.o. female with ESRD on hemodialysis MWF at [facility]. PMH significant for DM, HTN, blind in L eye, PVD S/P R BKA, finger amps, ETOH abuse, polysubstance abuse, AOCD, SHPT.   Patient states she developed RLQ pain post HD 12/05/18. Says she wasn't sure she had fever-thought it was a "hot flash", did have nausea and vomiting. She presented to ED 12/06/17. WBC 21.1. CT of abd/pelvis revealed acute appendicitis without evidence of perforation or abscess. She had lap appendectomy 12/06/2018 per Dr. Kieth Brightly.   Currently she is awake, C/O pain, wanting something stronger for pain, but otherwise, NAD. She is taking clear liquids and tolerating well. Has not been OOB yet.   Past Medical History:  Diagnosis Date  . Alcohol dependency ([place])   . Anemia   . Chronic kidney disease   . Diabetes mellitus    type 2  . Diabetes mellitus type 2, uncontrolled ([place])   . Hypertension   . Legally blind in left eye, as defined in [place]   . Necrotizing fasciitis ([place])   . PONV (postoperative nausea and vomiting)    Past Surgical History:  Procedure Laterality Date  . AMPUTATION     right first and left middle finger amputation 2/2 OM 08/2011 by Dr. Amedeo Plenty  . AMPUTATION  01/18/2012   Procedure: AMPUTATION DIGIT;  Surgeon: Tennis Must, MD;  Location: [place];  Service: Orthopedics;  Laterality: Left;  revision amputation left thumb  . AMPUTATION  08/25/2012   Procedure: AMPUTATION BELOW KNEE;  Surgeon: Newt Minion, MD;  Location: [place];  Service: Orthopedics;  Laterality: Right;  Right Below Knee Amputation  . BASCILIC VEIN TRANSPOSITION Left 07/22/2015   Procedure: BASILIC VEIN TRANSPOSITION ;  Surgeon: Elam Dutch, MD;  Location: [place];  Service: Vascular;  Laterality: Left;  . CATARACT EXTRACTION     right   . CESAREAN SECTION  1986  . CESAREAN SECTION  1985  . CESAREAN SECTION  1991  . I&D EXTREMITY  12/24/2011   Procedure: IRRIGATION AND DEBRIDEMENT EXTREMITY;  Surgeon: Tennis Must, MD;  Location: [place];  Service: Orthopedics;  Laterality: Left;  I&Dleft thumb with partial amputation  . I&D EXTREMITY  12/26/2011   Procedure: IRRIGATION AND DEBRIDEMENT EXTREMITY;  Surgeon: Tennis Must, MD;  Location: [place];  Service: Orthopedics;  Laterality: Left;  . I&D EXTREMITY  12/23/2011   Procedure: IRRIGATION AND DEBRIDEMENT EXTREMITY;  Surgeon: Tennis Must, MD;  Location: [place];  Service: Orthopedics;  Laterality: Left;  I&D Left thumb, hand and arm  . I&D left thumb  12/23/2011  . LAPAROSCOPIC APPENDECTOMY N/A 12/06/2018   Procedure: APPENDECTOMY LAPAROSCOPIC;  Surgeon: Kinsinger, Arta Bruce, MD;  Location: [place];  Service: General;  Laterality: N/A;  . TUBAL LIGATION     Family History  Problem Relation Age of Onset  . Diabetes Mother   . Alcohol abuse Mother   . Diabetes Father   . Alzheimer's disease Father   . Hypertension Father   . Kidney failure Sister   . Diabetes Sister   . High blood pressure Sister    Social History:  reports that she has never smoked. She has never used smokeless tobacco. She reports current alcohol use of about 1.0 standard drinks of alcohol per  week. She reports current drug use. Drug: Cocaine. No Known Allergies Prior to Admission medications   Medication Sig Start Date End Date Taking? Authorizing Provider  amLODipine (NORVASC) 10 MG tablet TAKE 1 TABLET BY MOUTH ONCE DAILY 05/11/18   Rory Percy, DO  atorvastatin (LIPITOR) 40 MG tablet TAKE 1 TABLET BY MOUTH ONCE DAILY 05/11/18   Rumball, Alison, DO  AURYXIA 1 GM 210 MG(Fe) tablet TAKE 2 TABLETS BY MOUTH THREE TIMES A DAY WITH MEALS AND 1 TABLET TWICE A DAY WITH SNACKS.   SWALLOW WHOLE, DO NOT CHEW OR CRUSH MEDICATION 10/06/18    [provider]  B Complex-C-Folic Acid (DIALYVITE 758) 0.8 MG TABS Take 1 tablet by mouth daily. 10/09/18   [provider]  Blood Glucose Monitoring Suppl (PRODIGY AUTOCODE BLOOD GLUCOSE) w/Device KIT 1 Device by Does not apply route 3 (three) times daily. 10/26/18   Rory Percy, DO  gabapentin (NEURONTIN) 100 MG capsule TAKE 1 CAPSULE BY MOUTH ONCE DAILY AS NEEDED 11/01/18   Rory Percy, DO  glucose blood test strip Use as instructed 10/26/18   Rory Percy, DO  HUMULIN 70/30 KWIKPEN (70-30) 100 UNIT/ML PEN INJECT 10 UNITS SUBCUTANEOUSLY AT [facility] AND AT SUPPER 11/01/18   Rory Percy, DO  Insulin Pen Needle (EASY TOUCH PEN NEEDLES) 32G X 4 MM MISC 1 each by Does not apply route 3 (three) times daily. 08/10/18   Rory Percy, DO  Lancet Devices (ONE TOUCH DELICA LANCING DEV) MISC Check blood sugars once daily 05/25/18   Mayo, Pete Pelt, MD  losartan (COZAAR) 50 MG tablet Take 1 tablet (50 mg total) by mouth daily. 08/10/18   Rory Percy, DO  metoprolol tartrate (LOPRESSOR) 50 MG tablet TAKE 1 TABLET BY MOUTH TWICE DAILY 08/10/18   Rory Percy, DO  mupirocin cream (BACTROBAN) 2 % Apply 1 application topically 2 (two) times daily. 10/26/17   Mayo, Pete Pelt, MD  nortriptyline (PAMELOR) 25 MG capsule TAKE 1 CAPSULE BY MOUTH AT BEDTIME 10/12/18   Rory Percy, DO  pantoprazole (PROTONIX) 20 MG tablet Take 1 tablet (20 mg total) by mouth daily. 10/26/17   Mayo, Pete Pelt, MD  PRODIGY LANCETS 28G MISC 1 each by Does not apply route 3 (three) times daily. 10/26/18   Rory Percy, DO   Current Facility-Administered Medications  Medication Dose Route Frequency Provider Last Rate Last Dose  . acetaminophen (TYLENOL) tablet 650 mg  650 mg Oral Q6H Meccariello, Bernita Raisin, DO      . amLODipine (NORVASC) tablet 10 mg  10 mg Oral Daily Riccio, Angela C, DO   10 mg at 12/07/18 0949  . atorvastatin (LIPITOR) tablet 40 mg  40 mg Oral Daily Riccio, Angela C, DO   40 mg at  12/07/18 0949  . docusate sodium (COLACE) capsule 100 mg  100 mg Oral Daily Meccariello, Bernita Raisin, DO   100 mg at 12/07/18 0949  . heparin injection 5,000 Units  5,000 Units Subcutaneous Q8H Lucila Maine C, DO   5,000 Units at 12/07/18 0620  . insulin aspart (novoLOG) injection 0-15 Units  0-15 Units Subcutaneous TID WC Caroline More, DO   8 Units at 12/07/18 1201  . insulin glargine (LANTUS) injection 8 Units  8 Units Subcutaneous Daily Caroline More, DO   8 Units at 12/07/18 1201  . losartan (COZAAR) tablet 50 mg  50 mg Oral Daily Riccio, Angela C, DO   50 mg at 12/07/18 0949  . metoprolol tartrate (LOPRESSOR) tablet 25 mg  25 mg Oral  BID Steve Rattler, DO   25 mg at 12/07/18 2919  . nortriptyline (PAMELOR) capsule 25 mg  25 mg Oral QHS Riccio, Angela C, DO      . ondansetron (ZOFRAN) tablet 4 mg  4 mg Oral Q6H PRN Lucila Maine C, DO       Or  . ondansetron (ZOFRAN) injection 4 mg  4 mg Intravenous Q6H PRN Riccio, Angela C, DO      . oxyCODONE (Oxy IR/ROXICODONE) immediate release tablet 2.5 mg  2.5 mg Oral Q4H PRN Meccariello, Bernita Raisin, DO      . pantoprazole (PROTONIX) EC tablet 20 mg  20 mg Oral Daily Riccio, Angela C, DO   20 mg at 12/07/18 0949  . piperacillin-tazobactam (ZOSYN) IVPB 3.375 g  3.375 g Intravenous Q12H Lucila Maine C, DO 12.5 mL/hr at 12/07/18 0622 3.375 g at 12/07/18 0622  . polyethylene glycol (MIRALAX / GLYCOLAX) packet 17 g  17 g Oral Daily Cleophas Dunker, DO   17 g at 12/07/18 1660   Labs: Basic Metabolic Panel: Recent Labs  Lab 12/06/18 1808 12/07/18 0507  NA 140 139  K 4.0 4.8  CL 98 97*  CO2 25 24  GLUCOSE 175* 294*  BUN 37* 48*  CREATININE 6.31* 7.18*  CALCIUM 9.0 8.6*   Liver Function Tests: Recent Labs  Lab 12/06/18 1808  AST 15  ALT 12  ALKPHOS 70  BILITOT 0.4  PROT 7.4  ALBUMIN 3.5   Recent Labs  Lab 12/06/18 1622  LIPASE 29   No results for input(s): AMMONIA in the last 168 hours. CBC: Recent Labs  Lab  12/06/18 1545 12/07/18 0507  WBC 21.1* 19.9*  NEUTROABS 18.7*  --   HGB 12.5 12.4  HCT 42.4 40.3  MCV 76.7* 76.9*  PLT PLATELET CLUMPS NOTED ON SMEAR, COUNT APPEARS ADEQUATE 171   Cardiac Enzymes: No results for input(s): CKTOTAL, CKMB, CKMBINDEX, TROPONINI in the last 168 hours. CBG: Recent Labs  Lab 12/06/18 2234 12/06/18 2354 12/07/18 0737 12/07/18 1128  GLUCAP 192* 278* 294* 272*   Iron Studies: No results for input(s): IRON, TIBC, TRANSFERRIN, FERRITIN in the last 72 hours. Studies/Results: Ct Abdomen Pelvis Wo Contrast  Result Date: 12/06/2018 CLINICAL DATA:  Acute onset of RIGHT LOWER QUADRANT abdominal pain that began yesterday during hemodialysis. Solitary episode of vomiting. EXAM: CT ABDOMEN AND PELVIS WITHOUT CONTRAST TECHNIQUE: Multidetector CT imaging of the abdomen and pelvis was performed following the standard protocol without IV contrast. COMPARISON:  None. FINDINGS: Lower chest: Atelectasis involving the lower lobes. Visualized lung bases otherwise clear. Heart moderately enlarged. Hepatobiliary: Normal unenhanced appearance of the liver. Prominent Riedel's lobe. Gallbladder normal in appearance without calcified gallstones. No biliary ductal dilation. Pancreas: Normal unenhanced appearance. Spleen: Normal unenhanced appearance. Adrenals/Urinary Tract: Normal appearing adrenal glands. No evidence of urinary tract calculi. Within the limits of the unenhanced technique, no focal parenchymal abnormality involving either kidney. No evidence of hydronephrosis involving either kidney. Normal appearing urinary bladder. Stomach/Bowel: Very small hiatal hernia. Stomach decompressed and otherwise unremarkable. Normal-appearing small bowel. Mobile cecum positioned in the RIGHT UPPER QUADRANT of the abdomen. Moderate stool burden throughout the normal appearing colon. Appendix: Location: RIGHT UPPER pelvis, LATERAL retrocecal location. Diameter: 16 mm Appendicolith: Multiple. Mucosal  hyper-enhancement: Moderate. Extraluminal gas: Absent. Periappendiceal collection: Absent. Marked edema/inflammation surrounding the appendix. Vascular/Lymphatic: Mild to moderate iliofemoral atherosclerosis. No evidence of aortic aneurysm. No pathologic lymphadenopathy. Reproductive: Normal-appearing uterus and ovaries without evidence of adnexal mass. Other: None. Musculoskeletal: BILATERAL L4  pars defects without evidence of spondylolisthesis. Mild degenerative disc disease at L4-5 with circumferential disc bulge. Mild multifactorial spinal stenosis at the L4-5 level. No acute findings. IMPRESSION: 1. Acute appendicitis. No evidence of perforation or abscess. 2. Atelectasis involving the lower lobes. 3. Very small hiatal hernia. 4. BILATERAL L4 pars defects without evidence of spondylolisthesis. Mild multifactorial spinal stenosis at L4-5. I telephoned these critical/emergent results to [facility], [place], in the emergency department at the time of interpretation on 12/06/2018 at 7:12 p.m. Electronically Signed   By: Evangeline Dakin M.D.   On: 12/06/2018 19:13    ROS: As per HPI otherwise negative.   Physical Exam: Vitals:   12/06/18 2245 12/06/18 2312 12/07/18 0416 12/07/18 0736  BP: (!) 155/87 129/64 (!) 148/74 (!) 163/79  Pulse: 100 97 95 91  Resp: '19 18 18 18  ' Temp:  98.8 F (37.1 C) 99.6 F (37.6 C) 98.3 F (36.8 C)  TempSrc:  Oral Oral Oral  SpO2: 92% 96% 94% 94%     General: Well developed, well nourished, in no acute distress. Head: Normocephalic, atraumatic, sclera non-icteric, mucus membranes are moist Neck: Supple. JVD not elevated. Lungs: Clear bilaterally to auscultation without wheezes, rales, or rhonchi. Breathing is unlabored. Heart: RRR with S1 S2. No murmurs, rubs, or gallops appreciated. Abdomen: Soft, tender. JP drain LLQ with serosanguinous drainage. BS present M-S:  Strength and tone appear normal for age. Lower extremities: R BKA no stump edema. No LLE. Finger  amps.  Neuro: Alert and oriented X 3. Moves all extremities spontaneously. Psych:  Responds to questions appropriately with a normal affect. Dialysis Access: L AVF + bruit  Dialysis Orders: [place] M[facility] 3 hr 45 min 180NRe 350/Autoflow 1.5 91.5 kg 2.0 K/ 2.0 Ca UFP 4 LUA AVF -Heparin 4500 units IV TIW -Calcitriol 1.0 mcg PO TIW Last labs: HGB 11.5 Tsat 21 Fe 48 Ca 9.4 C Ca 9.4 Phos 5.9 PTH 215 11/26/18/    Assessment/Plan: 1.  Gangrenous Appendicitis S/P Lap. Appendectomy. PO day 1. QHU76.5. On zosyn. Per primary 2.  ESRD -  MWF. HD tomorrow on schedule. No heparin. K+4.8  3.  Hypertension/volume  - No evidence of volume overload by exam. No wt recorded. Left under OP EDW last treatment. Usually minimal gains. UFG 2-2.5 liters. Will probably need to lower EDW.   4.  Anemia  - HGB 12.4. No ESA needed. Follow HGB.  5.  Metabolic bone disease - . Resume binders, VDRA. Ca 8.6. RFP with HD tomorrow.  6.  Nutrition - Albumin 3.5. Renal/Carb mod diet, nepro, renal vits 7.  DM-per primary 8. Blindness L eye 9. Cocaine/ETOH abuse-per primary. No overt signs of withdrawal at present.    H. Owens Shark, NP-C 12/07/2018, 12:21 PM  [employer] [phone]

## 2018-12-08 LAB — RENAL FUNCTION PANEL
Albumin: 2.5 g/dL — ABNORMAL LOW (ref 3.5–5.0)
Anion gap: 19 — ABNORMAL HIGH (ref 5–15)
BUN: 73 mg/dL — ABNORMAL HIGH (ref 6–20)
CO2: 24 mmol/L (ref 22–32)
Calcium: 8.5 mg/dL — ABNORMAL LOW (ref 8.9–10.3)
Chloride: 95 mmol/L — ABNORMAL LOW (ref 98–111)
Creatinine, Ser: 9.41 mg/dL — ABNORMAL HIGH (ref 0.44–1.00)
GFR calc Af Amer: 5 mL/min — ABNORMAL LOW (ref >60)
GFR calc non Af Amer: 4 mL/min — ABNORMAL LOW (ref >60)
Glucose, Bld: 184 mg/dL — ABNORMAL HIGH (ref 70–99)
Phosphorus: 7.2 mg/dL — ABNORMAL HIGH (ref 2.5–4.6)
Potassium: 4.5 mmol/L (ref 3.5–5.1)
Sodium: 138 mmol/L (ref 135–145)

## 2018-12-08 LAB — CBC
HCT: 34.6 % — ABNORMAL LOW (ref 36.0–46.0)
Hemoglobin: 10.5 g/dL — ABNORMAL LOW (ref 12.0–15.0)
MCH: 23.1 pg — ABNORMAL LOW (ref 26.0–34.0)
MCHC: 30.3 g/dL (ref 30.0–36.0)
MCV: 76 fL — ABNORMAL LOW (ref 80.0–100.0)
Platelets: 198 10*3/uL (ref 150–400)
RBC: 4.55 MIL/uL (ref 3.87–5.11)
RDW: 17.4 % — ABNORMAL HIGH (ref 11.5–15.5)
WBC: 18.6 10*3/uL — ABNORMAL HIGH (ref 4.0–10.5)
nRBC: 0 % (ref 0.0–0.2)

## 2018-12-08 LAB — GLUCOSE, CAPILLARY
Glucose-Capillary: 108 mg/dL — ABNORMAL HIGH (ref 70–99)
Glucose-Capillary: 158 mg/dL — ABNORMAL HIGH (ref 70–99)

## 2018-12-08 MED ORDER — SODIUM CHLORIDE 0.9 % IV SOLN: 100.0000 mL | INTRAVENOUS | Status: DC | PRN

## 2018-12-08 MED ORDER — PENTAFLUOROPROP-TETRAFLUOROETH EX AERO: 1.0000 "application " | INHALATION_SPRAY | CUTANEOUS | Status: DC | PRN

## 2018-12-08 MED ORDER — OXYCODONE HCL 5 MG PO TABS
ORAL_TABLET | ORAL | Status: AC
  Administered 2018-12-08: 2.5 mg via ORAL
  Filled 2018-12-08: qty 1

## 2018-12-08 MED ORDER — ACETAMINOPHEN 325 MG PO TABS
ORAL_TABLET | ORAL | Status: AC
  Administered 2018-12-08: 650 mg via ORAL
  Filled 2018-12-08: qty 2

## 2018-12-08 MED ORDER — LIDOCAINE-PRILOCAINE 2.5-2.5 % EX CREA: 1.0000 "application " | TOPICAL_CREAM | CUTANEOUS | Status: DC | PRN

## 2018-12-08 NOTE — Progress Notes (Signed)
[facility] Progress Note  2 Days Post-Op  Subjective: CC: abdominal soreness Patient reports abdomen is sore but overall improving. Has not been up to chair yet. Passing flatus, no BM. Denies nausea. Patient reports she was having some dark tarry appearing stools prior to hospitalization.   Objective: Vital signs in last 24 hours: Temp:  [98.1 F (36.7 C)-99.4 F (37.4 C)] 98.1 F (36.7 C) (01/03 0645) Pulse Rate:  [83-91] 90 (01/03 1100) Resp:  [14-18] 18 (01/03 1100) BP: (99-154)/(34-72) 111/57 (01/03 1100) SpO2:  [87 %-94 %] 92 % (01/03 0645) FiO2 (%):  [2 %] 2 % (01/02 2134) Weight:  [92.6 kg] 92.6 kg (01/03 0645) Last BM Date: (PTa)  Intake/Output from previous day: 01/02 0701 - 01/03 0700 In: 1300 [P.O.:1200; IV Piggyback:100] Out: 125 [Drains:125] Intake/Output this shift: No intake/output data recorded.  PE: Gen:  Alert, NAD, pleasant Card:  Regular rate and rhythm Pulm:  Normal effort, clear to auscultation bilaterally Abd: Soft, appropriately tender, non-distended, bowel sounds present, no HSM, incisions C/D/I, drain with SS fluid and large clot in bulb Skin: warm and dry, no rashes  Psych: A&Ox3   Lab Results:  Recent Labs    12/07/18 0507 12/08/18 0723  WBC 19.9* 18.6*  HGB 12.4 10.5*  HCT 40.3 34.6*  PLT 171 198   BMET Recent Labs    12/07/18 0507 12/08/18 0723  NA 139 138  K 4.8 4.5  CL 97* 95*  CO2 24 24  GLUCOSE 294* 184*  BUN 48* 73*  CREATININE 7.18* 9.41*  CALCIUM 8.6* 8.5*   PT/INR No results for input(s): LABPROT, INR in the last 72 hours. CMP     Component Value Date/Time   NA 138 12/08/2018 0723   NA 139 09/12/2013 0736   K 4.5 12/08/2018 0723   K 5.2 (H) 11/07/2013 0802   CL 95 (L) 12/08/2018 0723   CL 111 (H) 09/12/2013 0736   CO2 24 12/08/2018 0723   CO2 21 09/12/2013 0736   GLUCOSE 184 (H) 12/08/2018 0723   GLUCOSE 181 (H) 09/12/2013 0736   BUN 73 (H) 12/08/2018 0723   BUN 35 (H) 09/12/2013 0736   CREATININE 9.41 (H) 12/08/2018 0723   CREATININE 1.55 (H) 09/12/2013 0736   CREATININE 0.94 09/12/2012 1113   CALCIUM 8.5 (L) 12/08/2018 0723   CALCIUM 7.6 (L) 03/31/2016 1101   PROT 7.4 12/06/2018 1808   ALBUMIN 2.5 (L) 12/08/2018 0723   AST 15 12/06/2018 1808   ALT 12 12/06/2018 1808   ALKPHOS 70 12/06/2018 1808   BILITOT 0.4 12/06/2018 1808   GFRNONAA 4 (L) 12/08/2018 0723   GFRNONAA 40 (L) 09/12/2013 0736   GFRNONAA 74 09/12/2012 1113   GFRAA 5 (L) 12/08/2018 0723   GFRAA 46 (L) 09/12/2013 0736   GFRAA 85 09/12/2012 1113   Lipase     Component Value Date/Time   LIPASE 29 12/06/2018 1622       Studies/Results: Ct Abdomen Pelvis Wo Contrast  Result Date: 12/06/2018 CLINICAL DATA:  Acute onset of RIGHT LOWER QUADRANT abdominal pain that began yesterday during hemodialysis. Solitary episode of vomiting. EXAM: CT ABDOMEN AND PELVIS WITHOUT CONTRAST TECHNIQUE: Multidetector CT imaging of the abdomen and pelvis was performed following the standard protocol without IV contrast. COMPARISON:  None. FINDINGS: Lower chest: Atelectasis involving the lower lobes. Visualized lung bases otherwise clear. Heart moderately enlarged. Hepatobiliary: Normal unenhanced appearance of the liver. Prominent Riedel's lobe. Gallbladder normal in appearance without calcified gallstones. No biliary ductal dilation. Pancreas:  Normal unenhanced appearance. Spleen: Normal unenhanced appearance. Adrenals/Urinary Tract: Normal appearing adrenal glands. No evidence of urinary tract calculi. Within the limits of the unenhanced technique, no focal parenchymal abnormality involving either kidney. No evidence of hydronephrosis involving either kidney. Normal appearing urinary bladder. Stomach/Bowel: Very small hiatal hernia. Stomach decompressed and otherwise unremarkable. Normal-appearing small bowel. Mobile cecum positioned in the RIGHT UPPER QUADRANT of the abdomen. Moderate stool burden throughout the normal appearing  colon. Appendix: Location: RIGHT UPPER pelvis, LATERAL retrocecal location. Diameter: 16 mm Appendicolith: Multiple. Mucosal hyper-enhancement: Moderate. Extraluminal gas: Absent. Periappendiceal collection: Absent. Marked edema/inflammation surrounding the appendix. Vascular/Lymphatic: Mild to moderate iliofemoral atherosclerosis. No evidence of aortic aneurysm. No pathologic lymphadenopathy. Reproductive: Normal-appearing uterus and ovaries without evidence of adnexal mass. Other: None. Musculoskeletal: BILATERAL L4 pars defects without evidence of spondylolisthesis. Mild degenerative disc disease at L4-5 with circumferential disc bulge. Mild multifactorial spinal stenosis at the L4-5 level. No acute findings. IMPRESSION: 1. Acute appendicitis. No evidence of perforation or abscess. 2. Atelectasis involving the lower lobes. 3. Very small hiatal hernia. 4. BILATERAL L4 pars defects without evidence of spondylolisthesis. Mild multifactorial spinal stenosis at L4-5. I telephoned these critical/emergent results to [facility], [place], in the emergency department at the time of interpretation on 12/06/2018 at 7:12 p.m. Electronically Signed   By: Evangeline Dakin M.D.   On: 12/06/2018 19:13    Anti-infectives: Anti-infectives (From admission, onward)   Start     Dose/Rate Route Frequency Ordered Stop   12/07/18 0600  piperacillin-tazobactam (ZOSYN) IVPB 3.375 g     3.375 g 12.5 mL/hr over 240 Minutes Intravenous Every 12 hours 12/06/18 2001     12/06/18 1930  piperacillin-tazobactam (ZOSYN) IVPB 3.375 g     3.375 g 100 mL/hr over 30 Minutes Intravenous  Once 12/06/18 1921 12/06/18 2022       Assessment/Plan ESRD DM ETOH  HTN HLD ?melena - on PPI already, continue to monitor  POD 2, s/p lap appy for gangrenous appendicitis -cont diet -cont abx therapy while inpatient -cont JP drain.  If stays [place]os[place], likely plan for DC prior to DC home -up to chair at least 2x/day as she is unable to  ambulate!!! -IS -follow WBC, down to 18K today from 19K yesterday  FEN - renal/carb mod VTE - heparin ID - zosyn  LOS: 2 days    Brigid Re , [facility] 12/08/2018, 11:11 AM Pager: [phone] Consults: [phone] Mon-Fri 7:00 am-4:30 pm Sat-Sun 7:00 am-11:30 am

## 2018-12-08 NOTE — Progress Notes (Signed)
JP drain removed per protocol.  Pt tolerated well.  Will continue to monitor.

## 2018-12-08 NOTE — Discharge Summary (Signed)
[facility] Discharge Summary  Patient name: [name] Medical record number: [id number] Date of birth: [birth year] Age: 53 y.o. Gender: female Date of Admission: 12/06/2018  Date of Discharge: 12/09/2018 Admitting Physician: Martyn Malay, MD  Primary Care Provider: Rory Percy, DO Consultants: gen surg, nephrology  Indication for Hospitalization: appendicitis  Discharge Diagnoses/Problem List:  Gangrenous appendicitis Melena? ESRD on HD MWF DMII HTN HLD Blindness Constipation  GERD  Disposition: discharge home  Discharge Condition: improved, stable  Discharge Exam:  (copied from progress note on day of discharge) General: well appearing female sitting up in bed in NAD Cardiovascular: RRR, no murmur  Respiratory: CTAB, easy WOB  Abdomen: SNTND, skin glue over umbilicus without drainage, LLQ dressing CDI Extremities: no edema  Brief Hospital Course:  [place] a 53 y.o.femalewho presented with acute appendicitis, now s/p appendectomy 1/1. PMH is significant for ESRD, DM-II, HTN, HLD, BKA, blindness, alcohol dependency. Her hospital course as outlined below.  Gangrenous appendicitis Admission details can be found in H&P.  Patient found to have appendicitis on admission and was taken for appendectomy the same day on 1/1.  During the operation the op note states that feculent material poured from appendix during the procedure.  The patient remained on IV Zosyn following the procedure for 3 days and no antibiotics were recommended by surgery for discharge. She remained afebrile following her surgery and white blood cell count continue to improve.  At the time of discharge her white blood cell count was 13.9.  She also had a JP drain following procedure, which decreased in output throughout her hospitalization and was removed prior to discharge.  At the time of discharge the patient's pain was well controlled, she was afebrile with  stable vital signs.  She was given strict return precautions.  The patient's chronic medical issues were also addressed during her hospitalization without change to her outpatient therapy.  Issues for Follow Up:  1. F/u with gen surg (JP drain removed prior to dc), pathology of appendix pending at time of discharge 2. Dialysis MWF 3. GI for darkened stools and colonoscopy OP 4. Monitor Hgb (11.5 on day of discharge)  Significant Procedures: Appendectomy 1/1  Significant Labs and Imaging:  Recent Labs  Lab 12/07/18 0507 12/08/18 0723 12/09/18 0618  WBC 19.9* 18.6* 13.9*  HGB 12.4 10.5* 11.5*  HCT 40.3 34.6* 38.0  PLT 171 198 225   Recent Labs  Lab 12/06/18 1808 12/07/18 0507 12/08/18 0723 12/09/18 0618  NA 140 139 138 137  K 4.0 4.8 4.5 3.9  CL 98 97* 95* 96*  CO2 _0 GLUCOSE 175* 294* 184* 245*  BUN 37* 48* 73* 42*  CREATININE 6.31* 7.18* 9.41* 7.13*  CALCIUM 9.0 8.6* 8.5* 8.3*  PHOS  --   --  7.2*  --   ALKPHOS 70  --   --   --   AST 15  --   --   --   ALT 12  --   --   --   ALBUMIN 3.5  --  2.5*  --    Urinalysis    Component Value Date/Time   COLORURINE YELLOW 12/06/2018 1730   APPEARANCEUR TURBID (A) 12/06/2018 1730   LABSPEC 1.015 12/06/2018 1730   PHURINE 8.0 12/06/2018 1730   GLUCOSEU >=500 (A) 12/06/2018 1730   HGBUR NEGATIVE 12/06/2018 1730   BILIRUBINUR NEGATIVE 12/06/2018 1730   KETONESUR NEGATIVE 12/06/2018 1730   PROTEINUR 100 (A) 12/06/2018 1730  UROBILINOGEN 0.2 07/13/2015 1508   NITRITE NEGATIVE 12/06/2018 1730   LEUKOCYTESUR SMALL (A) 12/06/2018 1730   Lipase negative  Results/Tests Pending at Time of Discharge: surgical pathology of appendix  Discharge Medications:  Allergies as of 12/09/2018   No Known Allergies     Medication List    TAKE these medications   amLODipine 10 MG tablet Commonly known as:  NORVASC TAKE 1 TABLET BY MOUTH ONCE DAILY   atorvastatin 40 MG tablet Commonly known as:  LIPITOR TAKE 1 TABLET  BY MOUTH ONCE DAILY   AURYXIA 1 GM 210 MG(Fe) tablet Generic drug:  ferric citrate TAKE 2 TABLETS BY MOUTH THREE TIMES A DAY WITH MEALS AND 1 TABLET TWICE A DAY WITH SNACKS.   SWALLOW WHOLE, DO NOT CHEW OR CRUSH MEDICATION   DIALYVITE 800 0.8 MG Tabs Take 1 tablet by mouth daily.   gabapentin 100 MG capsule Commonly known as:  NEURONTIN TAKE 1 CAPSULE BY MOUTH ONCE DAILY AS NEEDED   glucose blood test strip Use as instructed   HUMULIN 70/30 KWIKPEN (70-30) 100 UNIT/ML PEN Generic drug:  Insulin Isophane & Regular Human INJECT 10 UNITS SUBCUTANEOUSLY AT BREAKFAST AND AT SUPPER   Insulin Pen Needle 32G X 4 MM Misc Commonly known as:  EASY TOUCH PEN NEEDLES 1 each by Does not apply route 3 (three) times daily.   losartan 50 MG tablet Commonly known as:  COZAAR Take 1 tablet (50 mg total) by mouth daily.   metoprolol tartrate 50 MG tablet Commonly known as:  LOPRESSOR TAKE 1 TABLET BY MOUTH TWICE DAILY   mupirocin cream 2 % Commonly known as:  BACTROBAN Apply 1 application topically 2 (two) times daily.   nortriptyline 25 MG capsule Commonly known as:  PAMELOR TAKE 1 CAPSULE BY MOUTH AT BEDTIME   ONE TOUCH DELICA LANCING DEV Misc Check blood sugars once daily   oxyCODONE 5 MG immediate release tablet Commonly known as:  Oxy IR/ROXICODONE Take 0.5 tablets (2.5 mg total) by mouth every 4 (four) hours as needed for moderate pain or severe pain.   pantoprazole 20 MG tablet Commonly known as:  PROTONIX Take 1 tablet (20 mg total) by mouth daily.   polyethylene glycol packet Commonly known as:  MIRALAX / GLYCOLAX Take 17 g by mouth daily. Start taking on:  December 10, 2018   Prodigy Autocode Blood Glucose w/Device Kit 1 Device by Does not apply route 3 (three) times daily.   PRODIGY LANCETS 28G Misc 1 each by Does not apply route 3 (three) times daily.       Discharge Instructions: Please refer to Patient Instructions section of EMR for full details.  Patient was  counseled important signs and symptoms that should prompt return to medical care, changes in medications, dietary instructions, activity restrictions, and follow up appointments.   Follow-Up Appointments: Follow-up Information    [facility] Follow up on 12/21/2018.   Specialty:  General Surgery Why:  1:45pm, arrive 1:15pm for paperwork and check in.  Bring your insurance card and photo ID Contact information: [address] [place] [address] [phone]           Richarda Osmond, DO 12/09/2018, 6:45 PM PGY-1, [place]

## 2018-12-08 NOTE — Procedures (Signed)
I was present at this dialysis session. I have reviewed the session itself and made appropriate changes.   2K bath. UF 2.5L. No heparin. BP stable. Tolerating well.  Tol PO overnight. +Flatus.   There were no vitals filed for this visit.  Recent Labs  Lab 12/08/18 0723  NA 138  K 4.5  CL 95*  CO2 24  GLUCOSE 184*  BUN 73*  CREATININE 9.41*  CALCIUM 8.5*  PHOS 7.2*    Recent Labs  Lab 12/06/18 1545 12/07/18 0507  WBC 21.1* 19.9*  NEUTROABS 18.7*  --   HGB 12.5 12.4  HCT 42.4 40.3  MCV 76.7* 76.9*  PLT PLATELET CLUMPS NOTED ON SMEAR, COUNT APPEARS ADEQUATE 171    Scheduled Meds: . acetaminophen  650 mg Oral Q6H  . amLODipine  10 mg Oral Daily  . atorvastatin  40 mg Oral Daily  . calcitRIOL  1 mcg Oral Q M,W,F-HD  . Chlorhexidine Gluconate Cloth  6 each Topical Q0600  . docusate sodium  100 mg Oral Daily  . feeding supplement (NEPRO CARB STEADY)  237 mL Oral BID BM  . ferric citrate  420 mg Oral TID WC  . heparin  5,000 Units Subcutaneous Q8H  . insulin aspart  0-15 Units Subcutaneous TID WC  . insulin glargine  8 Units Subcutaneous Daily  . losartan  50 mg Oral Daily  . metoprolol tartrate  25 mg Oral BID  . multivitamin  1 tablet Oral QHS  . nortriptyline  25 mg Oral QHS  . pantoprazole  20 mg Oral Daily  . polyethylene glycol  17 g Oral Daily   Continuous Infusions: . sodium chloride    . sodium chloride    . piperacillin-tazobactam (ZOSYN)  IV 3.375 g (12/08/18 0545)   PRN Meds:.sodium chloride, sodium chloride, lidocaine-prilocaine, ondansetron **OR** ondansetron (ZOFRAN) IV, oxyCODONE, pentafluoroprop-tetrafluoroeth   Pearson Grippe  MD 12/08/2018, 8:38 AM

## 2018-12-08 NOTE — Discharge Instructions (Signed)
[facility] ° °Please arrive at least 30 min before your appointment to complete your check in paperwork.  If you are unable to arrive 30 min prior to your appointment time we may have to cancel or reschedule you. °LAPAROSCOPIC SURGERY: POST OP INSTRUCTIONS °Always review your discharge instruction sheet given to you by the facility where your surgery was performed. °IF YOU HAVE DISABILITY OR FAMILY LEAVE FORMS, YOU MUST BRING THEM TO THE OFFICE FOR PROCESSING.   °DO NOT GIVE THEM TO YOUR DOCTOR. ° °PAIN CONTROL ° °1. First take acetaminophen (Tylenol) AND/or ibuprofen (Advil) to control your pain after surgery.  Follow directions on package.  Taking acetaminophen (Tylenol) and/or ibuprofen (Advil) regularly after surgery will help to control your pain and lower the amount of prescription pain medication you may need.  You should not take more than 4,000 mg (4 grams) of acetaminophen (Tylenol) in 24 hours.  You should not take ibuprofen (Advil), aleve, motrin, naprosyn or other NSAIDS if you have a history of stomach ulcers or chronic kidney disease.  °2. A prescription for pain medication may be given to you upon discharge.  Take your pain medication as prescribed, if you still have uncontrolled pain after taking acetaminophen (Tylenol) or ibuprofen (Advil). °3. Use ice packs to help control pain. °4. If you need a refill on your pain medication, please contact your pharmacy.  They will contact our office to request authorization. Prescriptions will not be filled after 5pm or on week-ends. ° °HOME MEDICATIONS °5. Take your usually prescribed medications unless otherwise directed. ° °DIET °6. You should follow a light diet the first few days after arrival home.  Be sure to include lots of fluids daily. Avoid fatty, fried foods.  ° °CONSTIPATION °7. It is common to experience some constipation after surgery and if you are taking pain medication.  Increasing fluid intake and taking a stool  softener (such as Colace) will usually help or prevent this problem from occurring.  A mild laxative (Milk of Magnesia or Miralax) should be taken according to package instructions if there are no bowel movements after 48 hours. ° °WOUND/INCISION CARE °8. Most patients will experience some swelling and bruising in the area of the incisions.  Ice packs will help.  Swelling and bruising can take several days to resolve.  °9. Unless discharge instructions indicate otherwise, follow guidelines below  °a. STERI-STRIPS - you may remove your outer bandages 48 hours after surgery, and you may shower at that time.  You have steri-strips (small skin tapes) in place directly over the incision.  These strips should be left on the skin for 7-10 days.   °b. DERMABOND/SKIN GLUE - you may shower in 24 hours.  The glue will flake off over the next 2-3 weeks. °10. Any sutures or staples will be removed at the office during your follow-up visit. ° °ACTIVITIES °11. You may resume regular (light) daily activities beginning the next day--such as daily self-care, walking, climbing stairs--gradually increasing activities as tolerated.  You may have sexual intercourse when it is comfortable.  Refrain from any heavy lifting or straining until approved by your doctor. °a. You may drive when you are no longer taking prescription pain medication, you can comfortably wear a seatbelt, and you can safely maneuver your car and apply brakes. ° °FOLLOW-UP °12. You should see your doctor in the office for a follow-up appointment approximately 2-3 weeks after your surgery.  You should have been given your post-op/follow-up appointment when   your surgery was scheduled.  If you did not receive a post-op/follow-up appointment, make sure that you call for this appointment within a day or two after you arrive home to insure a convenient appointment time. ° ° °WHEN TO CALL YOUR DOCTOR: °1. Fever over 101.0 °2. Inability to urinate °3. Continued bleeding from  incision. °4. Increased pain, redness, or drainage from the incision. °5. Increasing abdominal pain ° °The clinic staff is available to answer your questions during regular business hours.  Please don’t hesitate to call and ask to speak to one of the nurses for clinical concerns.  If you have a medical emergency, go to the nearest emergency room or call 911.  A surgeon from [facility] is always on call at the hospital. °[address], [address], [place], [place]  [address] ? [address] [address], [place], [place]   [address] °[phone] ? [phone] ? FAX [phone] ° °

## 2018-12-08 NOTE — Progress Notes (Addendum)
Family Medicine Teaching Service Daily Progress Note Intern Pager: [phone]  Patient name: [name] Medical record number: [id number] Date of birth: June 07, 1966 Age: 53 y.o. Gender: female  Primary Care Provider: Rory Percy, DO Consultants: Gen Surgery Code Status: Full  Pt Overview and Major Events to Date:  1/1 Admitted to [facility] 1/1 Appendectomy  Assessment and Plan: [name] is a 53 y.o. female presenting with acute appendicitis, now s/p appendectomy 1/1. PMH is significant for ESRD, DM-II, HTN, HLD, BKA, blindness, alcohol dependency.   Gangrenous Appendicitis S/P appendectomy 1/1.  Op note states feculent material poured from appendix during procedure.  Remains afebrile with WBC pending this AM, but was improving yesterday. Receiving sch tylenol, is requiring prns of oxycodone, but mose recently about 7 hours between doses.  Spoke with surgery PA and state that given continued JP drainage and high WBC, will continue inpatient stay, JP drain, and IV abx.  Also stated that may transition to PO abx tomorrow. Tolerated full diet yesterday.   - renal/carb modified diet - appreciate general surgery's excellent care and recommendations - cont zosyn (1/1-1/2) - zofran prn naursa - schedule tylenol 650mg  q6h - oxy IR 2.5mg  q4h prn  ESRD on HD - patient gets HD MWF. Creatinine baseline 6-7.  Cr pending this AM.  In HD today. Takes dialyvite 800, auryxia tablet. - nephro following for HD needs - continue dialyvite 800 - continue auryxia - monitor BMP  DM-II - patient on 10 U  Humulin 70/30 BID. A1c on 11/21 is 7.7.   Started on Lantus 8u QD yesterday.  CBG this AM  108. - increase to moderate SSI - cont Lantus 8q QD while inpatient - CBG w/ meal and qhs.  - holding gabapentin for neuropathy, is prn med, will restart as needed  Alcohol dependency - patient states she has not had a drink of alcohol in over 6 years.  - ciwa scores  - monitor for signs of alcohol  withdrawal.   HTN  - patient on amlodipine 10mg , losartan 50mg  qdaily, and lopressor 50mg  BID at home.  BP this AM 128/68 - cont amlodipine 10mg  QD - cont losartan 50mg  QD - cont metoprolol 25mg  BID, increase as tolerated to home dose  HLD - patient on lipitor 40mg  at home.  Lipid profile in June 2019: 163 total cholesterol, 79 LDL, triglycerides 188.   - restart lipitor 40mg  QD  Blindness - patient can only see shapes out of right eye. - out of bed with assistance only.      Constipation - patient complains of not having bowel movements regularly, takes stool softeners at home - MiraLAX daily -Colace daily  GERD - patient takes protonix 20mg  at home.  - continue protonix    FEN/GI: clear liquids, advance as tolerated PPx: heparin  Disposition: likely home tomorrow pending decreased drainage  Subjective:  Patient states that she is feeling better today.  Continues to have some soreness in her abdomen, but improved from yesterday.  Has been tolerating PO diet.  Has not had BM but has passed gas.  Objective: Temp:  [98.4 F (36.9 C)-99.4 F (37.4 C)] 98.4 F (36.9 C) (01/03 0539) Pulse Rate:  [84-91] 84 (01/03 0539) Resp:  [16-18] 18 (01/03 0539) BP: (115-128)/(57-68) 128/68 (01/03 0539) SpO2:  [87 %-94 %] 93 % (01/03 0539) FiO2 (%):  [2 %] 2 % (01/02 2134)   I/O last 3 completed shifts: In: 1700 [P.O.:1200; I.V.:400; IV Piggyback:100] Out: 200 [Drains:180; Blood:20] No intake/output  data recorded.   Physical Exam: General: 53 y.o. female in NAD, lying in bed in HD Cardio: RRR no m/r/g Lungs: CTAB, no wheezing, no rhonchi, no crackles Abdomen: Soft, mildly TTP lower abdomen, dressing on LLQ without surrounding erythema, some blood on bandage, serosanguinous drainage in drain Skin: warm and dry Extremities: No edema, BKA right    Laboratory: Recent Labs  Lab 12/06/18 1545 12/07/18 0507  WBC 21.1* 19.9*  HGB 12.5 12.4  HCT 42.4 40.3  PLT PLATELET CLUMPS  NOTED ON SMEAR, COUNT APPEARS ADEQUATE 171   Recent Labs  Lab 12/06/18 1808 12/07/18 0507 12/08/18 0723  NA 140 139 138  K 4.0 4.8 4.5  CL 98 97* 95*  CO2 25 24 24   BUN 37* 48* 73*  CREATININE 6.31* 7.18* 9.41*  CALCIUM 9.0 8.6* 8.5*  PROT 7.4  --   --   BILITOT 0.4  --   --   ALKPHOS 70  --   --   ALT 12  --   --   AST 15  --   --   GLUCOSE 175* 294* 184*    Imaging/Diagnostic Tests: No results found.  Meccariello, Bernita Raisin, DO 12/08/2018, 8:27 AM PGY-1, [place] Intern pager: [phone], text pages welcome

## 2018-12-09 DIAGNOSIS — R1031 Right lower quadrant pain: Secondary | ICD-10-CM

## 2018-12-09 LAB — CBC
HCT: 38 % (ref 36.0–46.0)
Hemoglobin: 11.5 g/dL — ABNORMAL LOW (ref 12.0–15.0)
MCH: 23 pg — ABNORMAL LOW (ref 26.0–34.0)
MCHC: 30.3 g/dL (ref 30.0–36.0)
MCV: 76 fL — ABNORMAL LOW (ref 80.0–100.0)
Platelets: 225 10*3/uL (ref 150–400)
RBC: 5 MIL/uL (ref 3.87–5.11)
RDW: 17.8 % — ABNORMAL HIGH (ref 11.5–15.5)
WBC: 13.9 10*3/uL — ABNORMAL HIGH (ref 4.0–10.5)
nRBC: 0.1 % (ref 0.0–0.2)

## 2018-12-09 LAB — BASIC METABOLIC PANEL
Anion gap: 16 — ABNORMAL HIGH (ref 5–15)
BUN: 42 mg/dL — ABNORMAL HIGH (ref 6–20)
CO2: 25 mmol/L (ref 22–32)
Calcium: 8.3 mg/dL — ABNORMAL LOW (ref 8.9–10.3)
Chloride: 96 mmol/L — ABNORMAL LOW (ref 98–111)
Creatinine, Ser: 7.13 mg/dL — ABNORMAL HIGH (ref 0.44–1.00)
GFR calc Af Amer: 7 mL/min — ABNORMAL LOW (ref >60)
GFR calc non Af Amer: 6 mL/min — ABNORMAL LOW (ref >60)
Glucose, Bld: 245 mg/dL — ABNORMAL HIGH (ref 70–99)
Potassium: 3.9 mmol/L (ref 3.5–5.1)
Sodium: 137 mmol/L (ref 135–145)

## 2018-12-09 LAB — GLUCOSE, CAPILLARY
Glucose-Capillary: 236 mg/dL — ABNORMAL HIGH (ref 70–99)
Glucose-Capillary: 234 mg/dL — ABNORMAL HIGH (ref 70–99)
Glucose-Capillary: 183 mg/dL — ABNORMAL HIGH (ref 70–99)

## 2018-12-09 MED ORDER — OXYCODONE HCL 5 MG PO TABS: 2.5000 mg | ORAL_TABLET | ORAL | 0 refills | Status: DC | PRN

## 2018-12-09 MED ORDER — POLYETHYLENE GLYCOL 3350 17 G PO PACK: 17.0000 g | PACK | Freq: Every day | ORAL | 0 refills | Status: DC

## 2018-12-09 NOTE — Progress Notes (Signed)
Family Medicine Teaching Service Daily Progress Note Intern Pager: [phone]  Patient name: ATHIRA JANOWICZ Medical record number: [id number] Date of birth: 1966-04-28 Age: 53 y.o. Gender: female  Primary Care Provider: Rory Percy, DO Consultants: nephro, gen surg Code Status: FULL  Pt Overview and Major Events to Date:  1/1 admitted to [facility], appendectomy   Assessment and Plan: [place] Ladson Collieris a 53 y.o.femalepresenting with acute appendicitis, now s/p appendectomy 1/1. PMH is significant for ESRD, DM-II, HTN, HLD, BKA, blindness, alcohol dependency.  Gangrenous Appendicitis S/P appendectomy 1/1.  Op note states feculent material poured from appendix during procedure.  Remains afebrile with WBC improved this AM, but was improving yesterday. Receiving sch tylenol, is requiring prns of oxycodone. - renal/carb modified diet - appreciate general surgery's excellent care and recommendations - cont zosyn pending surgery recs - zofran prn naursa - schedule tylenol 650mg  q6h - oxy IR 2.5mg  q4h prn  Dark stools - patient thought she was not on iron, but is on [place]. Recommended outpatient workup given chronic nature and Hgb stable.  -needs outpatient colonoscopy  ESRD on HD- patient gets HD MWF.Creatinine baseline 6-7.  Cr pending this AM.  In HD today. Takes dialyvite 800, auryxia tablet. - nephro followingfor HD needs - continue dialyvite 800 - continue auryxia - monitor BMP  DM-II - patient on 10 U Humulin 70/30 BID. A1c on 11/21 is 7.7. Started on Lantus 8u QD yesterday.  CBG this AM  108. -increase to moderate SSI - cont Lantus 8q QD while inpatient - CBG w/ meal and qhs.  - holding gabapentin for neuropathy, is prn med, will restart as needed  Alcohol dependency- patient states she has not had a drink of alcohol in over 6 years.  - ciwa scores  - monitor for signs of alcohol withdrawal.  HTN - patient on amlodipine 10mg , losartan 50mg  qdaily, and  lopressor 50mg  BID at home.  BP this AM 128/68 - cont amlodipine 10mg  QD - cont losartan 50mg  QD - cont metoprolol 25mg  BID, increase as tolerated to home dose  HLD- patient on lipitor 40mg  at home. Lipid profile in June 2019: 163 total cholesterol, 79 LDL, triglycerides 188.  - restart lipitor 40mg  QD  Blindness- patient can only see shapes out of right eye. - out of bed with assistance only.  Constipation- patient complains of not having bowel movements regularly, takes stool softeners at home - MiraLAX daily -Colace daily  GERD - patient takes protonix 20mg  at home.  - continue protonix  FEN/GI: clear liquids, advance as tolerated PPx: SCDs  Disposition: pending surgical clearance  Subjective:  Patient feels well this am, eating breakfast. States she has bad black stools for several months, has never had colonoscopy.   Objective: Temp:  [97.5 F (36.4 C)-99.1 F (37.3 C)] 98.4 F (36.9 C) (01/04 0502) Pulse Rate:  [83-95] 88 (01/04 0502) Resp:  [14-18] 18 (01/04 0502) BP: (99-154)/(34-77) 111/55 (01/04 0502) SpO2:  [92 %-100 %] 100 % (01/04 0502) Weight:  [87.4 kg-92.6 kg] 87.4 kg (01/04 0100) Physical Exam: General: well appearing female sitting up in bed in NAD Cardiovascular: RRR, no murmur  Respiratory: CTAB, easy WOB  Abdomen: SNTND, skin glue over umbilicus without drainage, LLQ dressing CDI Extremities: no edema  Laboratory: Recent Labs  Lab 12/06/18 1545 12/07/18 0507 12/08/18 0723  WBC 21.1* 19.9* 18.6*  HGB 12.5 12.4 10.5*  HCT 42.4 40.3 34.6*  PLT PLATELET CLUMPS NOTED ON SMEAR, COUNT APPEARS ADEQUATE 171 198   Recent Labs  Lab 12/06/18 1808 12/07/18 0507 12/08/18 0723  NA 140 139 138  K 4.0 4.8 4.5  CL 98 97* 95*  CO2 25 24 24   BUN 37* 48* 73*  CREATININE 6.31* 7.18* 9.41*  CALCIUM 9.0 8.6* 8.5*  PROT 7.4  --   --   BILITOT 0.4  --   --   ALKPHOS 70  --   --   ALT 12  --   --   AST 15  --   --   GLUCOSE 175* 294*  184*    Imaging/Diagnostic Tests: No new imaging.   Sela Hilding, MD 12/09/2018, 6:52 AM PGY-3, [place] Intern pager: [phone], text pages welcome

## 2018-12-09 NOTE — Progress Notes (Signed)
3 Days Post-Op    CC: Right lower quadrant pain  Subjective: Doing well postop.  Ready for discharge.  Her drain is out.  Abdomen is soft she is tolerating a diet.  No BM recorded.  Objective: Vital signs in last 24 hours: Temp:  [97.5 F (36.4 C)-99.1 F (37.3 C)] 99.1 F (37.3 C) (01/04 0920) Pulse Rate:  [83-95] 91 (01/04 0920) Resp:  [18] 18 (01/04 0920) BP: (109-154)/(55-77) 126/66 (01/04 0920) SpO2:  [94 %-100 %] 95 % (01/04 0920) Weight:  [87.4 kg-89.4 kg] 87.4 kg (01/04 0100) Last BM Date: 12/06/18 480 p.o. 2673 for HD No BM recorded Afebrile vital signs are stable Creatinine 7.13 BC down to 13.9 Intake/Output from previous day: 01/03 0701 - 01/04 0700 In: 480 [P.O.:480] Out: 2773 [Urine:100] Intake/Output this shift: Total I/O In: 240 [P.O.:240] Out: 0   General appearance: alert, cooperative and no distress GI: Soft, port sites all look fine.  JP drains removed and site is clean and dry.  I left the dressing in place for now.  Lab Results:  Recent Labs    12/08/18 0723 12/09/18 0618  WBC 18.6* 13.9*  HGB 10.5* 11.5*  HCT 34.6* 38.0  PLT 198 225    BMET Recent Labs    12/08/18 0723 12/09/18 0618  NA 138 137  K 4.5 3.9  CL 95* 96*  CO2 24 25  GLUCOSE 184* 245*  BUN 73* 42*  CREATININE 9.41* 7.13*  CALCIUM 8.5* 8.3*   PT/INR No results for input(s): LABPROT, INR in the last 72 hours.  Recent Labs  Lab 12/06/18 1808 12/08/18 0723  AST 15  --   ALT 12  --   ALKPHOS 70  --   BILITOT 0.4  --   PROT 7.4  --   ALBUMIN 3.5 2.5*     Lipase     Component Value Date/Time   LIPASE 29 12/06/2018 1622     Medications: . acetaminophen  650 mg Oral Q6H  . amLODipine  10 mg Oral Daily  . atorvastatin  40 mg Oral Daily  . calcitRIOL  1 mcg Oral Q M,W,F-HD  . Chlorhexidine Gluconate Cloth  6 each Topical Q0600  . docusate sodium  100 mg Oral Daily  . feeding supplement (NEPRO CARB STEADY)  237 mL Oral BID BM  . ferric citrate  420 mg  Oral TID WC  . heparin  5,000 Units Subcutaneous Q8H  . insulin aspart  0-15 Units Subcutaneous TID WC  . insulin glargine  8 Units Subcutaneous Daily  . losartan  50 mg Oral Daily  . metoprolol tartrate  25 mg Oral BID  . multivitamin  1 tablet Oral QHS  . nortriptyline  25 mg Oral QHS  . pantoprazole  20 mg Oral Daily  . polyethylene glycol  17 g Oral Daily   Anti-infectives (From admission, onward)   Start     Dose/Rate Route Frequency Ordered Stop   12/07/18 0600  piperacillin-tazobactam (ZOSYN) IVPB 3.375 g  Status:  Discontinued     3.375 g 12.5 mL/hr over 240 Minutes Intravenous Every 12 hours 12/06/18 2001 12/08/18 1216   12/06/18 1930  piperacillin-tazobactam (ZOSYN) IVPB 3.375 g     3.375 g 100 mL/hr over 30 Minutes Intravenous  Once 12/06/18 1921 12/06/18 2022      Assessment/Plan  ESRD -HD Monday Wednesday Friday DM -: ETOH -none x6 years HTN HLD ?melena - on PPI already, continue to monitor Blind -  Acute gangrenous appendicitis POD  2, s/p lap appy for gangrenous appendicitis 12/06/2018 Dr. Lurena Joiner Kinsinger -[place] abx therapy while inpatient -cont JP drain. If stays [facility]os[facility], likely plan for DC prior to DC home -up to chair at least 2x/day as she is unable to ambulate!!! -IS -follow WBC, 21.9>> 19.9>> 18.6>> 13.9 FEN -renal/carb mod VTE -heparin ID -zosyn 1/1 -12/08/18   Plan: For discharge later today.  She lives with her boyfriend.  She has follow-up in the [facility] for our office.  Her drain is out all her port sites look fine.  She can shower and wash all the sites with plain soap and water.    LOS: 3 days    [name] 12/09/2018 [phone]

## 2018-12-09 NOTE — Progress Notes (Signed)
[facility] Progress Note   Subjective:  Has some soreness at incision site. Eating some. Denies N/V/D Passing flautus. No BM yet.  HD yesterday without issues   Objective Vitals:   12/08/18 2057 12/09/18 0100 12/09/18 0502 12/09/18 0920  BP: 127/77  (!) 111/55 126/66  Pulse: 95  88 91  Resp: 18  18 18   Temp: 99.1 F (37.3 C)  98.4 F (36.9 C) 99.1 F (37.3 C)  TempSrc: Oral  Oral Oral  SpO2: 94%  100% 95%  Weight: 87.4 kg 87.4 kg    Height:       Physical Exam General: WNWD female NAD  Heart: RRR Lungs: CTAB  Abdomen: obese soft; mild tenderness to palpation  LLQ  Extremities: No LE edema  Dialysis Access: LUA AVF +bruit   Dialysis Orders:  [facility] M[facility] 3 hr 45 min 180NRe 350/Autoflow 1.5 91.5 kg 2.0 K/ 2.0 Ca UFP 4 LUA AVF -Heparin 4500 units IV TIW -Calcitriol 1.0 mcg PO TIW  Assessment/Plan: 1. Appendicitis s/p Lap appendectomy 1/1. On Zosyn. WBCs trending down. Per primary  2. ESRD - MWF HD. Continue on schedule. Next HD 1/6.  3. HTN/volume- BP stable. No volume excess on exam. Net UF 2.6L post wt 89.4. Will likely have lower EDW at discharge 4. Anemia - Hgb 11.5. No ESA needs. Follow trends.  5. MBD - Phos elevated. Auryxia binder recently resumed. Follow trends.  6. Nutrition - Low albumin. Renal diet/vitamins/prot supp 7. DM - insulin per primary   Lynnda Child PA-C [place] Pager [phone] 12/09/2018,9:46 AM  LOS: 3 days   Additional Objective Labs: Basic Metabolic Panel: Recent Labs  Lab 12/07/18 0507 12/08/18 0723 12/09/18 0618  NA 139 138 137  K 4.8 4.5 3.9  CL 97* 95* 96*  CO2 24 24 25   GLUCOSE 294* 184* 245*  BUN 48* 73* 42*  CREATININE 7.18* 9.41* 7.13*  CALCIUM 8.6* 8.5* 8.3*  PHOS  --  7.2*  --    CBC: Recent Labs  Lab 12/06/18 1545 12/07/18 0507 12/08/18 0723 12/09/18 0618  WBC 21.1* 19.9* 18.6* 13.9*  NEUTROABS 18.7*  --   --   --   HGB 12.5 12.4 10.5* 11.5*  HCT 42.4 40.3 34.6*  38.0  MCV 76.7* 76.9* 76.0* 76.0*  PLT PLATELET CLUMPS NOTED ON SMEAR, COUNT APPEARS ADEQUATE 171 198 225   Blood Culture    Component Value Date/Time   SDES BLOOD RIGHT HAND 08/20/2012 2315   SPECREQUEST BOTTLES DRAWN AEROBIC AND ANAEROBIC 2CC EA 08/20/2012 2315   CULT NO GROWTH 5 DAYS 08/20/2012 2315   REPTSTATUS 08/27/2012 FINAL 08/20/2012 2315    Cardiac Enzymes: No results for input(s): CKTOTAL, CKMB, CKMBINDEX, TROPONINI in the last 168 hours. CBG: Recent Labs  Lab 12/07/18 2138 12/08/18 1221 12/08/18 1635 12/09/18 0243 12/09/18 0728  GLUCAP 221* 108* 158* 236* 234*   Iron Studies: No results for input(s): IRON, TIBC, TRANSFERRIN, FERRITIN in the last 72 hours. Lab Results  Component Value Date   INR 1.10 08/22/2011   Medications:  . acetaminophen  650 mg Oral Q6H  . amLODipine  10 mg Oral Daily  . atorvastatin  40 mg Oral Daily  . calcitRIOL  1 mcg Oral Q M,W,F-HD  . Chlorhexidine Gluconate Cloth  6 each Topical Q0600  . docusate sodium  100 mg Oral Daily  . feeding supplement (NEPRO CARB STEADY)  237 mL Oral BID BM  . ferric citrate  420 mg Oral TID WC  .  heparin  5,000 Units Subcutaneous Q8H  . insulin aspart  0-15 Units Subcutaneous TID WC  . insulin glargine  8 Units Subcutaneous Daily  . losartan  50 mg Oral Daily  . metoprolol tartrate  25 mg Oral BID  . multivitamin  1 tablet Oral QHS  . nortriptyline  25 mg Oral QHS  . pantoprazole  20 mg Oral Daily  . polyethylene glycol  17 g Oral Daily

## 2018-12-11 DIAGNOSIS — N186 End stage renal disease: Secondary | ICD-10-CM | POA: Diagnosis not present

## 2018-12-11 DIAGNOSIS — D631 Anemia in chronic kidney disease: Secondary | ICD-10-CM | POA: Diagnosis not present

## 2018-12-11 DIAGNOSIS — A419 Sepsis, unspecified organism: Secondary | ICD-10-CM | POA: Diagnosis not present

## 2018-12-11 DIAGNOSIS — E1122 Type 2 diabetes mellitus with diabetic chronic kidney disease: Secondary | ICD-10-CM | POA: Diagnosis not present

## 2018-12-11 DIAGNOSIS — E876 Hypokalemia: Secondary | ICD-10-CM | POA: Diagnosis not present

## 2018-12-11 DIAGNOSIS — N2581 Secondary hyperparathyroidism of renal origin: Secondary | ICD-10-CM | POA: Diagnosis not present

## 2018-12-13 DIAGNOSIS — E876 Hypokalemia: Secondary | ICD-10-CM | POA: Diagnosis not present

## 2018-12-13 DIAGNOSIS — A419 Sepsis, unspecified organism: Secondary | ICD-10-CM | POA: Diagnosis not present

## 2018-12-13 DIAGNOSIS — D631 Anemia in chronic kidney disease: Secondary | ICD-10-CM | POA: Diagnosis not present

## 2018-12-13 DIAGNOSIS — N2581 Secondary hyperparathyroidism of renal origin: Secondary | ICD-10-CM | POA: Diagnosis not present

## 2018-12-13 DIAGNOSIS — N186 End stage renal disease: Secondary | ICD-10-CM | POA: Diagnosis not present

## 2018-12-13 DIAGNOSIS — E1122 Type 2 diabetes mellitus with diabetic chronic kidney disease: Secondary | ICD-10-CM | POA: Diagnosis not present

## 2018-12-15 DIAGNOSIS — N2581 Secondary hyperparathyroidism of renal origin: Secondary | ICD-10-CM | POA: Diagnosis not present

## 2018-12-15 DIAGNOSIS — A419 Sepsis, unspecified organism: Secondary | ICD-10-CM | POA: Diagnosis not present

## 2018-12-15 DIAGNOSIS — E876 Hypokalemia: Secondary | ICD-10-CM | POA: Diagnosis not present

## 2018-12-15 DIAGNOSIS — D631 Anemia in chronic kidney disease: Secondary | ICD-10-CM | POA: Diagnosis not present

## 2018-12-15 DIAGNOSIS — N186 End stage renal disease: Secondary | ICD-10-CM | POA: Diagnosis not present

## 2018-12-15 DIAGNOSIS — E1122 Type 2 diabetes mellitus with diabetic chronic kidney disease: Secondary | ICD-10-CM | POA: Diagnosis not present

## 2018-12-18 DIAGNOSIS — E1122 Type 2 diabetes mellitus with diabetic chronic kidney disease: Secondary | ICD-10-CM | POA: Diagnosis not present

## 2018-12-18 DIAGNOSIS — N2581 Secondary hyperparathyroidism of renal origin: Secondary | ICD-10-CM | POA: Diagnosis not present

## 2018-12-18 DIAGNOSIS — A419 Sepsis, unspecified organism: Secondary | ICD-10-CM | POA: Diagnosis not present

## 2018-12-18 DIAGNOSIS — N186 End stage renal disease: Secondary | ICD-10-CM | POA: Diagnosis not present

## 2018-12-18 DIAGNOSIS — E876 Hypokalemia: Secondary | ICD-10-CM | POA: Diagnosis not present

## 2018-12-18 DIAGNOSIS — D631 Anemia in chronic kidney disease: Secondary | ICD-10-CM | POA: Diagnosis not present

## 2018-12-19 ENCOUNTER — Other Ambulatory Visit: Payer: Self-pay

## 2018-12-19 ENCOUNTER — Emergency Department (HOSPITAL_COMMUNITY): Payer: Medicare Other

## 2018-12-19 ENCOUNTER — Encounter (HOSPITAL_COMMUNITY): Payer: Self-pay

## 2018-12-19 ENCOUNTER — Inpatient Hospital Stay (HOSPITAL_COMMUNITY)
Admission: EM | Admit: 2018-12-19 | Discharge: 2018-12-26 | DRG: 871 | Disposition: A | Discharge status: Home Health | Payer: Medicare Other | Attending: Family Medicine | Admitting: Family Medicine

## 2018-12-19 DIAGNOSIS — K920 Hematemesis: Secondary | ICD-10-CM | POA: Diagnosis not present

## 2018-12-19 DIAGNOSIS — D631 Anemia in chronic kidney disease: Secondary | ICD-10-CM | POA: Diagnosis present

## 2018-12-19 DIAGNOSIS — E1142 Type 2 diabetes mellitus with diabetic polyneuropathy: Secondary | ICD-10-CM | POA: Diagnosis present

## 2018-12-19 DIAGNOSIS — Z79899 Other long term (current) drug therapy: Secondary | ICD-10-CM

## 2018-12-19 DIAGNOSIS — R7881 Bacteremia: Secondary | ICD-10-CM

## 2018-12-19 DIAGNOSIS — R197 Diarrhea, unspecified: Secondary | ICD-10-CM

## 2018-12-19 DIAGNOSIS — Z9049 Acquired absence of other specified parts of digestive tract: Secondary | ICD-10-CM

## 2018-12-19 DIAGNOSIS — B952 Enterococcus as the cause of diseases classified elsewhere: Secondary | ICD-10-CM

## 2018-12-19 DIAGNOSIS — K750 Abscess of liver: Secondary | ICD-10-CM | POA: Diagnosis present

## 2018-12-19 DIAGNOSIS — Z841 Family history of disorders of kidney and ureter: Secondary | ICD-10-CM

## 2018-12-19 DIAGNOSIS — R112 Nausea with vomiting, unspecified: Secondary | ICD-10-CM | POA: Diagnosis not present

## 2018-12-19 DIAGNOSIS — K381 Appendicular concretions: Secondary | ICD-10-CM | POA: Diagnosis present

## 2018-12-19 DIAGNOSIS — Z8249 Family history of ischemic heart disease and other diseases of the circulatory system: Secondary | ICD-10-CM

## 2018-12-19 DIAGNOSIS — R609 Edema, unspecified: Secondary | ICD-10-CM | POA: Diagnosis not present

## 2018-12-19 DIAGNOSIS — Z8719 Personal history of other diseases of the digestive system: Secondary | ICD-10-CM

## 2018-12-19 DIAGNOSIS — Z833 Family history of diabetes mellitus: Secondary | ICD-10-CM

## 2018-12-19 DIAGNOSIS — I120 Hypertensive chronic kidney disease with stage 5 chronic kidney disease or end stage renal disease: Secondary | ICD-10-CM | POA: Diagnosis present

## 2018-12-19 DIAGNOSIS — E785 Hyperlipidemia, unspecified: Secondary | ICD-10-CM | POA: Diagnosis present

## 2018-12-19 DIAGNOSIS — B961 Klebsiella pneumoniae [K. pneumoniae] as the cause of diseases classified elsewhere: Secondary | ICD-10-CM | POA: Diagnosis present

## 2018-12-19 DIAGNOSIS — Z794 Long term (current) use of insulin: Secondary | ICD-10-CM

## 2018-12-19 DIAGNOSIS — Z992 Dependence on renal dialysis: Secondary | ICD-10-CM

## 2018-12-19 DIAGNOSIS — H548 Legal blindness, as defined in USA: Secondary | ICD-10-CM | POA: Diagnosis present

## 2018-12-19 DIAGNOSIS — E1151 Type 2 diabetes mellitus with diabetic peripheral angiopathy without gangrene: Secondary | ICD-10-CM | POA: Diagnosis present

## 2018-12-19 DIAGNOSIS — F1021 Alcohol dependence, in remission: Secondary | ICD-10-CM | POA: Diagnosis present

## 2018-12-19 DIAGNOSIS — K7689 Other specified diseases of liver: Secondary | ICD-10-CM | POA: Diagnosis not present

## 2018-12-19 DIAGNOSIS — I10 Essential (primary) hypertension: Secondary | ICD-10-CM | POA: Diagnosis not present

## 2018-12-19 DIAGNOSIS — N186 End stage renal disease: Secondary | ICD-10-CM | POA: Diagnosis not present

## 2018-12-19 DIAGNOSIS — E440 Moderate protein-calorie malnutrition: Secondary | ICD-10-CM | POA: Diagnosis present

## 2018-12-19 DIAGNOSIS — E876 Hypokalemia: Secondary | ICD-10-CM | POA: Diagnosis not present

## 2018-12-19 DIAGNOSIS — R1110 Vomiting, unspecified: Secondary | ICD-10-CM

## 2018-12-19 DIAGNOSIS — A4159 Other Gram-negative sepsis: Principal | ICD-10-CM | POA: Diagnosis present

## 2018-12-19 DIAGNOSIS — N2581 Secondary hyperparathyroidism of renal origin: Secondary | ICD-10-CM | POA: Diagnosis present

## 2018-12-19 DIAGNOSIS — K219 Gastro-esophageal reflux disease without esophagitis: Secondary | ICD-10-CM | POA: Diagnosis present

## 2018-12-19 DIAGNOSIS — Z89511 Acquired absence of right leg below knee: Secondary | ICD-10-CM

## 2018-12-19 DIAGNOSIS — E1122 Type 2 diabetes mellitus with diabetic chronic kidney disease: Secondary | ICD-10-CM | POA: Diagnosis present

## 2018-12-19 DIAGNOSIS — J9811 Atelectasis: Secondary | ICD-10-CM | POA: Diagnosis not present

## 2018-12-19 DIAGNOSIS — R600 Localized edema: Secondary | ICD-10-CM | POA: Diagnosis not present

## 2018-12-19 DIAGNOSIS — E11319 Type 2 diabetes mellitus with unspecified diabetic retinopathy without macular edema: Secondary | ICD-10-CM | POA: Diagnosis present

## 2018-12-19 DIAGNOSIS — E46 Unspecified protein-calorie malnutrition: Secondary | ICD-10-CM

## 2018-12-19 LAB — COMPREHENSIVE METABOLIC PANEL
ALT: 25 U/L (ref 0–44)
AST: 47 U/L — ABNORMAL HIGH (ref 15–41)
Albumin: 2.8 g/dL — ABNORMAL LOW (ref 3.5–5.0)
Alkaline Phosphatase: 115 U/L (ref 38–126)
Anion gap: 25 — ABNORMAL HIGH (ref 5–15)
BUN: 38 mg/dL — ABNORMAL HIGH (ref 6–20)
CO2: 24 mmol/L (ref 22–32)
Calcium: 8.9 mg/dL (ref 8.9–10.3)
Chloride: 90 mmol/L — ABNORMAL LOW (ref 98–111)
Creatinine, Ser: 8.23 mg/dL — ABNORMAL HIGH (ref 0.44–1.00)
GFR calc Af Amer: 6 mL/min — ABNORMAL LOW (ref >60)
GFR calc non Af Amer: 5 mL/min — ABNORMAL LOW (ref >60)
Glucose, Bld: 199 mg/dL — ABNORMAL HIGH (ref 70–99)
Potassium: 3.8 mmol/L (ref 3.5–5.1)
Sodium: 139 mmol/L (ref 135–145)
Total Bilirubin: 1.3 mg/dL — ABNORMAL HIGH (ref 0.3–1.2)
Total Protein: 8.2 g/dL — ABNORMAL HIGH (ref 6.5–8.1)

## 2018-12-19 LAB — I-STAT BETA HCG BLOOD, ED (MC, WL, AP ONLY)
I-stat hCG, quantitative: <5 m[IU]/mL (ref <5)
I-stat hCG, quantitative: <5 m[IU]/mL (ref <5)

## 2018-12-19 LAB — TYPE AND SCREEN
ABO/RH(D): O NEG
Antibody Screen: NEGATIVE

## 2018-12-19 LAB — CBC
HCT: 35.1 % — ABNORMAL LOW (ref 36.0–46.0)
Hemoglobin: 10.6 g/dL — ABNORMAL LOW (ref 12.0–15.0)
MCH: 22.8 pg — ABNORMAL LOW (ref 26.0–34.0)
MCHC: 30.2 g/dL (ref 30.0–36.0)
MCV: 75.6 fL — ABNORMAL LOW (ref 80.0–100.0)
Platelets: 394 10*3/uL (ref 150–400)
RBC: 4.64 MIL/uL (ref 3.87–5.11)
RDW: 18.1 % — ABNORMAL HIGH (ref 11.5–15.5)
WBC: 23.8 10*3/uL — ABNORMAL HIGH (ref 4.0–10.5)
nRBC: 0 % (ref 0.0–0.2)

## 2018-12-19 LAB — I-STAT TROPONIN, ED: Troponin i, poc: 0.01 ng/mL (ref 0.00–0.08)

## 2018-12-19 LAB — I-STAT CG4 LACTIC ACID, ED: Lactic Acid, Venous: 1.7 mmol/L (ref 0.5–1.9)

## 2018-12-19 LAB — POC OCCULT BLOOD, ED: Fecal Occult Bld: NEGATIVE

## 2018-12-19 LAB — INFLUENZA PANEL BY PCR (TYPE A & B)
Influenza A By PCR: NEGATIVE
Influenza B By PCR: NEGATIVE

## 2018-12-19 LAB — LIPASE, BLOOD: Lipase: 60 U/L — ABNORMAL HIGH (ref 11–51)

## 2018-12-19 MED ORDER — SODIUM CHLORIDE 0.9 % IV BOLUS
250.0000 mL | Freq: Once | INTRAVENOUS | Status: AC
  Administered 2018-12-19: 250 mL via INTRAVENOUS

## 2018-12-19 MED ORDER — VANCOMYCIN HCL 10 G IV SOLR
2000.0000 mg | Freq: Once | INTRAVENOUS | Status: AC
  Administered 2018-12-20: 2000 mg via INTRAVENOUS
  Filled 2018-12-19: qty 2000

## 2018-12-19 MED ORDER — METRONIDAZOLE IN NACL 5-0.79 MG/ML-% IV SOLN
500.0000 mg | Freq: Three times a day (TID) | INTRAVENOUS | Status: DC
  Administered 2018-12-20 – 2018-12-24 (×13): 500 mg via INTRAVENOUS
  Filled 2018-12-19 (×13): qty 100

## 2018-12-19 MED ORDER — SODIUM CHLORIDE 0.9 % IV SOLN
2.0000 g | Freq: Once | INTRAVENOUS | Status: AC
  Administered 2018-12-20: 2 g via INTRAVENOUS
  Filled 2018-12-19: qty 2

## 2018-12-19 MED ORDER — ACETAMINOPHEN 325 MG PO TABS
325.0000 mg | ORAL_TABLET | Freq: Once | ORAL | Status: AC
  Administered 2018-12-19: 325 mg via ORAL
  Filled 2018-12-19: qty 1

## 2018-12-19 MED ORDER — METOCLOPRAMIDE HCL 5 MG/ML IJ SOLN
5.0000 mg | Freq: Once | INTRAMUSCULAR | Status: AC
  Administered 2018-12-19: 5 mg via INTRAVENOUS
  Filled 2018-12-19: qty 2

## 2018-12-19 MED ORDER — PANTOPRAZOLE SODIUM 40 MG IV SOLR
40.0000 mg | Freq: Once | INTRAVENOUS | Status: AC
  Administered 2018-12-19: 40 mg via INTRAVENOUS
  Filled 2018-12-19: qty 40

## 2018-12-19 MED ORDER — ACETAMINOPHEN 325 MG PO TABS
650.0000 mg | ORAL_TABLET | Freq: Once | ORAL | Status: AC | PRN
  Administered 2018-12-19: 650 mg via ORAL
  Filled 2018-12-19: qty 2

## 2018-12-19 MED ORDER — IOHEXOL 300 MG/ML  SOLN
100.0000 mL | Freq: Once | INTRAMUSCULAR | Status: AC | PRN
  Administered 2018-12-19: 100 mL via INTRAVENOUS

## 2018-12-19 NOTE — ED Notes (Signed)
Pt placed on the 5 lead. Pt is also requesting some thing nausea and water. RN [employer] informed

## 2018-12-19 NOTE — ED Triage Notes (Signed)
Having N/V/D and generalized body aches for the last day.  Hx of HTN and dialysis.  BP for EMS 200/90, has not had meds for 2 days. A&Ox4.

## 2018-12-19 NOTE — ED Provider Notes (Addendum)
[facility] EMERGENCY DEPARTMENT Provider Note   CSN: [id number] Arrival date & time: 12/19/18  [facility]     History   Chief Complaint Chief Complaint  Patient presents with  . Nausea  . Emesis  . Diarrhea    HPI [name] is a 53 y.o. female.  HPI  Patient is a 53 year old female with history of type 2 diabetes mellitus, ESRD on dialysis x3 years, alcohol dependency 10 years ago presenting for nausea, vomiting and diarrhea.  She reports symptoms have been present for 2 days.  She reports greater than 10 episodes of vomiting.  She reports that her boyfriend noted that she had bloody emesis today.  This was one time.  She reports that her stools are always dark and tarry due to taking iron.  She denies any abdominal pain.  She reports chills but no fever.  Patient recently had an appendectomy 2 weeks ago, and she reports largely uneventful postoperative course.  Patient reports she has not had any alcohol in 10 years.  Patient reports that she has slight opening of the abdominal wound on the skin level that she noted today, but no erythema or pain from the surgical site. No remedies prior to arrival.   Past Medical History:  Diagnosis Date  . Alcohol dependency ([place])   . Anemia   . Chronic kidney disease   . Diabetes mellitus    type 2  . Diabetes mellitus type 2, uncontrolled ([place])   . Hypertension   . Legally blind in left eye, as defined in [place]   . Necrotizing fasciitis ([place])   . PONV (postoperative nausea and vomiting)     Patient Active Problem List   Diagnosis Date Noted  . Acute right lower quadrant pain   . Appendicitis 12/06/2018  . Chronic cough 10/26/2017  . Healthcare maintenance 05/24/2017  . Diabetic peripheral neuropathy ([place]) 03/17/2017  . Hyperlipidemia 03/17/2017  . S/P BKA (below knee amputation) unilateral, right ([place]) 01/04/2017  . ESRD on dialysis ([place]) 01/04/2017  . History of alcohol abuse 01/04/2017  . Legally blind  01/04/2017  . Type 2 diabetes mellitus ([place]) 09/01/2011  . Hypertension 09/01/2011  . Anemia     Past Surgical History:  Procedure Laterality Date  . AMPUTATION     right first and left middle finger amputation 2/2 OM 08/2011 by Dr. Amedeo Plenty  . AMPUTATION  01/18/2012   Procedure: AMPUTATION DIGIT;  Surgeon: Tennis Must, MD;  Location: [facility];  Service: Orthopedics;  Laterality: Left;  revision amputation left thumb  . AMPUTATION  08/25/2012   Procedure: AMPUTATION BELOW KNEE;  Surgeon: Newt Minion, MD;  Location: [facility];  Service: Orthopedics;  Laterality: Right;  Right Below Knee Amputation  . BASCILIC VEIN TRANSPOSITION Left 07/22/2015   Procedure: BASILIC VEIN TRANSPOSITION ;  Surgeon: Elam Dutch, MD;  Location: [facility];  Service: Vascular;  Laterality: Left;  . CATARACT EXTRACTION     right   . CESAREAN SECTION  1986  . CESAREAN SECTION  1985  . CESAREAN SECTION  1991  . I&D EXTREMITY  12/24/2011   Procedure: IRRIGATION AND DEBRIDEMENT EXTREMITY;  Surgeon: Tennis Must, MD;  Location: [facility];  Service: Orthopedics;  Laterality: Left;  I&Dleft thumb with partial amputation  . I&D EXTREMITY  12/26/2011   Procedure: IRRIGATION AND DEBRIDEMENT EXTREMITY;  Surgeon: Tennis Must, MD;  Location: [facility];  Service: Orthopedics;  Laterality: Left;  . I&D EXTREMITY  12/23/2011  Procedure: IRRIGATION AND DEBRIDEMENT EXTREMITY;  Surgeon: Tennis Must, MD;  Location: [facility];  Service: Orthopedics;  Laterality: Left;  I&D Left thumb, hand and arm  . I&D left thumb  12/23/2011  . LAPAROSCOPIC APPENDECTOMY N/A 12/06/2018   Procedure: APPENDECTOMY LAPAROSCOPIC;  Surgeon: Kinsinger, Arta Bruce, MD;  Location: [facility];  Service: General;  Laterality: N/A;  . TUBAL LIGATION       OB History   No obstetric history on file.      Home Medications    Prior to Admission medications   Medication Sig Start Date End Date Taking? Authorizing Provider  amLODipine (NORVASC) 10 MG tablet  TAKE 1 TABLET BY MOUTH ONCE DAILY 05/11/18   Rory Percy, DO  atorvastatin (LIPITOR) 40 MG tablet TAKE 1 TABLET BY MOUTH ONCE DAILY 05/11/18   Rumball, Alison, DO  AURYXIA 1 GM 210 MG(Fe) tablet TAKE 2 TABLETS BY MOUTH THREE TIMES A DAY WITH MEALS AND 1 TABLET TWICE A DAY WITH SNACKS.   SWALLOW WHOLE, DO NOT CHEW OR CRUSH MEDICATION 10/06/18   [provider]  B Complex-C-Folic Acid (DIALYVITE 536) 0.8 MG TABS Take 1 tablet by mouth daily. 10/09/18   [provider]  Blood Glucose Monitoring Suppl (PRODIGY AUTOCODE BLOOD GLUCOSE) w/Device KIT 1 Device by Does not apply route 3 (three) times daily. 10/26/18   Rory Percy, DO  gabapentin (NEURONTIN) 100 MG capsule TAKE 1 CAPSULE BY MOUTH ONCE DAILY AS NEEDED 11/01/18   Rory Percy, DO  glucose blood test strip Use as instructed 10/26/18   Rory Percy, DO  HUMULIN 70/30 KWIKPEN (70-30) 100 UNIT/ML PEN INJECT 10 UNITS SUBCUTANEOUSLY AT [facility] AND AT SUPPER 11/01/18   Rory Percy, DO  Insulin Pen Needle (EASY TOUCH PEN NEEDLES) 32G X 4 MM MISC 1 each by Does not apply route 3 (three) times daily. 08/10/18   Rory Percy, DO  Lancet Devices (ONE TOUCH DELICA LANCING DEV) MISC Check blood sugars once daily 05/25/18   Mayo, Pete Pelt, MD  losartan (COZAAR) 50 MG tablet Take 1 tablet (50 mg total) by mouth daily. 08/10/18   Rory Percy, DO  metoprolol tartrate (LOPRESSOR) 50 MG tablet TAKE 1 TABLET BY MOUTH TWICE DAILY 08/10/18   Rory Percy, DO  mupirocin cream (BACTROBAN) 2 % Apply 1 application topically 2 (two) times daily. 10/26/17   Mayo, Pete Pelt, MD  nortriptyline (PAMELOR) 25 MG capsule TAKE 1 CAPSULE BY MOUTH AT BEDTIME 10/12/18   Rory Percy, DO  oxyCODONE (OXY IR/ROXICODONE) 5 MG immediate release tablet Take 0.5 tablets (2.5 mg total) by mouth every 4 (four) hours as needed for moderate pain or severe pain. 12/09/18   [place] Bing, DO  pantoprazole (PROTONIX) 20 MG tablet Take 1 tablet (20 mg total) by  mouth daily. 10/26/17   Mayo, Pete Pelt, MD  polyethylene glycol [facility] / Floria Raveling) packet Take 17 g by mouth daily. 12/10/18   Willow Bing, DO  PRODIGY LANCETS 28G MISC 1 each by Does not apply route 3 (three) times daily. 10/26/18   Rory Percy, DO    Family History Family History  Problem Relation Age of Onset  . Diabetes Mother   . Alcohol abuse Mother   . Diabetes Father   . Alzheimer's disease Father   . Hypertension Father   . Kidney failure Sister   . Diabetes Sister   . High blood pressure Sister     Social History Social History   Tobacco Use  . Smoking status:  Never Smoker  . Smokeless tobacco: Never Used  Substance Use Topics  . Alcohol use: Yes    Alcohol/week: 1.0 standard drinks    Types: 1 Cans of beer per week    Comment: h/o alcohol abuse 4 years ago (07/21/15)  . Drug use: Yes    Types: Cocaine    Comment:  last used cocaine 4 years ago (07/21/15)     Allergies   Patient has no known allergies.   Review of Systems Review of Systems  Constitutional: Negative for chills and fever.  HENT: Negative for congestion, rhinorrhea, sinus pain and sore throat.   Eyes: Negative for visual disturbance.  Respiratory: Negative for cough, chest tightness and shortness of breath.   Cardiovascular: Negative for chest pain, palpitations and leg swelling.  Gastrointestinal: Positive for diarrhea, nausea and vomiting. Negative for abdominal pain and constipation.  Genitourinary: Negative for dysuria and flank pain.  Musculoskeletal: Negative for back pain and myalgias.  Skin: Negative for rash.  Neurological: Negative for dizziness and syncope.     Physical Exam Updated Vital Signs BP (!) 173/73   Pulse (!) 116   Temp (!) 102 F (38.9 C) (Oral)   Resp (!) 22   SpO2 93%   Physical Exam Vitals signs and nursing note reviewed.  Constitutional:      General: She is not in acute distress.    Appearance: She is well-developed.  HENT:     Head:  Normocephalic and atraumatic.  Eyes:     Conjunctiva/sclera: Conjunctivae normal.     Pupils: Pupils are equal, round, and reactive to light.  Neck:     Musculoskeletal: Normal range of motion and neck supple.  Cardiovascular:     Rate and Rhythm: Regular rhythm. Tachycardia present.     Heart sounds: S1 normal and S2 normal. No murmur.  Pulmonary:     Effort: Pulmonary effort is normal.     Breath sounds: Normal breath sounds. No wheezing or rales.  Abdominal:     General: Bowel sounds are normal. There is no distension.     Palpations: Abdomen is soft.     Tenderness: There is no abdominal tenderness. There is no guarding or rebound.  Genitourinary:    Comments: Rectal examination performed with RN chaperone present.  Patient has normal rectal tone.  Patient has dark, black stools. Musculoskeletal: Normal range of motion.        General: No deformity.     Comments: S/p RLE BKA.   Lymphadenopathy:     Cervical: No cervical adenopathy.  Skin:    General: Skin is warm and dry.     Findings: No erythema or rash.  Neurological:     Mental Status: She is alert.     Comments: Cranial nerves grossly intact. Patient moves extremities symmetrically and with good coordination.  Psychiatric:        Behavior: Behavior normal.        Thought Content: Thought content normal.        Judgment: Judgment normal.      ED Treatments / Results  Labs (all labs ordered are listed, but only abnormal results are displayed) Labs Reviewed  LIPASE, BLOOD - Abnormal; Notable for the following components:      Result Value   Lipase 60 (*)    All other components within normal limits  COMPREHENSIVE METABOLIC PANEL - Abnormal; Notable for the following components:   Chloride 90 (*)    Glucose, Bld 199 (*)  BUN 38 (*)    Creatinine, Ser 8.23 (*)    Total Protein 8.2 (*)    Albumin 2.8 (*)    AST 47 (*)    Total Bilirubin 1.3 (*)    GFR calc non Af Amer 5 (*)    GFR calc Af Amer 6 (*)     Anion gap 25 (*)    All other components within normal limits  CBC - Abnormal; Notable for the following components:   WBC 23.8 (*)    Hemoglobin 10.6 (*)    HCT 35.1 (*)    MCV 75.6 (*)    MCH 22.8 (*)    RDW 18.1 (*)    All other components within normal limits  PROTIME-INR - Abnormal; Notable for the following components:   Prothrombin Time 15.3 (*)    All other components within normal limits  CULTURE, BLOOD (ROUTINE X 2)  CULTURE, BLOOD (ROUTINE X 2)  INFLUENZA PANEL BY PCR (TYPE A & B)  URINALYSIS, ROUTINE W REFLEX MICROSCOPIC  I-STAT BETA HCG BLOOD, ED ([facility], [facility], AP ONLY)  POC OCCULT BLOOD, ED  I-STAT CG4 LACTIC ACID, ED  I-STAT TROPONIN, ED  I-STAT BETA HCG BLOOD, ED ([facility], [facility], [facility] ONLY)  I-STAT CG4 LACTIC ACID, ED  TYPE AND SCREEN    EKG EKG Interpretation  Date/Time:  Tuesday December 19 2018 21:14:35 EST Ventricular Rate:  118 PR Interval:    QRS Duration: 74 QT Interval:  336 QTC Calculation: 471 R Axis:   68 Text Interpretation:  Sinus tachycardia No significant change since last tracing Confirmed by Deno Etienne [phone]) on 12/19/2018 9:44:48 PM   Radiology Dg Chest 2 View  Result Date: 12/19/2018 CLINICAL DATA:  Nausea, vomiting, and diarrhea. Generalized body aches over the last day. History of hypertension and dialysis. EXAM: CHEST - 2 VIEW COMPARISON:  11/16/2017 FINDINGS: Mild cardiac enlargement. Linear atelectasis in the lung bases. No airspace disease or consolidation in the lungs. No blunting of costophrenic angles. No pneumothorax. Mediastinal contours appear intact. Degenerative changes in the shoulders. IMPRESSION: Mild cardiac enlargement. Linear atelectasis in the lung bases. No evidence of active pulmonary disease. Electronically Signed   By: Lucienne Capers M.D.   On: 12/19/2018 21:42   Ct Abdomen Pelvis W Contrast  Result Date: 12/19/2018 CLINICAL DATA:  Nausea, vomiting, and diarrhea. Generalized body aches for 1 day. Dialysis for 3 years.  Appendectomy on 12/06/2018. EXAM: CT ABDOMEN AND PELVIS WITH CONTRAST TECHNIQUE: Multidetector CT imaging of the abdomen and pelvis was performed using the standard protocol following bolus administration of intravenous contrast. CONTRAST:  128m OMNIPAQUE IOHEXOL 300 MG/ML  SOLN COMPARISON:  12/06/2018 FINDINGS: Lower chest: Linear atelectasis in the lung bases. Hepatobiliary: Lobulated circumscribed cystic lesions demonstrated in the inferior liver, new since previous study, suggesting hepatic abscess. Largest lesion measures about 3.7 cm diameter. Gallbladder and bile ducts are unremarkable. Pancreas: Unremarkable. No pancreatic ductal dilatation or surrounding inflammatory changes. Spleen: Normal in size without focal abnormality. Adrenals/Urinary Tract: Adrenal glands are unremarkable. Kidneys are normal, without renal calculi, focal lesion, or hydronephrosis. Bladder is unremarkable. Stomach/Bowel: Stomach, small bowel, and colon are mostly decompressed, limiting evaluation of the wall. In the right lower quadrant, there is evidence of infiltration in the right lower quadrant fat with some wall thickening suggested in the cecum and ascending colon. Minimal soft tissue edema without definite loculation. This may represent postoperative change or residual inflammatory changes from previous appendicitis. Recurrent inflammatory process could also be present. Vascular/Lymphatic: No significant vascular  findings are present. No enlarged abdominal or pelvic lymph nodes. There a few scattered prominent lymph nodes in the right lower quadrant, likely reactive. Reproductive: Uterus and bilateral adnexa are unremarkable. Other: No free air in the abdomen. Peritoneal nodule along the right lower quadrant anteriorly measuring 1.4 cm diameter, not present previously. This could represent postoperative changes a suture granuloma or postoperative inflammatory reaction. Musculoskeletal: No acute or significant osseous  findings. IMPRESSION: 1. Multiloculated cystic lesions demonstrated in the inferior liver, new since previous study, suggesting hepatic abscess. 2. Postoperative and/or inflammatory stranding and edema throughout the right lower quadrant fat with associated colonic wall thickening in the cecum and ascending colon. 3. Peritoneal nodule along the right lower quadrant anteriorly may represent postoperative changes or postoperative inflammatory reaction. Electronically Signed   By: Lucienne Capers M.D.   On: 12/19/2018 22:44    Procedures Procedures (including critical care time)  CRITICAL CARE Performed by: Albesa Seen   Total critical care time: 35 minutes  Critical care time was exclusive of separately billable procedures and treating other patients.  Critical care was necessary to treat or prevent imminent or life-threatening deterioration.  Critical care was time spent personally by me on the following activities: development of treatment plan with patient and/or surrogate as well as nursing, discussions with consultants, evaluation of patient's response to treatment, examination of patient, obtaining history from patient or surrogate, ordering and performing treatments and interventions, ordering and review of laboratory studies, ordering and review of radiographic studies, pulse oximetry and re-evaluation of patient's condition.   Medications Ordered in ED Medications  sodium chloride 0.9 % bolus 250 mL (250 mLs Intravenous New Bag/Given 12/19/18 2206)  acetaminophen (TYLENOL) tablet 325 mg (has no administration in time range)  pantoprazole (PROTONIX) injection 40 mg (has no administration in time range)  acetaminophen (TYLENOL) tablet 650 mg (650 mg Oral Given 12/19/18 1932)  metoCLOPramide (REGLAN) injection 5 mg (5 mg Intravenous Given 12/19/18 2201)     Initial Impression / Assessment and Plan / ED Course  I have reviewed the triage vital signs and the nursing  notes.  Pertinent labs & imaging results that were available during my care of the patient were reviewed by me and considered in my medical decision making (see chart for details).  Clinical Course as of Dec 20 206  Tue Dec 19, 2018  2206 Lactic Acid, Venous: 1.70 [AM]  2233 Suspect that tarry stools due to iron.   POC occult blood, ED Provider will collect [AM]  Wed Dec 20, 2018  0027 Spoke with family medicine residency service who will see patient.  Appreciate their involvement in the care of this patient.   [AM]    Clinical Course User Index [AM] Albesa Seen, PA-C    Patient is critically ill and requiring a higher level of care. Sepsis suspected. Code sepsis called. Patient meeting SIRS criteria based on fever, tachycardia, leukocytosis. See vitals below. Suspected source of infection intra-abdominal based on symptoms. Lactic acid normal. Signs of end organ dysfunction include lactic acidosis.  Vitals:   12/19/18 2025 12/19/18 2100 12/19/18 2131 12/19/18 2301  BP:  (!) 173/73  (!) 117/97  Pulse: (!) 119 (!) 116  (!) 117  Resp:  (!) 22  (!) 26  Temp:   (!) 102 F (38.9 C)   TempSrc:   Oral   SpO2: 95% 93%  91%     Broad spectrum antibiotics initiated based on confirm source of infection of liver abscess.  Patient  also has incidental finding of inflammatory changes around the cecum, possibly postsurgical.  Spoke with pharmacist who recommended vancomycin, cefepime, and Flagyl.  Patient given 250 mL of normal saline, reassessed with no evidence of volume overload, then additional 250 mL normal saline.  Patient also had an episode of coffee-ground emesis in emergency department.  Patient given Protonix IV.  Patient fecal occult blood negative.  Dr. Brantley Stage consulted on patient, recommended medical admission, and patient to have percutaneous drainage.  General surgery to consult IR.  Appreciate their involvement.  Sepsis - Repeat Assessment  Performed at:    11:51  PM  Vitals     Blood pressure (!) 117/97, pulse (!) 117, temperature (!) 102 F (38.9 C), temperature source Oral, resp. rate (!) 26, SpO2 91 %.  Heart:     Regular rate and rhythm and Tachycardic  Lungs:    CTA  Capillary Refill:   <2 sec  Peripheral Pulse:   Dorsalis pedis pulse  palpable  Skin:     Normal Color    Spoke with Family Medicine resident Dr. Sandi Carne who has decided to admit patient to a high level of care.  Appreciate this team's involvement.   This is a shared visit with Dr. Deno Etienne. Patient was independently evaluated by this attending physician. Attending physician consulted in evaluation and admissoin management.  Final Clinical Impressions(s) / ED Diagnoses   Final diagnoses:  Liver abscess  ESRD (end stage renal disease) ([place])  Non-intractable vomiting with nausea, unspecified vomiting type  Diarrhea, unspecified type  Coffee ground emesis    ED Discharge Orders    None       Albesa Seen, PA-C 12/20/18 0210    Deno Etienne, DO 12/21/18 [place], Louise, PA-C 12/29/18 Babcock, Metcalf, DO 12/29/18 1325

## 2018-12-19 NOTE — ED Notes (Signed)
Pt had episode of dark, tarry diarrhea. Bleeding from appendicitis wound. EDP aware.

## 2018-12-20 DIAGNOSIS — Z9049 Acquired absence of other specified parts of digestive tract: Secondary | ICD-10-CM | POA: Diagnosis not present

## 2018-12-20 DIAGNOSIS — A419 Sepsis, unspecified organism: Secondary | ICD-10-CM

## 2018-12-20 DIAGNOSIS — E785 Hyperlipidemia, unspecified: Secondary | ICD-10-CM | POA: Diagnosis present

## 2018-12-20 DIAGNOSIS — N2581 Secondary hyperparathyroidism of renal origin: Secondary | ICD-10-CM | POA: Diagnosis not present

## 2018-12-20 DIAGNOSIS — K920 Hematemesis: Secondary | ICD-10-CM

## 2018-12-20 DIAGNOSIS — E0822 Diabetes mellitus due to underlying condition with diabetic chronic kidney disease: Secondary | ICD-10-CM | POA: Diagnosis not present

## 2018-12-20 DIAGNOSIS — K750 Abscess of liver: Secondary | ICD-10-CM | POA: Diagnosis present

## 2018-12-20 DIAGNOSIS — E440 Moderate protein-calorie malnutrition: Secondary | ICD-10-CM | POA: Diagnosis not present

## 2018-12-20 DIAGNOSIS — R112 Nausea with vomiting, unspecified: Secondary | ICD-10-CM | POA: Diagnosis not present

## 2018-12-20 DIAGNOSIS — Z8719 Personal history of other diseases of the digestive system: Secondary | ICD-10-CM | POA: Diagnosis not present

## 2018-12-20 DIAGNOSIS — N186 End stage renal disease: Secondary | ICD-10-CM

## 2018-12-20 DIAGNOSIS — E11319 Type 2 diabetes mellitus with unspecified diabetic retinopathy without macular edema: Secondary | ICD-10-CM | POA: Diagnosis present

## 2018-12-20 DIAGNOSIS — R197 Diarrhea, unspecified: Secondary | ICD-10-CM

## 2018-12-20 DIAGNOSIS — E1151 Type 2 diabetes mellitus with diabetic peripheral angiopathy without gangrene: Secondary | ICD-10-CM | POA: Diagnosis present

## 2018-12-20 DIAGNOSIS — R7881 Bacteremia: Secondary | ICD-10-CM | POA: Diagnosis not present

## 2018-12-20 DIAGNOSIS — D631 Anemia in chronic kidney disease: Secondary | ICD-10-CM | POA: Diagnosis not present

## 2018-12-20 DIAGNOSIS — B961 Klebsiella pneumoniae [K. pneumoniae] as the cause of diseases classified elsewhere: Secondary | ICD-10-CM | POA: Diagnosis present

## 2018-12-20 DIAGNOSIS — E1142 Type 2 diabetes mellitus with diabetic polyneuropathy: Secondary | ICD-10-CM | POA: Diagnosis present

## 2018-12-20 DIAGNOSIS — Z992 Dependence on renal dialysis: Secondary | ICD-10-CM

## 2018-12-20 DIAGNOSIS — Z89511 Acquired absence of right leg below knee: Secondary | ICD-10-CM | POA: Diagnosis not present

## 2018-12-20 DIAGNOSIS — A4159 Other Gram-negative sepsis: Secondary | ICD-10-CM | POA: Diagnosis present

## 2018-12-20 DIAGNOSIS — H548 Legal blindness, as defined in USA: Secondary | ICD-10-CM | POA: Diagnosis present

## 2018-12-20 DIAGNOSIS — A064 Amebic liver abscess: Secondary | ICD-10-CM | POA: Diagnosis not present

## 2018-12-20 DIAGNOSIS — B952 Enterococcus as the cause of diseases classified elsewhere: Secondary | ICD-10-CM | POA: Diagnosis not present

## 2018-12-20 DIAGNOSIS — B9689 Other specified bacterial agents as the cause of diseases classified elsewhere: Secondary | ICD-10-CM | POA: Diagnosis not present

## 2018-12-20 DIAGNOSIS — K219 Gastro-esophageal reflux disease without esophagitis: Secondary | ICD-10-CM | POA: Diagnosis present

## 2018-12-20 DIAGNOSIS — I120 Hypertensive chronic kidney disease with stage 5 chronic kidney disease or end stage renal disease: Secondary | ICD-10-CM | POA: Diagnosis not present

## 2018-12-20 DIAGNOSIS — E876 Hypokalemia: Secondary | ICD-10-CM | POA: Diagnosis not present

## 2018-12-20 DIAGNOSIS — K381 Appendicular concretions: Secondary | ICD-10-CM | POA: Diagnosis present

## 2018-12-20 DIAGNOSIS — R609 Edema, unspecified: Secondary | ICD-10-CM | POA: Diagnosis not present

## 2018-12-20 DIAGNOSIS — E1122 Type 2 diabetes mellitus with diabetic chronic kidney disease: Secondary | ICD-10-CM | POA: Diagnosis not present

## 2018-12-20 DIAGNOSIS — F1021 Alcohol dependence, in remission: Secondary | ICD-10-CM | POA: Diagnosis present

## 2018-12-20 HISTORY — DX: Abscess of liver: K75.0

## 2018-12-20 LAB — BLOOD CULTURE ID PANEL (REFLEXED)

## 2018-12-20 LAB — CBC
HCT: 28.8 % — ABNORMAL LOW (ref 36.0–46.0)
Hemoglobin: 8.6 g/dL — ABNORMAL LOW (ref 12.0–15.0)
MCH: 22.4 pg — ABNORMAL LOW (ref 26.0–34.0)
MCHC: 29.9 g/dL — ABNORMAL LOW (ref 30.0–36.0)
MCV: 75 fL — ABNORMAL LOW (ref 80.0–100.0)
Platelets: 391 10*3/uL (ref 150–400)
RBC: 3.84 MIL/uL — ABNORMAL LOW (ref 3.87–5.11)
RDW: 17.9 % — ABNORMAL HIGH (ref 11.5–15.5)
WBC: 38.1 10*3/uL — ABNORMAL HIGH (ref 4.0–10.5)
nRBC: 0 % (ref 0.0–0.2)

## 2018-12-20 LAB — COMPREHENSIVE METABOLIC PANEL
ALT: 37 U/L (ref 0–44)
AST: 78 U/L — ABNORMAL HIGH (ref 15–41)
Albumin: 2.2 g/dL — ABNORMAL LOW (ref 3.5–5.0)
Alkaline Phosphatase: 95 U/L (ref 38–126)
Anion gap: 17 — ABNORMAL HIGH (ref 5–15)
BUN: 44 mg/dL — ABNORMAL HIGH (ref 6–20)
CO2: 27 mmol/L (ref 22–32)
Calcium: 7.9 mg/dL — ABNORMAL LOW (ref 8.9–10.3)
Chloride: 92 mmol/L — ABNORMAL LOW (ref 98–111)
Creatinine, Ser: 9.19 mg/dL — ABNORMAL HIGH (ref 0.44–1.00)
GFR calc Af Amer: 5 mL/min — ABNORMAL LOW (ref >60)
GFR calc non Af Amer: 4 mL/min — ABNORMAL LOW (ref >60)
Glucose, Bld: 250 mg/dL — ABNORMAL HIGH (ref 70–99)
Potassium: 3.7 mmol/L (ref 3.5–5.1)
Sodium: 136 mmol/L (ref 135–145)
Total Bilirubin: 0.8 mg/dL (ref 0.3–1.2)
Total Protein: 6.6 g/dL (ref 6.5–8.1)

## 2018-12-20 LAB — PROTIME-INR
INR: 1.23
Prothrombin Time: 15.3 seconds — ABNORMAL HIGH (ref 11.4–15.2)

## 2018-12-20 LAB — PHOSPHORUS: Phosphorus: 6.7 mg/dL — ABNORMAL HIGH (ref 2.5–4.6)

## 2018-12-20 LAB — CBG MONITORING, ED
Glucose-Capillary: 246 mg/dL — ABNORMAL HIGH (ref 70–99)
Glucose-Capillary: 165 mg/dL — ABNORMAL HIGH (ref 70–99)

## 2018-12-20 LAB — I-STAT CG4 LACTIC ACID, ED: Lactic Acid, Venous: 1.82 mmol/L (ref 0.5–1.9)

## 2018-12-20 LAB — GLUCOSE, CAPILLARY
Glucose-Capillary: 182 mg/dL — ABNORMAL HIGH (ref 70–99)
Glucose-Capillary: 178 mg/dL — ABNORMAL HIGH (ref 70–99)

## 2018-12-20 MED ORDER — SODIUM CHLORIDE 0.9 % IV SOLN: 100.0000 mL | INTRAVENOUS | Status: DC | PRN

## 2018-12-20 MED ORDER — NORTRIPTYLINE HCL 25 MG PO CAPS: 25.0000 mg | ORAL_CAPSULE | Freq: Every day | ORAL | Status: DC

## 2018-12-20 MED ORDER — HEPARIN SODIUM (PORCINE) 5000 UNIT/ML IJ SOLN
5000.0000 [IU] | Freq: Three times a day (TID) | INTRAMUSCULAR | Status: DC
  Administered 2018-12-20: 5000 [IU] via SUBCUTANEOUS
  Filled 2018-12-20: qty 1

## 2018-12-20 MED ORDER — SODIUM CHLORIDE 0.9 % IV SOLN
INTRAVENOUS | Status: AC
  Administered 2018-12-20: 20:00:00 via INTRAVENOUS

## 2018-12-20 MED ORDER — LOSARTAN POTASSIUM 50 MG PO TABS
50.0000 mg | ORAL_TABLET | Freq: Every day | ORAL | Status: DC
  Administered 2018-12-20 – 2018-12-26 (×6): 50 mg via ORAL
  Filled 2018-12-20 (×6): qty 1

## 2018-12-20 MED ORDER — SODIUM CHLORIDE 0.9 % IV SOLN
INTRAVENOUS | Status: DC | PRN
  Administered 2018-12-20: 10 mL via INTRAVENOUS

## 2018-12-20 MED ORDER — LORAZEPAM 2 MG/ML IJ SOLN
1.0000 mg | Freq: Four times a day (QID) | INTRAMUSCULAR | Status: DC | PRN
Start: 2018-12-20 — End: 2018-12-23
  Administered 2018-12-22: 1 mg via INTRAVENOUS
  Filled 2018-12-20: qty 1

## 2018-12-20 MED ORDER — VANCOMYCIN HCL IN DEXTROSE 1-5 GM/200ML-% IV SOLN
INTRAVENOUS | Status: AC
  Administered 2018-12-20: 1000 mg via INTRAVENOUS
  Filled 2018-12-20: qty 200

## 2018-12-20 MED ORDER — METOPROLOL TARTRATE 50 MG PO TABS
50.0000 mg | ORAL_TABLET | Freq: Two times a day (BID) | ORAL | Status: DC
  Administered 2018-12-20 – 2018-12-24 (×8): 50 mg via ORAL
  Filled 2018-12-20 (×5): qty 1
  Filled 2018-12-20: qty 2
  Filled 2018-12-20 (×2): qty 1

## 2018-12-20 MED ORDER — INSULIN ASPART 100 UNIT/ML ~~LOC~~ SOLN
0.0000 [IU] | Freq: Three times a day (TID) | SUBCUTANEOUS | Status: DC
  Administered 2018-12-20 – 2018-12-21 (×2): 3 [IU] via SUBCUTANEOUS
  Administered 2018-12-22: 1 [IU] via SUBCUTANEOUS
  Administered 2018-12-23: 2 [IU] via SUBCUTANEOUS
  Administered 2018-12-23 (×2): 3 [IU] via SUBCUTANEOUS
  Administered 2018-12-24: 2 [IU] via SUBCUTANEOUS
  Administered 2018-12-24 (×2): 5 [IU] via SUBCUTANEOUS
  Administered 2018-12-25: 3 [IU] via SUBCUTANEOUS
  Administered 2018-12-26: 2 [IU] via SUBCUTANEOUS
  Administered 2018-12-26: 1 [IU] via SUBCUTANEOUS

## 2018-12-20 MED ORDER — VANCOMYCIN HCL IN DEXTROSE 1-5 GM/200ML-% IV SOLN
1000.0000 mg | INTRAVENOUS | Status: DC
  Administered 2018-12-20: 1000 mg via INTRAVENOUS

## 2018-12-20 MED ORDER — AMLODIPINE BESYLATE 10 MG PO TABS
10.0000 mg | ORAL_TABLET | Freq: Every day | ORAL | Status: DC
  Administered 2018-12-20 – 2018-12-26 (×6): 10 mg via ORAL
  Filled 2018-12-20 (×3): qty 1
  Filled 2018-12-20: qty 2
  Filled 2018-12-20 (×2): qty 1

## 2018-12-20 MED ORDER — SODIUM CHLORIDE 0.9 % IV SOLN
1.0000 g | INTRAVENOUS | Status: DC
  Administered 2018-12-20 – 2018-12-21 (×2): 1 g via INTRAVENOUS
  Filled 2018-12-20 (×2): qty 1

## 2018-12-20 MED ORDER — DARBEPOETIN ALFA 100 MCG/0.5ML IJ SOSY: 100.0000 ug | PREFILLED_SYRINGE | INTRAMUSCULAR | Status: DC

## 2018-12-20 MED ORDER — LORAZEPAM 1 MG PO TABS
1.0000 mg | ORAL_TABLET | Freq: Four times a day (QID) | ORAL | Status: DC | PRN
  Administered 2018-12-22: 1 mg via ORAL
  Filled 2018-12-20: qty 1

## 2018-12-20 MED ORDER — ONDANSETRON HCL 4 MG/2ML IJ SOLN
INTRAMUSCULAR | Status: AC
  Filled 2018-12-20: qty 2

## 2018-12-20 MED ORDER — ATORVASTATIN CALCIUM 40 MG PO TABS
40.0000 mg | ORAL_TABLET | Freq: Every day | ORAL | Status: DC
  Administered 2018-12-20 – 2018-12-26 (×7): 40 mg via ORAL
  Filled 2018-12-20 (×7): qty 1

## 2018-12-20 MED ORDER — ONDANSETRON HCL 4 MG/2ML IJ SOLN
4.0000 mg | Freq: Once | INTRAMUSCULAR | Status: AC
  Administered 2018-12-20: 4 mg via INTRAVENOUS

## 2018-12-20 MED ORDER — FERRIC CITRATE 1 GM 210 MG(FE) PO TABS
420.0000 mg | ORAL_TABLET | Freq: Three times a day (TID) | ORAL | Status: DC
  Administered 2018-12-20 – 2018-12-21 (×3): 420 mg via ORAL
  Filled 2018-12-20 (×8): qty 2

## 2018-12-20 MED ORDER — DIALYVITE 800 0.8 MG PO TABS: 1.0000 | ORAL_TABLET | Freq: Every day | ORAL | Status: DC

## 2018-12-20 MED ORDER — PENTAFLUOROPROP-TETRAFLUOROETH EX AERO
1.0000 "application " | INHALATION_SPRAY | CUTANEOUS | Status: DC | PRN
  Filled 2018-12-20: qty 116

## 2018-12-20 MED ORDER — CHLORHEXIDINE GLUCONATE CLOTH 2 % EX PADS: 6.0000 | MEDICATED_PAD | Freq: Every day | CUTANEOUS | Status: DC

## 2018-12-20 MED ORDER — PANTOPRAZOLE SODIUM 20 MG PO TBEC: 20.0000 mg | DELAYED_RELEASE_TABLET | Freq: Every day | ORAL | Status: DC

## 2018-12-20 MED ORDER — ACETAMINOPHEN 325 MG PO TABS
650.0000 mg | ORAL_TABLET | Freq: Four times a day (QID) | ORAL | Status: DC | PRN
  Administered 2018-12-20 – 2018-12-21 (×4): 650 mg via ORAL
  Filled 2018-12-20 (×4): qty 2

## 2018-12-20 MED ORDER — HEPARIN SODIUM (PORCINE) 1000 UNIT/ML DIALYSIS
1000.0000 [IU] | INTRAMUSCULAR | Status: DC | PRN
  Filled 2018-12-20: qty 1

## 2018-12-20 MED ORDER — CALCITRIOL 0.5 MCG PO CAPS
1.0000 ug | ORAL_CAPSULE | Freq: Every day | ORAL | Status: DC
  Administered 2018-12-21 – 2018-12-26 (×7): 1 ug via ORAL
  Filled 2018-12-20 (×5): qty 2

## 2018-12-20 MED ORDER — PROMETHAZINE HCL 25 MG PO TABS
12.5000 mg | ORAL_TABLET | Freq: Four times a day (QID) | ORAL | Status: DC | PRN
  Administered 2018-12-20 – 2018-12-21 (×3): 12.5 mg via ORAL
  Filled 2018-12-20 (×3): qty 1

## 2018-12-20 MED ORDER — RENA-VITE PO TABS
1.0000 | ORAL_TABLET | Freq: Every day | ORAL | Status: DC
  Administered 2018-12-20 – 2018-12-25 (×6): 1 via ORAL
  Filled 2018-12-20 (×7): qty 1

## 2018-12-20 MED ORDER — ACETAMINOPHEN 650 MG RE SUPP
650.0000 mg | Freq: Four times a day (QID) | RECTAL | Status: DC | PRN
  Administered 2018-12-20: 650 mg via RECTAL
  Filled 2018-12-20: qty 1

## 2018-12-20 NOTE — Consult Note (Signed)
Chief Complaint: Patient was seen in consultation today for CT guided drainage of hepatic abscess Chief Complaint  Patient presents with  . Nausea  . Emesis  . Diarrhea    Referring Physician(s): Cornett,T  Supervising Physician: Aletta Edouard  Patient Status: [facility] - ED  History of Present Illness: [name] is a 53 y.o. female with hx CKD on HD via LUE fistula, DM,HTN, legally blind who presented to [facility] ED with fevers/ N/V/diarrhea. She is s/p lap appendectomy 2 weeks ago. Labs today include WBC 38.1, hgb 8.6, plts 391k, creat 9.19, K 3.7, normal t bili, AST 78, PT 15.3, INR 1.23.  CT A/P done 1/14 revealed:  1. Multiloculated cystic lesions demonstrated in the inferior liver, new since previous study, suggesting hepatic abscess. 2. Postoperative and/or inflammatory stranding and edema throughout the right lower quadrant fat with associated colonic wall thickening in the cecum and ascending colon. 3. Peritoneal nodule along the right lower quadrant anteriorly may represent   Request received for CCS for aspiration/possible drainage of hepatic fluid collection/?abscess  Past Medical History:  Diagnosis Date  . Alcohol dependency ([place])   . Anemia   . Chronic kidney disease   . Diabetes mellitus    type 2  . Diabetes mellitus type 2, uncontrolled ([place])   . Hypertension   . Legally blind in left eye, as defined in [place]   . Necrotizing fasciitis ([place])   . PONV (postoperative nausea and vomiting)     Past Surgical History:  Procedure Laterality Date  . AMPUTATION     right first and left middle finger amputation 2/2 OM 08/2011 by Dr. Amedeo Plenty  . AMPUTATION  01/18/2012   Procedure: AMPUTATION DIGIT;  Surgeon: Tennis Must, MD;  Location: [place];  Service: Orthopedics;  Laterality: Left;  revision amputation left thumb  . AMPUTATION  08/25/2012   Procedure: AMPUTATION BELOW KNEE;  Surgeon: Newt Minion, MD;  Location: [place];  Service:  Orthopedics;  Laterality: Right;  Right Below Knee Amputation  . BASCILIC VEIN TRANSPOSITION Left 07/22/2015   Procedure: BASILIC VEIN TRANSPOSITION ;  Surgeon: Elam Dutch, MD;  Location: [place];  Service: Vascular;  Laterality: Left;  . CATARACT EXTRACTION     right   . CESAREAN SECTION  1986  . CESAREAN SECTION  1985  . CESAREAN SECTION  1991  . I&D EXTREMITY  12/24/2011   Procedure: IRRIGATION AND DEBRIDEMENT EXTREMITY;  Surgeon: Tennis Must, MD;  Location: [place];  Service: Orthopedics;  Laterality: Left;  I&Dleft thumb with partial amputation  . I&D EXTREMITY  12/26/2011   Procedure: IRRIGATION AND DEBRIDEMENT EXTREMITY;  Surgeon: Tennis Must, MD;  Location: [place];  Service: Orthopedics;  Laterality: Left;  . I&D EXTREMITY  12/23/2011   Procedure: IRRIGATION AND DEBRIDEMENT EXTREMITY;  Surgeon: Tennis Must, MD;  Location: [place];  Service: Orthopedics;  Laterality: Left;  I&D Left thumb, hand and arm  . I&D left thumb  12/23/2011  . LAPAROSCOPIC APPENDECTOMY N/A 12/06/2018   Procedure: APPENDECTOMY LAPAROSCOPIC;  Surgeon: Kinsinger, Arta Bruce, MD;  Location: [place];  Service: General;  Laterality: N/A;  . TUBAL LIGATION      Allergies: Patient has no known allergies.  Medications: Prior to Admission medications   Medication Sig Start Date End Date Taking? Authorizing Provider  amLODipine (NORVASC) 10 MG tablet TAKE 1 TABLET BY MOUTH ONCE DAILY Patient taking differently: 10 mg daily.  05/11/18  Yes Rory Percy, DO  atorvastatin (  LIPITOR) 40 MG tablet TAKE 1 TABLET BY MOUTH ONCE DAILY Patient taking differently: Take 40 mg by mouth daily at 6 PM.  05/11/18  Yes Rumball, Bryson Ha, DO  AURYXIA 1 GM 210 MG(Fe) tablet Take 210-420 mg by mouth See admin instructions. Take 420 mg with meals and 210 mg with snacks 10/06/18  Yes [provider]  B Complex-C-Folic Acid (DIALYVITE 950) 0.8 MG TABS Take 1 tablet by mouth daily. 10/09/18  Yes [provider]  gabapentin  (NEURONTIN) 100 MG capsule TAKE 1 CAPSULE BY MOUTH ONCE DAILY AS NEEDED Patient taking differently: Take 100 mg by mouth daily.  11/01/18  Yes Rumball, Alison, DO  HUMULIN 70/30 KWIKPEN (70-30) 100 UNIT/ML PEN INJECT 10 UNITS SUBCUTANEOUSLY AT BREAKFAST AND AT SUPPER Patient taking differently: Inject 5-10 Units into the skin 2 (two) times daily.  11/01/18  Yes Rory Percy, DO  losartan (COZAAR) 50 MG tablet Take 1 tablet (50 mg total) by mouth daily. 08/10/18  Yes Rory Percy, DO  metoprolol tartrate (LOPRESSOR) 50 MG tablet TAKE 1 TABLET BY MOUTH TWICE DAILY Patient taking differently: Take 50 mg by mouth 2 (two) times daily.  08/10/18  Yes Rory Percy, DO  mupirocin cream (BACTROBAN) 2 % Apply 1 application topically 2 (two) times daily. 10/26/17  Yes Mayo, Pete Pelt, MD  Blood Glucose Monitoring Suppl (PRODIGY AUTOCODE BLOOD GLUCOSE) w/Device KIT 1 Device by Does not apply route 3 (three) times daily. 10/26/18   Rory Percy, DO  glucose blood test strip Use as instructed 10/26/18   Rory Percy, DO  Insulin Pen Needle (EASY TOUCH PEN NEEDLES) 32G X 4 MM MISC 1 each by Does not apply route 3 (three) times daily. 08/10/18   Rory Percy, DO  Lancet Devices (ONE TOUCH DELICA LANCING DEV) MISC Check blood sugars once daily 05/25/18   Mayo, Pete Pelt, MD  PRODIGY LANCETS 28G MISC 1 each by Does not apply route 3 (three) times daily. 10/26/18   Rory Percy, DO     Family History  Problem Relation Age of Onset  . Diabetes Mother   . Alcohol abuse Mother   . Diabetes Father   . Alzheimer's disease Father   . Hypertension Father   . Kidney failure Sister   . Diabetes Sister   . High blood pressure Sister     Social History   Socioeconomic History  . Marital status: Single    Spouse name: Not on file  . Number of children: 3  . Years of education: Not on file  . Highest education level: Not on file  Occupational History    Employer: [employer]  Social Needs  . Financial  resource strain: Not on file  . Food insecurity:    Worry: Not on file    Inability: Not on file  . Transportation needs:    Medical: Not on file    Non-medical: Not on file  Tobacco Use  . Smoking status: Never Smoker  . Smokeless tobacco: Never Used  Substance and Sexual Activity  . Alcohol use: Yes    Alcohol/week: 1.0 standard drinks    Types: 1 Cans of beer per week    Comment: h/o alcohol abuse 4 years ago (07/21/15)  . Drug use: Yes    Types: Cocaine    Comment:  last used cocaine 4 years ago (07/21/15)  . Sexual activity: Yes  Lifestyle  . Physical activity:    Days per week: Not on file    Minutes per session: Not  on file  . Stress: Not on file  Relationships  . Social connections:    Talks on phone: Not on file    Gets together: Not on file    Attends religious service: Not on file    Active member of club or organization: Not on file    Attends meetings of clubs or organizations: Not on file    Relationship status: Not on file  Other Topics Concern  . Not on file  Social History Narrative            Review of Systems see above; denies HA,CP, dyspnea, cough, back pain, abd pain or bleeding  Vital Signs: BP 109/61   Pulse 81   Temp 99.5 F (37.5 C) (Oral)   Resp (!) 21   SpO2 97%   Physical Exam awake/alert; chest: CTA bilat ; heart- RRR; abd soft,+BS,NT; no LE edema; rt BKA; left arm AVF with good thrill/bruit; rt first and left mid finger amputation  Imaging: Ct Abdomen Pelvis Wo Contrast  Result Date: 12/06/2018 CLINICAL DATA:  Acute onset of RIGHT LOWER QUADRANT abdominal pain that began yesterday during hemodialysis. Solitary episode of vomiting. EXAM: CT ABDOMEN AND PELVIS WITHOUT CONTRAST TECHNIQUE: Multidetector CT imaging of the abdomen and pelvis was performed following the standard protocol without IV contrast. COMPARISON:  None. FINDINGS: Lower chest: Atelectasis involving the lower lobes. Visualized lung bases otherwise clear. Heart  moderately enlarged. Hepatobiliary: Normal unenhanced appearance of the liver. Prominent Riedel's lobe. Gallbladder normal in appearance without calcified gallstones. No biliary ductal dilation. Pancreas: Normal unenhanced appearance. Spleen: Normal unenhanced appearance. Adrenals/Urinary Tract: Normal appearing adrenal glands. No evidence of urinary tract calculi. Within the limits of the unenhanced technique, no focal parenchymal abnormality involving either kidney. No evidence of hydronephrosis involving either kidney. Normal appearing urinary bladder. Stomach/Bowel: Very small hiatal hernia. Stomach decompressed and otherwise unremarkable. Normal-appearing small bowel. Mobile cecum positioned in the RIGHT UPPER QUADRANT of the abdomen. Moderate stool burden throughout the normal appearing colon. Appendix: Location: RIGHT UPPER pelvis, LATERAL retrocecal location. Diameter: 16 mm Appendicolith: Multiple. Mucosal hyper-enhancement: Moderate. Extraluminal gas: Absent. Periappendiceal collection: Absent. Marked edema/inflammation surrounding the appendix. Vascular/Lymphatic: Mild to moderate iliofemoral atherosclerosis. No evidence of aortic aneurysm. No pathologic lymphadenopathy. Reproductive: Normal-appearing uterus and ovaries without evidence of adnexal mass. Other: None. Musculoskeletal: BILATERAL L4 pars defects without evidence of spondylolisthesis. Mild degenerative disc disease at L4-5 with circumferential disc bulge. Mild multifactorial spinal stenosis at the L4-5 level. No acute findings. IMPRESSION: 1. Acute appendicitis. No evidence of perforation or abscess. 2. Atelectasis involving the lower lobes. 3. Very small hiatal hernia. 4. BILATERAL L4 pars defects without evidence of spondylolisthesis. Mild multifactorial spinal stenosis at L4-5. I telephoned these critical/emergent results to [facility], [place], in the emergency department at the time of interpretation on 12/06/2018 at 7:12 p.m.  Electronically Signed   By: Evangeline Dakin M.D.   On: 12/06/2018 19:13   Dg Chest 2 View  Result Date: 12/19/2018 CLINICAL DATA:  Nausea, vomiting, and diarrhea. Generalized body aches over the last day. History of hypertension and dialysis. EXAM: CHEST - 2 VIEW COMPARISON:  11/16/2017 FINDINGS: Mild cardiac enlargement. Linear atelectasis in the lung bases. No airspace disease or consolidation in the lungs. No blunting of costophrenic angles. No pneumothorax. Mediastinal contours appear intact. Degenerative changes in the shoulders. IMPRESSION: Mild cardiac enlargement. Linear atelectasis in the lung bases. No evidence of active pulmonary disease. Electronically Signed   By: Lucienne Capers M.D.   On: 12/19/2018 21:42  Ct Abdomen Pelvis W Contrast  Result Date: 12/19/2018 CLINICAL DATA:  Nausea, vomiting, and diarrhea. Generalized body aches for 1 day. Dialysis for 3 years. Appendectomy on 12/06/2018. EXAM: CT ABDOMEN AND PELVIS WITH CONTRAST TECHNIQUE: Multidetector CT imaging of the abdomen and pelvis was performed using the standard protocol following bolus administration of intravenous contrast. CONTRAST:  182m OMNIPAQUE IOHEXOL 300 MG/ML  SOLN COMPARISON:  12/06/2018 FINDINGS: Lower chest: Linear atelectasis in the lung bases. Hepatobiliary: Lobulated circumscribed cystic lesions demonstrated in the inferior liver, new since previous study, suggesting hepatic abscess. Largest lesion measures about 3.7 cm diameter. Gallbladder and bile ducts are unremarkable. Pancreas: Unremarkable. No pancreatic ductal dilatation or surrounding inflammatory changes. Spleen: Normal in size without focal abnormality. Adrenals/Urinary Tract: Adrenal glands are unremarkable. Kidneys are normal, without renal calculi, focal lesion, or hydronephrosis. Bladder is unremarkable. Stomach/Bowel: Stomach, small bowel, and colon are mostly decompressed, limiting evaluation of the wall. In the right lower quadrant, there is  evidence of infiltration in the right lower quadrant fat with some wall thickening suggested in the cecum and ascending colon. Minimal soft tissue edema without definite loculation. This may represent postoperative change or residual inflammatory changes from previous appendicitis. Recurrent inflammatory process could also be present. Vascular/Lymphatic: No significant vascular findings are present. No enlarged abdominal or pelvic lymph nodes. There a few scattered prominent lymph nodes in the right lower quadrant, likely reactive. Reproductive: Uterus and bilateral adnexa are unremarkable. Other: No free air in the abdomen. Peritoneal nodule along the right lower quadrant anteriorly measuring 1.4 cm diameter, not present previously. This could represent postoperative changes a suture granuloma or postoperative inflammatory reaction. Musculoskeletal: No acute or significant osseous findings. IMPRESSION: 1. Multiloculated cystic lesions demonstrated in the inferior liver, new since previous study, suggesting hepatic abscess. 2. Postoperative and/or inflammatory stranding and edema throughout the right lower quadrant fat with associated colonic wall thickening in the cecum and ascending colon. 3. Peritoneal nodule along the right lower quadrant anteriorly may represent postoperative changes or postoperative inflammatory reaction. Electronically Signed   By: WLucienne CapersM.D.   On: 12/19/2018 22:44    Labs:  CBC: Recent Labs    12/08/18 0723 12/09/18 0618 12/19/18 1932 12/20/18 0630  WBC 18.6* 13.9* 23.8* 38.1*  HGB 10.5* 11.5* 10.6* 8.6*  HCT 34.6* 38.0 35.1* 28.8*  PLT 198 225 394 391    COAGS: Recent Labs    12/20/18 0019  INR 1.23    BMP: Recent Labs    12/08/18 0723 12/09/18 0618 12/19/18 1932 12/20/18 0630  NA 138 137 139 136  K 4.5 3.9 3.8 3.7  CL 95* 96* 90* 92*  CO2 '24 25 24 27  ' GLUCOSE 184* 245* 199* 250*  BUN 73* 42* 38* 44*  CALCIUM 8.5* 8.3* 8.9 7.9*  CREATININE  9.41* 7.13* 8.23* 9.19*  GFRNONAA 4* 6* 5* 4*  GFRAA 5* 7* 6* 5*    LIVER FUNCTION TESTS: Recent Labs    12/06/18 1808 12/08/18 0723 12/19/18 1932 12/20/18 0630  BILITOT 0.4  --  1.3* 0.8  AST 15  --  47* 78*  ALT 12  --  25 37  ALKPHOS 70  --  115 95  PROT 7.4  --  8.2* 6.6  ALBUMIN 3.5 2.5* 2.8* 2.2*    TUMOR MARKERS: No results for input(s): AFPTM, CEA, CA199, CHROMGRNA in the last 8760 hours.  Assessment and Plan: 53y.o. female with hx CKD on HD via LUE fistula, DM,HTN, legally blind who [facility] fevers/ N/V/diarrhea. She is s/p lap appendectomy 2 weeks ago. Labs today include WBC 38.1, hgb 8.6, plts 391k, creat 9.19, K 3.7, normal t bili, AST 78, PT 15.3, INR 1.23.  CT A/P done 1/14 revealed:  1. Multiloculated cystic lesions demonstrated in the inferior liver, new since previous study, suggesting hepatic abscess. 2. Postoperative and/or inflammatory stranding and edema throughout the right lower quadrant fat with associated colonic wall thickening in the cecum and ascending colon. 3. Peritoneal nodule along the right lower quadrant anteriorly may represent   Request received for CCS for aspiration/possible drainage of hepatic fluid collection/?abscess; imaging studies were reviewed by Dr. Kathlene Cote.Risks and benefits discussed with the patient including bleeding, infection, damage to adjacent structures,  and sepsis.  All of the patient's questions were answered, patient is agreeable to proceed. Consent signed and in chart.  Procedure tent scheduled for this afternoon   Thank you for this interesting consult.  I greatly enjoyed meeting [name] and look forward to participating in their care.  A copy of this report was sent to the requesting provider on this date.  Electronically Signed: D. Rowe Robert, PA-C 12/20/2018, 11:52 AM   I spent a total of 30 minutes    in face to face in clinical consultation, greater than 50% of which was  counseling/coordinating care for hepatic fluid collection aspiration/possible drainage

## 2018-12-20 NOTE — Procedures (Signed)
Patient was seen on dialysis and the procedure was supervised.  BFR 350  Via AVF BP is  117/95.   Patient with nausea and diarrhea- refused to run full treatment- signed off with one hour 30 to go.    Louis Meckel 12/20/2018

## 2018-12-20 NOTE — Progress Notes (Signed)
PHARMACY - PHYSICIAN COMMUNICATION CRITICAL VALUE ALERT - BLOOD CULTURE IDENTIFICATION (BCID)  [name] is an 53 y.o. female who presented to [facility] on 12/19/2018 with a chief complaint of abdominal pan, nausea, vomiting and diarrhea.  Assessment:  Patient with gangrenous appendicitis and liver abscess on Vancomycin, Cefepime, and Flagyl. Blood cultures are 2 out of 4 positive for GVR. BCID detected Enterobacter cloacae complex (no resistance detected).   Name of physician (or Provider) Contacted: Family Medicine on call  Current antibiotics: Vancomycin, Cefepime, and Flagyl  Changes to prescribed antibiotics recommended:  Patient is on recommended antibiotics - No changes needed  Could consider discontinuing Vancomycin if no other possible sources of infection.   Results for orders placed or performed during the hospital encounter of 12/19/18  Blood Culture ID Panel (Reflexed) (Collected: 12/20/2018 12:21 AM)  Result Value Ref Range   Enterococcus species NOT DETECTED NOT DETECTED   Listeria monocytogenes NOT DETECTED NOT DETECTED   Staphylococcus species NOT DETECTED NOT DETECTED   Staphylococcus aureus (BCID) NOT DETECTED NOT DETECTED   Streptococcus species NOT DETECTED NOT DETECTED   Streptococcus agalactiae NOT DETECTED NOT DETECTED   Streptococcus pneumoniae NOT DETECTED NOT DETECTED   Streptococcus pyogenes NOT DETECTED NOT DETECTED   Acinetobacter baumannii NOT DETECTED NOT DETECTED   Enterobacteriaceae species DETECTED (A) NOT DETECTED   Enterobacter cloacae complex DETECTED (A) NOT DETECTED   Escherichia coli NOT DETECTED NOT DETECTED   Klebsiella oxytoca NOT DETECTED NOT DETECTED   Klebsiella pneumoniae NOT DETECTED NOT DETECTED   Proteus species NOT DETECTED NOT DETECTED   Serratia marcescens NOT DETECTED NOT DETECTED   Carbapenem resistance NOT DETECTED NOT DETECTED   Haemophilus influenzae NOT DETECTED NOT DETECTED   Neisseria meningitidis NOT DETECTED NOT  DETECTED   Pseudomonas aeruginosa NOT DETECTED NOT DETECTED   Candida albicans NOT DETECTED NOT DETECTED   Candida glabrata NOT DETECTED NOT DETECTED   Candida krusei NOT DETECTED NOT DETECTED   Candida parapsilosis NOT DETECTED NOT DETECTED   Candida tropicalis NOT DETECTED NOT DETECTED    Brain Hilts 12/20/2018  4:50 PM

## 2018-12-20 NOTE — ED Notes (Signed)
Ordered diet tray for pt  

## 2018-12-20 NOTE — Progress Notes (Signed)
Pharmacy Antibiotic Note  [name] is a 53 y.o. female admitted on 12/19/2018 with liver abscess.  Pharmacy has been consulted for Vancomycin/Cefepime dosing. WBC is elevated at 23.8. ESRD on HD MWF. Recent gangrenous appendicitis.   Plan: Vancomycin 2000 mg IV x 1, then given 1000 mg IV qHD MWF Cefepime 1g IV q24h Flagyl per MD Trend WBC, temp, HD schedule F/U infectious work-up Drug levels as indicated  Temp (24hrs), Avg:100.9 F (38.3 C), Min:99.5 F (37.5 C), Max:102 F (38.9 C)  Recent Labs  Lab 12/19/18 1932 12/19/18 2218 12/20/18 0030  WBC 23.8*  --   --   CREATININE 8.23*  --   --   LATICACIDVEN  --  1.70 1.82    Estimated Creatinine Clearance: 9.2 mL/min (A) (by C-G formula based on SCr of 8.23 mg/dL (H)).    No Known Allergies  Narda Bonds 12/20/2018 1:22 AM

## 2018-12-20 NOTE — ED Notes (Signed)
ED TO INPATIENT HANDOFF REPORT  Name/Age/Gender [name] 53 y.o. female  Code Status    Code Status Orders  (From admission, onward)         Start     Ordered   12/20/18 0549  Full code  Continuous     12/20/18 0548        Code Status History    Date Active Date Inactive Code Status Order ID Comments User Context   12/06/2018 2337 12/09/2018 1939 Full Code [id number]  Steve Rattler, DO Inpatient   07/13/2015 1650 07/16/2015 1200 Full Code [id number]  Janece Canterbury, MD Inpatient   08/25/2012 2205 08/30/2012 1934 Full Code [id number]  Sandrea Hughs, RN Inpatient   08/21/2012 0301 08/25/2012 2205 Full Code [id number]  Daylene Posey, RN Inpatient   12/23/2011 1045 12/29/2011 1908 Full Code [id number]  Verl Dicker, PA-C ED      Home/SNF/Other Home  Chief Complaint N/V/D   Level of Care/Admitting Diagnosis ED Disposition    ED Disposition Condition [place]: [place] [[id number]]  Level of Care: Medical Telemetry [104]  Diagnosis: Liver abscess [[id number]]  Admitting Physician: Alveda Reasons [place]  Attending Physician: Mingo Amber, JEFFREY H [[id number]]  Estimated length of stay: 3 - 4 days  Certification:: I certify this patient will need inpatient services for at least 2 midnights  PT Class (Do Not Modify): Inpatient [101]  PT Acc Code (Do Not Modify): Private [1]       Medical History Past Medical History:  Diagnosis Date  . Alcohol dependency ([place])   . Anemia   . Chronic kidney disease   . Diabetes mellitus    type 2  . Diabetes mellitus type 2, uncontrolled ([place])   . Hypertension   . Legally blind in left eye, as defined in [place]   . Necrotizing fasciitis ([place])   . PONV (postoperative nausea and vomiting)     Allergies No Known Allergies  IV Location/Drains/Wounds Patient Lines/Drains/Airways Status   Active Line/Drains/Airways    Name:   Placement date:   Placement time:   Site:   Days:   Peripheral IV 12/19/18  Right Wrist   12/19/18    2158    Wrist   1   Peripheral IV 12/20/18 Left External jugular   12/20/18    0757    External jugular   less than 1   Peripheral IV 12/20/18 Upper;Right Arm   12/20/18    0758    Arm   less than 1   Fistula / Graft Left Upper arm Arteriovenous fistula   07/22/15    1349    Upper arm   1247   Fistula / Graft Left Upper arm   -    -    Upper arm      Closed System Drain 1 Left LLQ Bulb (JP) 19 Fr.   12/06/18    2211    LLQ   14   Incision (Closed) 12/06/18 Abdomen Other (Comment)   12/06/18    2145     14   Incision - 3 Ports Abdomen 1: Umbilicus Mid;Upper 3: Left;Lower;Lateral   12/06/18    2146     14          Labs/Imaging Results for orders placed or performed during the hospital encounter of 12/19/18 (from the past 48 hour(s))  Lipase, blood     Status: Abnormal   Collection Time: 12/19/18  7:32  PM  Result Value Ref Range   Lipase 60 (H) 11 - 51 U/L    Comment: Performed at [facility], [place] [address] [place], [place] [address]  Comprehensive metabolic panel     Status: Abnormal   Collection Time: 12/19/18  7:32 PM  Result Value Ref Range   Sodium 139 135 - 145 mmol/L   Potassium 3.8 3.5 - 5.1 mmol/L   Chloride 90 (L) 98 - 111 mmol/L   CO2 24 22 - 32 mmol/L   Glucose, Bld 199 (H) 70 - 99 mg/dL   BUN 38 (H) 6 - 20 mg/dL   Creatinine, Ser 8.23 (H) 0.44 - 1.00 mg/dL   Calcium 8.9 8.9 - 10.3 mg/dL   Total Protein 8.2 (H) 6.5 - 8.1 g/dL   Albumin 2.8 (L) 3.5 - 5.0 g/dL   AST 47 (H) 15 - 41 U/L   ALT 25 0 - 44 U/L   Alkaline Phosphatase 115 38 - 126 U/L   Total Bilirubin 1.3 (H) 0.3 - 1.2 mg/dL   GFR calc non Af Amer 5 (L) >60 mL/min   GFR calc Af Amer 6 (L) >60 mL/min   Anion gap 25 (H) 5 - 15    Comment: Performed at [facility], [place] [address], [place], [place] [address]  CBC     Status: Abnormal   Collection Time: 12/19/18  7:32 PM  Result Value Ref Range   WBC 23.8 (H) 4.0 - 10.5 K/uL   RBC 4.64 3.87 - 5.11 MIL/uL   Hemoglobin  10.6 (L) 12.0 - 15.0 g/dL   HCT 35.1 (L) 36.0 - 46.0 %   MCV 75.6 (L) 80.0 - 100.0 fL   MCH 22.8 (L) 26.0 - 34.0 pg   MCHC 30.2 30.0 - 36.0 g/dL   RDW 18.1 (H) 11.5 - 15.5 %   Platelets 394 150 - 400 K/uL   nRBC 0.0 0.0 - 0.2 %    Comment: Performed at [facility], [place] [address], [place], [place] [address]  I-Stat beta hCG blood, ED     Status: None   Collection Time: 12/19/18  7:38 PM  Result Value Ref Range   I-stat hCG, quantitative <5.0 <5 mIU/mL   Comment 3            Comment:   GEST. AGE      CONC.  (mIU/mL)   <=1 WEEK        5 - 50     2 WEEKS       50 - 500     3 WEEKS       100 - 10,000     4 WEEKS     1,000 - 30,000        FEMALE AND NON-PREGNANT FEMALE:     LESS THAN 5 mIU/mL   Influenza panel by PCR (type A & B)     Status: None   Collection Time: 12/19/18  9:32 PM  Result Value Ref Range   Influenza A By PCR NEGATIVE NEGATIVE   Influenza B By PCR NEGATIVE NEGATIVE    Comment: (NOTE) The Xpert Xpress Flu assay is intended as an aid in the diagnosis of  influenza and should not be used as a sole basis for treatment.  This  assay is FDA approved for nasopharyngeal swab specimens only. Nasal  washings and aspirates are unacceptable for Xpert Xpress Flu testing. Performed at [facility], [place] [address]  [place], [place] [address]   Type and screen [place]     Status: None   Collection Time: 12/19/18 10:13 PM  Result Value Ref Range   ABO/RH(D) O NEG    Antibody Screen NEG    Sample Expiration      12/22/2018 Performed at [facility], [place] [address], [place], [place] [address]   POC occult blood, ED Provider will collect     Status: None   Collection Time: 12/19/18 10:16 PM  Result Value Ref Range   Fecal Occult Bld NEGATIVE NEGATIVE  I-Stat Troponin, ED (not at [facility])     Status: None   Collection Time: 12/19/18 10:16 PM  Result Value Ref Range   Troponin i, poc 0.01 0.00 - 0.08 ng/mL   Comment 3            Comment:  Due to the release kinetics of cTnI, a negative result within the first hours of the onset of symptoms does not rule out myocardial infarction with certainty. If myocardial infarction is still suspected, repeat the test at appropriate intervals.   I-Stat CG4 Lactic Acid, ED     Status: None   Collection Time: 12/19/18 10:18 PM  Result Value Ref Range   Lactic Acid, Venous 1.70 0.5 - 1.9 mmol/L  I-Stat beta hCG blood, ED     Status: None   Collection Time: 12/19/18 10:19 PM  Result Value Ref Range   I-stat hCG, quantitative <5.0 <5 mIU/mL   Comment 3            Comment:   GEST. AGE      CONC.  (mIU/mL)   <=1 WEEK        5 - 50     2 WEEKS       50 - 500     3 WEEKS       100 - 10,000     4 WEEKS     1,000 - 30,000        FEMALE AND NON-PREGNANT FEMALE:     LESS THAN 5 mIU/mL   Protime-INR     Status: Abnormal   Collection Time: 12/20/18 12:19 AM  Result Value Ref Range   Prothrombin Time 15.3 (H) 11.4 - 15.2 seconds   INR 1.23     Comment: Performed at [facility] [address] [place], [place] [address]  I-Stat CG4 Lactic Acid, ED     Status: None   Collection Time: 12/20/18 12:30 AM  Result Value Ref Range   Lactic Acid, Venous 1.82 0.5 - 1.9 mmol/L  CBC     Status: Abnormal   Collection Time: 12/20/18  6:30 AM  Result Value Ref Range   WBC 38.1 (H) 4.0 - 10.5 K/uL   RBC 3.84 (L) 3.87 - 5.11 MIL/uL   Hemoglobin 8.6 (L) 12.0 - 15.0 g/dL    Comment: Reticulocyte Hemoglobin testing may be clinically indicated, consider ordering this additional test [id number]    HCT 28.8 (L) 36.0 - 46.0 %   MCV 75.0 (L) 80.0 - 100.0 fL   MCH 22.4 (L) 26.0 - 34.0 pg   MCHC 29.9 (L) 30.0 - 36.0 g/dL   RDW 17.9 (H) 11.5 - 15.5 %   Platelets 391 150 - 400 K/uL   nRBC 0.0 0.0 - 0.2 %    Comment: Performed at [facility], [place] [address] [place], [place] [address]  Comprehensive metabolic panel  Status: Abnormal   Collection Time: 12/20/18  6:30 AM  Result Value Ref  Range   Sodium 136 135 - 145 mmol/L   Potassium 3.7 3.5 - 5.1 mmol/L   Chloride 92 (L) 98 - 111 mmol/L   CO2 27 22 - 32 mmol/L   Glucose, Bld 250 (H) 70 - 99 mg/dL   BUN 44 (H) 6 - 20 mg/dL   Creatinine, Ser 9.19 (H) 0.44 - 1.00 mg/dL   Calcium 7.9 (L) 8.9 - 10.3 mg/dL   Total Protein 6.6 6.5 - 8.1 g/dL   Albumin 2.2 (L) 3.5 - 5.0 g/dL   AST 78 (H) 15 - 41 U/L   ALT 37 0 - 44 U/L   Alkaline Phosphatase 95 38 - 126 U/L   Total Bilirubin 0.8 0.3 - 1.2 mg/dL   GFR calc non Af Amer 4 (L) >60 mL/min   GFR calc Af Amer 5 (L) >60 mL/min   Anion gap 17 (H) 5 - 15    Comment: Performed at [facility], [place] [address] [place], [place] [address]  CBG monitoring, ED     Status: Abnormal   Collection Time: 12/20/18  8:13 AM  Result Value Ref Range   Glucose-Capillary 246 (H) 70 - 99 mg/dL   Dg Chest 2 View  Result Date: 12/19/2018 CLINICAL DATA:  Nausea, vomiting, and diarrhea. Generalized body aches over the last day. History of hypertension and dialysis. EXAM: CHEST - 2 VIEW COMPARISON:  11/16/2017 FINDINGS: Mild cardiac enlargement. Linear atelectasis in the lung bases. No airspace disease or consolidation in the lungs. No blunting of costophrenic angles. No pneumothorax. Mediastinal contours appear intact. Degenerative changes in the shoulders. IMPRESSION: Mild cardiac enlargement. Linear atelectasis in the lung bases. No evidence of active pulmonary disease. Electronically Signed   By: Lucienne Capers M.D.   On: 12/19/2018 21:42   Ct Abdomen Pelvis W Contrast  Result Date: 12/19/2018 CLINICAL DATA:  Nausea, vomiting, and diarrhea. Generalized body aches for 1 day. Dialysis for 3 years. Appendectomy on 12/06/2018. EXAM: CT ABDOMEN AND PELVIS WITH CONTRAST TECHNIQUE: Multidetector CT imaging of the abdomen and pelvis was performed using the standard protocol following bolus administration of intravenous contrast. CONTRAST:  173mL OMNIPAQUE IOHEXOL 300 MG/ML  SOLN COMPARISON:  12/06/2018  FINDINGS: Lower chest: Linear atelectasis in the lung bases. Hepatobiliary: Lobulated circumscribed cystic lesions demonstrated in the inferior liver, new since previous study, suggesting hepatic abscess. Largest lesion measures about 3.7 cm diameter. Gallbladder and bile ducts are unremarkable. Pancreas: Unremarkable. No pancreatic ductal dilatation or surrounding inflammatory changes. Spleen: Normal in size without focal abnormality. Adrenals/Urinary Tract: Adrenal glands are unremarkable. Kidneys are normal, without renal calculi, focal lesion, or hydronephrosis. Bladder is unremarkable. Stomach/Bowel: Stomach, small bowel, and colon are mostly decompressed, limiting evaluation of the wall. In the right lower quadrant, there is evidence of infiltration in the right lower quadrant fat with some wall thickening suggested in the cecum and ascending colon. Minimal soft tissue edema without definite loculation. This may represent postoperative change or residual inflammatory changes from previous appendicitis. Recurrent inflammatory process could also be present. Vascular/Lymphatic: No significant vascular findings are present. No enlarged abdominal or pelvic lymph nodes. There a few scattered prominent lymph nodes in the right lower quadrant, likely reactive. Reproductive: Uterus and bilateral adnexa are unremarkable. Other: No free air in the abdomen. Peritoneal nodule along the right lower quadrant anteriorly measuring 1.4 cm diameter, not present previously. This could represent postoperative changes a suture granuloma or  postoperative inflammatory reaction. Musculoskeletal: No acute or significant osseous findings. IMPRESSION: 1. Multiloculated cystic lesions demonstrated in the inferior liver, new since previous study, suggesting hepatic abscess. 2. Postoperative and/or inflammatory stranding and edema throughout the right lower quadrant fat with associated colonic wall thickening in the cecum and ascending  colon. 3. Peritoneal nodule along the right lower quadrant anteriorly may represent postoperative changes or postoperative inflammatory reaction. Electronically Signed   By: Lucienne Capers M.D.   On: 12/19/2018 22:44    Pending Labs Unresulted Labs (From admission, onward)    Start     Ordered   12/20/18 0733  C difficile quick scan w PCR reflex  (C Difficile quick screen w PCR reflex panel)  Once, for 24 hours,   R     12/20/18 0732   12/19/18 2314  Blood culture (routine x 2)  BLOOD CULTURE X 2,   STAT     12/19/18 2313   12/19/18 1850  Urinalysis, Routine w reflex microscopic  ONCE - STAT,   STAT     12/19/18 1850   Signed and Held  Renal function panel  Once,   R     Signed and Held   Signed and Held  CBC  Once,   R     Signed and Held          Vitals/Pain Today's Vitals   12/20/18 0930 12/20/18 1000 12/20/18 1100 12/20/18 1115  BP: 112/63 107/63 109/61   Pulse: 92 88  81  Resp:    (!) 21  Temp:      TempSrc:      SpO2: 97% 97% 97% 97%  PainSc:        Isolation Precautions Enteric precautions (UV disinfection)  Medications Medications  metroNIDAZOLE (FLAGYL) IVPB 500 mg (0 mg Intravenous Stopped 12/20/18 0945)  amLODipine (NORVASC) tablet 10 mg (10 mg Oral Given 12/20/18 1011)  atorvastatin (LIPITOR) tablet 40 mg (40 mg Oral Given 12/20/18 1011)  losartan (COZAAR) tablet 50 mg (50 mg Oral Given 12/20/18 1011)  metoprolol tartrate (LOPRESSOR) tablet 50 mg (50 mg Oral Given 12/20/18 1011)  ferric citrate (AURYXIA) tablet 420 mg (420 mg Oral Given 12/20/18 1011)  acetaminophen (TYLENOL) tablet 650 mg (has no administration in time range)    Or  acetaminophen (TYLENOL) suppository 650 mg (has no administration in time range)  promethazine (PHENERGAN) tablet 12.5 mg (has no administration in time range)  insulin aspart (novoLOG) injection 0-9 Units (3 Units Subcutaneous Given 12/20/18 0831)  LORazepam (ATIVAN) tablet 1 mg (has no administration in time range)    Or   LORazepam (ATIVAN) injection 1 mg (has no administration in time range)  vancomycin (VANCOCIN) IVPB 1000 mg/200 mL premix (has no administration in time range)  ceFEPIme (MAXIPIME) 1 g in sodium chloride 0.9 % 100 mL IVPB (has no administration in time range)  Chlorhexidine Gluconate Cloth 2 % PADS 6 each (has no administration in time range)  pentafluoroprop-tetrafluoroeth (GEBAUERS) aerosol 1 application (has no administration in time range)  0.9 %  sodium chloride infusion (has no administration in time range)  0.9 %  sodium chloride infusion (has no administration in time range)  heparin injection 1,000 Units (has no administration in time range)  multivitamin (RENA-VIT) tablet 1 tablet (has no administration in time range)  acetaminophen (TYLENOL) tablet 650 mg (650 mg Oral Given 12/19/18 1932)  sodium chloride 0.9 % bolus 250 mL (0 mLs Intravenous Stopped 12/19/18 2316)  metoCLOPramide (REGLAN) injection 5 mg (5 mg  Intravenous Given 12/19/18 2201)  acetaminophen (TYLENOL) tablet 325 mg (325 mg Oral Given 12/19/18 2239)  pantoprazole (PROTONIX) injection 40 mg (40 mg Intravenous Given 12/19/18 2240)  iohexol (OMNIPAQUE) 300 MG/ML solution 100 mL (100 mLs Intravenous Contrast Given 12/19/18 2218)  sodium chloride 0.9 % bolus 250 mL (0 mLs Intravenous Stopped 12/20/18 0008)  ceFEPIme (MAXIPIME) 2 g in sodium chloride 0.9 % 100 mL IVPB (0 g Intravenous Stopped 12/20/18 0053)  vancomycin (VANCOCIN) 2,000 mg in sodium chloride 0.9 % 500 mL IVPB (0 mg Intravenous Stopped 12/20/18 0407)    Mobility walks

## 2018-12-20 NOTE — Consult Note (Signed)
Reason for Consult: Nausea vomiting diarrhea Referring Physician: Tyrone Nine do  [name] is an 53 y.o. female.  HPI: Patient presents with a one-day history of nausea vomiting and diarrhea.  Denies any cough shortness of breath or excessive sputum.  She had an appendectomy laparoscopically 2 weeks ago with Dr. Kieth Brightly.  CT scan shows a fluid collection in the right posterior lobe of the liver consistent with abscess.  She has no evidence of pelvic abscess.  She denies abdominal pain.  Fever of 102.  Patient felt sick yesterday at dialysis.  Past Medical History:  Diagnosis Date  . Alcohol dependency ([place])   . Anemia   . Chronic kidney disease   . Diabetes mellitus    type 2  . Diabetes mellitus type 2, uncontrolled ([place])   . Hypertension   . Legally blind in left eye, as defined in [place]   . Necrotizing fasciitis ([place])   . PONV (postoperative nausea and vomiting)     Past Surgical History:  Procedure Laterality Date  . AMPUTATION     right first and left middle finger amputation 2/2 OM 08/2011 by Dr. Amedeo Plenty  . AMPUTATION  01/18/2012   Procedure: AMPUTATION DIGIT;  Surgeon: Tennis Must, MD;  Location: [facility];  Service: Orthopedics;  Laterality: Left;  revision amputation left thumb  . AMPUTATION  08/25/2012   Procedure: AMPUTATION BELOW KNEE;  Surgeon: Newt Minion, MD;  Location: [facility];  Service: Orthopedics;  Laterality: Right;  Right Below Knee Amputation  . BASCILIC VEIN TRANSPOSITION Left 07/22/2015   Procedure: BASILIC VEIN TRANSPOSITION ;  Surgeon: Elam Dutch, MD;  Location: [facility];  Service: Vascular;  Laterality: Left;  . CATARACT EXTRACTION     right   . CESAREAN SECTION  1986  . CESAREAN SECTION  1985  . CESAREAN SECTION  1991  . I&D EXTREMITY  12/24/2011   Procedure: IRRIGATION AND DEBRIDEMENT EXTREMITY;  Surgeon: Tennis Must, MD;  Location: [facility];  Service: Orthopedics;  Laterality: Left;  I&Dleft thumb with partial amputation  . I&D  EXTREMITY  12/26/2011   Procedure: IRRIGATION AND DEBRIDEMENT EXTREMITY;  Surgeon: Tennis Must, MD;  Location: [facility];  Service: Orthopedics;  Laterality: Left;  . I&D EXTREMITY  12/23/2011   Procedure: IRRIGATION AND DEBRIDEMENT EXTREMITY;  Surgeon: Tennis Must, MD;  Location: [facility];  Service: Orthopedics;  Laterality: Left;  I&D Left thumb, hand and arm  . I&D left thumb  12/23/2011  . LAPAROSCOPIC APPENDECTOMY N/A 12/06/2018   Procedure: APPENDECTOMY LAPAROSCOPIC;  Surgeon: Kinsinger, Arta Bruce, MD;  Location: [facility];  Service: General;  Laterality: N/A;  . TUBAL LIGATION      Family History  Problem Relation Age of Onset  . Diabetes Mother   . Alcohol abuse Mother   . Diabetes Father   . Alzheimer's disease Father   . Hypertension Father   . Kidney failure Sister   . Diabetes Sister   . High blood pressure Sister     Social History:  reports that she has never smoked. She has never used smokeless tobacco. She reports current alcohol use of about 1.0 standard drinks of alcohol per week. She reports current drug use. Drug: Cocaine.  Allergies: No Known Allergies  Medications: I have reviewed the patient's current medications.  Results for orders placed or performed during the hospital encounter of 12/19/18 (from the past 48 hour(s))  Lipase, blood     Status: Abnormal   Collection  Time: 12/19/18  7:32 PM  Result Value Ref Range   Lipase 60 (H) 11 - 51 U/L    Comment: Performed at [facility], [place] [address], [place], [place] [address]  Comprehensive metabolic panel     Status: Abnormal   Collection Time: 12/19/18  7:32 PM  Result Value Ref Range   Sodium 139 135 - 145 mmol/L   Potassium 3.8 3.5 - 5.1 mmol/L   Chloride 90 (L) 98 - 111 mmol/L   CO2 24 22 - 32 mmol/L   Glucose, Bld 199 (H) 70 - 99 mg/dL   BUN 38 (H) 6 - 20 mg/dL   Creatinine, Ser 8.23 (H) 0.44 - 1.00 mg/dL   Calcium 8.9 8.9 - 10.3 mg/dL   Total Protein 8.2 (H) 6.5 - 8.1 g/dL   Albumin 2.8 (L) 3.5 -  5.0 g/dL   AST 47 (H) 15 - 41 U/L   ALT 25 0 - 44 U/L   Alkaline Phosphatase 115 38 - 126 U/L   Total Bilirubin 1.3 (H) 0.3 - 1.2 mg/dL   GFR calc non Af Amer 5 (L) >60 mL/min   GFR calc Af Amer 6 (L) >60 mL/min   Anion gap 25 (H) 5 - 15    Comment: Performed at [facility], [place] [address] [place], [place] [address]  CBC     Status: Abnormal   Collection Time: 12/19/18  7:32 PM  Result Value Ref Range   WBC 23.8 (H) 4.0 - 10.5 K/uL   RBC 4.64 3.87 - 5.11 MIL/uL   Hemoglobin 10.6 (L) 12.0 - 15.0 g/dL   HCT 35.1 (L) 36.0 - 46.0 %   MCV 75.6 (L) 80.0 - 100.0 fL   MCH 22.8 (L) 26.0 - 34.0 pg   MCHC 30.2 30.0 - 36.0 g/dL   RDW 18.1 (H) 11.5 - 15.5 %   Platelets 394 150 - 400 K/uL   nRBC 0.0 0.0 - 0.2 %    Comment: Performed at [facility], [place] [address] [place], [place] [address]  I-Stat beta hCG blood, ED     Status: None   Collection Time: 12/19/18  7:38 PM  Result Value Ref Range   I-stat hCG, quantitative <5.0 <5 mIU/mL   Comment 3            Comment:   GEST. AGE      CONC.  (mIU/mL)   <=1 WEEK        5 - 50     2 WEEKS       50 - 500     3 WEEKS       100 - 10,000     4 WEEKS     1,000 - 30,000        FEMALE AND NON-PREGNANT FEMALE:     LESS THAN 5 mIU/mL   Influenza panel by PCR (type A & B)     Status: None   Collection Time: 12/19/18  9:32 PM  Result Value Ref Range   Influenza A By PCR NEGATIVE NEGATIVE   Influenza B By PCR NEGATIVE NEGATIVE    Comment: (NOTE) The Xpert Xpress Flu assay is intended as an aid in the diagnosis of  influenza and should not be used as a sole basis for treatment.  This  assay is FDA approved for nasopharyngeal swab specimens only. Nasal  washings and aspirates are unacceptable for Xpert Xpress Flu testing. Performed at [facility],  [address] [place], [place] [address]   Type and screen [place]     Status: None   Collection Time: 12/19/18 10:13 PM  Result Value Ref Range   ABO/RH(D) O  NEG    Antibody Screen NEG    Sample Expiration      12/22/2018 Performed at [facility], [place] [address] [place], [place] [address]   POC occult blood, ED Provider will collect     Status: None   Collection Time: 12/19/18 10:16 PM  Result Value Ref Range   Fecal Occult Bld NEGATIVE NEGATIVE  I-Stat Troponin, ED (not at [facility])     Status: None   Collection Time: 12/19/18 10:16 PM  Result Value Ref Range   Troponin i, poc 0.01 0.00 - 0.08 ng/mL   Comment 3            Comment: Due to the release kinetics of cTnI, a negative result within the first hours of the onset of symptoms does not rule out myocardial infarction with certainty. If myocardial infarction is still suspected, repeat the test at appropriate intervals.   I-Stat CG4 Lactic Acid, ED     Status: None   Collection Time: 12/19/18 10:18 PM  Result Value Ref Range   Lactic Acid, Venous 1.70 0.5 - 1.9 mmol/L  I-Stat beta hCG blood, ED     Status: None   Collection Time: 12/19/18 10:19 PM  Result Value Ref Range   I-stat hCG, quantitative <5.0 <5 mIU/mL   Comment 3            Comment:   GEST. AGE      CONC.  (mIU/mL)   <=1 WEEK        5 - 50     2 WEEKS       50 - 500     3 WEEKS       100 - 10,000     4 WEEKS     1,000 - 30,000        FEMALE AND NON-PREGNANT FEMALE:     LESS THAN 5 mIU/mL     Dg Chest 2 View  Result Date: 12/19/2018 CLINICAL DATA:  Nausea, vomiting, and diarrhea. Generalized body aches over the last day. History of hypertension and dialysis. EXAM: CHEST - 2 VIEW COMPARISON:  11/16/2017 FINDINGS: Mild cardiac enlargement. Linear atelectasis in the lung bases. No airspace disease or consolidation in the lungs. No blunting of costophrenic angles. No pneumothorax. Mediastinal contours appear intact. Degenerative changes in the shoulders. IMPRESSION: Mild cardiac enlargement. Linear atelectasis in the lung bases. No evidence of active pulmonary disease. Electronically Signed   By: Lucienne Capers M.D.    On: 12/19/2018 21:42   Ct Abdomen Pelvis W Contrast  Result Date: 12/19/2018 CLINICAL DATA:  Nausea, vomiting, and diarrhea. Generalized body aches for 1 day. Dialysis for 3 years. Appendectomy on 12/06/2018. EXAM: CT ABDOMEN AND PELVIS WITH CONTRAST TECHNIQUE: Multidetector CT imaging of the abdomen and pelvis was performed using the standard protocol following bolus administration of intravenous contrast. CONTRAST:  18mL OMNIPAQUE IOHEXOL 300 MG/ML  SOLN COMPARISON:  12/06/2018 FINDINGS: Lower chest: Linear atelectasis in the lung bases. Hepatobiliary: Lobulated circumscribed cystic lesions demonstrated in the inferior liver, new since previous study, suggesting hepatic abscess. Largest lesion measures about 3.7 cm diameter. Gallbladder and bile ducts are unremarkable. Pancreas: Unremarkable. No pancreatic ductal dilatation or surrounding inflammatory changes. Spleen: Normal in size without focal abnormality. Adrenals/Urinary Tract: Adrenal glands  are unremarkable. Kidneys are normal, without renal calculi, focal lesion, or hydronephrosis. Bladder is unremarkable. Stomach/Bowel: Stomach, small bowel, and colon are mostly decompressed, limiting evaluation of the wall. In the right lower quadrant, there is evidence of infiltration in the right lower quadrant fat with some wall thickening suggested in the cecum and ascending colon. Minimal soft tissue edema without definite loculation. This may represent postoperative change or residual inflammatory changes from previous appendicitis. Recurrent inflammatory process could also be present. Vascular/Lymphatic: No significant vascular findings are present. No enlarged abdominal or pelvic lymph nodes. There a few scattered prominent lymph nodes in the right lower quadrant, likely reactive. Reproductive: Uterus and bilateral adnexa are unremarkable. Other: No free air in the abdomen. Peritoneal nodule along the right lower quadrant anteriorly measuring 1.4 cm  diameter, not present previously. This could represent postoperative changes a suture granuloma or postoperative inflammatory reaction. Musculoskeletal: No acute or significant osseous findings. IMPRESSION: 1. Multiloculated cystic lesions demonstrated in the inferior liver, new since previous study, suggesting hepatic abscess. 2. Postoperative and/or inflammatory stranding and edema throughout the right lower quadrant fat with associated colonic wall thickening in the cecum and ascending colon. 3. Peritoneal nodule along the right lower quadrant anteriorly may represent postoperative changes or postoperative inflammatory reaction. Electronically Signed   By: Lucienne Capers M.D.   On: 12/19/2018 22:44    Review of Systems  Constitutional: Positive for chills and fever.  HENT: Negative for hearing loss.   Eyes: Negative for blurred vision.  Respiratory: Negative for cough, hemoptysis and sputum production.   Cardiovascular: Negative for chest pain and palpitations.  Gastrointestinal: Positive for diarrhea, nausea and vomiting. Negative for abdominal pain.  Musculoskeletal: Positive for myalgias.  Skin: Negative for rash.  Neurological: Negative for dizziness.  Endo/Heme/Allergies: Does not bruise/bleed easily.  Psychiatric/Behavioral: Negative for depression and suicidal ideas.   Blood pressure (!) 117/97, pulse (!) 117, temperature (!) 102 F (38.9 C), temperature source Oral, resp. rate (!) 26, SpO2 91 %. Physical Exam  Constitutional: She appears well-developed and well-nourished.  HENT:  Head: Normocephalic.  Eyes: Pupils are equal, round, and reactive to light. EOM are normal.  Neck: Normal range of motion.  Cardiovascular: Normal rate and regular rhythm.  Respiratory: Effort normal and breath sounds normal.  GI: Soft. She exhibits no distension. There is no abdominal tenderness. There is no rebound.  Port site is intact  Musculoskeletal: Normal range of motion.  Neurological: She  is alert.    Assessment/Plan: 2-week status post laparoscopic appendectomy  Nausea vomiting diarrhea-not sure if these are related.  Check for C. difficile.  She does have a liver abscess it appears and therefore consultation with interventional radiology recommended in the morning.  She can have clear liquids from our standpoint.  Recommend medical admission due to her complex medical history.  Demarkus Remmel A Hayes Rehfeldt 12/20/2018, 12:07 AM

## 2018-12-20 NOTE — Progress Notes (Signed)
[facility] Progress Note     Subjective: CC: diarrhea Patient denies abdominal pain currently. No nausea. Ate liquid breakfast.   Objective: Vital signs in last 24 hours: Temp:  [99.5 F (37.5 C)-102 F (38.9 C)] 99.5 F (37.5 C) (01/15 0116) Pulse Rate:  [91-120] 92 (01/15 0930) Resp:  [17-26] 20 (01/15 0600) BP: (112-198)/(46-97) 112/63 (01/15 0930) SpO2:  [91 %-100 %] 97 % (01/15 0930)    Intake/Output from previous day: No intake/output data recorded. Intake/Output this shift: Total I/O In: 100 [IV Piggyback:100] Out: -   PE: Gen:  Alert, NAD, pleasant Card:  Regular rate and rhythm Pulm:  Normal effort, clear to auscultation bilaterally Abd: Soft, non-tender, non-distended, bowel sounds present, incisions c/d/i   Lab Results:  Recent Labs    12/19/18 1932 12/20/18 0630  WBC 23.8* 38.1*  HGB 10.6* 8.6*  HCT 35.1* 28.8*  PLT 394 391   BMET Recent Labs    12/19/18 1932 12/20/18 0630  NA 139 136  K 3.8 3.7  CL 90* 92*  CO2 24 27  GLUCOSE 199* 250*  BUN 38* 44*  CREATININE 8.23* 9.19*  CALCIUM 8.9 7.9*   PT/INR Recent Labs    12/20/18 0019  LABPROT 15.3*  INR 1.23   CMP     Component Value Date/Time   NA 136 12/20/2018 0630   NA 139 09/12/2013 0736   K 3.7 12/20/2018 0630   K 5.2 (H) 11/07/2013 0802   CL 92 (L) 12/20/2018 0630   CL 111 (H) 09/12/2013 0736   CO2 27 12/20/2018 0630   CO2 21 09/12/2013 0736   GLUCOSE 250 (H) 12/20/2018 0630   GLUCOSE 181 (H) 09/12/2013 0736   BUN 44 (H) 12/20/2018 0630   BUN 35 (H) 09/12/2013 0736   CREATININE 9.19 (H) 12/20/2018 0630   CREATININE 1.55 (H) 09/12/2013 0736   CREATININE 0.94 09/12/2012 1113   CALCIUM 7.9 (L) 12/20/2018 0630   CALCIUM 7.6 (L) 03/31/2016 1101   PROT 6.6 12/20/2018 0630   ALBUMIN 2.2 (L) 12/20/2018 0630   AST 78 (H) 12/20/2018 0630   ALT 37 12/20/2018 0630   ALKPHOS 95 12/20/2018 0630   BILITOT 0.8 12/20/2018 0630   GFRNONAA 4 (L) 12/20/2018 0630    GFRNONAA 40 (L) 09/12/2013 0736   GFRNONAA 74 09/12/2012 1113   GFRAA 5 (L) 12/20/2018 0630   GFRAA 46 (L) 09/12/2013 0736   GFRAA 85 09/12/2012 1113   Lipase     Component Value Date/Time   LIPASE 60 (H) 12/19/2018 1932       Studies/Results: Dg Chest 2 View  Result Date: 12/19/2018 CLINICAL DATA:  Nausea, vomiting, and diarrhea. Generalized body aches over the last day. History of hypertension and dialysis. EXAM: CHEST - 2 VIEW COMPARISON:  11/16/2017 FINDINGS: Mild cardiac enlargement. Linear atelectasis in the lung bases. No airspace disease or consolidation in the lungs. No blunting of costophrenic angles. No pneumothorax. Mediastinal contours appear intact. Degenerative changes in the shoulders. IMPRESSION: Mild cardiac enlargement. Linear atelectasis in the lung bases. No evidence of active pulmonary disease. Electronically Signed   By: Lucienne Capers M.D.   On: 12/19/2018 21:42   Ct Abdomen Pelvis W Contrast  Result Date: 12/19/2018 CLINICAL DATA:  Nausea, vomiting, and diarrhea. Generalized body aches for 1 day. Dialysis for 3 years. Appendectomy on 12/06/2018. EXAM: CT ABDOMEN AND PELVIS WITH CONTRAST TECHNIQUE: Multidetector CT imaging of the abdomen and pelvis was performed using the standard protocol following bolus administration of intravenous contrast.  CONTRAST:  147mL OMNIPAQUE IOHEXOL 300 MG/ML  SOLN COMPARISON:  12/06/2018 FINDINGS: Lower chest: Linear atelectasis in the lung bases. Hepatobiliary: Lobulated circumscribed cystic lesions demonstrated in the inferior liver, new since previous study, suggesting hepatic abscess. Largest lesion measures about 3.7 cm diameter. Gallbladder and bile ducts are unremarkable. Pancreas: Unremarkable. No pancreatic ductal dilatation or surrounding inflammatory changes. Spleen: Normal in size without focal abnormality. Adrenals/Urinary Tract: Adrenal glands are unremarkable. Kidneys are normal, without renal calculi, focal lesion, or  hydronephrosis. Bladder is unremarkable. Stomach/Bowel: Stomach, small bowel, and colon are mostly decompressed, limiting evaluation of the wall. In the right lower quadrant, there is evidence of infiltration in the right lower quadrant fat with some wall thickening suggested in the cecum and ascending colon. Minimal soft tissue edema without definite loculation. This may represent postoperative change or residual inflammatory changes from previous appendicitis. Recurrent inflammatory process could also be present. Vascular/Lymphatic: No significant vascular findings are present. No enlarged abdominal or pelvic lymph nodes. There a few scattered prominent lymph nodes in the right lower quadrant, likely reactive. Reproductive: Uterus and bilateral adnexa are unremarkable. Other: No free air in the abdomen. Peritoneal nodule along the right lower quadrant anteriorly measuring 1.4 cm diameter, not present previously. This could represent postoperative changes a suture granuloma or postoperative inflammatory reaction. Musculoskeletal: No acute or significant osseous findings. IMPRESSION: 1. Multiloculated cystic lesions demonstrated in the inferior liver, new since previous study, suggesting hepatic abscess. 2. Postoperative and/or inflammatory stranding and edema throughout the right lower quadrant fat with associated colonic wall thickening in the cecum and ascending colon. 3. Peritoneal nodule along the right lower quadrant anteriorly may represent postoperative changes or postoperative inflammatory reaction. Electronically Signed   By: Lucienne Capers M.D.   On: 12/19/2018 22:44    Anti-infectives: Anti-infectives (From admission, onward)   Start     Dose/Rate Route Frequency Ordered Stop   12/20/18 2200  ceFEPIme (MAXIPIME) 1 g in sodium chloride 0.9 % 100 mL IVPB     1 g 200 mL/hr over 30 Minutes Intravenous Every 24 hours 12/20/18 0126     12/20/18 1200  vancomycin (VANCOCIN) IVPB 1000 mg/200 mL premix      1,000 mg 200 mL/hr over 60 Minutes Intravenous Every M-W-F (Hemodialysis) 12/20/18 0126     12/19/18 2330  metroNIDAZOLE (FLAGYL) IVPB 500 mg     500 mg 100 mL/hr over 60 Minutes Intravenous Every 8 hours 12/19/18 2320     12/19/18 2330  ceFEPIme (MAXIPIME) 2 g in sodium chloride 0.9 % 100 mL IVPB     2 g 200 mL/hr over 30 Minutes Intravenous  Once 12/19/18 2324 12/20/18 0053   12/19/18 2330  vancomycin (VANCOCIN) 2,000 mg in sodium chloride 0.9 % 500 mL IVPB     2,000 mg 250 mL/hr over 120 Minutes Intravenous  Once 12/19/18 2324 12/20/18 0407       Assessment/Plan ESRD DM ETOH  HTN HLD   Leukocytosis - WBC 38, Tmax 102, ?etiology colitis vs liver abscess Nausea/vomiting/diarrhea - n/v improved this AM. check C Diff Liver abscesses - IR consulted for aspiration vs drainage s/p lap appy for gangrenous appendicitis 12/06/18 Dr. Kieth Brightly - POD 14 - no abdominal pain on my exam - incisions c/d/i  - no indications for acute surgical intervention but we will follow  FEN -NPO, IVF VTE -SCDs ID -[email or web address] 1/14>>  LOS: 0 days    Brigid Re , [facility] 12/20/2018, 10:12 AM Pager: [phone] Consults: [phone] Mon-Fri 7:00  am-4:30 pm Sat-Sun 7:00 am-11:30 am

## 2018-12-20 NOTE — Progress Notes (Signed)
HD tx not well tolerated as pt signed off with 1hr 60mins remaining. Complains of nausea, vomiting, diarrhea throughout tx. MD notified. Pt stable for transfer post HD, denies pain, sob, dizziness.

## 2018-12-20 NOTE — Progress Notes (Signed)
Patient came from ED in stretcher, alert and oriented x 4, skin no open areas, Right BKA, bed in low position, call bell and phone within reach, safety plan reviewed, verbalized understanding, will continue monitoring.

## 2018-12-20 NOTE — H&P (Addendum)
[facility] Admission History and Physical Service Pager: [phone]  Patient name: [name] Medical record number: [id number] Date of birth: [birth year] Age: 53 y.o. Gender: female  Primary Care Provider: Rory Percy, DO Consultants: general surgery  Code Status: full  Chief Complaint: nausea, vomiting, diarrhea x 2 days  Assessment and Plan: [name] is a 53 y.o. female presenting with 2 days nausea, vomiting, diarrhea, likely 2/2 liver abscess. PMH is significant for ESRD, T2DM, HTN, HLD, BKA, blindness, alcohol dependency.  Sepsis Likely 2/2 Liver Abscess Patient presents with 2 days N/V/D starting after HD on 1/13.  Noted intermittent chills since that time, but never checked temperature.  Has not been able to eat.  Notes that her boyfriend saw blood on her shirt after she vomited.  Patient is 2 weeks s/p lap appy during which feculent material was poured into abdomen.  CT Abd/Pelvis showed multiloculated cystic lesions in inferior liver suggestive of hepatic abscess, post-op edema/inflammation in cecum and ascending colon.  Patient met sepsis criteria with temp on admission 101.1, tachycardia to 120, and tachypnea to 25.  BP has remained stable.  Has improved following gentle fluid administration in the ED, total of 500cc NS bolus. WBC 23.8, Lactic Acid 1.7.  Flu negative.  CXR no active pulmonary disease. INR 1.23, therefore liver function not compromised. AST 47 and ALT 25, mildly elevated from previous admission: 15 and 12, respectively.  Lipase mildly elevated to 60.  Blood cultures drawn and patient started on vanc, cefepime, and flagyl.  General surgery consulted in ED and note will consult IR in AM for possible liver abscess drainage.  Suspect that abscess may be 2/2 intraabdominal feculent material collection post-op.  Reassured as patient is currently without complaint.  No evidence of pneumonia on CXR, no sick contacts, no other symptoms.   No evidence of pancreatitis on CT and lipase not suggestive of acute pancreatitis. - admit to inpatient, Dr. Cindra Presume service - monitor vitals per floor protocol - cardiac monitoring - continuous pulse ox - general surgery consulted, appreciate recs - general surgery to consult IR in AM for possible drainage - AM CMP, CBC - LA normal, no need to trend further - f/u blood cultures - no further fluids at this time as is ESRD and vitals are improving - vancomycin (1/15- ) - cefepime (1/15- ) - flagyl (1/15- ) - tylenol prn pain - phenergan prn nausea, patient's QTc mildly prolonged on EKG to 470 - up with assistance - clear liquids, per surgery note - heparin for DVT PPx  Tachycardia Patient tachycardic to 120 on presentation.  EKG sinus tachycardia without significant change since previous.  Improved with fluids, now 107. - home metoprolol - cardiac monitoring  2 weeks S/P Laparoscopic appendectomy Patient with lap appy 1/1 during which feculent material was spilled into the abdomen.  Patient remained on IV Zosyn x 3 days following the procedure and was not given further antibiotics.  She had been doing well following surgery until N/V/D 2 days ago.  Surgical wound on abdomen with mild epidermal separation, but no sign of infection or dehiscence.  - general surgery consulted  Coffee-ground Emesis Patient's boyfriend stated that he saw blood on her shirt after emesis.  Patient is visually impaired and therefore unsure if she has seen bloody emesis.  Per ED, had some coffee-ground emesis in ED, FOBT in ED negative.  Hgb stable at 10.6.  Likely 2/2 iron infusions received at HD 1/13. Could also consider  increasing PPI to double dose if additional vomiting, Mallory Weiss tear also on differential.  - cont to monitor - hold heparin if further concern for bleeding  ESRD on HD Received HD 1/13.  Gets HD MWF. Cr 8.23 (baseline 6-7).  BUN 38, potassium and calcium WNL.  Takes dialyvite and  auryxia at home.  No need for urgent HD at present. - cont home dialyvite - cont home auryxia - CMP in AM - consult nephro in AM for HD  T2DM On humulin 70/30 BID.  Last A1c 10/17/18 7.7.  Glucose on admission 199. - CBG AC/HS - sSSI - holding home gabapentin  Hx Alcohol dependency  Notes no alcohol in over 6 years. - monitor on CIWA protocol  HTN Takes amlodipine, losartain, and lopressor 35m BID at home.  BP on admission 128/84.   - cont home BP meds as BP is normotensive   HLD Takes lipitor at home. - cont home lipitor, would hold if LFTs increase  Blindness Can only see shapes OD, - up with assistance  GERD  Takes protonix 240mQD at home. - cont home protonix  FEN/GI: clear liquid diet Prophylaxis: heparin subQ  Disposition: admit to telemetry, IR consult in AM for likely abscess drainage, continued broad spectrum antibiotics  History of Present Illness:  [name] BROSSARDs a [age].o. female presenting with 2 days nausea, vomiting, and diarrhea.  Notes that these symptoms started 2 days ago after her HD session.  Patient had laparoscopic appendectomy 1/1 and was recently discharged from the hospital.  She notes that she had been improving since discharge until 2 days ago.  Also stated that she had some central abdominal pain.  She has vomited numerous times in the last two days.  She states that she is unable to see her vomit due to her visual impairment, but her boyfriend saw some blood on her shirt after vomiting.  She has been having intermittent chills in the last two days as well, but has not checked her temperature.  No known sick contacts.  She has npot been able to eat due to nausea and vomiting.    In ED, patient noted to be febrile, tachycardic, and tachypneic.  She was given 500cc fluid bolus and improved.  She now states that she is without complaint and "I feel much better now."  Review Of Systems: Per HPI with the following additions:   Review of Systems   Constitutional: Positive for chills. Negative for fever.  HENT: Negative for congestion.   Eyes:       Blind at baseline  Respiratory: Negative for cough and shortness of breath.   Cardiovascular: Negative for chest pain and leg swelling.  Gastrointestinal: Positive for abdominal pain, diarrhea, nausea and vomiting.  Genitourinary: Negative for dysuria.  Neurological: Negative for focal weakness and headaches.    Patient Active Problem List   Diagnosis Date Noted  . Liver abscess 12/20/2018  . Acute right lower quadrant pain   . Appendicitis 12/06/2018  . Chronic cough 10/26/2017  . Healthcare maintenance 05/24/2017  . Diabetic peripheral neuropathy ([facility]04/11/2017  . Hyperlipidemia 03/17/2017  . S/P BKA (below knee amputation) unilateral, right ([facility]01/30/2018  . ESRD on dialysis ([facility]01/30/2018  . History of alcohol abuse 01/04/2017  . Legally blind 01/04/2017  . Type 2 diabetes mellitus ([facility]09/26/2012  . Hypertension 09/01/2011  . Anemia     Past Medical History: Past Medical History:  Diagnosis Date  . Alcohol dependency ([facility]  .  Anemia   . Chronic kidney disease   . Diabetes mellitus    type 2  . Diabetes mellitus type 2, uncontrolled ([place])   . Hypertension   . Legally blind in left eye, as defined in [place]   . Necrotizing fasciitis ([place])   . PONV (postoperative nausea and vomiting)     Past Surgical History: Past Surgical History:  Procedure Laterality Date  . AMPUTATION     right first and left middle finger amputation 2/2 OM 08/2011 by Dr. Amedeo Plenty  . AMPUTATION  01/18/2012   Procedure: AMPUTATION DIGIT;  Surgeon: Tennis Must, MD;  Location: [facility];  Service: Orthopedics;  Laterality: Left;  revision amputation left thumb  . AMPUTATION  08/25/2012   Procedure: AMPUTATION BELOW KNEE;  Surgeon: Newt Minion, MD;  Location: [facility];  Service: Orthopedics;  Laterality: Right;  Right Below Knee Amputation  . BASCILIC VEIN TRANSPOSITION  Left 07/22/2015   Procedure: BASILIC VEIN TRANSPOSITION ;  Surgeon: Elam Dutch, MD;  Location: [facility];  Service: Vascular;  Laterality: Left;  . CATARACT EXTRACTION     right   . CESAREAN SECTION  1986  . CESAREAN SECTION  1985  . CESAREAN SECTION  1991  . I&D EXTREMITY  12/24/2011   Procedure: IRRIGATION AND DEBRIDEMENT EXTREMITY;  Surgeon: Tennis Must, MD;  Location: [facility];  Service: Orthopedics;  Laterality: Left;  I&Dleft thumb with partial amputation  . I&D EXTREMITY  12/26/2011   Procedure: IRRIGATION AND DEBRIDEMENT EXTREMITY;  Surgeon: Tennis Must, MD;  Location: [place];  Service: Orthopedics;  Laterality: Left;  . I&D EXTREMITY  12/23/2011   Procedure: IRRIGATION AND DEBRIDEMENT EXTREMITY;  Surgeon: Tennis Must, MD;  Location: [place];  Service: Orthopedics;  Laterality: Left;  I&D Left thumb, hand and arm  . I&D left thumb  12/23/2011  . LAPAROSCOPIC APPENDECTOMY N/A 12/06/2018   Procedure: APPENDECTOMY LAPAROSCOPIC;  Surgeon: Kinsinger, Arta Bruce, MD;  Location: [facility];  Service: General;  Laterality: N/A;  . TUBAL LIGATION      Social History: Social History   Tobacco Use  . Smoking status: Never Smoker  . Smokeless tobacco: Never Used  Substance Use Topics  . Alcohol use: Yes    Alcohol/week: 1.0 standard drinks    Types: 1 Cans of beer per week    Comment: h/o alcohol abuse 4 years ago (07/21/15)  . Drug use: Yes    Types: Cocaine    Comment:  last used cocaine 4 years ago (07/21/15)   Additional social history: none  Please also refer to relevant sections of EMR.  Family History: Family History  Problem Relation Age of Onset  . Diabetes Mother   . Alcohol abuse Mother   . Diabetes Father   . Alzheimer's disease Father   . Hypertension Father   . Kidney failure Sister   . Diabetes Sister   . High blood pressure Sister      Allergies and Medications: No Known Allergies No current facility-administered medications on file prior to encounter.     Current Outpatient Medications on File Prior to Encounter  Medication Sig Dispense Refill  . amLODipine (NORVASC) 10 MG tablet TAKE 1 TABLET BY MOUTH ONCE DAILY 90 tablet 2  . atorvastatin (LIPITOR) 40 MG tablet TAKE 1 TABLET BY MOUTH ONCE DAILY 90 tablet 2  . AURYXIA 1 GM 210 MG(Fe) tablet TAKE 2 TABLETS BY MOUTH THREE TIMES A DAY WITH MEALS AND 1 TABLET TWICE A  DAY WITH SNACKS.   SWALLOW WHOLE, DO NOT CHEW OR CRUSH MEDICATION  6  . B Complex-C-Folic Acid (DIALYVITE 856) 0.8 MG TABS Take 1 tablet by mouth daily.  11  . Blood Glucose Monitoring Suppl (PRODIGY AUTOCODE BLOOD GLUCOSE) w/Device KIT 1 Device by Does not apply route 3 (three) times daily. 1 each 0  . gabapentin (NEURONTIN) 100 MG capsule TAKE 1 CAPSULE BY MOUTH ONCE DAILY AS NEEDED 90 capsule 0  . glucose blood test strip Use as instructed 100 each 12  . HUMULIN 70/30 KWIKPEN (70-30) 100 UNIT/ML PEN INJECT 10 UNITS SUBCUTANEOUSLY AT BREAKFAST AND AT SUPPER 1 pen 2  . Insulin Pen Needle (EASY TOUCH PEN NEEDLES) 32G X 4 MM MISC 1 each by Does not apply route 3 (three) times daily. 100 each 11  . Lancet Devices (ONE TOUCH DELICA LANCING DEV) MISC Check blood sugars once daily 1 each 0  . losartan (COZAAR) 50 MG tablet Take 1 tablet (50 mg total) by mouth daily. 90 tablet 3  . metoprolol tartrate (LOPRESSOR) 50 MG tablet TAKE 1 TABLET BY MOUTH TWICE DAILY 270 tablet 3  . mupirocin cream (BACTROBAN) 2 % Apply 1 application topically 2 (two) times daily. 15 g 0  . nortriptyline (PAMELOR) 25 MG capsule TAKE 1 CAPSULE BY MOUTH AT BEDTIME 90 capsule 0  . oxyCODONE (OXY IR/ROXICODONE) 5 MG immediate release tablet Take 0.5 tablets (2.5 mg total) by mouth every 4 (four) hours as needed for moderate pain or severe pain. 5 tablet 0  . pantoprazole (PROTONIX) 20 MG tablet Take 1 tablet (20 mg total) by mouth daily. 30 tablet 0  . polyethylene glycol (MIRALAX / GLYCOLAX) packet Take 17 g by mouth daily. 14 each 0  . PRODIGY LANCETS 28G MISC 1  each by Does not apply route 3 (three) times daily. 100 each 3    Objective: BP (!) 117/97   Pulse (!) 117   Temp (!) 102 F (38.9 C) (Oral)   Resp (!) 26   SpO2 91%   Physical Exam: General: 53 y.o. female in NAD, sitting up in bed HEENT: NCAT, MMM Neck: full ROM Cardio: RRR no m/r/g Lungs: CTAB, no wheezing, no rhonchi, no crackles, no increased work of breathing Abdomen: Soft, non-tender to palpation, positive bowel sounds, healing surgical wound superior to umbilicus with mild epidermal separation, no surrounding erythema Skin: warm and dry Extremities: No edema, right BKA, amputations of digits on b/l hands  Labs and Imaging: CBC BMET  Recent Labs  Lab 12/19/18 1932  WBC 23.8*  HGB 10.6*  HCT 35.1*  PLT 394   Recent Labs  Lab 12/19/18 1932  NA 139  K 3.8  CL 90*  CO2 24  BUN 38*  CREATININE 8.23*  GLUCOSE 199*  CALCIUM 8.9     Dg Chest 2 View  Result Date: 12/19/2018 CLINICAL DATA:  Nausea, vomiting, and diarrhea. Generalized body aches over the last day. History of hypertension and dialysis. EXAM: CHEST - 2 VIEW COMPARISON:  11/16/2017 FINDINGS: Mild cardiac enlargement. Linear atelectasis in the lung bases. No airspace disease or consolidation in the lungs. No blunting of costophrenic angles. No pneumothorax. Mediastinal contours appear intact. Degenerative changes in the shoulders. IMPRESSION: Mild cardiac enlargement. Linear atelectasis in the lung bases. No evidence of active pulmonary disease. Electronically Signed   By: Lucienne Capers M.D.   On: 12/19/2018 21:42   Ct Abdomen Pelvis W Contrast  Result Date: 12/19/2018 CLINICAL DATA:  Nausea, vomiting, and diarrhea.  Generalized body aches for 1 day. Dialysis for 3 years. Appendectomy on 12/06/2018. EXAM: CT ABDOMEN AND PELVIS WITH CONTRAST TECHNIQUE: Multidetector CT imaging of the abdomen and pelvis was performed using the standard protocol following bolus administration of intravenous contrast. CONTRAST:   176m OMNIPAQUE IOHEXOL 300 MG/ML  SOLN COMPARISON:  12/06/2018 FINDINGS: Lower chest: Linear atelectasis in the lung bases. Hepatobiliary: Lobulated circumscribed cystic lesions demonstrated in the inferior liver, new since previous study, suggesting hepatic abscess. Largest lesion measures about 3.7 cm diameter. Gallbladder and bile ducts are unremarkable. Pancreas: Unremarkable. No pancreatic ductal dilatation or surrounding inflammatory changes. Spleen: Normal in size without focal abnormality. Adrenals/Urinary Tract: Adrenal glands are unremarkable. Kidneys are normal, without renal calculi, focal lesion, or hydronephrosis. Bladder is unremarkable. Stomach/Bowel: Stomach, small bowel, and colon are mostly decompressed, limiting evaluation of the wall. In the right lower quadrant, there is evidence of infiltration in the right lower quadrant fat with some wall thickening suggested in the cecum and ascending colon. Minimal soft tissue edema without definite loculation. This may represent postoperative change or residual inflammatory changes from previous appendicitis. Recurrent inflammatory process could also be present. Vascular/Lymphatic: No significant vascular findings are present. No enlarged abdominal or pelvic lymph nodes. There a few scattered prominent lymph nodes in the right lower quadrant, likely reactive. Reproductive: Uterus and bilateral adnexa are unremarkable. Other: No free air in the abdomen. Peritoneal nodule along the right lower quadrant anteriorly measuring 1.4 cm diameter, not present previously. This could represent postoperative changes a suture granuloma or postoperative inflammatory reaction. Musculoskeletal: No acute or significant osseous findings. IMPRESSION: 1. Multiloculated cystic lesions demonstrated in the inferior liver, new since previous study, suggesting hepatic abscess. 2. Postoperative and/or inflammatory stranding and edema throughout the right lower quadrant fat with  associated colonic wall thickening in the cecum and ascending colon. 3. Peritoneal nodule along the right lower quadrant anteriorly may represent postoperative changes or postoperative inflammatory reaction. Electronically Signed   By: WLucienne CapersM.D.   On: 12/19/2018 22:44    Meccariello, BBernita Raisin DO 12/20/2018, 12:31 AM PGY-1, CSouth GlastonburyIntern pager: [phone] text pages welcome  FPTS Upper-Level Resident Addendum   I have independently interviewed and examined the patient. I have discussed the above with the original author and agree with their documentation. My edits for correction/addition/clarification are in blue. Please see also any attending notes.    KRalene Ok MD PGY-3, CMcKennaService pager: [phone](text pages welcome through [facility]

## 2018-12-20 NOTE — Consult Note (Addendum)
[facility] Renal Consultation Note    Indication for Consultation:  Management of ESRD/hemodialysis; anemia, hypertension/volume and secondary hyperparathyroidism  GLO:VFIEPPI, Bryson Ha, DO  HPI: [name] is a 53 y.o. female. ESRD 2/2 DM on HD MWF at [facility].  Past medical history significant for HTN, DMT2 with retinopathy, blindness in L eye, PVD s/p R BKA and finger amputations, and polysubstance abuse.  Patient was recently admitted from 1/1-1/4 due to gangrenous appendicitis s/p appendectomy.  Last dialysis was on 12/18/18, it was tolerated well and she left slightly under her EDW.    Seen and examined at bedside.  Presented to the ED due to chills and n/v/d that started Monday night and continued throughout the day Tuesday.  States she has been feeling well since d/c from the hospital until Monday.  Reports improvement in n/v today, but continues to have diarrhea.  Denies abdominal pain, SOB, CP, edema, weakness, and dizziness.  Pertinent findings include WBC 38.1, CT abd showing "multiloculated cystic lesions demonstrated in the inferior liver suggesting hepatic abscess" and "postop and/or inflammatory stranding/edema throughout RLQ fat with associated colonic wall thickening.", CXR with no active cardiopulmonary disease.  Patient has been admitted for further evaluation and treatment. Pt seen by me as she is signing off of HD due to N/V and diarrhea not controlled with PRNS   Past Medical History:  Diagnosis Date  . Alcohol dependency ([place])   . Anemia   . Chronic kidney disease   . Diabetes mellitus    type 2  . Diabetes mellitus type 2, uncontrolled ([place])   . Hypertension   . Legally blind in left eye, as defined in [place]   . Necrotizing fasciitis ([place])   . PONV (postoperative nausea and vomiting)    Past Surgical History:  Procedure Laterality Date  . AMPUTATION     right first and left middle finger amputation 2/2 OM 08/2011 by Dr. Amedeo Plenty  . AMPUTATION   01/18/2012   Procedure: AMPUTATION DIGIT;  Surgeon: Tennis Must, MD;  Location: [place];  Service: Orthopedics;  Laterality: Left;  revision amputation left thumb  . AMPUTATION  08/25/2012   Procedure: AMPUTATION BELOW KNEE;  Surgeon: Newt Minion, MD;  Location: [place];  Service: Orthopedics;  Laterality: Right;  Right Below Knee Amputation  . BASCILIC VEIN TRANSPOSITION Left 07/22/2015   Procedure: BASILIC VEIN TRANSPOSITION ;  Surgeon: Elam Dutch, MD;  Location: [place];  Service: Vascular;  Laterality: Left;  . CATARACT EXTRACTION     right   . CESAREAN SECTION  1986  . CESAREAN SECTION  1985  . CESAREAN SECTION  1991  . I&D EXTREMITY  12/24/2011   Procedure: IRRIGATION AND DEBRIDEMENT EXTREMITY;  Surgeon: Tennis Must, MD;  Location: [place];  Service: Orthopedics;  Laterality: Left;  I&Dleft thumb with partial amputation  . I&D EXTREMITY  12/26/2011   Procedure: IRRIGATION AND DEBRIDEMENT EXTREMITY;  Surgeon: Tennis Must, MD;  Location: [place];  Service: Orthopedics;  Laterality: Left;  . I&D EXTREMITY  12/23/2011   Procedure: IRRIGATION AND DEBRIDEMENT EXTREMITY;  Surgeon: Tennis Must, MD;  Location: [place];  Service: Orthopedics;  Laterality: Left;  I&D Left thumb, hand and arm  . I&D left thumb  12/23/2011  . LAPAROSCOPIC APPENDECTOMY N/A 12/06/2018   Procedure: APPENDECTOMY LAPAROSCOPIC;  Surgeon: Kinsinger, Arta Bruce, MD;  Location: [place];  Service: General;  Laterality: N/A;  . TUBAL LIGATION     Family History  Problem Relation Age of Onset  . Diabetes Mother   . Alcohol abuse Mother   . Diabetes Father   . Alzheimer's disease Father   . Hypertension Father   . Kidney failure Sister   . Diabetes Sister   . High blood pressure Sister    Social History:  reports that she has never smoked. She has never used smokeless tobacco. She reports current alcohol use of about 1.0 standard drinks of alcohol per week. She reports current drug use. Drug: Cocaine. No  Known Allergies Prior to Admission medications   Medication Sig Start Date End Date Taking? Authorizing Provider  amLODipine (NORVASC) 10 MG tablet TAKE 1 TABLET BY MOUTH ONCE DAILY Patient taking differently: 10 mg daily.  05/11/18  Yes Rory Percy, DO  atorvastatin (LIPITOR) 40 MG tablet TAKE 1 TABLET BY MOUTH ONCE DAILY Patient taking differently: Take 40 mg by mouth daily at 6 PM.  05/11/18  Yes Rumball, Bryson Ha, DO  AURYXIA 1 GM 210 MG(Fe) tablet Take 210-420 mg by mouth See admin instructions. Take 420 mg with meals and 210 mg with snacks 10/06/18  Yes [provider]  B Complex-C-Folic Acid (DIALYVITE 321) 0.8 MG TABS Take 1 tablet by mouth daily. 10/09/18  Yes [provider]  gabapentin (NEURONTIN) 100 MG capsule TAKE 1 CAPSULE BY MOUTH ONCE DAILY AS NEEDED Patient taking differently: Take 100 mg by mouth daily.  11/01/18  Yes Rumball, Alison, DO  HUMULIN 70/30 KWIKPEN (70-30) 100 UNIT/ML PEN INJECT 10 UNITS SUBCUTANEOUSLY AT BREAKFAST AND AT SUPPER Patient taking differently: Inject 5-10 Units into the skin 2 (two) times daily.  11/01/18  Yes Rory Percy, DO  losartan (COZAAR) 50 MG tablet Take 1 tablet (50 mg total) by mouth daily. 08/10/18  Yes Rory Percy, DO  metoprolol tartrate (LOPRESSOR) 50 MG tablet TAKE 1 TABLET BY MOUTH TWICE DAILY Patient taking differently: Take 50 mg by mouth 2 (two) times daily.  08/10/18  Yes Rory Percy, DO  mupirocin cream (BACTROBAN) 2 % Apply 1 application topically 2 (two) times daily. 10/26/17  Yes Mayo, Pete Pelt, MD  Blood Glucose Monitoring Suppl (PRODIGY AUTOCODE BLOOD GLUCOSE) w/Device KIT 1 Device by Does not apply route 3 (three) times daily. 10/26/18   Rory Percy, DO  glucose blood test strip Use as instructed 10/26/18   Rory Percy, DO  Insulin Pen Needle (EASY TOUCH PEN NEEDLES) 32G X 4 MM MISC 1 each by Does not apply route 3 (three) times daily. 08/10/18   Rory Percy, DO  Lancet Devices (ONE TOUCH  DELICA LANCING DEV) MISC Check blood sugars once daily 05/25/18   Mayo, Pete Pelt, MD  PRODIGY LANCETS 28G MISC 1 each by Does not apply route 3 (three) times daily. 10/26/18   Rory Percy, DO   Current Facility-Administered Medications  Medication Dose Route Frequency Provider Last Rate Last Dose  . 0.9 %  sodium chloride infusion  100 mL Intravenous PRN Penninger, Ria Comment, PA      . 0.9 %  sodium chloride infusion  100 mL Intravenous PRN Penninger, Ria Comment, PA      . acetaminophen (TYLENOL) tablet 650 mg  650 mg Oral Q6H PRN Meccariello, Bernita Raisin, DO       Or  . acetaminophen (TYLENOL) suppository 650 mg  650 mg Rectal Q6H PRN Meccariello, Bernita Raisin, DO      . amLODipine (NORVASC) tablet 10 mg  10 mg Oral Daily Meccariello, Bailey J, DO   10 mg at 12/20/18 1011  .  atorvastatin (LIPITOR) tablet 40 mg  40 mg Oral Daily Meccariello, Bailey J, DO   40 mg at 12/20/18 1011  . ceFEPIme (MAXIPIME) 1 g in sodium chloride 0.9 % 100 mL IVPB  1 g Intravenous Q24H Erenest Blank, RPH      . Chlorhexidine Gluconate Cloth 2 % PADS 6 each  6 each Topical Q0600 Penninger, Ria Comment, [place]      . ferric citrate (AURYXIA) tablet 420 mg  420 mg Oral TID WC Meccariello, Bernita Raisin, DO   420 mg at 12/20/18 1011  . heparin injection 1,000 Units  1,000 Units Dialysis PRN Penninger, Ria Comment, PA      . insulin aspart (novoLOG) injection 0-9 Units  0-9 Units Subcutaneous TID WC Meccariello, Bernita Raisin, DO   3 Units at 12/20/18 0831  . LORazepam (ATIVAN) tablet 1 mg  1 mg Oral Q6H PRN Meccariello, Bernita Raisin, DO       Or  . LORazepam (ATIVAN) injection 1 mg  1 mg Intravenous Q6H PRN Meccariello, Bernita Raisin, DO      . losartan (COZAAR) tablet 50 mg  50 mg Oral Daily Meccariello, Bailey J, DO   50 mg at 12/20/18 1011  . metoprolol tartrate (LOPRESSOR) tablet 50 mg  50 mg Oral BID Meccariello, Bailey J, DO   50 mg at 12/20/18 1011  . metroNIDAZOLE (FLAGYL) IVPB 500 mg  500 mg Intravenous Q8H Meccariello, Bernita Raisin, DO   Stopped at  12/20/18 0945  . multivitamin (RENA-VIT) tablet 1 tablet  1 tablet Oral QHS Alveda Reasons, MD      . pentafluoroprop-tetrafluoroeth Landry Dyke) aerosol 1 application  1 application Topical PRN Penninger, Ria Comment, [place]      . promethazine (PHENERGAN) tablet 12.5 mg  12.5 mg Oral Q6H PRN Meccariello, Bernita Raisin, DO      . vancomycin (VANCOCIN) IVPB 1000 mg/200 mL premix  1,000 mg Intravenous Q M,W,F-HD Erenest Blank, [facility]       Current Outpatient Medications  Medication Sig Dispense Refill  . amLODipine (NORVASC) 10 MG tablet TAKE 1 TABLET BY MOUTH ONCE DAILY (Patient taking differently: 10 mg daily. ) 90 tablet 2  . atorvastatin (LIPITOR) 40 MG tablet TAKE 1 TABLET BY MOUTH ONCE DAILY (Patient taking differently: Take 40 mg by mouth daily at 6 PM. ) 90 tablet 2  . AURYXIA 1 GM 210 MG(Fe) tablet Take 210-420 mg by mouth See admin instructions. Take 420 mg with meals and 210 mg with snacks  6  . B Complex-C-Folic Acid (DIALYVITE 315) 0.8 MG TABS Take 1 tablet by mouth daily.  11  . gabapentin (NEURONTIN) 100 MG capsule TAKE 1 CAPSULE BY MOUTH ONCE DAILY AS NEEDED (Patient taking differently: Take 100 mg by mouth daily. ) 90 capsule 0  . HUMULIN 70/30 KWIKPEN (70-30) 100 UNIT/ML PEN INJECT 10 UNITS SUBCUTANEOUSLY AT BREAKFAST AND AT SUPPER (Patient taking differently: Inject 5-10 Units into the skin 2 (two) times daily. ) 1 pen 2  . losartan (COZAAR) 50 MG tablet Take 1 tablet (50 mg total) by mouth daily. 90 tablet 3  . metoprolol tartrate (LOPRESSOR) 50 MG tablet TAKE 1 TABLET BY MOUTH TWICE DAILY (Patient taking differently: Take 50 mg by mouth 2 (two) times daily. ) 270 tablet 3  . mupirocin cream (BACTROBAN) 2 % Apply 1 application topically 2 (two) times daily. 15 g 0  . Blood Glucose Monitoring Suppl (PRODIGY AUTOCODE BLOOD GLUCOSE) w/Device KIT 1 Device by Does not apply route 3 (three) times  daily. 1 each 0  . glucose blood test strip Use as instructed 100 each 12  . Insulin Pen Needle (EASY  TOUCH PEN NEEDLES) 32G X 4 MM MISC 1 each by Does not apply route 3 (three) times daily. 100 each 11  . Lancet Devices (ONE TOUCH DELICA LANCING DEV) MISC Check blood sugars once daily 1 each 0  . PRODIGY LANCETS 28G MISC 1 each by Does not apply route 3 (three) times daily. 100 each 3   Labs: Basic Metabolic Panel: Recent Labs  Lab 12/19/18 1932 12/20/18 0630  NA 139 136  K 3.8 3.7  CL 90* 92*  CO2 24 27  GLUCOSE 199* 250*  BUN 38* 44*  CREATININE 8.23* 9.19*  CALCIUM 8.9 7.9*   Liver Function Tests: Recent Labs  Lab 12/19/18 1932 12/20/18 0630  AST 47* 78*  ALT 25 37  ALKPHOS 115 95  BILITOT 1.3* 0.8  PROT 8.2* 6.6  ALBUMIN 2.8* 2.2*   Recent Labs  Lab 12/19/18 1932  LIPASE 60*   CBC: Recent Labs  Lab 12/19/18 1932 12/20/18 0630  WBC 23.8* 38.1*  HGB 10.6* 8.6*  HCT 35.1* 28.8*  MCV 75.6* 75.0*  PLT 394 391   CBG: Recent Labs  Lab 12/20/18 0813  GLUCAP 246*   Studies/Results: Dg Chest 2 View  Result Date: 12/19/2018 CLINICAL DATA:  Nausea, vomiting, and diarrhea. Generalized body aches over the last day. History of hypertension and dialysis. EXAM: CHEST - 2 VIEW COMPARISON:  11/16/2017 FINDINGS: Mild cardiac enlargement. Linear atelectasis in the lung bases. No airspace disease or consolidation in the lungs. No blunting of costophrenic angles. No pneumothorax. Mediastinal contours appear intact. Degenerative changes in the shoulders. IMPRESSION: Mild cardiac enlargement. Linear atelectasis in the lung bases. No evidence of active pulmonary disease. Electronically Signed   By: Lucienne Capers M.D.   On: 12/19/2018 21:42   Ct Abdomen Pelvis W Contrast  Result Date: 12/19/2018 CLINICAL DATA:  Nausea, vomiting, and diarrhea. Generalized body aches for 1 day. Dialysis for 3 years. Appendectomy on 12/06/2018. EXAM: CT ABDOMEN AND PELVIS WITH CONTRAST TECHNIQUE: Multidetector CT imaging of the abdomen and pelvis was performed using the standard protocol  following bolus administration of intravenous contrast. CONTRAST:  130m OMNIPAQUE IOHEXOL 300 MG/ML  SOLN COMPARISON:  12/06/2018 FINDINGS: Lower chest: Linear atelectasis in the lung bases. Hepatobiliary: Lobulated circumscribed cystic lesions demonstrated in the inferior liver, new since previous study, suggesting hepatic abscess. Largest lesion measures about 3.7 cm diameter. Gallbladder and bile ducts are unremarkable. Pancreas: Unremarkable. No pancreatic ductal dilatation or surrounding inflammatory changes. Spleen: Normal in size without focal abnormality. Adrenals/Urinary Tract: Adrenal glands are unremarkable. Kidneys are normal, without renal calculi, focal lesion, or hydronephrosis. Bladder is unremarkable. Stomach/Bowel: Stomach, small bowel, and colon are mostly decompressed, limiting evaluation of the wall. In the right lower quadrant, there is evidence of infiltration in the right lower quadrant fat with some wall thickening suggested in the cecum and ascending colon. Minimal soft tissue edema without definite loculation. This may represent postoperative change or residual inflammatory changes from previous appendicitis. Recurrent inflammatory process could also be present. Vascular/Lymphatic: No significant vascular findings are present. No enlarged abdominal or pelvic lymph nodes. There a few scattered prominent lymph nodes in the right lower quadrant, likely reactive. Reproductive: Uterus and bilateral adnexa are unremarkable. Other: No free air in the abdomen. Peritoneal nodule along the right lower quadrant anteriorly measuring 1.4 cm diameter, not present previously. This could represent postoperative changes a suture  granuloma or postoperative inflammatory reaction. Musculoskeletal: No acute or significant osseous findings. IMPRESSION: 1. Multiloculated cystic lesions demonstrated in the inferior liver, new since previous study, suggesting hepatic abscess. 2. Postoperative and/or inflammatory  stranding and edema throughout the right lower quadrant fat with associated colonic wall thickening in the cecum and ascending colon. 3. Peritoneal nodule along the right lower quadrant anteriorly may represent postoperative changes or postoperative inflammatory reaction. Electronically Signed   By: Lucienne Capers M.D.   On: 12/19/2018 22:44    ROS: All others negative except those listed in HPI.  Physical Exam: Vitals:   12/20/18 0930 12/20/18 1000 12/20/18 1100 12/20/18 1115  BP: 112/63 107/63 109/61   Pulse: 92 88  81  Resp:    (!) 21  Temp:      TempSrc:      SpO2: 97% 97% 97% 97%     General: NAD, well appearing female, sitting in bed Head: NCAT, sclera not icteric, L eye blindness, MMM Neck: Supple. No lymphadenopathy. No JVD Lungs: CTA bilaterally. No wheeze, rales or rhonchi. Breathing is unlabored. Heart: RRR. No murmur, rubs or gallops.  Abdomen: soft, nontender, +BS, no guarding, no rebound tenderness. Appendectomy scars healing Lower extremities:no edema b/l, L BKA Neuro: AAOx3. Moves all extremities spontaneously. Psych:  Responds to questions appropriately with a normal affect. Dialysis Access: LU AVF +b/t  Dialysis Orders:  MWF - [place]  3.75hrs, BFR 350, DFR AF 1.5,  EDW 90.5kg, 2K/ 2Ca  Access: LU AVF  Heparin 4500 units Mircera 100 mcg q2wks - last 11/26/18 Calcitriol 1 mcg PO qHD  Assessment/Plan: 1.  Liver Abscess - WBC elevated. Abscess identified on CT.  On ABX.  Surgery and IR consulting.  To have abscess drained by IR. Meropenam and vanc  2. N/v/d - n/v improved.  WBC elevated.  Checking for C Diff. Per primary- PRN's 3.  ESRD -  On HD MWF.  Orders written for HD today via AVF, per regular schedule. K 3.7. 4.  Hypertension/volume  - BP in goal. Continue home meds.  Does not appear volume overloaded on exam.  Plan for HD with UF goal 2-3L as tolerated.  5.  Anemia of CKD - Hgb 8.6.  Will dose aranesp w/HD today.  6.  Secondary Hyperparathyroidism -  CCa  in goal. Will check phos- was 6.7. Continue binders although I am not sure how much is eating, VDRA. 7.  Nutrition - Renal diet w/fluid restrictions. Protein supplements. Renal vitamins. 8. DMT2 - per primary   Jen Mow, PA-C [facility] Pager: [phone] 12/20/2018, 12:05 PM   Patient seen and examined, agree with above note with above modifications. Pt known to [place] with ESRD- had not missed any treatments- had gangranous appendicitis 2 weeks ago - now presenting with abd pain/N/V- imaging showing liver abscesses- possibly a complication of previous infection.  Surgery involved - on IV abx- considering drain- will perform routine HD while here and continue with  Dialysis related meds   Corliss Parish, MD 12/20/2018

## 2018-12-21 ENCOUNTER — Inpatient Hospital Stay (HOSPITAL_COMMUNITY): Payer: Medicare Other

## 2018-12-21 DIAGNOSIS — E440 Moderate protein-calorie malnutrition: Secondary | ICD-10-CM

## 2018-12-21 DIAGNOSIS — N186 End stage renal disease: Secondary | ICD-10-CM

## 2018-12-21 DIAGNOSIS — Z992 Dependence on renal dialysis: Secondary | ICD-10-CM

## 2018-12-21 LAB — COMPREHENSIVE METABOLIC PANEL
ALT: 39 U/L (ref 0–44)
AST: 77 U/L — ABNORMAL HIGH (ref 15–41)
Albumin: 2 g/dL — ABNORMAL LOW (ref 3.5–5.0)
Alkaline Phosphatase: 87 U/L (ref 38–126)
Anion gap: 18 — ABNORMAL HIGH (ref 5–15)
BUN: 34 mg/dL — ABNORMAL HIGH (ref 6–20)
CO2: 22 mmol/L (ref 22–32)
Calcium: 8 mg/dL — ABNORMAL LOW (ref 8.9–10.3)
Chloride: 99 mmol/L (ref 98–111)
Creatinine, Ser: 7.76 mg/dL — ABNORMAL HIGH (ref 0.44–1.00)
GFR calc Af Amer: 6 mL/min — ABNORMAL LOW (ref >60)
GFR calc non Af Amer: 5 mL/min — ABNORMAL LOW (ref >60)
Glucose, Bld: 153 mg/dL — ABNORMAL HIGH (ref 70–99)
Potassium: 3.4 mmol/L — ABNORMAL LOW (ref 3.5–5.1)
Sodium: 139 mmol/L (ref 135–145)
Total Bilirubin: 1.4 mg/dL — ABNORMAL HIGH (ref 0.3–1.2)
Total Protein: 6.2 g/dL — ABNORMAL LOW (ref 6.5–8.1)

## 2018-12-21 LAB — GLUCOSE, CAPILLARY
Glucose-Capillary: 130 mg/dL — ABNORMAL HIGH (ref 70–99)
Glucose-Capillary: 146 mg/dL — ABNORMAL HIGH (ref 70–99)
Glucose-Capillary: 188 mg/dL — ABNORMAL HIGH (ref 70–99)
Glucose-Capillary: 218 mg/dL — ABNORMAL HIGH (ref 70–99)
Glucose-Capillary: 212 mg/dL — ABNORMAL HIGH (ref 70–99)

## 2018-12-21 LAB — CBC
HCT: 26.8 % — ABNORMAL LOW (ref 36.0–46.0)
Hemoglobin: 8.1 g/dL — ABNORMAL LOW (ref 12.0–15.0)
MCH: 22.4 pg — ABNORMAL LOW (ref 26.0–34.0)
MCHC: 30.2 g/dL (ref 30.0–36.0)
MCV: 74.2 fL — ABNORMAL LOW (ref 80.0–100.0)
Platelets: 322 10*3/uL (ref 150–400)
RBC: 3.61 MIL/uL — ABNORMAL LOW (ref 3.87–5.11)
RDW: 17.8 % — ABNORMAL HIGH (ref 11.5–15.5)
WBC: 23.8 10*3/uL — ABNORMAL HIGH (ref 4.0–10.5)
nRBC: 0 % (ref 0.0–0.2)

## 2018-12-21 LAB — C DIFFICILE QUICK SCREEN W PCR REFLEX
C Diff antigen: NEGATIVE
C Diff interpretation: NOT DETECTED
C Diff toxin: NEGATIVE

## 2018-12-21 MED ORDER — FENTANYL CITRATE (PF) 100 MCG/2ML IJ SOLN
INTRAMUSCULAR | Status: AC | PRN
  Administered 2018-12-21: 50 ug via INTRAVENOUS

## 2018-12-21 MED ORDER — LIDOCAINE HCL 1 % IJ SOLN
INTRAMUSCULAR | Status: AC
  Filled 2018-12-21: qty 20

## 2018-12-21 MED ORDER — DARBEPOETIN ALFA 100 MCG/0.5ML IJ SOSY
100.0000 ug | PREFILLED_SYRINGE | INTRAMUSCULAR | Status: DC
  Administered 2018-12-22: 100 ug via INTRAVENOUS
  Filled 2018-12-21: qty 0.5

## 2018-12-21 MED ORDER — CHLORHEXIDINE GLUCONATE CLOTH 2 % EX PADS
6.0000 | MEDICATED_PAD | Freq: Every day | CUTANEOUS | Status: DC
  Administered 2018-12-22: 6 via TOPICAL

## 2018-12-21 MED ORDER — MIDAZOLAM HCL 2 MG/2ML IJ SOLN
INTRAMUSCULAR | Status: AC
  Filled 2018-12-21: qty 2

## 2018-12-21 MED ORDER — SODIUM CHLORIDE 0.9% FLUSH
5.0000 mL | Freq: Three times a day (TID) | INTRAVENOUS | Status: DC
  Administered 2018-12-21 – 2018-12-22 (×4): 5 mL
  Administered 2018-12-22: 0.5 mL
  Administered 2018-12-23 – 2018-12-26 (×10): 5 mL

## 2018-12-21 MED ORDER — FENTANYL CITRATE (PF) 100 MCG/2ML IJ SOLN
INTRAMUSCULAR | Status: AC
  Filled 2018-12-21: qty 2

## 2018-12-21 MED ORDER — MIDAZOLAM HCL 2 MG/2ML IJ SOLN
INTRAMUSCULAR | Status: AC | PRN
  Administered 2018-12-21: 1 mg via INTRAVENOUS

## 2018-12-21 MED ORDER — PROMETHAZINE HCL 25 MG/ML IJ SOLN
12.5000 mg | Freq: Four times a day (QID) | INTRAMUSCULAR | Status: DC | PRN
  Administered 2018-12-21 – 2018-12-26 (×13): 12.5 mg via INTRAVENOUS
  Filled 2018-12-21 (×13): qty 1

## 2018-12-21 MED ORDER — NEPRO/CARBSTEADY PO LIQD
237.0000 mL | Freq: Two times a day (BID) | ORAL | Status: DC
  Filled 2018-12-21 (×10): qty 237

## 2018-12-21 MED ORDER — ONDANSETRON HCL 4 MG/2ML IJ SOLN
INTRAMUSCULAR | Status: AC
  Filled 2018-12-21: qty 2

## 2018-12-21 MED ORDER — ONDANSETRON HCL 4 MG/2ML IJ SOLN
INTRAMUSCULAR | Status: AC | PRN
  Administered 2018-12-21: 4 mg via INTRAVENOUS

## 2018-12-21 NOTE — Progress Notes (Addendum)
Initial Nutrition Assessment  DOCUMENTATION CODES:   Obesity unspecified  INTERVENTION:   Nepro Shake po BID, each supplement provides 425 kcal and 19 grams protein  NUTRITION DIAGNOSIS:   Inadequate oral intake related to vomiting, nausea as evidenced by per patient/family report.  GOAL:   Patient will meet greater than or equal to 90% of their needs  MONITOR:   PO intake, Supplement acceptance, Labs, Weight trends  REASON FOR ASSESSMENT:   Malnutrition Screening Tool    ASSESSMENT:   53 y.o female with PMH: ESRD on HD, T2DM, HTN, blindness, alcohol dependency, s/p appendectomy. Admitted with N/V and diarrhea, sepsis likely due to liver abscess on CT.    1/13 left dialysis slightly under her EDW 1/15 HD stopped early due to N/V 1/16 IR drained hepatic abscess and yields 20 ml foul smelling fluid  Pt alert and oriented when DI entered the room with family in room. Pt didn't fell well and kept putting her hand over her mouth. Pt reported that she felt better today and was able to eat an orange sherbert. She reported that she usually is a good eater at home, eats three meals a day and has previously had a great appetite until her appendectomy.   DI encouraged the importance of ordering and eating meals that sound good to her. Pt reported that last admission she received Nepro in [place] and liked those supplements.   Pt reported that her EDW is 90.5 kg at dialysis. Pt knows that she has lost weight since her sickness but unsure the amount. Current weight 88 kg. 2.5 kg below EDW.   UF: 303 ml I/O: Intake 543 ml, Output 323 ml  Medications reviewed and include: Calcitriol Ferric Citrate Sliding scale Insulin Cozaar  Rena-Vit  Normal Saline as needed Maxipime Flagyl  Labs reviewed: K 3.4 (L)  BUN 34 (H)  Creatinine 7.76 (H)  Phos 6.7 (H)  CBGS: 182, 178, 130, 146, 188 X 18 hrs  NUTRITION - FOCUSED PHYSICAL EXAM:    Most Recent Value  Orbital Region  No  depletion  Upper Arm Region  No depletion  Thoracic and Lumbar Region  No depletion  Buccal Region  No depletion  Temple Region  No depletion  Clavicle Bone Region  No depletion  Clavicle and Acromion Bone Region  No depletion  Scapular Bone Region  No depletion  Dorsal Hand  No depletion  Patellar Region  No depletion  Anterior Thigh Region  No depletion  Posterior Calf Region  No depletion  Edema (RD Assessment)  None  Hair  Unable to assess Charma Igo net]  Eyes  Reviewed  Mouth  Reviewed  Skin  Reviewed  Nails  Reviewed       Diet Order:   Diet Order            Diet regular Room service appropriate? Yes; Fluid consistency: Thin  Diet effective now              EDUCATION NEEDS:   Not appropriate for education at this time  Skin:  Skin Assessment: Reviewed RN Assessment  Last BM:  1/16-Type 7  Height:   Ht Readings from Last 1 Encounters:  12/21/18 5\' 7"  (1.702 m)    Weight:   Wt Readings from Last 1 Encounters:  12/21/18 88 kg    Ideal Body Weight:  61 kg  BMI:  Body mass index is 30.39 kg/m.  Estimated Nutritional Needs:   Kcal:  2000-2200 kcal/d  Protein:  85-95  g/d  Fluid:  1,000 ml + UOP  Mauricia Area, MS, Dietetic Intern Pager: [phone] After hours Pager: [phone]

## 2018-12-21 NOTE — Progress Notes (Signed)
[facility] Progress Note     Subjective: CC: diarrhea Patient still having diarrhea but sample not sent for C. Diff. Wants to know if she can have something to slow down diarrhea. Tolerating diet. Some nausea but no further vomiting and improved with antiemetic. Denies abdominal pain.   Objective: Vital signs in last 24 hours: Temp:  [98.2 F (36.8 C)-100.1 F (37.8 C)] 98.7 F (37.1 C) (01/16 0959) Pulse Rate:  [66-102] 94 (01/16 0959) Resp:  [15-21] 18 (01/16 0959) BP: (80-138)/(48-95) 136/62 (01/16 0959) SpO2:  [93 %-100 %] 97 % (01/16 0959) Weight:  [88 kg] 88 kg (01/16 0351) Last BM Date: 12/20/18  Intake/Output from previous day: 01/15 0701 - 01/16 0700 In: 656.7 [I.V.:443.4; IV Piggyback:213.3] Out: 303  Intake/Output this shift: Total I/O In: -  Out: 20 [Drains:20]  PE: Gen:  Alert, NAD, pleasant Card:  Regular rate and rhythm Pulm:  Normal effort, clear to auscultation bilaterally Abd: Soft, non-tender, non-distended, bowel sounds present, incisions c/d/i; drain present in RUQ with bloody fluid  Lab Results:  Recent Labs    12/20/18 0630 12/21/18 0714  WBC 38.1* 23.8*  HGB 8.6* 8.1*  HCT 28.8* 26.8*  PLT 391 322   BMET Recent Labs    12/20/18 0630 12/21/18 0714  NA 136 139  K 3.7 3.4*  CL 92* 99  CO2 27 22  GLUCOSE 250* 153*  BUN 44* 34*  CREATININE 9.19* 7.76*  CALCIUM 7.9* 8.0*   PT/INR Recent Labs    12/20/18 0019  LABPROT 15.3*  INR 1.23   CMP     Component Value Date/Time   NA 139 12/21/2018 0714   NA 139 09/12/2013 0736   K 3.4 (L) 12/21/2018 0714   K 5.2 (H) 11/07/2013 0802   CL 99 12/21/2018 0714   CL 111 (H) 09/12/2013 0736   CO2 22 12/21/2018 0714   CO2 21 09/12/2013 0736   GLUCOSE 153 (H) 12/21/2018 0714   GLUCOSE 181 (H) 09/12/2013 0736   BUN 34 (H) 12/21/2018 0714   BUN 35 (H) 09/12/2013 0736   CREATININE 7.76 (H) 12/21/2018 0714   CREATININE 1.55 (H) 09/12/2013 0736   CREATININE 0.94 09/12/2012  1113   CALCIUM 8.0 (L) 12/21/2018 0714   CALCIUM 7.6 (L) 03/31/2016 1101   PROT 6.2 (L) 12/21/2018 0714   ALBUMIN 2.0 (L) 12/21/2018 0714   AST 77 (H) 12/21/2018 0714   ALT 39 12/21/2018 0714   ALKPHOS 87 12/21/2018 0714   BILITOT 1.4 (H) 12/21/2018 0714   GFRNONAA 5 (L) 12/21/2018 0714   GFRNONAA 40 (L) 09/12/2013 0736   GFRNONAA 74 09/12/2012 1113   GFRAA 6 (L) 12/21/2018 0714   GFRAA 46 (L) 09/12/2013 0736   GFRAA 85 09/12/2012 1113   Lipase     Component Value Date/Time   LIPASE 60 (H) 12/19/2018 1932       Studies/Results: Dg Chest 2 View  Result Date: 12/19/2018 CLINICAL DATA:  Nausea, vomiting, and diarrhea. Generalized body aches over the last day. History of hypertension and dialysis. EXAM: CHEST - 2 VIEW COMPARISON:  11/16/2017 FINDINGS: Mild cardiac enlargement. Linear atelectasis in the lung bases. No airspace disease or consolidation in the lungs. No blunting of costophrenic angles. No pneumothorax. Mediastinal contours appear intact. Degenerative changes in the shoulders. IMPRESSION: Mild cardiac enlargement. Linear atelectasis in the lung bases. No evidence of active pulmonary disease. Electronically Signed   By: Lucienne Capers M.D.   On: 12/19/2018 21:42   Ct Abdomen Pelvis  W Contrast  Result Date: 12/19/2018 CLINICAL DATA:  Nausea, vomiting, and diarrhea. Generalized body aches for 1 day. Dialysis for 3 years. Appendectomy on 12/06/2018. EXAM: CT ABDOMEN AND PELVIS WITH CONTRAST TECHNIQUE: Multidetector CT imaging of the abdomen and pelvis was performed using the standard protocol following bolus administration of intravenous contrast. CONTRAST:  154mL OMNIPAQUE IOHEXOL 300 MG/ML  SOLN COMPARISON:  12/06/2018 FINDINGS: Lower chest: Linear atelectasis in the lung bases. Hepatobiliary: Lobulated circumscribed cystic lesions demonstrated in the inferior liver, new since previous study, suggesting hepatic abscess. Largest lesion measures about 3.7 cm diameter.  Gallbladder and bile ducts are unremarkable. Pancreas: Unremarkable. No pancreatic ductal dilatation or surrounding inflammatory changes. Spleen: Normal in size without focal abnormality. Adrenals/Urinary Tract: Adrenal glands are unremarkable. Kidneys are normal, without renal calculi, focal lesion, or hydronephrosis. Bladder is unremarkable. Stomach/Bowel: Stomach, small bowel, and colon are mostly decompressed, limiting evaluation of the wall. In the right lower quadrant, there is evidence of infiltration in the right lower quadrant fat with some wall thickening suggested in the cecum and ascending colon. Minimal soft tissue edema without definite loculation. This may represent postoperative change or residual inflammatory changes from previous appendicitis. Recurrent inflammatory process could also be present. Vascular/Lymphatic: No significant vascular findings are present. No enlarged abdominal or pelvic lymph nodes. There a few scattered prominent lymph nodes in the right lower quadrant, likely reactive. Reproductive: Uterus and bilateral adnexa are unremarkable. Other: No free air in the abdomen. Peritoneal nodule along the right lower quadrant anteriorly measuring 1.4 cm diameter, not present previously. This could represent postoperative changes a suture granuloma or postoperative inflammatory reaction. Musculoskeletal: No acute or significant osseous findings. IMPRESSION: 1. Multiloculated cystic lesions demonstrated in the inferior liver, new since previous study, suggesting hepatic abscess. 2. Postoperative and/or inflammatory stranding and edema throughout the right lower quadrant fat with associated colonic wall thickening in the cecum and ascending colon. 3. Peritoneal nodule along the right lower quadrant anteriorly may represent postoperative changes or postoperative inflammatory reaction. Electronically Signed   By: Lucienne Capers M.D.   On: 12/19/2018 22:44   Ct Image Guided Drainage By  Percutaneous Catheter  Result Date: 12/21/2018 INDICATION: 53 year old female with a history of gangrenous acute appendicitis status post appendectomy. Recent CT imaging demonstrates and intrahepatic abscess with a small stone adjacent to the liver. The stone was not present on prior imaging and almost certainly represents a retained appendicolith which likely represents the source of the patient's hepatic abscess. She presents for CT-guided drainage of the abscess cavity. EXAM: CT-guided drain placement MEDICATIONS: The patient is currently admitted to the hospital and receiving intravenous antibiotics. The antibiotics were administered within an appropriate time frame prior to the initiation of the procedure. Additionally, 4 mg Zofran were administered intravenously for nausea. ANESTHESIA/SEDATION: Fentanyl 1 mcg IV; Versed 50 mg IV. Moderate Sedation Time:  21 minutes The patient was continuously monitored during the procedure by the interventional radiology nurse under my direct supervision. COMPLICATIONS: None immediate. PROCEDURE: Informed written consent was obtained from the patient after a thorough discussion of the procedural risks, benefits and alternatives. All questions were addressed. Maximal Sterile Barrier Technique was utilized including caps, mask, sterile gowns, sterile gloves, sterile drape, hand hygiene and skin antiseptic. A timeout was performed prior to the initiation of the procedure. A planning CT scan was performed. The low-attenuation collection in the inferior aspect of hepatic segment 6 is easily visualized. The small appendicoliths is again visualized. A suitable skin entry site was selected and marked.  After the skin was sterilely prepped and draped in the standard fashion using chlorhexidine skin prep, local anesthesia was attained by infiltration with 1% lidocaine. A small dermatotomy was made. Under intermittent CT guidance, an 18 gauge trocar needle was advanced through the  liver and into the fluid collection. A 0.035 wire was then coiled in the complex collection. The needle was removed. The skin and transhepatic tract were dilated to 10 [place] and a Cook 10.2 [place] all-purpose drainage catheter was advanced over the wire and formed. Aspiration yields approximately 20 cc of foul-smelling thick, purulent fluid. The aspirated fluid was sent for Gram stain and culture. Follow-up CT imaging demonstrates excellent position of the drainage catheter. The catheter was flushed, connected to JP bulb suction and secured to the skin with 0 Prolene suture. The patient tolerated the procedure well. IMPRESSION: Successful placement of a 10 French transhepatic drainage catheter into the inferior right hepatic abscess. Aspiration yields 20 mL of thick, foul-smelling purulent fluid. Samples were sent for culture. PLAN: 1. Maintain drain to JP bulb suction. 2. Flush drain at least once per shift. 3. Follow cultures and adjust antibiotics accordingly. 4. Surgery aware of the presence of the retained appendicoliths. This will represent a continued nidus for infection/abscess formation until removed. Electronically Signed   By: Jacqulynn Cadet M.D.   On: 12/21/2018 10:15    Anti-infectives: Anti-infectives (From admission, onward)   Start     Dose/Rate Route Frequency Ordered Stop   12/20/18 2200  ceFEPIme (MAXIPIME) 1 g in sodium chloride 0.9 % 100 mL IVPB     1 g 200 mL/hr over 30 Minutes Intravenous Every 24 hours 12/20/18 0126     12/20/18 1200  vancomycin (VANCOCIN) IVPB 1000 mg/200 mL premix     1,000 mg 200 mL/hr over 60 Minutes Intravenous Every M-W-F (Hemodialysis) 12/20/18 0126     12/19/18 2330  metroNIDAZOLE (FLAGYL) IVPB 500 mg     500 mg 100 mL/hr over 60 Minutes Intravenous Every 8 hours 12/19/18 2320     12/19/18 2330  ceFEPIme (MAXIPIME) 2 g in sodium chloride 0.9 % 100 mL IVPB     2 g 200 mL/hr over 30 Minutes Intravenous  Once 12/19/18 2324 12/20/18 0053   12/19/18  2330  vancomycin (VANCOCIN) 2,000 mg in sodium chloride 0.9 % 500 mL IVPB     2,000 mg 250 mL/hr over 120 Minutes Intravenous  Once 12/19/18 2324 12/20/18 0407       Assessment/Plan ESRD DM ETOH  HTN HLD   Leukocytosis - WBC 23.8 from 38, Tmax 100.1 Nausea/vomiting/diarrhea - n/v improving. check C Diff Liver abscesses - s/p IR drain placement, 20 cc foul smelling purulence aspirated  s/p lap appy for gangrenous appendicitis 12/06/18 Dr. Kieth Brightly - POD 15 - no abdominal pain on my exam - incisions c/d/i  - discussed with Dr Laurence Ferrari, appendicolith seen on CT behind liver but not commented on original report - will discuss further with attending provider  FEN - reg VTE -SCDs ID -[email or web address] 1/14>>  LOS: 1 day    Brigid Re , [facility] 12/21/2018, 10:27 AM Pager: [phone] Consults: [phone] Mon-Fri 7:00 am-4:30 pm Sat-Sun 7:00 am-11:30 am

## 2018-12-21 NOTE — Progress Notes (Addendum)
[facility] Progress Note   Subjective:   Seen and examined at bedside post procedure.  Reports fever, chills, nausea and diarrhea this morning.  Actively vomiting clear liquids at the end of my exam.  States she signed off HD early yesterday due to symptoms.  Denies SOB, CP and edema. S/p drain per IR- surg thinks she will eventually need surgery   Objective Vitals:   12/21/18 0925 12/21/18 0930 12/21/18 0935 12/21/18 0959  BP: 124/63 (!) 121/57 121/60 136/62  Pulse: 92 91 92 94  Resp:   18 18  Temp:    98.7 F (37.1 C)  TempSrc:    Oral  SpO2: 100% 97% 97% 97%  Weight:      Height:       Physical Exam General:NAD, ill appearing female, sitting up in bed Heart:RRR Lungs:CTAB Abdomen:soft, NTND, drain in R flank, dressed c/d/i with bloody fluid Extremities: no LE edema, R BKA  Dialysis Access: LU AVF   Filed Weights   12/21/18 0351  Weight: 88 kg    Intake/Output Summary (Last 24 hours) at 12/21/2018 1047 Last data filed at 12/21/2018 0927 Gross per 24 hour  Intake 543.39 ml  Output 323 ml  Net 220.39 ml    Additional Objective Labs: Basic Metabolic Panel: Recent Labs  Lab 12/19/18 1932 12/20/18 0630 12/20/18 1330 12/21/18 0714  NA 139 136  --  139  K 3.8 3.7  --  3.4*  CL 90* 92*  --  99  CO2 24 27  --  22  GLUCOSE 199* 250*  --  153*  BUN 38* 44*  --  34*  CREATININE 8.23* 9.19*  --  7.76*  CALCIUM 8.9 7.9*  --  8.0*  PHOS  --   --  6.7*  --    Liver Function Tests: Recent Labs  Lab 12/19/18 1932 12/20/18 0630 12/21/18 0714  AST 47* 78* 77*  ALT 25 37 39  ALKPHOS 115 95 87  BILITOT 1.3* 0.8 1.4*  PROT 8.2* 6.6 6.2*  ALBUMIN 2.8* 2.2* 2.0*   Recent Labs  Lab 12/19/18 1932  LIPASE 60*   CBC: Recent Labs  Lab 12/19/18 1932 12/20/18 0630 12/21/18 0714  WBC 23.8* 38.1* 23.8*  HGB 10.6* 8.6* 8.1*  HCT 35.1* 28.8* 26.8*  MCV 75.6* 75.0* 74.2*  PLT 394 391 322   Blood Culture    Component Value Date/Time   SDES BLOOD  RIGHT ARM 12/20/2018 0021   SPECREQUEST  12/20/2018 0021    BOTTLES DRAWN AEROBIC AND ANAEROBIC Blood Culture adequate volume   CULT (A) 12/20/2018 0021    ENTEROBACTER CLOACAE SUSCEPTIBILITIES TO FOLLOW Performed at [facility], [address], [place], [place] [address]    REPTSTATUS PENDING 12/20/2018 0021   CBG: Recent Labs  Lab 12/20/18 1253 12/20/18 1811 12/20/18 2213 12/21/18 0659 12/21/18 0744  GLUCAP 165* 182* 178* 130* 146*   Studies/Results: Dg Chest 2 View  Result Date: 12/19/2018 CLINICAL DATA:  Nausea, vomiting, and diarrhea. Generalized body aches over the last day. History of hypertension and dialysis. EXAM: CHEST - 2 VIEW COMPARISON:  11/16/2017 FINDINGS: Mild cardiac enlargement. Linear atelectasis in the lung bases. No airspace disease or consolidation in the lungs. No blunting of costophrenic angles. No pneumothorax. Mediastinal contours appear intact. Degenerative changes in the shoulders. IMPRESSION: Mild cardiac enlargement. Linear atelectasis in the lung bases. No evidence of active pulmonary disease. Electronically Signed   By: Lucienne Capers M.D.   On: 12/19/2018 21:42  Ct Abdomen Pelvis W Contrast  Addendum Date: 12/21/2018   ADDENDUM REPORT: 12/21/2018 10:40 ADDENDUM: There is an approximately 8-9 mm radiopaque stone adjacent to the inferior tip of the liver with a surrounding fluid collection. This is contiguous with intrahepatic abscess in hepatic segment 6. This stone was not present on the prior examination when the patient was diagnosed with acute appendicitis with numerous appendicoliths. This almost certainly represents a retained appendicolith which is the source of the patient's intrahepatic abscess. These results were called by telephone at the time of interpretation on 12/21/2018 to [facility], [place] , who verbally acknowledged these results. Electronically Signed   By: Jacqulynn Cadet M.D.   On: 12/21/2018 10:40   Result Date:  12/21/2018 CLINICAL DATA:  Nausea, vomiting, and diarrhea. Generalized body aches for 1 day. Dialysis for 3 years. Appendectomy on 12/06/2018. EXAM: CT ABDOMEN AND PELVIS WITH CONTRAST TECHNIQUE: Multidetector CT imaging of the abdomen and pelvis was performed using the standard protocol following bolus administration of intravenous contrast. CONTRAST:  138mL OMNIPAQUE IOHEXOL 300 MG/ML  SOLN COMPARISON:  12/06/2018 FINDINGS: Lower chest: Linear atelectasis in the lung bases. Hepatobiliary: Lobulated circumscribed cystic lesions demonstrated in the inferior liver, new since previous study, suggesting hepatic abscess. Largest lesion measures about 3.7 cm diameter. Gallbladder and bile ducts are unremarkable. Pancreas: Unremarkable. No pancreatic ductal dilatation or surrounding inflammatory changes. Spleen: Normal in size without focal abnormality. Adrenals/Urinary Tract: Adrenal glands are unremarkable. Kidneys are normal, without renal calculi, focal lesion, or hydronephrosis. Bladder is unremarkable. Stomach/Bowel: Stomach, small bowel, and colon are mostly decompressed, limiting evaluation of the wall. In the right lower quadrant, there is evidence of infiltration in the right lower quadrant fat with some wall thickening suggested in the cecum and ascending colon. Minimal soft tissue edema without definite loculation. This may represent postoperative change or residual inflammatory changes from previous appendicitis. Recurrent inflammatory process could also be present. Vascular/Lymphatic: No significant vascular findings are present. No enlarged abdominal or pelvic lymph nodes. There a few scattered prominent lymph nodes in the right lower quadrant, likely reactive. Reproductive: Uterus and bilateral adnexa are unremarkable. Other: No free air in the abdomen. Peritoneal nodule along the right lower quadrant anteriorly measuring 1.4 cm diameter, not present previously. This could represent postoperative changes a  suture granuloma or postoperative inflammatory reaction. Musculoskeletal: No acute or significant osseous findings. IMPRESSION: 1. Multiloculated cystic lesions demonstrated in the inferior liver, new since previous study, suggesting hepatic abscess. 2. Postoperative and/or inflammatory stranding and edema throughout the right lower quadrant fat with associated colonic wall thickening in the cecum and ascending colon. 3. Peritoneal nodule along the right lower quadrant anteriorly may represent postoperative changes or postoperative inflammatory reaction. Electronically Signed: By: Lucienne Capers M.D. On: 12/19/2018 22:44   Ct Image Guided Drainage By Percutaneous Catheter  Result Date: 12/21/2018 INDICATION: 53 year old female with a history of gangrenous acute appendicitis status post appendectomy. Recent CT imaging demonstrates and intrahepatic abscess with a small stone adjacent to the liver. The stone was not present on prior imaging and almost certainly represents a retained appendicolith which likely represents the source of the patient's hepatic abscess. She presents for CT-guided drainage of the abscess cavity. EXAM: CT-guided drain placement MEDICATIONS: The patient is currently admitted to the hospital and receiving intravenous antibiotics. The antibiotics were administered within an appropriate time frame prior to the initiation of the procedure. Additionally, 4 mg Zofran were administered intravenously for nausea. ANESTHESIA/SEDATION: Fentanyl 1 mcg IV; Versed 50 mg IV. Moderate  Sedation Time:  21 minutes The patient was continuously monitored during the procedure by the interventional radiology nurse under my direct supervision. COMPLICATIONS: None immediate. PROCEDURE: Informed written consent was obtained from the patient after a thorough discussion of the procedural risks, benefits and alternatives. All questions were addressed. Maximal Sterile Barrier Technique was utilized including caps,  mask, sterile gowns, sterile gloves, sterile drape, hand hygiene and skin antiseptic. A timeout was performed prior to the initiation of the procedure. A planning CT scan was performed. The low-attenuation collection in the inferior aspect of hepatic segment 6 is easily visualized. The small appendicoliths is again visualized. A suitable skin entry site was selected and marked. After the skin was sterilely prepped and draped in the standard fashion using chlorhexidine skin prep, local anesthesia was attained by infiltration with 1% lidocaine. A small dermatotomy was made. Under intermittent CT guidance, an 18 gauge trocar needle was advanced through the liver and into the fluid collection. A 0.035 wire was then coiled in the complex collection. The needle was removed. The skin and transhepatic tract were dilated to 10 [place] and a Cook 10.2 [place] all-purpose drainage catheter was advanced over the wire and formed. Aspiration yields approximately 20 cc of foul-smelling thick, purulent fluid. The aspirated fluid was sent for Gram stain and culture. Follow-up CT imaging demonstrates excellent position of the drainage catheter. The catheter was flushed, connected to JP bulb suction and secured to the skin with 0 Prolene suture. The patient tolerated the procedure well. IMPRESSION: Successful placement of a 10 French transhepatic drainage catheter into the inferior right hepatic abscess. Aspiration yields 20 mL of thick, foul-smelling purulent fluid. Samples were sent for culture. PLAN: 1. Maintain drain to JP bulb suction. 2. Flush drain at least once per shift. 3. Follow cultures and adjust antibiotics accordingly. 4. Surgery aware of the presence of the retained appendicoliths. This will represent a continued nidus for infection/abscess formation until removed. Electronically Signed   By: Jacqulynn Cadet M.D.   On: 12/21/2018 10:15    Medications: . sodium chloride 10 mL/hr at 12/21/18 1035  . ceFEPime  (MAXIPIME) IV 1 g (12/20/18 2249)  . metronidazole 500 mg (12/21/18 1036)  . vancomycin 1,000 mg (12/20/18 1625)   . amLODipine  10 mg Oral Daily  . atorvastatin  40 mg Oral Daily  . calcitRIOL  1 mcg Oral Daily  . Chlorhexidine Gluconate Cloth  6 each Topical Q0600  . darbepoetin (ARANESP) injection - DIALYSIS  100 mcg Intravenous Q Wed-HD  . fentaNYL      . ferric citrate  420 mg Oral TID WC  . insulin aspart  0-9 Units Subcutaneous TID WC  . lidocaine      . losartan  50 mg Oral Daily  . metoprolol tartrate  50 mg Oral BID  . midazolam      . multivitamin  1 tablet Oral QHS  . ondansetron      . sodium chloride flush  5 mL Intracatheter Q8H    Dialysis Orders: MWF - [facility]  3.75hrs, BFR 350, DFR AF 1.5,  EDW 90.5kg, 2K/ 2Ca  Access: LU AVF  Heparin 4500 units Mircera 100 mcg q2wks - last 11/26/18 Calcitriol 1 mcg PO qHD  Assessment/Plan: 1. Liver Abscess - identified on CT. Tmax overnight 100.60F. WBC dec 38.1>23.8. On vanc cefepime and flagyl. IR drained abscess this AM, with aspiration of 34mL foul smelling purulent sample & noted a free appendicolith as source.  Cultures sent. Surgery consulting- think she  will eventually need more surgery.     2. Bacteremia - BC+ enterocbacter cloacae.  On vanc cefepime and flagyl. Per primary 3. N/v/d - likely related to #2.  Ongoing. Meds per primary. 4.  ESRD -  On HD MWF.  K 3.4. Orders written for HD tomorrow per regular schedule.    5.  Anemia of CKD - Hgb 8.1.  Aranesp not given with HD yesterday, will order w/HD tomorrow.  6.  Secondary Hyperparathyroidism -  CCa in goal. Phos elevated.  Continues VDRA and binders once tolerating PO. 7.  Nutrition - Renal diet w/fluid restrictions. Protein supplements. Renal vitamins. 8. DMT2 - per primary  Jen Mow, PA-C [facility] Pager: [phone] 12/21/2018,10:47 AM  LOS: 1 day    Patient seen and examined, agree with above note with above modifications. Still  very ill- bacteremic- drain in place.  Surgery thinks she will eventually need further surgery- plan for routine HD tomorrow- BP fine- hgb low- on ESA  Corliss Parish, MD 12/21/2018

## 2018-12-21 NOTE — Progress Notes (Signed)
C-Diff results are negative. MD notified. MD stated to D/C enteric precautions.

## 2018-12-21 NOTE — Procedures (Signed)
Interventional Radiology Procedure Note  Procedure: CT guided placement of 23F drain into hepatic abscess.  Aspiration yields 20 mL foul-smelling purulent fluid.  Sample sent for Cx.   Complications: None  Estimated Blood Loss: None  Recommendations: - Return to room - Flush drain Q shift - Keep to JP bulb - Surgery alerted that source is a free appendicolith  Signed,  Criselda Peaches, MD

## 2018-12-21 NOTE — Progress Notes (Cosign Needed)
Family Medicine Teaching Service Daily Progress Note Intern Pager: [phone]  Patient name: [name] Medical record number: [id number] Date of birth: 04-07-1966 Age: 52 y.o. Gender: female  Primary Care Provider: Rory Percy, DO Consultants: General Surgery, IR, Nephro Code Status: Full  Pt Overview and Major Events to Date:  1/14 Admitted FPTS  CT Abd/pelv: multiloculated cystic lesions in inferior liver suggestive of hepatic abscess, post-op edema/inflammation in cecum and ascending colon 1/15   Assessment and Plan: 53 year old female with known ESRD, type 2 diabetes, hypertension, blindness, alcohol dependency presenting with 2 days history of nausea vomiting and diarrhea. Discharged on 1/4 s/p lap appendectomy in which "feculent material poured from appendix", received IV Zosyn x 3 days + JP drain, removed prior to d/c.   Liver Abscess  Sepsis  Enterobacter positive Bacteremia:  Blood ID panel and culture significant for Enterobacter sensitive to cefepime per pharmacy. IR consulted by general surgery, CT-guided drainage of hepatic abscess scheduled for today. Leukocytosis improving, 38.1>23.8. Patient currently on Vancomycin, Cefepime, and Flagyl. Do not feel vanc is necessary given low risk of MRSA infection culture results. Vitals stable overnight, remained afebrile. Still denies any abdominal pain. - General surgery consulted, appreciate recs - IR consulted, appreciate Recs, will follow-up after surgery - Discontinue Vancomycin - Continue Cefepime and Flagyl  - Tylenol PRN for pain and fever - Follow up susceptibilities  - kVO fluids  Hypokalemia:  K 3.4 today. Will continue to monitor and replace as needed. - BMP in AM  Nausea/Vomiting/Diarrhea: Concern for coffee-ground emesis on admission and new onset watery like diarrhea. C. difficile panel pending. Several episodes of vomiting overnight, but not recurrence of coffe ground emesis. Still having dark watery  diarrhea. Denies nausea or vomiting today.  Received soft fluids overnight. FOBT negative and hemoglobin stable. NO abdominal pain on exam this morning. - Continue to monitor - Follow up c. Diff panel - Phenergan for nausea as QTc mildly prolonged on EKG to 470  Tachycardia: resolved Presented with heart rate to 120s.  S/p fluid bolus in ED + soft fluids overnight and home Metoprolol, heart rate improved to 80-90's overnight. - Continue to monitor  S/P Laparoscopic appendectomy: stable POD 15. Healing well. Surgical wound on abdomen with mild epidermal separation, but no sign of infection or dehiscence. Colonic thickening noted on CT scan, per surgery will continue to monitor.  - General surgery consulted, appreciate recs  ESRD on HD Nephro consulted. Received partial HD on 1/15, ended early due to nausea and diarrhea. Home meds: dialyvite and [place]. EDW 90.5kg, Wt today 88kg. 0.3L out.  Labs this morning with K 3.4, BUN 34. -Nephro following, appreciate Recs - cont home dialyvite - cont home auryxia - monitor I/O's  T2DM  Peripheral Neuropathy  On humulin 70/30 BID and Gabapentin for neuropathy.  Last A1c 10/17/18 7.7. CBG range ON: 160-250. - CBG AC/HS - sSSI  Hx Alcohol dependency  Notes no alcohol in over 6 years. CIWA 2 overnight.  - monitor on CIWA protocol  HTN Takes amlodipine, losartain, and lopressor 50mg  BID at home. Normotensive ON.  BP this AM 119/68.   - cont home BP meds    HLD Takes lipitor at home. LFT's stable.  - cont home lipitor, would hold if LFTs increase  Blindness Can only see shapes OD, - up with assistance  GERD  Takes protonix 20mg  QD at home. - cont home protonix  FEN/GI: NPO pending surgery, Protonix, Phenergen PPx: SCDs  Disposition: Pending IR and gen  surg recs, continue IV abx  Subjective:  Patient denies any concerns today. Admits to some vomiting yesterday during HD, but notes it was the color of whatever she had drank or  ate that day. She has continued to have water diarrhea, but still denies any abdominal pain. She is feeling well after her procedure.   Objective: Temp:  [98.6 F (37 C)-100.1 F (37.8 C)] 98.7 F (37.1 C) (01/16 0959) Pulse Rate:  [88-102] 94 (01/16 0959) Resp:  [15-18] 18 (01/16 0959) BP: (118-138)/(53-73) 136/62 (01/16 0959) SpO2:  [93 %-100 %] 97 % (01/16 0959) Weight:  [88 kg] 88 kg (01/16 0351) Physical Exam: General: well nourished, well developed, in no acute distress with non-toxic appearance, lying comfortably in bed with husband at bedside HEENT: normocephalic, atraumatic, moist mucous membranes, blind at baseline Neck: supple, normal ROM CV: regular rate and rhythm, 2/6 systolic murmur heard best at left lower sternal border, no lower extremity edema Lungs: clear to auscultation bilaterally with normal work of breathing Abdomen: soft, non-tender to palpation, non-distended, normoactive bowel sounds, healing surgical wound superior to umbilicus with mild epidermal separation, no surrounding erythema Skin: warm, dry, no rashes or lesions Extremities: warm and well perfused, Right BKA , 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 Neuro: Alert and oriented, speech normal   Laboratory: Recent Labs  Lab 12/19/18 1932 12/20/18 0630 12/21/18 0714  WBC 23.8* 38.1* 23.8*  HGB 10.6* 8.6* 8.1*  HCT 35.1* 28.8* 26.8*  PLT 394 391 322   Recent Labs  Lab 12/19/18 1932 12/20/18 0630 12/21/18 0714  NA 139 136 139  K 3.8 3.7 3.4*  CL 90* 92* 99  CO2 24 27 22   BUN 38* 44* 34*  CREATININE 8.23* 9.19* 7.76*  CALCIUM 8.9 7.9* 8.0*  PROT 8.2* 6.6 6.2*  BILITOT 1.3* 0.8 1.4*  ALKPHOS 115 95 87  ALT 25 37 39  AST 47* 78* 77*  GLUCOSE 199* 250* 153*    Lactic acid: 1.71.82 Blood culture: pending Blood cx ID: Enterobacter cloacae complex HcG: neg FOBT: neg Trop: 0.01 Flu: neg Lipase: 60   Imaging/Diagnostic Tests: Dg Chest 2 View  Result Date: 12/19/2018 CLINICAL DATA:   Nausea, vomiting, and diarrhea. Generalized body aches over the last day. History of hypertension and dialysis. EXAM: CHEST - 2 VIEW COMPARISON:  11/16/2017 FINDINGS: Mild cardiac enlargement. Linear atelectasis in the lung bases. No airspace disease or consolidation in the lungs. No blunting of costophrenic angles. No pneumothorax. Mediastinal contours appear intact. Degenerative changes in the shoulders. IMPRESSION: Mild cardiac enlargement. Linear atelectasis in the lung bases. No evidence of active pulmonary disease. Electronically Signed   By: Lucienne Capers M.D.   On: 12/19/2018 21:42   Ct Abdomen Pelvis W Contrast  Addendum Date: 12/21/2018   ADDENDUM REPORT: 12/21/2018 10:40 ADDENDUM: There is an approximately 8-9 mm radiopaque stone adjacent to the inferior tip of the liver with a surrounding fluid collection. This is contiguous with intrahepatic abscess in hepatic segment 6. This stone was not present on the prior examination when the patient was diagnosed with acute appendicitis with numerous appendicoliths. This almost certainly represents a retained appendicolith which is the source of the patient's intrahepatic abscess. These results were called by telephone at the time of interpretation on 12/21/2018 to [facility], [place] , who verbally acknowledged these results. Electronically Signed   By: Jacqulynn Cadet M.D.   On: 12/21/2018 10:40   Result Date: 12/21/2018 CLINICAL DATA:  Nausea, vomiting, and diarrhea. Generalized body aches for 1  day. Dialysis for 3 years. Appendectomy on 12/06/2018. EXAM: CT ABDOMEN AND PELVIS WITH CONTRAST TECHNIQUE: Multidetector CT imaging of the abdomen and pelvis was performed using the standard protocol following bolus administration of intravenous contrast. CONTRAST:  173mL OMNIPAQUE IOHEXOL 300 MG/ML  SOLN COMPARISON:  12/06/2018 FINDINGS: Lower chest: Linear atelectasis in the lung bases. Hepatobiliary: Lobulated circumscribed cystic lesions demonstrated in the  inferior liver, new since previous study, suggesting hepatic abscess. Largest lesion measures about 3.7 cm diameter. Gallbladder and bile ducts are unremarkable. Pancreas: Unremarkable. No pancreatic ductal dilatation or surrounding inflammatory changes. Spleen: Normal in size without focal abnormality. Adrenals/Urinary Tract: Adrenal glands are unremarkable. Kidneys are normal, without renal calculi, focal lesion, or hydronephrosis. Bladder is unremarkable. Stomach/Bowel: Stomach, small bowel, and colon are mostly decompressed, limiting evaluation of the wall. In the right lower quadrant, there is evidence of infiltration in the right lower quadrant fat with some wall thickening suggested in the cecum and ascending colon. Minimal soft tissue edema without definite loculation. This may represent postoperative change or residual inflammatory changes from previous appendicitis. Recurrent inflammatory process could also be present. Vascular/Lymphatic: No significant vascular findings are present. No enlarged abdominal or pelvic lymph nodes. There a few scattered prominent lymph nodes in the right lower quadrant, likely reactive. Reproductive: Uterus and bilateral adnexa are unremarkable. Other: No free air in the abdomen. Peritoneal nodule along the right lower quadrant anteriorly measuring 1.4 cm diameter, not present previously. This could represent postoperative changes a suture granuloma or postoperative inflammatory reaction. Musculoskeletal: No acute or significant osseous findings. IMPRESSION: 1. Multiloculated cystic lesions demonstrated in the inferior liver, new since previous study, suggesting hepatic abscess. 2. Postoperative and/or inflammatory stranding and edema throughout the right lower quadrant fat with associated colonic wall thickening in the cecum and ascending colon. 3. Peritoneal nodule along the right lower quadrant anteriorly may represent postoperative changes or postoperative inflammatory  reaction. Electronically Signed: By: Lucienne Capers M.D. On: 12/19/2018 22:44   Ct Image Guided Drainage By Percutaneous Catheter  Result Date: 12/21/2018 INDICATION: 53 year old female with a history of gangrenous acute appendicitis status post appendectomy. Recent CT imaging demonstrates and intrahepatic abscess with a small stone adjacent to the liver. The stone was not present on prior imaging and almost certainly represents a retained appendicolith which likely represents the source of the patient's hepatic abscess. She presents for CT-guided drainage of the abscess cavity. EXAM: CT-guided drain placement MEDICATIONS: The patient is currently admitted to the hospital and receiving intravenous antibiotics. The antibiotics were administered within an appropriate time frame prior to the initiation of the procedure. Additionally, 4 mg Zofran were administered intravenously for nausea. ANESTHESIA/SEDATION: Fentanyl 1 mcg IV; Versed 50 mg IV. Moderate Sedation Time:  21 minutes The patient was continuously monitored during the procedure by the interventional radiology nurse under my direct supervision. COMPLICATIONS: None immediate. PROCEDURE: Informed written consent was obtained from the patient after a thorough discussion of the procedural risks, benefits and alternatives. All questions were addressed. Maximal Sterile Barrier Technique was utilized including caps, mask, sterile gowns, sterile gloves, sterile drape, hand hygiene and skin antiseptic. A timeout was performed prior to the initiation of the procedure. A planning CT scan was performed. The low-attenuation collection in the inferior aspect of hepatic segment 6 is easily visualized. The small appendicoliths is again visualized. A suitable skin entry site was selected and marked. After the skin was sterilely prepped and draped in the standard fashion using chlorhexidine skin prep, local anesthesia was attained by infiltration  with 1% lidocaine. A  small dermatotomy was made. Under intermittent CT guidance, an 18 gauge trocar needle was advanced through the liver and into the fluid collection. A 0.035 wire was then coiled in the complex collection. The needle was removed. The skin and transhepatic tract were dilated to 10 [place] and a Cook 10.2 [place] all-purpose drainage catheter was advanced over the wire and formed. Aspiration yields approximately 20 cc of foul-smelling thick, purulent fluid. The aspirated fluid was sent for Gram stain and culture. Follow-up CT imaging demonstrates excellent position of the drainage catheter. The catheter was flushed, connected to JP bulb suction and secured to the skin with 0 Prolene suture. The patient tolerated the procedure well. IMPRESSION: Successful placement of a 10 French transhepatic drainage catheter into the inferior right hepatic abscess. Aspiration yields 20 mL of thick, foul-smelling purulent fluid. Samples were sent for culture. PLAN: 1. Maintain drain to JP bulb suction. 2. Flush drain at least once per shift. 3. Follow cultures and adjust antibiotics accordingly. 4. Surgery aware of the presence of the retained appendicoliths. This will represent a continued nidus for infection/abscess formation until removed. Electronically Signed   By: Jacqulynn Cadet M.D.   On: 12/21/2018 10:15    Danna Hefty, DO 12/21/2018, 5:01 PM PGY-1, [place] Intern pager: [phone], text pages welcome

## 2018-12-22 DIAGNOSIS — R7881 Bacteremia: Secondary | ICD-10-CM

## 2018-12-22 DIAGNOSIS — B952 Enterococcus as the cause of diseases classified elsewhere: Secondary | ICD-10-CM

## 2018-12-22 LAB — GASTROINTESTINAL PANEL BY PCR, STOOL (REPLACES STOOL CULTURE)

## 2018-12-22 LAB — BASIC METABOLIC PANEL
Anion gap: 18 — ABNORMAL HIGH (ref 5–15)
BUN: 46 mg/dL — ABNORMAL HIGH (ref 6–20)
CO2: 21 mmol/L — ABNORMAL LOW (ref 22–32)
Calcium: 8 mg/dL — ABNORMAL LOW (ref 8.9–10.3)
Chloride: 99 mmol/L (ref 98–111)
Creatinine, Ser: 9.44 mg/dL — ABNORMAL HIGH (ref 0.44–1.00)
GFR calc Af Amer: 5 mL/min — ABNORMAL LOW (ref >60)
GFR calc non Af Amer: 4 mL/min — ABNORMAL LOW (ref >60)
Glucose, Bld: 169 mg/dL — ABNORMAL HIGH (ref 70–99)
Potassium: 3.5 mmol/L (ref 3.5–5.1)
Sodium: 138 mmol/L (ref 135–145)

## 2018-12-22 LAB — CBC
HCT: 27.2 % — ABNORMAL LOW (ref 36.0–46.0)
Hemoglobin: 8.2 g/dL — ABNORMAL LOW (ref 12.0–15.0)
MCH: 22.5 pg — ABNORMAL LOW (ref 26.0–34.0)
MCHC: 30.1 g/dL (ref 30.0–36.0)
MCV: 74.5 fL — ABNORMAL LOW (ref 80.0–100.0)
Platelets: 360 10*3/uL (ref 150–400)
RBC: 3.65 MIL/uL — ABNORMAL LOW (ref 3.87–5.11)
RDW: 18 % — ABNORMAL HIGH (ref 11.5–15.5)
WBC: 21.2 10*3/uL — ABNORMAL HIGH (ref 4.0–10.5)
nRBC: 0 % (ref 0.0–0.2)

## 2018-12-22 LAB — GLUCOSE, CAPILLARY
Glucose-Capillary: 108 mg/dL — ABNORMAL HIGH (ref 70–99)
Glucose-Capillary: 127 mg/dL — ABNORMAL HIGH (ref 70–99)
Glucose-Capillary: 158 mg/dL — ABNORMAL HIGH (ref 70–99)

## 2018-12-22 MED ORDER — PROMETHAZINE HCL 25 MG/ML IJ SOLN
INTRAMUSCULAR | Status: AC
  Administered 2018-12-22: 12.5 mg via INTRAVENOUS
  Filled 2018-12-22: qty 1

## 2018-12-22 MED ORDER — DARBEPOETIN ALFA 100 MCG/0.5ML IJ SOSY
PREFILLED_SYRINGE | INTRAMUSCULAR | Status: AC
  Administered 2018-12-22: 100 ug via INTRAVENOUS
  Filled 2018-12-22: qty 0.5

## 2018-12-22 MED ORDER — HEPARIN SODIUM (PORCINE) 1000 UNIT/ML DIALYSIS: 20.0000 [IU]/kg | INTRAMUSCULAR | Status: DC | PRN

## 2018-12-22 MED ORDER — HEPARIN SODIUM (PORCINE) 1000 UNIT/ML IJ SOLN
INTRAMUSCULAR | Status: AC
  Filled 2018-12-22: qty 2

## 2018-12-22 MED ORDER — CALCITRIOL 0.5 MCG PO CAPS
ORAL_CAPSULE | ORAL | Status: AC
  Administered 2018-12-22: 1 ug via ORAL
  Filled 2018-12-22: qty 2

## 2018-12-22 MED ORDER — CALCIUM CARBONATE ANTACID 500 MG PO CHEW
1.0000 | CHEWABLE_TABLET | Freq: Two times a day (BID) | ORAL | Status: AC | PRN
  Administered 2018-12-22 – 2018-12-24 (×4): 200 mg via ORAL
  Filled 2018-12-22 (×4): qty 1

## 2018-12-22 MED ORDER — SCOPOLAMINE 1 MG/3DAYS TD PT72
1.0000 | MEDICATED_PATCH | TRANSDERMAL | Status: DC
  Administered 2018-12-22 – 2018-12-25 (×2): 1.5 mg via TRANSDERMAL
  Filled 2018-12-22 (×2): qty 1

## 2018-12-22 MED ORDER — SODIUM CHLORIDE 0.9 % IV SOLN
2.0000 g | INTRAVENOUS | Status: DC
  Administered 2018-12-22 – 2018-12-24 (×2): 2 g via INTRAVENOUS
  Filled 2018-12-22 (×3): qty 20

## 2018-12-22 MED ORDER — SODIUM CHLORIDE 0.9 % IV BOLUS
500.0000 mL | Freq: Once | INTRAVENOUS | Status: AC
  Administered 2018-12-22: 500 mL via INTRAVENOUS

## 2018-12-22 MED ORDER — SACCHAROMYCES BOULARDII 250 MG PO CAPS
250.0000 mg | ORAL_CAPSULE | Freq: Two times a day (BID) | ORAL | Status: DC
  Administered 2018-12-22 – 2018-12-24 (×5): 250 mg via ORAL
  Filled 2018-12-22 (×6): qty 1

## 2018-12-22 NOTE — Procedures (Signed)
Patient was seen on dialysis and the procedure was supervised.  BFR 380  Via AVF BP is  147/76.   Patient appears to be tolerating treatment well  [name] 12/22/2018

## 2018-12-22 NOTE — Progress Notes (Signed)
Referring Physician(s): Dr Hinton Rao  Supervising Physician: Aletta Edouard  Patient Status:  [name] - In-pt  Chief Complaint:  Hepatic abscess  Subjective:  12/21/18:  placement of 60F drain into hepatic abscess. Feeling better already Now with diarrhea though   Allergies: Patient has no known allergies.  Medications: Prior to Admission medications   Medication Sig Start Date End Date Taking? Authorizing Provider  amLODipine (NORVASC) 10 MG tablet TAKE 1 TABLET BY MOUTH ONCE DAILY Patient taking differently: 10 mg daily.  05/11/18  Yes Rory Percy, DO  atorvastatin (LIPITOR) 40 MG tablet TAKE 1 TABLET BY MOUTH ONCE DAILY Patient taking differently: Take 40 mg by mouth daily at 6 PM.  05/11/18  Yes Rumball, Bryson Ha, DO  AURYXIA 1 GM 210 MG(Fe) tablet Take 210-420 mg by mouth See admin instructions. Take 420 mg with meals and 210 mg with snacks 10/06/18  Yes [provider]  B Complex-C-Folic Acid (DIALYVITE 202) 0.8 MG TABS Take 1 tablet by mouth daily. 10/09/18  Yes [provider]  gabapentin (NEURONTIN) 100 MG capsule TAKE 1 CAPSULE BY MOUTH ONCE DAILY AS NEEDED Patient taking differently: Take 100 mg by mouth daily.  11/01/18  Yes Rumball, Alison, DO  HUMULIN 70/30 KWIKPEN (70-30) 100 UNIT/ML PEN INJECT 10 UNITS SUBCUTANEOUSLY AT BREAKFAST AND AT SUPPER Patient taking differently: Inject 5-10 Units into the skin 2 (two) times daily.  11/01/18  Yes Rory Percy, DO  losartan (COZAAR) 50 MG tablet Take 1 tablet (50 mg total) by mouth daily. 08/10/18  Yes Rory Percy, DO  metoprolol tartrate (LOPRESSOR) 50 MG tablet TAKE 1 TABLET BY MOUTH TWICE DAILY Patient taking differently: Take 50 mg by mouth 2 (two) times daily.  08/10/18  Yes Rory Percy, DO  mupirocin cream (BACTROBAN) 2 % Apply 1 application topically 2 (two) times daily. 10/26/17  Yes Mayo, Pete Pelt, MD  Blood Glucose Monitoring Suppl (PRODIGY AUTOCODE BLOOD GLUCOSE) w/Device KIT 1 Device by  Does not apply route 3 (three) times daily. 10/26/18   Rory Percy, DO  glucose blood test strip Use as instructed 10/26/18   Rory Percy, DO  Insulin Pen Needle (EASY TOUCH PEN NEEDLES) 32G X 4 MM MISC 1 each by Does not apply route 3 (three) times daily. 08/10/18   Rory Percy, DO  Lancet Devices (ONE TOUCH DELICA LANCING DEV) MISC Check blood sugars once daily 05/25/18   Mayo, Pete Pelt, MD  PRODIGY LANCETS 28G MISC 1 each by Does not apply route 3 (three) times daily. 10/26/18   Rory Percy, DO     Vital Signs: BP 140/64 (BP Location: Right Arm)   Pulse 92   Temp 99 F (37.2 C) (Oral)   Resp 18   Ht '5\' 7"'  (1.702 m)   Wt 194 lb 10.7 oz (88.3 kg)   SpO2 99%   BMI 30.49 kg/m   Physical Exam Vitals signs reviewed.  Skin:    General: Skin is warm and dry.     Comments: Site is clean and dry NT No bleeding OP blood tinged   Wbc down Afeb  Neurological:     Mental Status: She is oriented to person, place, and time.     Imaging: Dg Chest 2 View  Result Date: 12/19/2018 CLINICAL DATA:  Nausea, vomiting, and diarrhea. Generalized body aches over the last day. History of hypertension and dialysis. EXAM: CHEST - 2 VIEW COMPARISON:  11/16/2017 FINDINGS: Mild cardiac enlargement. Linear atelectasis in the lung bases. No airspace disease or  consolidation in the lungs. No blunting of costophrenic angles. No pneumothorax. Mediastinal contours appear intact. Degenerative changes in the shoulders. IMPRESSION: Mild cardiac enlargement. Linear atelectasis in the lung bases. No evidence of active pulmonary disease. Electronically Signed   By: Lucienne Capers M.D.   On: 12/19/2018 21:42   Ct Abdomen Pelvis W Contrast  Addendum Date: 12/21/2018   ADDENDUM REPORT: 12/21/2018 10:40 ADDENDUM: There is an approximately 8-9 mm radiopaque stone adjacent to the inferior tip of the liver with a surrounding fluid collection. This is contiguous with intrahepatic abscess in hepatic segment  6. This stone was not present on the prior examination when the patient was diagnosed with acute appendicitis with numerous appendicoliths. This almost certainly represents a retained appendicolith which is the source of the patient's intrahepatic abscess. These results were called by telephone at the time of interpretation on 12/21/2018 to [name], [place] , who verbally acknowledged these results. Electronically Signed   By: Jacqulynn Cadet M.D.   On: 12/21/2018 10:40   Result Date: 12/21/2018 CLINICAL DATA:  Nausea, vomiting, and diarrhea. Generalized body aches for 1 day. Dialysis for 3 years. Appendectomy on 12/06/2018. EXAM: CT ABDOMEN AND PELVIS WITH CONTRAST TECHNIQUE: Multidetector CT imaging of the abdomen and pelvis was performed using the standard protocol following bolus administration of intravenous contrast. CONTRAST:  115m OMNIPAQUE IOHEXOL 300 MG/ML  SOLN COMPARISON:  12/06/2018 FINDINGS: Lower chest: Linear atelectasis in the lung bases. Hepatobiliary: Lobulated circumscribed cystic lesions demonstrated in the inferior liver, new since previous study, suggesting hepatic abscess. Largest lesion measures about 3.7 cm diameter. Gallbladder and bile ducts are unremarkable. Pancreas: Unremarkable. No pancreatic ductal dilatation or surrounding inflammatory changes. Spleen: Normal in size without focal abnormality. Adrenals/Urinary Tract: Adrenal glands are unremarkable. Kidneys are normal, without renal calculi, focal lesion, or hydronephrosis. Bladder is unremarkable. Stomach/Bowel: Stomach, small bowel, and colon are mostly decompressed, limiting evaluation of the wall. In the right lower quadrant, there is evidence of infiltration in the right lower quadrant fat with some wall thickening suggested in the cecum and ascending colon. Minimal soft tissue edema without definite loculation. This may represent postoperative change or residual inflammatory changes from previous appendicitis. Recurrent  inflammatory process could also be present. Vascular/Lymphatic: No significant vascular findings are present. No enlarged abdominal or pelvic lymph nodes. There a few scattered prominent lymph nodes in the right lower quadrant, likely reactive. Reproductive: Uterus and bilateral adnexa are unremarkable. Other: No free air in the abdomen. Peritoneal nodule along the right lower quadrant anteriorly measuring 1.4 cm diameter, not present previously. This could represent postoperative changes a suture granuloma or postoperative inflammatory reaction. Musculoskeletal: No acute or significant osseous findings. IMPRESSION: 1. Multiloculated cystic lesions demonstrated in the inferior liver, new since previous study, suggesting hepatic abscess. 2. Postoperative and/or inflammatory stranding and edema throughout the right lower quadrant fat with associated colonic wall thickening in the cecum and ascending colon. 3. Peritoneal nodule along the right lower quadrant anteriorly may represent postoperative changes or postoperative inflammatory reaction. Electronically Signed: By: WLucienne CapersM.D. On: 12/19/2018 22:44   Ct Image Guided Drainage By Percutaneous Catheter  Result Date: 12/21/2018 INDICATION: 53year old female with a history of gangrenous acute appendicitis status post appendectomy. Recent CT imaging demonstrates and intrahepatic abscess with a small stone adjacent to the liver. The stone was not present on prior imaging and almost certainly represents a retained appendicolith which likely represents the source of the patient's hepatic abscess. She presents for CT-guided drainage of the abscess cavity.  EXAM: CT-guided drain placement MEDICATIONS: The patient is currently admitted to the hospital and receiving intravenous antibiotics. The antibiotics were administered within an appropriate time frame prior to the initiation of the procedure. Additionally, 4 mg Zofran were administered intravenously for  nausea. ANESTHESIA/SEDATION: Fentanyl 1 mcg IV; Versed 50 mg IV. Moderate Sedation Time:  21 minutes The patient was continuously monitored during the procedure by the interventional radiology nurse under my direct supervision. COMPLICATIONS: None immediate. PROCEDURE: Informed written consent was obtained from the patient after a thorough discussion of the procedural risks, benefits and alternatives. All questions were addressed. Maximal Sterile Barrier Technique was utilized including caps, mask, sterile gowns, sterile gloves, sterile drape, hand hygiene and skin antiseptic. A timeout was performed prior to the initiation of the procedure. A planning CT scan was performed. The low-attenuation collection in the inferior aspect of hepatic segment 6 is easily visualized. The small appendicoliths is again visualized. A suitable skin entry site was selected and marked. After the skin was sterilely prepped and draped in the standard fashion using chlorhexidine skin prep, local anesthesia was attained by infiltration with 1% lidocaine. A small dermatotomy was made. Under intermittent CT guidance, an 18 gauge trocar needle was advanced through the liver and into the fluid collection. A 0.035 wire was then coiled in the complex collection. The needle was removed. The skin and transhepatic tract were dilated to 10 [place] and a Cook 10.2 [place] all-purpose drainage catheter was advanced over the wire and formed. Aspiration yields approximately 20 cc of foul-smelling thick, purulent fluid. The aspirated fluid was sent for Gram stain and culture. Follow-up CT imaging demonstrates excellent position of the drainage catheter. The catheter was flushed, connected to JP bulb suction and secured to the skin with 0 Prolene suture. The patient tolerated the procedure well. IMPRESSION: Successful placement of a 10 French transhepatic drainage catheter into the inferior right hepatic abscess. Aspiration yields 20 mL of thick,  foul-smelling purulent fluid. Samples were sent for culture. PLAN: 1. Maintain drain to JP bulb suction. 2. Flush drain at least once per shift. 3. Follow cultures and adjust antibiotics accordingly. 4. Surgery aware of the presence of the retained appendicoliths. This will represent a continued nidus for infection/abscess formation until removed. Electronically Signed   By: Jacqulynn Cadet M.D.   On: 12/21/2018 10:15    Labs:  CBC: Recent Labs    12/19/18 1932 12/20/18 0630 12/21/18 0714 12/22/18 0350  WBC 23.8* 38.1* 23.8* 21.2*  HGB 10.6* 8.6* 8.1* 8.2*  HCT 35.1* 28.8* 26.8* 27.2*  PLT 394 391 322 360    COAGS: Recent Labs    12/20/18 0019  INR 1.23    BMP: Recent Labs    12/19/18 1932 12/20/18 0630 12/21/18 0714 12/22/18 0350  NA 139 136 139 138  K 3.8 3.7 3.4* 3.5  CL 90* 92* 99 99  CO2 '24 27 22 ' 21*  GLUCOSE 199* 250* 153* 169*  BUN 38* 44* 34* 46*  CALCIUM 8.9 7.9* 8.0* 8.0*  CREATININE 8.23* 9.19* 7.76* 9.44*  GFRNONAA 5* 4* 5* 4*  GFRAA 6* 5* 6* 5*    LIVER FUNCTION TESTS: Recent Labs    12/06/18 1808 12/08/18 0723 12/19/18 1932 12/20/18 0630 12/21/18 0714  BILITOT 0.4  --  1.3* 0.8 1.4*  AST 15  --  47* 78* 77*  ALT 12  --  25 37 39  ALKPHOS 70  --  115 95 87  PROT 7.4  --  8.2* 6.6 6.2*  ALBUMIN 3.5 2.5* 2.8* 2.2* 2.0*    Assessment and Plan:  Hepatic abscess drain intact OP good; wbc trending down; Afeb Will follow  Electronically Signed: Lavonia Drafts, PA-C 12/22/2018, 12:47 PM   I spent a total of 15 Minutes at the the patient's bedside AND on the patient's hospital floor or unit, greater than 50% of which was counseling/coordinating care for hepatic abscess

## 2018-12-22 NOTE — Discharge Summary (Signed)
[name]  Patient name: [name] Medical record number: [id number] Date of birth: 10/01/1966 Age: 53 y.o. Gender: female Date of Admission: 12/19/2018  Date of Discharge: 12/26/18 Admitting Physician: Alveda Reasons, MD  Primary Care Provider: Rory Percy, DO Consultants: ID, Nephrology,   Indication for Hospitalization: Hepatic Abscess  Discharge Diagnoses/Problem List:  Non-intractable vomiting  Enterococcal bacteremia  ESRD on dialysis ([place])  Moderate protein-calorie malnutrition ([place])  Liver abscess  Coffee ground emesis  Diarrhea  Sepsis without acute organ dysfunction ([place])  Diabetes mellitus due to underlying condition with chronic kidney disease on chronic dialysis ([place]) Hyperlipidemia S/P BKA (below knee amputation) unilateral, right ([place])     Disposition: home  Discharge Condition: improved, stable  Discharge Exam: Temp:  [98.3 F (36.8 C)-99.3 F (37.4 C)] 99.3 F (37.4 C) (01/21 0500) Pulse Rate:  [79-94] 91 (01/21 0500) Resp:  [18-20] 20 (01/21 0500) BP: (113-158)/(61-84) 143/72 (01/21 0500) SpO2:  [98 %-100 %] 98 % (01/21 0500) Weight:  [88 kg-89.7 kg] 88.4 kg (01/21 0500) Physical Exam: General: awake and alert, sitting up in bed eating breakfast  Cardiovascular: RRR, no murmurs.   Respiratory: CTAB, no wheezes, rales, or rhonchi  Abdomen: soft, non tender, non distended, bowel sounds normal.  Eschar with surrounding induration but no erythema or tenderness around naval.  Draining serous fluid from drain.    Extremities: no edema, non tender   Brief Hospital Course:  [name] is a 53 y.o. female with past medical history significant for ESRD on HD MWF, T2DM, HTN, HLD, BKA, blindness, and history of alcohol dependency who is s/p laparoscopic appendectomy for gangrenous appendicitis on 1/1 by Dr. Kieth Brightly, who presented with two day history of nausea, vomiting, and diarrhea and found to  have multiloculated liver abscess  and polymicrobial bacteremia positive for Klebsiella pneumonia and Enterobacter. Hospital course outlined below.  Hepatic Abscess and Enterobacter and Klebsiella positive Bacteremia: Patient presented with acute onset symptoms of nausea, vomiting, and diarrhea. She was febrile to 101.1 with tachycardia to 120 and tachypnea to 25. Initial work up included leukocytosis of 23.8, lactic acid 1.7, and CT abdomen/pelvis significant for multiloculated cystic lesions in inferior liver suggestive of hepatic abscess and a retained appendicolith which was found to be the source of the patient's intrahepatic abscess. General surgery was consulted and CT guided drainage of hepatic abscess was performed by IR on 3/71 without complication. Blood culture and Fluid culture were significant for Enterobacter and Klebsiella. Patient was initially started on Vanc, Cefepime, and Flagyl and then transitioned to Ceftriaxone and Flagyl upon return of sensitivities. Infectious disease was consulted and given retained appendicolith, recommended 6 weeks of systemic antibiotics and repeat CT abd/pelvis prior to discontinuing systemic antibiotics.  She was ultimately transitioned to IV Ceftazidime to be given with dialysis and oral Flagyl 510m TID. Future surgery to remove retained appendicolith to be scheduled by general surgery. Per ID, she will need to continue oral antibiotics beyond 6 weeks if surgery is not completed by then. Repeat blood culture obtained and negative x 4d. Patient remained hemodynamically stable and afebrile for >48 hours prior to discharge. Drain was kept in place and patient instructed to flush drain QD with 5 cc NS and record output once daily on orange drain card at discharge. She is to follow up with IR as outpatient for continued drain care.  Return precautions discussed at length and follow up with ID, IR, and general surgery arranged prior to discharge.  Nausea, Vomiting,  Diarrhea: Etiology unclear at time of discharge. Work up for cause included GI panel and c. Diff which all returned negative. Patient was given gentle fluids as needed to maintain adequate hydration, Probiotics and phenergan/scopalomine for nausea. Her symptoms improved daily and by day of discharge was tolerating PO without any further symptoms.   Diabetes:  Patient's diabetes was controlled with insulin during her admission.  She was previously on 70/30 mix pen that her boyfriend would dial up for her twice a day due to her blindness. she was switched to levemir once a day.  Patient and her boyfriend were educated on diabetes medication administration.   Patient and patient's boyfriend were educated on the maintenance and flushing of her drain.  Home health RN was also set up to come multiple times per week and can further assist with patient education.   Issues for Follow Up:  1. Ensure follow up with IR, ID, and general surgery 2. Ensure continued tolerance of antibiotics  3. Ensure patient is flushing drain QD and recording output adequately  4. Assess adherence and tolerance of new insulin regimen.    Significant Procedures:  CT guided drainage of hepatic Abscess CT abdomen/pelvis  Significant Labs and Imaging:  Recent Labs  Lab 12/24/18 0527 12/25/18 0332 12/26/18 0637  WBC 22.7* 21.1* 22.2*  HGB 9.7* 9.2* 9.0*  HCT 31.0* 30.5* 29.5*  PLT 335 410* 354   Recent Labs  Lab 12/22/18 0350 12/24/18 0527 12/25/18 0332 12/26/18 0637  NA 138 136 135 136  K 3.5 3.9 3.6 3.6  CL 99 96* 97* 96*  CO2 21* 21* 20* 25  GLUCOSE 169* 236* 236* 132*  BUN 46* 45* 59* 24*  CREATININE 9.44* 7.84* 9.67* 5.82*  CALCIUM 8.0* 8.5* 8.5* 8.2*   C.diff: neg GI panel: neg Aerobic/Anaerobic Wound culture: ENTEROBACTER CLOACAE, KLEBSIELLA OXYTOCA  Blood culture: ENTEROBACTER CLOACAE, KLEBSIELLA OXYTOCA  Lactic Acid: 1.7>1.82 Troponin: neg Flu A/B: neg Lipase 60  Dg Chest 2 View  Result  Date: 12/19/2018 CLINICAL DATA:  Nausea, vomiting, and diarrhea. Generalized body aches over the last day. History of hypertension and dialysis. EXAM: CHEST - 2 VIEW COMPARISON:  11/16/2017 FINDINGS: Mild cardiac enlargement. Linear atelectasis in the lung bases. No airspace disease or consolidation in the lungs. No blunting of costophrenic angles. No pneumothorax. Mediastinal contours appear intact. Degenerative changes in the shoulders. IMPRESSION: Mild cardiac enlargement. Linear atelectasis in the lung bases. No evidence of active pulmonary disease. Electronically Signed   By: Lucienne Capers M.D.   On: 12/19/2018 21:42   Ct Abdomen Pelvis W Contrast  Addendum Date: 12/21/2018   ADDENDUM REPORT: 12/21/2018 10:40 ADDENDUM: There is an approximately 8-9 mm radiopaque stone adjacent to the inferior tip of the liver with a surrounding fluid collection. This is contiguous with intrahepatic abscess in hepatic segment 6. This stone was not present on the prior examination when the patient was diagnosed with acute appendicitis with numerous appendicoliths. This almost certainly represents a retained appendicolith which is the source of the patient's intrahepatic abscess. These results were called by telephone at the time of interpretation on 12/21/2018 to [facility], [place] , who verbally acknowledged these results. Electronically Signed   By: Jacqulynn Cadet M.D.   On: 12/21/2018 10:40   Result Date: 12/21/2018 CLINICAL DATA:  Nausea, vomiting, and diarrhea. Generalized body aches for 1 day. Dialysis for 3 years. Appendectomy on 12/06/2018. EXAM: CT ABDOMEN AND PELVIS WITH CONTRAST TECHNIQUE: Multidetector CT imaging of the abdomen and  pelvis was performed using the standard protocol following bolus administration of intravenous contrast. CONTRAST:  139m OMNIPAQUE IOHEXOL 300 MG/ML  SOLN COMPARISON:  12/06/2018 FINDINGS: Lower chest: Linear atelectasis in the lung bases. Hepatobiliary: Lobulated circumscribed  cystic lesions demonstrated in the inferior liver, new since previous study, suggesting hepatic abscess. Largest lesion measures about 3.7 cm diameter. Gallbladder and bile ducts are unremarkable. Pancreas: Unremarkable. No pancreatic ductal dilatation or surrounding inflammatory changes. Spleen: Normal in size without focal abnormality. Adrenals/Urinary Tract: Adrenal glands are unremarkable. Kidneys are normal, without renal calculi, focal lesion, or hydronephrosis. Bladder is unremarkable. Stomach/Bowel: Stomach, small bowel, and colon are mostly decompressed, limiting evaluation of the wall. In the right lower quadrant, there is evidence of infiltration in the right lower quadrant fat with some wall thickening suggested in the cecum and ascending colon. Minimal soft tissue edema without definite loculation. This may represent postoperative change or residual inflammatory changes from previous appendicitis. Recurrent inflammatory process could also be present. Vascular/Lymphatic: No significant vascular findings are present. No enlarged abdominal or pelvic lymph nodes. There a few scattered prominent lymph nodes in the right lower quadrant, likely reactive. Reproductive: Uterus and bilateral adnexa are unremarkable. Other: No free air in the abdomen. Peritoneal nodule along the right lower quadrant anteriorly measuring 1.4 cm diameter, not present previously. This could represent postoperative changes a suture granuloma or postoperative inflammatory reaction. Musculoskeletal: No acute or significant osseous findings. IMPRESSION: 1. Multiloculated cystic lesions demonstrated in the inferior liver, new since previous study, suggesting hepatic abscess. 2. Postoperative and/or inflammatory stranding and edema throughout the right lower quadrant fat with associated colonic wall thickening in the cecum and ascending colon. 3. Peritoneal nodule along the right lower quadrant anteriorly may represent postoperative  changes or postoperative inflammatory reaction. Electronically Signed: By: WLucienne CapersM.D. On: 12/19/2018 22:44   Ct Image Guided Drainage By Percutaneous Catheter  Result Date: 12/21/2018 INDICATION: 53year old female with a history of gangrenous acute appendicitis status post appendectomy. Recent CT imaging demonstrates and intrahepatic abscess with a small stone adjacent to the liver. The stone was not present on prior imaging and almost certainly represents a retained appendicolith which likely represents the source of the patient's hepatic abscess. She presents for CT-guided drainage of the abscess cavity. EXAM: CT-guided drain placement MEDICATIONS: The patient is currently admitted to the hospital and receiving intravenous antibiotics. The antibiotics were administered within an appropriate time frame prior to the initiation of the procedure. Additionally, 4 mg Zofran were administered intravenously for nausea. ANESTHESIA/SEDATION: Fentanyl 1 mcg IV; Versed 50 mg IV. Moderate Sedation Time:  21 minutes The patient was continuously monitored during the procedure by the interventional radiology nurse under my direct supervision. COMPLICATIONS: None immediate. PROCEDURE: Informed written consent was obtained from the patient after a thorough discussion of the procedural risks, benefits and alternatives. All questions were addressed. Maximal Sterile Barrier Technique was utilized including caps, mask, sterile gowns, sterile gloves, sterile drape, hand hygiene and skin antiseptic. A timeout was performed prior to the initiation of the procedure. A planning CT scan was performed. The low-attenuation collection in the inferior aspect of hepatic segment 6 is easily visualized. The small appendicoliths is again visualized. A suitable skin entry site was selected and marked. After the skin was sterilely prepped and draped in the standard fashion using chlorhexidine skin prep, local anesthesia was attained by  infiltration with 1% lidocaine. A small dermatotomy was made. Under intermittent CT guidance, an 18 gauge trocar needle was advanced through the liver  and into the fluid collection. A 0.035 wire was then coiled in the complex collection. The needle was removed. The skin and transhepatic tract were dilated to 10 [place] and a Cook 10.2 [place] all-purpose drainage catheter was advanced over the wire and formed. Aspiration yields approximately 20 cc of foul-smelling thick, purulent fluid. The aspirated fluid was sent for Gram stain and culture. Follow-up CT imaging demonstrates excellent position of the drainage catheter. The catheter was flushed, connected to JP bulb suction and secured to the skin with 0 Prolene suture. The patient tolerated the procedure well. IMPRESSION: Successful placement of a 10 French transhepatic drainage catheter into the inferior right hepatic abscess. Aspiration yields 20 mL of thick, foul-smelling purulent fluid. Samples were sent for culture. PLAN: 1. Maintain drain to JP bulb suction. 2. Flush drain at least once per shift. 3. Follow cultures and adjust antibiotics accordingly. 4. Surgery aware of the presence of the retained appendicoliths. This will represent a continued nidus for infection/abscess formation until removed. Electronically Signed   By: Jacqulynn Cadet M.D.   On: 12/21/2018 10:15     Results/Tests Pending at Time of Discharge: Repeat blood culture  Discharge Medications:  Allergies as of 12/26/2018   No Known Allergies     Medication List    STOP taking these medications   HUMULIN 70/30 KWIKPEN (70-30) 100 UNIT/ML PEN Generic drug:  Insulin Isophane & Regular Human   metoprolol tartrate 50 MG tablet Commonly known as:  LOPRESSOR     TAKE these medications   amLODipine 10 MG tablet Commonly known as:  NORVASC TAKE 1 TABLET BY MOUTH ONCE DAILY What changed:  how to take this   atorvastatin 40 MG tablet Commonly known as:  LIPITOR TAKE 1 TABLET  BY MOUTH ONCE DAILY What changed:  when to take this   AURYXIA 1 GM 210 MG(Fe) tablet Generic drug:  ferric citrate Take 210-420 mg by mouth See admin instructions. Take 420 mg with meals and 210 mg with snacks   cefTAZidime  IVPB Commonly known as:  FORTAZ Inject 2 g into the vein every Monday, Wednesday, and Friday with hemodialysis. Indication:  Liver abcess and polymicrobial bacteremia Last Day of Therapy:  February 06, 2019 Labs - Once weekly:  CBC/D and BMP, Labs - Every other week:  ESR and CRP   DIALYVITE 800 0.8 MG Tabs Take 1 tablet by mouth daily.   gabapentin 100 MG capsule Commonly known as:  NEURONTIN TAKE 1 CAPSULE BY MOUTH ONCE DAILY AS NEEDED What changed:  See the new instructions.   glucose blood test strip Use as instructed   insulin detemir 100 UNIT/ML injection Commonly known as:  LEVEMIR Inject 0.1 mLs (10 Units total) into the skin daily.   Insulin Pen Needle 32G X 4 MM Misc Commonly known as:  EASY TOUCH PEN NEEDLES 1 each by Does not apply route 3 (three) times daily.   losartan 50 MG tablet Commonly known as:  COZAAR Take 1 tablet (50 mg total) by mouth daily.   metroNIDAZOLE 500 MG tablet Commonly known as:  FLAGYL Take 1 tablet (500 mg total) by mouth every 8 (eight) hours.   mupirocin cream 2 % Commonly known as:  BACTROBAN Apply 1 application topically 2 (two) times daily.   ONE TOUCH DELICA LANCING DEV Misc Check blood sugars once daily   pantoprazole 40 MG tablet Commonly known as:  PROTONIX Take 1 tablet (40 mg total) by mouth daily for 30 days.   Prodigy Autocode  Blood Glucose w/Device Kit 1 Device by Does not apply route 3 (three) times daily.   PRODIGY LANCETS 28G Misc 1 each by Does not apply route 3 (three) times daily.   promethazine 12.5 MG tablet Commonly known as:  PHENERGAN Take 1 tablet (12.5 mg total) by mouth every 6 (six) hours as needed for up to 10 doses for nausea or vomiting.            Home Infusion  Instuctions  (From admission, onward)         Start     Ordered   12/26/18 0000  Home infusion instructions [facility] May follow [place] Dosing Protocol; May administer Cathflo as needed to maintain patency of vascular access device.; Flushing of vascular access device: per [facility] Protocol: 0.9% NaCl pre/post medica...    Question Answer Comment  Instructions May follow [place] Dosing Protocol   Instructions May administer Cathflo as needed to maintain patency of vascular access device.   Instructions Flushing of vascular access device: per [facility] Protocol: 0.9% NaCl pre/post medication administration and prn patency; Heparin 100 u/ml, 29m for implanted ports and Heparin 10u/ml, 540mfor all other central venous catheters.   Instructions May follow [facility] Anaphylaxis Protocol for First Dose Administration in the home: 0.9% NaCl at 25-50 ml/hr to maintain IV access for protocol meds. Epinephrine 0.3 ml IV/IM PRN and Benadryl 25-50 IV/IM PRN s/s of anaphylaxis.   Instructions [facility] Infusion Coordinator (RN) to assist per patient IV care needs in the home PRN.      12/26/18 1255          Discharge Instructions: Please refer to Patient Instructions section of EMR for full details.  Patient was counseled important signs and symptoms that should prompt return to medical care, changes in medications, dietary instructions, activity restrictions, and follow up appointments.   Follow-Up Appointments: Follow-up Information    AnDaisy FloroDO. Schedule an appointment as soon as possible for a visit on 01/02/2019.   Specialty:  Family Medicine Why:  10:05AM (please arrive 15 min early) Contact information: [phone]        Kinsinger, LuArta BruceMD. Go on 01/03/2019.   Specialty:  General Surgery Why:  Follow up appointment scheduled for 10:30 AM. Please arrive 30 min prior to appointment time. Bring photo ID and  insurance information.  Contact information: [address] [place] [phone]        [place] up.   Why:  IR scheduler will call you with appointment date/time. Please call with questions or concerns.  Contact information: [phone]        [facility] Follow up.   Specialty:  Ho[place]ow Parkhy:  RN to manage drain, teach caregiver (MRonalee BeltsContact information: [address] [phone]           OlBenay PikeMD 12/28/2018, 6:24 PM PGY-1, [place]

## 2018-12-22 NOTE — Progress Notes (Signed)
Patient complaining of heartburn. MD paged and made aware. Orders placed and followed. Will continue to monitor.

## 2018-12-22 NOTE — Care Management Important Message (Signed)
Important Message  Patient Details  Name: [name] MRN: [id number] Date of Birth: 20-Apr-1966   Medicare Important Message Given:  Yes    Orbie Pyo 12/22/2018, 3:54 PM

## 2018-12-22 NOTE — Progress Notes (Signed)
Family Medicine Teaching Service Daily Progress Note Intern Pager: [phone]  Patient name: [name] Medical record number: [id number] Date of birth: Aug 29, 1966 Age: 53 y.o. Gender: female  Primary Care Provider: Rory Percy, DO Consultants: General Surgery, IR, Nephro Code Status: Full  Pt Overview and Major Events to Date:  1/14 Admitted FPTS  CT Abd/pelv: multiloculated cystic lesions in inferior liver suggestive of hepatic abscess, post-op edema/inflammation in cecum and ascending colon 1/15 CT guided drainage of liver abscess   Assessment and Plan: 53 year old female with known ESRD, type 2 diabetes, hypertension, blindness, alcohol dependency presenting with 2 days history of nausea vomiting and diarrhea. Discharged on 1/4 s/p lap appendectomy in which "feculent material poured from appendix", received IV Zosyn x 3 days + JP drain, removed prior to d/c.   Liver Abscess  Sepsis  Enterobacter positive Bacteremia:  CT guided placement of 56F drain into hepatic abscess with aspiration of 20 mL of foul-smelling purulent fluid. Fluid culture pending, gram stain significant for gram neg rods, gram pos rods, and gram pos cocci. Retained appendicolith seen adjacent to the abscess, likely source of infection source. Per surgery, will leave drain in for few weeks and follow up with Dr. Kieth Brightly. Will likely need diagnostic laparoscopy with retrieval of retained appendicolith.  Leukocytosis improving, 38.1>23.8>21.2. Patient currently on Cefepime, and Flagyl to complete a 14 day course. Enterobacter positive bacteremia pan sensitive except for Cefazolin. Vitals stable overnight, remained afebrile. Still denies any abdominal pain. Will reach out to surgery concerning retained appendicolith and antibiotic therapy until removed. - General surgery consulted, appreciate recs  - Determine plan going forward for retained appendicolith and antibiotic therapy - IR consulted, appreciate Recs -  Transition from Cefepime to CTX and continue IV flagyl x 14 days (1/15-)  - Plan to switch to PO Flagyl once able to tolerate PO  - Consider IV Ceftazidine after HD treatments   - consider transition to CTX?? 14 day course?? - Tylenol PRN for pain and fever  Nausea/Vomiting/Diarrhea: Nausea, vomiting, and watery diarrhea persisted overnight. Phenergan helps but nausea recurs soon after. C. Diff negative. Denies abdominal pain, nontender to palpation on exam this morning. - Continue to monitor - Obtain GI panel - Scopalamine patch + Phenergan q6 PRN for nausea as QTc mildly prolonged on EKG to 470 - Recheck EKG, if QT normal, switch Phenergan to Zofran - 519mL NS bolus   S/P Laparoscopic appendectomy: stable POD 16. Healing well. Retained appendicolith seen adjacent to the abscess, likely source of infection source. Will likely need diagnostic laparoscopy with retrieval of retained appendicolith.  - General surgery consulted, appreciate recs, continue to follow  ESRD on HD Nephro consulted. HD scheduled for today. Home meds: dialyvite and [place].  -Nephro following, appreciate Recs - cont home dialyvite - cont home auryxia - monitor I/O's  T2DM  Peripheral Neuropathy  On humulin 70/30 BID and Gabapentin for neuropathy.  Last A1c 10/17/18 7.7. CBG range ON: 160-250. - CBG AC/HS - sSSI  Hypokalemia: Resolved K stable at 3.5 today.  - continue to monitor   Tachycardia: resolved HR stable in 80's overnight. On home Metoprolol - Continue to monitor - Continue home meds  Hx Alcohol dependency  Notes no alcohol in over 6 years. CIWA 1 overnight.  - monitor on CIWA protocol  HTN Takes amlodipine, losartain, and lopressor 50mg  BID at home. Normotensive ON.  - cont home BP meds    HLD Takes lipitor at home. LFT's stable.  - cont home lipitor, would  hold if LFTs increase  Blindness Can only see shapes OD, - up with assistance  GERD  Takes protonix 20mg  QD at home. -  cont home protonix  FEN/GI: Thin renal diet with Nepro shake po BID, Protonix, Phenergen PPx: SCDs  Disposition: Pending IR and gen surg recs, continue IV abx  Subjective:  Patient notes continued nausea, vomiting and watery diarrhea overnight. She notes shes feeling a little better this morning, but last night was a little rough. She was in HD this morning and is tolerating this well. She denies any abdominal pain or other concerns or complaints. Has not been able to drink or eat much over the last 24 hours due to symptoms.   Objective: Temp:  [98.7 F (37.1 C)-99.5 F (37.5 C)] 98.8 F (37.1 C) (01/17 0450) Pulse Rate:  [83-95] 83 (01/17 0450) Resp:  [15-20] 20 (01/17 0450) BP: (118-138)/(53-69) 138/67 (01/17 0450) SpO2:  [88 %-100 %] 90 % (01/17 0450) Weight:  [88.3 kg] 88.3 kg (01/16 1954) Physical Exam: General: well nourished, well developed, in no acute distress with non-toxic appearance, lying in HD bed, appears uncomfortable  HEENT: normocephalic, atraumatic, mucous membranes appear a little dry this AM, blind at baseline Neck: supple, normal ROM CV: regular rate and rhythm, 2/6 systolic murmur heard best at left lower sternal border, no lower extremity edema, 2+ radial and pedal pulses bilaterally Lungs: clear to auscultation bilaterally with normal work of breathing Abdomen: soft, non-tender to palpation, non-distended, normoactive bowel sounds, healing surgical wound superior to umbilicus with mild epidermal separation, no surrounding erythema, bandage for drain is clean and intact Skin: warm, dry, no rashes or lesions Extremities: warm and well perfused, Right BKA , 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 Neuro: Alert and oriented, speech normal   Laboratory: Recent Labs  Lab 12/20/18 0630 12/21/18 0714 12/22/18 0350  WBC 38.1* 23.8* 21.2*  HGB 8.6* 8.1* 8.2*  HCT 28.8* 26.8* 27.2*  PLT 391 322 360   Recent Labs  Lab 12/19/18 1932 12/20/18 0630 12/21/18 0714  12/22/18 0350  NA 139 136 139 138  K 3.8 3.7 3.4* 3.5  CL 90* 92* 99 99  CO2 24 27 22  21*  BUN 38* 44* 34* 46*  CREATININE 8.23* 9.19* 7.76* 9.44*  CALCIUM 8.9 7.9* 8.0* 8.0*  PROT 8.2* 6.6 6.2*  --   BILITOT 1.3* 0.8 1.4*  --   ALKPHOS 115 95 87  --   ALT 25 37 39  --   AST 47* 78* 77*  --   GLUCOSE 199* 250* 153* 169*    Lactic acid: 1.71.82 Blood culture: pending Blood cx ID: Enterobacter cloacae complex HcG: neg FOBT: neg Trop: 0.01 Flu: neg Lipase: 60   Imaging/Diagnostic Tests: Dg Chest 2 View  Result Date: 12/19/2018 CLINICAL DATA:  Nausea, vomiting, and diarrhea. Generalized body aches over the last day. History of hypertension and dialysis. EXAM: CHEST - 2 VIEW COMPARISON:  11/16/2017 FINDINGS: Mild cardiac enlargement. Linear atelectasis in the lung bases. No airspace disease or consolidation in the lungs. No blunting of costophrenic angles. No pneumothorax. Mediastinal contours appear intact. Degenerative changes in the shoulders. IMPRESSION: Mild cardiac enlargement. Linear atelectasis in the lung bases. No evidence of active pulmonary disease. Electronically Signed   By: Lucienne Capers M.D.   On: 12/19/2018 21:42   Ct Abdomen Pelvis W Contrast  Addendum Date: 12/21/2018   ADDENDUM REPORT: 12/21/2018 10:40 ADDENDUM: There is an approximately 8-9 mm radiopaque stone adjacent to the inferior tip of the  liver with a surrounding fluid collection. This is contiguous with intrahepatic abscess in hepatic segment 6. This stone was not present on the prior examination when the patient was diagnosed with acute appendicitis with numerous appendicoliths. This almost certainly represents a retained appendicolith which is the source of the patient's intrahepatic abscess. These results were called by telephone at the time of interpretation on 12/21/2018 to [facility], [place] , who verbally acknowledged these results. Electronically Signed   By: Jacqulynn Cadet M.D.   On: 12/21/2018  10:40   Result Date: 12/21/2018 CLINICAL DATA:  Nausea, vomiting, and diarrhea. Generalized body aches for 1 day. Dialysis for 3 years. Appendectomy on 12/06/2018. EXAM: CT ABDOMEN AND PELVIS WITH CONTRAST TECHNIQUE: Multidetector CT imaging of the abdomen and pelvis was performed using the standard protocol following bolus administration of intravenous contrast. CONTRAST:  124mL OMNIPAQUE IOHEXOL 300 MG/ML  SOLN COMPARISON:  12/06/2018 FINDINGS: Lower chest: Linear atelectasis in the lung bases. Hepatobiliary: Lobulated circumscribed cystic lesions demonstrated in the inferior liver, new since previous study, suggesting hepatic abscess. Largest lesion measures about 3.7 cm diameter. Gallbladder and bile ducts are unremarkable. Pancreas: Unremarkable. No pancreatic ductal dilatation or surrounding inflammatory changes. Spleen: Normal in size without focal abnormality. Adrenals/Urinary Tract: Adrenal glands are unremarkable. Kidneys are normal, without renal calculi, focal lesion, or hydronephrosis. Bladder is unremarkable. Stomach/Bowel: Stomach, small bowel, and colon are mostly decompressed, limiting evaluation of the wall. In the right lower quadrant, there is evidence of infiltration in the right lower quadrant fat with some wall thickening suggested in the cecum and ascending colon. Minimal soft tissue edema without definite loculation. This may represent postoperative change or residual inflammatory changes from previous appendicitis. Recurrent inflammatory process could also be present. Vascular/Lymphatic: No significant vascular findings are present. No enlarged abdominal or pelvic lymph nodes. There a few scattered prominent lymph nodes in the right lower quadrant, likely reactive. Reproductive: Uterus and bilateral adnexa are unremarkable. Other: No free air in the abdomen. Peritoneal nodule along the right lower quadrant anteriorly measuring 1.4 cm diameter, not present previously. This could represent  postoperative changes a suture granuloma or postoperative inflammatory reaction. Musculoskeletal: No acute or significant osseous findings. IMPRESSION: 1. Multiloculated cystic lesions demonstrated in the inferior liver, new since previous study, suggesting hepatic abscess. 2. Postoperative and/or inflammatory stranding and edema throughout the right lower quadrant fat with associated colonic wall thickening in the cecum and ascending colon. 3. Peritoneal nodule along the right lower quadrant anteriorly may represent postoperative changes or postoperative inflammatory reaction. Electronically Signed: By: Lucienne Capers M.D. On: 12/19/2018 22:44   Ct Image Guided Drainage By Percutaneous Catheter  Result Date: 12/21/2018 INDICATION: 53 year old female with a history of gangrenous acute appendicitis status post appendectomy. Recent CT imaging demonstrates and intrahepatic abscess with a small stone adjacent to the liver. The stone was not present on prior imaging and almost certainly represents a retained appendicolith which likely represents the source of the patient's hepatic abscess. She presents for CT-guided drainage of the abscess cavity. EXAM: CT-guided drain placement MEDICATIONS: The patient is currently admitted to the hospital and receiving intravenous antibiotics. The antibiotics were administered within an appropriate time frame prior to the initiation of the procedure. Additionally, 4 mg Zofran were administered intravenously for nausea. ANESTHESIA/SEDATION: Fentanyl 1 mcg IV; Versed 50 mg IV. Moderate Sedation Time:  21 minutes The patient was continuously monitored during the procedure by the interventional radiology nurse under my direct supervision. COMPLICATIONS: None immediate. PROCEDURE: Informed written consent was obtained  from the patient after a thorough discussion of the procedural risks, benefits and alternatives. All questions were addressed. Maximal Sterile Barrier Technique was  utilized including caps, mask, sterile gowns, sterile gloves, sterile drape, hand hygiene and skin antiseptic. A timeout was performed prior to the initiation of the procedure. A planning CT scan was performed. The low-attenuation collection in the inferior aspect of hepatic segment 6 is easily visualized. The small appendicoliths is again visualized. A suitable skin entry site was selected and marked. After the skin was sterilely prepped and draped in the standard fashion using chlorhexidine skin prep, local anesthesia was attained by infiltration with 1% lidocaine. A small dermatotomy was made. Under intermittent CT guidance, an 18 gauge trocar needle was advanced through the liver and into the fluid collection. A 0.035 wire was then coiled in the complex collection. The needle was removed. The skin and transhepatic tract were dilated to 10 [place] and a Cook 10.2 [place] all-purpose drainage catheter was advanced over the wire and formed. Aspiration yields approximately 20 cc of foul-smelling thick, purulent fluid. The aspirated fluid was sent for Gram stain and culture. Follow-up CT imaging demonstrates excellent position of the drainage catheter. The catheter was flushed, connected to JP bulb suction and secured to the skin with 0 Prolene suture. The patient tolerated the procedure well. IMPRESSION: Successful placement of a 10 French transhepatic drainage catheter into the inferior right hepatic abscess. Aspiration yields 20 mL of thick, foul-smelling purulent fluid. Samples were sent for culture. PLAN: 1. Maintain drain to JP bulb suction. 2. Flush drain at least once per shift. 3. Follow cultures and adjust antibiotics accordingly. 4. Surgery aware of the presence of the retained appendicoliths. This will represent a continued nidus for infection/abscess formation until removed. Electronically Signed   By: Jacqulynn Cadet M.D.   On: 12/21/2018 10:15    Danna Hefty, DO 12/22/2018, 7:42 AM PGY-1,  [place] Intern pager: [phone], text pages welcome

## 2018-12-22 NOTE — Progress Notes (Signed)
[facility] Progress Note   Dialysis Orders: MWF -East 3.75hrs, [id number], DFRAF 1.5, EDW 90.5kg,2K/2Ca  Access:LU [id number] units Mircera18mcg q2wks - last 11/26/18 Calcitriol68mcg PO qHD  Assessment/Plan: 1. Liver Abscess/eterobacter bacteremia/sepsis- Tmax 99.5. WBC dec 38.1>23.8.On vanc cefepime and flagyl. IR drained abscess this AM, with aspiration of 32mL foul smelling purulent sample & noted a free appendicolith as source.  Cultures > mult species - pending. Surgery consulting- think she will eventually need more surgery.     2. N/v/d - likely related to #2.  Ongoing. Meds per primary. 3. ESRD - On HD MWF. K 3.5 - use 4 K bath today - relative low K due to poor intake and diarrhea 4. Anemia of CKD - Hgb 8.2. Aranesp 100 to be gi en today 5. Secondary Hyperparathyroidism - CCa in goal. Phos elevated.  Continues VDRA - hold binders for a couple days and resume on Monday due to N, V and diarrhea and poor intake. Has been on 2 Aurixya ac 6. Nutrition - Renal diet w/fluid restrictions. Protein supplements. Renal vitamins. Alb 2 with very poor intake 7.    DMT2 - per primary 8.    HTN/volume - BP/volume status ok - likely losing dry weight due to poor intake  [facility] Beeper [phone] 12/22/2018,8:18 AM  LOS: 2 days   Subjective:   Continues with N and V but no pain.  Also ongoing diarrhea - happening during HD today. Only kept down sherbet yesterday.  Objective Vitals:   12/21/18 0959 12/21/18 1752 12/21/18 1954 12/22/18 0450  BP: 136/62 (!) 120/58 135/60 138/67  Pulse: 94 90 87 83  Resp: 18 18 18 20   Temp: 98.7 F (37.1 C) 99.5 F (37.5 C) 99.3 F (37.4 C) 98.8 F (37.1 C)  TempSrc: Oral Oral Oral Oral  SpO2: 97% (!) 88% 91% 90%  Weight:   88.3 kg   Height:       Physical Exam on HD goal goal 1.5 General: supine on HD holding emesis bag Heart: RRR Lungs: clear anteriotly Abdomen: soft NT  + BS  Extremities:no LLE or  right BKA edema Dialysis Access: left upper AVF Qb 400   Additional Objective Labs: Basic Metabolic Panel: Recent Labs  Lab 12/20/18 0630 12/20/18 1330 12/21/18 0714 12/22/18 0350  NA 136  --  139 138  K 3.7  --  3.4* 3.5  CL 92*  --  99 99  CO2 27  --  22 21*  GLUCOSE 250*  --  153* 169*  BUN 44*  --  34* 46*  CREATININE 9.19*  --  7.76* 9.44*  CALCIUM 7.9*  --  8.0* 8.0*  PHOS  --  6.7*  --   --    Liver Function Tests: Recent Labs  Lab 12/19/18 1932 12/20/18 0630 12/21/18 0714  AST 47* 78* 77*  ALT 25 37 39  ALKPHOS 115 95 87  BILITOT 1.3* 0.8 1.4*  PROT 8.2* 6.6 6.2*  ALBUMIN 2.8* 2.2* 2.0*   Recent Labs  Lab 12/19/18 1932  LIPASE 60*   CBC: Recent Labs  Lab 12/19/18 1932 12/20/18 0630 12/21/18 0714 12/22/18 0350  WBC 23.8* 38.1* 23.8* 21.2*  HGB 10.6* 8.6* 8.1* 8.2*  HCT 35.1* 28.8* 26.8* 27.2*  MCV 75.6* 75.0* 74.2* 74.5*  PLT 394 391 322 360   Blood Culture    Component Value Date/Time   SDES ABSCESS LIVER 12/21/2018 1015   SPECREQUEST Normal 12/21/2018 1015   CULT PENDING  12/21/2018 1015   REPTSTATUS PENDING 12/21/2018 1015    Cardiac Enzymes: No results for input(s): CKTOTAL, CKMB, CKMBINDEX, TROPONINI in the last 168 hours. CBG: Recent Labs  Lab 12/21/18 0659 12/21/18 0744 12/21/18 1153 12/21/18 1651 12/21/18 1951  GLUCAP 130* 146* 188* 218* 212*   Iron Studies: No results for input(s): IRON, TIBC, TRANSFERRIN, FERRITIN in the last 72 hours. Lab Results  Component Value Date   INR 1.23 12/20/2018   INR 1.10 08/22/2011   Studies/Results: Ct Image Guided Drainage By Percutaneous Catheter  Result Date: 12/21/2018 INDICATION: 52 year old female with a history of gangrenous acute appendicitis status post appendectomy. Recent CT imaging demonstrates and intrahepatic abscess with a small stone adjacent to the liver. The stone was not present on prior imaging and almost certainly represents a retained  appendicolith which likely represents the source of the patient's hepatic abscess. She presents for CT-guided drainage of the abscess cavity. EXAM: CT-guided drain placement MEDICATIONS: The patient is currently admitted to the hospital and receiving intravenous antibiotics. The antibiotics were administered within an appropriate time frame prior to the initiation of the procedure. Additionally, 4 mg Zofran were administered intravenously for nausea. ANESTHESIA/SEDATION: Fentanyl 1 mcg IV; Versed 50 mg IV. Moderate Sedation Time:  21 minutes The patient was continuously monitored during the procedure by the interventional radiology nurse under my direct supervision. COMPLICATIONS: None immediate. PROCEDURE: Informed written consent was obtained from the patient after a thorough discussion of the procedural risks, benefits and alternatives. All questions were addressed. Maximal Sterile Barrier Technique was utilized including caps, mask, sterile gowns, sterile gloves, sterile drape, hand hygiene and skin antiseptic. A timeout was performed prior to the initiation of the procedure. A planning CT scan was performed. The low-attenuation collection in the inferior aspect of hepatic segment 6 is easily visualized. The small appendicoliths is again visualized. A suitable skin entry site was selected and marked. After the skin was sterilely prepped and draped in the standard fashion using chlorhexidine skin prep, local anesthesia was attained by infiltration with 1% lidocaine. A small dermatotomy was made. Under intermittent CT guidance, an 18 gauge trocar needle was advanced through the liver and into the fluid collection. A 0.035 wire was then coiled in the complex collection. The needle was removed. The skin and transhepatic tract were dilated to 10 [place] and a Cook 10.2 [place] all-purpose drainage catheter was advanced over the wire and formed. Aspiration yields approximately 20 cc of foul-smelling thick, purulent  fluid. The aspirated fluid was sent for Gram stain and culture. Follow-up CT imaging demonstrates excellent position of the drainage catheter. The catheter was flushed, connected to JP bulb suction and secured to the skin with 0 Prolene suture. The patient tolerated the procedure well. IMPRESSION: Successful placement of a 10 French transhepatic drainage catheter into the inferior right hepatic abscess. Aspiration yields 20 mL of thick, foul-smelling purulent fluid. Samples were sent for culture. PLAN: 1. Maintain drain to JP bulb suction. 2. Flush drain at least once per shift. 3. Follow cultures and adjust antibiotics accordingly. 4. Surgery aware of the presence of the retained appendicoliths. This will represent a continued nidus for infection/abscess formation until removed. Electronically Signed   By: Jacqulynn Cadet M.D.   On: 12/21/2018 10:15   Medications: . sodium chloride 10 mL/hr at 12/21/18 1035  . ceFEPime (MAXIPIME) IV 1 g (12/21/18 2124)  . metronidazole 500 mg (12/21/18 2334)   . heparin      . amLODipine  10 mg Oral Daily  .  atorvastatin  40 mg Oral Daily  . calcitRIOL  1 mcg Oral Daily  . Chlorhexidine Gluconate Cloth  6 each Topical Q0600  . darbepoetin (ARANESP) injection - DIALYSIS  100 mcg Intravenous Q Fri-HD  . feeding supplement (NEPRO CARB STEADY)  237 mL Oral BID BM  . ferric citrate  420 mg Oral TID WC  . insulin aspart  0-9 Units Subcutaneous TID WC  . losartan  50 mg Oral Daily  . metoprolol tartrate  50 mg Oral BID  . multivitamin  1 tablet Oral QHS  . sodium chloride flush  5 mL Intracatheter Q8H

## 2018-12-22 NOTE — Progress Notes (Signed)
Patient ID: [name], female   DOB: 10/04/1966, 53 y.o.   MRN: [id number]       Subjective: Patient having copious diarrhea.  That's her biggest complaint.  She denies abdominal pain. She is still having some nausea apparently.  Objective: Vital signs in last 24 hours: Temp:  [98.4 F (36.9 C)-99.5 F (37.5 C)] 98.4 F (36.9 C) (01/17 0735) Pulse Rate:  [83-90] 87 (01/17 0930) Resp:  [18-20] 20 (01/17 0735) BP: (115-159)/(58-78) 143/76 (01/17 0930) SpO2:  [88 %-96 %] 96 % (01/17 0735) Weight:  [88.3 kg-89.4 kg] 89.4 kg (01/17 0735) Last BM Date: 12/21/18  Intake/Output from previous day: 01/16 0701 - 01/17 0700 In: 403.7 [I.V.:213.5; IV Piggyback:190.2] Out: 110 [Drains:110] Intake/Output this shift: No intake/output data recorded.  PE: Abd: soft, NT, ND, +BS, having diarrhea while I'm present.  JP drain from liver collection with just serous output today.  110cc out since placement yesterday.  Lab Results:  Recent Labs    12/21/18 0714 12/22/18 0350  WBC 23.8* 21.2*  HGB 8.1* 8.2*  HCT 26.8* 27.2*  PLT 322 360   BMET Recent Labs    12/21/18 0714 12/22/18 0350  NA 139 138  K 3.4* 3.5  CL 99 99  CO2 22 21*  GLUCOSE 153* 169*  BUN 34* 46*  CREATININE 7.76* 9.44*  CALCIUM 8.0* 8.0*   PT/INR Recent Labs    12/20/18 0019  LABPROT 15.3*  INR 1.23   CMP     Component Value Date/Time   NA 138 12/22/2018 0350   NA 139 09/12/2013 0736   K 3.5 12/22/2018 0350   K 5.2 (H) 11/07/2013 0802   CL 99 12/22/2018 0350   CL 111 (H) 09/12/2013 0736   CO2 21 (L) 12/22/2018 0350   CO2 21 09/12/2013 0736   GLUCOSE 169 (H) 12/22/2018 0350   GLUCOSE 181 (H) 09/12/2013 0736   BUN 46 (H) 12/22/2018 0350   BUN 35 (H) 09/12/2013 0736   CREATININE 9.44 (H) 12/22/2018 0350   CREATININE 1.55 (H) 09/12/2013 0736   CREATININE 0.94 09/12/2012 1113   CALCIUM 8.0 (L) 12/22/2018 0350   CALCIUM 7.6 (L) 03/31/2016 1101   PROT 6.2 (L) 12/21/2018 0714   ALBUMIN 2.0 (L)  12/21/2018 0714   AST 77 (H) 12/21/2018 0714   ALT 39 12/21/2018 0714   ALKPHOS 87 12/21/2018 0714   BILITOT 1.4 (H) 12/21/2018 0714   GFRNONAA 4 (L) 12/22/2018 0350   GFRNONAA 40 (L) 09/12/2013 0736   GFRNONAA 74 09/12/2012 1113   GFRAA 5 (L) 12/22/2018 0350   GFRAA 46 (L) 09/12/2013 0736   GFRAA 85 09/12/2012 1113   Lipase     Component Value Date/Time   LIPASE 60 (H) 12/19/2018 1932       Studies/Results: Ct Image Guided Drainage By Percutaneous Catheter  Result Date: 12/21/2018 INDICATION: 53 year old female with a history of gangrenous acute appendicitis status post appendectomy. Recent CT imaging demonstrates and intrahepatic abscess with a small stone adjacent to the liver. The stone was not present on prior imaging and almost certainly represents a retained appendicolith which likely represents the source of the patient's hepatic abscess. She presents for CT-guided drainage of the abscess cavity. EXAM: CT-guided drain placement MEDICATIONS: The patient is currently admitted to the hospital and receiving intravenous antibiotics. The antibiotics were administered within an appropriate time frame prior to the initiation of the procedure. Additionally, 4 mg Zofran were administered intravenously for nausea. ANESTHESIA/SEDATION: Fentanyl 1 mcg IV; Versed 50  mg IV. Moderate Sedation Time:  21 minutes The patient was continuously monitored during the procedure by the interventional radiology nurse under my direct supervision. COMPLICATIONS: None immediate. PROCEDURE: Informed written consent was obtained from the patient after a thorough discussion of the procedural risks, benefits and alternatives. All questions were addressed. Maximal Sterile Barrier Technique was utilized including caps, mask, sterile gowns, sterile gloves, sterile drape, hand hygiene and skin antiseptic. A timeout was performed prior to the initiation of the procedure. A planning CT scan was performed. The  low-attenuation collection in the inferior aspect of hepatic segment 6 is easily visualized. The small appendicoliths is again visualized. A suitable skin entry site was selected and marked. After the skin was sterilely prepped and draped in the standard fashion using chlorhexidine skin prep, local anesthesia was attained by infiltration with 1% lidocaine. A small dermatotomy was made. Under intermittent CT guidance, an 18 gauge trocar needle was advanced through the liver and into the fluid collection. A 0.035 wire was then coiled in the complex collection. The needle was removed. The skin and transhepatic tract were dilated to 10 [place] and a Cook 10.2 [place] all-purpose drainage catheter was advanced over the wire and formed. Aspiration yields approximately 20 cc of foul-smelling thick, purulent fluid. The aspirated fluid was sent for Gram stain and culture. Follow-up CT imaging demonstrates excellent position of the drainage catheter. The catheter was flushed, connected to JP bulb suction and secured to the skin with 0 Prolene suture. The patient tolerated the procedure well. IMPRESSION: Successful placement of a 10 French transhepatic drainage catheter into the inferior right hepatic abscess. Aspiration yields 20 mL of thick, foul-smelling purulent fluid. Samples were sent for culture. PLAN: 1. Maintain drain to JP bulb suction. 2. Flush drain at least once per shift. 3. Follow cultures and adjust antibiotics accordingly. 4. Surgery aware of the presence of the retained appendicoliths. This will represent a continued nidus for infection/abscess formation until removed. Electronically Signed   By: Jacqulynn Cadet M.D.   On: 12/21/2018 10:15    Anti-infectives: Anti-infectives (From admission, onward)   Start     Dose/Rate Route Frequency Ordered Stop   12/20/18 2200  ceFEPIme (MAXIPIME) 1 g in sodium chloride 0.9 % 100 mL IVPB     1 g 200 mL/hr over 30 Minutes Intravenous Every 24 hours 12/20/18 0126      12/20/18 1200  vancomycin (VANCOCIN) IVPB 1000 mg/200 mL premix  Status:  Discontinued     1,000 mg 200 mL/hr over 60 Minutes Intravenous Every M-W-F (Hemodialysis) 12/20/18 0126 12/21/18 1132   12/19/18 2330  metroNIDAZOLE (FLAGYL) IVPB 500 mg     500 mg 100 mL/hr over 60 Minutes Intravenous Every 8 hours 12/19/18 2320     12/19/18 2330  ceFEPIme (MAXIPIME) 2 g in sodium chloride 0.9 % 100 mL IVPB     2 g 200 mL/hr over 30 Minutes Intravenous  Once 12/19/18 2324 12/20/18 0053   12/19/18 2330  vancomycin (VANCOCIN) 2,000 mg in sodium chloride 0.9 % 500 mL IVPB     2,000 mg 250 mL/hr over 120 Minutes Intravenous  Once 12/19/18 2324 12/20/18 0407       Assessment/Plan ESRD DM ETOH  HTN HLD  Leukocytosis - WBC down to 21K today, AF Nausea/vomiting/diarrhea- nausea still present, but no vomiting.  Diarrhea still persistent, C diff negative.  [place] for imodium from my standpoint, as long as ok with renal and medicine Liver abscesses - s/p IR drain placement on 1/16.  Leave drain for now, will need drain clinic follow up as outpatient.  s/p lap appy for gangrenous appendicitis1/1/20 Dr. Kieth Brightly - POD 16 - no abdominal pain on my exam - incisions c/d/i  - residual appendicolith noted on CT and likely source of hepatic abscess.  Patient has follow up set up with Dr. Kieth Brightly as she will likely require dx lap in the future to remove this appendicolith to fully resolve this problem. -no surgery planned this admission  FEN - reg VTE -SCDs ID -maxipime/flagyl/vanc 1/14>>   LOS: 2 days    Henreitta Cea , [facility] 12/22/2018, 10:02 AM Pager: [phone]

## 2018-12-23 DIAGNOSIS — R1110 Vomiting, unspecified: Secondary | ICD-10-CM

## 2018-12-23 DIAGNOSIS — R112 Nausea with vomiting, unspecified: Secondary | ICD-10-CM

## 2018-12-23 LAB — CULTURE, BLOOD (ROUTINE X 2): Special Requests: ADEQUATE

## 2018-12-23 LAB — GLUCOSE, CAPILLARY
Glucose-Capillary: 198 mg/dL — ABNORMAL HIGH (ref 70–99)
Glucose-Capillary: 229 mg/dL — ABNORMAL HIGH (ref 70–99)
Glucose-Capillary: 226 mg/dL — ABNORMAL HIGH (ref 70–99)
Glucose-Capillary: 256 mg/dL — ABNORMAL HIGH (ref 70–99)
Glucose-Capillary: 228 mg/dL — ABNORMAL HIGH (ref 70–99)

## 2018-12-23 LAB — CBC
HCT: 28.1 % — ABNORMAL LOW (ref 36.0–46.0)
Hemoglobin: 8.7 g/dL — ABNORMAL LOW (ref 12.0–15.0)
MCH: 23.3 pg — ABNORMAL LOW (ref 26.0–34.0)
MCHC: 31 g/dL (ref 30.0–36.0)
MCV: 75.1 fL — ABNORMAL LOW (ref 80.0–100.0)
Platelets: 183 10*3/uL (ref 150–400)
RBC: 3.74 MIL/uL — ABNORMAL LOW (ref 3.87–5.11)
RDW: 18.7 % — ABNORMAL HIGH (ref 11.5–15.5)
WBC: 22.5 10*3/uL — ABNORMAL HIGH (ref 4.0–10.5)
nRBC: 0 % (ref 0.0–0.2)

## 2018-12-23 MED ORDER — CHLORHEXIDINE GLUCONATE CLOTH 2 % EX PADS
6.0000 | MEDICATED_PAD | Freq: Every day | CUTANEOUS | Status: DC
  Administered 2018-12-23: 6 via TOPICAL

## 2018-12-23 NOTE — Progress Notes (Signed)
Patient ID: [name], female   DOB: December 12, 1965, 53 y.o.   MRN: [id number]       Subjective: Pt states she feels maybe slightly better today.  Diarrhea is slightly less frequent that yesterday.  Still with nausea and not eating much at all secondary to this.  Objective: Vital signs in last 24 hours: Temp:  [98.3 F (36.8 C)-99.9 F (37.7 C)] 99.1 F (37.3 C) (01/18 1042) Pulse Rate:  [86-93] 86 (01/18 1042) Resp:  [16-19] 16 (01/18 1042) BP: (134-151)/(58-75) 142/66 (01/18 1042) SpO2:  [93 %-99 %] 95 % (01/18 1042) Weight:  [88.3 kg] 88.3 kg (01/17 1136) Last BM Date: 12/22/18  Intake/Output from previous day: 01/17 0701 - 01/18 0700 In: 1212.9 [P.O.:360; I.V.:141.5; IV Piggyback:711.4] Out: 1023 [Drains:30] Intake/Output this shift: Total I/O In: 300 [IV Piggyback:300] Out: 100 [Emesis/NG output:100]  PE: Abd: soft, NT, ND, +BS, JP drain with slightly cloudy serosang output. 30cc documented yesterday.  Lab Results:  Recent Labs    12/22/18 0350 12/23/18 0533  WBC 21.2* 22.5*  HGB 8.2* 8.7*  HCT 27.2* 28.1*  PLT 360 183   BMET Recent Labs    12/21/18 0714 12/22/18 0350  NA 139 138  K 3.4* 3.5  CL 99 99  CO2 22 21*  GLUCOSE 153* 169*  BUN 34* 46*  CREATININE 7.76* 9.44*  CALCIUM 8.0* 8.0*   PT/INR No results for input(s): LABPROT, INR in the last 72 hours. CMP     Component Value Date/Time   NA 138 12/22/2018 0350   NA 139 09/12/2013 0736   K 3.5 12/22/2018 0350   K 5.2 (H) 11/07/2013 0802   CL 99 12/22/2018 0350   CL 111 (H) 09/12/2013 0736   CO2 21 (L) 12/22/2018 0350   CO2 21 09/12/2013 0736   GLUCOSE 169 (H) 12/22/2018 0350   GLUCOSE 181 (H) 09/12/2013 0736   BUN 46 (H) 12/22/2018 0350   BUN 35 (H) 09/12/2013 0736   CREATININE 9.44 (H) 12/22/2018 0350   CREATININE 1.55 (H) 09/12/2013 0736   CREATININE 0.94 09/12/2012 1113   CALCIUM 8.0 (L) 12/22/2018 0350   CALCIUM 7.6 (L) 03/31/2016 1101   PROT 6.2 (L) 12/21/2018 0714   ALBUMIN  2.0 (L) 12/21/2018 0714   AST 77 (H) 12/21/2018 0714   ALT 39 12/21/2018 0714   ALKPHOS 87 12/21/2018 0714   BILITOT 1.4 (H) 12/21/2018 0714   GFRNONAA 4 (L) 12/22/2018 0350   GFRNONAA 40 (L) 09/12/2013 0736   GFRNONAA 74 09/12/2012 1113   GFRAA 5 (L) 12/22/2018 0350   GFRAA 46 (L) 09/12/2013 0736   GFRAA 85 09/12/2012 1113   Lipase     Component Value Date/Time   LIPASE 60 (H) 12/19/2018 1932       Studies/Results: No results found.  Anti-infectives: Anti-infectives (From admission, onward)   Start     Dose/Rate Route Frequency Ordered Stop   12/22/18 2200  cefTRIAXone (ROCEPHIN) 2 g in sodium chloride 0.9 % 100 mL IVPB     2 g 200 mL/hr over 30 Minutes Intravenous Every 24 hours 12/22/18 1044     12/20/18 2200  ceFEPIme (MAXIPIME) 1 g in sodium chloride 0.9 % 100 mL IVPB  Status:  Discontinued     1 g 200 mL/hr over 30 Minutes Intravenous Every 24 hours 12/20/18 0126 12/22/18 1044   12/20/18 1200  vancomycin (VANCOCIN) IVPB 1000 mg/200 mL premix  Status:  Discontinued     1,000 mg 200 mL/hr over 60 Minutes  Intravenous Every M-W-F (Hemodialysis) 12/20/18 0126 12/21/18 1132   12/19/18 2330  metroNIDAZOLE (FLAGYL) IVPB 500 mg     500 mg 100 mL/hr over 60 Minutes Intravenous Every 8 hours 12/19/18 2320     12/19/18 2330  ceFEPIme (MAXIPIME) 2 g in sodium chloride 0.9 % 100 mL IVPB     2 g 200 mL/hr over 30 Minutes Intravenous  Once 12/19/18 2324 12/20/18 0053   12/19/18 2330  vancomycin (VANCOCIN) 2,000 mg in sodium chloride 0.9 % 500 mL IVPB     2,000 mg 250 mL/hr over 120 Minutes Intravenous  Once 12/19/18 2324 12/20/18 0407       Assessment/Plan ESRD DM ETOH  HTN HLD  Leukocytosis - 22K today, stable Nausea/vomiting/diarrhea -diarrhea slightly improved per patient, but still present -nausea still present.  Renal holding binders Liver abscesses -s/p IR drain placement on 1/16.  Leave drain for now, will need drain clinic follow up as outpatient. -cx  shows gram negative rods Bacteremia CX show ENTEROBACTER SPECIES and KLEBSIELLA OXYTOCA  -per medicine  POD 17, s/p lap appy for gangrenous appendicitis1/1/20 Dr. Kieth Brightly - no abdominal pain on my exam - incisions c/d/i - residual appendicolith noted on CT and likely source of hepatic abscess.  Patient has follow up set up with Dr. Kieth Brightly as she will likely require dx lap in the future to remove this appendicolith to fully resolve this problem. -no surgery planned this admission  FEN -renal diet as able VTE -SCDs ID -maxipime/flagyl/vanc 1/14>>1/17  Rocephin/Flagyl 1/17 -->   LOS: 3 days    Henreitta Cea , [facility] 12/23/2018, 11:29 AM Pager: [phone]

## 2018-12-23 NOTE — Progress Notes (Signed)
[facility] Progress Note  Dialysis Orders: MWF -[facility] 3.75hrs, [id number], DFRAF 1.5, EDW 90.5kg,2K/2Ca  Access:LU [id number] units Mircera122mcg q2wks - last 11/26/18 Calcitriol59mcg PO qHD  Assessment/Plan: 1. Liver Abscess/eterobacter bacteremia/sepsis; s/p lap appy for gangrenous appendicitis 12/06/18; residual appendicolith noted on CT likely source of hepatic abscess. Per surgery will likely require dx lap in the future to remove this - no surgery planned this admission.- Tmax 99.9.WBCdec 38.1>23.8> 22.5 .Onvanc cefepime and flagyl . S/p IR drained abscess  2. N/v/d - likely related to #1. Ongoing. Meds per primary. 3. ESRD - On HD MWF.requiring added K bath - lower edw for DC- next HD Monday 4. Anemia of CKD - Hgb 8.2> 8.7 .Aranesp 100 to be gi en today 5. Secondary Hyperparathyroidism - CCa in goal.Phos elevated. Continues VDRA - hold binders for a couple days and resume on Monday due to N, V and diarrhea and poor intake. Has been on 2 Aurixya ac 6. Nutrition - Renal diet w/fluid restrictions. Asking for nepro - hasn't been getting likely due to N,V- will try . Renal vitamins. Alb 2 with very poor intake 7.   DMT2 - per primary 8.   HTN/volume - BP/volume status ok  Net UF 993 ml Friday post wt 88.3 likely losing dry weight due to poor intake  [place] [phone] 12/23/2018,9:04 AM  LOS: 3 days   Subjective:   Drank a little coffee this am - no solid foods. Still with nausea, loose stools that are uncontrollable.   Objective Vitals:   12/22/18 1211 12/22/18 1658 12/22/18 2039 12/23/18 0408  BP: 140/64 134/67 (!) 143/67 (!) 142/67  Pulse: 92 93 92 89  Resp: 18 17 18 18   Temp: 99 F (37.2 C) 99.8 F (37.7 C) 99.9 F (37.7 C) 99 F (37.2 C)  TempSrc: Oral Oral Oral Oral  SpO2: 99% 93% 99% 96%  Weight:      Height:       Physical Exam General: NAD but nauseated holding emesis  bag Heart: RRR Lungs: grossly clear Abdomen: spft + BS drain on right intact Extremities: no LE edema right BKA Dialysis Access: left upper AVF + bruit   Additional Objective Labs: Basic Metabolic Panel: Recent Labs  Lab 12/20/18 0630 12/20/18 1330 12/21/18 0714 12/22/18 0350  NA 136  --  139 138  K 3.7  --  3.4* 3.5  CL 92*  --  99 99  CO2 27  --  22 21*  GLUCOSE 250*  --  153* 169*  BUN 44*  --  34* 46*  CREATININE 9.19*  --  7.76* 9.44*  CALCIUM 7.9*  --  8.0* 8.0*  PHOS  --  6.7*  --   --    Liver Function Tests: Recent Labs  Lab 12/19/18 1932 12/20/18 0630 12/21/18 0714  AST 47* 78* 77*  ALT 25 37 39  ALKPHOS 115 95 87  BILITOT 1.3* 0.8 1.4*  PROT 8.2* 6.6 6.2*  ALBUMIN 2.8* 2.2* 2.0*   Recent Labs  Lab 12/19/18 1932  LIPASE 60*   CBC: Recent Labs  Lab 12/19/18 1932 12/20/18 0630 12/21/18 0714 12/22/18 0350 12/23/18 0533  WBC 23.8* 38.1* 23.8* 21.2* 22.5*  HGB 10.6* 8.6* 8.1* 8.2* 8.7*  HCT 35.1* 28.8* 26.8* 27.2* 28.1*  MCV 75.6* 75.0* 74.2* 74.5* 75.1*  PLT 394 391 322 360 183   Blood Culture    Component Value Date/Time   SDES ABSCESS LIVER 12/21/2018 1015  SPECREQUEST Normal 12/21/2018 1015   CULT  12/21/2018 1015    MODERATE GRAM NEGATIVE RODS IDENTIFICATION AND SUSCEPTIBILITIES TO FOLLOW Performed at [facility], [place] [address], [place], [place] [address]    REPTSTATUS PENDING 12/21/2018 1015    Cardiac Enzymes: No results for input(s): CKTOTAL, CKMB, CKMBINDEX, TROPONINI in the last 168 hours. CBG: Recent Labs  Lab 12/21/18 1951 12/22/18 1112 12/22/18 1708 12/22/18 2038 12/23/18 0749  GLUCAP 212* 108* 127* 158* 198*   Iron Studies: No results for input(s): IRON, TIBC, TRANSFERRIN, FERRITIN in the last 72 hours. Lab Results  Component Value Date   INR 1.23 12/20/2018   INR 1.10 08/22/2011   Studies/Results: Ct Image Guided Drainage By Percutaneous Catheter  Result Date: 12/21/2018 INDICATION: 53 year old  female with a history of gangrenous acute appendicitis status post appendectomy. Recent CT imaging demonstrates and intrahepatic abscess with a small stone adjacent to the liver. The stone was not present on prior imaging and almost certainly represents a retained appendicolith which likely represents the source of the patient's hepatic abscess. She presents for CT-guided drainage of the abscess cavity. EXAM: CT-guided drain placement MEDICATIONS: The patient is currently admitted to the hospital and receiving intravenous antibiotics. The antibiotics were administered within an appropriate time frame prior to the initiation of the procedure. Additionally, 4 mg Zofran were administered intravenously for nausea. ANESTHESIA/SEDATION: Fentanyl 1 mcg IV; Versed 50 mg IV. Moderate Sedation Time:  21 minutes The patient was continuously monitored during the procedure by the interventional radiology nurse under my direct supervision. COMPLICATIONS: None immediate. PROCEDURE: Informed written consent was obtained from the patient after a thorough discussion of the procedural risks, benefits and alternatives. All questions were addressed. Maximal Sterile Barrier Technique was utilized including caps, mask, sterile gowns, sterile gloves, sterile drape, hand hygiene and skin antiseptic. A timeout was performed prior to the initiation of the procedure. A planning CT scan was performed. The low-attenuation collection in the inferior aspect of hepatic segment 6 is easily visualized. The small appendicoliths is again visualized. A suitable skin entry site was selected and marked. After the skin was sterilely prepped and draped in the standard fashion using chlorhexidine skin prep, local anesthesia was attained by infiltration with 1% lidocaine. A small dermatotomy was made. Under intermittent CT guidance, an 18 gauge trocar needle was advanced through the liver and into the fluid collection. A 0.035 wire was then coiled in the  complex collection. The needle was removed. The skin and transhepatic tract were dilated to 10 [place] and a Cook 10.2 [place] all-purpose drainage catheter was advanced over the wire and formed. Aspiration yields approximately 20 cc of foul-smelling thick, purulent fluid. The aspirated fluid was sent for Gram stain and culture. Follow-up CT imaging demonstrates excellent position of the drainage catheter. The catheter was flushed, connected to JP bulb suction and secured to the skin with 0 Prolene suture. The patient tolerated the procedure well. IMPRESSION: Successful placement of a 10 French transhepatic drainage catheter into the inferior right hepatic abscess. Aspiration yields 20 mL of thick, foul-smelling purulent fluid. Samples were sent for culture. PLAN: 1. Maintain drain to JP bulb suction. 2. Flush drain at least once per shift. 3. Follow cultures and adjust antibiotics accordingly. 4. Surgery aware of the presence of the retained appendicoliths. This will represent a continued nidus for infection/abscess formation until removed. Electronically Signed   By: Jacqulynn Cadet M.D.   On: 12/21/2018 10:15   Medications: . sodium chloride 10 mL/hr at 12/21/18  1035  . cefTRIAXone (ROCEPHIN)  IV 2 g (12/22/18 2100)  . metronidazole 500 mg (12/23/18 0752)   . amLODipine  10 mg Oral Daily  . atorvastatin  40 mg Oral Daily  . calcitRIOL  1 mcg Oral Daily  . Chlorhexidine Gluconate Cloth  6 each Topical Q0600  . darbepoetin (ARANESP) injection - DIALYSIS  100 mcg Intravenous Q Fri-HD  . feeding supplement (NEPRO CARB STEADY)  237 mL Oral BID BM  . insulin aspart  0-9 Units Subcutaneous TID WC  . losartan  50 mg Oral Daily  . metoprolol tartrate  50 mg Oral BID  . multivitamin  1 tablet Oral QHS  . saccharomyces boulardii  250 mg Oral BID  . scopolamine  1 patch Transdermal Q72H  . sodium chloride flush  5 mL Intracatheter Q8H

## 2018-12-23 NOTE — Progress Notes (Signed)
Family Medicine Teaching Service Daily Progress Note Intern Pager: [phone]  Patient name: [name]          Medical record number: [id number] Date of birth: 1966/04/08      Age: 53 y.o.    Gender: female  Primary Care Provider: Rory Percy, DO Consultants: General Surgery, IR, Nephro Code Status: Full  Pt Overview and Major Events to Date:  1/14 Admitted FPTS             CT Abd/pelv: multiloculated cystic lesions in inferior liver suggestive of hepatic abscess, post-op edema/inflammation in cecum and ascending colon 1/15 CT guided drainage of liver abscess   Antibiotic Therapy: Vanc 1/15/-1/16 Cefepime 1/15-1/17 Flagyl 1/15- Ceftriaxone 1/17-   Assessment and Plan: 53 year old female with known ESRD, type 2 diabetes, hypertension, blindness, alcohol dependency presenting with 2 days history of nausea vomiting and diarrhea. Discharged on 1/4 s/p lap appendectomy in which "feculent material poured from appendix", received IV Zosyn x 3 days + JP drain, removed prior to d/c.   Liver Abscess  Sepsis  Enterobacter positive Bacteremia:  S/p CT guided placement of 56F drain into hepatic abscess with plan to leave drain in place for few weeks with follow up with Dr. Kieth Brightly. Diagnostic lap to remove retained appendicolith in further visit.  Leukocytosis stable, 38.1>23.8>21.2>22.5. Patient currently on CTX and Flagyl, undetermined course due to retained appendocolith. Plan to continue IV antibiotics until leukocytosis resolves per surgery recs.  Vitals stable overnight, remained afebrile. Still denies any abdominal pain. - General surgery consulted, appreciate recs             - Determine duration of antibiotics given retained appendocolith after the weekend - IR consulted, appreciate Recs - Continue CTX and IV flagyl x 14 days? (1/15-)             - Consider IV Ceftazidine after HD treatments  - Tylenol PRN for pain and fever  Nausea/Vomiting/Diarrhea: Nausea,  vomiting, and watery diarrhea persisted overnight. GI panel and C.diff negative. S/p 0.5L NS bolus given continued losses. Probiotic given.  Improved with scopalomine patch and phenergan. Repeat EKG unchanged from prior with NSR and mild QTc 467. Denies abdominal pain, nontender to palpation on exam this morning.  Still unable to tolerate p.o.. Does not appear dry on exam, PO fluids encouraged.   - Continue to monitor - Scopalamine patch + Phenergan q6 PRN for nausea as QTc mildly prolonged on EKG to 467 - Continue Florastor probiotic - s/p 517mL NS bolus  - encourage fluids   S/P Laparoscopic appendectomy  Retained Appendicolith: stable POD 17. Healing well. Planning for diagnostic laparoscopy for retrieval of retained appendicolith in future per surgery. - General surgery consulted, appreciate recs, continue to follow  ESRD on HD Nephro consulted. HD on 1/18 with 1L off. Home meds: dialyvite and [place].  -Nephro following, appreciate Recs - cont home dialyvite - cont home auryxia - monitor I/O's  T2DM  Peripheral Neuropathy  On humulin 70/30 BID and Gabapentin for neuropathy. Last A1c 10/17/18 7.7. CBG range ON: 120-200. - CBG AC/HS - sSSI  HTN Takes amlodipine, losartain, and lopressor 50mg  BID at home. Normotensive ON.  - cont home BP meds    HLD Takes lipitor at home. LFT's stable.  - cont home lipitor, would hold if LFTs increase  Blindness Can only see shapes OD, - up with assistance  GERD  Takes protonix 20mg  QD at home. - cont home protonix  Hypokalemia: Resolved - continue to monitor  Tachycardia: resolved  FEN/GI: Thin renal diet with Nepro shake po BID, Protonix, Phenergen/Scopalomine PPx: SCDs  Disposition: Pending IR and gen surg recs, continue IV abx  Subjective:  Patient notes continued nausea, vomiting and watery diarrhea overnight. Denies feeling worse, but feels about the same overall. Starting to feel a little better after getting  a dose of Phenergan and she notes the scopolamine patch has helped some too. Is still unable to tolerate PO.    Objective: Temp:  [98.3 F (36.8 C)-99.9 F (37.7 C)] 99 F (37.2 C) (01/18 0408) Pulse Rate:  [87-93] 89 (01/18 0408) Resp:  [17-18] 18 (01/18 0408) BP: (119-147)/(58-76) 142/67 (01/18 0408) SpO2:  [93 %-99 %] 96 % (01/18 0408) Weight:  [88.3 kg] 88.3 kg (01/17 1136) Physical Exam: General: well nourished, well developed, in no acute distress with non-toxic appearance, lying in bed, appears uncomfortable this morning HEENT: normocephalic, atraumatic, moist mucous membranes this morning Neck: supple, normal ROM CV: regular rate and rhythm, 2/6 systolic murmur heard best at left upper sternal border, no lower extremity edema, 2+ radial and pedal pulses bilaterally Lungs: clear to auscultation bilaterally with normal work of breathing Abdomen: soft, non-tender to palpation, non-distended, normoactive bowel sounds, healing surgical wound superior to umbilicus with mild epidermal separation, no surrounding erythema, bandage for drain is clean and intact Skin: warm, dry, no rashes or lesions Extremities: warm and well perfused, Right BKA , 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 Neuro: Alert and oriented, speech normal  Laboratory: Recent Labs  Lab 12/21/18 0714 12/22/18 0350 12/23/18 0533  WBC 23.8* 21.2* 22.5*  HGB 8.1* 8.2* 8.7*  HCT 26.8* 27.2* 28.1*  PLT 322 360 183   Recent Labs  Lab 12/19/18 1932 12/20/18 0630 12/21/18 0714 12/22/18 0350  NA 139 136 139 138  K 3.8 3.7 3.4* 3.5  CL 90* 92* 99 99  CO2 24 27 22  21*  BUN 38* 44* 34* 46*  CREATININE 8.23* 9.19* 7.76* 9.44*  CALCIUM 8.9 7.9* 8.0* 8.0*  PROT 8.2* 6.6 6.2*  --   BILITOT 1.3* 0.8 1.4*  --   ALKPHOS 115 95 87  --   ALT 25 37 39  --   AST 47* 78* 77*  --   GLUCOSE 199* 250* 153* 169*   Lactic acid: 1.71.82 Blood culture: pending Blood cx ID: Enterobacter cloacae complex HcG: neg FOBT: neg Trop:  0.01 Flu: neg Lipase: 60  Imaging/Diagnostic Tests: CXR 1/14:  IMPRESSION: Mild cardiac enlargement. Linear atelectasis in the lung bases. No evidence of active pulmonary disease Ct Abdomen Pelvis W Contrast 1/16: IMPRESSION: 1. Multiloculated cystic lesions demonstrated in the inferior liver, new since previous study, suggesting hepatic abscess. 2. Postoperative and/or inflammatory stranding and edema throughout the right lower quadrant fat with associated colonic wall thickening in the cecum and ascending colon. 3. Peritoneal nodule along the right lower quadrant anteriorly may represent postoperative changes or postoperative inflammatory reaction. ADDENDUM REPORT: 12/21/2018 10:40 ADDENDUM: There is an approximately 8-9 mm radiopaque stone adjacent to the inferior tip of the liver with a surrounding fluid collection. This is contiguous with intrahepatic abscess in hepatic segment 6. This stone was not present on the prior examination when the patient was diagnosed with acute appendicitis with numerous appendicoliths. This almost certainly represents a retained appendicolith which is the source of the patient's intrahepatic abscess. Ct Image Guided Drainage By Percutaneous Catheter (1/16):  IMPRESSION: Successful placement of a 10 French transhepatic drainage catheter into the inferior right hepatic abscess. Aspiration yields 20  mL of thick, foul-smelling purulent fluid. Samples were sent for culture. PLAN: 1. Maintain drain to JP bulb suction. 2. Flush drain at least once per shift. 3. Follow cultures and adjust antibiotics accordingly. 4. Surgery aware of the presence of the retained appendicoliths. This will represent a continued nidus for infection/abscess formation until removed.  Mina Marble [place], DO 12/23/2018, 8:28 AM PGY-1, [place] Intern pager: [phone], text pages welcome

## 2018-12-23 NOTE — Progress Notes (Addendum)
Referring Physician(s): Dr. Mingo Amber  Supervising Physician: Markus Daft  Patient Status:  [facility] - In-pt  Chief Complaint: Follow up liver abscess drain placed 12/21/18 by Dr. Laurence Ferrari  Subjective:  Patient reports she feels ok, not having any pain. She does complain of heartburn and is requesting something besides tums. She denies nausea, vomiting, diarrhea or abdominal pain. She has been able to eat a little. She is unsure how much has been coming out of her drain.   Allergies: Patient has no known allergies.  Medications: Prior to Admission medications   Medication Sig Start Date End Date Taking? Authorizing Provider  amLODipine (NORVASC) 10 MG tablet TAKE 1 TABLET BY MOUTH ONCE DAILY Patient taking differently: 10 mg daily.  05/11/18  Yes Rory Percy, DO  atorvastatin (LIPITOR) 40 MG tablet TAKE 1 TABLET BY MOUTH ONCE DAILY Patient taking differently: Take 40 mg by mouth daily at 6 PM.  05/11/18  Yes Rumball, Bryson Ha, DO  AURYXIA 1 GM 210 MG(Fe) tablet Take 210-420 mg by mouth See admin instructions. Take 420 mg with meals and 210 mg with snacks 10/06/18  Yes [provider]  B Complex-C-Folic Acid (DIALYVITE 528) 0.8 MG TABS Take 1 tablet by mouth daily. 10/09/18  Yes [provider]  gabapentin (NEURONTIN) 100 MG capsule TAKE 1 CAPSULE BY MOUTH ONCE DAILY AS NEEDED Patient taking differently: Take 100 mg by mouth daily.  11/01/18  Yes Rumball, Alison, DO  HUMULIN 70/30 KWIKPEN (70-30) 100 UNIT/ML PEN INJECT 10 UNITS SUBCUTANEOUSLY AT BREAKFAST AND AT SUPPER Patient taking differently: Inject 5-10 Units into the skin 2 (two) times daily.  11/01/18  Yes Rory Percy, DO  losartan (COZAAR) 50 MG tablet Take 1 tablet (50 mg total) by mouth daily. 08/10/18  Yes Rory Percy, DO  metoprolol tartrate (LOPRESSOR) 50 MG tablet TAKE 1 TABLET BY MOUTH TWICE DAILY Patient taking differently: Take 50 mg by mouth 2 (two) times daily.  08/10/18  Yes Rory Percy, DO    mupirocin cream (BACTROBAN) 2 % Apply 1 application topically 2 (two) times daily. 10/26/17  Yes Mayo, Pete Pelt, MD  Blood Glucose Monitoring Suppl (PRODIGY AUTOCODE BLOOD GLUCOSE) w/Device KIT 1 Device by Does not apply route 3 (three) times daily. 10/26/18   Rory Percy, DO  glucose blood test strip Use as instructed 10/26/18   Rory Percy, DO  Insulin Pen Needle (EASY TOUCH PEN NEEDLES) 32G X 4 MM MISC 1 each by Does not apply route 3 (three) times daily. 08/10/18   Rory Percy, DO  Lancet Devices (ONE TOUCH DELICA LANCING DEV) MISC Check blood sugars once daily 05/25/18   Mayo, Pete Pelt, MD  PRODIGY LANCETS 28G MISC 1 each by Does not apply route 3 (three) times daily. 10/26/18   Rory Percy, DO     Vital Signs: BP (!) 142/66 (BP Location: Right Arm)   Pulse 86   Temp 99.1 F (37.3 C) (Oral)   Resp 16   Ht _0  (1.702 m)   Wt 194 lb 10.7 oz (88.3 kg)   SpO2 95%   BMI 30.49 kg/m   Physical Exam Vitals signs and nursing note reviewed.  Constitutional:      General: She is not in acute distress. HENT:     Head: Normocephalic.  Cardiovascular:     Rate and Rhythm: Normal rate.  Pulmonary:     Effort: Pulmonary effort is normal.  Abdominal:     Palpations: Abdomen is soft.     Tenderness: There  is no abdominal tenderness.     Comments: RUQ drain to JP; approximately 50 cc dark liquid OP. Insertion site clean, dry, dressed appropriately without erythema, edema, drainage or active bleeding.   Skin:    General: Skin is warm and dry.  Neurological:     Mental Status: She is alert. Mental status is at baseline.     Imaging: Dg Chest 2 View  Result Date: 12/19/2018 CLINICAL DATA:  Nausea, vomiting, and diarrhea. Generalized body aches over the last day. History of hypertension and dialysis. EXAM: CHEST - 2 VIEW COMPARISON:  11/16/2017 FINDINGS: Mild cardiac enlargement. Linear atelectasis in the lung bases. No airspace disease or consolidation in the lungs. No  blunting of costophrenic angles. No pneumothorax. Mediastinal contours appear intact. Degenerative changes in the shoulders. IMPRESSION: Mild cardiac enlargement. Linear atelectasis in the lung bases. No evidence of active pulmonary disease. Electronically Signed   By: Lucienne Capers M.D.   On: 12/19/2018 21:42   Ct Abdomen Pelvis W Contrast  Addendum Date: 12/21/2018   ADDENDUM REPORT: 12/21/2018 10:40 ADDENDUM: There is an approximately 8-9 mm radiopaque stone adjacent to the inferior tip of the liver with a surrounding fluid collection. This is contiguous with intrahepatic abscess in hepatic segment 6. This stone was not present on the prior examination when the patient was diagnosed with acute appendicitis with numerous appendicoliths. This almost certainly represents a retained appendicolith which is the source of the patient's intrahepatic abscess. These results were called by telephone at the time of interpretation on 12/21/2018 to [facility], [place] , who verbally acknowledged these results. Electronically Signed   By: Jacqulynn Cadet M.D.   On: 12/21/2018 10:40   Result Date: 12/21/2018 CLINICAL DATA:  Nausea, vomiting, and diarrhea. Generalized body aches for 1 day. Dialysis for 3 years. Appendectomy on 12/06/2018. EXAM: CT ABDOMEN AND PELVIS WITH CONTRAST TECHNIQUE: Multidetector CT imaging of the abdomen and pelvis was performed using the standard protocol following bolus administration of intravenous contrast. CONTRAST:  177m OMNIPAQUE IOHEXOL 300 MG/ML  SOLN COMPARISON:  12/06/2018 FINDINGS: Lower chest: Linear atelectasis in the lung bases. Hepatobiliary: Lobulated circumscribed cystic lesions demonstrated in the inferior liver, new since previous study, suggesting hepatic abscess. Largest lesion measures about 3.7 cm diameter. Gallbladder and bile ducts are unremarkable. Pancreas: Unremarkable. No pancreatic ductal dilatation or surrounding inflammatory changes. Spleen: Normal in size  without focal abnormality. Adrenals/Urinary Tract: Adrenal glands are unremarkable. Kidneys are normal, without renal calculi, focal lesion, or hydronephrosis. Bladder is unremarkable. Stomach/Bowel: Stomach, small bowel, and colon are mostly decompressed, limiting evaluation of the wall. In the right lower quadrant, there is evidence of infiltration in the right lower quadrant fat with some wall thickening suggested in the cecum and ascending colon. Minimal soft tissue edema without definite loculation. This may represent postoperative change or residual inflammatory changes from previous appendicitis. Recurrent inflammatory process could also be present. Vascular/Lymphatic: No significant vascular findings are present. No enlarged abdominal or pelvic lymph nodes. There a few scattered prominent lymph nodes in the right lower quadrant, likely reactive. Reproductive: Uterus and bilateral adnexa are unremarkable. Other: No free air in the abdomen. Peritoneal nodule along the right lower quadrant anteriorly measuring 1.4 cm diameter, not present previously. This could represent postoperative changes a suture granuloma or postoperative inflammatory reaction. Musculoskeletal: No acute or significant osseous findings. IMPRESSION: 1. Multiloculated cystic lesions demonstrated in the inferior liver, new since previous study, suggesting hepatic abscess. 2. Postoperative and/or inflammatory stranding and edema throughout the right lower quadrant  fat with associated colonic wall thickening in the cecum and ascending colon. 3. Peritoneal nodule along the right lower quadrant anteriorly may represent postoperative changes or postoperative inflammatory reaction. Electronically Signed: By: Lucienne Capers M.D. On: 12/19/2018 22:44   Ct Image Guided Drainage By Percutaneous Catheter  Result Date: 12/21/2018 INDICATION: 53 year old female with a history of gangrenous acute appendicitis status post appendectomy. Recent CT  imaging demonstrates and intrahepatic abscess with a small stone adjacent to the liver. The stone was not present on prior imaging and almost certainly represents a retained appendicolith which likely represents the source of the patient's hepatic abscess. She presents for CT-guided drainage of the abscess cavity. EXAM: CT-guided drain placement MEDICATIONS: The patient is currently admitted to the hospital and receiving intravenous antibiotics. The antibiotics were administered within an appropriate time frame prior to the initiation of the procedure. Additionally, 4 mg Zofran were administered intravenously for nausea. ANESTHESIA/SEDATION: Fentanyl 1 mcg IV; Versed 50 mg IV. Moderate Sedation Time:  21 minutes The patient was continuously monitored during the procedure by the interventional radiology nurse under my direct supervision. COMPLICATIONS: None immediate. PROCEDURE: Informed written consent was obtained from the patient after a thorough discussion of the procedural risks, benefits and alternatives. All questions were addressed. Maximal Sterile Barrier Technique was utilized including caps, mask, sterile gowns, sterile gloves, sterile drape, hand hygiene and skin antiseptic. A timeout was performed prior to the initiation of the procedure. A planning CT scan was performed. The low-attenuation collection in the inferior aspect of hepatic segment 6 is easily visualized. The small appendicoliths is again visualized. A suitable skin entry site was selected and marked. After the skin was sterilely prepped and draped in the standard fashion using chlorhexidine skin prep, local anesthesia was attained by infiltration with 1% lidocaine. A small dermatotomy was made. Under intermittent CT guidance, an 18 gauge trocar needle was advanced through the liver and into the fluid collection. A 0.035 wire was then coiled in the complex collection. The needle was removed. The skin and transhepatic tract were dilated to 10  [place] and a Cook 10.2 [place] all-purpose drainage catheter was advanced over the wire and formed. Aspiration yields approximately 20 cc of foul-smelling thick, purulent fluid. The aspirated fluid was sent for Gram stain and culture. Follow-up CT imaging demonstrates excellent position of the drainage catheter. The catheter was flushed, connected to JP bulb suction and secured to the skin with 0 Prolene suture. The patient tolerated the procedure well. IMPRESSION: Successful placement of a 10 French transhepatic drainage catheter into the inferior right hepatic abscess. Aspiration yields 20 mL of thick, foul-smelling purulent fluid. Samples were sent for culture. PLAN: 1. Maintain drain to JP bulb suction. 2. Flush drain at least once per shift. 3. Follow cultures and adjust antibiotics accordingly. 4. Surgery aware of the presence of the retained appendicoliths. This will represent a continued nidus for infection/abscess formation until removed. Electronically Signed   By: Jacqulynn Cadet M.D.   On: 12/21/2018 10:15    Labs:  CBC: Recent Labs    12/20/18 0630 12/21/18 0714 12/22/18 0350 12/23/18 0533  WBC 38.1* 23.8* 21.2* 22.5*  HGB 8.6* 8.1* 8.2* 8.7*  HCT 28.8* 26.8* 27.2* 28.1*  PLT 391 322 360 183    COAGS: Recent Labs    12/20/18 0019  INR 1.23    BMP: Recent Labs    12/19/18 1932 12/20/18 0630 12/21/18 0714 12/22/18 0350  NA 139 136 139 138  K 3.8 3.7 3.4* 3.5  CL 90* 92* 99 99  CO2 _0 21*  GLUCOSE 199* 250* 153* 169*  BUN 38* 44* 34* 46*  CALCIUM 8.9 7.9* 8.0* 8.0*  CREATININE 8.23* 9.19* 7.76* 9.44*  GFRNONAA 5* 4* 5* 4*  GFRAA 6* 5* 6* 5*    LIVER FUNCTION TESTS: Recent Labs    12/06/18 1808 12/08/18 0723 12/19/18 1932 12/20/18 0630 12/21/18 0714  BILITOT 0.4  --  1.3* 0.8 1.4*  AST 15  --  47* 78* 77*  ALT 12  --  25 37 39  ALKPHOS 70  --  115 95 87  PROT 7.4  --  8.2* 6.6 6.2*  ALBUMIN 3.5 2.5* 2.8* 2.2* 2.0*    Assessment and  Plan:  Patient s/p liver abscess drain placement 12/21/18 by Dr. Laurence Ferrari - patient reports she feels better overall aside from some heartburn, tolerating PO intake. 30 cc OP recorded in last 24 hours - approximately 50 cc in JP upon exam today. Culture of aspirate pending. Tmax 99.9, WBC remains about the same at 22.5, hgb 8.7, plt 183, creatinine 9.44. She continues on rocephin + flagyl per primary team. She has follow up with surgery planned 01/03/19  Per chart possible d/c this weekend. Continue TID flushes with 3-5 cc NS, record drain output daily while inpatient. Orders placed for outpatient follow up in IR clinic in approximately 1 week, orange drain card with care instructions and prescription for NS flushes placed in chart today to be provided to patient with discharge paperwork.   Upon d/c patient will need to flush drain QD with 5 cc NS, record output once daily on orange drain card.   Discharge plans per primary service - please call IR with questions or concerns.   Electronically Signed: Joaquim Nam, PA-C 12/23/2018, 1:13 PM   I spent a total of 25 Minutes at the the patient's bedside AND on the patient's hospital floor or unit, greater than 50% of which was counseling/coordinating care for liver abscess drain follow up.

## 2018-12-24 DIAGNOSIS — Z9049 Acquired absence of other specified parts of digestive tract: Secondary | ICD-10-CM

## 2018-12-24 DIAGNOSIS — K381 Appendicular concretions: Secondary | ICD-10-CM

## 2018-12-24 DIAGNOSIS — B961 Klebsiella pneumoniae [K. pneumoniae] as the cause of diseases classified elsewhere: Secondary | ICD-10-CM

## 2018-12-24 DIAGNOSIS — F1021 Alcohol dependence, in remission: Secondary | ICD-10-CM

## 2018-12-24 DIAGNOSIS — B9689 Other specified bacterial agents as the cause of diseases classified elsewhere: Secondary | ICD-10-CM

## 2018-12-24 DIAGNOSIS — H548 Legal blindness, as defined in USA: Secondary | ICD-10-CM

## 2018-12-24 DIAGNOSIS — R7881 Bacteremia: Secondary | ICD-10-CM

## 2018-12-24 LAB — CBC
HCT: 31 % — ABNORMAL LOW (ref 36.0–46.0)
Hemoglobin: 9.7 g/dL — ABNORMAL LOW (ref 12.0–15.0)
MCH: 23 pg — ABNORMAL LOW (ref 26.0–34.0)
MCHC: 31.3 g/dL (ref 30.0–36.0)
MCV: 73.6 fL — ABNORMAL LOW (ref 80.0–100.0)
Platelets: 335 10*3/uL (ref 150–400)
RBC: 4.21 MIL/uL (ref 3.87–5.11)
RDW: 18.4 % — ABNORMAL HIGH (ref 11.5–15.5)
WBC: 22.7 10*3/uL — ABNORMAL HIGH (ref 4.0–10.5)
nRBC: 0.1 % (ref 0.0–0.2)

## 2018-12-24 LAB — AEROBIC/ANAEROBIC CULTURE W GRAM STAIN (SURGICAL/DEEP WOUND): Special Requests: NORMAL

## 2018-12-24 LAB — BASIC METABOLIC PANEL
Anion gap: 19 — ABNORMAL HIGH (ref 5–15)
BUN: 45 mg/dL — ABNORMAL HIGH (ref 6–20)
CO2: 21 mmol/L — ABNORMAL LOW (ref 22–32)
Calcium: 8.5 mg/dL — ABNORMAL LOW (ref 8.9–10.3)
Chloride: 96 mmol/L — ABNORMAL LOW (ref 98–111)
Creatinine, Ser: 7.84 mg/dL — ABNORMAL HIGH (ref 0.44–1.00)
GFR calc Af Amer: 6 mL/min — ABNORMAL LOW (ref >60)
GFR calc non Af Amer: 5 mL/min — ABNORMAL LOW (ref >60)
Glucose, Bld: 236 mg/dL — ABNORMAL HIGH (ref 70–99)
Potassium: 3.9 mmol/L (ref 3.5–5.1)
Sodium: 136 mmol/L (ref 135–145)

## 2018-12-24 LAB — GLUCOSE, CAPILLARY
Glucose-Capillary: 205 mg/dL — ABNORMAL HIGH (ref 70–99)
Glucose-Capillary: 274 mg/dL — ABNORMAL HIGH (ref 70–99)
Glucose-Capillary: 283 mg/dL — ABNORMAL HIGH (ref 70–99)
Glucose-Capillary: 241 mg/dL — ABNORMAL HIGH (ref 70–99)

## 2018-12-24 MED ORDER — CEFTAZIDIME 2 G IJ SOLR: 2.0000 g | INTRAMUSCULAR | Status: DC

## 2018-12-24 MED ORDER — SODIUM CHLORIDE 0.9 % IV SOLN
2.0000 g | INTRAVENOUS | Status: DC
  Administered 2018-12-25: 2 g via INTRAVENOUS
  Filled 2018-12-24: qty 2

## 2018-12-24 MED ORDER — PANTOPRAZOLE SODIUM 20 MG PO TBEC
20.0000 mg | DELAYED_RELEASE_TABLET | Freq: Every day | ORAL | Status: DC
  Administered 2018-12-24: 20 mg via ORAL
  Filled 2018-12-24: qty 1

## 2018-12-24 MED ORDER — PANTOPRAZOLE SODIUM 40 MG PO TBEC
40.0000 mg | DELAYED_RELEASE_TABLET | Freq: Every day | ORAL | Status: DC
  Administered 2018-12-25 – 2018-12-26 (×2): 40 mg via ORAL
  Filled 2018-12-24 (×2): qty 1

## 2018-12-24 MED ORDER — METRONIDAZOLE 500 MG PO TABS
500.0000 mg | ORAL_TABLET | Freq: Three times a day (TID) | ORAL | Status: DC
  Administered 2018-12-24 – 2018-12-26 (×6): 500 mg via ORAL
  Filled 2018-12-24 (×6): qty 1

## 2018-12-24 NOTE — Consult Note (Signed)
[facility] Nurse wound consult note Reason for Consult:Teach patient how to care for drain Bedside RN contacted and she is aware that teaching the patient how to care for the drain at home is within her scope of practice.  No need for [facility] Nurse consult.  [facility] nursing team will not follow, but will remain available to this patient, the nursing and medical teams.  Please re-consult if needed. Thanks, Maudie Flakes, MSN, RN, [place], Arther Abbott  Pager# [phone]

## 2018-12-24 NOTE — Progress Notes (Signed)
Pharmacy Antibiotic Note  [name] is a 53 y.o. female admitted on 12/19/2018 with bacteremia.  Pharmacy has been consulted for ceftazidime dosing.  Plan: Start Ceftazidime 2g with every HD session.  Monitor duration of therapy and patient clinical status  Height: 5\' 7"  (170.2 cm) Weight: 194 lb 10.7 oz (88.3 kg) IBW/kg (Calculated) : 61.6  Temp (24hrs), Avg:98.7 F (37.1 C), Min:98.4 F (36.9 C), Max:98.9 F (37.2 C)  Recent Labs  Lab 12/19/18 1932 12/19/18 2218 12/20/18 0030 12/20/18 0630 12/21/18 0714 12/22/18 0350 12/23/18 0533 12/24/18 0527  WBC 23.8*  --   --  38.1* 23.8* 21.2* 22.5* 22.7*  CREATININE 8.23*  --   --  9.19* 7.76* 9.44*  --  7.84*  LATICACIDVEN  --  1.70 1.82  --   --   --   --   --     Estimated Creatinine Clearance: 9.7 mL/min (A) (by C-G formula based on SCr of 7.84 mg/dL (H)).    No Known Allergies  Antimicrobials this admission: Ceftazidime 1/19>>  Flagyl 1/15 >>  Cefepime 1/15>1/16 Ceftriaxone 1/17>1/19 Vancomycin 1/15>1/15  Microbiology results: 1/15 BCx: Klebsiella oxytoca  1/16 Wound cx: enterobacter cloacae (sensitive to ceftazidime)   Thank you for allowing pharmacy to be a part of this patient's care.  Gwenlyn Found, Sherian Rein D PGY1 Pharmacy Resident  Phone [phone] 12/24/2018   11:09 AM

## 2018-12-24 NOTE — Progress Notes (Addendum)
Family Medicine Teaching Service Daily Progress Note Intern Pager: [phone]  Patient name: [name] Medical record number: [id number] Date of birth: 07-01-1966 Age: 53 y.o. Gender: female  Primary Care Provider: Rory Percy, DO Consultants: Gen Surg, IR, Nephro  Code Status: Full   Pt Overview and Major Events to Date:  1/14 Admitted [facility] 1/16CT guided drainage of liver abscess  Assessment and Plan: 53 year old female with known ESRD, type 2 diabetes, hypertension, blindness, alcohol dependency presenting with 2 days history of nausea vomiting and diarrhea. Discharged on 1/4 s/p lap appendectomy in which "feculent material poured from appendix", received IV Zosyn x 3 days + JP drain, removed prior to d/c.   Liver Abscess  Sepsis  Enterobacter positive Bacteremia: S/p CT guidedplacement of drain into hepatic abscess. Per IR doing well with surgery planned 1/29. IR recs to perform TID flushes with 3-5cc NS, outpatient f/u with IR clinic x1 week. Upon discharge will need to flush drain  QD with 5cc NS. Surgery recommendatons for ID consult for duration of abx. Leukocytosis stable, 22.5>22.7. Plan to continue IV antibiotics until leukocytosis resolves per surgery recs. Vitals stable overnight, remained afebrile.   - General surgery consulted, appreciate recs: recommend ID consult for duration of IV abx  - ID consulted for antibiotic course duration, appreciate recommendations  - IR consulted, appreciate Recs: DC per primary team, discharge with drain (instructions given to patient)  -Continue CTX and IV flagyl (1/15-) - Tylenol PRN for pain and fever - will consult wound care for further instructions on drain management at home - care management consulted for supplies for drain management   Nausea/Vomiting/Diarrhea: Denies further Nausea, vomiting, or watery diarrhea. GI panel and C.diff negative. Probiotic given. Improved with scopalomine patch and phenergan. Repeat EKG  unchanged from prior with NSR and mild QTc 467. Denies abdominal pain, nontender to palpation on exam this. Does report some increased heartburn. Now tolerating PO, ate peaches yesterday evening and bacon this morning. - Continue to monitor -Scopalamine patch +Phenerganq6 PRNfor nausea as QTc mildly prolonged on EKG to 467 - Continue Florastor probiotic - encourage fluids  - start protonix 20mg   S/P Laparoscopic appendectomy  Retained Appendicolith: stable POD 18. Healing well. Planning for diagnostic laparoscopy for retrieval of retained appendicolith in future per surgery. - General surgery consulted, appreciate recs, continue to follow  ESRD on HD Nephro consulted. Next HD planned for 1/20.Home meds: dialyvite and [place].  - Nephro following, appreciate Recs - cont home dialyvite - cont home auryxia - monitor I/O's  T2DM  Peripheral Neuropathy  On humulin 70/30 BID and Gabapentin for neuropathy. Last A1c 10/17/18 7.7. CBG range ON: 205-256. Received 3U Insulin Aspart  - CBG AC/HS - sSSI  HTN Takes amlodipine, losartain, and lopressor 50mg  BID at home. Normotensive ON.  - cont home BP meds   HLD Takes lipitor at home. LFT's stable.  - cont home lipitor, would hold if LFTs increase  Blindness Can only see shapes OD, - up with assistance  GERD  Takes protonix 20mg  QD at home. - cont home protonix  Hypokalemia:Resolved K of 3.9.  - continue to monitor  Tachycardia:resolved  FEN/GI:Thin renal diet with Nepro shake po BID, Protonix, Phenergen/Scopalomine PPx: SCDs  Disposition: home when cleared by surgery and antibiotic regimen is finalized  Subjective:  Patient states she is doing better today. States she has been able to tolerate PO, eating peaches yesterday evening and bacon this morning. Does report heartburn this morning. States she has had for a  couple of days now. Has tried tums with no improvement.   Objective: Temp:  [98.4 F (36.9  C)-98.9 F (37.2 C)] 98.9 F (37.2 C) (01/19 0848) Pulse Rate:  [87-91] 87 (01/19 0848) Resp:  [18] 18 (01/19 0848) BP: (147-149)/(65-73) 147/73 (01/19 0848) SpO2:  [97 %-99 %] 99 % (01/19 0848) Physical Exam: General: awake and alert, sitting up in bed eating breakfast  Cardiovascular: RRR, no MRG  Respiratory: CTAB, no wheezes, rales, or rhonchi  Abdomen: soft, non tender, non distended, bowel sounds normal  Extremities: no edema, non tender   Laboratory: Recent Labs  Lab 12/22/18 0350 12/23/18 0533 12/24/18 0527  WBC 21.2* 22.5* 22.7*  HGB 8.2* 8.7* 9.7*  HCT 27.2* 28.1* 31.0*  PLT 360 183 335   Recent Labs  Lab 12/19/18 1932 12/20/18 0630 12/21/18 0714 12/22/18 0350 12/24/18 0527  NA 139 136 139 138 136  K 3.8 3.7 3.4* 3.5 3.9  CL 90* 92* 99 99 96*  CO2 24 27 22  21* 21*  BUN 38* 44* 34* 46* 45*  CREATININE 8.23* 9.19* 7.76* 9.44* 7.84*  CALCIUM 8.9 7.9* 8.0* 8.0* 8.5*  PROT 8.2* 6.6 6.2*  --   --   BILITOT 1.3* 0.8 1.4*  --   --   ALKPHOS 115 95 87  --   --   ALT 25 37 39  --   --   AST 47* 78* 77*  --   --   GLUCOSE 199* 250* 153* 169* 236*    CBG (last 3)  Recent Labs    12/23/18 1654 12/23/18 2105 12/24/18 0719  GLUCAP 256* 228* 205*     Imaging/Diagnostic Tests: Ct Abdomen Pelvis Wo Contrast  Result Date: 12/06/2018 CLINICAL DATA:  Acute onset of RIGHT LOWER QUADRANT abdominal pain that began yesterday during hemodialysis. Solitary episode of vomiting. EXAM: CT ABDOMEN AND PELVIS WITHOUT CONTRAST TECHNIQUE: Multidetector CT imaging of the abdomen and pelvis was performed following the standard protocol without IV contrast. COMPARISON:  None. FINDINGS: Lower chest: Atelectasis involving the lower lobes. Visualized lung bases otherwise clear. Heart moderately enlarged. Hepatobiliary: Normal unenhanced appearance of the liver. Prominent Riedel's lobe. Gallbladder normal in appearance without calcified gallstones. No biliary ductal dilation. Pancreas:  Normal unenhanced appearance. Spleen: Normal unenhanced appearance. Adrenals/Urinary Tract: Normal appearing adrenal glands. No evidence of urinary tract calculi. Within the limits of the unenhanced technique, no focal parenchymal abnormality involving either kidney. No evidence of hydronephrosis involving either kidney. Normal appearing urinary bladder. Stomach/Bowel: Very small hiatal hernia. Stomach decompressed and otherwise unremarkable. Normal-appearing small bowel. Mobile cecum positioned in the RIGHT UPPER QUADRANT of the abdomen. Moderate stool burden throughout the normal appearing colon. Appendix: Location: RIGHT UPPER pelvis, LATERAL retrocecal location. Diameter: 16 mm Appendicolith: Multiple. Mucosal hyper-enhancement: Moderate. Extraluminal gas: Absent. Periappendiceal collection: Absent. Marked edema/inflammation surrounding the appendix. Vascular/Lymphatic: Mild to moderate iliofemoral atherosclerosis. No evidence of aortic aneurysm. No pathologic lymphadenopathy. Reproductive: Normal-appearing uterus and ovaries without evidence of adnexal mass. Other: None. Musculoskeletal: BILATERAL L4 pars defects without evidence of spondylolisthesis. Mild degenerative disc disease at L4-5 with circumferential disc bulge. Mild multifactorial spinal stenosis at the L4-5 level. No acute findings. IMPRESSION: 1. Acute appendicitis. No evidence of perforation or abscess. 2. Atelectasis involving the lower lobes. 3. Very small hiatal hernia. 4. BILATERAL L4 pars defects without evidence of spondylolisthesis. Mild multifactorial spinal stenosis at L4-5. I telephoned these critical/emergent results to [facility], [place], in the emergency department at the time of interpretation on 12/06/2018 at 7:12 p.m.  Electronically Signed   By: Evangeline Dakin M.D.   On: 12/06/2018 19:13   Dg Chest 2 View  Result Date: 12/19/2018 CLINICAL DATA:  Nausea, vomiting, and diarrhea. Generalized body aches over the last day.  History of hypertension and dialysis. EXAM: CHEST - 2 VIEW COMPARISON:  11/16/2017 FINDINGS: Mild cardiac enlargement. Linear atelectasis in the lung bases. No airspace disease or consolidation in the lungs. No blunting of costophrenic angles. No pneumothorax. Mediastinal contours appear intact. Degenerative changes in the shoulders. IMPRESSION: Mild cardiac enlargement. Linear atelectasis in the lung bases. No evidence of active pulmonary disease. Electronically Signed   By: Lucienne Capers M.D.   On: 12/19/2018 21:42   Ct Abdomen Pelvis W Contrast  Addendum Date: 12/21/2018   ADDENDUM REPORT: 12/21/2018 10:40 ADDENDUM: There is an approximately 8-9 mm radiopaque stone adjacent to the inferior tip of the liver with a surrounding fluid collection. This is contiguous with intrahepatic abscess in hepatic segment 6. This stone was not present on the prior examination when the patient was diagnosed with acute appendicitis with numerous appendicoliths. This almost certainly represents a retained appendicolith which is the source of the patient's intrahepatic abscess. These results were called by telephone at the time of interpretation on 12/21/2018 to [facility], [place] , who verbally acknowledged these results. Electronically Signed   By: Jacqulynn Cadet M.D.   On: 12/21/2018 10:40   Result Date: 12/21/2018 CLINICAL DATA:  Nausea, vomiting, and diarrhea. Generalized body aches for 1 day. Dialysis for 3 years. Appendectomy on 12/06/2018. EXAM: CT ABDOMEN AND PELVIS WITH CONTRAST TECHNIQUE: Multidetector CT imaging of the abdomen and pelvis was performed using the standard protocol following bolus administration of intravenous contrast. CONTRAST:  169mL OMNIPAQUE IOHEXOL 300 MG/ML  SOLN COMPARISON:  12/06/2018 FINDINGS: Lower chest: Linear atelectasis in the lung bases. Hepatobiliary: Lobulated circumscribed cystic lesions demonstrated in the inferior liver, new since previous study, suggesting hepatic abscess.  Largest lesion measures about 3.7 cm diameter. Gallbladder and bile ducts are unremarkable. Pancreas: Unremarkable. No pancreatic ductal dilatation or surrounding inflammatory changes. Spleen: Normal in size without focal abnormality. Adrenals/Urinary Tract: Adrenal glands are unremarkable. Kidneys are normal, without renal calculi, focal lesion, or hydronephrosis. Bladder is unremarkable. Stomach/Bowel: Stomach, small bowel, and colon are mostly decompressed, limiting evaluation of the wall. In the right lower quadrant, there is evidence of infiltration in the right lower quadrant fat with some wall thickening suggested in the cecum and ascending colon. Minimal soft tissue edema without definite loculation. This may represent postoperative change or residual inflammatory changes from previous appendicitis. Recurrent inflammatory process could also be present. Vascular/Lymphatic: No significant vascular findings are present. No enlarged abdominal or pelvic lymph nodes. There a few scattered prominent lymph nodes in the right lower quadrant, likely reactive. Reproductive: Uterus and bilateral adnexa are unremarkable. Other: No free air in the abdomen. Peritoneal nodule along the right lower quadrant anteriorly measuring 1.4 cm diameter, not present previously. This could represent postoperative changes a suture granuloma or postoperative inflammatory reaction. Musculoskeletal: No acute or significant osseous findings. IMPRESSION: 1. Multiloculated cystic lesions demonstrated in the inferior liver, new since previous study, suggesting hepatic abscess. 2. Postoperative and/or inflammatory stranding and edema throughout the right lower quadrant fat with associated colonic wall thickening in the cecum and ascending colon. 3. Peritoneal nodule along the right lower quadrant anteriorly may represent postoperative changes or postoperative inflammatory reaction. Electronically Signed: By: Lucienne Capers M.D. On: 12/19/2018  22:44   Ct Image Guided Drainage By Percutaneous Catheter  Result Date: 12/21/2018 INDICATION: 53 year old female with a history of gangrenous acute appendicitis status post appendectomy. Recent CT imaging demonstrates and intrahepatic abscess with a small stone adjacent to the liver. The stone was not present on prior imaging and almost certainly represents a retained appendicolith which likely represents the source of the patient's hepatic abscess. She presents for CT-guided drainage of the abscess cavity. EXAM: CT-guided drain placement MEDICATIONS: The patient is currently admitted to the hospital and receiving intravenous antibiotics. The antibiotics were administered within an appropriate time frame prior to the initiation of the procedure. Additionally, 4 mg Zofran were administered intravenously for nausea. ANESTHESIA/SEDATION: Fentanyl 1 mcg IV; Versed 50 mg IV. Moderate Sedation Time:  21 minutes The patient was continuously monitored during the procedure by the interventional radiology nurse under my direct supervision. COMPLICATIONS: None immediate. PROCEDURE: Informed written consent was obtained from the patient after a thorough discussion of the procedural risks, benefits and alternatives. All questions were addressed. Maximal Sterile Barrier Technique was utilized including caps, mask, sterile gowns, sterile gloves, sterile drape, hand hygiene and skin antiseptic. A timeout was performed prior to the initiation of the procedure. A planning CT scan was performed. The low-attenuation collection in the inferior aspect of hepatic segment 6 is easily visualized. The small appendicoliths is again visualized. A suitable skin entry site was selected and marked. After the skin was sterilely prepped and draped in the standard fashion using chlorhexidine skin prep, local anesthesia was attained by infiltration with 1% lidocaine. A small dermatotomy was made. Under intermittent CT guidance, an 18 gauge  trocar needle was advanced through the liver and into the fluid collection. A 0.035 wire was then coiled in the complex collection. The needle was removed. The skin and transhepatic tract were dilated to 10 [place] and a Cook 10.2 [place] all-purpose drainage catheter was advanced over the wire and formed. Aspiration yields approximately 20 cc of foul-smelling thick, purulent fluid. The aspirated fluid was sent for Gram stain and culture. Follow-up CT imaging demonstrates excellent position of the drainage catheter. The catheter was flushed, connected to JP bulb suction and secured to the skin with 0 Prolene suture. The patient tolerated the procedure well. IMPRESSION: Successful placement of a 10 French transhepatic drainage catheter into the inferior right hepatic abscess. Aspiration yields 20 mL of thick, foul-smelling purulent fluid. Samples were sent for culture. PLAN: 1. Maintain drain to JP bulb suction. 2. Flush drain at least once per shift. 3. Follow cultures and adjust antibiotics accordingly. 4. Surgery aware of the presence of the retained appendicoliths. This will represent a continued nidus for infection/abscess formation until removed. Electronically Signed   By: Jacqulynn Cadet M.D.   On: 12/21/2018 10:15     Caroline More, DO 12/24/2018, 10:49 AM PGY-2, [place] Intern pager: [phone], text pages welcome

## 2018-12-24 NOTE — Progress Notes (Signed)
Diagnosis: Liver abcess and polymicrobial bacteremia  Culture Result: Enterobacter and Klebsiella  No Known Allergies  OPAT Orders Discharge antibiotics: IV Ceftazidime with HD  And ORAL flagyl 500 mg po q 8 hours  Duration: 6 weeks End Date:  March 3rd  [place] Per Protocol:  Labs weekly while on IV antibiotics: x__ CBC with differential   Fax weekly labs to [phone]  Clinic Follow Up Appt:   01/24/2019 with Dr. Tommy Medal @ 10 AM at [facility] in [facility]

## 2018-12-24 NOTE — Care Management (Signed)
Acknowledge consult for wound care supplies. Patient with vision impairment, drain, anticipate [facility] RN to assist with wound care. [place] RN would provide wound care supplies.

## 2018-12-24 NOTE — Progress Notes (Signed)
[facility] Progress Note  Dialysis Orders: MWF -[facility] 3.75hrs, [id number], DFRAF 1.5, EDW 90.5kg,2K/2Ca  Access:LU [id number] units Mircera140mcg q2wks - last 11/26/18 Calcitriol71mcg PO qHD  Assessment/Plan: 1. Liver Abscess/eterobacter bacteremia/sepsis; s/p lap appy for gangrenous appendicitis 12/06/18; residual appendicolith noted on CT likely source of hepatic abscess. Per surgery will likely require dx lap in the future to remove this - no surgery planned this admission.- Afebrile now- WBCdec 38.1>23.8> 22.7 .previously Onvanc, cefepime and flagyl- now rocephin and flagyl . S/p IR drained abscess.  Still with loose stools and heartburn- consider other PRNs- per primary team  2. N/v/d - likely related to #1. Ongoing. Meds per primary. 3. ESRD - On HD MWF.requiring added K bath - lower edw for DC- next HD Monday 4. Anemia of CKD - Hgb 8.2> 9.7 ?  Marland KitchenAranesp 100 given 1/17 5. Secondary Hyperparathyroidism - CCa in goal.Phos elevated. Continues VDRA - hold binders for a couple days and resume on Monday due to N, V and diarrhea and poor intake. Has been on 2 Aurixya ac 6. Nutrition - Renal diet w/fluid restrictions. Asking for nepro - hasn't been getting likely due to N,V- will try . Renal vitamins. Alb 2 with very poor intake 7.   DMT2 - per primary 8.   HTN/volume - BP/volume status ok  Net UF 993 ml Friday post wt 88.3 likely losing dry weight due to poor intake.  On cozaar and norvasc as well   Louis Meckel  Subjective:    Did Eat some solid food.  Still with nausea, loose stools but no abdominal pain other than heartburn  Objective Vitals:   12/23/18 1042 12/23/18 1757 12/23/18 2104 12/24/18 0848  BP: (!) 142/66 (!) 148/65 (!) 149/72 (!) 147/73  Pulse: 86 87 91 87  Resp: 16 18  18   Temp: 99.1 F (37.3 C) 98.7 F (37.1 C) 98.4 F (36.9 C) 98.9 F (37.2 C)  TempSrc: Oral Oral Oral Oral  SpO2: 95% 97% 97% 99%  Weight:      Height:        Physical Exam General: NAD but nauseated holding emesis bag Heart: RRR Lungs: grossly clear Abdomen: spft + BS drain on right intact Extremities: no LE edema right BKA Dialysis Access: left upper AVF + bruit   Additional Objective Labs: Basic Metabolic Panel: Recent Labs  Lab 12/20/18 1330 12/21/18 0714 12/22/18 0350 12/24/18 0527  NA  --  139 138 136  K  --  3.4* 3.5 3.9  CL  --  99 99 96*  CO2  --  22 21* 21*  GLUCOSE  --  153* 169* 236*  BUN  --  34* 46* 45*  CREATININE  --  7.76* 9.44* 7.84*  CALCIUM  --  8.0* 8.0* 8.5*  PHOS 6.7*  --   --   --    Liver Function Tests: Recent Labs  Lab 12/19/18 1932 12/20/18 0630 12/21/18 0714  AST 47* 78* 77*  ALT 25 37 39  ALKPHOS 115 95 87  BILITOT 1.3* 0.8 1.4*  PROT 8.2* 6.6 6.2*  ALBUMIN 2.8* 2.2* 2.0*   Recent Labs  Lab 12/19/18 1932  LIPASE 60*   CBC: Recent Labs  Lab 12/20/18 0630 12/21/18 0714 12/22/18 0350 12/23/18 0533 12/24/18 0527  WBC 38.1* 23.8* 21.2* 22.5* 22.7*  HGB 8.6* 8.1* 8.2* 8.7* 9.7*  HCT 28.8* 26.8* 27.2* 28.1* 31.0*  MCV 75.0* 74.2* 74.5* 75.1* 73.6*  PLT 391 322 360 183 335   Blood Culture  Component Value Date/Time   SDES ABSCESS LIVER 12/21/2018 1015   SPECREQUEST Normal 12/21/2018 1015   CULT  12/21/2018 1015    MODERATE KLEBSIELLA OXYTOCA MODERATE ENTEROBACTER CLOACAE SUSCEPTIBILITIES TO FOLLOW NO ANAEROBES ISOLATED; CULTURE IN PROGRESS FOR 5 DAYS    REPTSTATUS PENDING 12/21/2018 1015    Cardiac Enzymes: No results for input(s): CKTOTAL, CKMB, CKMBINDEX, TROPONINI in the last 168 hours. CBG: Recent Labs  Lab 12/23/18 1147 12/23/18 1623 12/23/18 1654 12/23/18 2105 12/24/18 0719  GLUCAP 229* 226* 256* 228* 205*   Iron Studies: No results for input(s): IRON, TIBC, TRANSFERRIN, FERRITIN in the last 72 hours. Lab Results  Component Value Date   INR 1.23 12/20/2018   INR 1.10 08/22/2011   Studies/Results: No results found. Medications: . sodium  chloride 10 mL/hr at 12/21/18 1035  . cefTRIAXone (ROCEPHIN)  IV Stopped (12/24/18 0217)  . metronidazole Stopped (12/24/18 0856)   . amLODipine  10 mg Oral Daily  . atorvastatin  40 mg Oral Daily  . calcitRIOL  1 mcg Oral Daily  . Chlorhexidine Gluconate Cloth  6 each Topical Q0600  . darbepoetin (ARANESP) injection - DIALYSIS  100 mcg Intravenous Q Fri-HD  . feeding supplement (NEPRO CARB STEADY)  237 mL Oral BID BM  . insulin aspart  0-9 Units Subcutaneous TID WC  . losartan  50 mg Oral Daily  . metoprolol tartrate  50 mg Oral BID  . multivitamin  1 tablet Oral QHS  . saccharomyces boulardii  250 mg Oral BID  . scopolamine  1 patch Transdermal Q72H  . sodium chloride flush  5 mL Intracatheter Q8H

## 2018-12-24 NOTE — Progress Notes (Signed)
Subjective: Diarrhea slowly improving.  Nausea with some dry heaving, but seems a little improved.  Had some eggs and bacon this am. No abdominal pain  Objective: Vital signs in last 24 hours: Temp:  [98.4 F (36.9 C)-98.9 F (37.2 C)] 98.9 F (37.2 C) (01/19 0848) Pulse Rate:  [87-91] 87 (01/19 0848) Resp:  [18] 18 (01/19 0848) BP: (147-149)/(65-73) 147/73 (01/19 0848) SpO2:  [97 %-99 %] 99 % (01/19 0848) Last BM Date: 12/23/18  Intake/Output from previous day: 01/18 0701 - 01/19 0700 In: 875 [P.O.:360; IV Piggyback:500] Out: 175 [Emesis/NG output:100; Drains:75] Intake/Output this shift: Total I/O In: 660 [P.O.:360; IV Piggyback:300] Out: -   PE: Abd: soft, drain with slightly cloudy serosang output, +BS, ND, incisions all healed well.  Lab Results:  Recent Labs    12/23/18 0533 12/24/18 0527  WBC 22.5* 22.7*  HGB 8.7* 9.7*  HCT 28.1* 31.0*  PLT 183 335   BMET Recent Labs    12/22/18 0350 12/24/18 0527  NA 138 136  K 3.5 3.9  CL 99 96*  CO2 21* 21*  GLUCOSE 169* 236*  BUN 46* 45*  CREATININE 9.44* 7.84*  CALCIUM 8.0* 8.5*   PT/INR No results for input(s): LABPROT, INR in the last 72 hours. CMP     Component Value Date/Time   NA 136 12/24/2018 0527   NA 139 09/12/2013 0736   K 3.9 12/24/2018 0527   K 5.2 (H) 11/07/2013 0802   CL 96 (L) 12/24/2018 0527   CL 111 (H) 09/12/2013 0736   CO2 21 (L) 12/24/2018 0527   CO2 21 09/12/2013 0736   GLUCOSE 236 (H) 12/24/2018 0527   GLUCOSE 181 (H) 09/12/2013 0736   BUN 45 (H) 12/24/2018 0527   BUN 35 (H) 09/12/2013 0736   CREATININE 7.84 (H) 12/24/2018 0527   CREATININE 1.55 (H) 09/12/2013 0736   CREATININE 0.94 09/12/2012 1113   CALCIUM 8.5 (L) 12/24/2018 0527   CALCIUM 7.6 (L) 03/31/2016 1101   PROT 6.2 (L) 12/21/2018 0714   ALBUMIN 2.0 (L) 12/21/2018 0714   AST 77 (H) 12/21/2018 0714   ALT 39 12/21/2018 0714   ALKPHOS 87 12/21/2018 0714   BILITOT 1.4 (H) 12/21/2018 0714   GFRNONAA 5 (L)  12/24/2018 0527   GFRNONAA 40 (L) 09/12/2013 0736   GFRNONAA 74 09/12/2012 1113   GFRAA 6 (L) 12/24/2018 0527   GFRAA 46 (L) 09/12/2013 0736   GFRAA 85 09/12/2012 1113   Lipase     Component Value Date/Time   LIPASE 60 (H) 12/19/2018 1932       Studies/Results: No results found.  Anti-infectives: Anti-infectives (From admission, onward)   Start     Dose/Rate Route Frequency Ordered Stop   12/25/18 1200  cefTAZidime (FORTAZ) injection 2 g     2 g Intramuscular Every M-W-F (Hemodialysis) 12/24/18 1124     12/24/18 1600  metroNIDAZOLE (FLAGYL) tablet 500 mg     500 mg Oral Every 8 hours 12/24/18 1042     12/22/18 2200  cefTRIAXone (ROCEPHIN) 2 g in sodium chloride 0.9 % 100 mL IVPB  Status:  Discontinued     2 g 200 mL/hr over 30 Minutes Intravenous Every 24 hours 12/22/18 1044 12/24/18 1042   12/20/18 2200  ceFEPIme (MAXIPIME) 1 g in sodium chloride 0.9 % 100 mL IVPB  Status:  Discontinued     1 g 200 mL/hr over 30 Minutes Intravenous Every 24 hours 12/20/18 0126 12/22/18 1044   12/20/18  1200  vancomycin (VANCOCIN) IVPB 1000 mg/200 mL premix  Status:  Discontinued     1,000 mg 200 mL/hr over 60 Minutes Intravenous Every M-W-F (Hemodialysis) 12/20/18 0126 12/21/18 1132   12/19/18 2330  metroNIDAZOLE (FLAGYL) IVPB 500 mg  Status:  Discontinued     500 mg 100 mL/hr over 60 Minutes Intravenous Every 8 hours 12/19/18 2320 12/24/18 1042   12/19/18 2330  ceFEPIme (MAXIPIME) 2 g in sodium chloride 0.9 % 100 mL IVPB     2 g 200 mL/hr over 30 Minutes Intravenous  Once 12/19/18 2324 12/20/18 0053   12/19/18 2330  vancomycin (VANCOCIN) 2,000 mg in sodium chloride 0.9 % 500 mL IVPB     2,000 mg 250 mL/hr over 120 Minutes Intravenous  Once 12/19/18 2324 12/20/18 0407       Assessment/Plan ESRD DM ETOH  HTN HLD  Leukocytosis - 22K today, stable Nausea/vomiting/diarrhea -overall improving.  Eating some Liver abscesses -s/p IR drain placementon 1/16. Leave drain for now,  will need drain clinic follow up as outpatient. -cx shows exact same organisms as blood cultures Bacteremia CX show ENTEROBACTER SPECIES and KLEBSIELLA OXYTOCA  -per medicine  POD 18, s/p lap appy for gangrenous appendicitis1/1/20 Dr. Kieth Brightly - no abdominal pain on my exam - incisions c/d/i -residual appendicolith noted on CT and likely source of hepatic abscess. Patient has follow up set up with Dr. Kieth Brightly as she will likely require dx lap in the future to remove this appendicolith to fully resolve this problem. -no surgery planned this admission  FEN -renal diet as able VTE -SCDs ID -maxipime/flagyl/vanc 1/14>>1/17  Rocephin--> 1/19 Flagyl 1/17 -->  Tressie Ellis 1/19 -->   LOS: 4 days    Henreitta Cea , [facility] 12/24/2018, 12:51 PM Pager: [phone]

## 2018-12-24 NOTE — Consult Note (Signed)
Date of Admission:  12/19/2018          Reason for Consult: Polymicrobial bacteremia with Klebsiella pneumonia and Enterobacter due to retained appendicoliths in the liver and liver abscess after resection of gangrenous appendicitis     Referring Provider: Dr. Mingo Amber   Assessment:  1. Polymicrobial bacteremia with Klebsiella pneumonia and Enterobacter with 2. Multiloculated liver abscess due to 3. Retained appendicoliths in liver after 4. Resection of gangrenous appendix in early January 5. End-stage renal disease on hemodialysis 6. Former alcoholic who is been sober for more than 14 years 7. Legal blindness  Plan:  1. Changed to ceftazidime which can be given with dialysis and oral metronidazole 500 mg 3 times daily 2. We will plan on treating her for 6 weeks at least with systemic antibiotics 3. The liver should be reimaged prior to discontinuing systemic antibiotics 4. She is going to need to have the appendicoliths removed surgically to resolve this problem and until that is done I would want to have her antibiotics so if she is done with her 6 weeks of IV antibiotics would likely change to an oral antibiotic to cover her through her surgery. 5. I will repeat surveillance cultures 6. I will arrange for her to be seen in our infectious disease clinic  Otherwise I will sign off for now please call with further questions.  Active Problems:   ESRD (end stage renal disease) ([place])   Liver abscess   ESRD on dialysis ([place])   Moderate protein-calorie malnutrition ([facility])   Enterococcal bacteremia   Non-intractable vomiting   Scheduled Meds: . amLODipine  10 mg Oral Daily  . atorvastatin  40 mg Oral Daily  . calcitRIOL  1 mcg Oral Daily  . Chlorhexidine Gluconate Cloth  6 each Topical Q0600  . darbepoetin (ARANESP) injection - DIALYSIS  100 mcg Intravenous Q Fri-HD  . feeding supplement (NEPRO CARB STEADY)  237 mL Oral BID BM  . insulin aspart  0-9 Units Subcutaneous TID WC   . losartan  50 mg Oral Daily  . metroNIDAZOLE  500 mg Oral Q8H  . multivitamin  1 tablet Oral QHS  . scopolamine  1 patch Transdermal Q72H  . sodium chloride flush  5 mL Intracatheter Q8H   Continuous Infusions: . sodium chloride 10 mL/hr at 12/21/18 1035   PRN Meds:.sodium chloride, acetaminophen **OR** acetaminophen, promethazine  HPI: [name] is a 53 y.o. female is formerly an alcoholic but been sober for 14 years, ESRD on hemodialysis via fistula who was admitted in early January with gangrenous appendicitis status post appendectomy laparoscopically on December 06, 2018.  The patient returned to the hospital on the 8 January after developing nausea and vomiting diarrhea after dialysis on the 13th.  She was having chills.  She was not able to eat she was having some blood in 1 of her episodes of vomiting.  She came into the ER and had a CT and abdomen pelvis performed which showed a multiloculated cystic structure concerning for an abscess as well as an area of an appendicolith in the liver.  Blood cultures were drawn and the patient was started on antibiotics the form of vancomycin cefepime and metronidazole.  Her blood cultures subsequently grew an Enterobacter and a Klebsiella pneumonia.  She was narrowed to ceftriaxone and metronidazole.  Interventional radiology placed a drain into the hepatic abscess and got a return of 20 mL of foul-smelling material.  This hepatic abscess also grew Enterobacter.  The  patient is going to need to have her appendicoliths removed from the liver but this is not planned for this admission.  We are consulted with regards to duration of antimicrobial therapy.  I would recommend giving her 6 weeks of ceftazidime with hemodialysis which should be active against both the Klebsiella and the Enterobacter species.  I would also continue her on metronidazole but she can take this orally 500 mg 3 times a day.  Since she no longer drinks alcohol this should not  make her sick anymore.  I would want to have repeat imaging of the liver prior to discontinuing IV antibiotics and I would like to her to be on antibiotics of some form prior to surgery to remove her appendicoliths.  I am concerned that otherwise she will experience recurrent infection and morbidity and potential mortality.  I will arrange for hospital follow-up in our clinic, otherwise sign off for now please call with further questions.     Review of Systems: Review of Systems  Constitutional: Positive for chills, diaphoresis and malaise/fatigue. Negative for fever and weight loss.  HENT: Negative for congestion, ear pain, hearing loss and sore throat.   Eyes: Negative for blurred vision and double vision.  Respiratory: Negative for cough, sputum production, shortness of breath, wheezing and stridor.   Cardiovascular: Negative for chest pain, palpitations and leg swelling.  Gastrointestinal: Positive for abdominal pain, diarrhea, nausea and vomiting. Negative for blood in stool, constipation, heartburn and melena.  Genitourinary: Negative for dysuria, flank pain and frequency.  Musculoskeletal: Negative for joint pain and myalgias.  Neurological: Positive for dizziness. Negative for sensory change, focal weakness, loss of consciousness, weakness and headaches.  Endo/Heme/Allergies: Does not bruise/bleed easily.  Psychiatric/Behavioral: Negative for depression, substance abuse and suicidal ideas. The patient does not have insomnia.     Past Medical History:  Diagnosis Date  . Alcohol dependency ([place])   . Anemia   . Chronic kidney disease   . Diabetes mellitus    type 2  . Diabetes mellitus type 2, uncontrolled ([place])   . Hypertension   . Legally blind in left eye, as defined in [place]   . Necrotizing fasciitis ([place])   . PONV (postoperative nausea and vomiting)     Social History   Tobacco Use  . Smoking status: Never Smoker  . Smokeless tobacco: Never Used  Substance Use  Topics  . Alcohol use: Yes    Alcohol/week: 1.0 standard drinks    Types: 1 Cans of beer per week    Comment: h/o alcohol abuse 4 years ago (07/21/15)  . Drug use: Yes    Types: Cocaine    Comment:  last used cocaine 4 years ago (07/21/15)    Family History  Problem Relation Age of Onset  . Diabetes Mother   . Alcohol abuse Mother   . Diabetes Father   . Alzheimer's disease Father   . Hypertension Father   . Kidney failure Sister   . Diabetes Sister   . High blood pressure Sister    No Known Allergies  OBJECTIVE: Blood pressure (!) 147/73, pulse 87, temperature 98.9 F (37.2 C), temperature source Oral, resp. rate 18, height 5\' 7"  (1.702 m), weight 88.3 kg, SpO2 99 %.  Physical Exam Constitutional:      General: She is not in acute distress.    Appearance: Normal appearance. She is well-developed. She is not ill-appearing or diaphoretic.  HENT:     Head: Normocephalic and atraumatic.     Right  Ear: Hearing and external ear normal.     Left Ear: Hearing and external ear normal.     Nose: No nasal deformity or rhinorrhea.  Eyes:     General: No scleral icterus.    Conjunctiva/sclera: Conjunctivae normal.     Right eye: Right conjunctiva is not injected.     Left eye: Left conjunctiva is not injected.  Neck:     Musculoskeletal: Normal range of motion and neck supple.     Vascular: No JVD.  Cardiovascular:     Rate and Rhythm: Normal rate and regular rhythm.     Heart sounds: S1 normal and S2 normal.  Pulmonary:     Effort: Pulmonary effort is normal. No respiratory distress.  Abdominal:     General: Bowel sounds are normal. There is no distension.     Palpations: Abdomen is soft.     Tenderness: There is abdominal tenderness. There is no guarding.  Musculoskeletal: Normal range of motion.     Right shoulder: Normal.     Left shoulder: Normal.     Right hip: Normal.     Left hip: Normal.     Right knee: Normal.     Left knee: Normal.  Lymphadenopathy:     Head:      Right side of head: No submandibular, preauricular or posterior auricular adenopathy.     Left side of head: No submandibular, preauricular or posterior auricular adenopathy.     Cervical: No cervical adenopathy.     Right cervical: No superficial or deep cervical adenopathy.    Left cervical: No superficial or deep cervical adenopathy.  Skin:    General: Skin is warm and dry.     Coloration: Skin is not pale.     Findings: No abrasion, bruising, ecchymosis, erythema, lesion or rash.     Nails: There is no clubbing.   Neurological:     General: No focal deficit present.     Mental Status: She is alert and oriented to person, place, and time.     Sensory: No sensory deficit.  Psychiatric:        Attention and Perception: She is attentive.        Mood and Affect: Mood normal.        Speech: Speech normal.        Behavior: Behavior normal. Behavior is cooperative.        Thought Content: Thought content normal.        Judgment: Judgment normal.    He has a Jackson-Pratt drain with purulent material in the bulb on the right side.  Lab Results Lab Results  Component Value Date   WBC 22.7 (H) 12/24/2018   HGB 9.7 (L) 12/24/2018   HCT 31.0 (L) 12/24/2018   MCV 73.6 (L) 12/24/2018   PLT 335 12/24/2018    Lab Results  Component Value Date   CREATININE 7.84 (H) 12/24/2018   BUN 45 (H) 12/24/2018   NA 136 12/24/2018   K 3.9 12/24/2018   CL 96 (L) 12/24/2018   CO2 21 (L) 12/24/2018    Lab Results  Component Value Date   ALT 39 12/21/2018   AST 77 (H) 12/21/2018   ALKPHOS 87 12/21/2018   BILITOT 1.4 (H) 12/21/2018     Microbiology: Recent Results (from the past 240 hour(s))  Blood culture (routine x 2)     Status: Abnormal (Preliminary result)   Collection Time: 12/20/18 12:20 AM  Result Value Ref Range Status   Specimen  Description BLOOD LEFT NECK  Final   Special Requests   Final    BOTTLES DRAWN AEROBIC AND ANAEROBIC Blood Culture adequate volume   Culture   Setup Time (A)  Final    GRAM VARIABLE ROD ANAEROBIC BOTTLE ONLY CRITICAL RESULT CALLED TO, READ BACK BY AND VERIFIED WITH: PHARMD E DEJA [id number] Performed at [facility], [place] [address], [place], [place] [address]    Culture GRAM VARIABLE ROD (A)  Final   Report Status PENDING  Incomplete  Blood culture (routine x 2)     Status: Abnormal   Collection Time: 12/20/18 12:21 AM  Result Value Ref Range Status   Specimen Description BLOOD RIGHT ARM  Final   Special Requests   Final    BOTTLES DRAWN AEROBIC AND ANAEROBIC Blood Culture adequate volume   Culture  Setup Time   Final    GRAM NEGATIVE RODS GRAM POSITIVE RODS IN BOTH AEROBIC AND ANAEROBIC BOTTLES CRITICAL RESULT CALLED TO, READ BACK BY AND VERIFIED WITH: Hyman Bible PharmD 16:05 12/20/18 ([email or web address]) Performed at [facility], [place] [address] [place], [place] [address]    Culture (A)  Final    ENTEROBACTER SPECIES KLEBSIELLA OXYTOCA GRAM POSITIVE RODS NON VIABLE    Report Status 12/23/2018 FINAL  Final   Organism ID, Bacteria ENTEROBACTER SPECIES  Final   Organism ID, Bacteria KLEBSIELLA OXYTOCA  Final      Susceptibility   Enterobacter species - MIC*    CEFAZOLIN RESISTANT Resistant     CEFEPIME <=1 SENSITIVE Sensitive     CEFTAZIDIME <=1 SENSITIVE Sensitive     CEFTRIAXONE <=1 SENSITIVE Sensitive     CIPROFLOXACIN <=0.25 SENSITIVE Sensitive     GENTAMICIN <=1 SENSITIVE Sensitive     IMIPENEM <=0.25 SENSITIVE Sensitive     TRIMETH/SULFA <=20 SENSITIVE Sensitive     PIP/TAZO <=4 SENSITIVE Sensitive     * ENTEROBACTER SPECIES   Klebsiella oxytoca - MIC*    AMPICILLIN RESISTANT Resistant     CEFAZOLIN <=4 SENSITIVE Sensitive     CEFEPIME <=1 SENSITIVE Sensitive     CEFTAZIDIME <=1 SENSITIVE Sensitive     CEFTRIAXONE <=1 SENSITIVE Sensitive     CIPROFLOXACIN <=0.25 SENSITIVE Sensitive     GENTAMICIN <=1 SENSITIVE Sensitive     IMIPENEM <=0.25 SENSITIVE Sensitive     TRIMETH/SULFA <=20 SENSITIVE  Sensitive     AMPICILLIN/SULBACTAM <=2 SENSITIVE Sensitive     PIP/TAZO <=4 SENSITIVE Sensitive     Extended ESBL NEGATIVE Sensitive     * KLEBSIELLA OXYTOCA  Blood Culture ID Panel (Reflexed)     Status: Abnormal   Collection Time: 12/20/18 12:21 AM  Result Value Ref Range Status   Enterococcus species NOT DETECTED NOT DETECTED Final   Listeria monocytogenes NOT DETECTED NOT DETECTED Final   Staphylococcus species NOT DETECTED NOT DETECTED Final   Staphylococcus aureus (BCID) NOT DETECTED NOT DETECTED Final   Streptococcus species NOT DETECTED NOT DETECTED Final   Streptococcus agalactiae NOT DETECTED NOT DETECTED Final   Streptococcus pneumoniae NOT DETECTED NOT DETECTED Final   Streptococcus pyogenes NOT DETECTED NOT DETECTED Final   Acinetobacter baumannii NOT DETECTED NOT DETECTED Final   Enterobacteriaceae species DETECTED (A) NOT DETECTED Final    Comment: Enterobacteriaceae represent a large family of gram-negative bacteria, not a single organism. CRITICAL RESULT CALLED TO, READ BACK BY AND VERIFIED WITH: Hyman Bible PharmD 16:05 12/20/18 ([email or web address])    Enterobacter cloacae complex DETECTED (A) NOT DETECTED Final  Comment: CRITICAL RESULT CALLED TO, READ BACK BY AND VERIFIED WITH: Hyman Bible PharmD 16:05 12/20/18 ([email or web address])    Escherichia coli NOT DETECTED NOT DETECTED Final   Klebsiella oxytoca NOT DETECTED NOT DETECTED Final   Klebsiella pneumoniae NOT DETECTED NOT DETECTED Final   Proteus species NOT DETECTED NOT DETECTED Final   Serratia marcescens NOT DETECTED NOT DETECTED Final   Carbapenem resistance NOT DETECTED NOT DETECTED Final   Haemophilus influenzae NOT DETECTED NOT DETECTED Final   Neisseria meningitidis NOT DETECTED NOT DETECTED Final   Pseudomonas aeruginosa NOT DETECTED NOT DETECTED Final   Candida albicans NOT DETECTED NOT DETECTED Final   Candida glabrata NOT DETECTED NOT DETECTED Final   Candida krusei NOT DETECTED NOT DETECTED Final   Candida parapsilosis  NOT DETECTED NOT DETECTED Final   Candida tropicalis NOT DETECTED NOT DETECTED Final    Comment: Performed at [facility], [place] [address], [place], [place] [address]  Aerobic/Anaerobic Culture (surgical/deep wound)     Status: None (Preliminary result)   Collection Time: 12/21/18 10:15 AM  Result Value Ref Range Status   Specimen Description ABSCESS LIVER  Final   Special Requests Normal  Final   Gram Stain   Final    ABUNDANT WBC PRESENT, PREDOMINANTLY PMN MODERATE GRAM NEGATIVE RODS MODERATE GRAM POSITIVE RODS MODERATE GRAM POSITIVE COCCI Performed at [facility], [place] [address], [place], [place] [address]    Culture   Final    MODERATE KLEBSIELLA OXYTOCA MODERATE ENTEROBACTER CLOACAE SUSCEPTIBILITIES TO FOLLOW NO ANAEROBES ISOLATED; CULTURE IN PROGRESS FOR 5 DAYS    Report Status PENDING  Incomplete   Organism ID, Bacteria ENTEROBACTER CLOACAE  Final      Susceptibility   Enterobacter cloacae - MIC*    CEFAZOLIN RESISTANT Resistant     CEFEPIME <=1 SENSITIVE Sensitive     CEFTAZIDIME <=1 SENSITIVE Sensitive     CEFTRIAXONE <=1 SENSITIVE Sensitive     CIPROFLOXACIN <=0.25 SENSITIVE Sensitive     GENTAMICIN <=1 SENSITIVE Sensitive     IMIPENEM <=0.25 SENSITIVE Sensitive     TRIMETH/SULFA <=20 SENSITIVE Sensitive     PIP/TAZO <=4 SENSITIVE Sensitive     * MODERATE ENTEROBACTER CLOACAE  C difficile quick scan w PCR reflex     Status: None   Collection Time: 12/21/18  2:59 PM  Result Value Ref Range Status   C Diff antigen NEGATIVE NEGATIVE Final   C Diff toxin NEGATIVE NEGATIVE Final   C Diff interpretation No C. difficile detected.  Final    Comment: Performed at [facility], [place] [address] [place], [place] [address]  Gastrointestinal Panel by PCR , Stool     Status: None   Collection Time: 12/21/18  2:59 PM  Result Value Ref Range Status   Campylobacter species NOT DETECTED NOT DETECTED Final   Plesimonas shigelloides NOT DETECTED NOT DETECTED  Final   Salmonella species NOT DETECTED NOT DETECTED Final   Yersinia enterocolitica NOT DETECTED NOT DETECTED Final   Vibrio species NOT DETECTED NOT DETECTED Final   Vibrio cholerae NOT DETECTED NOT DETECTED Final   Enteroaggregative E coli (EAEC) NOT DETECTED NOT DETECTED Final   Enteropathogenic E coli (EPEC) NOT DETECTED NOT DETECTED Final   Enterotoxigenic E coli (ETEC) NOT DETECTED NOT DETECTED Final   Shiga like toxin producing E coli (STEC) NOT DETECTED NOT DETECTED Final   Shigella/Enteroinvasive E coli (EIEC) NOT DETECTED NOT DETECTED Final   Cryptosporidium NOT DETECTED NOT DETECTED Final  Cyclospora cayetanensis NOT DETECTED NOT DETECTED Final   Entamoeba histolytica NOT DETECTED NOT DETECTED Final   Giardia lamblia NOT DETECTED NOT DETECTED Final   Adenovirus F40/41 NOT DETECTED NOT DETECTED Final   Astrovirus NOT DETECTED NOT DETECTED Final   Norovirus GI/GII NOT DETECTED NOT DETECTED Final   Rotavirus A NOT DETECTED NOT DETECTED Final   Sapovirus (I, II, IV, and V) NOT DETECTED NOT DETECTED Final    Comment: Performed at [facility], [address], [place], [place] [address]    Cornelius Van Dam, [facility] [phone] pager  12/24/2018, 10:45 AM

## 2018-12-24 NOTE — Progress Notes (Signed)
PHARMACY CONSULT NOTE FOR:  OUTPATIENT  PARENTERAL ANTIBIOTIC THERAPY (OPAT)  Indication: Liver abcess and polymicrobial bacteremia  Regimen:  Ceftazidime 2g IV with HD Flagyl 500mg  PO every 8 hours  End date: March 3rd  IV antibiotic discharge orders are pended. To discharging provider:  please sign these orders via discharge navigator,  Select New Orders & click on the button choice - Manage This Unsigned Work.    Thank you for allowing pharmacy to be a part of this patient's care.  Gwenlyn Found, Sherian Rein D PGY1 Pharmacy Resident  Phone [phone] 12/24/2018   2:59 PM

## 2018-12-25 ENCOUNTER — Inpatient Hospital Stay (HOSPITAL_COMMUNITY): Payer: Medicare Other

## 2018-12-25 ENCOUNTER — Encounter (HOSPITAL_COMMUNITY): Payer: Self-pay | Admitting: Radiology

## 2018-12-25 LAB — CBC
HCT: 30.5 % — ABNORMAL LOW (ref 36.0–46.0)
Hemoglobin: 9.2 g/dL — ABNORMAL LOW (ref 12.0–15.0)
MCH: 22.1 pg — ABNORMAL LOW (ref 26.0–34.0)
MCHC: 30.2 g/dL (ref 30.0–36.0)
MCV: 73.3 fL — ABNORMAL LOW (ref 80.0–100.0)
Platelets: 410 10*3/uL — ABNORMAL HIGH (ref 150–400)
RBC: 4.16 MIL/uL (ref 3.87–5.11)
RDW: 18 % — ABNORMAL HIGH (ref 11.5–15.5)
WBC: 21.1 10*3/uL — ABNORMAL HIGH (ref 4.0–10.5)
nRBC: 0.1 % (ref 0.0–0.2)

## 2018-12-25 LAB — BASIC METABOLIC PANEL
Anion gap: 18 — ABNORMAL HIGH (ref 5–15)
BUN: 59 mg/dL — ABNORMAL HIGH (ref 6–20)
CO2: 20 mmol/L — ABNORMAL LOW (ref 22–32)
Calcium: 8.5 mg/dL — ABNORMAL LOW (ref 8.9–10.3)
Chloride: 97 mmol/L — ABNORMAL LOW (ref 98–111)
Creatinine, Ser: 9.67 mg/dL — ABNORMAL HIGH (ref 0.44–1.00)
GFR calc Af Amer: 5 mL/min — ABNORMAL LOW (ref >60)
GFR calc non Af Amer: 4 mL/min — ABNORMAL LOW (ref >60)
Glucose, Bld: 236 mg/dL — ABNORMAL HIGH (ref 70–99)
Potassium: 3.6 mmol/L (ref 3.5–5.1)
Sodium: 135 mmol/L (ref 135–145)

## 2018-12-25 LAB — HEMOGLOBIN A1C
Hgb A1c MFr Bld: 8.4 % — ABNORMAL HIGH (ref 4.8–5.6)
Mean Plasma Glucose: 194.38 mg/dL

## 2018-12-25 LAB — GLUCOSE, CAPILLARY
Glucose-Capillary: 242 mg/dL — ABNORMAL HIGH (ref 70–99)
Glucose-Capillary: 110 mg/dL — ABNORMAL HIGH (ref 70–99)
Glucose-Capillary: 117 mg/dL — ABNORMAL HIGH (ref 70–99)

## 2018-12-25 MED ORDER — FERRIC CITRATE 1 GM 210 MG(FE) PO TABS
210.0000 mg | ORAL_TABLET | Freq: Three times a day (TID) | ORAL | Status: DC
  Administered 2018-12-26 (×2): 210 mg via ORAL
  Filled 2018-12-25 (×3): qty 1

## 2018-12-25 MED ORDER — CALCITRIOL 0.5 MCG PO CAPS
ORAL_CAPSULE | ORAL | Status: AC
  Administered 2018-12-25: 1 ug via ORAL
  Filled 2018-12-25: qty 2

## 2018-12-25 MED ORDER — HEPARIN SODIUM (PORCINE) 1000 UNIT/ML DIALYSIS
20.0000 [IU]/kg | INTRAMUSCULAR | Status: DC | PRN
  Administered 2018-12-25: 1800 [IU] via INTRAVENOUS_CENTRAL
  Filled 2018-12-25: qty 1.8

## 2018-12-25 MED ORDER — INSULIN DETEMIR 100 UNIT/ML ~~LOC~~ SOLN
9.0000 [IU] | Freq: Every day | SUBCUTANEOUS | Status: DC
  Administered 2018-12-25 – 2018-12-26 (×2): 9 [IU] via SUBCUTANEOUS
  Filled 2018-12-25 (×2): qty 0.09

## 2018-12-25 MED ORDER — IOHEXOL 300 MG/ML  SOLN
100.0000 mL | Freq: Once | INTRAMUSCULAR | Status: AC | PRN
  Administered 2018-12-25: 100 mL via INTRAVENOUS

## 2018-12-25 MED ORDER — PRO-STAT SUGAR FREE PO LIQD
30.0000 mL | Freq: Two times a day (BID) | ORAL | Status: DC
  Administered 2018-12-25 – 2018-12-26 (×2): 30 mL via ORAL
  Filled 2018-12-25 (×2): qty 30

## 2018-12-25 MED ORDER — HEPARIN SODIUM (PORCINE) 1000 UNIT/ML IJ SOLN
INTRAMUSCULAR | Status: AC
  Administered 2018-12-25: 1800 [IU] via INTRAVENOUS_CENTRAL
  Filled 2018-12-25: qty 2

## 2018-12-25 NOTE — Progress Notes (Signed)
Referring Physician(s): Dr. Mingo Amber  Supervising Physician: Markus Daft  Patient Status:  [facility] - In-pt  Chief Complaint: Follow up liver abscess drain placed 12/21/18 by Dr. Laurence Ferrari  Subjective: Patient reports she feels ok, not having any pain.   Allergies: Patient has no known allergies.  Medications:  Current Facility-Administered Medications:  .  0.9 %  sodium chloride infusion, , Intravenous, PRN, Alveda Reasons, MD, Last Rate: 10 mL/hr at 12/21/18 1035 .  acetaminophen (TYLENOL) tablet 650 mg, 650 mg, Oral, Q6H PRN, 650 mg at 12/21/18 2147 **OR** acetaminophen (TYLENOL) suppository 650 mg, 650 mg, Rectal, Q6H PRN, Meccariello, Bernita Raisin, DO, 650 mg at 12/20/18 1826 .  amLODipine (NORVASC) tablet 10 mg, 10 mg, Oral, Daily, Meccariello, Bernita Raisin, DO, 10 mg at 12/24/18 0932 .  atorvastatin (LIPITOR) tablet 40 mg, 40 mg, Oral, Daily, Meccariello, Bailey J, DO, 40 mg at 12/24/18 0932 .  calcitRIOL (ROCALTROL) capsule 1 mcg, 1 mcg, Oral, Daily, Penninger, Lindsay, [place], 1 mcg at 12/24/18 0932 .  cefTAZidime (FORTAZ) 2 g in sodium chloride 0.9 % 100 mL IVPB, 2 g, Intravenous, Q M,W,F-HD, Alveda Reasons, MD .  Chlorhexidine Gluconate Cloth 2 % PADS 6 each, 6 each, Topical, Q0600, Corliss Parish, MD, 6 each at 12/23/18 1040 .  Darbepoetin Alfa (ARANESP) injection 100 mcg, 100 mcg, Intravenous, Q Fri-HD, Penninger, Lindsay, PA, 100 mcg at 12/22/18 1108 .  feeding supplement (NEPRO CARB STEADY) liquid 237 mL, 237 mL, Oral, BID BM, Alveda Reasons, MD .  insulin aspart (novoLOG) injection 0-9 Units, 0-9 Units, Subcutaneous, TID WC, Meccariello, Bernita Raisin, DO, 3 Units at 12/25/18 0755 .  losartan (COZAAR) tablet 50 mg, 50 mg, Oral, Daily, Meccariello, Bernita Raisin, DO, 50 mg at 12/24/18 0932 .  metroNIDAZOLE (FLAGYL) tablet 500 mg, 500 mg, Oral, Q8H, Tommy Medal, Lavell Islam, MD, 500 mg at 12/25/18 0531 .  multivitamin (RENA-VIT) tablet 1 tablet, 1 tablet, Oral, QHS, Alveda Reasons,  MD, 1 tablet at 12/24/18 2254 .  pantoprazole (PROTONIX) EC tablet 40 mg, 40 mg, Oral, Daily, Saverio Danker, PA-C, 40 mg at 12/25/18 0800 .  promethazine (PHENERGAN) injection 12.5 mg, 12.5 mg, Intravenous, Q6H PRN, Mullis, Kiersten P, DO, 12.5 mg at 12/24/18 0937 .  scopolamine (TRANSDERM-SCOP) 1 MG/3DAYS 1.5 mg, 1 patch, Transdermal, Q72H, Oberlin Bing, DO, 1.5 mg at 12/22/18 1441 .  sodium chloride flush (NS) 0.9 % injection 5 mL, 5 mL, Intracatheter, Q8H, Jacqulynn Cadet, MD, 5 mL at 12/25/18 0529    Vital Signs: BP (!) 153/79 (BP Location: Left Leg)   Pulse 88   Temp 98.3 F (36.8 C) (Oral)   Resp 20   Ht 5\' 7"  (1.702 m)   Wt 89.7 kg   SpO2 100%   BMI 30.97 kg/m   Physical Exam Vitals signs and nursing note reviewed.  Constitutional:      General: She is not in acute distress. HENT:     Head: Normocephalic.  Cardiovascular:     Rate and Rhythm: Normal rate.  Pulmonary:     Effort: Pulmonary effort is normal.  Abdominal:     Palpations: Abdomen is soft.     Tenderness: There is no abdominal tenderness.     Comments: RUQ drain to JP. Insertion site clean, dry, dressed appropriately without erythema, edema, drainage or active bleeding.  Thin dark liquid OP.  Skin:    General: Skin is warm and dry.  Neurological:     Mental Status: She is alert.  Mental status is at baseline.     Imaging: No results found.  Labs:  CBC: Recent Labs    12/22/18 0350 12/23/18 0533 12/24/18 0527 12/25/18 0332  WBC 21.2* 22.5* 22.7* 21.1*  HGB 8.2* 8.7* 9.7* 9.2*  HCT 27.2* 28.1* 31.0* 30.5*  PLT 360 183 335 410*    COAGS: Recent Labs    12/20/18 0019  INR 1.23    BMP: Recent Labs    12/21/18 0714 12/22/18 0350 12/24/18 0527 12/25/18 0332  NA 139 138 136 135  K 3.4* 3.5 3.9 3.6  CL 99 99 96* 97*  CO2 22 21* 21* 20*  GLUCOSE 153* 169* 236* 236*  BUN 34* 46* 45* 59*  CALCIUM 8.0* 8.0* 8.5* 8.5*  CREATININE 7.76* 9.44* 7.84* 9.67*  GFRNONAA 5* 4* 5* 4*   GFRAA 6* 5* 6* 5*    LIVER FUNCTION TESTS: Recent Labs    12/06/18 1808 12/08/18 0723 12/19/18 1932 12/20/18 0630 12/21/18 0714  BILITOT 0.4  --  1.3* 0.8 1.4*  AST 15  --  47* 78* 77*  ALT 12  --  25 37 39  ALKPHOS 70  --  115 95 87  PROT 7.4  --  8.2* 6.6 6.2*  ALBUMIN 3.5 2.5* 2.8* 2.2* 2.0*    Assessment and Plan:  Patient s/p liver abscess drain placement 12/21/18 by Dr. Laurence Ferrari Feeling better. Repeat CT ordered for today by [facility]  Electronically Signed: Ascencion Dike, PA-C 12/25/2018, 10:04 AM   I spent a total of 25 Minutes at the the patient's bedside AND on the patient's hospital floor or unit, greater than 50% of which was counseling/coordinating care for liver abscess drain follow up.

## 2018-12-25 NOTE — Progress Notes (Addendum)
[facility] Progress Note  Dialysis Orders: MWF -East 3.75hrs, [id number], DFRAF 1.5, EDW 90.5kg,2K/2Ca  Access:LU [id number] units Mircera188mcg q2wks - last 11/26/18 Calcitriol87mcg PO qHD  Assessment/Plan: 1. Liver Abscess/eterobacter bacteremia/sepsis; s/p lap appy for gangrenous appendicitis 12/06/18; residual appendicolith noted on CT likely source of hepatic abscess. Per surgery will likely require dx lap in the future to remove this - no surgery planned this admission.- Afebrile WBC trending down 38.1>23.8> 22.7>21.1 Changed to fortaz and continues on flagyl . S/p IR drained abscess.  For abdominal CT today due to persistent ^ WBC 2. N/v/d - likely related to #1. Ongoing. Meds per primary.Overall better.  3. ESRD - On HD MWF.requiring added K bath - lower edw for DC- next HD today 4. Anemia of CKD - Hgb 8.2> 9.7>9.2 .Aranesp 100 given 1/17 5. Secondary Hyperparathyroidism - CCa in goal.Phos elevated. Continues VDRA- hold binders for a couple days and resume on Tuesday at 1 ac- previously held due to  due to N, V and diarrhea and poor intake. Has been on 2 Aurixya ac 6.   Nutrition - On regular diet to improve intake - advised to avoid fatty food. Supplements/vitamin ordered 7.DMT2 - per primary 8. HTN/volume - BP/volume status ok  Net UF 993 ml Friday post wt 88.3 likely losing dry weight due to poor intake.  On cozaar and norvasc as well    Myriam Jacobson, PA-C [place] 12/25/2018,10:30 AM  LOS: 12 days   Pt seen, examined and agree w A/P as above.  Kelly Splinter MD [employer] pager [phone]   12/25/2018, 11:57 AM      Subjective:   Keeping food down but didn't tolerate bacon this am and thought grits were not done.  For CT today.  Objective Vitals:   12/24/18 1633 12/24/18 1943 12/24/18 2130 12/25/18 0910  BP: 130/68 (!) 145/77  (!) 153/79  Pulse: 82 87  88  Resp: 18 18   20   Temp: 98.4 F (36.9 C) 98.9 F (37.2 C)  98.3 F (36.8 C)  TempSrc: Oral Oral  Oral  SpO2: 99% 95%  100%  Weight:   89.7 kg   Height:       Physical Exam General: Blind NAD talking on the phone - overall looks better Heart: RRR Lungs: no rales Abdomen: soft NT - JP drain intact Extremities: no LLE edema /right BKA no edema Dialysis Access:  Left upper AVF + bruit   Additional Objective Labs: Basic Metabolic Panel: Recent Labs  Lab 12/20/18 1330  12/22/18 0350 12/24/18 0527 12/25/18 0332  NA  --    < > 138 136 135  K  --    < > 3.5 3.9 3.6  CL  --    < > 99 96* 97*  CO2  --    < > 21* 21* 20*  GLUCOSE  --    < > 169* 236* 236*  BUN  --    < > 46* 45* 59*  CREATININE  --    < > 9.44* 7.84* 9.67*  CALCIUM  --    < > 8.0* 8.5* 8.5*  PHOS 6.7*  --   --   --   --    < > = values in this interval not displayed.   Liver Function Tests: Recent Labs  Lab 12/19/18 1932 12/20/18 0630 12/21/18 0714  AST 47* 78* 77*  ALT 25 37 39  ALKPHOS 115 95 87  BILITOT 1.3* 0.8  1.4*  PROT 8.2* 6.6 6.2*  ALBUMIN 2.8* 2.2* 2.0*   Recent Labs  Lab 12/19/18 1932  LIPASE 60*   CBC: Recent Labs  Lab 12/21/18 0714 12/22/18 0350 12/23/18 0533 12/24/18 0527 12/25/18 0332  WBC 23.8* 21.2* 22.5* 22.7* 21.1*  HGB 8.1* 8.2* 8.7* 9.7* 9.2*  HCT 26.8* 27.2* 28.1* 31.0* 30.5*  MCV 74.2* 74.5* 75.1* 73.6* 73.3*  PLT 322 360 183 335 410*   Blood Culture    Component Value Date/Time   SDES BLOOD RIGHT HAND 12/24/2018 1308   SPECREQUEST  12/24/2018 1308    BOTTLES DRAWN AEROBIC AND ANAEROBIC Blood Culture adequate volume   CULT  12/24/2018 1308    NO GROWTH < 24 HOURS Performed at [facility], [place] [address] [place], [place] [address]    REPTSTATUS PENDING 12/24/2018 1308    Cardiac Enzymes: No results for input(s): CKTOTAL, CKMB, CKMBINDEX, TROPONINI in the last 168 hours. CBG: Recent Labs  Lab 12/24/18 0719 12/24/18 1127 12/24/18 1633 12/24/18 2129  12/25/18 0739  GLUCAP 205* 274* 283* 241* 242*   Iron Studies: No results for input(s): IRON, TIBC, TRANSFERRIN, FERRITIN in the last 72 hours. Lab Results  Component Value Date   INR 1.23 12/20/2018   INR 1.10 08/22/2011   Studies/Results: No results found. Medications: . sodium chloride 10 mL/hr at 12/21/18 1035  . cefTAZidime (FORTAZ)  IV     . amLODipine  10 mg Oral Daily  . atorvastatin  40 mg Oral Daily  . calcitRIOL  1 mcg Oral Daily  . Chlorhexidine Gluconate Cloth  6 each Topical Q0600  . darbepoetin (ARANESP) injection - DIALYSIS  100 mcg Intravenous Q Fri-HD  . feeding supplement (NEPRO CARB STEADY)  237 mL Oral BID BM  . insulin aspart  0-9 Units Subcutaneous TID WC  . losartan  50 mg Oral Daily  . metroNIDAZOLE  500 mg Oral Q8H  . multivitamin  1 tablet Oral QHS  . pantoprazole  40 mg Oral Daily  . scopolamine  1 patch Transdermal Q72H  . sodium chloride flush  5 mL Intracatheter Q8H

## 2018-12-25 NOTE — Progress Notes (Signed)
Subjective: Diarrhea improving.  Nausea, no emesis, but seems a little improved.  Had some  bacon this am, nothing else.   Objective: Vital signs in last 24 hours: Temp:  [98.4 F (36.9 C)-98.9 F (37.2 C)] 98.9 F (37.2 C) (01/19 1943) Pulse Rate:  [82-87] 87 (01/19 1943) Resp:  [18] 18 (01/19 1943) BP: (130-147)/(68-77) 145/77 (01/19 1943) SpO2:  [95 %-99 %] 95 % (01/19 1943) Weight:  [89.7 kg] 89.7 kg (01/19 2130) Last BM Date: 12/24/18  Intake/Output from previous day: 01/19 0701 - 01/20 0700 In: 1025 [P.O.:720; I.V.:5; IV Piggyback:300] Out: 30 [Drains:30] Intake/Output this shift: No intake/output data recorded.  PE: Abd: soft, drain with slightly cloudy serosang output, +BS, ND, incisions all healed well.  Lab Results:  Recent Labs    12/24/18 0527 12/25/18 0332  WBC 22.7* 21.1*  HGB 9.7* 9.2*  HCT 31.0* 30.5*  PLT 335 410*   BMET Recent Labs    12/24/18 0527 12/25/18 0332  NA 136 135  K 3.9 3.6  CL 96* 97*  CO2 21* 20*  GLUCOSE 236* 236*  BUN 45* 59*  CREATININE 7.84* 9.67*  CALCIUM 8.5* 8.5*   PT/INR No results for input(s): LABPROT, INR in the last 72 hours. CMP     Component Value Date/Time   NA 135 12/25/2018 0332   NA 139 09/12/2013 0736   K 3.6 12/25/2018 0332   K 5.2 (H) 11/07/2013 0802   CL 97 (L) 12/25/2018 0332   CL 111 (H) 09/12/2013 0736   CO2 20 (L) 12/25/2018 0332   CO2 21 09/12/2013 0736   GLUCOSE 236 (H) 12/25/2018 0332   GLUCOSE 181 (H) 09/12/2013 0736   BUN 59 (H) 12/25/2018 0332   BUN 35 (H) 09/12/2013 0736   CREATININE 9.67 (H) 12/25/2018 0332   CREATININE 1.55 (H) 09/12/2013 0736   CREATININE 0.94 09/12/2012 1113   CALCIUM 8.5 (L) 12/25/2018 0332   CALCIUM 7.6 (L) 03/31/2016 1101   PROT 6.2 (L) 12/21/2018 0714   ALBUMIN 2.0 (L) 12/21/2018 0714   AST 77 (H) 12/21/2018 0714   ALT 39 12/21/2018 0714   ALKPHOS 87 12/21/2018 0714   BILITOT 1.4 (H) 12/21/2018 0714   GFRNONAA 4 (L) 12/25/2018 0332   GFRNONAA 40 (L) 09/12/2013 0736   GFRNONAA 74 09/12/2012 1113   GFRAA 5 (L) 12/25/2018 0332   GFRAA 46 (L) 09/12/2013 0736   GFRAA 85 09/12/2012 1113   Lipase     Component Value Date/Time   LIPASE 60 (H) 12/19/2018 1932       Studies/Results: No results found.  Anti-infectives: Anti-infectives (From admission, onward)   Start     Dose/Rate Route Frequency Ordered Stop   12/25/18 1200  cefTAZidime (FORTAZ) injection 2 g  Status:  Discontinued     2 g Intramuscular Every M-W-F (Hemodialysis) 12/24/18 1124 12/24/18 1457   12/25/18 1200  cefTAZidime (FORTAZ) 2 g in sodium chloride 0.9 % 100 mL IVPB     2 g 200 mL/hr over 30 Minutes Intravenous Every M-W-F (Hemodialysis) 12/24/18 1457     12/24/18 1600  metroNIDAZOLE (FLAGYL) tablet 500 mg     500 mg Oral Every 8 hours 12/24/18 1042     12/22/18 2200  cefTRIAXone (ROCEPHIN) 2 g in sodium chloride 0.9 % 100 mL IVPB  Status:  Discontinued     2 g 200 mL/hr over 30 Minutes Intravenous Every 24 hours 12/22/18 1044 12/24/18 1042   12/20/18 2200  ceFEPIme (MAXIPIME)  1 g in sodium chloride 0.9 % 100 mL IVPB  Status:  Discontinued     1 g 200 mL/hr over 30 Minutes Intravenous Every 24 hours 12/20/18 0126 12/22/18 1044   12/20/18 1200  vancomycin (VANCOCIN) IVPB 1000 mg/200 mL premix  Status:  Discontinued     1,000 mg 200 mL/hr over 60 Minutes Intravenous Every M-W-F (Hemodialysis) 12/20/18 0126 12/21/18 1132   12/19/18 2330  metroNIDAZOLE (FLAGYL) IVPB 500 mg  Status:  Discontinued     500 mg 100 mL/hr over 60 Minutes Intravenous Every 8 hours 12/19/18 2320 12/24/18 1042   12/19/18 2330  ceFEPIme (MAXIPIME) 2 g in sodium chloride 0.9 % 100 mL IVPB     2 g 200 mL/hr over 30 Minutes Intravenous  Once 12/19/18 2324 12/20/18 0053   12/19/18 2330  vancomycin (VANCOCIN) 2,000 mg in sodium chloride 0.9 % 500 mL IVPB     2,000 mg 250 mL/hr over 120 Minutes Intravenous  Once 12/19/18 2324 12/20/18 0407        Assessment/Plan ESRD DM ETOH  HTN HLD  Leukocytosis - 22K today, stable Nausea/vomiting/diarrhea -overall improving.  Eating some Liver abscesses -s/p IR drain placementon 1/16. Leave drain for now, will need drain clinic follow up as outpatient. -cx shows exact same organisms as blood cultures -Repeat CT a/p today given ongoing issues with nausea, leukocytosis to ensure no other collections/adequate drainage Bacteremia CX show ENTEROBACTER SPECIES and KLEBSIELLA OXYTOCA  -per medicine  POD 19, s/p lap appy for gangrenous appendicitis1/1/20 Dr. Kieth Brightly - no abdominal pain on my exam - incisions c/d/i -residual appendicolith noted on CT and likely source of hepatic abscess. Patient has follow up set up with Dr. Kieth Brightly as she will likely require dx lap in the future to remove this appendicolith to fully resolve this problem. -no surgery planned this admission  FEN -renal diet as able VTE -SCDs ID -maxipime/flagyl/vanc 1/14>>1/17  Rocephin--> 1/19 Flagyl 1/17 -->  Tressie Ellis 1/19 -->   LOS: 5 days    Ileana Roup , MD [facility] 12/25/2018, 8:11 AM Pager: [phone]

## 2018-12-25 NOTE — Progress Notes (Addendum)
Family Medicine Teaching Service Daily Progress Note Intern Pager: [phone]  Patient name: [name] Medical record number: [id number] Date of birth: 02/12/1966 Age: 53 y.o. Gender: female  Primary Care Provider: Rory Percy, DO Consultants: Gen Surg, IR, Nephro  Code Status: Full   Pt Overview and Major Events to Date:  1/14 Admitted [place] 1/16CT guided drainage of liver abscess  Assessment and Plan: 53 year old female with known ESRD, type 2 diabetes, hypertension, blindness, alcohol dependency presenting with 2 days history of nausea vomiting and diarrhea. Discharged on 1/4 s/p lap appendectomy in which "feculent material poured from appendix", received IV Zosyn x 3 days + JP drain, removed prior to d/c.   Liver Abscess  Sepsis  Enterobacter positive Bacteremia: S/p CT guidedplacement of drain into hepatic abscess. Per IR doing well with surgery planned 1/29. IR recs to perform TID flushes with 3-5cc NS, outpatient f/u with IR clinic x1 week. Upon discharge will need to flush drain  QD with 5cc NS. Surgery recommendatons for ID consult for duration of abx. Leukocytosis stable, 22.5>22.7>21.1. ID recommends IV ceftaz w/ HD and oral flagyl 500mg  q8h until march 3rd.   - General surgery consulted, appreciate recs: CT abd today  - f/u CT abdomen - ID consulted for antibiotic course duration, appreciate recommendations  - IR consulted, appreciate Recs: DC per primary team, discharge with drain (instructions given to patient)  -d/c CTX and IV flagyl (1/15-1/19) -start ceftaz w/ HD and PO flagyl (1/20- 3/3) - Tylenol PRN for pain and fever - will consult wound care for further instructions on drain management at home.  May need daily assistance given blindness.   - care management consulted for supplies for drain management   Nausea/Vomiting/Diarrhea - resolved Stating her diarrhea has improved.  Is still having some nausea after eating bacon this morning. . GI panel and  C.diff negative. Probiotic given. Improved with scopalomine patch and phenergan. Repeat EKG unchanged from prior with NSR and mild QTc 467.  - Continue to monitor -Scopalamine patch +Phenerganq6 PRNfor nausea as QTc mildly prolonged on EKG to 467 - Continue Florastor probiotic - encourage fluids  - start protonix 20mg   S/P Laparoscopic appendectomy  Retained Appendicolith: stable POD 18. Healing well. Planning for diagnostic laparoscopy for retrieval of retained appendicolith in future per surgery. - General surgery consulted, appreciate recs, continue to follow  ESRD on HD Nephro consulted. Next HD planned for 1/20.Home meds: dialyvite and [place].  - Nephro following, appreciate Recs - cont home dialyvite - cont home auryxia - monitor I/O's  T2DM  Peripheral Neuropathy  On humulin 70/30 BID and Gabapentin for neuropathy. Last A1c 10/17/18 7.7. CBG range ON: 200-300. Received 12U Insulin Aspart yesterday.  - CBG AC/HS - sSSI  HTN Takes amlodipine, losartain, and lopressor 50mg  BID at home. BP 145/77 overnight - cont home BP meds   HLD Takes lipitor at home. LFT's stable.  - cont home lipitor, would hold if LFTs increase  Blindness Can only see shapes OD, - up with assistance  GERD  Takes protonix 20mg  QD at home. - cont home protonix  Hypokalemia:Resolved K of 3.9.  - continue to monitor  Tachycardia:resolved  FEN/GI:Thin renal diet with Nepro shake po BID, Protonix, Phenergen/Scopalomine PPx: SCDs  Disposition: home when cleared by surgery and antibiotic regimen is finalized  Subjective: patient is doing well today.  She has a little bit of stomach discomfort after eating some bacon this morning.  No other complaints.  She has concerns about being able  to maintain and flush her drain given her vision impairment.    Objective: Temp:  [98.4 F (36.9 C)-98.9 F (37.2 C)] 98.9 F (37.2 C) (01/19 1943) Pulse Rate:  [82-87] 87 (01/19  1943) Resp:  [18] 18 (01/19 1943) BP: (130-147)/(68-77) 145/77 (01/19 1943) SpO2:  [95 %-99 %] 95 % (01/19 1943) Weight:  [89.7 kg] 89.7 kg (01/19 2130) Physical Exam: General: awake and alert, sitting up in bed eating breakfast  Cardiovascular: RRR, no murmurs.   Respiratory: CTAB, no wheezes, rales, or rhonchi  Abdomen: soft, non tender, non distended, bowel sounds normal.  Eschar with surrounding induration but no erythema or tenderness around naval.  Draining serous fluid from drain.    Extremities: no edema, non tender   Laboratory: Recent Labs  Lab 12/23/18 0533 12/24/18 0527 12/25/18 0332  WBC 22.5* 22.7* 21.1*  HGB 8.7* 9.7* 9.2*  HCT 28.1* 31.0* 30.5*  PLT 183 335 410*   Recent Labs  Lab 12/19/18 1932 12/20/18 0630 12/21/18 0714 12/22/18 0350 12/24/18 0527 12/25/18 0332  NA 139 136 139 138 136 135  K 3.8 3.7 3.4* 3.5 3.9 3.6  CL 90* 92* 99 99 96* 97*  CO2 24 27 22  21* 21* 20*  BUN 38* 44* 34* 46* 45* 59*  CREATININE 8.23* 9.19* 7.76* 9.44* 7.84* 9.67*  CALCIUM 8.9 7.9* 8.0* 8.0* 8.5* 8.5*  PROT 8.2* 6.6 6.2*  --   --   --   BILITOT 1.3* 0.8 1.4*  --   --   --   ALKPHOS 115 95 87  --   --   --   ALT 25 37 39  --   --   --   AST 47* 78* 77*  --   --   --   GLUCOSE 199* 250* 153* 169* 236* 236*    CBG (last 3)  Recent Labs    12/24/18 1127 12/24/18 1633 12/24/18 2129  GLUCAP 274* 283* 241*     Imaging/Diagnostic Tests: Ct Abdomen Pelvis Wo Contrast  Result Date: 12/06/2018 CLINICAL DATA:  Acute onset of RIGHT LOWER QUADRANT abdominal pain that began yesterday during hemodialysis. Solitary episode of vomiting. EXAM: CT ABDOMEN AND PELVIS WITHOUT CONTRAST TECHNIQUE: Multidetector CT imaging of the abdomen and pelvis was performed following the standard protocol without IV contrast. COMPARISON:  None. FINDINGS: Lower chest: Atelectasis involving the lower lobes. Visualized lung bases otherwise clear. Heart moderately enlarged. Hepatobiliary: Normal  unenhanced appearance of the liver. Prominent Riedel's lobe. Gallbladder normal in appearance without calcified gallstones. No biliary ductal dilation. Pancreas: Normal unenhanced appearance. Spleen: Normal unenhanced appearance. Adrenals/Urinary Tract: Normal appearing adrenal glands. No evidence of urinary tract calculi. Within the limits of the unenhanced technique, no focal parenchymal abnormality involving either kidney. No evidence of hydronephrosis involving either kidney. Normal appearing urinary bladder. Stomach/Bowel: Very small hiatal hernia. Stomach decompressed and otherwise unremarkable. Normal-appearing small bowel. Mobile cecum positioned in the RIGHT UPPER QUADRANT of the abdomen. Moderate stool burden throughout the normal appearing colon. Appendix: Location: RIGHT UPPER pelvis, LATERAL retrocecal location. Diameter: 16 mm Appendicolith: Multiple. Mucosal hyper-enhancement: Moderate. Extraluminal gas: Absent. Periappendiceal collection: Absent. Marked edema/inflammation surrounding the appendix. Vascular/Lymphatic: Mild to moderate iliofemoral atherosclerosis. No evidence of aortic aneurysm. No pathologic lymphadenopathy. Reproductive: Normal-appearing uterus and ovaries without evidence of adnexal mass. Other: None. Musculoskeletal: BILATERAL L4 pars defects without evidence of spondylolisthesis. Mild degenerative disc disease at L4-5 with circumferential disc bulge. Mild multifactorial spinal stenosis at the L4-5 level. No acute findings. IMPRESSION: 1. Acute appendicitis.  No evidence of perforation or abscess. 2. Atelectasis involving the lower lobes. 3. Very small hiatal hernia. 4. BILATERAL L4 pars defects without evidence of spondylolisthesis. Mild multifactorial spinal stenosis at L4-5. I telephoned these critical/emergent results to [facility], [place], in the emergency department at the time of interpretation on 12/06/2018 at 7:12 p.m. Electronically Signed   By: Evangeline Dakin M.D.    On: 12/06/2018 19:13   Dg Chest 2 View  Result Date: 12/19/2018 CLINICAL DATA:  Nausea, vomiting, and diarrhea. Generalized body aches over the last day. History of hypertension and dialysis. EXAM: CHEST - 2 VIEW COMPARISON:  11/16/2017 FINDINGS: Mild cardiac enlargement. Linear atelectasis in the lung bases. No airspace disease or consolidation in the lungs. No blunting of costophrenic angles. No pneumothorax. Mediastinal contours appear intact. Degenerative changes in the shoulders. IMPRESSION: Mild cardiac enlargement. Linear atelectasis in the lung bases. No evidence of active pulmonary disease. Electronically Signed   By: Lucienne Capers M.D.   On: 12/19/2018 21:42   Ct Abdomen Pelvis W Contrast  Addendum Date: 12/21/2018   ADDENDUM REPORT: 12/21/2018 10:40 ADDENDUM: There is an approximately 8-9 mm radiopaque stone adjacent to the inferior tip of the liver with a surrounding fluid collection. This is contiguous with intrahepatic abscess in hepatic segment 6. This stone was not present on the prior examination when the patient was diagnosed with acute appendicitis with numerous appendicoliths. This almost certainly represents a retained appendicolith which is the source of the patient's intrahepatic abscess. These results were called by telephone at the time of interpretation on 12/21/2018 to [facility], [place] , who verbally acknowledged these results. Electronically Signed   By: Jacqulynn Cadet M.D.   On: 12/21/2018 10:40   Result Date: 12/21/2018 CLINICAL DATA:  Nausea, vomiting, and diarrhea. Generalized body aches for 1 day. Dialysis for 3 years. Appendectomy on 12/06/2018. EXAM: CT ABDOMEN AND PELVIS WITH CONTRAST TECHNIQUE: Multidetector CT imaging of the abdomen and pelvis was performed using the standard protocol following bolus administration of intravenous contrast. CONTRAST:  179mL OMNIPAQUE IOHEXOL 300 MG/ML  SOLN COMPARISON:  12/06/2018 FINDINGS: Lower chest: Linear atelectasis in the  lung bases. Hepatobiliary: Lobulated circumscribed cystic lesions demonstrated in the inferior liver, new since previous study, suggesting hepatic abscess. Largest lesion measures about 3.7 cm diameter. Gallbladder and bile ducts are unremarkable. Pancreas: Unremarkable. No pancreatic ductal dilatation or surrounding inflammatory changes. Spleen: Normal in size without focal abnormality. Adrenals/Urinary Tract: Adrenal glands are unremarkable. Kidneys are normal, without renal calculi, focal lesion, or hydronephrosis. Bladder is unremarkable. Stomach/Bowel: Stomach, small bowel, and colon are mostly decompressed, limiting evaluation of the wall. In the right lower quadrant, there is evidence of infiltration in the right lower quadrant fat with some wall thickening suggested in the cecum and ascending colon. Minimal soft tissue edema without definite loculation. This may represent postoperative change or residual inflammatory changes from previous appendicitis. Recurrent inflammatory process could also be present. Vascular/Lymphatic: No significant vascular findings are present. No enlarged abdominal or pelvic lymph nodes. There a few scattered prominent lymph nodes in the right lower quadrant, likely reactive. Reproductive: Uterus and bilateral adnexa are unremarkable. Other: No free air in the abdomen. Peritoneal nodule along the right lower quadrant anteriorly measuring 1.4 cm diameter, not present previously. This could represent postoperative changes a suture granuloma or postoperative inflammatory reaction. Musculoskeletal: No acute or significant osseous findings. IMPRESSION: 1. Multiloculated cystic lesions demonstrated in the inferior liver, new since previous study, suggesting hepatic abscess. 2. Postoperative and/or inflammatory stranding and  edema throughout the right lower quadrant fat with associated colonic wall thickening in the cecum and ascending colon. 3. Peritoneal nodule along the right lower  quadrant anteriorly may represent postoperative changes or postoperative inflammatory reaction. Electronically Signed: By: Lucienne Capers M.D. On: 12/19/2018 22:44   Ct Image Guided Drainage By Percutaneous Catheter  Result Date: 12/21/2018 INDICATION: 53 year old female with a history of gangrenous acute appendicitis status post appendectomy. Recent CT imaging demonstrates and intrahepatic abscess with a small stone adjacent to the liver. The stone was not present on prior imaging and almost certainly represents a retained appendicolith which likely represents the source of the patient's hepatic abscess. She presents for CT-guided drainage of the abscess cavity. EXAM: CT-guided drain placement MEDICATIONS: The patient is currently admitted to the hospital and receiving intravenous antibiotics. The antibiotics were administered within an appropriate time frame prior to the initiation of the procedure. Additionally, 4 mg Zofran were administered intravenously for nausea. ANESTHESIA/SEDATION: Fentanyl 1 mcg IV; Versed 50 mg IV. Moderate Sedation Time:  21 minutes The patient was continuously monitored during the procedure by the interventional radiology nurse under my direct supervision. COMPLICATIONS: None immediate. PROCEDURE: Informed written consent was obtained from the patient after a thorough discussion of the procedural risks, benefits and alternatives. All questions were addressed. Maximal Sterile Barrier Technique was utilized including caps, mask, sterile gowns, sterile gloves, sterile drape, hand hygiene and skin antiseptic. A timeout was performed prior to the initiation of the procedure. A planning CT scan was performed. The low-attenuation collection in the inferior aspect of hepatic segment 6 is easily visualized. The small appendicoliths is again visualized. A suitable skin entry site was selected and marked. After the skin was sterilely prepped and draped in the standard fashion using  chlorhexidine skin prep, local anesthesia was attained by infiltration with 1% lidocaine. A small dermatotomy was made. Under intermittent CT guidance, an 18 gauge trocar needle was advanced through the liver and into the fluid collection. A 0.035 wire was then coiled in the complex collection. The needle was removed. The skin and transhepatic tract were dilated to 10 [place] and a Cook 10.2 [place] all-purpose drainage catheter was advanced over the wire and formed. Aspiration yields approximately 20 cc of foul-smelling thick, purulent fluid. The aspirated fluid was sent for Gram stain and culture. Follow-up CT imaging demonstrates excellent position of the drainage catheter. The catheter was flushed, connected to JP bulb suction and secured to the skin with 0 Prolene suture. The patient tolerated the procedure well. IMPRESSION: Successful placement of a 10 French transhepatic drainage catheter into the inferior right hepatic abscess. Aspiration yields 20 mL of thick, foul-smelling purulent fluid. Samples were sent for culture. PLAN: 1. Maintain drain to JP bulb suction. 2. Flush drain at least once per shift. 3. Follow cultures and adjust antibiotics accordingly. 4. Surgery aware of the presence of the retained appendicoliths. This will represent a continued nidus for infection/abscess formation until removed. Electronically Signed   By: Jacqulynn Cadet M.D.   On: 12/21/2018 10:15     Benay Pike, MD 12/25/2018, 6:28 AM PGY-2, [place] Intern pager: [phone], text pages welcome

## 2018-12-25 NOTE — Progress Notes (Signed)
Inpatient Diabetes Program Recommendations  AACE/ADA: New Consensus Statement on Inpatient Glycemic Control  Target Ranges:  Prepandial:   less than 140 mg/dL      Peak postprandial:   less than 180 mg/dL (1-2 hours)      Critically ill patients:  140 - 180 mg/dL   Results for [name], [name] (MRN [id number]) as of 12/25/2018 09:47  Ref. Range 12/24/2018 07:19 12/24/2018 11:27 12/24/2018 16:33 12/24/2018 21:29 12/25/2018 07:39  Glucose-Capillary Latest Ref Range: 70 - 99 mg/dL 205 (H) 274 (H) 283 (H) 241 (H) 242 (H)   Review of Glycemic Control   Outpatient Diabetes medications: Humulin 70/30 5-10 units BID Current orders for Inpatient glycemic control: Novolog 0-9 units TID with meals  Inpatient Diabetes Program Recommendations:  Insulin - Basal: Please consider ordering Levemir 9 units Q24H (based on 89.7 kg x 0.1 units). Insulin-Correction: Please consider ordering Novolog 0-5 units QHS for bedtime correction.  Thanks, Barnie Alderman, RN, MSN, CDE Diabetes Coordinator Inpatient Diabetes Program [phone] (Team Pager from 8am to 5pm)

## 2018-12-26 ENCOUNTER — Other Ambulatory Visit: Payer: Self-pay | Admitting: Internal Medicine

## 2018-12-26 LAB — CBC WITH DIFFERENTIAL/PLATELET
Abs Immature Granulocytes: 0.2 10*3/uL — ABNORMAL HIGH (ref 0.00–0.07)
Basophils Absolute: 0.9 10*3/uL — ABNORMAL HIGH (ref 0.0–0.1)
Basophils Relative: 4 %
Eosinophils Absolute: 0.7 10*3/uL — ABNORMAL HIGH (ref 0.0–0.5)
Eosinophils Relative: 3 %
HCT: 29.5 % — ABNORMAL LOW (ref 36.0–46.0)
Hemoglobin: 9 g/dL — ABNORMAL LOW (ref 12.0–15.0)
Lymphocytes Relative: 9 %
Lymphs Abs: 2 10*3/uL (ref 0.7–4.0)
MCH: 22.4 pg — ABNORMAL LOW (ref 26.0–34.0)
MCHC: 30.5 g/dL (ref 30.0–36.0)
MCV: 73.6 fL — ABNORMAL LOW (ref 80.0–100.0)
Monocytes Absolute: 1.1 10*3/uL — ABNORMAL HIGH (ref 0.1–1.0)
Monocytes Relative: 5 %
Myelocytes: 1 %
Neutro Abs: 17.3 10*3/uL — ABNORMAL HIGH (ref 1.7–7.7)
Neutrophils Relative %: 78 %
Platelets: 354 10*3/uL (ref 150–400)
RBC: 4.01 MIL/uL (ref 3.87–5.11)
RDW: 18.3 % — ABNORMAL HIGH (ref 11.5–15.5)
WBC: 22.2 10*3/uL — ABNORMAL HIGH (ref 4.0–10.5)
nRBC: 0.1 % (ref 0.0–0.2)
nRBC: 1 /100 WBC — ABNORMAL HIGH

## 2018-12-26 LAB — BASIC METABOLIC PANEL
Anion gap: 15 (ref 5–15)
BUN: 24 mg/dL — ABNORMAL HIGH (ref 6–20)
CO2: 25 mmol/L (ref 22–32)
Calcium: 8.2 mg/dL — ABNORMAL LOW (ref 8.9–10.3)
Chloride: 96 mmol/L — ABNORMAL LOW (ref 98–111)
Creatinine, Ser: 5.82 mg/dL — ABNORMAL HIGH (ref 0.44–1.00)
GFR calc Af Amer: 9 mL/min — ABNORMAL LOW (ref >60)
GFR calc non Af Amer: 8 mL/min — ABNORMAL LOW (ref >60)
Glucose, Bld: 132 mg/dL — ABNORMAL HIGH (ref 70–99)
Potassium: 3.6 mmol/L (ref 3.5–5.1)
Sodium: 136 mmol/L (ref 135–145)

## 2018-12-26 LAB — GLUCOSE, CAPILLARY
Glucose-Capillary: 127 mg/dL — ABNORMAL HIGH (ref 70–99)
Glucose-Capillary: 183 mg/dL — ABNORMAL HIGH (ref 70–99)

## 2018-12-26 MED ORDER — INSULIN DETEMIR 100 UNIT/ML ~~LOC~~ SOLN: 10.0000 [IU] | Freq: Every day | SUBCUTANEOUS | 11 refills | Status: DC

## 2018-12-26 MED ORDER — CHLORHEXIDINE GLUCONATE CLOTH 2 % EX PADS: 6.0000 | MEDICATED_PAD | Freq: Every day | CUTANEOUS | Status: DC

## 2018-12-26 MED ORDER — CEFTAZIDIME IV (FOR PTA / DISCHARGE USE ONLY): 2.0000 g | INTRAVENOUS | 0 refills | Status: AC

## 2018-12-26 MED ORDER — PROMETHAZINE HCL 12.5 MG PO TABS: 12.5000 mg | ORAL_TABLET | Freq: Four times a day (QID) | ORAL | 0 refills | Status: DC | PRN

## 2018-12-26 MED ORDER — METRONIDAZOLE 500 MG PO TABS: 500.0000 mg | ORAL_TABLET | Freq: Three times a day (TID) | ORAL | 1 refills | Status: DC

## 2018-12-26 MED ORDER — PANTOPRAZOLE SODIUM 40 MG PO TBEC: 40.0000 mg | DELAYED_RELEASE_TABLET | Freq: Every day | ORAL | 0 refills | Status: DC

## 2018-12-26 NOTE — Discharge Instructions (Signed)
You will need to continue taking antibiotics until march 3rd.  You would take flagyl every day and the fortaz will be given with your dialysis.    We changed the insulin you take.  You no longer need to take the novolin mix 70/30 twice a day.  Instead you will take Levemir in the morning.  Please continue to check your blood sugars first thing in the morning and with meals.    We have prescribed some phenergan for nausea.  Take these as needed no more than every 6 hours.    You have follow up appointments scheduled with family medicine, infectious disease, and surgery.  The dates are listed elsewhere.  Interventional radiology will call you to schedule a followup appointment.

## 2018-12-26 NOTE — Progress Notes (Addendum)
[facility] Progress Note  Dialysis Orders: MWF -[facility] 3.75hrs, [id number], DFRAF 1.5, EDW 90.5kg,2K/2Ca  Access:LU [id number] units Mircera150mcg q2wks - last 11/26/18 Calcitriol34mcg PO qHD  Assessment/Plan: 1. Liver Abscess/eterobacter bacteremia/sepsis; s/p lap appy for gangrenous appendicitis 12/06/18; residual appendicolith noted on CT likely source of hepatic abscess. Per surgery will likely require dx lap in the future to remove this - no surgery planned this admission.- Afebrile WBC trending down then stabilized low 20K.  Changed to fortaz and continues on flagyl . S/p IR drained abscess.  CT 1/20 showed adequate drainage per surgery- Tressie Ellis can be given at dialysis 2 gm IV q HD - please specify duration of therapy 2. N/v/d - likely related to #1. Ongoing. Meds per primary.Overall better.  3. ESRD - On HD MWF.K 3.6 - on added K bath while here - may need 3 K for d/c- HD Wed if not d/c today 4. Anemia of CKD - Hgb 8.2> 9.7>9.2>9  .Aranesp 100 given 1/17 5. Secondary Hyperparathyroidism - CCa in goal.Phos elevated. Continues VDRA- hold binders for a couple days and resume on Tuesday at 1 ac- previously held due to  due to N, V and diarrhea and poor intake. Has been on 2 Aurixya ac 6.   Nutrition - On regular diet to improve intake plus vits/supplements- still marginal at best  7.DMT2 - per primary 8. HTN/volume - BP/volume status ok  Net UF 993 ml Friday post wt 88.3 and 1.6 Monday with post wt  88 - lower to 88 at d/c  Myriam Jacobson, PA-C [place] [phone] 12/25/2018,10:30 AM  LOS: 12 days   Pt seen, examined and agree w A/P as above.  Kelly Splinter MD [facility] pager [phone]   12/26/2018, 2:43 PM    Subjective: Objective Vitals:   12/25/18 1600 12/25/18 1611 12/25/18 1712 12/26/18 0500  BP: 117/67 113/69 123/77 (!) 143/72  Pulse: 89 90 94 91  Resp:  18 18 20   Temp:  98.6 F (37 C)  98.9 F (37.2 C) 99.3 F (37.4 C)  TempSrc:  Oral Oral Oral  SpO2:  100% 100% 98%  Weight:  88 kg  88.4 kg  Height:       Physical Exam General: Blind NAD  Heart: RRR Lungs: no rales Abdomen: soft NT - JP drain intact Extremities: no LLE edema /right BKA no edema Dialysis Access:  Left upper AVF + bruit   Additional Objective Labs: Basic Metabolic Panel: Recent Labs  Lab 12/20/18 1330  12/24/18 0527 12/25/18 0332 12/26/18 0637  NA  --    < > 136 135 136  K  --    < > 3.9 3.6 3.6  CL  --    < > 96* 97* 96*  CO2  --    < > 21* 20* 25  GLUCOSE  --    < > 236* 236* 132*  BUN  --    < > 45* 59* 24*  CREATININE  --    < > 7.84* 9.67* 5.82*  CALCIUM  --    < > 8.5* 8.5* 8.2*  PHOS 6.7*  --   --   --   --    < > = values in this interval not displayed.   Liver Function Tests: Recent Labs  Lab 12/19/18 1932 12/20/18 0630 12/21/18 0714  AST 47* 78* 77*  ALT 25 37 39  ALKPHOS 115 95 87  BILITOT 1.3* 0.8 1.4*  PROT 8.2* 6.6 6.2*  ALBUMIN 2.8* 2.2* 2.0*   Recent Labs  Lab 12/19/18 1932  LIPASE 60*   CBC: Recent Labs  Lab 12/22/18 0350 12/23/18 0533 12/24/18 0527 12/25/18 0332 12/26/18 0637  WBC 21.2* 22.5* 22.7* 21.1* 22.2*  NEUTROABS  --   --   --   --  17.3*  HGB 8.2* 8.7* 9.7* 9.2* 9.0*  HCT 27.2* 28.1* 31.0* 30.5* 29.5*  MCV 74.5* 75.1* 73.6* 73.3* 73.6*  PLT 360 183 335 410* 354   Blood Culture    Component Value Date/Time   SDES BLOOD RIGHT HAND 12/24/2018 1308   SPECREQUEST  12/24/2018 1308    BOTTLES DRAWN AEROBIC AND ANAEROBIC Blood Culture adequate volume   CULT  12/24/2018 1308    NO GROWTH 1 DAY Performed at [facility], [place] [address] [place],  [address]    REPTSTATUS PENDING 12/24/2018 1308    Cardiac Enzymes: No results for input(s): CKTOTAL, CKMB, CKMBINDEX, TROPONINI in the last 168 hours. CBG: Recent Labs  Lab 12/24/18 2129 12/25/18 0739 12/25/18 1713 12/25/18 2105 12/26/18 0722  GLUCAP 241* 242* 110*  117* 127*   Iron Studies: No results for input(s): IRON, TIBC, TRANSFERRIN, FERRITIN in the last 72 hours. Lab Results  Component Value Date   INR 1.23 12/20/2018   INR 1.10 08/22/2011   Studies/Results: Ct Abdomen Pelvis W Contrast  Result Date: 12/25/2018 CLINICAL DATA:  Follow-up liver abscess drainage. EXAM: CT ABDOMEN AND PELVIS WITH CONTRAST TECHNIQUE: Multidetector CT imaging of the abdomen and pelvis was performed using the standard protocol following bolus administration of intravenous contrast. CONTRAST:  135mL OMNIPAQUE IOHEXOL 300 MG/ML  SOLN COMPARISON:  12/19/2018 FINDINGS: Lower chest: Small right pleural effusion without evident loculation, new. There is bilateral lower lobe atelectasis. Circumferentially thickened appearance of the esophagus which could reflect esophagitis. Hepatobiliary: Percutaneous drainage catheter into a right lower lobe liver abscess which is both fluid density and gas, still 4.3 cm in diameter. It is unclear how much of this is liquified as the abscess has an irregular rim. Small volume fluid right in for hepatic (less than a cm) with a 8 mm calcification. Multiple appendicoliths were present on the preoperative CT. Pericholecystic edema which is likely reactive as there is no gallbladder wall distension. Probable gallstone. Pancreas: Unremarkable. Spleen: Unremarkable. Adrenals/Urinary Tract: Negative adrenals. No hydronephrosis or stone. Unremarkable bladder. Stomach/Bowel: Generalized prominent colonic wall thickness only where collapsed, likely physiologic. No pericolonic inflammation except except where there is fat edema contiguous with the liver. Appendectomy. Vascular/Lymphatic: No acute vascular abnormality. Arterial calcification. No mass or adenopathy. Reproductive:Negative Other: Gas and fluid within the abdominal wall at the level of the umbilicus, measuring 3.5 cm. Musculoskeletal: No acute abnormalities. L4 chronic bilateral pars defects. Lower  lumbar disc and facet degeneration. IMPRESSION: 1. Liver abscess with percutaneous drainage catheter in good position. The area of intrahepatic phlegmon/abscess measures up to 4.3 cm. The abscess is poorly marginated and it is unclear how much is liquified. There is persistent right subhepatic small volume fluid collection with high-density area that likely reflects dropped appendicolith. 2. 3.5 cm collection at the umbilicus, possible port site abscess. 3. Lower lobe atelectasis and new small right pleural effusion. Electronically Signed   By: Monte Fantasia M.D.   On: 12/25/2018 12:00   Medications: . sodium chloride 10 mL/hr at 12/21/18 1035  . cefTAZidime (FORTAZ)  IV 2 g (12/25/18 1508)   . amLODipine  10 mg Oral Daily  . atorvastatin  40 mg Oral Daily  .  calcitRIOL  1 mcg Oral Daily  . Chlorhexidine Gluconate Cloth  6 each Topical Q0600  . darbepoetin (ARANESP) injection - DIALYSIS  100 mcg Intravenous Q Fri-HD  . feeding supplement (NEPRO CARB STEADY)  237 mL Oral BID BM  . feeding supplement (PRO-STAT SUGAR FREE 64)  30 mL Oral BID  . ferric citrate  210 mg Oral TID WC  . insulin aspart  0-9 Units Subcutaneous TID WC  . insulin detemir  9 Units Subcutaneous Daily  . losartan  50 mg Oral Daily  . metroNIDAZOLE  500 mg Oral Q8H  . multivitamin  1 tablet Oral QHS  . pantoprazole  40 mg Oral Daily  . scopolamine  1 patch Transdermal Q72H  . sodium chloride flush  5 mL Intracatheter Q8H

## 2018-12-26 NOTE — Care Management Note (Signed)
Case Management Note Manya Silvas, RN MSN CCM Transitions of Care [address] [place] [id number]  Patient Details  Name: GEORGEAN SPAINHOWER MRN: [id number] Date of Birth: [birth year]  Subjective/Objective:      Liver abcess              Action/Plan: PTA home with boyfriend Ronalee Belts) who assists patient with medications. Pt received aide services 6 days a week from [facility]. Pt would like her RN services to be with [facility]. Pt states that Ronalee Belts [phone]) will provide transportation home today. Prior to transition home bedside RN will teach Ronalee Belts how to care for drain. Will continue to follow for transition of care needs.   Expected Discharge Date:                  Expected Discharge Plan:  [place]  In-House Referral:  NA  Discharge planning Services  CM Consult  Post Acute Care Choice:  NA Choice offered to:  Patient  DME Arranged:  N/A DME Agency:  NA  HH Arranged:  RN [place] Agency:  Other - See comment([facility])  Status of Service:  In process, will continue to follow  If discussed at Long Length of Stay Meetings, dates discussed:    Additional Comments:  Bartholomew Crews, RN 12/26/2018, 8:23 AM

## 2018-12-26 NOTE — Consult Note (Signed)
[facility] CM Inpatient Consult   12/26/2018  [name] 12/27/1965 [id number]  Patient was assessed for [facility] for community services. Patient was previously active with [facility].  Met with patient and [name] at bedside regarding being restarted with [facility] services. Patient states she is [facility].  Transportation is with [facility] on [address] to HD as well as [facility] to MD appointments. She states she gets her medications from [facility] [currently has a new name].  She states her primary care provider is Alison Rumball, DO at [facility]. She states no needs but will accept EMMI calls for follow up.  Of note, [facility] services does not replace or interfere with any services that are arranged by inpatient case management or social work. For additional questions or referrals please contact:  Victoria Brewer, RN BSN CCM [facility] Liaison  [phone] business mobile phone Toll free office [phone]      

## 2018-12-26 NOTE — Progress Notes (Signed)
Family Medicine Teaching Service Daily Progress Note Intern Pager: [phone]  Patient name: [name] Medical record number: [id number] Date of birth: 09-22-1966 Age: 53 y.o. Gender: female  Primary Care Provider: Rory Percy, DO Consultants: Gen Surg, IR, Nephro  Code Status: Full   Pt Overview and Major Events to Date:  1/14 Admitted [place] 1/16CT guided drainage of liver abscess  Assessment and Plan: 53 year old female with known ESRD, type 2 diabetes, hypertension, blindness, alcohol dependency presenting with 2 days history of nausea vomiting and diarrhea. Discharged on 1/4 s/p lap appendectomy in which "feculent material poured from appendix", received IV Zosyn x 3 days + JP drain, removed prior to d/c.   Liver Abscess  Sepsis  Enterobacter positive Bacteremia: S/p CT guidedplacement of drain into hepatic abscess. Per IR doing well with surgery planned 1/29. IR recs to perform TID flushes with 3-5cc NS, outpatient f/u with IR clinic x1 week. Upon discharge will need to flush drain  QD with 5cc NS. Leukocytosis stable, 22.5>22.7>21.1>22.2. ID recommends IV ceftaz w/ HD and oral flagyl 500mg  q8h until march 3rd.  CT abdomen read, surgery signing off.  - General surgery consulted, appreciate recs:  - ID consulted for antibiotic course duration, appreciate recommendations  - IR consulted, appreciate Recs: DC per primary team, discharge with drain (instructions given to patient)  -ceftaz w/ HD and PO flagyl (1/20- 3/3) - Tylenol PRN for pain and fever - will consult wound care for further instructions on drain management at home.  May need daily assistance given blindness.   -patient's boyfriend to receive care instructions for drain before she can be discharged.   Nausea/Vomiting/Diarrhea - resolved No complaints of nausea this morning.. GI panel and C.diff negative. Probiotic given. Improved with scopalomine patch and phenergan. Repeat EKG unchanged from prior with NSR  and mild QTc 467.  - Continue to monitor -Scopalamine patch +Phenerganq6 PRNfor nausea as QTc mildly prolonged on EKG to 467 - Continue Florastor probiotic - encourage fluids  - start protonix 20mg   S/P Laparoscopic appendectomy  Retained Appendicolith: stable POD 18. Healing well. Planning for diagnostic laparoscopy for retrieval of retained appendicolith in future per surgery. - General surgery consulted, appreciate recs, continue to follow  ESRD on HD Nephro consulted. Next HD planned for 1/20.Home meds: dialyvite and [place].  - Nephro following, appreciate Recs - cont home dialyvite - cont home auryxia - monitor I/O's  T2DM  Peripheral Neuropathy  On humulin 70/30 BID and Gabapentin for neuropathy. Last A1c 10/17/18 7.7. CBG range ON: 110-117. Started patient on levemir 9 U.  - CBG AC/HS - levemir 9 U - sSSI  HTN Takes amlodipine, losartain, and lopressor 50mg  BID at home. BP 143/72 overnight - cont home BP meds   HLD Takes lipitor at home. LFT's stable.  - cont home lipitor, would hold if LFTs increase  Blindness Can only see shapes OD, - up with assistance  GERD  Takes protonix 20mg  QD at home. - cont home protonix   FEN/GI:Thin renal diet with Nepro shake po BID, Protonix, Phenergen/Scopalomine PPx: SCDs  Disposition: home   Subjective: patient complaining of heartburn today, but otherwise has no complaints.   Objective: Temp:  [98.3 F (36.8 C)-99.3 F (37.4 C)] 99.3 F (37.4 C) (01/21 0500) Pulse Rate:  [79-94] 91 (01/21 0500) Resp:  [18-20] 20 (01/21 0500) BP: (113-158)/(61-84) 143/72 (01/21 0500) SpO2:  [98 %-100 %] 98 % (01/21 0500) Weight:  [88 kg-89.7 kg] 88.4 kg (01/21 0500) Physical Exam: General: awake and  alert, sitting up in bed eating breakfast  Cardiovascular: RRR, no murmurs.   Respiratory: CTAB, no wheezes, rales, or rhonchi  Abdomen: soft, non tender, non distended, bowel sounds normal.  Eschar with surrounding  induration but no erythema or tenderness around naval.  Draining serous fluid from drain.    Extremities: no edema, non tender   Laboratory: Recent Labs  Lab 12/23/18 0533 12/24/18 0527 12/25/18 0332  WBC 22.5* 22.7* 21.1*  HGB 8.7* 9.7* 9.2*  HCT 28.1* 31.0* 30.5*  PLT 183 335 410*   Recent Labs  Lab 12/19/18 1932 12/20/18 0630 12/21/18 0714 12/22/18 0350 12/24/18 0527 12/25/18 0332  NA 139 136 139 138 136 135  K 3.8 3.7 3.4* 3.5 3.9 3.6  CL 90* 92* 99 99 96* 97*  CO2 24 27 22  21* 21* 20*  BUN 38* 44* 34* 46* 45* 59*  CREATININE 8.23* 9.19* 7.76* 9.44* 7.84* 9.67*  CALCIUM 8.9 7.9* 8.0* 8.0* 8.5* 8.5*  PROT 8.2* 6.6 6.2*  --   --   --   BILITOT 1.3* 0.8 1.4*  --   --   --   ALKPHOS 115 95 87  --   --   --   ALT 25 37 39  --   --   --   AST 47* 78* 77*  --   --   --   GLUCOSE 199* 250* 153* 169* 236* 236*    CBG (last 3)  Recent Labs    12/25/18 0739 12/25/18 1713 12/25/18 2105  GLUCAP 242* 110* 117*     Imaging/Diagnostic Tests: Ct Abdomen Pelvis Wo Contrast  Result Date: 12/06/2018 CLINICAL DATA:  Acute onset of RIGHT LOWER QUADRANT abdominal pain that began yesterday during hemodialysis. Solitary episode of vomiting. EXAM: CT ABDOMEN AND PELVIS WITHOUT CONTRAST TECHNIQUE: Multidetector CT imaging of the abdomen and pelvis was performed following the standard protocol without IV contrast. COMPARISON:  None. FINDINGS: Lower chest: Atelectasis involving the lower lobes. Visualized lung bases otherwise clear. Heart moderately enlarged. Hepatobiliary: Normal unenhanced appearance of the liver. Prominent Riedel's lobe. Gallbladder normal in appearance without calcified gallstones. No biliary ductal dilation. Pancreas: Normal unenhanced appearance. Spleen: Normal unenhanced appearance. Adrenals/Urinary Tract: Normal appearing adrenal glands. No evidence of urinary tract calculi. Within the limits of the unenhanced technique, no focal parenchymal abnormality involving  either kidney. No evidence of hydronephrosis involving either kidney. Normal appearing urinary bladder. Stomach/Bowel: Very small hiatal hernia. Stomach decompressed and otherwise unremarkable. Normal-appearing small bowel. Mobile cecum positioned in the RIGHT UPPER QUADRANT of the abdomen. Moderate stool burden throughout the normal appearing colon. Appendix: Location: RIGHT UPPER pelvis, LATERAL retrocecal location. Diameter: 16 mm Appendicolith: Multiple. Mucosal hyper-enhancement: Moderate. Extraluminal gas: Absent. Periappendiceal collection: Absent. Marked edema/inflammation surrounding the appendix. Vascular/Lymphatic: Mild to moderate iliofemoral atherosclerosis. No evidence of aortic aneurysm. No pathologic lymphadenopathy. Reproductive: Normal-appearing uterus and ovaries without evidence of adnexal mass. Other: None. Musculoskeletal: BILATERAL L4 pars defects without evidence of spondylolisthesis. Mild degenerative disc disease at L4-5 with circumferential disc bulge. Mild multifactorial spinal stenosis at the L4-5 level. No acute findings. IMPRESSION: 1. Acute appendicitis. No evidence of perforation or abscess. 2. Atelectasis involving the lower lobes. 3. Very small hiatal hernia. 4. BILATERAL L4 pars defects without evidence of spondylolisthesis. Mild multifactorial spinal stenosis at L4-5. I telephoned these critical/emergent results to [facility], [place], in the emergency department at the time of interpretation on 12/06/2018 at 7:12 p.m. Electronically Signed   By: Evangeline Dakin M.D.   On: 12/06/2018 19:13  Dg Chest 2 View  Result Date: 12/19/2018 CLINICAL DATA:  Nausea, vomiting, and diarrhea. Generalized body aches over the last day. History of hypertension and dialysis. EXAM: CHEST - 2 VIEW COMPARISON:  11/16/2017 FINDINGS: Mild cardiac enlargement. Linear atelectasis in the lung bases. No airspace disease or consolidation in the lungs. No blunting of costophrenic angles. No  pneumothorax. Mediastinal contours appear intact. Degenerative changes in the shoulders. IMPRESSION: Mild cardiac enlargement. Linear atelectasis in the lung bases. No evidence of active pulmonary disease. Electronically Signed   By: Lucienne Capers M.D.   On: 12/19/2018 21:42   Ct Abdomen Pelvis W Contrast  Result Date: 12/25/2018 CLINICAL DATA:  Follow-up liver abscess drainage. EXAM: CT ABDOMEN AND PELVIS WITH CONTRAST TECHNIQUE: Multidetector CT imaging of the abdomen and pelvis was performed using the standard protocol following bolus administration of intravenous contrast. CONTRAST:  141mL OMNIPAQUE IOHEXOL 300 MG/ML  SOLN COMPARISON:  12/19/2018 FINDINGS: Lower chest: Small right pleural effusion without evident loculation, new. There is bilateral lower lobe atelectasis. Circumferentially thickened appearance of the esophagus which could reflect esophagitis. Hepatobiliary: Percutaneous drainage catheter into a right lower lobe liver abscess which is both fluid density and gas, still 4.3 cm in diameter. It is unclear how much of this is liquified as the abscess has an irregular rim. Small volume fluid right in for hepatic (less than a cm) with a 8 mm calcification. Multiple appendicoliths were present on the preoperative CT. Pericholecystic edema which is likely reactive as there is no gallbladder wall distension. Probable gallstone. Pancreas: Unremarkable. Spleen: Unremarkable. Adrenals/Urinary Tract: Negative adrenals. No hydronephrosis or stone. Unremarkable bladder. Stomach/Bowel: Generalized prominent colonic wall thickness only where collapsed, likely physiologic. No pericolonic inflammation except except where there is fat edema contiguous with the liver. Appendectomy. Vascular/Lymphatic: No acute vascular abnormality. Arterial calcification. No mass or adenopathy. Reproductive:Negative Other: Gas and fluid within the abdominal wall at the level of the umbilicus, measuring 3.5 cm. Musculoskeletal:  No acute abnormalities. L4 chronic bilateral pars defects. Lower lumbar disc and facet degeneration. IMPRESSION: 1. Liver abscess with percutaneous drainage catheter in good position. The area of intrahepatic phlegmon/abscess measures up to 4.3 cm. The abscess is poorly marginated and it is unclear how much is liquified. There is persistent right subhepatic small volume fluid collection with high-density area that likely reflects dropped appendicolith. 2. 3.5 cm collection at the umbilicus, possible port site abscess. 3. Lower lobe atelectasis and new small right pleural effusion. Electronically Signed   By: Monte Fantasia M.D.   On: 12/25/2018 12:00   Ct Abdomen Pelvis W Contrast  Addendum Date: 12/21/2018   ADDENDUM REPORT: 12/21/2018 10:40 ADDENDUM: There is an approximately 8-9 mm radiopaque stone adjacent to the inferior tip of the liver with a surrounding fluid collection. This is contiguous with intrahepatic abscess in hepatic segment 6. This stone was not present on the prior examination when the patient was diagnosed with acute appendicitis with numerous appendicoliths. This almost certainly represents a retained appendicolith which is the source of the patient's intrahepatic abscess. These results were called by telephone at the time of interpretation on 12/21/2018 to [facility], [place] , who verbally acknowledged these results. Electronically Signed   By: Jacqulynn Cadet M.D.   On: 12/21/2018 10:40   Result Date: 12/21/2018 CLINICAL DATA:  Nausea, vomiting, and diarrhea. Generalized body aches for 1 day. Dialysis for 3 years. Appendectomy on 12/06/2018. EXAM: CT ABDOMEN AND PELVIS WITH CONTRAST TECHNIQUE: Multidetector CT imaging of the abdomen and pelvis was performed using  the standard protocol following bolus administration of intravenous contrast. CONTRAST:  147mL OMNIPAQUE IOHEXOL 300 MG/ML  SOLN COMPARISON:  12/06/2018 FINDINGS: Lower chest: Linear atelectasis in the lung bases.  Hepatobiliary: Lobulated circumscribed cystic lesions demonstrated in the inferior liver, new since previous study, suggesting hepatic abscess. Largest lesion measures about 3.7 cm diameter. Gallbladder and bile ducts are unremarkable. Pancreas: Unremarkable. No pancreatic ductal dilatation or surrounding inflammatory changes. Spleen: Normal in size without focal abnormality. Adrenals/Urinary Tract: Adrenal glands are unremarkable. Kidneys are normal, without renal calculi, focal lesion, or hydronephrosis. Bladder is unremarkable. Stomach/Bowel: Stomach, small bowel, and colon are mostly decompressed, limiting evaluation of the wall. In the right lower quadrant, there is evidence of infiltration in the right lower quadrant fat with some wall thickening suggested in the cecum and ascending colon. Minimal soft tissue edema without definite loculation. This may represent postoperative change or residual inflammatory changes from previous appendicitis. Recurrent inflammatory process could also be present. Vascular/Lymphatic: No significant vascular findings are present. No enlarged abdominal or pelvic lymph nodes. There a few scattered prominent lymph nodes in the right lower quadrant, likely reactive. Reproductive: Uterus and bilateral adnexa are unremarkable. Other: No free air in the abdomen. Peritoneal nodule along the right lower quadrant anteriorly measuring 1.4 cm diameter, not present previously. This could represent postoperative changes a suture granuloma or postoperative inflammatory reaction. Musculoskeletal: No acute or significant osseous findings. IMPRESSION: 1. Multiloculated cystic lesions demonstrated in the inferior liver, new since previous study, suggesting hepatic abscess. 2. Postoperative and/or inflammatory stranding and edema throughout the right lower quadrant fat with associated colonic wall thickening in the cecum and ascending colon. 3. Peritoneal nodule along the right lower quadrant  anteriorly may represent postoperative changes or postoperative inflammatory reaction. Electronically Signed: By: Lucienne Capers M.D. On: 12/19/2018 22:44   Ct Image Guided Drainage By Percutaneous Catheter  Result Date: 12/21/2018 INDICATION: 53 year old female with a history of gangrenous acute appendicitis status post appendectomy. Recent CT imaging demonstrates and intrahepatic abscess with a small stone adjacent to the liver. The stone was not present on prior imaging and almost certainly represents a retained appendicolith which likely represents the source of the patient's hepatic abscess. She presents for CT-guided drainage of the abscess cavity. EXAM: CT-guided drain placement MEDICATIONS: The patient is currently admitted to the hospital and receiving intravenous antibiotics. The antibiotics were administered within an appropriate time frame prior to the initiation of the procedure. Additionally, 4 mg Zofran were administered intravenously for nausea. ANESTHESIA/SEDATION: Fentanyl 1 mcg IV; Versed 50 mg IV. Moderate Sedation Time:  21 minutes The patient was continuously monitored during the procedure by the interventional radiology nurse under my direct supervision. COMPLICATIONS: None immediate. PROCEDURE: Informed written consent was obtained from the patient after a thorough discussion of the procedural risks, benefits and alternatives. All questions were addressed. Maximal Sterile Barrier Technique was utilized including caps, mask, sterile gowns, sterile gloves, sterile drape, hand hygiene and skin antiseptic. A timeout was performed prior to the initiation of the procedure. A planning CT scan was performed. The low-attenuation collection in the inferior aspect of hepatic segment 6 is easily visualized. The small appendicoliths is again visualized. A suitable skin entry site was selected and marked. After the skin was sterilely prepped and draped in the standard fashion using chlorhexidine skin  prep, local anesthesia was attained by infiltration with 1% lidocaine. A small dermatotomy was made. Under intermittent CT guidance, an 18 gauge trocar needle was advanced through the liver and into the fluid  collection. A 0.035 wire was then coiled in the complex collection. The needle was removed. The skin and transhepatic tract were dilated to 10 [place] and a Cook 10.2 [place] all-purpose drainage catheter was advanced over the wire and formed. Aspiration yields approximately 20 cc of foul-smelling thick, purulent fluid. The aspirated fluid was sent for Gram stain and culture. Follow-up CT imaging demonstrates excellent position of the drainage catheter. The catheter was flushed, connected to JP bulb suction and secured to the skin with 0 Prolene suture. The patient tolerated the procedure well. IMPRESSION: Successful placement of a 10 French transhepatic drainage catheter into the inferior right hepatic abscess. Aspiration yields 20 mL of thick, foul-smelling purulent fluid. Samples were sent for culture. PLAN: 1. Maintain drain to JP bulb suction. 2. Flush drain at least once per shift. 3. Follow cultures and adjust antibiotics accordingly. 4. Surgery aware of the presence of the retained appendicoliths. This will represent a continued nidus for infection/abscess formation until removed. Electronically Signed   By: Jacqulynn Cadet M.D.   On: 12/21/2018 10:15     Benay Pike, MD 12/26/2018, 6:06 AM PGY-2, [facility] Intern pager: [phone], text pages welcome

## 2018-12-26 NOTE — Progress Notes (Signed)
Pt boyfriend at bedside and was taught by this RN how to flush and care for the drain, pt boyfriend verbalized understanding and demonstrate it via teach back.

## 2018-12-26 NOTE — Progress Notes (Signed)
Patient ID: [name], female   DOB: 12-24-1965, 53 y.o.   MRN: [id number]       Subjective: No vomiting today.  States nausea is a little better.  Waiting on reflux medicine prior to eating some breakfast.  Objective: Vital signs in last 24 hours: Temp:  [98.3 F (36.8 C)-99.3 F (37.4 C)] 99.3 F (37.4 C) (01/21 0500) Pulse Rate:  [79-94] 91 (01/21 0500) Resp:  [18-20] 20 (01/21 0500) BP: (113-158)/(61-84) 143/72 (01/21 0500) SpO2:  [98 %-100 %] 98 % (01/21 0500) Weight:  [88 kg-89.7 kg] 88.4 kg (01/21 0500) Last BM Date: 12/24/18  Intake/Output from previous day: 01/20 0701 - 01/21 0700 In: 260 [P.O.:240] Out: 1630 [Drains:30] Intake/Output this shift: No intake/output data recorded.  PE: Abd: soft, NT, all incisions are c/d/i, specifically periumbilical incision shows no fluctuance, erythema, or tenderness associated with abscess.  Lab Results:  Recent Labs    12/25/18 0332 12/26/18 0637  WBC 21.1* 22.2*  HGB 9.2* 9.0*  HCT 30.5* 29.5*  PLT 410* 354   BMET Recent Labs    12/25/18 0332 12/26/18 0637  NA 135 136  K 3.6 3.6  CL 97* 96*  CO2 20* 25  GLUCOSE 236* 132*  BUN 59* 24*  CREATININE 9.67* 5.82*  CALCIUM 8.5* 8.2*   PT/INR No results for input(s): LABPROT, INR in the last 72 hours. CMP     Component Value Date/Time   NA 136 12/26/2018 0637   NA 139 09/12/2013 0736   K 3.6 12/26/2018 0637   K 5.2 (H) 11/07/2013 0802   CL 96 (L) 12/26/2018 0637   CL 111 (H) 09/12/2013 0736   CO2 25 12/26/2018 0637   CO2 21 09/12/2013 0736   GLUCOSE 132 (H) 12/26/2018 0637   GLUCOSE 181 (H) 09/12/2013 0736   BUN 24 (H) 12/26/2018 0637   BUN 35 (H) 09/12/2013 0736   CREATININE 5.82 (H) 12/26/2018 0637   CREATININE 1.55 (H) 09/12/2013 0736   CREATININE 0.94 09/12/2012 1113   CALCIUM 8.2 (L) 12/26/2018 0637   CALCIUM 7.6 (L) 03/31/2016 1101   PROT 6.2 (L) 12/21/2018 0714   ALBUMIN 2.0 (L) 12/21/2018 0714   AST 77 (H) 12/21/2018 0714   ALT 39  12/21/2018 0714   ALKPHOS 87 12/21/2018 0714   BILITOT 1.4 (H) 12/21/2018 0714   GFRNONAA 8 (L) 12/26/2018 0637   GFRNONAA 40 (L) 09/12/2013 0736   GFRNONAA 74 09/12/2012 1113   GFRAA 9 (L) 12/26/2018 0637   GFRAA 46 (L) 09/12/2013 0736   GFRAA 85 09/12/2012 1113   Lipase     Component Value Date/Time   LIPASE 60 (H) 12/19/2018 1932       Studies/Results: Ct Abdomen Pelvis W Contrast  Result Date: 12/25/2018 CLINICAL DATA:  Follow-up liver abscess drainage. EXAM: CT ABDOMEN AND PELVIS WITH CONTRAST TECHNIQUE: Multidetector CT imaging of the abdomen and pelvis was performed using the standard protocol following bolus administration of intravenous contrast. CONTRAST:  168mL OMNIPAQUE IOHEXOL 300 MG/ML  SOLN COMPARISON:  12/19/2018 FINDINGS: Lower chest: Small right pleural effusion without evident loculation, new. There is bilateral lower lobe atelectasis. Circumferentially thickened appearance of the esophagus which could reflect esophagitis. Hepatobiliary: Percutaneous drainage catheter into a right lower lobe liver abscess which is both fluid density and gas, still 4.3 cm in diameter. It is unclear how much of this is liquified as the abscess has an irregular rim. Small volume fluid right in for hepatic (less than a cm) with a 8 mm  calcification. Multiple appendicoliths were present on the preoperative CT. Pericholecystic edema which is likely reactive as there is no gallbladder wall distension. Probable gallstone. Pancreas: Unremarkable. Spleen: Unremarkable. Adrenals/Urinary Tract: Negative adrenals. No hydronephrosis or stone. Unremarkable bladder. Stomach/Bowel: Generalized prominent colonic wall thickness only where collapsed, likely physiologic. No pericolonic inflammation except except where there is fat edema contiguous with the liver. Appendectomy. Vascular/Lymphatic: No acute vascular abnormality. Arterial calcification. No mass or adenopathy. Reproductive:Negative Other: Gas and  fluid within the abdominal wall at the level of the umbilicus, measuring 3.5 cm. Musculoskeletal: No acute abnormalities. L4 chronic bilateral pars defects. Lower lumbar disc and facet degeneration. IMPRESSION: 1. Liver abscess with percutaneous drainage catheter in good position. The area of intrahepatic phlegmon/abscess measures up to 4.3 cm. The abscess is poorly marginated and it is unclear how much is liquified. There is persistent right subhepatic small volume fluid collection with high-density area that likely reflects dropped appendicolith. 2. 3.5 cm collection at the umbilicus, possible port site abscess. 3. Lower lobe atelectasis and new small right pleural effusion. Electronically Signed   By: Monte Fantasia M.D.   On: 12/25/2018 12:00    Anti-infectives: Anti-infectives (From admission, onward)   Start     Dose/Rate Route Frequency Ordered Stop   12/25/18 1200  cefTAZidime (FORTAZ) injection 2 g  Status:  Discontinued     2 g Intramuscular Every M-W-F (Hemodialysis) 12/24/18 1124 12/24/18 1457   12/25/18 1200  cefTAZidime (FORTAZ) 2 g in sodium chloride 0.9 % 100 mL IVPB     2 g 200 mL/hr over 30 Minutes Intravenous Every M-W-F (Hemodialysis) 12/24/18 1457     12/24/18 1600  metroNIDAZOLE (FLAGYL) tablet 500 mg     500 mg Oral Every 8 hours 12/24/18 1042     12/22/18 2200  cefTRIAXone (ROCEPHIN) 2 g in sodium chloride 0.9 % 100 mL IVPB  Status:  Discontinued     2 g 200 mL/hr over 30 Minutes Intravenous Every 24 hours 12/22/18 1044 12/24/18 1042   12/20/18 2200  ceFEPIme (MAXIPIME) 1 g in sodium chloride 0.9 % 100 mL IVPB  Status:  Discontinued     1 g 200 mL/hr over 30 Minutes Intravenous Every 24 hours 12/20/18 0126 12/22/18 1044   12/20/18 1200  vancomycin (VANCOCIN) IVPB 1000 mg/200 mL premix  Status:  Discontinued     1,000 mg 200 mL/hr over 60 Minutes Intravenous Every M-W-F (Hemodialysis) 12/20/18 0126 12/21/18 1132   12/19/18 2330  metroNIDAZOLE (FLAGYL) IVPB 500 mg   Status:  Discontinued     500 mg 100 mL/hr over 60 Minutes Intravenous Every 8 hours 12/19/18 2320 12/24/18 1042   12/19/18 2330  ceFEPIme (MAXIPIME) 2 g in sodium chloride 0.9 % 100 mL IVPB     2 g 200 mL/hr over 30 Minutes Intravenous  Once 12/19/18 2324 12/20/18 0053   12/19/18 2330  vancomycin (VANCOCIN) 2,000 mg in sodium chloride 0.9 % 500 mL IVPB     2,000 mg 250 mL/hr over 120 Minutes Intravenous  Once 12/19/18 2324 12/20/18 0407       Assessment/Plan ESRD DM ETOH  HTN HLD  Leukocytosis -22K today, stable Nausea/vomiting/diarrhea -overall improving.  Eating some Liver abscesses -s/p IR drain placementon 1/16. Leave drain for now, will need drain clinic follow up as outpatient. -cx shows exact same organisms as blood cultures -Repeat CT shows stability in her abdomen.  Do not suspect fluid collection noted under periumbilical incision is truly an abscess given no pain, fluctuance, or erythema.  May just be a seroma. Bacteremia CX showENTEROBACTER SPECIESandKLEBSIELLA OXYTOCA -per medicine  POD 20,s/p lap appy for gangrenous appendicitis1/1/20 Dr. Kieth Brightly - no abdominal pain on my exam - incisions c/d/i -residual appendicolith noted on CT and likely source of hepatic abscess. Patient has follow up set up with Dr. Kieth Brightly as she will likely require dx lap in the future to remove this appendicolith to fully resolve this problem. -no surgery planned this admission  FEN -renal diet as able VTE -SCDs ID -maxipime/flagyl/vanc 1/14>>1/17 Rocephin--> 1/19 Flagyl 1/17 -->  Tressie Ellis 1/19 -->   LOS: 6 days    Henreitta Cea , [facility] 12/26/2018, 8:07 AM Pager: [phone]

## 2018-12-26 NOTE — Care Management Note (Signed)
Case Management Note Manya Silvas, RN MSN CCM Transitions of Care [address] [place] [phone]  Patient Details  Name: [name] MRN: [id number] Date of Birth: 08/30/1966  Subjective/Objective:      Liver abcess              Action/Plan: PTA home with boyfriend Ronalee Belts) who assists patient with medications. Pt received aide services 6 days a week from [facility]. Pt would like her RN services to be with [facility]. Pt states that Ronalee Belts [phone]) will provide transportation home today. Prior to transition home bedside RN will teach Ronalee Belts how to care for drain. Will continue to follow for transition of care needs.   Expected Discharge Date:                  Expected Discharge Plan:  [place]  In-House Referral:  NA  Discharge planning Services  CM Consult  Post Acute Care Choice:  NA Choice offered to:  Patient  DME Arranged:  N/A DME Agency:  NA  HH Arranged:  RN [place] Agency:  Other - See comment([facility])  Status of Service:  In process, will continue to follow  If discussed at Long Length of Stay Meetings, dates discussed:    Additional Comments: 12/26/2018 - Call to [facility] - they do not provided skilled services. Referral for [facility] accepted by [facility].  Anticipate transition home later today.   Bartholomew Crews, RN 12/26/2018, 10:35 AM

## 2018-12-26 NOTE — Care Management Important Message (Signed)
Important Message  Patient Details  Name: [name] MRN: [id number] Date of Birth: June 16, 1966   Medicare Important Message Given:  Yes  Due to illness patient is not able to sign.  Unsigned copy left.  Naoki Migliaccio 12/26/2018, 2:45 PM

## 2018-12-27 ENCOUNTER — Other Ambulatory Visit: Payer: Self-pay | Admitting: General Surgery

## 2018-12-27 DIAGNOSIS — E1122 Type 2 diabetes mellitus with diabetic chronic kidney disease: Secondary | ICD-10-CM | POA: Diagnosis not present

## 2018-12-27 DIAGNOSIS — K750 Abscess of liver: Secondary | ICD-10-CM

## 2018-12-27 DIAGNOSIS — A419 Sepsis, unspecified organism: Secondary | ICD-10-CM | POA: Diagnosis not present

## 2018-12-27 DIAGNOSIS — R197 Diarrhea, unspecified: Secondary | ICD-10-CM | POA: Insufficient documentation

## 2018-12-27 DIAGNOSIS — E876 Hypokalemia: Secondary | ICD-10-CM | POA: Diagnosis not present

## 2018-12-27 DIAGNOSIS — D631 Anemia in chronic kidney disease: Secondary | ICD-10-CM | POA: Diagnosis not present

## 2018-12-27 DIAGNOSIS — N186 End stage renal disease: Secondary | ICD-10-CM | POA: Diagnosis not present

## 2018-12-27 DIAGNOSIS — N2581 Secondary hyperparathyroidism of renal origin: Secondary | ICD-10-CM | POA: Diagnosis not present

## 2018-12-28 LAB — CULTURE, BLOOD (ROUTINE X 2): Special Requests: ADEQUATE

## 2018-12-29 ENCOUNTER — Other Ambulatory Visit: Payer: Self-pay | Admitting: *Deleted

## 2018-12-29 DIAGNOSIS — A419 Sepsis, unspecified organism: Secondary | ICD-10-CM | POA: Diagnosis not present

## 2018-12-29 DIAGNOSIS — N2581 Secondary hyperparathyroidism of renal origin: Secondary | ICD-10-CM | POA: Diagnosis not present

## 2018-12-29 DIAGNOSIS — N186 End stage renal disease: Secondary | ICD-10-CM | POA: Diagnosis not present

## 2018-12-29 DIAGNOSIS — D631 Anemia in chronic kidney disease: Secondary | ICD-10-CM | POA: Diagnosis not present

## 2018-12-29 DIAGNOSIS — E1122 Type 2 diabetes mellitus with diabetic chronic kidney disease: Secondary | ICD-10-CM | POA: Diagnosis not present

## 2018-12-29 DIAGNOSIS — E876 Hypokalemia: Secondary | ICD-10-CM | POA: Diagnosis not present

## 2018-12-29 LAB — CULTURE, BLOOD (ROUTINE X 2)
Culture: NO GROWTH
Culture: NO GROWTH
Special Requests: ADEQUATE
Special Requests: ADEQUATE

## 2018-12-29 NOTE — Patient Outreach (Signed)
[facility]) Care Management  12/29/2018  GRACLYNN VANANTWERP March 31, 1966 [id number]   Subjective: Telephone call to patient's home number, no answer, left HIPAA compliant voicemail message, and requested call back.     Objective: Per KPN (Knowledge Performance Now, point of care tool) and chart review, patient hospitalized 12/19/2018 -12/26/2018 for hepatic abscess.   Patient  hospitalized 12/06/2018 - 12/09/2018 for acute gangrenous appendicitis and status post  laparoscopic appendectomy on 12/06/2018.   Patient also has a history of ESRD on hemodialysis, hypertension, diabetes, sepsis, blindness, and right BKA.        Assessment: Received Medicare EMMI General Discharge follow up referral on 12/29/2018.  Red flag alert, Day #1, patient answered no to the following question: Scheduled follow-up?   [place] EMMI follow up pending patient contact.       Plan: RNCM will send unsuccessful outreach  letter, [facility] pamphlet, handout: Know Before You Go, will call patient for 2nd telephone outreach attempt within 4 business days, [facility] EMMI follow up, and proceed with case closure, within 10 business days if no return call.            Ivery Nanney H. Annia Friendly, BSN, [place] Management [facility] Telephonic CM Phone: [phone] Fax: [phone]

## 2019-01-01 DIAGNOSIS — E1122 Type 2 diabetes mellitus with diabetic chronic kidney disease: Secondary | ICD-10-CM | POA: Diagnosis not present

## 2019-01-01 DIAGNOSIS — D631 Anemia in chronic kidney disease: Secondary | ICD-10-CM | POA: Diagnosis not present

## 2019-01-01 DIAGNOSIS — A419 Sepsis, unspecified organism: Secondary | ICD-10-CM | POA: Diagnosis not present

## 2019-01-01 DIAGNOSIS — N186 End stage renal disease: Secondary | ICD-10-CM | POA: Diagnosis not present

## 2019-01-01 DIAGNOSIS — E876 Hypokalemia: Secondary | ICD-10-CM | POA: Diagnosis not present

## 2019-01-01 DIAGNOSIS — N2581 Secondary hyperparathyroidism of renal origin: Secondary | ICD-10-CM | POA: Diagnosis not present

## 2019-01-02 ENCOUNTER — Other Ambulatory Visit: Payer: Self-pay

## 2019-01-02 ENCOUNTER — Other Ambulatory Visit: Payer: Self-pay | Admitting: *Deleted

## 2019-01-02 ENCOUNTER — Encounter: Payer: Self-pay | Admitting: Family Medicine

## 2019-01-02 ENCOUNTER — Ambulatory Visit (INDEPENDENT_AMBULATORY_CARE_PROVIDER_SITE_OTHER): Payer: Medicare Other | Admitting: Family Medicine

## 2019-01-02 VITALS — BP 135/60 | HR 94 | Temp 98.6°F

## 2019-01-02 DIAGNOSIS — N186 End stage renal disease: Secondary | ICD-10-CM

## 2019-01-02 DIAGNOSIS — E0822 Diabetes mellitus due to underlying condition with diabetic chronic kidney disease: Secondary | ICD-10-CM | POA: Diagnosis not present

## 2019-01-02 DIAGNOSIS — I10 Essential (primary) hypertension: Secondary | ICD-10-CM

## 2019-01-02 DIAGNOSIS — Z0000 Encounter for general adult medical examination without abnormal findings: Secondary | ICD-10-CM | POA: Diagnosis not present

## 2019-01-02 DIAGNOSIS — Z794 Long term (current) use of insulin: Secondary | ICD-10-CM | POA: Diagnosis not present

## 2019-01-02 DIAGNOSIS — K750 Abscess of liver: Secondary | ICD-10-CM | POA: Diagnosis not present

## 2019-01-02 DIAGNOSIS — Z992 Dependence on renal dialysis: Secondary | ICD-10-CM | POA: Diagnosis not present

## 2019-01-02 NOTE — Patient Outreach (Signed)
[place] [facility] [facility]) Care Management  01/02/2019  TERYL GUBLER 02-03-1966 [id number]   Subjective: Telephone call to patient's home number, no answer, left HIPAA compliant voicemail message, and requested call back.     Objective: Per KPN (Knowledge Performance Now, point of care tool) and chart review, patient hospitalized 12/19/2018 -12/26/2018 for hepatic abscess.   Patient  hospitalized 12/06/2018 - 12/09/2018 for acutegangrenousappendicitis and status post laparoscopic appendectomy on 12/06/2018.   Patient also has a history of ESRD on hemodialysis, hypertension, diabetes, sepsis, blindness, and right BKA.        Assessment: Received Medicare EMMI General Discharge follow up referral on 12/29/2018.  Red flag alert, Day #1, patient answered no to the following question: Scheduled follow-up?   [facility] EMMI follow up pending patient contact.       Plan: RNCM has sent unsuccessful outreach  letter, [facility] pamphlet, handout: Know Before You Go, will call patient for 3rd telephone outreach attempt within 4 business days, [facility] EMMI follow up, and proceed with case closure, within 10 business days if no return call.           Nickolus Wadding H. Annia Friendly, BSN, [place] Management [facility] Telephonic CM Phone: [phone] Fax: [phone]

## 2019-01-02 NOTE — Progress Notes (Signed)
Subjective:    Patient ID: [name], female    DOB: August 26, 1966, 53 y.o.   MRN: [id number]  CC: hospital f/u  HPI: [name] is a 53 year old female with history of uncontrolled type 2 diabetes, ESRD on HD Monday, Wednesday, Friday, hypertension, and hypercholesterolemia.  Hospital follow-up: In early January 2020 the patient developed appendicitis for which she had an appendectomy.  Shortly after, the patient developed abdominal pain and tenderness and was found to have a liver abscess.  At the time, she was hospitalized and found to have sepsis due to Klebsiella pneumoniae and Enterobacter bacteremia.  She was treated with IV antibiotics, a right upper quadrant drain was placed to relieve the infection, she was stabilized, and sent home on p.o. Flagyl and ceftazidime every Monday, Wednesday, and Friday before hemodialysis.  She reports feeling much better today and denies fevers, chills, and abdominal pain.  She states she is following up with surgery in a couple of weeks to look into removing the drain.  Type 2 diabetes: The patient currently has uncontrolled type 2 diabetes and is currently blind, with end-stage renal disease with hemodialysis, and has experienced the amputation of her left thumb, right index finger, and right lower extremity below the knee.  The patient was taking 70/30 insulin that was drawn and injected for her because she is blind.  In the hospital she was switched to Levemir 10 units.  The patient is currently unsure whether or not she is taking the Levemir or the other insulin.  She states her blood sugars are only checked once daily in the mornings and usually run in the 200s.  Otherwise she denies urinary frequency, polyuria, and polydipsia.  She has a history of neuropathy for which she also takes Neurontin and denies worsening neuropathy at today's visit.  Her most recent HbA1c was drawn 12/25/2018 and was 8.4%, up from 6.9%.  Health maintenance: The patient has  a history of high blood pressure, her blood pressure was 135/60 at today's visit.  She is continuing to take amlodipine and Cozaar.  She is not checking her blood pressures at home.  She continues to take Lipitor 40 mg every evening for history of hyperlipidemia.  She is currently due for a colonoscopy.  She denies personal or family history of colon cancer.  Smoking status reviewed: No smoking  Review of Systems  Constitutional: Negative for chills, fever and malaise/fatigue.  Respiratory: Negative for shortness of breath.   Gastrointestinal: Negative for abdominal pain, diarrhea, nausea and vomiting.  Genitourinary: Negative for dysuria, frequency and urgency.  Psychiatric/Behavioral: Negative for depression.    Objective:  BP 135/60   Pulse 94   Temp 98.6 F (37 C) (Oral)   SpO2 97%   General: well nourished, in no acute distress HEENT: normocephalic, TM's visualized bilaterally, no scleral icterus or conjunctival pallor, no nasal discharge, moist mucous membranes, good dentition without erythema or discharge noted in posterior oropharynx Neck: supple, non-tender, without lymphadenopathy Cardiac: RRR, clear S1 and S2, no murmurs, rubs, or gallops Respiratory: clear to auscultation bilaterally, no increased work of breathing Abdomen: soft, nontender, nondistended, no masses or organomegaly. Bowel sounds present Extremities: Right BKA, amputated left thumb and right index finger.  No edema or cyanosis.   Assessment & Plan:   Diabetes mellitus due to underlying condition with chronic kidney disease on chronic dialysis ([place]) Most recent HbA1c on 12/25/2018 with 8.4%, increased from 6.9%. -Patient instructed to check blood sugar at least once daily and to  start writing down blood sugars. Bring the blood sugar log into the  - Patient does not believe she has gotten her Levemir yet. She was instructed to start using the Levemir if she isn't already and to increase the Levemir from 10 units  daily to 15 units daily.   Liver abscess Signs and symptoms of liver abscess are much improved.  Patient without abdominal pain, nausea, vomiting, diarrhea, fevers, or chills. - Continue flagyl and Ceftaz antibiotics as prescribed. - Follow up with Surgery and other outpatient appointments for management of the liver abscess.  Planning to see GI in February 2020. -Patient instructed to continue to flush right upper quadrant abdominal drain as instructed.   Hypertension Blood pressure at today's visit 135/60.  Patient denying headaches, vision changes, chest pain, or shortness of breath. -Patient instructed to continue losartan and amlodipine for control of blood pressure.  Healthcare maintenance - Patient is due for a colonoscopy and referred for one at this visit. Follow up with GI to schedule this appointment and have this procedure. - Keep taking other medications as prescribed for high blood pressure, high cholesterol, and other chronic conditions.   Return in about 3 months (around 04/03/2019) for HbA1c and Diabetes follow up.   Dr. Milus Banister [facility] Family Medicine, PGY-1

## 2019-01-02 NOTE — Assessment & Plan Note (Signed)
-   Patient is due for a colonoscopy and referred for one at this visit. Follow up with GI to schedule this appointment and have this procedure. - Keep taking other medications as prescribed for high blood pressure, high cholesterol, and other chronic conditions.

## 2019-01-02 NOTE — Assessment & Plan Note (Addendum)
Most recent HbA1c on 12/25/2018 with 8.4%, increased from 6.9%. -Patient instructed to check blood sugar at least once daily and to start writing down blood sugars. Bring the blood sugar log into the  - Patient does not believe she has gotten her Levemir yet. She was instructed to start using the Levemir if she isn't already and to increase the Levemir from 10 units daily to 15 units daily.

## 2019-01-02 NOTE — Patient Instructions (Addendum)
Thank you for coming in to see [place] today! Please see below to review our plan for today's visit:  1. Continue your antibiotics as prescribed. Follow up with Surgery and other outpatient appointments for management of the liver abscess. 2. You are due for a colonoscopy and was referred for one at this visit. Follow up with GI to schedule this appointment and have this procedure. 3. If you are taking Levemir 10 units daily, increase your Levemir to 15 units daily. Try to keep a written record of your blood sugars! 4. Keep taking your other medications as prescribed for high blood pressure, high cholesterol, and other chronic conditions.    Please call the clinic at [phone] if your symptoms worsen or you have any concerns. It was our pleasure to serve you!  Dr. Milus Banister [facility] Family Medicine

## 2019-01-02 NOTE — Assessment & Plan Note (Signed)
Signs and symptoms of liver abscess are much improved.  Patient without abdominal pain, nausea, vomiting, diarrhea, fevers, or chills. - Continue flagyl and Ceftaz antibiotics as prescribed. - Follow up with Surgery and other outpatient appointments for management of the liver abscess.  Planning to see GI in February 2020. -Patient instructed to continue to flush right upper quadrant abdominal drain as instructed.

## 2019-01-02 NOTE — Assessment & Plan Note (Signed)
Blood pressure at today's visit 135/60.  Patient denying headaches, vision changes, chest pain, or shortness of breath. -Patient instructed to continue losartan and amlodipine for control of blood pressure.

## 2019-01-03 ENCOUNTER — Other Ambulatory Visit: Payer: Medicare Other

## 2019-01-03 DIAGNOSIS — E876 Hypokalemia: Secondary | ICD-10-CM | POA: Diagnosis not present

## 2019-01-03 DIAGNOSIS — D631 Anemia in chronic kidney disease: Secondary | ICD-10-CM | POA: Diagnosis not present

## 2019-01-03 DIAGNOSIS — N186 End stage renal disease: Secondary | ICD-10-CM | POA: Diagnosis not present

## 2019-01-03 DIAGNOSIS — A419 Sepsis, unspecified organism: Secondary | ICD-10-CM | POA: Diagnosis not present

## 2019-01-03 DIAGNOSIS — E1122 Type 2 diabetes mellitus with diabetic chronic kidney disease: Secondary | ICD-10-CM | POA: Diagnosis not present

## 2019-01-03 DIAGNOSIS — N2581 Secondary hyperparathyroidism of renal origin: Secondary | ICD-10-CM | POA: Diagnosis not present

## 2019-01-04 ENCOUNTER — Ambulatory Visit
Admission: RE | Admit: 2019-01-04 | Discharge: 2019-01-04 | Disposition: A | Discharge status: Home / Self Care | Payer: Medicare Other | Source: Ambulatory Visit | Attending: General Surgery | Admitting: General Surgery

## 2019-01-04 ENCOUNTER — Other Ambulatory Visit: Payer: Self-pay | Admitting: *Deleted

## 2019-01-04 ENCOUNTER — Encounter: Payer: Self-pay | Admitting: Radiology

## 2019-01-04 ENCOUNTER — Other Ambulatory Visit: Payer: Self-pay | Admitting: General Surgery

## 2019-01-04 ENCOUNTER — Ambulatory Visit
Admission: RE | Admit: 2019-01-04 | Discharge: 2019-01-04 | Disposition: A | Discharge status: Home / Self Care | Payer: Medicare Other | Source: Ambulatory Visit | Attending: Physician Assistant | Admitting: Physician Assistant

## 2019-01-04 DIAGNOSIS — K750 Abscess of liver: Secondary | ICD-10-CM

## 2019-01-04 DIAGNOSIS — Z4689 Encounter for fitting and adjustment of other specified devices: Secondary | ICD-10-CM | POA: Diagnosis not present

## 2019-01-04 HISTORY — PX: IR RADIOLOGIST EVAL & MGMT: IMG5224

## 2019-01-04 MED ORDER — IOPAMIDOL (ISOVUE-300) INJECTION 61%
100.0000 mL | Freq: Once | INTRAVENOUS | Status: AC | PRN
  Administered 2019-01-04: 100 mL via INTRAVENOUS

## 2019-01-04 NOTE — Patient Outreach (Signed)
[facility]) Care Management  01/04/2019  [name] 11-12-1966 [id number]   Subjective: Telephone call to patient's home number, no answer, message states mailbox full, and unable to leave a message.   Objective:Per KPN (Knowledge Performance Now, point of care tool) and chart review,patient hospitalized 12/19/2018 -12/26/2018 for hepatic abscess. Patient hospitalized 12/06/2018 - 12/09/2018 for acutegangrenousappendicitisand status postlaparoscopic appendectomyon 12/06/2018. Patient also has a history of ESRD on hemodialysis, hypertension, diabetes, sepsis, blindness, and right BKA.      Assessment: Received Medicare EMMI General Discharge follow up referral on 12/29/2018. Red flag alert, Day #1, patient answered no to the following question: Scheduled follow-up?[facility] EMMI follow up pending patient contact.      Plan:RNCM has sent unsuccessful outreach letter, [facility] pamphlet, handout: Know Before You Go,will proceed with case closure, within 10 business days if no return call.     Arnita Koons H. Annia Friendly, BSN, [place] Management [facility] Telephonic CM Phone: [phone] Fax: [phone]

## 2019-01-04 NOTE — Progress Notes (Signed)
Patient ID: [name], female   DOB: 02/13/1966, 52 y.o.   MRN: [id number]       Chief Complaint:  Liver abscess, outpatient follow-up  Referring Physician(s): Watterson,Shannon A  History of Present Illness: [name] is a 52 y.o. female patient returns for outpatient follow-up for a liver abscess drain.  Drain was placed 12/21/2018.  Minimal purulent output.  No blood or bile within the suction bulb.  She returns for outpatient CT and drain injection.  She remains on oral antibiotics.  No significant Donel pain.  Stable weight and appetite.  Normal bowel habits.  Overall she is feeling better.  Past Medical History:  Diagnosis Date  . Alcohol dependency (HCC)   . Anemia   . Chronic kidney disease   . Diabetes mellitus    type 2  . Diabetes mellitus type 2, uncontrolled (HCC)   . Hypertension   . Legally blind in left eye, as defined in [place]   . Necrotizing fasciitis (HCC)   . PONV (postoperative nausea and vomiting)     Past Surgical History:  Procedure Laterality Date  . AMPUTATION     right first and left middle finger amputation 2/2 OM 08/2011 by Dr. Gramig  . AMPUTATION  01/18/2012   Procedure: AMPUTATION DIGIT;  Surgeon: Kevin R Kuzma, MD;  Location: [facility];  Service: Orthopedics;  Laterality: Left;  revision amputation left thumb  . AMPUTATION  08/25/2012   Procedure: AMPUTATION BELOW KNEE;  Surgeon: Marcus V Duda, MD;  Location: [facility] OR;  Service: Orthopedics;  Laterality: Right;  Right Below Knee Amputation  . BASCILIC VEIN TRANSPOSITION Left 07/22/2015   Procedure: BASILIC VEIN TRANSPOSITION ;  Surgeon: Charles E Fields, MD;  Location: [facility];  Service: Vascular;  Laterality: Left;  . CATARACT EXTRACTION     right   . CESAREAN SECTION  1986  . CESAREAN SECTION  1985  . CESAREAN SECTION  1991  . I&D EXTREMITY  12/24/2011   Procedure: IRRIGATION AND DEBRIDEMENT EXTREMITY;  Surgeon: Kevin R Kuzma, MD;  Location: [facility];  Service: Orthopedics;   Laterality: Left;  I&Dleft thumb with partial amputation  . I&D EXTREMITY  12/26/2011   Procedure: IRRIGATION AND DEBRIDEMENT EXTREMITY;  Surgeon: Kevin R Kuzma, MD;  Location: [facility];  Service: Orthopedics;  Laterality: Left;  . I&D EXTREMITY  12/23/2011   Procedure: IRRIGATION AND DEBRIDEMENT EXTREMITY;  Surgeon: Kevin R Kuzma, MD;  Location: [facility];  Service: Orthopedics;  Laterality: Left;  I&D Left thumb, hand and arm  . I&D left thumb  12/23/2011  . IR RADIOLOGIST EVAL & MGMT  01/04/2019  . LAPAROSCOPIC APPENDECTOMY N/A 12/06/2018   Procedure: APPENDECTOMY LAPAROSCOPIC;  Surgeon: Kinsinger, Luke Aaron, MD;  Location: [facility];  Service: General;  Laterality: N/A;  . TUBAL LIGATION      Allergies: Patient has no known allergies.  Medications: Prior to Admission medications   Medication Sig Start Date End Date Taking? Authorizing Provider  amLODipine (NORVASC) 10 MG tablet TAKE 1 TABLET BY MOUTH ONCE DAILY Patient taking differently: 10 mg daily.  05/11/18   Rumball, Alison, DO  atorvastatin (LIPITOR) 40 MG tablet TAKE 1 TABLET BY MOUTH ONCE DAILY Patient taking differently: Take 40 mg by mouth daily at 6 PM.  05/11/18   Rumball, Alison, DO  B Complex-C-Folic Acid (DIALYVITE 800) 0.8 MG TABS Take 1 tablet by mouth daily. 10/09/18   [provider]  Blood Glucose Monitoring Suppl (PRODIGY AUTOCODE BLOOD GLUCOSE) w/Device KIT   1 Device by Does not apply route 3 (three) times daily. 10/26/18   Rumball, Alison, DO  cefTAZidime (FORTAZ) IVPB Inject 2 g into the vein every Monday, Wednesday, and Friday with hemodialysis. Indication:  Liver abcess and polymicrobial bacteremia Last Day of Therapy:  February 06, 2019 Labs - Once weekly:  CBC/D and BMP, Labs - Every other week:  ESR and CRP 12/26/18 02/08/19  Olson, Daniel K, MD  gabapentin (NEURONTIN) 100 MG capsule TAKE 1 CAPSULE BY MOUTH ONCE DAILY AS NEEDED Patient taking differently: Take 100 mg by mouth daily.  11/01/18   Rumball, Alison, DO  glucose  blood test strip Use as instructed 10/26/18   Rumball, Alison, DO  insulin detemir (LEVEMIR) 100 UNIT/ML injection Inject 0.1 mLs (10 Units total) into the skin daily. 12/27/18   Olson, Daniel K, MD  Insulin Pen Needle (EASY TOUCH PEN NEEDLES) 32G X 4 MM MISC 1 each by Does not apply route 3 (three) times daily. 08/10/18   Rumball, Alison, DO  Lancet Devices (ONE TOUCH DELICA LANCING DEV) MISC Check blood sugars once daily 05/25/18   Mayo, Katy Dodd, MD  losartan (COZAAR) 50 MG tablet Take 1 tablet (50 mg total) by mouth daily. 08/10/18   Rumball, Alison, DO  metroNIDAZOLE (FLAGYL) 500 MG tablet Take 1 tablet (500 mg total) by mouth every 8 (eight) hours. 12/26/18 02/08/19  Olson, Daniel K, MD  mupirocin cream (BACTROBAN) 2 % Apply 1 application topically 2 (two) times daily. 10/26/17   Mayo, Katy Dodd, MD  pantoprazole (PROTONIX) 40 MG tablet Take 1 tablet (40 mg total) by mouth daily for 30 days. 12/27/18 01/26/19  Olson, Daniel K, MD  PRODIGY LANCETS 28G MISC 1 each by Does not apply route 3 (three) times daily. 10/26/18   Rumball, Alison, DO  promethazine (PHENERGAN) 12.5 MG tablet Take 1 tablet (12.5 mg total) by mouth every 6 (six) hours as needed for up to 10 doses for nausea or vomiting. 12/26/18   Olson, Daniel K, MD     Family History  Problem Relation Age of Onset  . Diabetes Mother   . Alcohol abuse Mother   . Diabetes Father   . Alzheimer's disease Father   . Hypertension Father   . Kidney failure Sister   . Diabetes Sister   . High blood pressure Sister     Social History   Socioeconomic History  . Marital status: Single    Spouse name: Not on file  . Number of children: 3  . Years of education: Not on file  . Highest education level: Not on file  Occupational History    Employer: [employer]  Social Needs  . Financial resource strain: Not on file  . Food insecurity:    Worry: Not on file    Inability: Not on file  . Transportation needs:    Medical: Not on file    Non-medical: Not  on file  Tobacco Use  . Smoking status: Never Smoker  . Smokeless tobacco: Never Used  Substance and Sexual Activity  . Alcohol use: Yes    Alcohol/week: 1.0 standard drinks    Types: 1 Cans of beer per week    Comment: h/o alcohol abuse 4 years ago (07/21/15)  . Drug use: Yes    Types: Cocaine    Comment:  last used cocaine 4 years ago (07/21/15)  . Sexual activity: Yes  Lifestyle  . Physical activity:    Days per week: Not on file    Minutes   per session: Not on file  . Stress: Not on file  Relationships  . Social connections:    Talks on phone: Not on file    Gets together: Not on file    Attends religious service: Not on file    Active member of club or organization: Not on file    Attends meetings of clubs or organizations: Not on file    Relationship status: Not on file  Other Topics Concern  . Not on file  Social History Narrative           Review of Systems: A 12 point ROS discussed and pertinent positives are indicated in the HPI above.  All other systems are negative.  Review of Systems  Vital Signs: BP (!) 158/77   Pulse 94   Temp 98.5 F (36.9 C)   SpO2 98%   Physical Exam Constitutional:      General: She is not in acute distress.    Appearance: She is not diaphoretic.  Abdominal:     General: Bowel sounds are normal. There is no distension.     Palpations: Abdomen is soft.     Comments: Right upper quadrant drain catheter site is clean, dry and intact.  Musculoskeletal:     Comments: Previous right below-knee amputation  Skin:    General: Skin is warm and dry.  Neurological:     Mental Status: She is alert.  Psychiatric:        Mood and Affect: Mood normal.      Imaging: Ct Abdomen Pelvis Wo Contrast  Result Date: 12/06/2018 CLINICAL DATA:  Acute onset of RIGHT LOWER QUADRANT abdominal pain that began yesterday during hemodialysis. Solitary episode of vomiting. EXAM: CT ABDOMEN AND PELVIS WITHOUT CONTRAST TECHNIQUE: Multidetector CT  imaging of the abdomen and pelvis was performed following the standard protocol without IV contrast. COMPARISON:  None. FINDINGS: Lower chest: Atelectasis involving the lower lobes. Visualized lung bases otherwise clear. Heart moderately enlarged. Hepatobiliary: Normal unenhanced appearance of the liver. Prominent Riedel's lobe. Gallbladder normal in appearance without calcified gallstones. No biliary ductal dilation. Pancreas: Normal unenhanced appearance. Spleen: Normal unenhanced appearance. Adrenals/Urinary Tract: Normal appearing adrenal glands. No evidence of urinary tract calculi. Within the limits of the unenhanced technique, no focal parenchymal abnormality involving either kidney. No evidence of hydronephrosis involving either kidney. Normal appearing urinary bladder. Stomach/Bowel: Very small hiatal hernia. Stomach decompressed and otherwise unremarkable. Normal-appearing small bowel. Mobile cecum positioned in the RIGHT UPPER QUADRANT of the abdomen. Moderate stool burden throughout the normal appearing colon. Appendix: Location: RIGHT UPPER pelvis, LATERAL retrocecal location. Diameter: 16 mm Appendicolith: Multiple. Mucosal hyper-enhancement: Moderate. Extraluminal gas: Absent. Periappendiceal collection: Absent. Marked edema/inflammation surrounding the appendix. Vascular/Lymphatic: Mild to moderate iliofemoral atherosclerosis. No evidence of aortic aneurysm. No pathologic lymphadenopathy. Reproductive: Normal-appearing uterus and ovaries without evidence of adnexal mass. Other: None. Musculoskeletal: BILATERAL L4 pars defects without evidence of spondylolisthesis. Mild degenerative disc disease at L4-5 with circumferential disc bulge. Mild multifactorial spinal stenosis at the L4-5 level. No acute findings. IMPRESSION: 1. Acute appendicitis. No evidence of perforation or abscess. 2. Atelectasis involving the lower lobes. 3. Very small hiatal hernia. 4. BILATERAL L4 pars defects without evidence of  spondylolisthesis. Mild multifactorial spinal stenosis at L4-5. I telephoned these critical/emergent results to [facility], [place], in the emergency department at the time of interpretation on 12/06/2018 at 7:12 p.m. Electronically Signed   By: Evangeline Dakin M.D.   On: 12/06/2018 19:13   Dg Chest 2 View  Result  Date: 12/19/2018 CLINICAL DATA:  Nausea, vomiting, and diarrhea. Generalized body aches over the last day. History of hypertension and dialysis. EXAM: CHEST - 2 VIEW COMPARISON:  11/16/2017 FINDINGS: Mild cardiac enlargement. Linear atelectasis in the lung bases. No airspace disease or consolidation in the lungs. No blunting of costophrenic angles. No pneumothorax. Mediastinal contours appear intact. Degenerative changes in the shoulders. IMPRESSION: Mild cardiac enlargement. Linear atelectasis in the lung bases. No evidence of active pulmonary disease. Electronically Signed   By: Lucienne Capers M.D.   On: 12/19/2018 21:42   Ct Abdomen Pelvis W Contrast  Result Date: 01/04/2019 CLINICAL DATA:  Hepatic abscess, status post percutaneous drain 12/21/2018, outpatient follow-up EXAM: CT ABDOMEN AND PELVIS WITH CONTRAST TECHNIQUE: Multidetector CT imaging of the abdomen and pelvis was performed using the standard protocol following bolus administration of intravenous contrast. CONTRAST:  126m ISOVUE-300 IOPAMIDOL (ISOVUE-300) INJECTION 61% COMPARISON:  12/21/2018, 12/25/2018 FINDINGS: Lower chest: Minor basilar atelectasis or scarring Hepatobiliary: Right inferior hepatic abscess drain is stable in position. The hepatic air-fluid collection at the drain catheter site is smaller 2.4 cm, previously 4 3 cm. No other hepatic abnormality. Gallbladder and biliary system. Common bile duct nondilated. Pancreas: Unremarkable. No pancreatic ductal dilatation or surrounding inflammatory changes. Spleen: Normal in size without focal abnormality. Adrenals/Urinary Tract: Adrenal glands are unremarkable. Kidneys  are normal, without renal calculi, focal lesion, or hydronephrosis. Bladder is unremarkable. Stomach/Bowel: Negative for bowel obstruction, significant dilatation, ileus, free air. No other fluid collection, abscess, hemorrhage, hematoma ascites. Moderate colonic stool burden. Appendix not visualized Vascular/Lymphatic: Extensive abdominal atherosclerosis for the patient's young age. No occlusive process or aneurysm. No veno-occlusive process. Reproductive: Uterus and bilateral adnexa are unremarkable. Other: Slightly smaller air-fluid collection at the umbilicus measuring 2.3 cm, previously 3.4 cm may represent a laparoscopic port site residual abscess. Musculoskeletal: Degenerative changes noted of the spine. Acute osseous finding. IMPRESSION: Smaller right inferior hepatic abscess status post percutaneous drain. Stable drain catheter position. No new hepatic abscess. Smaller 2.3 cm air-fluid collection at the umbilicus may represent a laparoscopic port site residual abscess. Abdominal atherosclerosis Electronically Signed   By: MJerilynn Mages  Meriel Kelliher M.D.   On: 01/04/2019 12:11   Ct Abdomen Pelvis W Contrast  Result Date: 12/25/2018 CLINICAL DATA:  Follow-up liver abscess drainage. EXAM: CT ABDOMEN AND PELVIS WITH CONTRAST TECHNIQUE: Multidetector CT imaging of the abdomen and pelvis was performed using the standard protocol following bolus administration of intravenous contrast. CONTRAST:  1073mOMNIPAQUE IOHEXOL 300 MG/ML  SOLN COMPARISON:  12/19/2018 FINDINGS: Lower chest: Small right pleural effusion without evident loculation, new. There is bilateral lower lobe atelectasis. Circumferentially thickened appearance of the esophagus which could reflect esophagitis. Hepatobiliary: Percutaneous drainage catheter into a right lower lobe liver abscess which is both fluid density and gas, still 4.3 cm in diameter. It is unclear how much of this is liquified as the abscess has an irregular rim. Small volume fluid right in for  hepatic (less than a cm) with a 8 mm calcification. Multiple appendicoliths were present on the preoperative CT. Pericholecystic edema which is likely reactive as there is no gallbladder wall distension. Probable gallstone. Pancreas: Unremarkable. Spleen: Unremarkable. Adrenals/Urinary Tract: Negative adrenals. No hydronephrosis or stone. Unremarkable bladder. Stomach/Bowel: Generalized prominent colonic wall thickness only where collapsed, likely physiologic. No pericolonic inflammation except except where there is fat edema contiguous with the liver. Appendectomy. Vascular/Lymphatic: No acute vascular abnormality. Arterial calcification. No mass or adenopathy. Reproductive:Negative Other: Gas and fluid within the abdominal wall at the level of the  umbilicus, measuring 3.5 cm. Musculoskeletal: No acute abnormalities. L4 chronic bilateral pars defects. Lower lumbar disc and facet degeneration. IMPRESSION: 1. Liver abscess with percutaneous drainage catheter in good position. The area of intrahepatic phlegmon/abscess measures up to 4.3 cm. The abscess is poorly marginated and it is unclear how much is liquified. There is persistent right subhepatic small volume fluid collection with high-density area that likely reflects dropped appendicolith. 2. 3.5 cm collection at the umbilicus, possible port site abscess. 3. Lower lobe atelectasis and new small right pleural effusion. Electronically Signed   By: Jonathon  Watts M.D.   On: 12/25/2018 12:00   Ct Abdomen Pelvis W Contrast  Addendum Date: 12/21/2018   ADDENDUM REPORT: 12/21/2018 10:40 ADDENDUM: There is an approximately 8-9 mm radiopaque stone adjacent to the inferior tip of the liver with a surrounding fluid collection. This is contiguous with intrahepatic abscess in hepatic segment 6. This stone was not present on the prior examination when the patient was diagnosed with acute appendicitis with numerous appendicoliths. This almost certainly represents a  retained appendicolith which is the source of the patient's intrahepatic abscess. These results were called by telephone at the time of interpretation on 12/21/2018 to Kelly Rayburn, PA , who verbally acknowledged these results. Electronically Signed   By: Heath  McCullough M.D.   On: 12/21/2018 10:40   Result Date: 12/21/2018 CLINICAL DATA:  Nausea, vomiting, and diarrhea. Generalized body aches for 1 day. Dialysis for 3 years. Appendectomy on 12/06/2018. EXAM: CT ABDOMEN AND PELVIS WITH CONTRAST TECHNIQUE: Multidetector CT imaging of the abdomen and pelvis was performed using the standard protocol following bolus administration of intravenous contrast. CONTRAST:  100mL OMNIPAQUE IOHEXOL 300 MG/ML  SOLN COMPARISON:  12/06/2018 FINDINGS: Lower chest: Linear atelectasis in the lung bases. Hepatobiliary: Lobulated circumscribed cystic lesions demonstrated in the inferior liver, new since previous study, suggesting hepatic abscess. Largest lesion measures about 3.7 cm diameter. Gallbladder and bile ducts are unremarkable. Pancreas: Unremarkable. No pancreatic ductal dilatation or surrounding inflammatory changes. Spleen: Normal in size without focal abnormality. Adrenals/Urinary Tract: Adrenal glands are unremarkable. Kidneys are normal, without renal calculi, focal lesion, or hydronephrosis. Bladder is unremarkable. Stomach/Bowel: Stomach, small bowel, and colon are mostly decompressed, limiting evaluation of the wall. In the right lower quadrant, there is evidence of infiltration in the right lower quadrant fat with some wall thickening suggested in the cecum and ascending colon. Minimal soft tissue edema without definite loculation. This may represent postoperative change or residual inflammatory changes from previous appendicitis. Recurrent inflammatory process could also be present. Vascular/Lymphatic: No significant vascular findings are present. No enlarged abdominal or pelvic lymph nodes. There a few scattered  prominent lymph nodes in the right lower quadrant, likely reactive. Reproductive: Uterus and bilateral adnexa are unremarkable. Other: No free air in the abdomen. Peritoneal nodule along the right lower quadrant anteriorly measuring 1.4 cm diameter, not present previously. This could represent postoperative changes a suture granuloma or postoperative inflammatory reaction. Musculoskeletal: No acute or significant osseous findings. IMPRESSION: 1. Multiloculated cystic lesions demonstrated in the inferior liver, new since previous study, suggesting hepatic abscess. 2. Postoperative and/or inflammatory stranding and edema throughout the right lower quadrant fat with associated colonic wall thickening in the cecum and ascending colon. 3. Peritoneal nodule along the right lower quadrant anteriorly may represent postoperative changes or postoperative inflammatory reaction. Electronically Signed: By: William  Stevens M.D. On: 12/19/2018 22:44   Ct Image Guided Drainage By Percutaneous Catheter  Result Date: 12/21/2018 INDICATION: 52-year-old female with a history of   gangrenous acute appendicitis status post appendectomy. Recent CT imaging demonstrates and intrahepatic abscess with a small stone adjacent to the liver. The stone was not present on prior imaging and almost certainly represents a retained appendicolith which likely represents the source of the patient's hepatic abscess. She presents for CT-guided drainage of the abscess cavity. EXAM: CT-guided drain placement MEDICATIONS: The patient is currently admitted to the hospital and receiving intravenous antibiotics. The antibiotics were administered within an appropriate time frame prior to the initiation of the procedure. Additionally, 4 mg Zofran were administered intravenously for nausea. ANESTHESIA/SEDATION: Fentanyl 1 mcg IV; Versed 50 mg IV. Moderate Sedation Time:  21 minutes The patient was continuously monitored during the procedure by the interventional  radiology nurse under my direct supervision. COMPLICATIONS: None immediate. PROCEDURE: Informed written consent was obtained from the patient after a thorough discussion of the procedural risks, benefits and alternatives. All questions were addressed. Maximal Sterile Barrier Technique was utilized including caps, mask, sterile gowns, sterile gloves, sterile drape, hand hygiene and skin antiseptic. A timeout was performed prior to the initiation of the procedure. A planning CT scan was performed. The low-attenuation collection in the inferior aspect of hepatic segment 6 is easily visualized. The small appendicoliths is again visualized. A suitable skin entry site was selected and marked. After the skin was sterilely prepped and draped in the standard fashion using chlorhexidine skin prep, local anesthesia was attained by infiltration with 1% lidocaine. A small dermatotomy was made. Under intermittent CT guidance, an 18 gauge trocar needle was advanced through the liver and into the fluid collection. A 0.035 wire was then coiled in the complex collection. The needle was removed. The skin and transhepatic tract were dilated to 10 French and a Cook 10.2 French all-purpose drainage catheter was advanced over the wire and formed. Aspiration yields approximately 20 cc of foul-smelling thick, purulent fluid. The aspirated fluid was sent for Gram stain and culture. Follow-up CT imaging demonstrates excellent position of the drainage catheter. The catheter was flushed, connected to JP bulb suction and secured to the skin with 0 Prolene suture. The patient tolerated the procedure well. IMPRESSION: Successful placement of a 10 French transhepatic drainage catheter into the inferior right hepatic abscess. Aspiration yields 20 mL of thick, foul-smelling purulent fluid. Samples were sent for culture. PLAN: 1. Maintain drain to JP bulb suction. 2. Flush drain at least once per shift. 3. Follow cultures and adjust antibiotics  accordingly. 4. Surgery aware of the presence of the retained appendicoliths. This will represent a continued nidus for infection/abscess formation until removed. Electronically Signed   By: Heath  McCullough M.D.   On: 12/21/2018 10:15   Ir Radiologist Eval & Mgmt  Result Date: 01/04/2019 Please refer to notes tab for details about interventional procedure. (Op Note)   Labs:  CBC: Recent Labs    12/23/18 0533 12/24/18 0527 12/25/18 0332 12/26/18 0637  WBC 22.5* 22.7* 21.1* 22.2*  HGB 8.7* 9.7* 9.2* 9.0*  HCT 28.1* 31.0* 30.5* 29.5*  PLT 183 335 410* 354    COAGS: Recent Labs    12/20/18 0019  INR 1.23    BMP: Recent Labs    12/22/18 0350 12/24/18 0527 12/25/18 0332 12/26/18 0637  NA 138 136 135 136  K 3.5 3.9 3.6 3.6  CL 99 96* 97* 96*  CO2 21* 21* 20* 25  GLUCOSE 169* 236* 236* 132*  BUN 46* 45* 59* 24*  CALCIUM 8.0* 8.5* 8.5* 8.2*  CREATININE 9.44* 7.84* 9.67* 5.82*  GFRNONAA   4* 5* 4* 8*  GFRAA 5* 6* 5* 9*    LIVER FUNCTION TESTS: Recent Labs    12/06/18 1808 12/08/18 0723 12/19/18 1932 12/20/18 0630 12/21/18 0714  BILITOT 0.4  --  1.3* 0.8 1.4*  AST 15  --  47* 78* 77*  ALT 12  --  25 37 39  ALKPHOS 70  --  115 95 87  PROT 7.4  --  8.2* 6.6 6.2*  ALBUMIN 3.5 2.5* 2.8* 2.2* 2.0*    Assessment and Plan:  Follow-up CT today demonstrates a smaller improving right hepatic abscess compared to the prior study.  Stable drain catheter position.  Injection of the drain demonstrates no evidence of fistula to the biliary tree.  Plan: Continue daily flushing and external suctionable. Repeat CT abdomen in 2 weeks with a possible drain injection.   Electronically Signed: [name] Shick 01/04/2019, 1:05 PM   I spent a total of    15 Minutes in face to face in clinical consultation, greater than 50% of which was counseling/coordinating care for this patient with a right hepatic abscess drain  

## 2019-01-05 ENCOUNTER — Encounter: Payer: Self-pay | Admitting: Gastroenterology

## 2019-01-05 DIAGNOSIS — D631 Anemia in chronic kidney disease: Secondary | ICD-10-CM | POA: Diagnosis not present

## 2019-01-05 DIAGNOSIS — E1122 Type 2 diabetes mellitus with diabetic chronic kidney disease: Secondary | ICD-10-CM | POA: Diagnosis not present

## 2019-01-05 DIAGNOSIS — N186 End stage renal disease: Secondary | ICD-10-CM | POA: Diagnosis not present

## 2019-01-05 DIAGNOSIS — N2581 Secondary hyperparathyroidism of renal origin: Secondary | ICD-10-CM | POA: Diagnosis not present

## 2019-01-05 DIAGNOSIS — E876 Hypokalemia: Secondary | ICD-10-CM | POA: Diagnosis not present

## 2019-01-05 DIAGNOSIS — A419 Sepsis, unspecified organism: Secondary | ICD-10-CM | POA: Diagnosis not present

## 2019-01-06 DIAGNOSIS — N186 End stage renal disease: Secondary | ICD-10-CM | POA: Diagnosis not present

## 2019-01-06 DIAGNOSIS — Z992 Dependence on renal dialysis: Secondary | ICD-10-CM | POA: Diagnosis not present

## 2019-01-06 DIAGNOSIS — E1122 Type 2 diabetes mellitus with diabetic chronic kidney disease: Secondary | ICD-10-CM | POA: Diagnosis not present

## 2019-01-08 DIAGNOSIS — E1122 Type 2 diabetes mellitus with diabetic chronic kidney disease: Secondary | ICD-10-CM | POA: Diagnosis not present

## 2019-01-08 DIAGNOSIS — E876 Hypokalemia: Secondary | ICD-10-CM | POA: Diagnosis not present

## 2019-01-08 DIAGNOSIS — D631 Anemia in chronic kidney disease: Secondary | ICD-10-CM | POA: Diagnosis not present

## 2019-01-08 DIAGNOSIS — N186 End stage renal disease: Secondary | ICD-10-CM | POA: Diagnosis not present

## 2019-01-08 DIAGNOSIS — N2581 Secondary hyperparathyroidism of renal origin: Secondary | ICD-10-CM | POA: Diagnosis not present

## 2019-01-08 DIAGNOSIS — A419 Sepsis, unspecified organism: Secondary | ICD-10-CM | POA: Diagnosis not present

## 2019-01-10 DIAGNOSIS — A419 Sepsis, unspecified organism: Secondary | ICD-10-CM | POA: Diagnosis not present

## 2019-01-10 DIAGNOSIS — N186 End stage renal disease: Secondary | ICD-10-CM | POA: Diagnosis not present

## 2019-01-10 DIAGNOSIS — N2581 Secondary hyperparathyroidism of renal origin: Secondary | ICD-10-CM | POA: Diagnosis not present

## 2019-01-10 DIAGNOSIS — E1122 Type 2 diabetes mellitus with diabetic chronic kidney disease: Secondary | ICD-10-CM | POA: Diagnosis not present

## 2019-01-10 DIAGNOSIS — D631 Anemia in chronic kidney disease: Secondary | ICD-10-CM | POA: Diagnosis not present

## 2019-01-10 DIAGNOSIS — E876 Hypokalemia: Secondary | ICD-10-CM | POA: Diagnosis not present

## 2019-01-11 ENCOUNTER — Other Ambulatory Visit: Payer: Self-pay | Admitting: *Deleted

## 2019-01-11 NOTE — Patient Outreach (Signed)
[facility]) Care Management  01/11/2019  [name] 03-Jan-1966 [id number]   No response from patient outreach attempts will proceed with case closure.     Objective:Per KPN (Knowledge Performance Now, point of care tool) and chart review,patient hospitalized 12/19/2018 -12/26/2018 for hepatic abscess. Patient hospitalized 12/06/2018 - 12/09/2018 for acutegangrenousappendicitisand status postlaparoscopic appendectomyon 12/06/2018. Patient also has a history of ESRD on hemodialysis, hypertension, diabetes, sepsis, blindness, and right BKA.      Assessment: Received Medicare EMMI General Discharge follow up referral on 12/29/2018. Red flag alert, Day #1, patient answered no to the following question: Scheduled follow-up?EMMI follow up not completed due to unable to contact patient and will proceed with case closure.      Plan:Case closure due to unable to reach.      [name] H. Annia Friendly, BSN, [place] Management [facility] Telephonic CM Phone: [phone] Fax: [phone]

## 2019-01-13 DIAGNOSIS — N186 End stage renal disease: Secondary | ICD-10-CM | POA: Diagnosis not present

## 2019-01-13 DIAGNOSIS — N2581 Secondary hyperparathyroidism of renal origin: Secondary | ICD-10-CM | POA: Diagnosis not present

## 2019-01-13 DIAGNOSIS — A419 Sepsis, unspecified organism: Secondary | ICD-10-CM | POA: Diagnosis not present

## 2019-01-13 DIAGNOSIS — E876 Hypokalemia: Secondary | ICD-10-CM | POA: Diagnosis not present

## 2019-01-13 DIAGNOSIS — D631 Anemia in chronic kidney disease: Secondary | ICD-10-CM | POA: Diagnosis not present

## 2019-01-13 DIAGNOSIS — E1122 Type 2 diabetes mellitus with diabetic chronic kidney disease: Secondary | ICD-10-CM | POA: Diagnosis not present

## 2019-01-15 DIAGNOSIS — N186 End stage renal disease: Secondary | ICD-10-CM | POA: Diagnosis not present

## 2019-01-15 DIAGNOSIS — A419 Sepsis, unspecified organism: Secondary | ICD-10-CM | POA: Diagnosis not present

## 2019-01-15 DIAGNOSIS — E1122 Type 2 diabetes mellitus with diabetic chronic kidney disease: Secondary | ICD-10-CM | POA: Diagnosis not present

## 2019-01-15 DIAGNOSIS — N2581 Secondary hyperparathyroidism of renal origin: Secondary | ICD-10-CM | POA: Diagnosis not present

## 2019-01-15 DIAGNOSIS — E876 Hypokalemia: Secondary | ICD-10-CM | POA: Diagnosis not present

## 2019-01-15 DIAGNOSIS — D631 Anemia in chronic kidney disease: Secondary | ICD-10-CM | POA: Diagnosis not present

## 2019-01-17 ENCOUNTER — Other Ambulatory Visit: Payer: Medicare Other

## 2019-01-17 DIAGNOSIS — N2581 Secondary hyperparathyroidism of renal origin: Secondary | ICD-10-CM | POA: Diagnosis not present

## 2019-01-17 DIAGNOSIS — E1122 Type 2 diabetes mellitus with diabetic chronic kidney disease: Secondary | ICD-10-CM | POA: Diagnosis not present

## 2019-01-17 DIAGNOSIS — D631 Anemia in chronic kidney disease: Secondary | ICD-10-CM | POA: Diagnosis not present

## 2019-01-17 DIAGNOSIS — E876 Hypokalemia: Secondary | ICD-10-CM | POA: Diagnosis not present

## 2019-01-17 DIAGNOSIS — A419 Sepsis, unspecified organism: Secondary | ICD-10-CM | POA: Diagnosis not present

## 2019-01-17 DIAGNOSIS — N186 End stage renal disease: Secondary | ICD-10-CM | POA: Diagnosis not present

## 2019-01-18 ENCOUNTER — Telehealth: Payer: Self-pay | Admitting: *Deleted

## 2019-01-18 NOTE — Telephone Encounter (Signed)
Patient was told by her home health nurse that she would need to call her PCP to get some saline flushes sent to her pharmacy.  Will forward to MD. [place], [name], [place]

## 2019-01-19 ENCOUNTER — Other Ambulatory Visit: Payer: Self-pay | Admitting: Family Medicine

## 2019-01-19 DIAGNOSIS — E1122 Type 2 diabetes mellitus with diabetic chronic kidney disease: Secondary | ICD-10-CM | POA: Diagnosis not present

## 2019-01-19 DIAGNOSIS — A419 Sepsis, unspecified organism: Secondary | ICD-10-CM | POA: Diagnosis not present

## 2019-01-19 DIAGNOSIS — E876 Hypokalemia: Secondary | ICD-10-CM | POA: Diagnosis not present

## 2019-01-19 DIAGNOSIS — N186 End stage renal disease: Secondary | ICD-10-CM | POA: Diagnosis not present

## 2019-01-19 DIAGNOSIS — N2581 Secondary hyperparathyroidism of renal origin: Secondary | ICD-10-CM | POA: Diagnosis not present

## 2019-01-19 DIAGNOSIS — D631 Anemia in chronic kidney disease: Secondary | ICD-10-CM | POA: Diagnosis not present

## 2019-01-19 MED ORDER — SALINE FLUSH 0.9 % IV SOLN: 5.0000 mL | Freq: Three times a day (TID) | INTRAVENOUS | 0 refills | Status: DC

## 2019-01-19 NOTE — Telephone Encounter (Signed)
Yes a script will need to be sent for this.  Jazmin Hartsell,CMA

## 2019-01-19 NOTE — Telephone Encounter (Signed)
Ok to send a verbal. If I need to send a Rx for this, let me know.

## 2019-01-19 NOTE — Telephone Encounter (Signed)
Rx sent to [facility]

## 2019-01-22 DIAGNOSIS — E876 Hypokalemia: Secondary | ICD-10-CM | POA: Diagnosis not present

## 2019-01-22 DIAGNOSIS — N2581 Secondary hyperparathyroidism of renal origin: Secondary | ICD-10-CM | POA: Diagnosis not present

## 2019-01-22 DIAGNOSIS — E1122 Type 2 diabetes mellitus with diabetic chronic kidney disease: Secondary | ICD-10-CM | POA: Diagnosis not present

## 2019-01-22 DIAGNOSIS — D631 Anemia in chronic kidney disease: Secondary | ICD-10-CM | POA: Diagnosis not present

## 2019-01-22 DIAGNOSIS — N186 End stage renal disease: Secondary | ICD-10-CM | POA: Diagnosis not present

## 2019-01-22 DIAGNOSIS — A419 Sepsis, unspecified organism: Secondary | ICD-10-CM | POA: Diagnosis not present

## 2019-01-22 NOTE — Telephone Encounter (Signed)
LVM. Inforing pt that her saline was sent to her [facility] pharm, and that she can go pick it up at anytime. Salvatore Marvel, CMA

## 2019-01-23 ENCOUNTER — Other Ambulatory Visit: Payer: Medicare Other

## 2019-01-23 ENCOUNTER — Inpatient Hospital Stay: Admission: RE | Admit: 2019-01-23 | Payer: Medicare Other | Source: Ambulatory Visit

## 2019-01-23 NOTE — Progress Notes (Deleted)
Patient: [name]  DOB: September 03, 1966 MRN: [id number] PCP: Rory Percy, DO    Patient Active Problem List   Diagnosis Date Noted  . Non-intractable vomiting   . Enterococcal bacteremia   . ESRD on dialysis ([place])   . Moderate protein-calorie malnutrition ([place])   . Liver abscess 12/20/2018  . Coffee ground emesis   . Diarrhea   . Sepsis without acute organ dysfunction ([place])   . Diabetes mellitus due to underlying condition with chronic kidney disease on chronic dialysis ([place])   . Acute right lower quadrant pain   . Appendicitis 12/06/2018  . Chronic cough 10/26/2017  . Healthcare maintenance 05/24/2017  . Diabetic peripheral neuropathy ([place]) 03/17/2017  . Hyperlipidemia 03/17/2017  . S/P BKA (below knee amputation) unilateral, right ([address]) 01/04/2017  . ESRD (end stage renal disease) ([place]) 01/04/2017  . History of alcohol abuse 01/04/2017  . Legally blind 01/04/2017  . Type 2 diabetes mellitus ([place]) 09/01/2011  . Hypertension 09/01/2011  . Anemia      Subjective:  CC:  Hospitalization follow up for liver abscess.   HPI: MACK THURMON is a 53 y.o. female with history of T2DM, former ETOH abuse, ESRD on HD via AVF, HTN, Anemia.   She required admission to [facility] in January 2020 for nausea, vomiting and diarrhea after HD with chills. Previously on January 1st she required laparoscopic appendectomy for gangrenous appendicitis. CT abdomen and pelvis revealed multiloculated cystic structure concerning for abscess in the liver with appendicoliths. Blood cultures revealed enterobacter and klebsiella pneumoniae --> narrowed to Ceftriaxone + Metronidazole PO. IR placed a drain into the abscess which grew Enterobacter. She was seen by Dr. Tommy Medal and recommended 6 weeks of Ceftazidime with HD planned through March 3rd. Last scan was on January 30th which revealed a smaller inferior hepatic abscess with stable catheter position. 2.3 cm air-fluid collection at the  umbilicus reflecting possible abscess from laparascopic port site. She is also scheduled for CT scan in IR clinic 01/2019. Drain output has been ***.   ROS  Past Medical History:  Diagnosis Date  . Alcohol dependency ([place])   . Anemia   . Chronic kidney disease   . Diabetes mellitus    type 2  . Diabetes mellitus type 2, uncontrolled ([place])   . Hypertension   . Legally blind in left eye, as defined in [place]   . Necrotizing fasciitis ([place])   . PONV (postoperative nausea and vomiting)     Outpatient Medications Prior to Visit  Medication Sig Dispense Refill  . amLODipine (NORVASC) 10 MG tablet TAKE 1 TABLET BY MOUTH ONCE DAILY (Patient taking differently: 10 mg daily. ) 90 tablet 2  . atorvastatin (LIPITOR) 40 MG tablet TAKE 1 TABLET BY MOUTH ONCE DAILY (Patient taking differently: Take 40 mg by mouth daily at 6 PM. ) 90 tablet 2  . B Complex-C-Folic Acid (DIALYVITE 536) 0.8 MG TABS Take 1 tablet by mouth daily.  11  . Blood Glucose Monitoring Suppl (PRODIGY AUTOCODE BLOOD GLUCOSE) w/Device KIT 1 Device by Does not apply route 3 (three) times daily. 1 each 0  . cefTAZidime (FORTAZ) IVPB Inject 2 g into the vein every Monday, Wednesday, and Friday with hemodialysis. Indication:  Liver abcess and polymicrobial bacteremia Last Day of Therapy:  February 06, 2019 Labs - Once weekly:  CBC/D and BMP, Labs - Every other week:  ESR and CRP 19 Units 0  . gabapentin (NEURONTIN) 100 MG capsule TAKE 1 CAPSULE BY  MOUTH ONCE DAILY AS NEEDED (Patient taking differently: Take 100 mg by mouth daily. ) 90 capsule 0  . glucose blood test strip Use as instructed 100 each 12  . insulin detemir (LEVEMIR) 100 UNIT/ML injection Inject 0.1 mLs (10 Units total) into the skin daily. 10 mL 11  . Insulin Pen Needle (EASY TOUCH PEN NEEDLES) 32G X 4 MM MISC 1 each by Does not apply route 3 (three) times daily. 100 each 11  . Lancet Devices (ONE TOUCH DELICA LANCING DEV) MISC Check blood sugars once daily 1 each 0  . losartan  (COZAAR) 50 MG tablet Take 1 tablet (50 mg total) by mouth daily. 90 tablet 3  . metroNIDAZOLE (FLAGYL) 500 MG tablet Take 1 tablet (500 mg total) by mouth every 8 (eight) hours. 90 tablet 1  . mupirocin cream (BACTROBAN) 2 % Apply 1 application topically 2 (two) times daily. 15 g 0  . pantoprazole (PROTONIX) 40 MG tablet Take 1 tablet (40 mg total) by mouth daily for 30 days. 30 tablet 0  . PRODIGY LANCETS 28G MISC 1 each by Does not apply route 3 (three) times daily. 100 each 3  . promethazine (PHENERGAN) 12.5 MG tablet Take 1 tablet (12.5 mg total) by mouth every 6 (six) hours as needed for up to 10 doses for nausea or vomiting. 10 tablet 0  . Sodium Chloride Flush (SALINE FLUSH) 0.9 % SOLN Inject 5 mLs into the vein 3 (three) times daily. 500 mL 0   No facility-administered medications prior to visit.      No Known Allergies  Social History   Tobacco Use  . Smoking status: Never Smoker  . Smokeless tobacco: Never Used  Substance Use Topics  . Alcohol use: Yes    Alcohol/week: 1.0 standard drinks    Types: 1 Cans of beer per week    Comment: h/o alcohol abuse 4 years ago (07/21/15)  . Drug use: Yes    Types: Cocaine    Comment:  last used cocaine 4 years ago (07/21/15)    Family History  Problem Relation Age of Onset  . Diabetes Mother   . Alcohol abuse Mother   . Diabetes Father   . Alzheimer's disease Father   . Hypertension Father   . Kidney failure Sister   . Diabetes Sister   . High blood pressure Sister     Objective:  There were no vitals filed for this visit. There is no height or weight on file to calculate BMI.  Physical Exam  Lab Results: Lab Results  Component Value Date   WBC 22.2 (H) 12/26/2018   HGB 9.0 (L) 12/26/2018   HCT 29.5 (L) 12/26/2018   MCV 73.6 (L) 12/26/2018   PLT 354 12/26/2018    Lab Results  Component Value Date   CREATININE 5.82 (H) 12/26/2018   BUN 24 (H) 12/26/2018   NA 136 12/26/2018   K 3.6 12/26/2018   CL 96 (L)  12/26/2018   CO2 25 12/26/2018    Lab Results  Component Value Date   ALT 39 12/21/2018   AST 77 (H) 12/21/2018   ALKPHOS 87 12/21/2018   BILITOT 1.4 (H) 12/21/2018     Assessment & Plan:   Problem List Items Addressed This Visit    None      *** will return to clinic in *** {weeks/months} for follow up  Janene Madeira, MSN, NP-C [facility] for [place] Pager: [phone] Office: [phone]  01/23/19  10:42 PM

## 2019-01-24 ENCOUNTER — Other Ambulatory Visit (HOSPITAL_COMMUNITY): Payer: Self-pay | Admitting: Family Medicine

## 2019-01-24 ENCOUNTER — Inpatient Hospital Stay: Payer: Medicare Other | Admitting: Infectious Diseases

## 2019-01-24 DIAGNOSIS — N186 End stage renal disease: Secondary | ICD-10-CM | POA: Diagnosis not present

## 2019-01-24 DIAGNOSIS — D631 Anemia in chronic kidney disease: Secondary | ICD-10-CM | POA: Diagnosis not present

## 2019-01-24 DIAGNOSIS — N2581 Secondary hyperparathyroidism of renal origin: Secondary | ICD-10-CM | POA: Diagnosis not present

## 2019-01-24 DIAGNOSIS — A419 Sepsis, unspecified organism: Secondary | ICD-10-CM | POA: Diagnosis not present

## 2019-01-24 DIAGNOSIS — E1122 Type 2 diabetes mellitus with diabetic chronic kidney disease: Secondary | ICD-10-CM | POA: Diagnosis not present

## 2019-01-24 DIAGNOSIS — E876 Hypokalemia: Secondary | ICD-10-CM | POA: Diagnosis not present

## 2019-01-25 ENCOUNTER — Ambulatory Visit
Admission: RE | Admit: 2019-01-25 | Discharge: 2019-01-25 | Disposition: A | Discharge status: Home / Self Care | Payer: Medicare Other | Source: Ambulatory Visit | Attending: General Surgery | Admitting: General Surgery

## 2019-01-25 ENCOUNTER — Other Ambulatory Visit: Payer: Self-pay | Admitting: General Surgery

## 2019-01-25 DIAGNOSIS — K750 Abscess of liver: Secondary | ICD-10-CM

## 2019-01-25 DIAGNOSIS — Z4689 Encounter for fitting and adjustment of other specified devices: Secondary | ICD-10-CM | POA: Diagnosis not present

## 2019-01-25 HISTORY — PX: IR RADIOLOGIST EVAL & MGMT: IMG5224

## 2019-01-25 NOTE — Progress Notes (Signed)
Chief Complaint: Patient was seen in consultation today for liver abscess drain follow up at the request of Mickeal Skinner  Referring Physician(s): Mickeal Skinner  Supervising Physician: Aletta Edouard  History of Present Illness: [name] is a 53 y.o. female with past medical history significant for ETOH dependence, DM, CKD, anemia, HTN and liver abscess s/p drain placement 12/21/18 by Dr. Laurence Ferrari who presents today for routine follow up. [name] was last seen in IR clinic on 01/04/19 by Dr. Annamaria Boots - CT at that visit showed a small improvement in the right hepatic abscess without fistula identified. It was recommended at that time to continue daily flushes and to follow up with IR clinic for repeat CT and possible drain injection.   Patient reports [name] has been feeling ok, has been attending dialysis regularly and taking her medications as prescribed. [name] states minimal output from her drain - abdominal pain, appetite is ok, no change in bowel movements. [name] denies any discharge from her drain insertion site but states "I try not to look at it because it's gross."    Past Medical History:  Diagnosis Date  . Alcohol dependency ([place])   . Anemia   . Chronic kidney disease   . Diabetes mellitus    type 2  . Diabetes mellitus type 2, uncontrolled ([place])   . Hypertension   . Legally blind in left eye, as defined in [place]   . Necrotizing fasciitis ([place])   . PONV (postoperative nausea and vomiting)     Past Surgical History:  Procedure Laterality Date  . AMPUTATION     right first and left middle finger amputation 2/2 OM 08/2011 by Dr. Amedeo Plenty  . AMPUTATION  01/18/2012   Procedure: AMPUTATION DIGIT;  Surgeon: Tennis Must, MD;  Location: [place];  Service: Orthopedics;  Laterality: Left;  revision amputation left thumb  . AMPUTATION  08/25/2012   Procedure: AMPUTATION BELOW KNEE;  Surgeon: Newt Minion, MD;  Location: [place];  Service: Orthopedics;   Laterality: Right;  Right Below Knee Amputation  . BASCILIC VEIN TRANSPOSITION Left 07/22/2015   Procedure: BASILIC VEIN TRANSPOSITION ;  Surgeon: Elam Dutch, MD;  Location: [place];  Service: Vascular;  Laterality: Left;  . CATARACT EXTRACTION     right   . CESAREAN SECTION  1986  . CESAREAN SECTION  1985  . CESAREAN SECTION  1991  . I&D EXTREMITY  12/24/2011   Procedure: IRRIGATION AND DEBRIDEMENT EXTREMITY;  Surgeon: Tennis Must, MD;  Location: [place];  Service: Orthopedics;  Laterality: Left;  I&Dleft thumb with partial amputation  . I&D EXTREMITY  12/26/2011   Procedure: IRRIGATION AND DEBRIDEMENT EXTREMITY;  Surgeon: Tennis Must, MD;  Location: [place];  Service: Orthopedics;  Laterality: Left;  . I&D EXTREMITY  12/23/2011   Procedure: IRRIGATION AND DEBRIDEMENT EXTREMITY;  Surgeon: Tennis Must, MD;  Location: [place];  Service: Orthopedics;  Laterality: Left;  I&D Left thumb, hand and arm  . I&D left thumb  12/23/2011  . IR RADIOLOGIST EVAL & MGMT  01/04/2019  . LAPAROSCOPIC APPENDECTOMY N/A 12/06/2018   Procedure: APPENDECTOMY LAPAROSCOPIC;  Surgeon: Kinsinger, Arta Bruce, MD;  Location: [place];  Service: General;  Laterality: N/A;  . TUBAL LIGATION      Allergies: Patient has no known allergies.  Medications: Prior to Admission medications   Medication Sig Start Date End Date Taking? Authorizing Provider  amLODipine (NORVASC) 10 MG tablet TAKE 1 TABLET BY MOUTH ONCE  DAILY Patient taking differently: 10 mg daily.  05/11/18   Rory Percy, DO  atorvastatin (LIPITOR) 40 MG tablet TAKE 1 TABLET BY MOUTH ONCE DAILY Patient taking differently: Take 40 mg by mouth daily at 6 PM.  05/11/18   Rumball, Bryson Ha, DO  B Complex-C-Folic Acid (DIALYVITE 643) 0.8 MG TABS Take 1 tablet by mouth daily. 10/09/18   [provider]  Blood Glucose Monitoring Suppl (PRODIGY AUTOCODE BLOOD GLUCOSE) w/Device KIT 1 Device by Does not apply route 3 (three) times daily. 10/26/18   Rory Percy, DO    cefTAZidime (FORTAZ) IVPB Inject 2 g into the vein every Monday, Wednesday, and Friday with hemodialysis. Indication:  Liver abcess and polymicrobial bacteremia Last Day of Therapy:  February 06, 2019 Labs - Once weekly:  CBC/D and BMP, Labs - Every other week:  ESR and CRP 12/26/18 02/08/19  Benay Pike, MD  gabapentin (NEURONTIN) 100 MG capsule TAKE 1 CAPSULE BY MOUTH ONCE DAILY AS NEEDED Patient taking differently: Take 100 mg by mouth daily.  11/01/18   Rory Percy, DO  glucose blood test strip Use as instructed 10/26/18   Rory Percy, DO  insulin detemir (LEVEMIR) 100 UNIT/ML injection Inject 0.1 mLs (10 Units total) into the skin daily. 12/27/18   Benay Pike, MD  Insulin Pen Needle (EASY TOUCH PEN NEEDLES) 32G X 4 MM MISC 1 each by Does not apply route 3 (three) times daily. 08/10/18   Rory Percy, DO  Lancet Devices (ONE TOUCH DELICA LANCING DEV) MISC Check blood sugars once daily 05/25/18   Mayo, Pete Pelt, MD  losartan (COZAAR) 50 MG tablet Take 1 tablet (50 mg total) by mouth daily. 08/10/18   Rory Percy, DO  metroNIDAZOLE (FLAGYL) 500 MG tablet Take 1 tablet (500 mg total) by mouth every 8 (eight) hours. 12/26/18 02/08/19  Benay Pike, MD  mupirocin cream (BACTROBAN) 2 % Apply 1 application topically 2 (two) times daily. 10/26/17   Mayo, Pete Pelt, MD  pantoprazole (PROTONIX) 40 MG tablet Take 1 tablet (40 mg total) by mouth daily for 30 days. 12/27/18 01/26/19  Benay Pike, MD  PRODIGY LANCETS 28G MISC 1 each by Does not apply route 3 (three) times daily. 10/26/18   Rory Percy, DO  promethazine (PHENERGAN) 12.5 MG tablet Take 1 tablet (12.5 mg total) by mouth every 6 (six) hours as needed for up to 10 doses for nausea or vomiting. 12/26/18   Benay Pike, MD  Sodium Chloride Flush (SALINE FLUSH) 0.9 % SOLN Inject 5 mLs into the vein 3 (three) times daily. 01/19/19   Rory Percy, DO     Family History  Problem Relation Age of Onset  . Diabetes Mother   .  Alcohol abuse Mother   . Diabetes Father   . Alzheimer's disease Father   . Hypertension Father   . Kidney failure Sister   . Diabetes Sister   . High blood pressure Sister     Social History   Socioeconomic History  . Marital status: Single    Spouse name: Not on file  . Number of children: 3  . Years of education: Not on file  . Highest education level: Not on file  Occupational History    Employer: [employer]  Social Needs  . Financial resource strain: Not on file  . Food insecurity:    Worry: Not on file    Inability: Not on file  . Transportation needs:    Medical: Not on file  Non-medical: Not on file  Tobacco Use  . Smoking status: Never Smoker  . Smokeless tobacco: Never Used  Substance and Sexual Activity  . Alcohol use: Yes    Alcohol/week: 1.0 standard drinks    Types: 1 Cans of beer per week    Comment: h/o alcohol abuse 4 years ago (07/21/15)  . Drug use: Yes    Types: Cocaine    Comment:  last used cocaine 4 years ago (07/21/15)  . Sexual activity: Yes  Lifestyle  . Physical activity:    Days per week: Not on file    Minutes per session: Not on file  . Stress: Not on file  Relationships  . Social connections:    Talks on phone: Not on file    Gets together: Not on file    Attends religious service: Not on file    Active member of club or organization: Not on file    Attends meetings of clubs or organizations: Not on file    Relationship status: Not on file  Other Topics Concern  . Not on file  Social History Narrative          Review of Systems: A 12 point ROS discussed and pertinent positives are indicated in the HPI above.  All other systems are negative.  Review of Systems  Constitutional: Negative for appetite change, chills and fever.  Respiratory: Negative for shortness of breath.   Cardiovascular: Negative for chest pain.  Gastrointestinal: Negative for abdominal pain, diarrhea, nausea and vomiting.  Skin: Negative for color change.     Vital Signs: There were no vitals taken for this visit.  Physical Exam Vitals signs reviewed.  Constitutional:      General: [name] is not in acute distress.    Appearance: [name] is ill-appearing.     Comments: Appears older than stated age, pleasant, talkative, poor historian regarding her drain care.  Pulmonary:     Effort: Pulmonary effort is normal.  Abdominal:     General: There is no distension.     Palpations: Abdomen is soft.     Tenderness: There is no abdominal tenderness.     Comments: (+) RUQ drain to JP, scant serous output present. Numerous sticky areas on abdomen where previous bandages were. Insertion site is clean, dry and intact - there is an area of light brown crust around the insertion site, this was removed easily without bleeding or discharge noted. Suture is intact.   Skin:    General: Skin is warm and dry.     Coloration: Skin is not jaundiced.  Neurological:     Mental Status: [name] is alert. Mental status is at baseline.      Imaging: Ct Abdomen Pelvis W Contrast  Result Date: 01/04/2019 CLINICAL DATA:  Hepatic abscess, status post percutaneous drain 12/21/2018, outpatient follow-up EXAM: CT ABDOMEN AND PELVIS WITH CONTRAST TECHNIQUE: Multidetector CT imaging of the abdomen and pelvis was performed using the standard protocol following bolus administration of intravenous contrast. CONTRAST:  164m ISOVUE-300 IOPAMIDOL (ISOVUE-300) INJECTION 61% COMPARISON:  12/21/2018, 12/25/2018 FINDINGS: Lower chest: Minor basilar atelectasis or scarring Hepatobiliary: Right inferior hepatic abscess drain is stable in position. The hepatic air-fluid collection at the drain catheter site is smaller 2.4 cm, previously 4 3 cm. No other hepatic abnormality. Gallbladder and biliary system. Common bile duct nondilated. Pancreas: Unremarkable. No pancreatic ductal dilatation or surrounding inflammatory changes. Spleen: Normal in size without focal abnormality. Adrenals/Urinary Tract:  Adrenal glands are unremarkable. Kidneys are normal, without renal  calculi, focal lesion, or hydronephrosis. Bladder is unremarkable. Stomach/Bowel: Negative for bowel obstruction, significant dilatation, ileus, free air. No other fluid collection, abscess, hemorrhage, hematoma ascites. Moderate colonic stool burden. Appendix not visualized Vascular/Lymphatic: Extensive abdominal atherosclerosis for the patient's young age. No occlusive process or aneurysm. No veno-occlusive process. Reproductive: Uterus and bilateral adnexa are unremarkable. Other: Slightly smaller air-fluid collection at the umbilicus measuring 2.3 cm, previously 3.4 cm may represent a laparoscopic port site residual abscess. Musculoskeletal: Degenerative changes noted of the spine. Acute osseous finding. IMPRESSION: Smaller right inferior hepatic abscess status post percutaneous drain. Stable drain catheter position. No new hepatic abscess. Smaller 2.3 cm air-fluid collection at the umbilicus may represent a laparoscopic port site residual abscess. Abdominal atherosclerosis Electronically Signed   By: Jerilynn Mages.  Shick M.D.   On: 01/04/2019 12:11   Dg Sinus/fist Tube Chk-non Gi  Result Date: 01/04/2019 INDICATION: Liver abscess, status post percutaneous drain EXAM: FLUOROSCOPIC INJECTION OF THE LIVER ABSCESS DRAIN MEDICATIONS: The patient is currently admitted to the hospital and receiving intravenous antibiotics. The antibiotics were administered within an appropriate time frame prior to the initiation of the procedure. ANESTHESIA/SEDATION: None. COMPLICATIONS: None immediate. PROCEDURE: Existing right upper quadrant abscess drain was injected with contrast. Fluoroscopic imaging performed. This was compared with the CT from earlier today. Drain catheter stable in position. Small irregular collapsed abscess cavity noted. No fistula to adjacent vascular structures or bile duct. Syringe aspiration partially collapsed cavity. IMPRESSION: Negative for  fistula to the biliary tree. Electronically Signed   By: Jerilynn Mages.  Shick M.D.   On: 01/04/2019 13:03   Ir Radiologist Eval & Mgmt  Result Date: 01/04/2019 Please refer to notes tab for details about interventional procedure. (Op Note)   Labs:  CBC: Recent Labs    12/23/18 0533 12/24/18 0527 12/25/18 0332 12/26/18 0637  WBC 22.5* 22.7* 21.1* 22.2*  HGB 8.7* 9.7* 9.2* 9.0*  HCT 28.1* 31.0* 30.5* 29.5*  PLT 183 335 410* 354    COAGS: Recent Labs    12/20/18 0019  INR 1.23    BMP: Recent Labs    12/22/18 0350 12/24/18 0527 12/25/18 0332 12/26/18 0637  NA 138 136 135 136  K 3.5 3.9 3.6 3.6  CL 99 96* 97* 96*  CO2 21* 21* 20* 25  GLUCOSE 169* 236* 236* 132*  BUN 46* 45* 59* 24*  CALCIUM 8.0* 8.5* 8.5* 8.2*  CREATININE 9.44* 7.84* 9.67* 5.82*  GFRNONAA 4* 5* 4* 8*  GFRAA 5* 6* 5* 9*    LIVER FUNCTION TESTS: Recent Labs    12/06/18 1808 12/08/18 0723 12/19/18 1932 12/20/18 0630 12/21/18 0714  BILITOT 0.4  --  1.3* 0.8 1.4*  AST 15  --  47* 78* 77*  ALT 12  --  25 37 39  ALKPHOS 70  --  115 95 87  PROT 7.4  --  8.2* 6.6 6.2*  ALBUMIN 3.5 2.5* 2.8* 2.2* 2.0*    TUMOR MARKERS: No results for input(s): AFPTM, CEA, CA199, CHROMGRNA in the last 8760 hours.  Assessment:  Patient with history of liver abscess s/p drain placement 12/21/18 by Dr. Laurence Ferrari who presents today for routine follow up. At last visit with Dr. Annamaria Boots on 01/04/19 imaging showed a small improvement in abscess without fistulous connection.   Repeat CT scan at today's visit shows resolution of previous liver abscess. Images and patient were reviewed with Dr. Kathlene Cote who recommends removal of abscess drain, injection prior to removal is not necessary based on patient report and images. Drain  was successfully removed in tact by this writer without complication - area was cleaned and dressed prior to discharge home. Instructed patient to keep area clean, dry and dressed until healed. [name] was  encouraged to call with any further questions or concerns regarding the drain site.   Electronically Signed: Joaquim Nam PA-C 01/25/2019, 9:37 AM   Please refer to Dr. Margaretmary Dys attestation of this note for management and plan.

## 2019-01-26 ENCOUNTER — Encounter: Payer: Self-pay | Admitting: *Deleted

## 2019-01-26 DIAGNOSIS — N2581 Secondary hyperparathyroidism of renal origin: Secondary | ICD-10-CM | POA: Diagnosis not present

## 2019-01-26 DIAGNOSIS — E876 Hypokalemia: Secondary | ICD-10-CM | POA: Diagnosis not present

## 2019-01-26 DIAGNOSIS — D631 Anemia in chronic kidney disease: Secondary | ICD-10-CM | POA: Diagnosis not present

## 2019-01-26 DIAGNOSIS — N186 End stage renal disease: Secondary | ICD-10-CM | POA: Diagnosis not present

## 2019-01-26 DIAGNOSIS — E1122 Type 2 diabetes mellitus with diabetic chronic kidney disease: Secondary | ICD-10-CM | POA: Diagnosis not present

## 2019-01-26 DIAGNOSIS — A419 Sepsis, unspecified organism: Secondary | ICD-10-CM | POA: Diagnosis not present

## 2019-01-29 DIAGNOSIS — D631 Anemia in chronic kidney disease: Secondary | ICD-10-CM | POA: Diagnosis not present

## 2019-01-29 DIAGNOSIS — N2581 Secondary hyperparathyroidism of renal origin: Secondary | ICD-10-CM | POA: Diagnosis not present

## 2019-01-29 DIAGNOSIS — N186 End stage renal disease: Secondary | ICD-10-CM | POA: Diagnosis not present

## 2019-01-29 DIAGNOSIS — A419 Sepsis, unspecified organism: Secondary | ICD-10-CM | POA: Diagnosis not present

## 2019-01-29 DIAGNOSIS — E1122 Type 2 diabetes mellitus with diabetic chronic kidney disease: Secondary | ICD-10-CM | POA: Diagnosis not present

## 2019-01-29 DIAGNOSIS — E876 Hypokalemia: Secondary | ICD-10-CM | POA: Diagnosis not present

## 2019-01-30 ENCOUNTER — Telehealth: Payer: Self-pay | Admitting: *Deleted

## 2019-01-30 NOTE — Telephone Encounter (Signed)
Pt no showed her Pre visit this morning at 11 am - I called pt by 1130 am and LM to call us back by 5 pm today to RS her PV or her procedure on 3-10 would be cancelled. Pt did not CB by 5 pm. I mailed her a NS letter - colon and PV cancelled   Lelan Pons pV

## 2019-01-31 DIAGNOSIS — A419 Sepsis, unspecified organism: Secondary | ICD-10-CM | POA: Diagnosis not present

## 2019-01-31 DIAGNOSIS — N186 End stage renal disease: Secondary | ICD-10-CM | POA: Diagnosis not present

## 2019-01-31 DIAGNOSIS — E876 Hypokalemia: Secondary | ICD-10-CM | POA: Diagnosis not present

## 2019-01-31 DIAGNOSIS — E1122 Type 2 diabetes mellitus with diabetic chronic kidney disease: Secondary | ICD-10-CM | POA: Diagnosis not present

## 2019-01-31 DIAGNOSIS — N2581 Secondary hyperparathyroidism of renal origin: Secondary | ICD-10-CM | POA: Diagnosis not present

## 2019-01-31 DIAGNOSIS — D631 Anemia in chronic kidney disease: Secondary | ICD-10-CM | POA: Diagnosis not present

## 2019-02-02 DIAGNOSIS — N186 End stage renal disease: Secondary | ICD-10-CM | POA: Diagnosis not present

## 2019-02-02 DIAGNOSIS — A419 Sepsis, unspecified organism: Secondary | ICD-10-CM | POA: Diagnosis not present

## 2019-02-02 DIAGNOSIS — D631 Anemia in chronic kidney disease: Secondary | ICD-10-CM | POA: Diagnosis not present

## 2019-02-02 DIAGNOSIS — N2581 Secondary hyperparathyroidism of renal origin: Secondary | ICD-10-CM | POA: Diagnosis not present

## 2019-02-02 DIAGNOSIS — E876 Hypokalemia: Secondary | ICD-10-CM | POA: Diagnosis not present

## 2019-02-02 DIAGNOSIS — E1122 Type 2 diabetes mellitus with diabetic chronic kidney disease: Secondary | ICD-10-CM | POA: Diagnosis not present

## 2019-02-04 DIAGNOSIS — N186 End stage renal disease: Secondary | ICD-10-CM | POA: Diagnosis not present

## 2019-02-04 DIAGNOSIS — Z992 Dependence on renal dialysis: Secondary | ICD-10-CM | POA: Diagnosis not present

## 2019-02-04 DIAGNOSIS — E1122 Type 2 diabetes mellitus with diabetic chronic kidney disease: Secondary | ICD-10-CM | POA: Diagnosis not present

## 2019-02-05 DIAGNOSIS — N186 End stage renal disease: Secondary | ICD-10-CM | POA: Diagnosis not present

## 2019-02-05 DIAGNOSIS — D631 Anemia in chronic kidney disease: Secondary | ICD-10-CM | POA: Diagnosis not present

## 2019-02-05 DIAGNOSIS — A419 Sepsis, unspecified organism: Secondary | ICD-10-CM | POA: Diagnosis not present

## 2019-02-05 DIAGNOSIS — E1122 Type 2 diabetes mellitus with diabetic chronic kidney disease: Secondary | ICD-10-CM | POA: Diagnosis not present

## 2019-02-05 DIAGNOSIS — E876 Hypokalemia: Secondary | ICD-10-CM | POA: Diagnosis not present

## 2019-02-05 DIAGNOSIS — N2581 Secondary hyperparathyroidism of renal origin: Secondary | ICD-10-CM | POA: Diagnosis not present

## 2019-02-07 DIAGNOSIS — D631 Anemia in chronic kidney disease: Secondary | ICD-10-CM | POA: Diagnosis not present

## 2019-02-07 DIAGNOSIS — N2581 Secondary hyperparathyroidism of renal origin: Secondary | ICD-10-CM | POA: Diagnosis not present

## 2019-02-07 DIAGNOSIS — E876 Hypokalemia: Secondary | ICD-10-CM | POA: Diagnosis not present

## 2019-02-07 DIAGNOSIS — A419 Sepsis, unspecified organism: Secondary | ICD-10-CM | POA: Diagnosis not present

## 2019-02-07 DIAGNOSIS — N186 End stage renal disease: Secondary | ICD-10-CM | POA: Diagnosis not present

## 2019-02-07 DIAGNOSIS — E1122 Type 2 diabetes mellitus with diabetic chronic kidney disease: Secondary | ICD-10-CM | POA: Diagnosis not present

## 2019-02-09 DIAGNOSIS — D631 Anemia in chronic kidney disease: Secondary | ICD-10-CM | POA: Diagnosis not present

## 2019-02-09 DIAGNOSIS — E1122 Type 2 diabetes mellitus with diabetic chronic kidney disease: Secondary | ICD-10-CM | POA: Diagnosis not present

## 2019-02-09 DIAGNOSIS — A419 Sepsis, unspecified organism: Secondary | ICD-10-CM | POA: Diagnosis not present

## 2019-02-09 DIAGNOSIS — N2581 Secondary hyperparathyroidism of renal origin: Secondary | ICD-10-CM | POA: Diagnosis not present

## 2019-02-09 DIAGNOSIS — N186 End stage renal disease: Secondary | ICD-10-CM | POA: Diagnosis not present

## 2019-02-09 DIAGNOSIS — E876 Hypokalemia: Secondary | ICD-10-CM | POA: Diagnosis not present

## 2019-02-12 DIAGNOSIS — E876 Hypokalemia: Secondary | ICD-10-CM | POA: Diagnosis not present

## 2019-02-12 DIAGNOSIS — N186 End stage renal disease: Secondary | ICD-10-CM | POA: Diagnosis not present

## 2019-02-12 DIAGNOSIS — E1122 Type 2 diabetes mellitus with diabetic chronic kidney disease: Secondary | ICD-10-CM | POA: Diagnosis not present

## 2019-02-12 DIAGNOSIS — A419 Sepsis, unspecified organism: Secondary | ICD-10-CM | POA: Diagnosis not present

## 2019-02-12 DIAGNOSIS — N2581 Secondary hyperparathyroidism of renal origin: Secondary | ICD-10-CM | POA: Diagnosis not present

## 2019-02-12 DIAGNOSIS — D631 Anemia in chronic kidney disease: Secondary | ICD-10-CM | POA: Diagnosis not present

## 2019-02-13 ENCOUNTER — Encounter: Payer: Medicare Other | Admitting: Gastroenterology

## 2019-02-14 DIAGNOSIS — N2581 Secondary hyperparathyroidism of renal origin: Secondary | ICD-10-CM | POA: Diagnosis not present

## 2019-02-14 DIAGNOSIS — N186 End stage renal disease: Secondary | ICD-10-CM | POA: Diagnosis not present

## 2019-02-14 DIAGNOSIS — E1122 Type 2 diabetes mellitus with diabetic chronic kidney disease: Secondary | ICD-10-CM | POA: Diagnosis not present

## 2019-02-14 DIAGNOSIS — E876 Hypokalemia: Secondary | ICD-10-CM | POA: Diagnosis not present

## 2019-02-14 DIAGNOSIS — A419 Sepsis, unspecified organism: Secondary | ICD-10-CM | POA: Diagnosis not present

## 2019-02-14 DIAGNOSIS — D631 Anemia in chronic kidney disease: Secondary | ICD-10-CM | POA: Diagnosis not present

## 2019-02-16 DIAGNOSIS — A419 Sepsis, unspecified organism: Secondary | ICD-10-CM | POA: Diagnosis not present

## 2019-02-16 DIAGNOSIS — N2581 Secondary hyperparathyroidism of renal origin: Secondary | ICD-10-CM | POA: Diagnosis not present

## 2019-02-16 DIAGNOSIS — D631 Anemia in chronic kidney disease: Secondary | ICD-10-CM | POA: Diagnosis not present

## 2019-02-16 DIAGNOSIS — E1122 Type 2 diabetes mellitus with diabetic chronic kidney disease: Secondary | ICD-10-CM | POA: Diagnosis not present

## 2019-02-16 DIAGNOSIS — N186 End stage renal disease: Secondary | ICD-10-CM | POA: Diagnosis not present

## 2019-02-16 DIAGNOSIS — E876 Hypokalemia: Secondary | ICD-10-CM | POA: Diagnosis not present

## 2019-02-19 DIAGNOSIS — D631 Anemia in chronic kidney disease: Secondary | ICD-10-CM | POA: Diagnosis not present

## 2019-02-19 DIAGNOSIS — E876 Hypokalemia: Secondary | ICD-10-CM | POA: Diagnosis not present

## 2019-02-19 DIAGNOSIS — N186 End stage renal disease: Secondary | ICD-10-CM | POA: Diagnosis not present

## 2019-02-19 DIAGNOSIS — A419 Sepsis, unspecified organism: Secondary | ICD-10-CM | POA: Diagnosis not present

## 2019-02-19 DIAGNOSIS — E1122 Type 2 diabetes mellitus with diabetic chronic kidney disease: Secondary | ICD-10-CM | POA: Diagnosis not present

## 2019-02-19 DIAGNOSIS — N2581 Secondary hyperparathyroidism of renal origin: Secondary | ICD-10-CM | POA: Diagnosis not present

## 2019-02-21 DIAGNOSIS — E876 Hypokalemia: Secondary | ICD-10-CM | POA: Diagnosis not present

## 2019-02-21 DIAGNOSIS — N186 End stage renal disease: Secondary | ICD-10-CM | POA: Diagnosis not present

## 2019-02-21 DIAGNOSIS — E1122 Type 2 diabetes mellitus with diabetic chronic kidney disease: Secondary | ICD-10-CM | POA: Diagnosis not present

## 2019-02-21 DIAGNOSIS — D631 Anemia in chronic kidney disease: Secondary | ICD-10-CM | POA: Diagnosis not present

## 2019-02-21 DIAGNOSIS — A419 Sepsis, unspecified organism: Secondary | ICD-10-CM | POA: Diagnosis not present

## 2019-02-21 DIAGNOSIS — N2581 Secondary hyperparathyroidism of renal origin: Secondary | ICD-10-CM | POA: Diagnosis not present

## 2019-02-23 DIAGNOSIS — D631 Anemia in chronic kidney disease: Secondary | ICD-10-CM | POA: Diagnosis not present

## 2019-02-23 DIAGNOSIS — N2581 Secondary hyperparathyroidism of renal origin: Secondary | ICD-10-CM | POA: Diagnosis not present

## 2019-02-23 DIAGNOSIS — N186 End stage renal disease: Secondary | ICD-10-CM | POA: Diagnosis not present

## 2019-02-23 DIAGNOSIS — E876 Hypokalemia: Secondary | ICD-10-CM | POA: Diagnosis not present

## 2019-02-23 DIAGNOSIS — A419 Sepsis, unspecified organism: Secondary | ICD-10-CM | POA: Diagnosis not present

## 2019-02-23 DIAGNOSIS — E1122 Type 2 diabetes mellitus with diabetic chronic kidney disease: Secondary | ICD-10-CM | POA: Diagnosis not present

## 2019-02-26 DIAGNOSIS — D631 Anemia in chronic kidney disease: Secondary | ICD-10-CM | POA: Diagnosis not present

## 2019-02-26 DIAGNOSIS — N2581 Secondary hyperparathyroidism of renal origin: Secondary | ICD-10-CM | POA: Diagnosis not present

## 2019-02-26 DIAGNOSIS — E876 Hypokalemia: Secondary | ICD-10-CM | POA: Diagnosis not present

## 2019-02-26 DIAGNOSIS — E1122 Type 2 diabetes mellitus with diabetic chronic kidney disease: Secondary | ICD-10-CM | POA: Diagnosis not present

## 2019-02-26 DIAGNOSIS — A419 Sepsis, unspecified organism: Secondary | ICD-10-CM | POA: Diagnosis not present

## 2019-02-26 DIAGNOSIS — N186 End stage renal disease: Secondary | ICD-10-CM | POA: Diagnosis not present

## 2019-02-28 DIAGNOSIS — N186 End stage renal disease: Secondary | ICD-10-CM | POA: Diagnosis not present

## 2019-02-28 DIAGNOSIS — N2581 Secondary hyperparathyroidism of renal origin: Secondary | ICD-10-CM | POA: Diagnosis not present

## 2019-02-28 DIAGNOSIS — E1122 Type 2 diabetes mellitus with diabetic chronic kidney disease: Secondary | ICD-10-CM | POA: Diagnosis not present

## 2019-02-28 DIAGNOSIS — A419 Sepsis, unspecified organism: Secondary | ICD-10-CM | POA: Diagnosis not present

## 2019-02-28 DIAGNOSIS — E876 Hypokalemia: Secondary | ICD-10-CM | POA: Diagnosis not present

## 2019-02-28 DIAGNOSIS — D631 Anemia in chronic kidney disease: Secondary | ICD-10-CM | POA: Diagnosis not present

## 2019-03-02 DIAGNOSIS — E1122 Type 2 diabetes mellitus with diabetic chronic kidney disease: Secondary | ICD-10-CM | POA: Diagnosis not present

## 2019-03-02 DIAGNOSIS — A419 Sepsis, unspecified organism: Secondary | ICD-10-CM | POA: Diagnosis not present

## 2019-03-02 DIAGNOSIS — N2581 Secondary hyperparathyroidism of renal origin: Secondary | ICD-10-CM | POA: Diagnosis not present

## 2019-03-02 DIAGNOSIS — D631 Anemia in chronic kidney disease: Secondary | ICD-10-CM | POA: Diagnosis not present

## 2019-03-02 DIAGNOSIS — N186 End stage renal disease: Secondary | ICD-10-CM | POA: Diagnosis not present

## 2019-03-02 DIAGNOSIS — E876 Hypokalemia: Secondary | ICD-10-CM | POA: Diagnosis not present

## 2019-03-05 ENCOUNTER — Other Ambulatory Visit: Payer: Self-pay | Admitting: Family Medicine

## 2019-03-05 DIAGNOSIS — A419 Sepsis, unspecified organism: Secondary | ICD-10-CM | POA: Diagnosis not present

## 2019-03-05 DIAGNOSIS — E876 Hypokalemia: Secondary | ICD-10-CM | POA: Diagnosis not present

## 2019-03-05 DIAGNOSIS — E1122 Type 2 diabetes mellitus with diabetic chronic kidney disease: Secondary | ICD-10-CM | POA: Diagnosis not present

## 2019-03-05 DIAGNOSIS — N186 End stage renal disease: Secondary | ICD-10-CM | POA: Diagnosis not present

## 2019-03-05 DIAGNOSIS — D631 Anemia in chronic kidney disease: Secondary | ICD-10-CM | POA: Diagnosis not present

## 2019-03-05 DIAGNOSIS — N2581 Secondary hyperparathyroidism of renal origin: Secondary | ICD-10-CM | POA: Diagnosis not present

## 2019-03-07 DIAGNOSIS — Z992 Dependence on renal dialysis: Secondary | ICD-10-CM | POA: Diagnosis not present

## 2019-03-07 DIAGNOSIS — D509 Iron deficiency anemia, unspecified: Secondary | ICD-10-CM | POA: Diagnosis not present

## 2019-03-07 DIAGNOSIS — N186 End stage renal disease: Secondary | ICD-10-CM | POA: Diagnosis not present

## 2019-03-07 DIAGNOSIS — N2581 Secondary hyperparathyroidism of renal origin: Secondary | ICD-10-CM | POA: Diagnosis not present

## 2019-03-07 DIAGNOSIS — E1122 Type 2 diabetes mellitus with diabetic chronic kidney disease: Secondary | ICD-10-CM | POA: Diagnosis not present

## 2019-03-07 DIAGNOSIS — E876 Hypokalemia: Secondary | ICD-10-CM | POA: Diagnosis not present

## 2019-03-07 DIAGNOSIS — D631 Anemia in chronic kidney disease: Secondary | ICD-10-CM | POA: Diagnosis not present

## 2019-03-09 DIAGNOSIS — E1122 Type 2 diabetes mellitus with diabetic chronic kidney disease: Secondary | ICD-10-CM | POA: Diagnosis not present

## 2019-03-09 DIAGNOSIS — N2581 Secondary hyperparathyroidism of renal origin: Secondary | ICD-10-CM | POA: Diagnosis not present

## 2019-03-09 DIAGNOSIS — N186 End stage renal disease: Secondary | ICD-10-CM | POA: Diagnosis not present

## 2019-03-09 DIAGNOSIS — D509 Iron deficiency anemia, unspecified: Secondary | ICD-10-CM | POA: Diagnosis not present

## 2019-03-09 DIAGNOSIS — E876 Hypokalemia: Secondary | ICD-10-CM | POA: Diagnosis not present

## 2019-03-09 DIAGNOSIS — D631 Anemia in chronic kidney disease: Secondary | ICD-10-CM | POA: Diagnosis not present

## 2019-03-12 DIAGNOSIS — E876 Hypokalemia: Secondary | ICD-10-CM | POA: Diagnosis not present

## 2019-03-12 DIAGNOSIS — D631 Anemia in chronic kidney disease: Secondary | ICD-10-CM | POA: Diagnosis not present

## 2019-03-12 DIAGNOSIS — E1122 Type 2 diabetes mellitus with diabetic chronic kidney disease: Secondary | ICD-10-CM | POA: Diagnosis not present

## 2019-03-12 DIAGNOSIS — N186 End stage renal disease: Secondary | ICD-10-CM | POA: Diagnosis not present

## 2019-03-12 DIAGNOSIS — D509 Iron deficiency anemia, unspecified: Secondary | ICD-10-CM | POA: Diagnosis not present

## 2019-03-12 DIAGNOSIS — N2581 Secondary hyperparathyroidism of renal origin: Secondary | ICD-10-CM | POA: Diagnosis not present

## 2019-03-14 DIAGNOSIS — N186 End stage renal disease: Secondary | ICD-10-CM | POA: Diagnosis not present

## 2019-03-14 DIAGNOSIS — E1122 Type 2 diabetes mellitus with diabetic chronic kidney disease: Secondary | ICD-10-CM | POA: Diagnosis not present

## 2019-03-14 DIAGNOSIS — E876 Hypokalemia: Secondary | ICD-10-CM | POA: Diagnosis not present

## 2019-03-14 DIAGNOSIS — D631 Anemia in chronic kidney disease: Secondary | ICD-10-CM | POA: Diagnosis not present

## 2019-03-14 DIAGNOSIS — D509 Iron deficiency anemia, unspecified: Secondary | ICD-10-CM | POA: Diagnosis not present

## 2019-03-14 DIAGNOSIS — N2581 Secondary hyperparathyroidism of renal origin: Secondary | ICD-10-CM | POA: Diagnosis not present

## 2019-03-16 DIAGNOSIS — N2581 Secondary hyperparathyroidism of renal origin: Secondary | ICD-10-CM | POA: Diagnosis not present

## 2019-03-16 DIAGNOSIS — N186 End stage renal disease: Secondary | ICD-10-CM | POA: Diagnosis not present

## 2019-03-16 DIAGNOSIS — D509 Iron deficiency anemia, unspecified: Secondary | ICD-10-CM | POA: Diagnosis not present

## 2019-03-16 DIAGNOSIS — E876 Hypokalemia: Secondary | ICD-10-CM | POA: Diagnosis not present

## 2019-03-16 DIAGNOSIS — E1122 Type 2 diabetes mellitus with diabetic chronic kidney disease: Secondary | ICD-10-CM | POA: Diagnosis not present

## 2019-03-16 DIAGNOSIS — D631 Anemia in chronic kidney disease: Secondary | ICD-10-CM | POA: Diagnosis not present

## 2019-03-19 DIAGNOSIS — D631 Anemia in chronic kidney disease: Secondary | ICD-10-CM | POA: Diagnosis not present

## 2019-03-19 DIAGNOSIS — E1122 Type 2 diabetes mellitus with diabetic chronic kidney disease: Secondary | ICD-10-CM | POA: Diagnosis not present

## 2019-03-19 DIAGNOSIS — D509 Iron deficiency anemia, unspecified: Secondary | ICD-10-CM | POA: Diagnosis not present

## 2019-03-19 DIAGNOSIS — N2581 Secondary hyperparathyroidism of renal origin: Secondary | ICD-10-CM | POA: Diagnosis not present

## 2019-03-19 DIAGNOSIS — N186 End stage renal disease: Secondary | ICD-10-CM | POA: Diagnosis not present

## 2019-03-19 DIAGNOSIS — E876 Hypokalemia: Secondary | ICD-10-CM | POA: Diagnosis not present

## 2019-03-21 DIAGNOSIS — E1122 Type 2 diabetes mellitus with diabetic chronic kidney disease: Secondary | ICD-10-CM | POA: Diagnosis not present

## 2019-03-21 DIAGNOSIS — N2581 Secondary hyperparathyroidism of renal origin: Secondary | ICD-10-CM | POA: Diagnosis not present

## 2019-03-21 DIAGNOSIS — D631 Anemia in chronic kidney disease: Secondary | ICD-10-CM | POA: Diagnosis not present

## 2019-03-21 DIAGNOSIS — E876 Hypokalemia: Secondary | ICD-10-CM | POA: Diagnosis not present

## 2019-03-21 DIAGNOSIS — N186 End stage renal disease: Secondary | ICD-10-CM | POA: Diagnosis not present

## 2019-03-21 DIAGNOSIS — D509 Iron deficiency anemia, unspecified: Secondary | ICD-10-CM | POA: Diagnosis not present

## 2019-03-23 DIAGNOSIS — E876 Hypokalemia: Secondary | ICD-10-CM | POA: Diagnosis not present

## 2019-03-23 DIAGNOSIS — E1122 Type 2 diabetes mellitus with diabetic chronic kidney disease: Secondary | ICD-10-CM | POA: Diagnosis not present

## 2019-03-23 DIAGNOSIS — D631 Anemia in chronic kidney disease: Secondary | ICD-10-CM | POA: Diagnosis not present

## 2019-03-23 DIAGNOSIS — N2581 Secondary hyperparathyroidism of renal origin: Secondary | ICD-10-CM | POA: Diagnosis not present

## 2019-03-23 DIAGNOSIS — N186 End stage renal disease: Secondary | ICD-10-CM | POA: Diagnosis not present

## 2019-03-23 DIAGNOSIS — D509 Iron deficiency anemia, unspecified: Secondary | ICD-10-CM | POA: Diagnosis not present

## 2019-03-26 DIAGNOSIS — E1122 Type 2 diabetes mellitus with diabetic chronic kidney disease: Secondary | ICD-10-CM | POA: Diagnosis not present

## 2019-03-26 DIAGNOSIS — N2581 Secondary hyperparathyroidism of renal origin: Secondary | ICD-10-CM | POA: Diagnosis not present

## 2019-03-26 DIAGNOSIS — D631 Anemia in chronic kidney disease: Secondary | ICD-10-CM | POA: Diagnosis not present

## 2019-03-26 DIAGNOSIS — N186 End stage renal disease: Secondary | ICD-10-CM | POA: Diagnosis not present

## 2019-03-26 DIAGNOSIS — D509 Iron deficiency anemia, unspecified: Secondary | ICD-10-CM | POA: Diagnosis not present

## 2019-03-26 DIAGNOSIS — E876 Hypokalemia: Secondary | ICD-10-CM | POA: Diagnosis not present

## 2019-03-28 DIAGNOSIS — N2581 Secondary hyperparathyroidism of renal origin: Secondary | ICD-10-CM | POA: Diagnosis not present

## 2019-03-28 DIAGNOSIS — D509 Iron deficiency anemia, unspecified: Secondary | ICD-10-CM | POA: Diagnosis not present

## 2019-03-28 DIAGNOSIS — D631 Anemia in chronic kidney disease: Secondary | ICD-10-CM | POA: Diagnosis not present

## 2019-03-28 DIAGNOSIS — N186 End stage renal disease: Secondary | ICD-10-CM | POA: Diagnosis not present

## 2019-03-28 DIAGNOSIS — E876 Hypokalemia: Secondary | ICD-10-CM | POA: Diagnosis not present

## 2019-03-28 DIAGNOSIS — E1122 Type 2 diabetes mellitus with diabetic chronic kidney disease: Secondary | ICD-10-CM | POA: Diagnosis not present

## 2019-03-30 DIAGNOSIS — D631 Anemia in chronic kidney disease: Secondary | ICD-10-CM | POA: Diagnosis not present

## 2019-03-30 DIAGNOSIS — D509 Iron deficiency anemia, unspecified: Secondary | ICD-10-CM | POA: Diagnosis not present

## 2019-03-30 DIAGNOSIS — E876 Hypokalemia: Secondary | ICD-10-CM | POA: Diagnosis not present

## 2019-03-30 DIAGNOSIS — E1122 Type 2 diabetes mellitus with diabetic chronic kidney disease: Secondary | ICD-10-CM | POA: Diagnosis not present

## 2019-03-30 DIAGNOSIS — N186 End stage renal disease: Secondary | ICD-10-CM | POA: Diagnosis not present

## 2019-03-30 DIAGNOSIS — N2581 Secondary hyperparathyroidism of renal origin: Secondary | ICD-10-CM | POA: Diagnosis not present

## 2019-04-02 ENCOUNTER — Other Ambulatory Visit: Payer: Self-pay | Admitting: Family Medicine

## 2019-04-02 DIAGNOSIS — D509 Iron deficiency anemia, unspecified: Secondary | ICD-10-CM | POA: Diagnosis not present

## 2019-04-02 DIAGNOSIS — D631 Anemia in chronic kidney disease: Secondary | ICD-10-CM | POA: Diagnosis not present

## 2019-04-02 DIAGNOSIS — N186 End stage renal disease: Secondary | ICD-10-CM | POA: Diagnosis not present

## 2019-04-02 DIAGNOSIS — N2581 Secondary hyperparathyroidism of renal origin: Secondary | ICD-10-CM | POA: Diagnosis not present

## 2019-04-02 DIAGNOSIS — E876 Hypokalemia: Secondary | ICD-10-CM | POA: Diagnosis not present

## 2019-04-02 DIAGNOSIS — E1122 Type 2 diabetes mellitus with diabetic chronic kidney disease: Secondary | ICD-10-CM | POA: Diagnosis not present

## 2019-04-03 ENCOUNTER — Telehealth: Payer: Self-pay | Admitting: Family Medicine

## 2019-04-03 NOTE — Telephone Encounter (Signed)
Received Rx request for patient's metronidazole. She was continued on this after hospital d/c 12/26/18 for hepatic abscess with plans to continue abx for 6 weeks until patient had appendicolith removed by Surgery. She was to continue antibiotics if patient had not had surgery by that time. Spoke to patient today, she has not yet had surgery. She had an appointment scheduled 3/6 but had to be rescheduled for June. Called [facility] to see if they want her to stay on antibiotics until they see her in June or if they had additional recommendations. Awaiting call back.  Rory Percy, DO PGY-2, [facility] 04/03/2019 2:22 PM

## 2019-04-04 DIAGNOSIS — D509 Iron deficiency anemia, unspecified: Secondary | ICD-10-CM | POA: Diagnosis not present

## 2019-04-04 DIAGNOSIS — N2581 Secondary hyperparathyroidism of renal origin: Secondary | ICD-10-CM | POA: Diagnosis not present

## 2019-04-04 DIAGNOSIS — E876 Hypokalemia: Secondary | ICD-10-CM | POA: Diagnosis not present

## 2019-04-04 DIAGNOSIS — E1122 Type 2 diabetes mellitus with diabetic chronic kidney disease: Secondary | ICD-10-CM | POA: Diagnosis not present

## 2019-04-04 DIAGNOSIS — N186 End stage renal disease: Secondary | ICD-10-CM | POA: Diagnosis not present

## 2019-04-04 DIAGNOSIS — D631 Anemia in chronic kidney disease: Secondary | ICD-10-CM | POA: Diagnosis not present

## 2019-04-05 NOTE — Telephone Encounter (Signed)
Please let patient know I spoke with Surgery and they do not want her to continue the metronidazole (I would call but I'm on nights) so I will not refill this. She should keep her appointment with them on June 11th.

## 2019-04-06 DIAGNOSIS — N2581 Secondary hyperparathyroidism of renal origin: Secondary | ICD-10-CM | POA: Diagnosis not present

## 2019-04-06 DIAGNOSIS — N186 End stage renal disease: Secondary | ICD-10-CM | POA: Diagnosis not present

## 2019-04-06 DIAGNOSIS — Z992 Dependence on renal dialysis: Secondary | ICD-10-CM | POA: Diagnosis not present

## 2019-04-06 DIAGNOSIS — E1122 Type 2 diabetes mellitus with diabetic chronic kidney disease: Secondary | ICD-10-CM | POA: Diagnosis not present

## 2019-04-06 DIAGNOSIS — E876 Hypokalemia: Secondary | ICD-10-CM | POA: Diagnosis not present

## 2019-04-06 NOTE — Telephone Encounter (Signed)
Spoke with patient and she voiced understanding.  Since they do not want her to continue it, she will stop taking until she sees them.  [name],CMA

## 2019-04-09 DIAGNOSIS — N2581 Secondary hyperparathyroidism of renal origin: Secondary | ICD-10-CM | POA: Diagnosis not present

## 2019-04-09 DIAGNOSIS — E1122 Type 2 diabetes mellitus with diabetic chronic kidney disease: Secondary | ICD-10-CM | POA: Diagnosis not present

## 2019-04-09 DIAGNOSIS — E876 Hypokalemia: Secondary | ICD-10-CM | POA: Diagnosis not present

## 2019-04-09 DIAGNOSIS — N186 End stage renal disease: Secondary | ICD-10-CM | POA: Diagnosis not present

## 2019-04-11 DIAGNOSIS — E1122 Type 2 diabetes mellitus with diabetic chronic kidney disease: Secondary | ICD-10-CM | POA: Diagnosis not present

## 2019-04-11 DIAGNOSIS — E876 Hypokalemia: Secondary | ICD-10-CM | POA: Diagnosis not present

## 2019-04-11 DIAGNOSIS — N186 End stage renal disease: Secondary | ICD-10-CM | POA: Diagnosis not present

## 2019-04-11 DIAGNOSIS — N2581 Secondary hyperparathyroidism of renal origin: Secondary | ICD-10-CM | POA: Diagnosis not present

## 2019-04-14 DIAGNOSIS — E1122 Type 2 diabetes mellitus with diabetic chronic kidney disease: Secondary | ICD-10-CM | POA: Diagnosis not present

## 2019-04-14 DIAGNOSIS — E876 Hypokalemia: Secondary | ICD-10-CM | POA: Diagnosis not present

## 2019-04-14 DIAGNOSIS — N186 End stage renal disease: Secondary | ICD-10-CM | POA: Diagnosis not present

## 2019-04-14 DIAGNOSIS — N2581 Secondary hyperparathyroidism of renal origin: Secondary | ICD-10-CM | POA: Diagnosis not present

## 2019-04-16 DIAGNOSIS — E876 Hypokalemia: Secondary | ICD-10-CM | POA: Diagnosis not present

## 2019-04-16 DIAGNOSIS — N2581 Secondary hyperparathyroidism of renal origin: Secondary | ICD-10-CM | POA: Diagnosis not present

## 2019-04-16 DIAGNOSIS — N186 End stage renal disease: Secondary | ICD-10-CM | POA: Diagnosis not present

## 2019-04-16 DIAGNOSIS — E1122 Type 2 diabetes mellitus with diabetic chronic kidney disease: Secondary | ICD-10-CM | POA: Diagnosis not present

## 2019-04-18 DIAGNOSIS — N186 End stage renal disease: Secondary | ICD-10-CM | POA: Diagnosis not present

## 2019-04-18 DIAGNOSIS — N2581 Secondary hyperparathyroidism of renal origin: Secondary | ICD-10-CM | POA: Diagnosis not present

## 2019-04-18 DIAGNOSIS — E876 Hypokalemia: Secondary | ICD-10-CM | POA: Diagnosis not present

## 2019-04-18 DIAGNOSIS — E1122 Type 2 diabetes mellitus with diabetic chronic kidney disease: Secondary | ICD-10-CM | POA: Diagnosis not present

## 2019-04-20 DIAGNOSIS — E876 Hypokalemia: Secondary | ICD-10-CM | POA: Diagnosis not present

## 2019-04-20 DIAGNOSIS — E1122 Type 2 diabetes mellitus with diabetic chronic kidney disease: Secondary | ICD-10-CM | POA: Diagnosis not present

## 2019-04-20 DIAGNOSIS — N186 End stage renal disease: Secondary | ICD-10-CM | POA: Diagnosis not present

## 2019-04-20 DIAGNOSIS — N2581 Secondary hyperparathyroidism of renal origin: Secondary | ICD-10-CM | POA: Diagnosis not present

## 2019-04-23 DIAGNOSIS — E876 Hypokalemia: Secondary | ICD-10-CM | POA: Diagnosis not present

## 2019-04-23 DIAGNOSIS — N2581 Secondary hyperparathyroidism of renal origin: Secondary | ICD-10-CM | POA: Diagnosis not present

## 2019-04-23 DIAGNOSIS — N186 End stage renal disease: Secondary | ICD-10-CM | POA: Diagnosis not present

## 2019-04-23 DIAGNOSIS — E1122 Type 2 diabetes mellitus with diabetic chronic kidney disease: Secondary | ICD-10-CM | POA: Diagnosis not present

## 2019-04-25 DIAGNOSIS — N186 End stage renal disease: Secondary | ICD-10-CM | POA: Diagnosis not present

## 2019-04-25 DIAGNOSIS — E1122 Type 2 diabetes mellitus with diabetic chronic kidney disease: Secondary | ICD-10-CM | POA: Diagnosis not present

## 2019-04-25 DIAGNOSIS — E876 Hypokalemia: Secondary | ICD-10-CM | POA: Diagnosis not present

## 2019-04-25 DIAGNOSIS — N2581 Secondary hyperparathyroidism of renal origin: Secondary | ICD-10-CM | POA: Diagnosis not present

## 2019-04-27 DIAGNOSIS — E876 Hypokalemia: Secondary | ICD-10-CM | POA: Diagnosis not present

## 2019-04-27 DIAGNOSIS — E1122 Type 2 diabetes mellitus with diabetic chronic kidney disease: Secondary | ICD-10-CM | POA: Diagnosis not present

## 2019-04-27 DIAGNOSIS — N2581 Secondary hyperparathyroidism of renal origin: Secondary | ICD-10-CM | POA: Diagnosis not present

## 2019-04-27 DIAGNOSIS — N186 End stage renal disease: Secondary | ICD-10-CM | POA: Diagnosis not present

## 2019-04-30 DIAGNOSIS — N186 End stage renal disease: Secondary | ICD-10-CM | POA: Diagnosis not present

## 2019-04-30 DIAGNOSIS — E1122 Type 2 diabetes mellitus with diabetic chronic kidney disease: Secondary | ICD-10-CM | POA: Diagnosis not present

## 2019-04-30 DIAGNOSIS — N2581 Secondary hyperparathyroidism of renal origin: Secondary | ICD-10-CM | POA: Diagnosis not present

## 2019-04-30 DIAGNOSIS — E876 Hypokalemia: Secondary | ICD-10-CM | POA: Diagnosis not present

## 2019-05-02 DIAGNOSIS — E876 Hypokalemia: Secondary | ICD-10-CM | POA: Diagnosis not present

## 2019-05-02 DIAGNOSIS — N2581 Secondary hyperparathyroidism of renal origin: Secondary | ICD-10-CM | POA: Diagnosis not present

## 2019-05-02 DIAGNOSIS — E1122 Type 2 diabetes mellitus with diabetic chronic kidney disease: Secondary | ICD-10-CM | POA: Diagnosis not present

## 2019-05-02 DIAGNOSIS — N186 End stage renal disease: Secondary | ICD-10-CM | POA: Diagnosis not present

## 2019-05-04 DIAGNOSIS — N186 End stage renal disease: Secondary | ICD-10-CM | POA: Diagnosis not present

## 2019-05-04 DIAGNOSIS — E876 Hypokalemia: Secondary | ICD-10-CM | POA: Diagnosis not present

## 2019-05-04 DIAGNOSIS — N2581 Secondary hyperparathyroidism of renal origin: Secondary | ICD-10-CM | POA: Diagnosis not present

## 2019-05-04 DIAGNOSIS — E1122 Type 2 diabetes mellitus with diabetic chronic kidney disease: Secondary | ICD-10-CM | POA: Diagnosis not present

## 2019-05-07 ENCOUNTER — Other Ambulatory Visit: Payer: Self-pay | Admitting: Family Medicine

## 2019-05-07 DIAGNOSIS — E876 Hypokalemia: Secondary | ICD-10-CM | POA: Diagnosis not present

## 2019-05-07 DIAGNOSIS — D631 Anemia in chronic kidney disease: Secondary | ICD-10-CM | POA: Diagnosis not present

## 2019-05-07 DIAGNOSIS — N186 End stage renal disease: Secondary | ICD-10-CM | POA: Diagnosis not present

## 2019-05-07 DIAGNOSIS — N2581 Secondary hyperparathyroidism of renal origin: Secondary | ICD-10-CM | POA: Diagnosis not present

## 2019-05-07 DIAGNOSIS — Z992 Dependence on renal dialysis: Secondary | ICD-10-CM | POA: Diagnosis not present

## 2019-05-07 DIAGNOSIS — E1122 Type 2 diabetes mellitus with diabetic chronic kidney disease: Secondary | ICD-10-CM | POA: Diagnosis not present

## 2019-05-08 NOTE — Telephone Encounter (Signed)
[  Late entry]  Spoke with [facility], patient has appt in early June. She does not need to continue Flagyl.  Rory Percy, DO PGY-2, [facility] 05/08/2019 9:22 AM

## 2019-05-09 DIAGNOSIS — E876 Hypokalemia: Secondary | ICD-10-CM | POA: Diagnosis not present

## 2019-05-09 DIAGNOSIS — E1122 Type 2 diabetes mellitus with diabetic chronic kidney disease: Secondary | ICD-10-CM | POA: Diagnosis not present

## 2019-05-09 DIAGNOSIS — N2581 Secondary hyperparathyroidism of renal origin: Secondary | ICD-10-CM | POA: Diagnosis not present

## 2019-05-09 DIAGNOSIS — N186 End stage renal disease: Secondary | ICD-10-CM | POA: Diagnosis not present

## 2019-05-09 DIAGNOSIS — D631 Anemia in chronic kidney disease: Secondary | ICD-10-CM | POA: Diagnosis not present

## 2019-05-11 DIAGNOSIS — N2581 Secondary hyperparathyroidism of renal origin: Secondary | ICD-10-CM | POA: Diagnosis not present

## 2019-05-11 DIAGNOSIS — N186 End stage renal disease: Secondary | ICD-10-CM | POA: Diagnosis not present

## 2019-05-11 DIAGNOSIS — D631 Anemia in chronic kidney disease: Secondary | ICD-10-CM | POA: Diagnosis not present

## 2019-05-11 DIAGNOSIS — E1122 Type 2 diabetes mellitus with diabetic chronic kidney disease: Secondary | ICD-10-CM | POA: Diagnosis not present

## 2019-05-11 DIAGNOSIS — E876 Hypokalemia: Secondary | ICD-10-CM | POA: Diagnosis not present

## 2019-05-14 DIAGNOSIS — N2581 Secondary hyperparathyroidism of renal origin: Secondary | ICD-10-CM | POA: Diagnosis not present

## 2019-05-14 DIAGNOSIS — E1122 Type 2 diabetes mellitus with diabetic chronic kidney disease: Secondary | ICD-10-CM | POA: Diagnosis not present

## 2019-05-14 DIAGNOSIS — N186 End stage renal disease: Secondary | ICD-10-CM | POA: Diagnosis not present

## 2019-05-14 DIAGNOSIS — D631 Anemia in chronic kidney disease: Secondary | ICD-10-CM | POA: Diagnosis not present

## 2019-05-14 DIAGNOSIS — E876 Hypokalemia: Secondary | ICD-10-CM | POA: Diagnosis not present

## 2019-05-16 DIAGNOSIS — N186 End stage renal disease: Secondary | ICD-10-CM | POA: Diagnosis not present

## 2019-05-16 DIAGNOSIS — E876 Hypokalemia: Secondary | ICD-10-CM | POA: Diagnosis not present

## 2019-05-16 DIAGNOSIS — D631 Anemia in chronic kidney disease: Secondary | ICD-10-CM | POA: Diagnosis not present

## 2019-05-16 DIAGNOSIS — E1122 Type 2 diabetes mellitus with diabetic chronic kidney disease: Secondary | ICD-10-CM | POA: Diagnosis not present

## 2019-05-16 DIAGNOSIS — N2581 Secondary hyperparathyroidism of renal origin: Secondary | ICD-10-CM | POA: Diagnosis not present

## 2019-05-18 DIAGNOSIS — E876 Hypokalemia: Secondary | ICD-10-CM | POA: Diagnosis not present

## 2019-05-18 DIAGNOSIS — E1122 Type 2 diabetes mellitus with diabetic chronic kidney disease: Secondary | ICD-10-CM | POA: Diagnosis not present

## 2019-05-18 DIAGNOSIS — N186 End stage renal disease: Secondary | ICD-10-CM | POA: Diagnosis not present

## 2019-05-18 DIAGNOSIS — D631 Anemia in chronic kidney disease: Secondary | ICD-10-CM | POA: Diagnosis not present

## 2019-05-18 DIAGNOSIS — N2581 Secondary hyperparathyroidism of renal origin: Secondary | ICD-10-CM | POA: Diagnosis not present

## 2019-05-21 DIAGNOSIS — N186 End stage renal disease: Secondary | ICD-10-CM | POA: Diagnosis not present

## 2019-05-21 DIAGNOSIS — D631 Anemia in chronic kidney disease: Secondary | ICD-10-CM | POA: Diagnosis not present

## 2019-05-21 DIAGNOSIS — E1122 Type 2 diabetes mellitus with diabetic chronic kidney disease: Secondary | ICD-10-CM | POA: Diagnosis not present

## 2019-05-21 DIAGNOSIS — E876 Hypokalemia: Secondary | ICD-10-CM | POA: Diagnosis not present

## 2019-05-21 DIAGNOSIS — N2581 Secondary hyperparathyroidism of renal origin: Secondary | ICD-10-CM | POA: Diagnosis not present

## 2019-05-23 DIAGNOSIS — N186 End stage renal disease: Secondary | ICD-10-CM | POA: Diagnosis not present

## 2019-05-23 DIAGNOSIS — N2581 Secondary hyperparathyroidism of renal origin: Secondary | ICD-10-CM | POA: Diagnosis not present

## 2019-05-23 DIAGNOSIS — E1122 Type 2 diabetes mellitus with diabetic chronic kidney disease: Secondary | ICD-10-CM | POA: Diagnosis not present

## 2019-05-23 DIAGNOSIS — D631 Anemia in chronic kidney disease: Secondary | ICD-10-CM | POA: Diagnosis not present

## 2019-05-23 DIAGNOSIS — E876 Hypokalemia: Secondary | ICD-10-CM | POA: Diagnosis not present

## 2019-05-25 DIAGNOSIS — N186 End stage renal disease: Secondary | ICD-10-CM | POA: Diagnosis not present

## 2019-05-25 DIAGNOSIS — E876 Hypokalemia: Secondary | ICD-10-CM | POA: Diagnosis not present

## 2019-05-25 DIAGNOSIS — E1122 Type 2 diabetes mellitus with diabetic chronic kidney disease: Secondary | ICD-10-CM | POA: Diagnosis not present

## 2019-05-25 DIAGNOSIS — D631 Anemia in chronic kidney disease: Secondary | ICD-10-CM | POA: Diagnosis not present

## 2019-05-25 DIAGNOSIS — N2581 Secondary hyperparathyroidism of renal origin: Secondary | ICD-10-CM | POA: Diagnosis not present

## 2019-05-28 DIAGNOSIS — E1122 Type 2 diabetes mellitus with diabetic chronic kidney disease: Secondary | ICD-10-CM | POA: Diagnosis not present

## 2019-05-28 DIAGNOSIS — E876 Hypokalemia: Secondary | ICD-10-CM | POA: Diagnosis not present

## 2019-05-28 DIAGNOSIS — N2581 Secondary hyperparathyroidism of renal origin: Secondary | ICD-10-CM | POA: Diagnosis not present

## 2019-05-28 DIAGNOSIS — N186 End stage renal disease: Secondary | ICD-10-CM | POA: Diagnosis not present

## 2019-05-28 DIAGNOSIS — D631 Anemia in chronic kidney disease: Secondary | ICD-10-CM | POA: Diagnosis not present

## 2019-05-30 DIAGNOSIS — D631 Anemia in chronic kidney disease: Secondary | ICD-10-CM | POA: Diagnosis not present

## 2019-05-30 DIAGNOSIS — E1122 Type 2 diabetes mellitus with diabetic chronic kidney disease: Secondary | ICD-10-CM | POA: Diagnosis not present

## 2019-05-30 DIAGNOSIS — N186 End stage renal disease: Secondary | ICD-10-CM | POA: Diagnosis not present

## 2019-05-30 DIAGNOSIS — N2581 Secondary hyperparathyroidism of renal origin: Secondary | ICD-10-CM | POA: Diagnosis not present

## 2019-05-30 DIAGNOSIS — E876 Hypokalemia: Secondary | ICD-10-CM | POA: Diagnosis not present

## 2019-06-01 DIAGNOSIS — E1122 Type 2 diabetes mellitus with diabetic chronic kidney disease: Secondary | ICD-10-CM | POA: Diagnosis not present

## 2019-06-01 DIAGNOSIS — N2581 Secondary hyperparathyroidism of renal origin: Secondary | ICD-10-CM | POA: Diagnosis not present

## 2019-06-01 DIAGNOSIS — N186 End stage renal disease: Secondary | ICD-10-CM | POA: Diagnosis not present

## 2019-06-01 DIAGNOSIS — E876 Hypokalemia: Secondary | ICD-10-CM | POA: Diagnosis not present

## 2019-06-01 DIAGNOSIS — D631 Anemia in chronic kidney disease: Secondary | ICD-10-CM | POA: Diagnosis not present

## 2019-06-04 ENCOUNTER — Other Ambulatory Visit: Payer: Self-pay | Admitting: Family Medicine

## 2019-06-04 DIAGNOSIS — D631 Anemia in chronic kidney disease: Secondary | ICD-10-CM | POA: Diagnosis not present

## 2019-06-04 DIAGNOSIS — N186 End stage renal disease: Secondary | ICD-10-CM | POA: Diagnosis not present

## 2019-06-04 DIAGNOSIS — E1122 Type 2 diabetes mellitus with diabetic chronic kidney disease: Secondary | ICD-10-CM | POA: Diagnosis not present

## 2019-06-04 DIAGNOSIS — N2581 Secondary hyperparathyroidism of renal origin: Secondary | ICD-10-CM | POA: Diagnosis not present

## 2019-06-04 DIAGNOSIS — E876 Hypokalemia: Secondary | ICD-10-CM | POA: Diagnosis not present

## 2019-06-06 DIAGNOSIS — N2581 Secondary hyperparathyroidism of renal origin: Secondary | ICD-10-CM | POA: Diagnosis not present

## 2019-06-06 DIAGNOSIS — N186 End stage renal disease: Secondary | ICD-10-CM | POA: Diagnosis not present

## 2019-06-06 DIAGNOSIS — E1122 Type 2 diabetes mellitus with diabetic chronic kidney disease: Secondary | ICD-10-CM | POA: Diagnosis not present

## 2019-06-06 DIAGNOSIS — Z992 Dependence on renal dialysis: Secondary | ICD-10-CM | POA: Diagnosis not present

## 2019-06-06 DIAGNOSIS — E876 Hypokalemia: Secondary | ICD-10-CM | POA: Diagnosis not present

## 2019-06-06 DIAGNOSIS — D631 Anemia in chronic kidney disease: Secondary | ICD-10-CM | POA: Diagnosis not present

## 2019-06-06 DIAGNOSIS — D509 Iron deficiency anemia, unspecified: Secondary | ICD-10-CM | POA: Diagnosis not present

## 2019-06-08 DIAGNOSIS — E1122 Type 2 diabetes mellitus with diabetic chronic kidney disease: Secondary | ICD-10-CM | POA: Diagnosis not present

## 2019-06-08 DIAGNOSIS — N2581 Secondary hyperparathyroidism of renal origin: Secondary | ICD-10-CM | POA: Diagnosis not present

## 2019-06-08 DIAGNOSIS — N186 End stage renal disease: Secondary | ICD-10-CM | POA: Diagnosis not present

## 2019-06-08 DIAGNOSIS — D509 Iron deficiency anemia, unspecified: Secondary | ICD-10-CM | POA: Diagnosis not present

## 2019-06-08 DIAGNOSIS — D631 Anemia in chronic kidney disease: Secondary | ICD-10-CM | POA: Diagnosis not present

## 2019-06-08 DIAGNOSIS — E876 Hypokalemia: Secondary | ICD-10-CM | POA: Diagnosis not present

## 2019-06-11 DIAGNOSIS — E876 Hypokalemia: Secondary | ICD-10-CM | POA: Diagnosis not present

## 2019-06-11 DIAGNOSIS — N186 End stage renal disease: Secondary | ICD-10-CM | POA: Diagnosis not present

## 2019-06-11 DIAGNOSIS — D509 Iron deficiency anemia, unspecified: Secondary | ICD-10-CM | POA: Diagnosis not present

## 2019-06-11 DIAGNOSIS — D631 Anemia in chronic kidney disease: Secondary | ICD-10-CM | POA: Diagnosis not present

## 2019-06-11 DIAGNOSIS — N2581 Secondary hyperparathyroidism of renal origin: Secondary | ICD-10-CM | POA: Diagnosis not present

## 2019-06-11 DIAGNOSIS — E1122 Type 2 diabetes mellitus with diabetic chronic kidney disease: Secondary | ICD-10-CM | POA: Diagnosis not present

## 2019-06-13 DIAGNOSIS — D631 Anemia in chronic kidney disease: Secondary | ICD-10-CM | POA: Diagnosis not present

## 2019-06-13 DIAGNOSIS — N186 End stage renal disease: Secondary | ICD-10-CM | POA: Diagnosis not present

## 2019-06-13 DIAGNOSIS — E876 Hypokalemia: Secondary | ICD-10-CM | POA: Diagnosis not present

## 2019-06-13 DIAGNOSIS — E1122 Type 2 diabetes mellitus with diabetic chronic kidney disease: Secondary | ICD-10-CM | POA: Diagnosis not present

## 2019-06-13 DIAGNOSIS — D509 Iron deficiency anemia, unspecified: Secondary | ICD-10-CM | POA: Diagnosis not present

## 2019-06-13 DIAGNOSIS — N2581 Secondary hyperparathyroidism of renal origin: Secondary | ICD-10-CM | POA: Diagnosis not present

## 2019-06-15 DIAGNOSIS — N2581 Secondary hyperparathyroidism of renal origin: Secondary | ICD-10-CM | POA: Diagnosis not present

## 2019-06-15 DIAGNOSIS — E876 Hypokalemia: Secondary | ICD-10-CM | POA: Diagnosis not present

## 2019-06-15 DIAGNOSIS — E1122 Type 2 diabetes mellitus with diabetic chronic kidney disease: Secondary | ICD-10-CM | POA: Diagnosis not present

## 2019-06-15 DIAGNOSIS — D631 Anemia in chronic kidney disease: Secondary | ICD-10-CM | POA: Diagnosis not present

## 2019-06-15 DIAGNOSIS — D509 Iron deficiency anemia, unspecified: Secondary | ICD-10-CM | POA: Diagnosis not present

## 2019-06-15 DIAGNOSIS — N186 End stage renal disease: Secondary | ICD-10-CM | POA: Diagnosis not present

## 2019-06-18 DIAGNOSIS — D509 Iron deficiency anemia, unspecified: Secondary | ICD-10-CM | POA: Diagnosis not present

## 2019-06-18 DIAGNOSIS — D631 Anemia in chronic kidney disease: Secondary | ICD-10-CM | POA: Diagnosis not present

## 2019-06-18 DIAGNOSIS — N2581 Secondary hyperparathyroidism of renal origin: Secondary | ICD-10-CM | POA: Diagnosis not present

## 2019-06-18 DIAGNOSIS — E876 Hypokalemia: Secondary | ICD-10-CM | POA: Diagnosis not present

## 2019-06-18 DIAGNOSIS — N186 End stage renal disease: Secondary | ICD-10-CM | POA: Diagnosis not present

## 2019-06-18 DIAGNOSIS — E1122 Type 2 diabetes mellitus with diabetic chronic kidney disease: Secondary | ICD-10-CM | POA: Diagnosis not present

## 2019-06-20 DIAGNOSIS — D631 Anemia in chronic kidney disease: Secondary | ICD-10-CM | POA: Diagnosis not present

## 2019-06-20 DIAGNOSIS — D509 Iron deficiency anemia, unspecified: Secondary | ICD-10-CM | POA: Diagnosis not present

## 2019-06-20 DIAGNOSIS — N186 End stage renal disease: Secondary | ICD-10-CM | POA: Diagnosis not present

## 2019-06-20 DIAGNOSIS — N2581 Secondary hyperparathyroidism of renal origin: Secondary | ICD-10-CM | POA: Diagnosis not present

## 2019-06-20 DIAGNOSIS — E1122 Type 2 diabetes mellitus with diabetic chronic kidney disease: Secondary | ICD-10-CM | POA: Diagnosis not present

## 2019-06-20 DIAGNOSIS — E876 Hypokalemia: Secondary | ICD-10-CM | POA: Diagnosis not present

## 2019-06-22 DIAGNOSIS — N186 End stage renal disease: Secondary | ICD-10-CM | POA: Diagnosis not present

## 2019-06-22 DIAGNOSIS — E1122 Type 2 diabetes mellitus with diabetic chronic kidney disease: Secondary | ICD-10-CM | POA: Diagnosis not present

## 2019-06-22 DIAGNOSIS — E876 Hypokalemia: Secondary | ICD-10-CM | POA: Diagnosis not present

## 2019-06-22 DIAGNOSIS — N2581 Secondary hyperparathyroidism of renal origin: Secondary | ICD-10-CM | POA: Diagnosis not present

## 2019-06-22 DIAGNOSIS — D631 Anemia in chronic kidney disease: Secondary | ICD-10-CM | POA: Diagnosis not present

## 2019-06-22 DIAGNOSIS — D509 Iron deficiency anemia, unspecified: Secondary | ICD-10-CM | POA: Diagnosis not present

## 2019-06-25 DIAGNOSIS — E876 Hypokalemia: Secondary | ICD-10-CM | POA: Diagnosis not present

## 2019-06-25 DIAGNOSIS — D509 Iron deficiency anemia, unspecified: Secondary | ICD-10-CM | POA: Diagnosis not present

## 2019-06-25 DIAGNOSIS — D631 Anemia in chronic kidney disease: Secondary | ICD-10-CM | POA: Diagnosis not present

## 2019-06-25 DIAGNOSIS — E1122 Type 2 diabetes mellitus with diabetic chronic kidney disease: Secondary | ICD-10-CM | POA: Diagnosis not present

## 2019-06-25 DIAGNOSIS — N186 End stage renal disease: Secondary | ICD-10-CM | POA: Diagnosis not present

## 2019-06-25 DIAGNOSIS — N2581 Secondary hyperparathyroidism of renal origin: Secondary | ICD-10-CM | POA: Diagnosis not present

## 2019-06-27 DIAGNOSIS — N2581 Secondary hyperparathyroidism of renal origin: Secondary | ICD-10-CM | POA: Diagnosis not present

## 2019-06-27 DIAGNOSIS — D631 Anemia in chronic kidney disease: Secondary | ICD-10-CM | POA: Diagnosis not present

## 2019-06-27 DIAGNOSIS — N186 End stage renal disease: Secondary | ICD-10-CM | POA: Diagnosis not present

## 2019-06-27 DIAGNOSIS — E1122 Type 2 diabetes mellitus with diabetic chronic kidney disease: Secondary | ICD-10-CM | POA: Diagnosis not present

## 2019-06-27 DIAGNOSIS — E876 Hypokalemia: Secondary | ICD-10-CM | POA: Diagnosis not present

## 2019-06-27 DIAGNOSIS — D509 Iron deficiency anemia, unspecified: Secondary | ICD-10-CM | POA: Diagnosis not present

## 2019-06-29 DIAGNOSIS — D509 Iron deficiency anemia, unspecified: Secondary | ICD-10-CM | POA: Diagnosis not present

## 2019-06-29 DIAGNOSIS — D631 Anemia in chronic kidney disease: Secondary | ICD-10-CM | POA: Diagnosis not present

## 2019-06-29 DIAGNOSIS — N2581 Secondary hyperparathyroidism of renal origin: Secondary | ICD-10-CM | POA: Diagnosis not present

## 2019-06-29 DIAGNOSIS — E876 Hypokalemia: Secondary | ICD-10-CM | POA: Diagnosis not present

## 2019-06-29 DIAGNOSIS — N186 End stage renal disease: Secondary | ICD-10-CM | POA: Diagnosis not present

## 2019-06-29 DIAGNOSIS — E1122 Type 2 diabetes mellitus with diabetic chronic kidney disease: Secondary | ICD-10-CM | POA: Diagnosis not present

## 2019-07-02 DIAGNOSIS — E876 Hypokalemia: Secondary | ICD-10-CM | POA: Diagnosis not present

## 2019-07-02 DIAGNOSIS — N186 End stage renal disease: Secondary | ICD-10-CM | POA: Diagnosis not present

## 2019-07-02 DIAGNOSIS — N2581 Secondary hyperparathyroidism of renal origin: Secondary | ICD-10-CM | POA: Diagnosis not present

## 2019-07-02 DIAGNOSIS — E1122 Type 2 diabetes mellitus with diabetic chronic kidney disease: Secondary | ICD-10-CM | POA: Diagnosis not present

## 2019-07-02 DIAGNOSIS — D509 Iron deficiency anemia, unspecified: Secondary | ICD-10-CM | POA: Diagnosis not present

## 2019-07-02 DIAGNOSIS — D631 Anemia in chronic kidney disease: Secondary | ICD-10-CM | POA: Diagnosis not present

## 2019-07-04 DIAGNOSIS — D631 Anemia in chronic kidney disease: Secondary | ICD-10-CM | POA: Diagnosis not present

## 2019-07-04 DIAGNOSIS — E1122 Type 2 diabetes mellitus with diabetic chronic kidney disease: Secondary | ICD-10-CM | POA: Diagnosis not present

## 2019-07-04 DIAGNOSIS — N186 End stage renal disease: Secondary | ICD-10-CM | POA: Diagnosis not present

## 2019-07-04 DIAGNOSIS — N2581 Secondary hyperparathyroidism of renal origin: Secondary | ICD-10-CM | POA: Diagnosis not present

## 2019-07-04 DIAGNOSIS — E876 Hypokalemia: Secondary | ICD-10-CM | POA: Diagnosis not present

## 2019-07-04 DIAGNOSIS — D509 Iron deficiency anemia, unspecified: Secondary | ICD-10-CM | POA: Diagnosis not present

## 2019-07-06 DIAGNOSIS — E876 Hypokalemia: Secondary | ICD-10-CM | POA: Diagnosis not present

## 2019-07-06 DIAGNOSIS — D509 Iron deficiency anemia, unspecified: Secondary | ICD-10-CM | POA: Diagnosis not present

## 2019-07-06 DIAGNOSIS — E1122 Type 2 diabetes mellitus with diabetic chronic kidney disease: Secondary | ICD-10-CM | POA: Diagnosis not present

## 2019-07-06 DIAGNOSIS — D631 Anemia in chronic kidney disease: Secondary | ICD-10-CM | POA: Diagnosis not present

## 2019-07-06 DIAGNOSIS — N186 End stage renal disease: Secondary | ICD-10-CM | POA: Diagnosis not present

## 2019-07-06 DIAGNOSIS — N2581 Secondary hyperparathyroidism of renal origin: Secondary | ICD-10-CM | POA: Diagnosis not present

## 2019-07-07 DIAGNOSIS — Z992 Dependence on renal dialysis: Secondary | ICD-10-CM | POA: Diagnosis not present

## 2019-07-07 DIAGNOSIS — E1122 Type 2 diabetes mellitus with diabetic chronic kidney disease: Secondary | ICD-10-CM | POA: Diagnosis not present

## 2019-07-07 DIAGNOSIS — N186 End stage renal disease: Secondary | ICD-10-CM | POA: Diagnosis not present

## 2019-07-09 DIAGNOSIS — Z992 Dependence on renal dialysis: Secondary | ICD-10-CM | POA: Diagnosis not present

## 2019-07-09 DIAGNOSIS — E1122 Type 2 diabetes mellitus with diabetic chronic kidney disease: Secondary | ICD-10-CM | POA: Diagnosis not present

## 2019-07-09 DIAGNOSIS — E876 Hypokalemia: Secondary | ICD-10-CM | POA: Diagnosis not present

## 2019-07-09 DIAGNOSIS — D509 Iron deficiency anemia, unspecified: Secondary | ICD-10-CM | POA: Diagnosis not present

## 2019-07-09 DIAGNOSIS — D631 Anemia in chronic kidney disease: Secondary | ICD-10-CM | POA: Diagnosis not present

## 2019-07-09 DIAGNOSIS — N186 End stage renal disease: Secondary | ICD-10-CM | POA: Diagnosis not present

## 2019-07-09 DIAGNOSIS — N2581 Secondary hyperparathyroidism of renal origin: Secondary | ICD-10-CM | POA: Diagnosis not present

## 2019-07-11 ENCOUNTER — Other Ambulatory Visit: Payer: Self-pay | Admitting: Family Medicine

## 2019-07-11 DIAGNOSIS — Z992 Dependence on renal dialysis: Secondary | ICD-10-CM | POA: Diagnosis not present

## 2019-07-11 DIAGNOSIS — E876 Hypokalemia: Secondary | ICD-10-CM | POA: Diagnosis not present

## 2019-07-11 DIAGNOSIS — N186 End stage renal disease: Secondary | ICD-10-CM | POA: Diagnosis not present

## 2019-07-11 DIAGNOSIS — D509 Iron deficiency anemia, unspecified: Secondary | ICD-10-CM | POA: Diagnosis not present

## 2019-07-11 DIAGNOSIS — E1122 Type 2 diabetes mellitus with diabetic chronic kidney disease: Secondary | ICD-10-CM | POA: Diagnosis not present

## 2019-07-11 DIAGNOSIS — D631 Anemia in chronic kidney disease: Secondary | ICD-10-CM | POA: Diagnosis not present

## 2019-07-12 ENCOUNTER — Other Ambulatory Visit: Payer: Self-pay | Admitting: Family Medicine

## 2019-07-13 DIAGNOSIS — E876 Hypokalemia: Secondary | ICD-10-CM | POA: Diagnosis not present

## 2019-07-13 DIAGNOSIS — N186 End stage renal disease: Secondary | ICD-10-CM | POA: Diagnosis not present

## 2019-07-13 DIAGNOSIS — D509 Iron deficiency anemia, unspecified: Secondary | ICD-10-CM | POA: Diagnosis not present

## 2019-07-13 DIAGNOSIS — D631 Anemia in chronic kidney disease: Secondary | ICD-10-CM | POA: Diagnosis not present

## 2019-07-13 DIAGNOSIS — Z992 Dependence on renal dialysis: Secondary | ICD-10-CM | POA: Diagnosis not present

## 2019-07-13 DIAGNOSIS — E1122 Type 2 diabetes mellitus with diabetic chronic kidney disease: Secondary | ICD-10-CM | POA: Diagnosis not present

## 2019-07-16 DIAGNOSIS — D509 Iron deficiency anemia, unspecified: Secondary | ICD-10-CM | POA: Diagnosis not present

## 2019-07-16 DIAGNOSIS — N186 End stage renal disease: Secondary | ICD-10-CM | POA: Diagnosis not present

## 2019-07-16 DIAGNOSIS — E876 Hypokalemia: Secondary | ICD-10-CM | POA: Diagnosis not present

## 2019-07-16 DIAGNOSIS — D631 Anemia in chronic kidney disease: Secondary | ICD-10-CM | POA: Diagnosis not present

## 2019-07-16 DIAGNOSIS — Z992 Dependence on renal dialysis: Secondary | ICD-10-CM | POA: Diagnosis not present

## 2019-07-16 DIAGNOSIS — E1122 Type 2 diabetes mellitus with diabetic chronic kidney disease: Secondary | ICD-10-CM | POA: Diagnosis not present

## 2019-07-18 DIAGNOSIS — D509 Iron deficiency anemia, unspecified: Secondary | ICD-10-CM | POA: Diagnosis not present

## 2019-07-18 DIAGNOSIS — N186 End stage renal disease: Secondary | ICD-10-CM | POA: Diagnosis not present

## 2019-07-18 DIAGNOSIS — E876 Hypokalemia: Secondary | ICD-10-CM | POA: Diagnosis not present

## 2019-07-18 DIAGNOSIS — D631 Anemia in chronic kidney disease: Secondary | ICD-10-CM | POA: Diagnosis not present

## 2019-07-18 DIAGNOSIS — Z992 Dependence on renal dialysis: Secondary | ICD-10-CM | POA: Diagnosis not present

## 2019-07-18 DIAGNOSIS — E1122 Type 2 diabetes mellitus with diabetic chronic kidney disease: Secondary | ICD-10-CM | POA: Diagnosis not present

## 2019-07-20 DIAGNOSIS — E876 Hypokalemia: Secondary | ICD-10-CM | POA: Diagnosis not present

## 2019-07-20 DIAGNOSIS — E1122 Type 2 diabetes mellitus with diabetic chronic kidney disease: Secondary | ICD-10-CM | POA: Diagnosis not present

## 2019-07-20 DIAGNOSIS — D631 Anemia in chronic kidney disease: Secondary | ICD-10-CM | POA: Diagnosis not present

## 2019-07-20 DIAGNOSIS — N186 End stage renal disease: Secondary | ICD-10-CM | POA: Diagnosis not present

## 2019-07-20 DIAGNOSIS — D509 Iron deficiency anemia, unspecified: Secondary | ICD-10-CM | POA: Diagnosis not present

## 2019-07-20 DIAGNOSIS — Z992 Dependence on renal dialysis: Secondary | ICD-10-CM | POA: Diagnosis not present

## 2019-07-23 DIAGNOSIS — E1122 Type 2 diabetes mellitus with diabetic chronic kidney disease: Secondary | ICD-10-CM | POA: Diagnosis not present

## 2019-07-23 DIAGNOSIS — D631 Anemia in chronic kidney disease: Secondary | ICD-10-CM | POA: Diagnosis not present

## 2019-07-23 DIAGNOSIS — N186 End stage renal disease: Secondary | ICD-10-CM | POA: Diagnosis not present

## 2019-07-23 DIAGNOSIS — D509 Iron deficiency anemia, unspecified: Secondary | ICD-10-CM | POA: Diagnosis not present

## 2019-07-23 DIAGNOSIS — Z992 Dependence on renal dialysis: Secondary | ICD-10-CM | POA: Diagnosis not present

## 2019-07-23 DIAGNOSIS — E876 Hypokalemia: Secondary | ICD-10-CM | POA: Diagnosis not present

## 2019-07-25 DIAGNOSIS — E876 Hypokalemia: Secondary | ICD-10-CM | POA: Diagnosis not present

## 2019-07-25 DIAGNOSIS — D509 Iron deficiency anemia, unspecified: Secondary | ICD-10-CM | POA: Diagnosis not present

## 2019-07-25 DIAGNOSIS — D631 Anemia in chronic kidney disease: Secondary | ICD-10-CM | POA: Diagnosis not present

## 2019-07-25 DIAGNOSIS — N186 End stage renal disease: Secondary | ICD-10-CM | POA: Diagnosis not present

## 2019-07-25 DIAGNOSIS — Z992 Dependence on renal dialysis: Secondary | ICD-10-CM | POA: Diagnosis not present

## 2019-07-25 DIAGNOSIS — E1122 Type 2 diabetes mellitus with diabetic chronic kidney disease: Secondary | ICD-10-CM | POA: Diagnosis not present

## 2019-07-27 DIAGNOSIS — E1122 Type 2 diabetes mellitus with diabetic chronic kidney disease: Secondary | ICD-10-CM | POA: Diagnosis not present

## 2019-07-27 DIAGNOSIS — D509 Iron deficiency anemia, unspecified: Secondary | ICD-10-CM | POA: Diagnosis not present

## 2019-07-27 DIAGNOSIS — Z992 Dependence on renal dialysis: Secondary | ICD-10-CM | POA: Diagnosis not present

## 2019-07-27 DIAGNOSIS — D631 Anemia in chronic kidney disease: Secondary | ICD-10-CM | POA: Diagnosis not present

## 2019-07-27 DIAGNOSIS — N186 End stage renal disease: Secondary | ICD-10-CM | POA: Diagnosis not present

## 2019-07-27 DIAGNOSIS — E876 Hypokalemia: Secondary | ICD-10-CM | POA: Diagnosis not present

## 2019-07-30 DIAGNOSIS — E1122 Type 2 diabetes mellitus with diabetic chronic kidney disease: Secondary | ICD-10-CM | POA: Diagnosis not present

## 2019-07-30 DIAGNOSIS — D631 Anemia in chronic kidney disease: Secondary | ICD-10-CM | POA: Diagnosis not present

## 2019-07-30 DIAGNOSIS — D509 Iron deficiency anemia, unspecified: Secondary | ICD-10-CM | POA: Diagnosis not present

## 2019-07-30 DIAGNOSIS — Z992 Dependence on renal dialysis: Secondary | ICD-10-CM | POA: Diagnosis not present

## 2019-07-30 DIAGNOSIS — N186 End stage renal disease: Secondary | ICD-10-CM | POA: Diagnosis not present

## 2019-07-30 DIAGNOSIS — E876 Hypokalemia: Secondary | ICD-10-CM | POA: Diagnosis not present

## 2019-08-01 DIAGNOSIS — N186 End stage renal disease: Secondary | ICD-10-CM | POA: Diagnosis not present

## 2019-08-01 DIAGNOSIS — D509 Iron deficiency anemia, unspecified: Secondary | ICD-10-CM | POA: Diagnosis not present

## 2019-08-01 DIAGNOSIS — E876 Hypokalemia: Secondary | ICD-10-CM | POA: Diagnosis not present

## 2019-08-01 DIAGNOSIS — D631 Anemia in chronic kidney disease: Secondary | ICD-10-CM | POA: Diagnosis not present

## 2019-08-01 DIAGNOSIS — Z992 Dependence on renal dialysis: Secondary | ICD-10-CM | POA: Diagnosis not present

## 2019-08-01 DIAGNOSIS — E1122 Type 2 diabetes mellitus with diabetic chronic kidney disease: Secondary | ICD-10-CM | POA: Diagnosis not present

## 2019-08-03 DIAGNOSIS — D509 Iron deficiency anemia, unspecified: Secondary | ICD-10-CM | POA: Diagnosis not present

## 2019-08-03 DIAGNOSIS — Z992 Dependence on renal dialysis: Secondary | ICD-10-CM | POA: Diagnosis not present

## 2019-08-03 DIAGNOSIS — D631 Anemia in chronic kidney disease: Secondary | ICD-10-CM | POA: Diagnosis not present

## 2019-08-03 DIAGNOSIS — E1122 Type 2 diabetes mellitus with diabetic chronic kidney disease: Secondary | ICD-10-CM | POA: Diagnosis not present

## 2019-08-03 DIAGNOSIS — E876 Hypokalemia: Secondary | ICD-10-CM | POA: Diagnosis not present

## 2019-08-03 DIAGNOSIS — N186 End stage renal disease: Secondary | ICD-10-CM | POA: Diagnosis not present

## 2019-08-06 DIAGNOSIS — Z992 Dependence on renal dialysis: Secondary | ICD-10-CM | POA: Diagnosis not present

## 2019-08-06 DIAGNOSIS — N186 End stage renal disease: Secondary | ICD-10-CM | POA: Diagnosis not present

## 2019-08-06 DIAGNOSIS — E1122 Type 2 diabetes mellitus with diabetic chronic kidney disease: Secondary | ICD-10-CM | POA: Diagnosis not present

## 2019-08-06 DIAGNOSIS — D631 Anemia in chronic kidney disease: Secondary | ICD-10-CM | POA: Diagnosis not present

## 2019-08-06 DIAGNOSIS — D509 Iron deficiency anemia, unspecified: Secondary | ICD-10-CM | POA: Diagnosis not present

## 2019-08-06 DIAGNOSIS — E876 Hypokalemia: Secondary | ICD-10-CM | POA: Diagnosis not present

## 2019-08-07 DIAGNOSIS — N186 End stage renal disease: Secondary | ICD-10-CM | POA: Diagnosis not present

## 2019-08-07 DIAGNOSIS — E1122 Type 2 diabetes mellitus with diabetic chronic kidney disease: Secondary | ICD-10-CM | POA: Diagnosis not present

## 2019-08-07 DIAGNOSIS — Z992 Dependence on renal dialysis: Secondary | ICD-10-CM | POA: Diagnosis not present

## 2019-08-08 DIAGNOSIS — E876 Hypokalemia: Secondary | ICD-10-CM | POA: Diagnosis not present

## 2019-08-08 DIAGNOSIS — Z992 Dependence on renal dialysis: Secondary | ICD-10-CM | POA: Diagnosis not present

## 2019-08-08 DIAGNOSIS — N186 End stage renal disease: Secondary | ICD-10-CM | POA: Diagnosis not present

## 2019-08-08 DIAGNOSIS — E1122 Type 2 diabetes mellitus with diabetic chronic kidney disease: Secondary | ICD-10-CM | POA: Diagnosis not present

## 2019-08-08 DIAGNOSIS — D509 Iron deficiency anemia, unspecified: Secondary | ICD-10-CM | POA: Diagnosis not present

## 2019-08-08 DIAGNOSIS — Z23 Encounter for immunization: Secondary | ICD-10-CM | POA: Diagnosis not present

## 2019-08-08 DIAGNOSIS — N2581 Secondary hyperparathyroidism of renal origin: Secondary | ICD-10-CM | POA: Diagnosis not present

## 2019-08-08 DIAGNOSIS — D631 Anemia in chronic kidney disease: Secondary | ICD-10-CM | POA: Diagnosis not present

## 2019-08-09 ENCOUNTER — Other Ambulatory Visit: Payer: Self-pay | Admitting: Family Medicine

## 2019-08-10 DIAGNOSIS — Z23 Encounter for immunization: Secondary | ICD-10-CM | POA: Diagnosis not present

## 2019-08-10 DIAGNOSIS — N186 End stage renal disease: Secondary | ICD-10-CM | POA: Diagnosis not present

## 2019-08-10 DIAGNOSIS — Z992 Dependence on renal dialysis: Secondary | ICD-10-CM | POA: Diagnosis not present

## 2019-08-10 DIAGNOSIS — E1122 Type 2 diabetes mellitus with diabetic chronic kidney disease: Secondary | ICD-10-CM | POA: Diagnosis not present

## 2019-08-10 DIAGNOSIS — E876 Hypokalemia: Secondary | ICD-10-CM | POA: Diagnosis not present

## 2019-08-10 DIAGNOSIS — D509 Iron deficiency anemia, unspecified: Secondary | ICD-10-CM | POA: Diagnosis not present

## 2019-08-11 NOTE — Telephone Encounter (Signed)
Refill for gabapentin provided. Patient needs appointment to discuss diabetes. Please help her make appointment. Thanks!

## 2019-08-13 DIAGNOSIS — N186 End stage renal disease: Secondary | ICD-10-CM | POA: Diagnosis not present

## 2019-08-13 DIAGNOSIS — D509 Iron deficiency anemia, unspecified: Secondary | ICD-10-CM | POA: Diagnosis not present

## 2019-08-13 DIAGNOSIS — Z23 Encounter for immunization: Secondary | ICD-10-CM | POA: Diagnosis not present

## 2019-08-13 DIAGNOSIS — E876 Hypokalemia: Secondary | ICD-10-CM | POA: Diagnosis not present

## 2019-08-13 DIAGNOSIS — Z992 Dependence on renal dialysis: Secondary | ICD-10-CM | POA: Diagnosis not present

## 2019-08-13 DIAGNOSIS — E1122 Type 2 diabetes mellitus with diabetic chronic kidney disease: Secondary | ICD-10-CM | POA: Diagnosis not present

## 2019-08-14 NOTE — Telephone Encounter (Signed)
appt made for 08-27-2019. Tarek Cravens,CMA

## 2019-08-15 DIAGNOSIS — D509 Iron deficiency anemia, unspecified: Secondary | ICD-10-CM | POA: Diagnosis not present

## 2019-08-15 DIAGNOSIS — E1122 Type 2 diabetes mellitus with diabetic chronic kidney disease: Secondary | ICD-10-CM | POA: Diagnosis not present

## 2019-08-15 DIAGNOSIS — Z23 Encounter for immunization: Secondary | ICD-10-CM | POA: Diagnosis not present

## 2019-08-15 DIAGNOSIS — E876 Hypokalemia: Secondary | ICD-10-CM | POA: Diagnosis not present

## 2019-08-15 DIAGNOSIS — Z992 Dependence on renal dialysis: Secondary | ICD-10-CM | POA: Diagnosis not present

## 2019-08-15 DIAGNOSIS — N186 End stage renal disease: Secondary | ICD-10-CM | POA: Diagnosis not present

## 2019-08-17 DIAGNOSIS — E876 Hypokalemia: Secondary | ICD-10-CM | POA: Diagnosis not present

## 2019-08-17 DIAGNOSIS — Z23 Encounter for immunization: Secondary | ICD-10-CM | POA: Diagnosis not present

## 2019-08-17 DIAGNOSIS — N186 End stage renal disease: Secondary | ICD-10-CM | POA: Diagnosis not present

## 2019-08-17 DIAGNOSIS — D509 Iron deficiency anemia, unspecified: Secondary | ICD-10-CM | POA: Diagnosis not present

## 2019-08-17 DIAGNOSIS — Z992 Dependence on renal dialysis: Secondary | ICD-10-CM | POA: Diagnosis not present

## 2019-08-17 DIAGNOSIS — E1122 Type 2 diabetes mellitus with diabetic chronic kidney disease: Secondary | ICD-10-CM | POA: Diagnosis not present

## 2019-08-20 DIAGNOSIS — N186 End stage renal disease: Secondary | ICD-10-CM | POA: Diagnosis not present

## 2019-08-20 DIAGNOSIS — E876 Hypokalemia: Secondary | ICD-10-CM | POA: Diagnosis not present

## 2019-08-20 DIAGNOSIS — Z23 Encounter for immunization: Secondary | ICD-10-CM | POA: Diagnosis not present

## 2019-08-20 DIAGNOSIS — Z992 Dependence on renal dialysis: Secondary | ICD-10-CM | POA: Diagnosis not present

## 2019-08-20 DIAGNOSIS — D509 Iron deficiency anemia, unspecified: Secondary | ICD-10-CM | POA: Diagnosis not present

## 2019-08-20 DIAGNOSIS — E1122 Type 2 diabetes mellitus with diabetic chronic kidney disease: Secondary | ICD-10-CM | POA: Diagnosis not present

## 2019-08-22 DIAGNOSIS — Z23 Encounter for immunization: Secondary | ICD-10-CM | POA: Diagnosis not present

## 2019-08-22 DIAGNOSIS — N186 End stage renal disease: Secondary | ICD-10-CM | POA: Diagnosis not present

## 2019-08-22 DIAGNOSIS — D509 Iron deficiency anemia, unspecified: Secondary | ICD-10-CM | POA: Diagnosis not present

## 2019-08-22 DIAGNOSIS — Z992 Dependence on renal dialysis: Secondary | ICD-10-CM | POA: Diagnosis not present

## 2019-08-22 DIAGNOSIS — E876 Hypokalemia: Secondary | ICD-10-CM | POA: Diagnosis not present

## 2019-08-22 DIAGNOSIS — E1122 Type 2 diabetes mellitus with diabetic chronic kidney disease: Secondary | ICD-10-CM | POA: Diagnosis not present

## 2019-08-24 DIAGNOSIS — E876 Hypokalemia: Secondary | ICD-10-CM | POA: Diagnosis not present

## 2019-08-24 DIAGNOSIS — E1122 Type 2 diabetes mellitus with diabetic chronic kidney disease: Secondary | ICD-10-CM | POA: Diagnosis not present

## 2019-08-24 DIAGNOSIS — D509 Iron deficiency anemia, unspecified: Secondary | ICD-10-CM | POA: Diagnosis not present

## 2019-08-24 DIAGNOSIS — Z992 Dependence on renal dialysis: Secondary | ICD-10-CM | POA: Diagnosis not present

## 2019-08-24 DIAGNOSIS — Z23 Encounter for immunization: Secondary | ICD-10-CM | POA: Diagnosis not present

## 2019-08-24 DIAGNOSIS — N186 End stage renal disease: Secondary | ICD-10-CM | POA: Diagnosis not present

## 2019-08-27 ENCOUNTER — Ambulatory Visit: Payer: Medicare Other | Admitting: Family Medicine

## 2019-08-27 DIAGNOSIS — E1122 Type 2 diabetes mellitus with diabetic chronic kidney disease: Secondary | ICD-10-CM | POA: Diagnosis not present

## 2019-08-27 DIAGNOSIS — N186 End stage renal disease: Secondary | ICD-10-CM | POA: Diagnosis not present

## 2019-08-27 DIAGNOSIS — Z23 Encounter for immunization: Secondary | ICD-10-CM | POA: Diagnosis not present

## 2019-08-27 DIAGNOSIS — D509 Iron deficiency anemia, unspecified: Secondary | ICD-10-CM | POA: Diagnosis not present

## 2019-08-27 DIAGNOSIS — E876 Hypokalemia: Secondary | ICD-10-CM | POA: Diagnosis not present

## 2019-08-27 DIAGNOSIS — Z992 Dependence on renal dialysis: Secondary | ICD-10-CM | POA: Diagnosis not present

## 2019-08-28 ENCOUNTER — Ambulatory Visit: Payer: Medicare Other | Admitting: Family Medicine

## 2019-08-28 ENCOUNTER — Encounter: Payer: Self-pay | Admitting: Family Medicine

## 2019-08-28 NOTE — Progress Notes (Deleted)
  Subjective:   Patient ID: [name]    DOB: 1966/09/01, 53 y.o. female   MRN: [id number]  [name] is a 53 y.o. female with a history of HTN, H/O liver abscess, T2 DM with peripheral neuropathy, ESRD on HD, H/O enterococcal bacteremia, HLD, legally blind, s/p right BKA, H/oh alcohol use disorder here for   Diabetes, Type 2 - Last A1c 8.4 12/2018 - Medications: levemir 10u daily,  - Compliance: *** - Checking BG at home: *** - Diet: *** - Exercise: *** - Eye exam: UTD - Foot exam: due - Microalbumin: n/a, has ESRD - Statin: yes - Denies symptoms of hypoglycemia, polyuria, polydipsia, numbness extremities, foot ulcers/trauma  Hypertension: - Medications: Amlodipine 10 mg, losartan 50 mg - Compliance: *** - Checking BP at home: *** - Denies any SOB, CP, vision changes, LE edema, medication SEs, or symptoms of hypotension - Diet: *** - Exercise: ***  ***due for pap smear  Review of Systems:  Per HPI.  Medications and smoking status reviewed.  Objective:   There were no vitals taken for this visit. Vitals and nursing note reviewed.  General: well nourished, well developed, in no acute distress with non-toxic appearance HEENT: normocephalic, atraumatic, moist mucous membranes Neck: supple, non-tender without lymphadenopathy CV: regular rate and rhythm without murmurs, rubs, or gallops, no lower extremity edema Lungs: clear to auscultation bilaterally with normal work of breathing Abdomen: soft, non-tender, non-distended, no masses or organomegaly palpable, normoactive bowel sounds Skin: warm, dry, no rashes or lesions Extremities: warm and well perfused, normal tone MSK: ROM grossly intact, gait normal Neuro: Alert and oriented, speech normal  Assessment & Plan:   No problem-specific Assessment & Plan notes found for this encounter.  No orders of the defined types were placed in this encounter.  No orders of the defined types were placed in this  encounter.   Rory Percy, DO PGY-3, [facility] 08/28/2019 8:34 AM

## 2019-08-29 DIAGNOSIS — Z23 Encounter for immunization: Secondary | ICD-10-CM | POA: Diagnosis not present

## 2019-08-29 DIAGNOSIS — D509 Iron deficiency anemia, unspecified: Secondary | ICD-10-CM | POA: Diagnosis not present

## 2019-08-29 DIAGNOSIS — Z992 Dependence on renal dialysis: Secondary | ICD-10-CM | POA: Diagnosis not present

## 2019-08-29 DIAGNOSIS — E876 Hypokalemia: Secondary | ICD-10-CM | POA: Diagnosis not present

## 2019-08-29 DIAGNOSIS — N186 End stage renal disease: Secondary | ICD-10-CM | POA: Diagnosis not present

## 2019-08-29 DIAGNOSIS — E1122 Type 2 diabetes mellitus with diabetic chronic kidney disease: Secondary | ICD-10-CM | POA: Diagnosis not present

## 2019-08-31 DIAGNOSIS — Z23 Encounter for immunization: Secondary | ICD-10-CM | POA: Diagnosis not present

## 2019-08-31 DIAGNOSIS — D509 Iron deficiency anemia, unspecified: Secondary | ICD-10-CM | POA: Diagnosis not present

## 2019-08-31 DIAGNOSIS — E876 Hypokalemia: Secondary | ICD-10-CM | POA: Diagnosis not present

## 2019-08-31 DIAGNOSIS — N186 End stage renal disease: Secondary | ICD-10-CM | POA: Diagnosis not present

## 2019-08-31 DIAGNOSIS — E1122 Type 2 diabetes mellitus with diabetic chronic kidney disease: Secondary | ICD-10-CM | POA: Diagnosis not present

## 2019-08-31 DIAGNOSIS — Z992 Dependence on renal dialysis: Secondary | ICD-10-CM | POA: Diagnosis not present

## 2019-09-03 DIAGNOSIS — Z23 Encounter for immunization: Secondary | ICD-10-CM | POA: Diagnosis not present

## 2019-09-03 DIAGNOSIS — E1122 Type 2 diabetes mellitus with diabetic chronic kidney disease: Secondary | ICD-10-CM | POA: Diagnosis not present

## 2019-09-03 DIAGNOSIS — D509 Iron deficiency anemia, unspecified: Secondary | ICD-10-CM | POA: Diagnosis not present

## 2019-09-03 DIAGNOSIS — N186 End stage renal disease: Secondary | ICD-10-CM | POA: Diagnosis not present

## 2019-09-03 DIAGNOSIS — Z992 Dependence on renal dialysis: Secondary | ICD-10-CM | POA: Diagnosis not present

## 2019-09-03 DIAGNOSIS — E876 Hypokalemia: Secondary | ICD-10-CM | POA: Diagnosis not present

## 2019-09-05 ENCOUNTER — Other Ambulatory Visit: Payer: Self-pay | Admitting: Family Medicine

## 2019-09-06 DIAGNOSIS — Z992 Dependence on renal dialysis: Secondary | ICD-10-CM | POA: Diagnosis not present

## 2019-09-06 DIAGNOSIS — N186 End stage renal disease: Secondary | ICD-10-CM | POA: Diagnosis not present

## 2019-09-06 DIAGNOSIS — E1122 Type 2 diabetes mellitus with diabetic chronic kidney disease: Secondary | ICD-10-CM | POA: Diagnosis not present

## 2019-09-06 NOTE — Telephone Encounter (Signed)
Please have patient make appointment to follow up on diabetes and blood pressure. Refills sent.

## 2019-09-07 DIAGNOSIS — N186 End stage renal disease: Secondary | ICD-10-CM | POA: Diagnosis not present

## 2019-09-07 DIAGNOSIS — E1122 Type 2 diabetes mellitus with diabetic chronic kidney disease: Secondary | ICD-10-CM | POA: Diagnosis not present

## 2019-09-07 DIAGNOSIS — Z992 Dependence on renal dialysis: Secondary | ICD-10-CM | POA: Diagnosis not present

## 2019-09-07 DIAGNOSIS — N2581 Secondary hyperparathyroidism of renal origin: Secondary | ICD-10-CM | POA: Diagnosis not present

## 2019-09-07 DIAGNOSIS — D631 Anemia in chronic kidney disease: Secondary | ICD-10-CM | POA: Diagnosis not present

## 2019-09-07 DIAGNOSIS — E876 Hypokalemia: Secondary | ICD-10-CM | POA: Diagnosis not present

## 2019-09-10 DIAGNOSIS — N2581 Secondary hyperparathyroidism of renal origin: Secondary | ICD-10-CM | POA: Diagnosis not present

## 2019-09-10 DIAGNOSIS — E1122 Type 2 diabetes mellitus with diabetic chronic kidney disease: Secondary | ICD-10-CM | POA: Diagnosis not present

## 2019-09-10 DIAGNOSIS — Z992 Dependence on renal dialysis: Secondary | ICD-10-CM | POA: Diagnosis not present

## 2019-09-10 DIAGNOSIS — D631 Anemia in chronic kidney disease: Secondary | ICD-10-CM | POA: Diagnosis not present

## 2019-09-10 DIAGNOSIS — E876 Hypokalemia: Secondary | ICD-10-CM | POA: Diagnosis not present

## 2019-09-10 DIAGNOSIS — N186 End stage renal disease: Secondary | ICD-10-CM | POA: Diagnosis not present

## 2019-09-12 DIAGNOSIS — E876 Hypokalemia: Secondary | ICD-10-CM | POA: Diagnosis not present

## 2019-09-12 DIAGNOSIS — Z992 Dependence on renal dialysis: Secondary | ICD-10-CM | POA: Diagnosis not present

## 2019-09-12 DIAGNOSIS — N186 End stage renal disease: Secondary | ICD-10-CM | POA: Diagnosis not present

## 2019-09-12 DIAGNOSIS — N2581 Secondary hyperparathyroidism of renal origin: Secondary | ICD-10-CM | POA: Diagnosis not present

## 2019-09-12 DIAGNOSIS — D631 Anemia in chronic kidney disease: Secondary | ICD-10-CM | POA: Diagnosis not present

## 2019-09-12 DIAGNOSIS — E1122 Type 2 diabetes mellitus with diabetic chronic kidney disease: Secondary | ICD-10-CM | POA: Diagnosis not present

## 2019-09-14 DIAGNOSIS — D631 Anemia in chronic kidney disease: Secondary | ICD-10-CM | POA: Diagnosis not present

## 2019-09-14 DIAGNOSIS — E876 Hypokalemia: Secondary | ICD-10-CM | POA: Diagnosis not present

## 2019-09-14 DIAGNOSIS — Z992 Dependence on renal dialysis: Secondary | ICD-10-CM | POA: Diagnosis not present

## 2019-09-14 DIAGNOSIS — N2581 Secondary hyperparathyroidism of renal origin: Secondary | ICD-10-CM | POA: Diagnosis not present

## 2019-09-14 DIAGNOSIS — N186 End stage renal disease: Secondary | ICD-10-CM | POA: Diagnosis not present

## 2019-09-14 DIAGNOSIS — E1122 Type 2 diabetes mellitus with diabetic chronic kidney disease: Secondary | ICD-10-CM | POA: Diagnosis not present

## 2019-09-17 DIAGNOSIS — D631 Anemia in chronic kidney disease: Secondary | ICD-10-CM | POA: Diagnosis not present

## 2019-09-17 DIAGNOSIS — E876 Hypokalemia: Secondary | ICD-10-CM | POA: Diagnosis not present

## 2019-09-17 DIAGNOSIS — E1122 Type 2 diabetes mellitus with diabetic chronic kidney disease: Secondary | ICD-10-CM | POA: Diagnosis not present

## 2019-09-17 DIAGNOSIS — Z992 Dependence on renal dialysis: Secondary | ICD-10-CM | POA: Diagnosis not present

## 2019-09-17 DIAGNOSIS — N2581 Secondary hyperparathyroidism of renal origin: Secondary | ICD-10-CM | POA: Diagnosis not present

## 2019-09-17 DIAGNOSIS — N186 End stage renal disease: Secondary | ICD-10-CM | POA: Diagnosis not present

## 2019-09-18 ENCOUNTER — Other Ambulatory Visit: Payer: Self-pay

## 2019-09-18 ENCOUNTER — Encounter: Payer: Self-pay | Admitting: Family Medicine

## 2019-09-18 ENCOUNTER — Ambulatory Visit (INDEPENDENT_AMBULATORY_CARE_PROVIDER_SITE_OTHER): Payer: Medicare Other | Admitting: Family Medicine

## 2019-09-18 VITALS — BP 116/75 | HR 88 | Temp 98.5°F | Wt 200.0 lb

## 2019-09-18 DIAGNOSIS — R195 Other fecal abnormalities: Secondary | ICD-10-CM

## 2019-09-18 DIAGNOSIS — I10 Essential (primary) hypertension: Secondary | ICD-10-CM

## 2019-09-18 DIAGNOSIS — Z0000 Encounter for general adult medical examination without abnormal findings: Secondary | ICD-10-CM | POA: Diagnosis not present

## 2019-09-18 DIAGNOSIS — Z1211 Encounter for screening for malignant neoplasm of colon: Secondary | ICD-10-CM

## 2019-09-18 DIAGNOSIS — Z794 Long term (current) use of insulin: Secondary | ICD-10-CM

## 2019-09-18 DIAGNOSIS — E1139 Type 2 diabetes mellitus with other diabetic ophthalmic complication: Secondary | ICD-10-CM

## 2019-09-18 DIAGNOSIS — Z1231 Encounter for screening mammogram for malignant neoplasm of breast: Secondary | ICD-10-CM

## 2019-09-18 MED ORDER — ONETOUCH VERIO W/DEVICE KIT: 1.0000 | PACK | Freq: Two times a day (BID) | 0 refills | Status: DC

## 2019-09-18 MED ORDER — ONETOUCH ULTRASOFT LANCETS MISC: 12 refills | Status: DC

## 2019-09-18 NOTE — Patient Instructions (Signed)
It was great to see you!  Our plans for today:  - Continue your 70/30 insulin as you have been doing for now. We will call you with the results of your sugar test. - We referred you to the GI doctor to update your colonoscopy. - Come to the clinic for an appointment to update your pap smear. This appointment will be on 10/11/19 at 10am. - We will let the kidney doctors manage your blood pressure medications but if you get dizzy, let us know. - We also referred you for a mammogram today. - We sent a blood sugar meter to your pharmacy. - Come back in 3 months.  We are checking some labs today, we will call you or send you a letter if they are abnormal.   Take care and seek immediate care sooner if you develop any concerns.   Dr. [facility]

## 2019-09-18 NOTE — Progress Notes (Signed)
Subjective:   Patient ID: PEARSON REASONS    DOB: August 11, 1966, 53 y.o. female   MRN: [id number]  [name] is a 53 y.o. female with a history of HTN, h/o liver abscess, T2DM, ESRD on HD MWF, HLD, legally blind, mod protein-calorie malnutrition, s/p R BKA here for   Diabetes, Type 2 - Last A1c 8.4 12/2018 - Medications: levemir 10u daily  - Compliance: hasn't been taking levemir, has had difficulties affording. Taking humulin 70/30 8u BID - Checking BG at home: no, needs meter - Diet: eats 3 meals per day. Eats oatmeal or grits with eggs or sausage or liver pudding for breakfast. Lunch is a sandwich or chicken salad. Dinner is baked meat, rice, maybe mashed potatoes or mac and cheese. Likes cabbage, green beans, corn, beans. Eats cake every now and then.  - Exercise: wheelchair bound - Eye exam: UTD - Foot exam: due - Microalbumin: n/a - Statin: yes - Denies symptoms of hypoglycemia, no wounds of foot. - UTD with pneumococcal vaccine.  Got flu vaccine at dialysis.  Hypertension: - Medications: norvasc, losartan - Compliance: also reports she is on metoprolol although this was discontinued at prior hospitalization 12/2018. Per chart review, was prescribed by Nephro in January. - Checking BP at home: Fluctuates 110s-140s, gets checked at HD - Denies any SOB, CP, LE edema, medication SEs, or symptoms of hypotension - Diet: see above - Exercise: see above - largely managed by Nephrology.  Health Maintenance - Never had colonoscopy. Previously with dark bowel movements. Instructed to f/u with GI at last hospitalization in 12/2018. No N/V. No BRBPR.  - due for pap smear. States she thinks she previously had one but has been many years.   Review of Systems:  Per HPI.  Medications and smoking status reviewed.  Objective:   BP 116/75   Pulse 88   Temp 98.5 F (36.9 C) (Oral)   Wt 200 lb (90.7 kg)   LMP 03/07/2019   SpO2 93%   BMI 31.32 kg/m  Vitals and nursing note  reviewed.  General: Overweight elderly female in wheelchair, in no acute distress with non-toxic appearance CV: regular rate and rhythm without murmurs, rubs, or gallops, no lower extremity edema Lungs: clear to auscultation bilaterally with normal work of breathing Abdomen: soft, non-tender, non-distended, no masses or organomegaly palpable, normoactive bowel sounds Skin: warm, dry, no rashes or lesions Extremities: warm and well perfused, normal tone MSK: ROM grossly intact, gait normal Neuro: Alert and oriented, speech normal  Assessment & Plan:   Type 2 diabetes mellitus (HCC) Tolerating Humulin 70/30 insulin 8u BID ok.  Will obtain fructosamine given ESRD status and adjust as needed.  Prescription sent for glucose meter and supplies.  No current need for insulin refills.  Foot exam performed today though without monofilament.  Follow-up will depend on fructosamine levels.  Would need to obtain updated lipid panel at next visit.  Hypertension Largely managed by nephrology.  Low normal reading today though does have some higher reports at HD.  Will defer further management to nephrology.  Healthcare maintenance Made appointment to update Pap smear in GYN clinic.  Referral placed to GI for updated colonoscopy.  Order placed to update mammogram.  Previously received flu and pneumococcal vaccines.  Orders Placed This Encounter  Procedures  . MM DIGITAL SCREENING BILATERAL    Standing Status:   Future    Standing Expiration Date:   09/17/2020    Order Specific Question:   Reason for Exam (SYMPTOM  OR DIAGNOSIS REQUIRED)    Answer:   screening for breast cancer    Order Specific Question:   Is the patient pregnant?    Answer:   No    Order Specific Question:   Preferred imaging location?    Answer:   [facility]  . Fructosamine  . CBC  . Ambulatory referral to Gastroenterology    Referral Priority:   Routine    Referral Type:   Consultation    Referral Reason:   Specialty  Services Required    Number of Visits Requested:   1   Meds ordered this encounter  Medications  . Blood Glucose Monitoring Suppl (ONETOUCH VERIO) w/Device KIT    Sig: 1 each by Does not apply route 2 (two) times daily before a meal.    Dispense:  1 kit    Refill:  0  . Lancets (ONETOUCH ULTRASOFT) lancets    Sig: Use as instructed    Dispense:  100 each    Refill:  [place], DO PGY-3, [facility] 09/18/2019 5:38 PM

## 2019-09-18 NOTE — Assessment & Plan Note (Addendum)
Tolerating Humulin 70/30 insulin 8u BID ok.  Will obtain fructosamine given ESRD status and adjust as needed.  Prescription sent for glucose meter and supplies.  No current need for insulin refills.  Foot exam performed today though without monofilament.  Follow-up will depend on fructosamine levels.  Would need to obtain updated lipid panel at next visit.

## 2019-09-18 NOTE — Assessment & Plan Note (Signed)
Largely managed by nephrology.  Low normal reading today though does have some higher reports at HD.  Will defer further management to nephrology.

## 2019-09-18 NOTE — Assessment & Plan Note (Addendum)
Made appointment to update Pap smear in GYN clinic.  Referral placed to GI for updated colonoscopy.  Order placed to update mammogram.  Previously received flu and pneumococcal vaccines.

## 2019-09-19 DIAGNOSIS — Z992 Dependence on renal dialysis: Secondary | ICD-10-CM | POA: Diagnosis not present

## 2019-09-19 DIAGNOSIS — E1122 Type 2 diabetes mellitus with diabetic chronic kidney disease: Secondary | ICD-10-CM | POA: Diagnosis not present

## 2019-09-19 DIAGNOSIS — N186 End stage renal disease: Secondary | ICD-10-CM | POA: Diagnosis not present

## 2019-09-19 DIAGNOSIS — D631 Anemia in chronic kidney disease: Secondary | ICD-10-CM | POA: Diagnosis not present

## 2019-09-19 DIAGNOSIS — N2581 Secondary hyperparathyroidism of renal origin: Secondary | ICD-10-CM | POA: Diagnosis not present

## 2019-09-19 DIAGNOSIS — E876 Hypokalemia: Secondary | ICD-10-CM | POA: Diagnosis not present

## 2019-09-19 LAB — CBC
Hematocrit: 35.8 % (ref 34.0–46.6)
Hemoglobin: 11.4 g/dL (ref 11.1–15.9)
MCH: 24.5 pg — ABNORMAL LOW (ref 26.6–33.0)
MCHC: 31.8 g/dL (ref 31.5–35.7)
MCV: 77 fL — ABNORMAL LOW (ref 79–97)
Platelets: 299 10*3/uL (ref 150–450)
RBC: 4.65 x10E6/uL (ref 3.77–5.28)
RDW: 15.3 % (ref 11.7–15.4)
WBC: 10.6 10*3/uL (ref 3.4–10.8)

## 2019-09-19 LAB — FRUCTOSAMINE: Fructosamine: 490 umol/L — ABNORMAL HIGH (ref 0–285)

## 2019-09-21 ENCOUNTER — Encounter: Payer: Self-pay | Admitting: Gastroenterology

## 2019-09-21 DIAGNOSIS — Z992 Dependence on renal dialysis: Secondary | ICD-10-CM | POA: Diagnosis not present

## 2019-09-21 DIAGNOSIS — N186 End stage renal disease: Secondary | ICD-10-CM | POA: Diagnosis not present

## 2019-09-21 DIAGNOSIS — E1122 Type 2 diabetes mellitus with diabetic chronic kidney disease: Secondary | ICD-10-CM | POA: Diagnosis not present

## 2019-09-21 DIAGNOSIS — E876 Hypokalemia: Secondary | ICD-10-CM | POA: Diagnosis not present

## 2019-09-21 DIAGNOSIS — N2581 Secondary hyperparathyroidism of renal origin: Secondary | ICD-10-CM | POA: Diagnosis not present

## 2019-09-21 DIAGNOSIS — D631 Anemia in chronic kidney disease: Secondary | ICD-10-CM | POA: Diagnosis not present

## 2019-09-24 DIAGNOSIS — Z992 Dependence on renal dialysis: Secondary | ICD-10-CM | POA: Diagnosis not present

## 2019-09-24 DIAGNOSIS — E1122 Type 2 diabetes mellitus with diabetic chronic kidney disease: Secondary | ICD-10-CM | POA: Diagnosis not present

## 2019-09-24 DIAGNOSIS — N186 End stage renal disease: Secondary | ICD-10-CM | POA: Diagnosis not present

## 2019-09-24 DIAGNOSIS — D631 Anemia in chronic kidney disease: Secondary | ICD-10-CM | POA: Diagnosis not present

## 2019-09-24 DIAGNOSIS — N2581 Secondary hyperparathyroidism of renal origin: Secondary | ICD-10-CM | POA: Diagnosis not present

## 2019-09-24 DIAGNOSIS — E876 Hypokalemia: Secondary | ICD-10-CM | POA: Diagnosis not present

## 2019-09-26 DIAGNOSIS — N2581 Secondary hyperparathyroidism of renal origin: Secondary | ICD-10-CM | POA: Diagnosis not present

## 2019-09-26 DIAGNOSIS — D631 Anemia in chronic kidney disease: Secondary | ICD-10-CM | POA: Diagnosis not present

## 2019-09-26 DIAGNOSIS — Z992 Dependence on renal dialysis: Secondary | ICD-10-CM | POA: Diagnosis not present

## 2019-09-26 DIAGNOSIS — E876 Hypokalemia: Secondary | ICD-10-CM | POA: Diagnosis not present

## 2019-09-26 DIAGNOSIS — E1122 Type 2 diabetes mellitus with diabetic chronic kidney disease: Secondary | ICD-10-CM | POA: Diagnosis not present

## 2019-09-26 DIAGNOSIS — N186 End stage renal disease: Secondary | ICD-10-CM | POA: Diagnosis not present

## 2019-09-28 DIAGNOSIS — E1122 Type 2 diabetes mellitus with diabetic chronic kidney disease: Secondary | ICD-10-CM | POA: Diagnosis not present

## 2019-09-28 DIAGNOSIS — Z992 Dependence on renal dialysis: Secondary | ICD-10-CM | POA: Diagnosis not present

## 2019-09-28 DIAGNOSIS — N186 End stage renal disease: Secondary | ICD-10-CM | POA: Diagnosis not present

## 2019-09-28 DIAGNOSIS — D631 Anemia in chronic kidney disease: Secondary | ICD-10-CM | POA: Diagnosis not present

## 2019-09-28 DIAGNOSIS — E876 Hypokalemia: Secondary | ICD-10-CM | POA: Diagnosis not present

## 2019-09-28 DIAGNOSIS — N2581 Secondary hyperparathyroidism of renal origin: Secondary | ICD-10-CM | POA: Diagnosis not present

## 2019-10-01 DIAGNOSIS — N186 End stage renal disease: Secondary | ICD-10-CM | POA: Diagnosis not present

## 2019-10-01 DIAGNOSIS — E1122 Type 2 diabetes mellitus with diabetic chronic kidney disease: Secondary | ICD-10-CM | POA: Diagnosis not present

## 2019-10-01 DIAGNOSIS — D631 Anemia in chronic kidney disease: Secondary | ICD-10-CM | POA: Diagnosis not present

## 2019-10-01 DIAGNOSIS — E876 Hypokalemia: Secondary | ICD-10-CM | POA: Diagnosis not present

## 2019-10-01 DIAGNOSIS — Z992 Dependence on renal dialysis: Secondary | ICD-10-CM | POA: Diagnosis not present

## 2019-10-01 DIAGNOSIS — N2581 Secondary hyperparathyroidism of renal origin: Secondary | ICD-10-CM | POA: Diagnosis not present

## 2019-10-03 DIAGNOSIS — Z992 Dependence on renal dialysis: Secondary | ICD-10-CM | POA: Diagnosis not present

## 2019-10-03 DIAGNOSIS — E876 Hypokalemia: Secondary | ICD-10-CM | POA: Diagnosis not present

## 2019-10-03 DIAGNOSIS — E1122 Type 2 diabetes mellitus with diabetic chronic kidney disease: Secondary | ICD-10-CM | POA: Diagnosis not present

## 2019-10-03 DIAGNOSIS — D631 Anemia in chronic kidney disease: Secondary | ICD-10-CM | POA: Diagnosis not present

## 2019-10-03 DIAGNOSIS — N186 End stage renal disease: Secondary | ICD-10-CM | POA: Diagnosis not present

## 2019-10-03 DIAGNOSIS — N2581 Secondary hyperparathyroidism of renal origin: Secondary | ICD-10-CM | POA: Diagnosis not present

## 2019-10-05 DIAGNOSIS — E876 Hypokalemia: Secondary | ICD-10-CM | POA: Diagnosis not present

## 2019-10-05 DIAGNOSIS — D631 Anemia in chronic kidney disease: Secondary | ICD-10-CM | POA: Diagnosis not present

## 2019-10-05 DIAGNOSIS — N2581 Secondary hyperparathyroidism of renal origin: Secondary | ICD-10-CM | POA: Diagnosis not present

## 2019-10-05 DIAGNOSIS — E1122 Type 2 diabetes mellitus with diabetic chronic kidney disease: Secondary | ICD-10-CM | POA: Diagnosis not present

## 2019-10-05 DIAGNOSIS — N186 End stage renal disease: Secondary | ICD-10-CM | POA: Diagnosis not present

## 2019-10-05 DIAGNOSIS — Z992 Dependence on renal dialysis: Secondary | ICD-10-CM | POA: Diagnosis not present

## 2019-10-07 DIAGNOSIS — E1122 Type 2 diabetes mellitus with diabetic chronic kidney disease: Secondary | ICD-10-CM | POA: Diagnosis not present

## 2019-10-07 DIAGNOSIS — N186 End stage renal disease: Secondary | ICD-10-CM | POA: Diagnosis not present

## 2019-10-07 DIAGNOSIS — Z992 Dependence on renal dialysis: Secondary | ICD-10-CM | POA: Diagnosis not present

## 2019-10-08 DIAGNOSIS — Z992 Dependence on renal dialysis: Secondary | ICD-10-CM | POA: Diagnosis not present

## 2019-10-08 DIAGNOSIS — D509 Iron deficiency anemia, unspecified: Secondary | ICD-10-CM | POA: Diagnosis not present

## 2019-10-08 DIAGNOSIS — N2581 Secondary hyperparathyroidism of renal origin: Secondary | ICD-10-CM | POA: Diagnosis not present

## 2019-10-08 DIAGNOSIS — E1122 Type 2 diabetes mellitus with diabetic chronic kidney disease: Secondary | ICD-10-CM | POA: Diagnosis not present

## 2019-10-08 DIAGNOSIS — E876 Hypokalemia: Secondary | ICD-10-CM | POA: Diagnosis not present

## 2019-10-08 DIAGNOSIS — D631 Anemia in chronic kidney disease: Secondary | ICD-10-CM | POA: Diagnosis not present

## 2019-10-08 DIAGNOSIS — N186 End stage renal disease: Secondary | ICD-10-CM | POA: Diagnosis not present

## 2019-10-10 DIAGNOSIS — Z992 Dependence on renal dialysis: Secondary | ICD-10-CM | POA: Diagnosis not present

## 2019-10-10 DIAGNOSIS — D509 Iron deficiency anemia, unspecified: Secondary | ICD-10-CM | POA: Diagnosis not present

## 2019-10-10 DIAGNOSIS — N186 End stage renal disease: Secondary | ICD-10-CM | POA: Diagnosis not present

## 2019-10-10 DIAGNOSIS — E876 Hypokalemia: Secondary | ICD-10-CM | POA: Diagnosis not present

## 2019-10-10 DIAGNOSIS — D631 Anemia in chronic kidney disease: Secondary | ICD-10-CM | POA: Diagnosis not present

## 2019-10-10 DIAGNOSIS — E1122 Type 2 diabetes mellitus with diabetic chronic kidney disease: Secondary | ICD-10-CM | POA: Diagnosis not present

## 2019-10-11 ENCOUNTER — Ambulatory Visit: Payer: Medicare Other

## 2019-10-12 DIAGNOSIS — E876 Hypokalemia: Secondary | ICD-10-CM | POA: Diagnosis not present

## 2019-10-12 DIAGNOSIS — D631 Anemia in chronic kidney disease: Secondary | ICD-10-CM | POA: Diagnosis not present

## 2019-10-12 DIAGNOSIS — N186 End stage renal disease: Secondary | ICD-10-CM | POA: Diagnosis not present

## 2019-10-12 DIAGNOSIS — E1122 Type 2 diabetes mellitus with diabetic chronic kidney disease: Secondary | ICD-10-CM | POA: Diagnosis not present

## 2019-10-12 DIAGNOSIS — D509 Iron deficiency anemia, unspecified: Secondary | ICD-10-CM | POA: Diagnosis not present

## 2019-10-12 DIAGNOSIS — Z992 Dependence on renal dialysis: Secondary | ICD-10-CM | POA: Diagnosis not present

## 2019-10-15 DIAGNOSIS — E1122 Type 2 diabetes mellitus with diabetic chronic kidney disease: Secondary | ICD-10-CM | POA: Diagnosis not present

## 2019-10-15 DIAGNOSIS — N186 End stage renal disease: Secondary | ICD-10-CM | POA: Diagnosis not present

## 2019-10-15 DIAGNOSIS — D509 Iron deficiency anemia, unspecified: Secondary | ICD-10-CM | POA: Diagnosis not present

## 2019-10-15 DIAGNOSIS — D631 Anemia in chronic kidney disease: Secondary | ICD-10-CM | POA: Diagnosis not present

## 2019-10-15 DIAGNOSIS — E876 Hypokalemia: Secondary | ICD-10-CM | POA: Diagnosis not present

## 2019-10-15 DIAGNOSIS — Z992 Dependence on renal dialysis: Secondary | ICD-10-CM | POA: Diagnosis not present

## 2019-10-17 DIAGNOSIS — Z992 Dependence on renal dialysis: Secondary | ICD-10-CM | POA: Diagnosis not present

## 2019-10-17 DIAGNOSIS — E876 Hypokalemia: Secondary | ICD-10-CM | POA: Diagnosis not present

## 2019-10-17 DIAGNOSIS — D631 Anemia in chronic kidney disease: Secondary | ICD-10-CM | POA: Diagnosis not present

## 2019-10-17 DIAGNOSIS — E1122 Type 2 diabetes mellitus with diabetic chronic kidney disease: Secondary | ICD-10-CM | POA: Diagnosis not present

## 2019-10-17 DIAGNOSIS — D509 Iron deficiency anemia, unspecified: Secondary | ICD-10-CM | POA: Diagnosis not present

## 2019-10-17 DIAGNOSIS — N186 End stage renal disease: Secondary | ICD-10-CM | POA: Diagnosis not present

## 2019-10-19 DIAGNOSIS — E876 Hypokalemia: Secondary | ICD-10-CM | POA: Diagnosis not present

## 2019-10-19 DIAGNOSIS — D509 Iron deficiency anemia, unspecified: Secondary | ICD-10-CM | POA: Diagnosis not present

## 2019-10-19 DIAGNOSIS — E1122 Type 2 diabetes mellitus with diabetic chronic kidney disease: Secondary | ICD-10-CM | POA: Diagnosis not present

## 2019-10-19 DIAGNOSIS — Z992 Dependence on renal dialysis: Secondary | ICD-10-CM | POA: Diagnosis not present

## 2019-10-19 DIAGNOSIS — D631 Anemia in chronic kidney disease: Secondary | ICD-10-CM | POA: Diagnosis not present

## 2019-10-19 DIAGNOSIS — N186 End stage renal disease: Secondary | ICD-10-CM | POA: Diagnosis not present

## 2019-10-22 DIAGNOSIS — D631 Anemia in chronic kidney disease: Secondary | ICD-10-CM | POA: Diagnosis not present

## 2019-10-22 DIAGNOSIS — E876 Hypokalemia: Secondary | ICD-10-CM | POA: Diagnosis not present

## 2019-10-22 DIAGNOSIS — D509 Iron deficiency anemia, unspecified: Secondary | ICD-10-CM | POA: Diagnosis not present

## 2019-10-22 DIAGNOSIS — E1122 Type 2 diabetes mellitus with diabetic chronic kidney disease: Secondary | ICD-10-CM | POA: Diagnosis not present

## 2019-10-22 DIAGNOSIS — Z992 Dependence on renal dialysis: Secondary | ICD-10-CM | POA: Diagnosis not present

## 2019-10-22 DIAGNOSIS — N186 End stage renal disease: Secondary | ICD-10-CM | POA: Diagnosis not present

## 2019-10-23 ENCOUNTER — Telehealth: Payer: Self-pay

## 2019-10-23 NOTE — Telephone Encounter (Signed)
Patient No Showed for PV today. Patient was called to reschedule her PV and procedure. Left a message for patient to call back before 5:00 Pm today. If patient does not call a no show letter will be mailed at the end of the day. Procedure will be cancelled per [place] guidelines.   Riki Sheer, LPN ( PV )

## 2019-10-24 DIAGNOSIS — Z992 Dependence on renal dialysis: Secondary | ICD-10-CM | POA: Diagnosis not present

## 2019-10-24 DIAGNOSIS — E1122 Type 2 diabetes mellitus with diabetic chronic kidney disease: Secondary | ICD-10-CM | POA: Diagnosis not present

## 2019-10-24 DIAGNOSIS — E876 Hypokalemia: Secondary | ICD-10-CM | POA: Diagnosis not present

## 2019-10-24 DIAGNOSIS — D631 Anemia in chronic kidney disease: Secondary | ICD-10-CM | POA: Diagnosis not present

## 2019-10-24 DIAGNOSIS — D509 Iron deficiency anemia, unspecified: Secondary | ICD-10-CM | POA: Diagnosis not present

## 2019-10-24 DIAGNOSIS — N186 End stage renal disease: Secondary | ICD-10-CM | POA: Diagnosis not present

## 2019-10-26 DIAGNOSIS — E1122 Type 2 diabetes mellitus with diabetic chronic kidney disease: Secondary | ICD-10-CM | POA: Diagnosis not present

## 2019-10-26 DIAGNOSIS — D631 Anemia in chronic kidney disease: Secondary | ICD-10-CM | POA: Diagnosis not present

## 2019-10-26 DIAGNOSIS — Z992 Dependence on renal dialysis: Secondary | ICD-10-CM | POA: Diagnosis not present

## 2019-10-26 DIAGNOSIS — D509 Iron deficiency anemia, unspecified: Secondary | ICD-10-CM | POA: Diagnosis not present

## 2019-10-26 DIAGNOSIS — E876 Hypokalemia: Secondary | ICD-10-CM | POA: Diagnosis not present

## 2019-10-26 DIAGNOSIS — N186 End stage renal disease: Secondary | ICD-10-CM | POA: Diagnosis not present

## 2019-10-28 DIAGNOSIS — N186 End stage renal disease: Secondary | ICD-10-CM | POA: Diagnosis not present

## 2019-10-28 DIAGNOSIS — D631 Anemia in chronic kidney disease: Secondary | ICD-10-CM | POA: Diagnosis not present

## 2019-10-28 DIAGNOSIS — E1122 Type 2 diabetes mellitus with diabetic chronic kidney disease: Secondary | ICD-10-CM | POA: Diagnosis not present

## 2019-10-28 DIAGNOSIS — Z992 Dependence on renal dialysis: Secondary | ICD-10-CM | POA: Diagnosis not present

## 2019-10-28 DIAGNOSIS — E876 Hypokalemia: Secondary | ICD-10-CM | POA: Diagnosis not present

## 2019-10-28 DIAGNOSIS — D509 Iron deficiency anemia, unspecified: Secondary | ICD-10-CM | POA: Diagnosis not present

## 2019-10-30 DIAGNOSIS — Z992 Dependence on renal dialysis: Secondary | ICD-10-CM | POA: Diagnosis not present

## 2019-10-30 DIAGNOSIS — E1122 Type 2 diabetes mellitus with diabetic chronic kidney disease: Secondary | ICD-10-CM | POA: Diagnosis not present

## 2019-10-30 DIAGNOSIS — E876 Hypokalemia: Secondary | ICD-10-CM | POA: Diagnosis not present

## 2019-10-30 DIAGNOSIS — D631 Anemia in chronic kidney disease: Secondary | ICD-10-CM | POA: Diagnosis not present

## 2019-10-30 DIAGNOSIS — D509 Iron deficiency anemia, unspecified: Secondary | ICD-10-CM | POA: Diagnosis not present

## 2019-10-30 DIAGNOSIS — N186 End stage renal disease: Secondary | ICD-10-CM | POA: Diagnosis not present

## 2019-11-02 DIAGNOSIS — E876 Hypokalemia: Secondary | ICD-10-CM | POA: Diagnosis not present

## 2019-11-02 DIAGNOSIS — N186 End stage renal disease: Secondary | ICD-10-CM | POA: Diagnosis not present

## 2019-11-02 DIAGNOSIS — D509 Iron deficiency anemia, unspecified: Secondary | ICD-10-CM | POA: Diagnosis not present

## 2019-11-02 DIAGNOSIS — Z992 Dependence on renal dialysis: Secondary | ICD-10-CM | POA: Diagnosis not present

## 2019-11-02 DIAGNOSIS — D631 Anemia in chronic kidney disease: Secondary | ICD-10-CM | POA: Diagnosis not present

## 2019-11-02 DIAGNOSIS — E1122 Type 2 diabetes mellitus with diabetic chronic kidney disease: Secondary | ICD-10-CM | POA: Diagnosis not present

## 2019-11-05 DIAGNOSIS — D509 Iron deficiency anemia, unspecified: Secondary | ICD-10-CM | POA: Diagnosis not present

## 2019-11-05 DIAGNOSIS — D631 Anemia in chronic kidney disease: Secondary | ICD-10-CM | POA: Diagnosis not present

## 2019-11-05 DIAGNOSIS — E1122 Type 2 diabetes mellitus with diabetic chronic kidney disease: Secondary | ICD-10-CM | POA: Diagnosis not present

## 2019-11-05 DIAGNOSIS — Z992 Dependence on renal dialysis: Secondary | ICD-10-CM | POA: Diagnosis not present

## 2019-11-05 DIAGNOSIS — N186 End stage renal disease: Secondary | ICD-10-CM | POA: Diagnosis not present

## 2019-11-05 DIAGNOSIS — E876 Hypokalemia: Secondary | ICD-10-CM | POA: Diagnosis not present

## 2019-11-06 ENCOUNTER — Other Ambulatory Visit: Payer: Self-pay

## 2019-11-06 ENCOUNTER — Ambulatory Visit (INDEPENDENT_AMBULATORY_CARE_PROVIDER_SITE_OTHER): Payer: Medicare Other | Admitting: Family Medicine

## 2019-11-06 ENCOUNTER — Encounter: Payer: Self-pay | Admitting: Family Medicine

## 2019-11-06 ENCOUNTER — Encounter: Payer: Medicare Other | Admitting: Gastroenterology

## 2019-11-06 VITALS — BP 150/68 | HR 81 | Ht 60.0 in | Wt 205.1 lb

## 2019-11-06 DIAGNOSIS — Z0000 Encounter for general adult medical examination without abnormal findings: Secondary | ICD-10-CM | POA: Insufficient documentation

## 2019-11-06 DIAGNOSIS — E1142 Type 2 diabetes mellitus with diabetic polyneuropathy: Secondary | ICD-10-CM

## 2019-11-06 DIAGNOSIS — E785 Hyperlipidemia, unspecified: Secondary | ICD-10-CM

## 2019-11-06 DIAGNOSIS — Z89511 Acquired absence of right leg below knee: Secondary | ICD-10-CM

## 2019-11-06 DIAGNOSIS — E1139 Type 2 diabetes mellitus with other diabetic ophthalmic complication: Secondary | ICD-10-CM

## 2019-11-06 DIAGNOSIS — Z794 Long term (current) use of insulin: Secondary | ICD-10-CM

## 2019-11-06 DIAGNOSIS — H548 Legal blindness, as defined in USA: Secondary | ICD-10-CM

## 2019-11-06 LAB — POCT GLYCOSYLATED HEMOGLOBIN (HGB A1C): Hemoglobin A1C: 8 % — AB (ref 4.0–5.6)

## 2019-11-06 MED ORDER — BLOOD GLUCOSE METER KIT: PACK | 0 refills | Status: DC

## 2019-11-06 NOTE — Assessment & Plan Note (Signed)
Days for multiple healthcare maintenance items.  Recently missed GI appointment for colonoscopy.  Mammogram ordered.  Previously scheduled in GYN clinic to update Pap smear but patient no showed.  Discussed the above with patient.  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  Will reevaluate at follow-up appointment.  Did instruct her to call her GI and ophthalmology doctors to reschedule.

## 2019-11-06 NOTE — Progress Notes (Signed)
Subjective:   Patient ID: [name]    DOB: [birth year], 53 y.o. female   MRN: [id number]  FABIAN COCA is a 53 y.o. female with a history of HTN, h/o liver abscess, T2DM, ESRD on HD MWF, HLD, legally blind, mod protein-calorie malnutrition, s/p R BKA here for   Diabetes, Type 2 - Last A1c 8.4 12/25/18 - Medications:  Humulin 70/30 10u BID - Compliance: good - Checking BG at home: no, hasn't been able to get meter - Diet: eats oatmeal, grits, eggs. Has cut down to 1 sausage patty at breakfast. Has cut back on deli meat at lunch, typically eats chicken salad. Baked meats, rice, and vegetable at dinner. - Exercise: wheelchair bound - Eye exam: due - Foot exam: UTD - Microalbumin: n/a - Statin: yes - Denies symptoms of hypoglycemia, polyuria, polydipsia, foot ulcers/trauma  Healthcare Maintenance - Vaccines: UTD - Colonoscopy: missed recent appt. - Mammogram: due, ordered - Pap Smear: due - A1c: due - Lipid Panel: due  Review of Systems:  Per HPI.  Medications and smoking status reviewed.  Objective:   BP (!) 150/68   Pulse 81   Ht 5' (1.524 m)   Wt 205 lb 2 oz (93 kg)   LMP 03/07/2019   SpO2 98%   BMI 40.06 kg/m  Vitals and nursing note reviewed.  General: Overweight, elderly female, wheelchair-bound, in no acute distress with non-toxic appearance CV: regular rate and rhythm without murmurs, rubs, or gallops, no lower extremity edema Lungs: clear to auscultation bilaterally with normal work of breathing Skin: warm, dry, no rashes or lesions   Assessment & Plan:   Type 2 diabetes mellitus (HCC) Uncontrolled but stable, A1c 8.0 today.  No hypoglycemic episodes.  Will increase Humulin to 12 units twice daily.  Provided paper prescription for blood glucose meter as she has had difficulty getting this.  Follow-up in 1 month with CBG readings.  Hyperlipidemia Obtaining updated lipid panel today.  Healthcare maintenance Days for multiple healthcare maintenance  items.  Recently missed GI appointment for colonoscopy.  Mammogram ordered.  Previously scheduled in GYN clinic to update Pap smear but patient no showed.  Discussed the above with patient.  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  Will reevaluate at follow-up appointment.  Did instruct her to call her GI and ophthalmology doctors to reschedule.  S/P BKA (below knee amputation) unilateral, right [facility]) Prescription provided for new wheelchair as patient's current wheelchair brakes do not work.  Orders Placed This Encounter  Procedures  . DME Wheelchair manual    Patient suffers from below knee amputation, blindness which impairs their ability to perform daily activities like bathing and dressing in the home.  A cane, crutch or walker will not resolve issue with performing activities of daily living. A wheelchair will allow patient to safely perform daily activities. Patient can safely propel the wheelchair in the home or has a caregiver who can provide assistance. Length of need Lifetime. Accessories: elevating leg rests (ELRs), wheel locks, extensions and anti-tippers.  . Lipid Panel  . HgB A1c   Meds ordered this encounter  Medications  . blood glucose meter kit and supplies    Sig: Dispense based on patient and insurance preference. Use up to four times daily as directed. (FOR ICD-10 E10.9, E11.9).    Dispense:  1 each    Refill:  0    Please provide meter covered by patient's insurance with lancets and test strips.    Order Specific Question:   Number of strips    Answer:   100    Order Specific Question:   Number of lancets    Answer:   [place], DO PGY-3, [facility] 11/06/2019 2:32 PM

## 2019-11-06 NOTE — Assessment & Plan Note (Signed)
Prescription provided for new wheelchair as patient's current wheelchair brakes do not work.

## 2019-11-06 NOTE — Assessment & Plan Note (Signed)
Uncontrolled but stable, A1c 8.0 today.  No hypoglycemic episodes.  Will increase Humulin to 12 units twice daily.  Provided paper prescription for blood glucose meter as she has had difficulty getting this.  Follow-up in 1 month with CBG readings.

## 2019-11-06 NOTE — Assessment & Plan Note (Signed)
Obtaining updated lipid panel today.

## 2019-11-06 NOTE — Patient Instructions (Addendum)
It was great to see you!  Our plans for today:  - I have provided a written prescription for a wheelchair, take this to the medical supply store. - And provide a written prescription for your blood sugar meter, take this to the pharmacy. -Your A1c is 8 today.  We will increase your Humulin to 12 units twice daily. -Come back in 1 month for diabetes checkup.  We are checking some labs today, we will call you or send you a letter if they are abnormal.   Take care and seek immediate care sooner if you develop any concerns.   Dr. [facility]

## 2019-11-07 LAB — LIPID PANEL
Chol/HDL Ratio: 2.8 ratio (ref 0.0–4.4)
Cholesterol, Total: 142 mg/dL (ref 100–199)
HDL: 51 mg/dL (ref >39)
LDL Chol Calc (NIH): 61 mg/dL (ref 0–99)
Triglycerides: 179 mg/dL — ABNORMAL HIGH (ref 0–149)
VLDL Cholesterol Cal: 30 mg/dL (ref 5–40)

## 2019-11-08 ENCOUNTER — Other Ambulatory Visit: Payer: Self-pay | Admitting: Family Medicine

## 2019-11-08 MED ORDER — BLOOD GLUCOSE METER KIT: PACK | 0 refills | Status: DC

## 2019-11-08 NOTE — Progress Notes (Signed)
Printed and placed up front for patient to take to pharmacy.

## 2019-11-27 IMAGING — CR DG CHEST 2V
2 series · 2 of 2 positions shown · non-contrast
Comparison: 08/20/2012

CLINICAL DATA: Weakness

EXAM:
CHEST  2 VIEW

[chest lat]
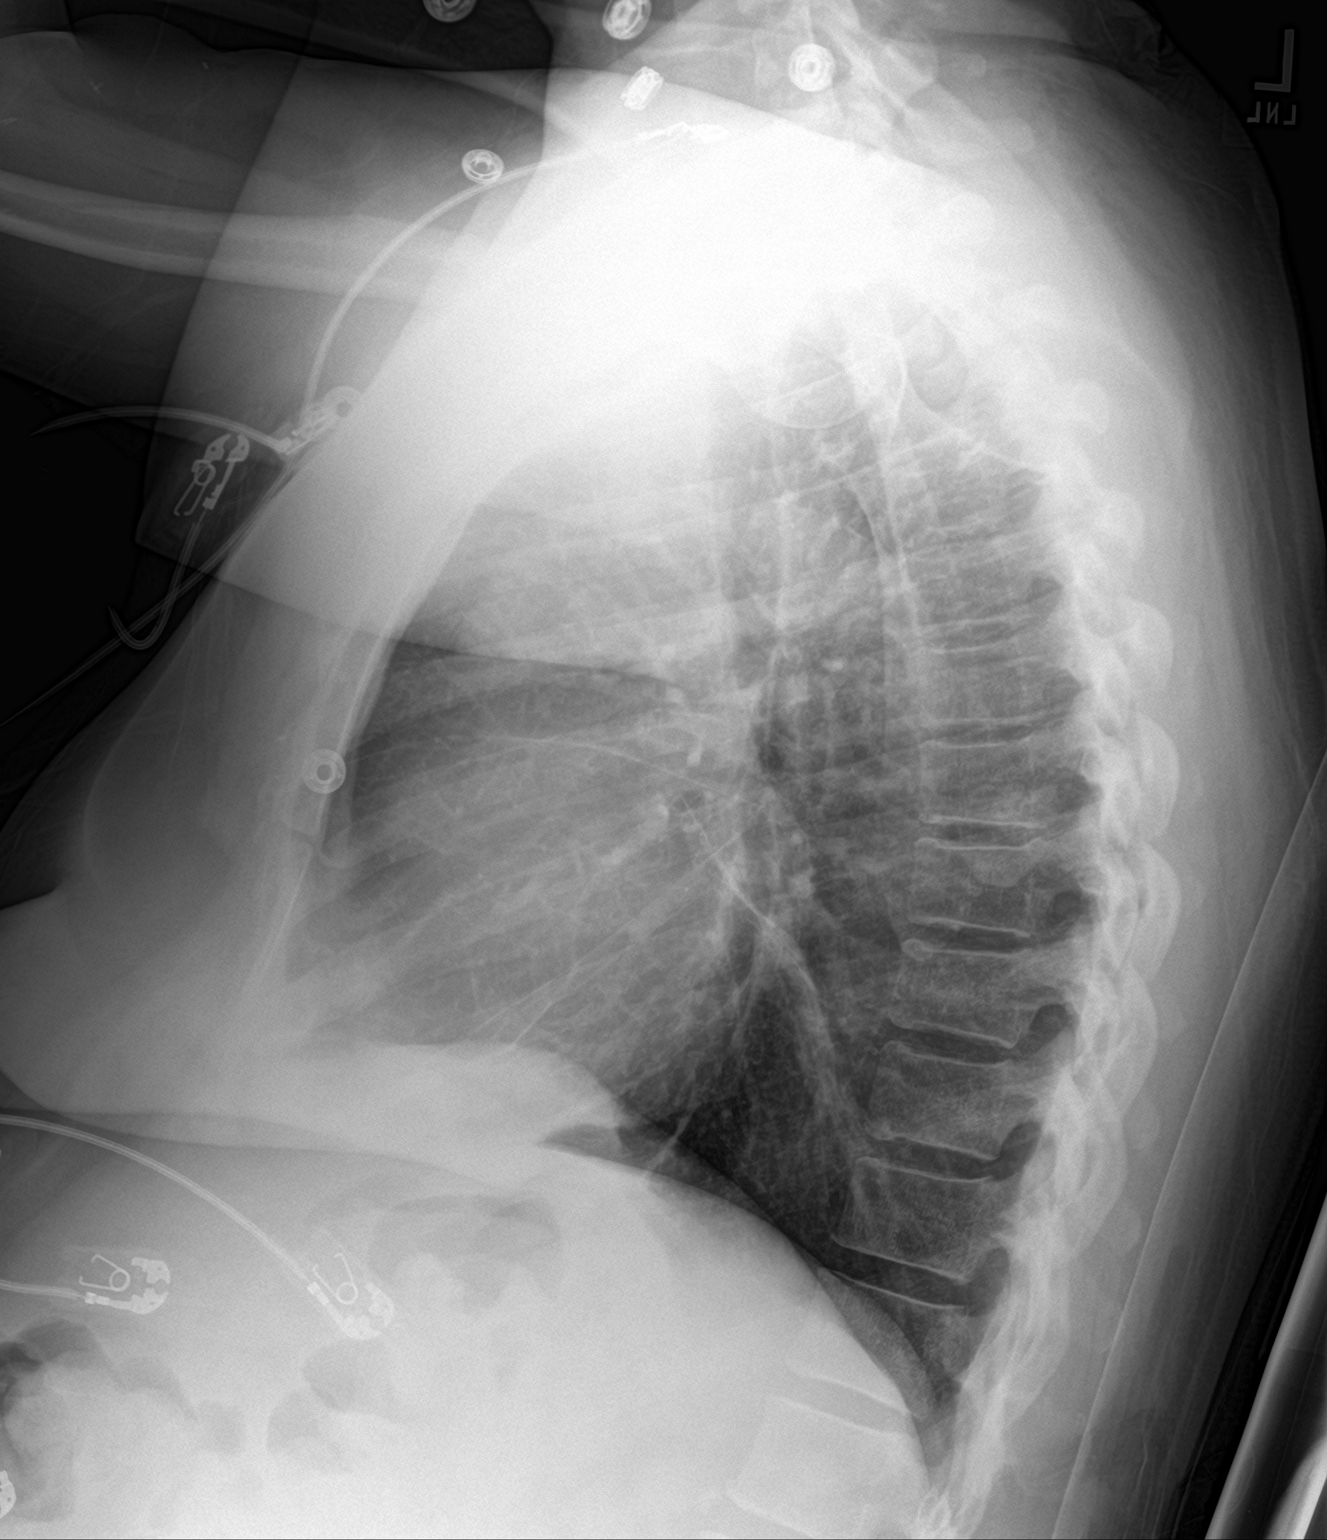

[chest ap]
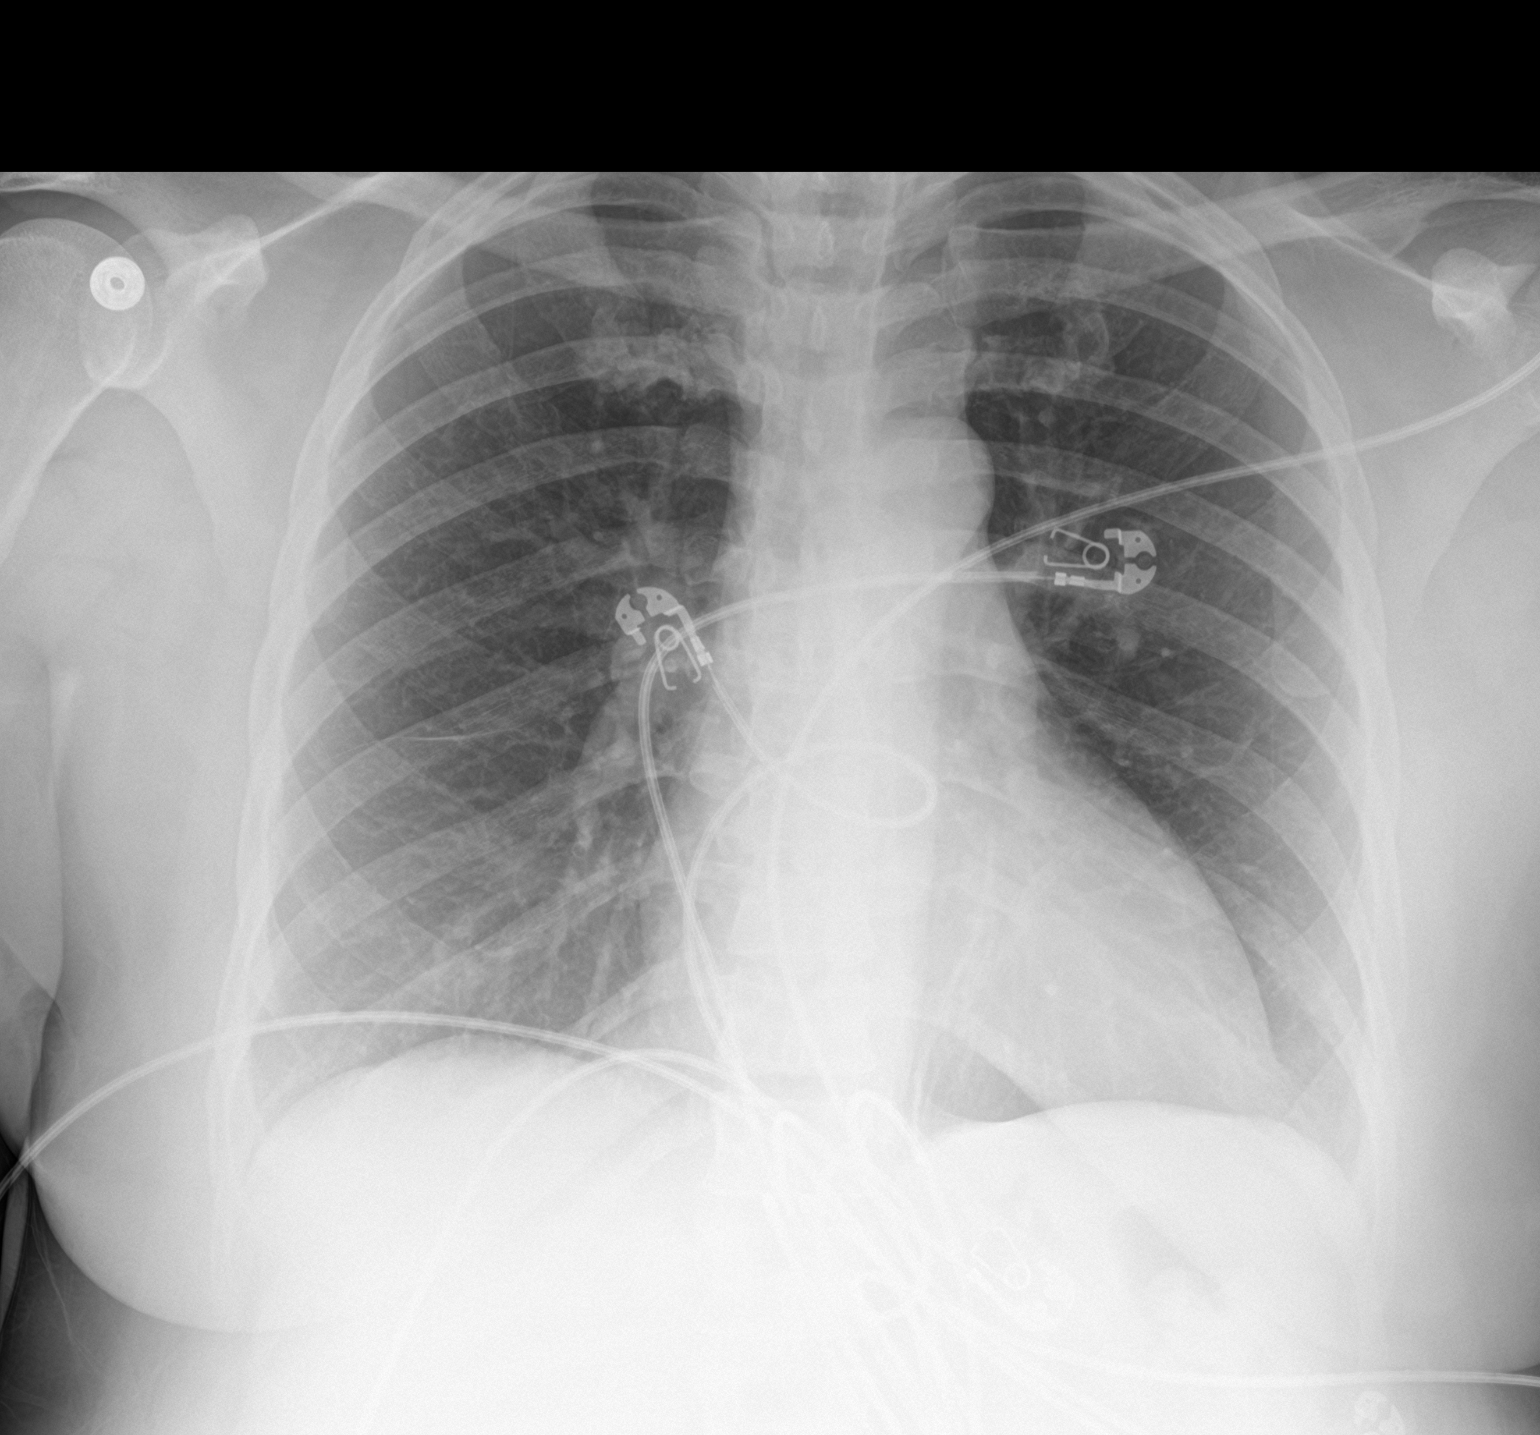

[2 of 2 positions shown; findings below may reference images not displayed]

FINDINGS: Heart is upper limits normal in size. Lungs are clear. No effusions.
No acute bony abnormality.
IMPRESSION: No active cardiopulmonary disease.

## 2019-12-06 ENCOUNTER — Ambulatory Visit: Payer: Medicare Other

## 2019-12-12 ENCOUNTER — Ambulatory Visit (INDEPENDENT_AMBULATORY_CARE_PROVIDER_SITE_OTHER): Payer: Medicare Other

## 2019-12-12 ENCOUNTER — Other Ambulatory Visit: Payer: Self-pay

## 2019-12-12 DIAGNOSIS — Z0000 Encounter for general adult medical examination without abnormal findings: Secondary | ICD-10-CM | POA: Diagnosis not present

## 2019-12-12 NOTE — Progress Notes (Addendum)
Subjective:   [name] is a 54 y.o. female who presents for Medicare Annual/Subsequent preventive examination.  The patient consented to a virtual visit.   Review of Systems: Defer to PCP.  Cardiac Risk Factors include: diabetes mellitus  Objective:   Vitals: LMP 03/07/2019   There is no height or weight on file to calculate BMI.  Advanced Directives 12/12/2019 11/06/2019 09/18/2019 01/02/2019 12/20/2018 12/19/2018 12/07/2018  Does Patient Have a Medical Advance Directive? No No No No - No -  Would patient like information on creating a medical advance directive? Yes (MAU/Ambulatory/Procedural Areas - Information given) No - Patient declined No - Patient declined No - Patient declined No - Patient declined - No - Patient declined  Pre-existing out of facility DNR order (yellow form or pink MOST form) - - - - - - -    Tobacco Social History   Tobacco Use  Smoking Status Never Smoker  Smokeless Tobacco Never Used     Clinical Intake:  Pre-visit preparation completed: Yes  How often do you need to have someone help you when you read instructions, pamphlets, or other written materials from your doctor or pharmacy?: 2 - Rarely What is the last grade level you completed in school?: 10th grade  Interpreter Needed?: No  Past Medical History:  Diagnosis Date  . Alcohol dependency ([place])   . Anemia   . Appendicitis 12/06/2018  . Chronic kidney disease   . Diabetes mellitus    type 2  . Diabetes mellitus type 2, uncontrolled ([place])   . End stage renal disease ([place]) 05/05/2016   Starting 04/2016, LUE fistula  . Enterococcal bacteremia   . Hypertension   . Legally blind in left eye, as defined in [place]   . Liver abscess 12/20/2018  . Necrotizing fasciitis ([place])   . PONV (postoperative nausea and vomiting)   . Sepsis without acute organ dysfunction ([place])   . Type 2 diabetes mellitus with diabetic chronic kidney disease ([place]) 05/05/2016   Past Surgical History:  Procedure Laterality  Date  . AMPUTATION     right first and left middle finger amputation 2/2 OM 08/2011 by Dr. Amedeo Plenty  . AMPUTATION  01/18/2012   Procedure: AMPUTATION DIGIT;  Surgeon: Tennis Must, MD;  Location: [facility];  Service: Orthopedics;  Laterality: Left;  revision amputation left thumb  . AMPUTATION  08/25/2012   Procedure: AMPUTATION BELOW KNEE;  Surgeon: Newt Minion, MD;  Location: [facility];  Service: Orthopedics;  Laterality: Right;  Right Below Knee Amputation  . APPENDECTOMY    . BASCILIC VEIN TRANSPOSITION Left 07/22/2015   Procedure: BASILIC VEIN TRANSPOSITION ;  Surgeon: Elam Dutch, MD;  Location: [facility];  Service: Vascular;  Laterality: Left;  . CATARACT EXTRACTION     right   . CESAREAN SECTION  1986  . CESAREAN SECTION  1985  . CESAREAN SECTION  1991  . I & D EXTREMITY  12/24/2011   Procedure: IRRIGATION AND DEBRIDEMENT EXTREMITY;  Surgeon: Tennis Must, MD;  Location: [facility];  Service: Orthopedics;  Laterality: Left;  I&Dleft thumb with partial amputation  . I & D EXTREMITY  12/26/2011   Procedure: IRRIGATION AND DEBRIDEMENT EXTREMITY;  Surgeon: Tennis Must, MD;  Location: [facility];  Service: Orthopedics;  Laterality: Left;  . I & D EXTREMITY  12/23/2011   Procedure: IRRIGATION AND DEBRIDEMENT EXTREMITY;  Surgeon: Tennis Must, MD;  Location: [facility];  Service: Orthopedics;  Laterality: Left;  I&D  Left thumb, hand and arm  . I&D left thumb  12/23/2011  . IR RADIOLOGIST EVAL & MGMT  01/04/2019  . IR RADIOLOGIST EVAL & MGMT  01/25/2019  . LAPAROSCOPIC APPENDECTOMY N/A 12/06/2018   Procedure: APPENDECTOMY LAPAROSCOPIC;  Surgeon: Kinsinger, Arta Bruce, MD;  Location: [facility];  Service: General;  Laterality: N/A;  . TUBAL LIGATION     Family History  Problem Relation Age of Onset  . Diabetes Mother   . Alcohol abuse Mother   . Diabetes Father   . Alzheimer's disease Father   . Hypertension Father   . Kidney failure Sister   . Diabetes Sister   . High blood pressure Sister      Social History   Socioeconomic History  . Marital status: Single    Spouse name: Not on file  . Number of children: 3  . Years of education: 10  . Highest education level: Not on file  Occupational History    Employer: [employer]  Tobacco Use  . Smoking status: Never Smoker  . Smokeless tobacco: Never Used  Substance and Sexual Activity  . Alcohol use: Yes    Comment: h/o alcohol abuse 4 years ago (07/21/15)  . Drug use: Yes    Types: Cocaine    Comment:  last used cocaine 4 years ago (07/21/15)  . Sexual activity: Yes    Birth control/protection: Surgical  Other Topics Concern  . Not on file  Social History Narrative   Patient lives with her boyfriend in [place].    Patient enjoys watching TV, and spending time with her children and grandchildren.    Patient is legally blind and is in a wheel chair. Patient relies on [employer] transportation.    Social Determinants of Health   Financial Resource Strain: Low Risk   . Difficulty of Paying Living Expenses: Not very hard  Food Insecurity: No Food Insecurity  . Worried About Charity fundraiser in the Last Year: Never true  . Ran Out of Food in the Last Year: Never true  Transportation Needs: No Transportation Needs  . Lack of Transportation (Medical): No  . Lack of Transportation (Non-Medical): No  Physical Activity: Inactive  . Days of Exercise per Week: 0 days  . Minutes of Exercise per Session: 0 min  Stress: No Stress Concern Present  . Feeling of Stress : Not at all  Social Connections: Somewhat Isolated  . Frequency of Communication with Friends and Family: More than three times a week  . Frequency of Social Gatherings with Friends and Family: More than three times a week  . Attends Religious Services: Never  . Active Member of Clubs or Organizations: No  . Attends Archivist Meetings: Never  . Marital Status: Living with partner  *Patient did lose her section 8 housing, however she is living comfortably with  her boyfriend*  Outpatient Encounter Medications as of 12/12/2019  Medication Sig  . amLODipine (NORVASC) 10 MG tablet TAKE 1 TABLET BY MOUTH ONCE DAILY  . atorvastatin (LIPITOR) 40 MG tablet TAKE 1 TABLET BY MOUTH ONCE DAILY  . blood glucose meter kit and supplies Dispense based on patient and insurance preference. Use up to four times daily as directed. (FOR ICD-10 E10.9, E11.9).  [name] gabapentin (NEURONTIN) 100 MG capsule TAKE 1 CAPSULE BY MOUTH ONCE DAILY AS NEEDED  . Insulin Isophane & Regular Human (HUMULIN 70/30 KWIKPEN) (70-30) 100 UNIT/ML PEN Inject 12 Units into the skin 2 (two) times daily.  . Insulin Pen Needle (  EASY TOUCH PEN NEEDLES) 32G X 4 MM MISC 1 each by Does not apply route 3 (three) times daily.  Elmore Guise Devices (ONE TOUCH DELICA LANCING DEV) MISC Check blood sugars once daily  . Lancets (ONETOUCH ULTRASOFT) lancets Use as instructed  . losartan (COZAAR) 50 MG tablet TAKE 1 TABLET BY MOUTH EVERY DAY  . Methoxy PEG-Epoetin Beta (MIRCERA) 100 MCG/0.3ML SOSY Mircera  . B Complex-C-Folic Acid (DIALYVITE 702) 0.8 MG TABS Take 1 tablet by mouth daily.  . ferric citrate (AURYXIA) 1 GM 210 MG(Fe) tablet   . glucose blood test strip Use as instructed  . mupirocin cream (BACTROBAN) 2 % Apply 1 application topically 2 (two) times daily. (Patient not taking: Reported on 12/12/2019)  . pantoprazole (PROTONIX) 20 MG tablet TAKE 1 TABLET BY MOUTH ONCE DAILY (Patient not taking: Reported on 12/12/2019)  . promethazine (PHENERGAN) 12.5 MG tablet Take 1 tablet (12.5 mg total) by mouth every 6 (six) hours as needed for up to 10 doses for nausea or vomiting. (Patient not taking: Reported on 12/12/2019)  . Sodium Chloride Flush (SALINE FLUSH) 0.9 % SOLN Inject 5 mLs into the vein 3 (three) times daily. (Patient not taking: Reported on 12/12/2019)   No facility-administered encounter medications on file as of 12/12/2019.   Activities of Daily Living In your present state of health, do you have any  difficulty performing the following activities: 12/12/2019 12/20/2018  Hearing? N N  Vision? Y Y  Comment - "blind in left; can't see a little on the right"  Difficulty concentrating or making decisions? N N  Walking or climbing stairs? Y Y  Dressing or bathing? N N  Doing errands, shopping? N Y  Conservation officer, nature and eating ? Y -  Using the Toilet? N -  In the past six months, have you accidently leaked urine? N -  Do you have problems with loss of bowel control? N -  Managing your Medications? N -  Managing your Finances? N -  Housekeeping or managing your Housekeeping? Y -  Some recent data might be hidden   Patient Care Team: Rory Percy, DO as PCP - General (Family Medicine) Rexene Agent, MD as Attending Physician (Nephrology)   Assessment:   This is a routine wellness examination for [place].  Exercise Activities and Dietary recommendations Current Exercise Habits: The patient does not participate in regular exercise at present, Exercise limited by: orthopedic condition(s)  Goals    . HEMOGLOBIN A1C < 7      Fall Risk Fall Risk  12/12/2019 11/06/2019 10/26/2018 05/25/2018 03/17/2017  Falls in the past year? 0 0 0 No No  Risk for fall due to : Impaired vision;Impaired mobility;Impaired balance/gait - - - -   Is the patient's home free of loose throw rugs in walkways, pet beds, electrical cords, etc?   yes      Grab bars in the bathroom? no      Handrails on the stairs?   yes      Adequate lighting?   yes  Patient rating of health (0-10): 10   Depression Screen PHQ 2/9 Scores 12/12/2019 11/06/2019 09/18/2019 01/02/2019  PHQ - 2 Score 0 0 0 0   Cognitive Function  6CIT Screen 12/12/2019  What Year? 0 points  What month? 0 points  What time? 0 points  Count back from 20 0 points  Months in reverse 4 points  Repeat phrase 0 points  Total Score 4   Immunization History  Administered Date(s) Administered  .  Hepatitis B, adult 05/12/2016, 06/09/2016, 07/09/2016,  11/05/2016, 01/06/2018  . Influenza Split 08/24/2012  . Influenza, Quadrivalent, Recombinant, Inj, Pf 09/03/2019  . Influenza-Unspecified 10/06/2016, 08/06/2017, 08/30/2018, 09/06/2019  . Pneumococcal Conjugate-13 09/29/2016  . Pneumococcal Polysaccharide-23 07/14/2015  . Tdap 09/08/2017   Screening Tests Health Maintenance  Topic Date Due  . PAP SMEAR-Modifier  10/05/1988  . MAMMOGRAM  10/05/2017  . COLONOSCOPY  10/05/2017  . OPHTHALMOLOGY EXAM  10/20/2019  . FOOT EXAM  09/17/2020  . LIPID PANEL  11/05/2020  . TETANUS/TDAP  09/09/2027  . INFLUENZA VACCINE  Completed  . PNEUMOCOCCAL POLYSACCHARIDE VACCINE AGE 39-64 HIGH RISK  Completed  . HIV Screening  Completed   Cancer Screenings: Lung: Low Dose CT Chest recommended if Age 32-80 years, 30 pack-year currently smoking OR have quit w/in 15years. Patient does not qualify. Colorectal: has never had one- hand out given and referral placed.  Additional Screenings: HIV Screening: Completed  PNA Vaccine: Completed   Plan:  You are due for your pap smear. You can schedule an apt in our [facility] for this at your convenience.  Consider a Colonoscopy. A referral has been placed for you.  Make an apt for your Mammogram.   I have personally reviewed and noted the following in the patient's chart:   . Medical and social history . Use of alcohol, tobacco or illicit drugs  . Current medications and supplements . Functional ability and status . Nutritional status . Physical activity . Advanced directives . List of other physicians . Hospitalizations, surgeries, and ER visits in previous 12 months . Vitals . Screenings to include cognitive, depression, and falls . Referrals and appointments  In addition, I have reviewed and discussed with patient certain preventive protocols, quality metrics, and best practice recommendations. A written personalized care plan for preventive services as well as general preventive health  recommendations were provided to patient.  This visit was conducted virtually in the setting of the [place] pandemic.   Dorna Bloom, [place]  12/12/2019    I have reviewed this visit and agree with the documentation.  Rory Percy, DO PGY-3, [facility] 12/12/2019 5:13 PM

## 2019-12-12 NOTE — Patient Instructions (Addendum)
You spoke to [name], [place] over the phone for your annual wellness visit.  We discussed goals: Goals    . HEMOGLOBIN A1C < 7      We also discussed recommended health maintenance. At this time you are due for what we discussed, see below for reference. You care due for apt with PCP for diabetes in March.  Health Maintenance  Topic Date Due  . PAP SMEAR-Modifier  10/05/1988  . MAMMOGRAM  10/05/2017  . COLONOSCOPY  10/05/2017  . OPHTHALMOLOGY EXAM  10/20/2019  . FOOT EXAM  09/17/2020  . LIPID PANEL  11/05/2020  . TETANUS/TDAP  09/09/2027  . INFLUENZA VACCINE  Completed  . PNEUMOCOCCAL POLYSACCHARIDE VACCINE AGE 40-64 HIGH RISK  Completed  . HIV Screening  Completed   You are due for your pap smear. You can schedule an apt in our [facility] for this at your convenience.  Consider a Colonoscopy. A referral has been placed for you.  Make an apt for your Mammogram.   Health Maintenance, Female Adopting a healthy lifestyle and getting preventive care are important in promoting health and wellness. Ask your health care provider about:  The right schedule for you to have regular tests and exams.  Things you can do on your own to prevent diseases and keep yourself healthy. What should I know about diet, weight, and exercise? Eat a healthy diet   Eat a diet that includes plenty of vegetables, fruits, low-fat dairy products, and lean protein.  Do not eat a lot of foods that are high in solid fats, added sugars, or sodium. Maintain a healthy weight Body mass index (BMI) is used to identify weight problems. It estimates body fat based on height and weight. Your health care provider can help determine your BMI and help you achieve or maintain a healthy weight. Get regular exercise Get regular exercise. This is one of the most important things you can do for your health. Most adults should:  Exercise for at least 150 minutes each week. The exercise should increase your heart rate  and make you sweat (moderate-intensity exercise).  Do strengthening exercises at least twice a week. This is in addition to the moderate-intensity exercise.  Spend less time sitting. Even light physical activity can be beneficial. Watch cholesterol and blood lipids Have your blood tested for lipids and cholesterol at 53 years of age, then have this test every 5 years. Have your cholesterol levels checked more often if:  Your lipid or cholesterol levels are high.  You are older than 53 years of age.  You are at high risk for heart disease. What should I know about cancer screening? Depending on your health history and family history, you may need to have cancer screening at various ages. This may include screening for:  Breast cancer.  Cervical cancer.  Colorectal cancer.  Skin cancer.  Lung cancer. What should I know about heart disease, diabetes, and high blood pressure? Blood pressure and heart disease  High blood pressure causes heart disease and increases the risk of stroke. This is more likely to develop in people who have high blood pressure readings, are of African descent, or are overweight.  Have your blood pressure checked: ? Every 3-5 years if you are 42-48 years of age. ? Every year if you are 51 years old or older. Diabetes Have regular diabetes screenings. This checks your fasting blood sugar level. Have the screening done:  Once every three years after age 69 if you  are at a normal weight and have a low risk for diabetes.  More often and at a younger age if you are overweight or have a high risk for diabetes. What should I know about preventing infection? Hepatitis B If you have a higher risk for hepatitis B, you should be screened for this virus. Talk with your health care provider to find out if you are at risk for hepatitis B infection. Hepatitis C Testing is recommended for:  Everyone born from [birth year] through 1964.  Anyone with known risk factors for  hepatitis C. Sexually transmitted infections (STIs)  Get screened for STIs, including gonorrhea and chlamydia, if: ? You are sexually active and are younger than 53 years of age. ? You are older than 53 years of age and your health care provider tells you that you are at risk for this type of infection. ? Your sexual activity has changed since you were last screened, and you are at increased risk for chlamydia or gonorrhea. Ask your health care provider if you are at risk.  Ask your health care provider about whether you are at high risk for HIV. Your health care provider may recommend a prescription medicine to help prevent HIV infection. If you choose to take medicine to prevent HIV, you should first get tested for HIV. You should then be tested every 3 months for as long as you are taking the medicine. Pregnancy  If you are about to stop having your period (premenopausal) and you may become pregnant, seek counseling before you get pregnant.  Take 400 to 800 micrograms (mcg) of folic acid every day if you become pregnant.  Ask for birth control (contraception) if you want to prevent pregnancy. Osteoporosis and menopause Osteoporosis is a disease in which the bones lose minerals and strength with aging. This can result in bone fractures. If you are 90 years old or older, or if you are at risk for osteoporosis and fractures, ask your health care provider if you should:  Be screened for bone loss.  Take a calcium or vitamin D supplement to lower your risk of fractures.  Be given hormone replacement therapy (HRT) to treat symptoms of menopause. Follow these instructions at home: Lifestyle  Do not use any products that contain nicotine or tobacco, such as cigarettes, e-cigarettes, and chewing tobacco. If you need help quitting, ask your health care provider.  Do not use street drugs.  Do not share needles.  Ask your health care provider for help if you need support or information about  quitting drugs. Alcohol use  Do not drink alcohol if: ? Your health care provider tells you not to drink. ? You are pregnant, may be pregnant, or are planning to become pregnant.  If you drink alcohol: ? Limit how much you use to 0-1 drink a day. ? Limit intake if you are breastfeeding.  Be aware of how much alcohol is in your drink. In the [place] one drink equals one 12 oz bottle of beer (355 mL), one 5 oz glass of wine (148 mL), or one 1 oz glass of hard liquor (44 mL). General instructions  Schedule regular health, dental, and eye exams.  Stay current with your vaccines.  Tell your health care provider if: ? You often feel depressed. ? You have ever been abused or do not feel safe at home. Summary  Adopting a healthy lifestyle and getting preventive care are important in promoting health and wellness.  Follow your health care provider's instructions  about healthy diet, exercising, and getting tested or screened for diseases.  Follow your health care provider's instructions on monitoring your cholesterol and blood pressure. This information is not intended to replace advice given to you by your health care provider. Make sure you discuss any questions you have with your health care provider. Document Revised: 11/15/2018 Document Reviewed: 11/15/2018 Elsevier Patient Education  [place].    Diet Recommendations for Diabetes   1. Eat at least 3 meals and 1-2 snacks per day. Never go more than 4-5 hours while awake without eating. Eat breakfast within the first hour of getting up.   2. Limit starchy foods to TWO per meal and ONE per snack. ONE portion of a starchy  food is equal to the following:   - ONE slice of bread (or its equivalent, such as half of a hamburger bun).   - 1/2 cup of a "scoopable" starchy food such as potatoes or rice.   - 15 grams of Total Carbohydrate as shown on food label.  3. Include at every meal: a protein food, a carb food, and vegetables  and/or fruit.   - Obtain twice the volume of vegetables as protein or carbohydrate foods for both lunch and dinner.   - Fresh or frozen vegetables are best.   - Keep frozen vegetables on hand for a quick vegetable serving.       Starchy (carb) foods: Bread, rice, pasta, potatoes, corn, cereal, grits, crackers, bagels, muffins, all baked goods.  (Fruits, milk, and yogurt also have carbohydrate, but most of these foods will not spike your blood sugar as most starchy foods will.)  A few fruits do cause high blood sugars; use small portions of bananas (limit to 1/2 at a time), grapes, watermelon, oranges, and most tropical fruits.    Protein foods: Meat, fish, poultry, eggs, dairy foods, and beans such as pinto and kidney beans (beans also provide carbohydrate).     Our clinic's number is [phone]. Please call with questions or concerns about what we discussed today.

## 2020-02-18 ENCOUNTER — Other Ambulatory Visit: Payer: Self-pay | Admitting: Family Medicine

## 2020-03-20 ENCOUNTER — Encounter: Payer: Self-pay | Admitting: Family Medicine

## 2020-03-20 ENCOUNTER — Ambulatory Visit (INDEPENDENT_AMBULATORY_CARE_PROVIDER_SITE_OTHER): Payer: Medicare Other | Admitting: Family Medicine

## 2020-03-20 ENCOUNTER — Other Ambulatory Visit: Payer: Self-pay

## 2020-03-20 VITALS — BP 150/60 | HR 85 | Ht 60.0 in | Wt 213.0 lb

## 2020-03-20 DIAGNOSIS — E1139 Type 2 diabetes mellitus with other diabetic ophthalmic complication: Secondary | ICD-10-CM

## 2020-03-20 DIAGNOSIS — Z794 Long term (current) use of insulin: Secondary | ICD-10-CM | POA: Diagnosis not present

## 2020-03-20 DIAGNOSIS — R42 Dizziness and giddiness: Secondary | ICD-10-CM

## 2020-03-20 NOTE — Progress Notes (Signed)
   SUBJECTIVE:   CHIEF COMPLAINT / HPI:   Dizziness: Patient reports that 1 week ago she experienced a few episodes of dizziness.  Stated that it would come and go.  She reports she did not notice it worsening when she was going to HD.  She denies any associated headache, blurry vision, weakness, chest pain, trouble breathing or palpitations.  She reports that the dizziness spontaneously resolved last Thursday or Friday.  She reports no low blood sugar, no known drop in blood pressure.  She spoke with the physician at HD who stated it could be due to her low blood pressure.  Patient reports history of vertigo but states that this dizziness does not feel like her previous episodes of vertigo as she felt more lightheaded with this and did not experience the room moving.  She reports that she is happy she is feeling better but would just like to know what caused it so that she can avoid in the future.  PERTINENT  PMH / PSH: HTN, ESRD on HD, anemia, s/p BKA  OBJECTIVE:  BP (!) 150/60   Pulse 85   Ht 5' (1.524 m)   Wt 213 lb (96.6 kg)   LMP 03/07/2019   SpO2 99%   BMI 41.60 kg/m   General: NAD, pleasant, wheelchair bound Neck: Supple, no LAD Cardiovascular: RRR, no m/r/g, no LE edema Respiratory: CTA BL, normal work of breathing MSK: s/p BKA Psych: AOx3, appropriate affect  ASSESSMENT/PLAN:   Dizziness Unclear etiology and has resolved. BP in office 150/60.  Appears euvolemic on exam. Likely multifactorial in etiology given history of ESRD on HD, HTN, h/o vertigo and anemia. BMP near patient's baseline, CBC with no current anemia. patient does report that she does not have a meter at home to check her CBGs so unclear if she has experienced hypoglycemic episodes leading to her dizziness.  Will fax over another meter prescription.  Patient to return if she has any further continued symptoms of dizziness.    [place] Jhoselyn Ruffini, DO PGY-3, [facility]

## 2020-03-20 NOTE — Patient Instructions (Signed)
Thank you for coming to see me today. It was a pleasure! Today we talked about:   I am glad that your dizziness has gotten better.  We will get some lab work today to be sure that it is not something on there that we could fix for you.  If you continue to have episodes of dizziness then do not hesitate to call or return to the office.   I have also faxed a new meter to your pharmacy for you to be able to check your blood sugar.  Please let me know if you have any issues picking this up.  Please follow-up with your regular doctor in a few months or sooner as needed.  If you have any questions or concerns, please do not hesitate to call the office at [phone].  Take Care,   [place] Narely Nobles, DO

## 2020-03-21 LAB — BASIC METABOLIC PANEL
BUN/Creatinine Ratio: 7 — ABNORMAL LOW (ref 9–23)
BUN: 46 mg/dL — ABNORMAL HIGH (ref 6–24)
CO2: 26 mmol/L (ref 20–29)
Calcium: 10 mg/dL (ref 8.7–10.2)
Chloride: 93 mmol/L — ABNORMAL LOW (ref 96–106)
Creatinine, Ser: 6.48 mg/dL — ABNORMAL HIGH (ref 0.57–1.00)
GFR calc Af Amer: 8 mL/min/{1.73_m2} — ABNORMAL LOW (ref >59)
GFR calc non Af Amer: 7 mL/min/{1.73_m2} — ABNORMAL LOW (ref >59)
Glucose: 100 mg/dL — ABNORMAL HIGH (ref 65–99)
Potassium: 5.3 mmol/L — ABNORMAL HIGH (ref 3.5–5.2)
Sodium: 140 mmol/L (ref 134–144)

## 2020-03-21 LAB — CBC
Hematocrit: 37.1 % (ref 34.0–46.6)
Hemoglobin: 11.3 g/dL (ref 11.1–15.9)
MCH: 25.2 pg — ABNORMAL LOW (ref 26.6–33.0)
MCHC: 30.5 g/dL — ABNORMAL LOW (ref 31.5–35.7)
MCV: 83 fL (ref 79–97)
Platelets: 211 10*3/uL (ref 150–450)
RBC: 4.49 x10E6/uL (ref 3.77–5.28)
RDW: 15.3 % (ref 11.7–15.4)
WBC: 7.3 10*3/uL (ref 3.4–10.8)

## 2020-03-21 LAB — HEMOGLOBIN A1C
Est. average glucose Bld gHb Est-mCnc: 140 mg/dL
Hgb A1c MFr Bld: 6.5 % — ABNORMAL HIGH (ref 4.8–5.6)

## 2020-03-25 ENCOUNTER — Other Ambulatory Visit: Payer: Self-pay | Admitting: Family Medicine

## 2020-03-25 DIAGNOSIS — R42 Dizziness and giddiness: Secondary | ICD-10-CM | POA: Insufficient documentation

## 2020-03-25 NOTE — Assessment & Plan Note (Addendum)
Unclear etiology and has resolved. BP in office 150/60.  Appears euvolemic on exam. Likely multifactorial in etiology given history of ESRD on HD, HTN, h/o vertigo and anemia. BMP near patient's baseline, CBC with no current anemia. patient does report that she does not have a meter at home to check her CBGs so unclear if she has experienced hypoglycemic episodes leading to her dizziness.  Will fax over another meter prescription.  Patient to return if she has any further continued symptoms of dizziness.

## 2020-04-01 ENCOUNTER — Encounter: Payer: Self-pay | Admitting: Family Medicine

## 2020-04-22 ENCOUNTER — Other Ambulatory Visit: Payer: Self-pay | Admitting: Family Medicine

## 2020-05-19 ENCOUNTER — Other Ambulatory Visit: Payer: Self-pay | Admitting: *Deleted

## 2020-05-19 MED ORDER — LOSARTAN POTASSIUM 50 MG PO TABS: 50.0000 mg | ORAL_TABLET | Freq: Every day | ORAL | 2 refills | Status: DC

## 2020-05-19 MED ORDER — PANTOPRAZOLE SODIUM 20 MG PO TBEC: 20.0000 mg | DELAYED_RELEASE_TABLET | Freq: Every day | ORAL | 0 refills | Status: DC

## 2020-05-20 NOTE — Telephone Encounter (Signed)
LM for patient to call back.  Will try and reach her again later today.  Torrance Stockley,CMA

## 2020-05-22 ENCOUNTER — Other Ambulatory Visit: Payer: Self-pay | Admitting: Family Medicine

## 2020-05-23 NOTE — Telephone Encounter (Signed)
Patient scheduled for 06-03-20 with Dr. Enid Derry. Patient has dialysis on the days that Dr. Ky Barban had left in clinic.  Araseli Sherry,CMA

## 2020-06-03 ENCOUNTER — Ambulatory Visit (INDEPENDENT_AMBULATORY_CARE_PROVIDER_SITE_OTHER): Payer: Medicare Other | Admitting: Family Medicine

## 2020-06-03 ENCOUNTER — Other Ambulatory Visit: Payer: Self-pay

## 2020-06-03 VITALS — BP 130/60 | HR 92 | Ht 60.0 in | Wt 214.4 lb

## 2020-06-03 DIAGNOSIS — Z794 Long term (current) use of insulin: Secondary | ICD-10-CM

## 2020-06-03 DIAGNOSIS — E1139 Type 2 diabetes mellitus with other diabetic ophthalmic complication: Secondary | ICD-10-CM | POA: Diagnosis present

## 2020-06-03 DIAGNOSIS — Z1211 Encounter for screening for malignant neoplasm of colon: Secondary | ICD-10-CM | POA: Diagnosis not present

## 2020-06-03 DIAGNOSIS — Z1231 Encounter for screening mammogram for malignant neoplasm of breast: Secondary | ICD-10-CM

## 2020-06-03 LAB — POCT GLYCOSYLATED HEMOGLOBIN (HGB A1C): HbA1c, POC (controlled diabetic range): 7 % (ref 0.0–7.0)

## 2020-06-03 MED ORDER — SHINGRIX 50 MCG/0.5ML IM SUSR: 0.5000 mL | Freq: Once | INTRAMUSCULAR | 1 refills | Status: AC

## 2020-06-03 NOTE — Assessment & Plan Note (Signed)
A1c at goal of 7.0.  Called pharmacy to ensure the patient could be able to pick up her CBG monitor and it is waiting for her given her history of hypoglycemic episode 2 to 3 weeks ago.  Patient reports when this happens she decrease her Humulin.  Encourage patient to follow-up in the next few weeks for her Pap smear and to discuss how she is doing after measuring her CBGs.

## 2020-06-03 NOTE — Patient Instructions (Addendum)
Thank you for coming to see me today. It was a pleasure! Today we talked about:   Please call [phone] with [facility] to schedule an appointment with Dr. Posey Pronto for your eye exam.    Your blood glucose monitor is available for pickup at the pharmacy.  I have also sent in the shingles vaccine for you and you may have this done at the pharmacy as well.  Please schedule your pap smear as soon as you are able.  If you have any questions or concerns, please do not hesitate to call the office at [phone].  Take Care,   Martinique Durk Carmen, DO

## 2020-06-03 NOTE — Progress Notes (Signed)
   SUBJECTIVE:   CHIEF COMPLAINT / HPI:   Diabetes: Blood Sugar ranges-patient reports she is still not been able to pick up her meter Medications: Humulin 70/30, 12 units twice daily Compliance: Yes Hypoglycemic symptoms: Reports that 2 to 3 weeks ago she had an issue with hypoglycemic event On Aspirin, and on statin Last eye exam:  Patient to schedule appointment Last foot exam:  To be performed at next visit ROS: denies dizziness, diaphoresis, LOC, polyuria, polydipsia  PERTINENT  PMH / PSH: HTN, h/o liver abscess, T2DM, ESRD on HDMWF, HLD, legally blind, mod protein-calorie malnutrition, s/p R BKA  OBJECTIVE:  BP 130/60   Pulse 92   Ht 5' (1.524 m)   Wt 214 lb 6 oz (97.2 kg)   LMP 03/07/2019   SpO2 99%   BMI 41.87 kg/m   General: NAD, pleasant, wheelchair-bound with s/p R BKA Neck: Supple Respiratory: normal work of breathing Psych: AOx3, appropriate affect   ASSESSMENT/PLAN:   No problem-specific Assessment & Plan notes found for this encounter.  Health maintenance: Placed referral to gastroenterology for patient to have colonoscopy completed.  She no showed last time due to transportation issues. Patient encouraged to follow-up for her eye exam and provided with a number to call for Dr. Posey Pronto who she is supposed to see. Patient encouraged to schedule Pap smear  Order placed for patient to have mammogram  Martinique Bayden Gil, DO PGY-3, [place]

## 2020-06-06 ENCOUNTER — Ambulatory Visit: Payer: Self-pay | Admitting: *Deleted

## 2020-06-06 ENCOUNTER — Telehealth: Payer: Self-pay | Admitting: Family Medicine

## 2020-06-06 DIAGNOSIS — E1139 Type 2 diabetes mellitus with other diabetic ophthalmic complication: Secondary | ICD-10-CM

## 2020-06-06 DIAGNOSIS — Z794 Long term (current) use of insulin: Secondary | ICD-10-CM

## 2020-06-06 NOTE — Chronic Care Management (AMB) (Signed)
  Chronic Care Management   Note  06/06/2020 Name: [name] MRN: [id number] DOB: Aug 07, 1966  [name] is a 54 y.o. year old female who is a primary care patient of [name], MD. I reached out to [name] by phone today in response to a referral sent by [name] PCP, [name], MD.     [name] was given information about Chronic Care Management services today including:  1. CCM service includes personalized support from designated clinical staff supervised by her physician, including individualized plan of care and coordination with other care providers 2. 24/7 contact phone numbers for assistance for urgent and routine care needs. 3. Service will only be billed when office clinical staff spend 20 minutes or more in a month to coordinate care. 4. Only one practitioner may furnish and bill the service in a calendar month. 5. The patient may stop CCM services at any time (effective at the end of the month) by phone call to the office staff. 6. The patient will be responsible for cost sharing (co-pay) of up to 20% of the service fee (after annual deductible is met).  Patient agreed to services and verbal consent obtained.   Follow up plan: Telephone appointment with care management team member scheduled for: 06/12/2020  [place] [facility]  [place], [place] [address] Direct Dial: [email or web address] Website: [email or web address]

## 2020-06-06 NOTE — Chronic Care Management (AMB) (Signed)
  Chronic Care Management   Note  06/06/2020 Name: [name] MRN: [id number] DOB: 06-03-1966   Collaborated with care guide team to facilitate assistance with medication assistance application.   Goals Addressed              This Visit's Progress   .  "I need help affording my medication" (pt-stated)        CARE PLAN ENTRY (see longitudinal plan of care for additional care plan information)  Current Barriers:  . Financial Constraints.  . Difficulty obtaining medications - patient requests assistance with Basaglar medication assistance application via manufacturer  Nurse Case Manager Clinical Goal(s):  [name] Over the next 14 days, patient will work with embedded care coordination care guide to address needs related to medication assistance application  Interventions:  . Inter-disciplinary care team collaboration (see longitudinal plan of care) . Care Guide referral for medication assistance application   Patient Self Care Activities:  . Calls provider office for new concerns or questions . Unable to independently procure needed medications  Initial goal documentation        Follow up plan: Telephone follow up appointment with care management team member scheduled for: 06/24/20  Janalyn Shy [place] Management Coordinator Direct Dial:  [phone]  Fax: [phone]

## 2020-06-13 ENCOUNTER — Encounter: Payer: Self-pay | Admitting: Internal Medicine

## 2020-06-18 ENCOUNTER — Other Ambulatory Visit: Payer: Self-pay | Admitting: Family Medicine

## 2020-06-24 ENCOUNTER — Ambulatory Visit: Payer: Medicare Other

## 2020-06-24 NOTE — Chronic Care Management (AMB) (Signed)
Care Management   Initial Visit Note  06/24/2020 Name: [name] MRN: [id number] DOB: Sep 17, 1966   Assessment: [name] is a 54 y.o. year old female who sees [name], MD for primary care. The care management team was consulted for assistance with care management and care coordination needs related to Disease Management Educational Needs for Type IIDM with ophthalmic complications, HTN,  and HF   Review of patient status, including review of consultants reports, relevant laboratory and other test results, and collaboration with appropriate care team members and the patient's provider was performed as part of comprehensive patient evaluation and provision of care management services.    SDOH (Social Determinants of Health) assessments performed: No See Care Plan activities for detailed interventions related to [facility])     Outpatient Encounter Medications as of 06/24/2020  Medication Sig Note  . amLODipine (NORVASC) 10 MG tablet TAKE 1 TABLET BY MOUTH ONCE DAILY   . atorvastatin (LIPITOR) 40 MG tablet TAKE 1 TABLET BY MOUTH ONCE DAILY   . blood glucose meter kit and supplies Dispense based on patient and insurance preference. Use up to four times daily as directed. (FOR ICD-10 E10.9, E11.9).   [name] gabapentin (NEURONTIN) 100 MG capsule TAKE 1 CAPSULE BY MOUTH ONCE DAILY AS NEEDED   . glucose blood test strip Use as instructed   . Insulin Isophane & Regular Human (HUMULIN 70/30 KWIKPEN) (70-30) 100 UNIT/ML PEN Inject 12 Units into the skin 2 (two) times daily.   . Insulin Pen Needle (EASY TOUCH PEN NEEDLES) 32G X 4 MM MISC 1 each by Does not apply route 3 (three) times daily.   Elmore Guise Devices (ONE TOUCH DELICA LANCING DEV) MISC Check blood sugars once daily   . Lancets (ONETOUCH ULTRASOFT) lancets Use as instructed   . losartan (COZAAR) 50 MG tablet Take 1 tablet (50 mg total) by mouth daily.   . pantoprazole (PROTONIX) 20 MG tablet TAKE 1 TABLET (20 MG TOTAL) BY MOUTH  DAILY.   . B Complex-C-Folic Acid (DIALYVITE 941) 0.8 MG TABS Take 1 tablet by mouth daily. (Patient not taking: Reported on 06/24/2020) 06/24/2020: Unsure if she is taking  . ferric citrate (AURYXIA) 1 GM 210 MG(Fe) tablet  (Patient not taking: Reported on 06/24/2020)   . Methoxy PEG-Epoetin Beta (MIRCERA) 100 MCG/0.3ML SOSY Mircera (Patient not taking: Reported on 06/24/2020)   . mupirocin cream (BACTROBAN) 2 % Apply 1 application topically 2 (two) times daily. (Patient not taking: Reported on 12/12/2019)   . promethazine (PHENERGAN) 12.5 MG tablet Take 1 tablet (12.5 mg total) by mouth every 6 (six) hours as needed for up to 10 doses for nausea or vomiting. (Patient not taking: Reported on 12/12/2019)   . Sodium Chloride Flush (SALINE FLUSH) 0.9 % SOLN Inject 5 mLs into the vein 3 (three) times daily. (Patient not taking: Reported on 12/12/2019)    No facility-administered encounter medications on file as of 06/24/2020.    Goals Addressed              This Visit's Progress   .  "I am unable to check my blood sugars my boyfriend checks for me." (pt-stated)        CARE PLAN ENTRY (see longitudinal plan of care for additional care plan information)  Current Barriers:  . Chronic Disease Management support and education needs related to Type IIDM with ophthalmic complications, HTN, HF .   Nurse Case Manager Clinical Goal(s):  [name] Over the next 90 days, patient will  verbalize understanding of plan for to lower her A!C 1-2 points  Interventions:  . Inter-disciplinary care team collaboration (see longitudinal plan of care) . Evaluation of current treatment plan related to Type IIDM with ophthalmic complications, and patient's adherence to plan as established by provider. . Reviewed medications with patient and discussed how she checks her blood sugars and gives her self insulin.  She states that her Boyfriend Legrand Como draws up her insulin for her and she will inject it and her checks her blood sugar due  to her vision. Nash Dimmer with Seth Bake regarding at Dr. Serita Grit Office  . Discussed plans with patient for ongoing care management follow up and provided patient with direct contact information for care management team . Reviewed scheduled/upcoming provider appointments including: RNCM helped schedule appointment due to patients vision and scheduled transportation 07/03/20 @ 1 pm with Dr Posey Pronto [address] [place]  [phone] . Pharmacy referral for [place] for medication assistance- to have medication mailed to patient due to her vision   Patient Self Care Activities:  . Patient verbalizes understanding of plan Attends all scheduled provider appointments . Unable to independently manage chronic conditions  Initial goal documentation         Follow up plan:  The care management team will reach out to the patient again over the next 14 days.  The patient has been provided with contact information for the care management team and has been advised to call with any health related questions or concerns.     Lazaro Arms RN, BSN, [facility] Care Management Coordinator [place] Phone: [phone] Fax: [phone]

## 2020-06-24 NOTE — Patient Instructions (Signed)
Visit Information  Goals Addressed              This Visit's Progress   .  "I am unable to check my blood sugars my boyfriend checks for me." (pt-stated)        CARE PLAN ENTRY (see longitudinal plan of care for additional care plan information)  Current Barriers:  . Chronic Disease Management support and education needs related to Type IIDM with ophthalmic complications, HTN, HF .   Nurse Case Manager Clinical Goal(s):  [name] Over the next 90 days, patient will verbalize understanding of plan for to lower her A!C 1-2 points  Interventions:  . Inter-disciplinary care team collaboration (see longitudinal plan of care) . Evaluation of current treatment plan related to Type IIDM with ophthalmic complications, and patient's adherence to plan as established by provider. . Reviewed medications with patient and discussed how she checks her blood sugars and gives her self insulin.  She states that her Boyfriend Legrand Como draws up her insulin for her and she will inject it and her checks her blood sugar due to her vision. Nash Dimmer with Seth Bake regarding at Dr. Serita Grit Office  . Discussed plans with patient for ongoing care management follow up and provided patient with direct contact information for care management team . Reviewed scheduled/upcoming provider appointments including: RNCM helped schedule appointment due to patients vision and scheduled transportation 07/03/20 @ 1 pm with Dr Posey Pronto [address] [place]  [phone] . Pharmacy referral for [place] for medication assistance- to have medication mailed to patient due to her vision   Patient Self Care Activities:  . Patient verbalizes understanding of plan Attends all scheduled provider appointments . Unable to independently manage chronic conditions  Initial goal documentation         The patient verbalized understanding of instructions provided today and declined a print copy of patient  instruction materials.   The care management team will reach out to the patient again over the next 14 days.  The patient has been provided with contact information for the care management team and has been advised to call with any health related questions or concerns.   Lazaro Arms RN, BSN, [facility] Care Management Coordinator [place] Phone: [phone] Fax: [phone]

## 2020-06-26 ENCOUNTER — Ambulatory Visit
Admission: RE | Admit: 2020-06-26 | Discharge: 2020-06-26 | Disposition: A | Discharge status: Home / Self Care | Payer: Medicare Other | Source: Ambulatory Visit | Attending: Family Medicine | Admitting: Family Medicine

## 2020-06-26 ENCOUNTER — Other Ambulatory Visit: Payer: Self-pay

## 2020-06-26 DIAGNOSIS — Z1231 Encounter for screening mammogram for malignant neoplasm of breast: Secondary | ICD-10-CM

## 2020-07-03 DIAGNOSIS — H31091 Other chorioretinal scars, right eye: Secondary | ICD-10-CM | POA: Diagnosis not present

## 2020-07-03 DIAGNOSIS — H3582 Retinal ischemia: Secondary | ICD-10-CM | POA: Diagnosis not present

## 2020-07-03 DIAGNOSIS — E113591 Type 2 diabetes mellitus with proliferative diabetic retinopathy without macular edema, right eye: Secondary | ICD-10-CM | POA: Diagnosis not present

## 2020-07-03 DIAGNOSIS — H4051X2 Glaucoma secondary to other eye disorders, right eye, moderate stage: Secondary | ICD-10-CM | POA: Diagnosis not present

## 2020-07-03 DIAGNOSIS — H26491 Other secondary cataract, right eye: Secondary | ICD-10-CM | POA: Diagnosis not present

## 2020-07-03 DIAGNOSIS — H4052X3 Glaucoma secondary to other eye disorders, left eye, severe stage: Secondary | ICD-10-CM | POA: Diagnosis not present

## 2020-07-03 DIAGNOSIS — H5712 Ocular pain, left eye: Secondary | ICD-10-CM | POA: Diagnosis not present

## 2020-07-03 DIAGNOSIS — H16402 Unspecified corneal neovascularization, left eye: Secondary | ICD-10-CM | POA: Diagnosis not present

## 2020-07-08 ENCOUNTER — Other Ambulatory Visit: Payer: Self-pay

## 2020-07-08 DIAGNOSIS — E1139 Type 2 diabetes mellitus with other diabetic ophthalmic complication: Secondary | ICD-10-CM

## 2020-07-08 DIAGNOSIS — Z794 Long term (current) use of insulin: Secondary | ICD-10-CM

## 2020-07-10 MED ORDER — GLUCOSE BLOOD VI STRP: ORAL_STRIP | 12 refills | Status: DC

## 2020-07-10 MED ORDER — SALINE FLUSH 0.9 % IV SOLN: 5.0000 mL | Freq: Three times a day (TID) | INTRAVENOUS | 0 refills | Status: DC

## 2020-07-10 MED ORDER — ONETOUCH DELICA LANCING DEV MISC: 0 refills | Status: DC

## 2020-07-10 MED ORDER — BLOOD GLUCOSE METER KIT: PACK | 0 refills | Status: DC

## 2020-07-11 ENCOUNTER — Ambulatory Visit: Payer: Medicare Other

## 2020-07-11 ENCOUNTER — Other Ambulatory Visit: Payer: Self-pay

## 2020-07-14 NOTE — Patient Instructions (Signed)
Visit Information  Goals Addressed              This Visit's Progress   .  "I am unable to check my blood sugars my boyfriend checks for me." (pt-stated)        CARE PLAN ENTRY (see longitudinal plan of care for additional care plan information)  Current Barriers:  . Chronic Disease Management support and education needs related to Type IIDM with ophthalmic complications, HTN, HF .   Nurse Case Manager Clinical Goal(s):  [name] Over the next 90 days, patient will verbalize understanding of plan for to lower her A!C 1-2 points  Interventions:  . Inter-disciplinary care team collaboration (see longitudinal plan of care) . Evaluation of current treatment plan related to Type IIDM with ophthalmic complications, and patient's adherence to plan as established by provider. . Reviewed medications with patient and discussed how she checks her blood sugars and gives her self insulin.  She states that her Boyfriend Legrand Como draws up her insulin for her and she will inject it and her checks her blood sugar due to her vision. Nash Dimmer with Seth Bake regarding at Dr. Serita Grit Office  . Discussed plans with patient for ongoing care management follow up and provided patient with direct contact information for care management team . Reviewed scheduled/upcoming provider appointments including: RNCM helped schedule appointment due to patients vision and scheduled transportation 07/03/20 @ 1 pm with Dr Posey Pronto [address] [place]  [phone] . Pharmacy referral for [place] for medication assistance- to have medication mailed to patient due to her vision  . 07/11/20 . Spoke with the patient today and she states that she has not been checking her blood sugars because she does not have her meter yet. . She states that she called the pharmacy and could not understand what was going on.  They told her she would have to pay out of pocket for her supplies.  RNCM called the pharmacy with  the patient on the phone and spoke with Daylan.  He stated that the patient needed a medical necessity paper filled out and the office did that for her and now they were waiting on the insurance company. . She states that she is feeling fine no problems.  RNCM has mailed the patient one of my cards to have my number due to she is unable to write the number down.  I will follow up with her in 2 weeks to make sure she has received her glucometer.  She know to call the office if she has any problems.  Patient Self Care Activities:  . Patient verbalizes understanding of plan Attends all scheduled provider appointments . Unable to independently manage chronic conditions  Initial goal documentation        Ms. Forbush was given information about Care Management services today including:  1. Care Management services include personalized support from designated clinical staff supervised by her physician, including individualized plan of care and coordination with other care providers 2. 24/7 contact phone numbers for assistance for urgent and routine care needs. 3. The patient may stop CCM services at any time (effective at the end of the month) by phone call to the office staff.  Patient agreed to services and verbal consent obtained.   The patient verbalized understanding of instructions provided today and declined a print copy of patient instruction materials.   The care management team will reach out to the patient again over the next 14 days.  Lazaro Arms RN, BSN, [facility] Care Management Coordinator [place] Phone: [phone] Fax: [phone]

## 2020-07-14 NOTE — Chronic Care Management (AMB) (Signed)
Care Management   Follow Up Note   07/14/2020 Name: [name] MRN: [id number] DOB: March 23, 1966  Referred by: [name], MD Reason for referral : Chronic Care Management (DM II)   [name] is a 54 y.o. year old female who is a primary care patient of [name], MD. The care management team was consulted for assistance with care management and care coordination needs.    Review of patient status, including review of consultants reports, relevant laboratory and other test results, and collaboration with appropriate care team members and the patient's provider was performed as part of comprehensive patient evaluation and provision of chronic care management services.    SDOH (Social Determinants of Health) assessments performed: No See Care Plan activities for detailed interventions related to [facility])     Advanced Directives: See Care Plan and [employer] application for related entries.   Goals Addressed              This Visit's Progress   .  "I am unable to check my blood sugars my boyfriend checks for me." (pt-stated)        CARE PLAN ENTRY (see longitudinal plan of care for additional care plan information)  Current Barriers:  . Chronic Disease Management support and education needs related to Type IIDM with ophthalmic complications, HTN, HF .   Nurse Case Manager Clinical Goal(s):  [name] Over the next 90 days, patient will verbalize understanding of plan for to lower her A!C 1-2 points  Interventions:  . Inter-disciplinary care team collaboration (see longitudinal plan of care) . Evaluation of current treatment plan related to Type IIDM with ophthalmic complications, and patient's adherence to plan as established by provider. . Reviewed medications with patient and discussed how she checks her blood sugars and gives her self insulin.  She states that her Boyfriend Legrand Como draws up her insulin for her and she will inject it and her checks her blood sugar due  to her vision. Nash Dimmer with Seth Bake regarding at Dr. Serita Grit Office  . Discussed plans with patient for ongoing care management follow up and provided patient with direct contact information for care management team . Reviewed scheduled/upcoming provider appointments including: RNCM helped schedule appointment due to patients vision and scheduled transportation 07/03/20 @ 1 pm with Dr Posey Pronto [address] [place]  [phone] . Pharmacy referral for [place] for medication assistance- to have medication mailed to patient due to her vision  . 07/11/20 . Spoke with the patient today and she states that she has not been checking her blood sugars because she does not have her meter yet. . She states that she called the pharmacy and could not understand what was going on.  They told her she would have to pay out of pocket for her supplies.  RNCM called the pharmacy with the patient on the phone and spoke with Daylan.  He stated that the patient needed a medical necessity paper filled out and the office did that for her and now they were waiting on the insurance company. . She states that she is feeling fine no problems.  RNCM has mailed the patient one of my cards to have my number due to she is unable to write the number down.  I will follow up with her in 2 weeks to make sure she has received her glucometer.  She know to call the office if she has any problems.  Patient Self Care Activities:  . Patient verbalizes  understanding of plan Attends all scheduled provider appointments . Unable to independently manage chronic conditions  Initial goal documentation         The care management team will reach out to the patient again over the next 14 days.   Lazaro Arms RN, BSN, [facility] Care Management Coordinator [place] Phone: [phone] Fax: [phone]

## 2020-07-17 ENCOUNTER — Other Ambulatory Visit: Payer: Self-pay

## 2020-07-17 MED ORDER — GABAPENTIN 100 MG PO CAPS: 100.0000 mg | ORAL_CAPSULE | Freq: Every day | ORAL | 1 refills | Status: DC

## 2020-07-21 ENCOUNTER — Telehealth: Payer: Self-pay

## 2020-07-21 NOTE — Telephone Encounter (Signed)
07/21/20 Spoke with patient to complete [place] application for [employer] medication.  Emailed form to [employer], MD to complete the prescriber part of the application. Ambrose Mantle [phone]

## 2020-07-21 NOTE — Telephone Encounter (Signed)
Routed secure request to Dr. Chauncey Reading, patient's PCP. I will be happy to sign when complete.   Dorris Singh, MD  Family Medicine Teaching Service

## 2020-07-22 ENCOUNTER — Other Ambulatory Visit: Payer: Self-pay | Admitting: Family Medicine

## 2020-07-22 ENCOUNTER — Telehealth: Payer: Self-pay

## 2020-07-22 NOTE — Telephone Encounter (Signed)
The application is in my medication assistance box at the front office, signed by Dr. Owens Shark.  I left a message for pt that the application is missing patient's signature 07/22/20  11:49am.  Pt does have active Medicaid, so I am not sure why she would have completed an application if she does not qualify for the program?  Medicaid prefers Lantus, if appropriate the MD would have to change to Lantus-her copay would most likely be $3.

## 2020-07-22 NOTE — Telephone Encounter (Signed)
Completed necessary portions of application, securely routed to Dr Owens Shark for signature.  [name], MD [place] [facility], PGY-2

## 2020-07-23 ENCOUNTER — Other Ambulatory Visit: Payer: Self-pay | Admitting: Family Medicine

## 2020-07-23 DIAGNOSIS — E1139 Type 2 diabetes mellitus with other diabetic ophthalmic complication: Secondary | ICD-10-CM

## 2020-07-23 DIAGNOSIS — Z794 Long term (current) use of insulin: Secondary | ICD-10-CM

## 2020-07-23 MED ORDER — INSULIN GLARGINE 100 UNIT/ML SOLOSTAR PEN: 12.0000 [IU] | PEN_INJECTOR | Freq: Two times a day (BID) | SUBCUTANEOUS | 11 refills | Status: DC

## 2020-07-23 NOTE — Telephone Encounter (Signed)
Per pharmacy recommendations, patient has medicaid, so co-pay for lantus is $3. Ordered lantus instead of Lily cares application for basaglar.  [name], MD [place] [facility], PGY-2

## 2020-07-30 ENCOUNTER — Other Ambulatory Visit: Payer: Self-pay

## 2020-07-30 ENCOUNTER — Ambulatory Visit: Payer: Medicare Other

## 2020-07-30 NOTE — Chronic Care Management (AMB) (Signed)
Care Management   Follow Up Note   07/30/2020 Name: [name] MRN: [id number] DOB: Oct 08, 1966  Referred by: [name], MD Reason for referral : Chronic Care Management (DM II)   [name] is a 54 y.o. year old female who is a primary care patient of [name], MD. The care management team was consulted for assistance with care management and care coordination needs.    Review of patient status, including review of consultants reports, relevant laboratory and other test results, and collaboration with appropriate care team members and the patient's provider was performed as part of comprehensive patient evaluation and provision of chronic care management services.    SDOH (Social Determinants of Health) assessments performed: No See Care Plan activities for detailed interventions related to [facility])     Advanced Directives: See Care Plan and Vynca application for related entries.   Goals Addressed              This Visit's Progress   .  "I am unable to check my blood sugars my boyfriend checks for me." (pt-stated)        CARE PLAN ENTRY (see longitudinal plan of care for additional care plan information)  Current Barriers:  . Chronic Disease Management support and education needs related to Type IIDM with ophthalmic complications, HTN, HF .   Nurse Case Manager Clinical Goal(s):  [name] Over the next 90 days, patient will verbalize understanding of plan for to lower her A!C 1-2 points  Interventions:  . Inter-disciplinary care team collaboration (see longitudinal plan of care) . Evaluation of current treatment plan related to Type IIDM with ophthalmic complications, and patient's adherence to plan as established by provider. . Reviewed medications with patient and discussed how she checks her blood sugars and gives her self insulin.  She states that her Boyfriend Legrand Como draws up her insulin for her and she will inject it and her checks her blood sugar due  to her vision. Nash Dimmer with Seth Bake regarding at Dr. Serita Grit [facility]  . Discussed plans with patient for ongoing care management follow up and provided patient with direct contact information for care management team . Reviewed scheduled/upcoming provider appointments including: RNCM helped schedule appointment due to patients vision and scheduled transportation 07/03/20 @ 1 pm with Dr Posey Pronto [address] [place]  [phone] . Pharmacy referral for [place] for medication assistance- to have medication mailed to patient due to her vision  . 07/30/20 . Spoke with the patient today and she is still not checking her blood sugars. She has not picked up her meter yet.  I advised her that the pharmacy should have her meter ready and she stated that she will send her son to pick the meter up. . The patient has all her medication at the is time and is taking them as prescribed.  She said that she is not having any side effects and feeling good.  She has been really focused on having the colonoscopy that she really has not been paying attention to any thing else.  Advised the patient to send her son to get the meter to start checking her blood sugars.  She received the letter that I sent her with my contact information so she can call me with any questions or concerns.  Patient Self Care Activities:  . Patient verbalizes understanding of plan Attends all scheduled provider appointments . Unable to independently manage chronic conditions  Please see past updates related to  this goal by clicking on the "Past Updates" button in the selected goal          The care management team will reach out to the patient again over the next 14 days.   Lazaro Arms RN, BSN, [facility] Care Management Coordinator [place] Phone: [phone] Fax: [phone]

## 2020-07-30 NOTE — Patient Instructions (Signed)
Visit Information  Goals Addressed              This Visit's Progress   .  "I am unable to check my blood sugars my boyfriend checks for me." (pt-stated)        CARE PLAN ENTRY (see longitudinal plan of care for additional care plan information)  Current Barriers:  . Chronic Disease Management support and education needs related to Type IIDM with ophthalmic complications, HTN, HF .   Nurse Case Manager Clinical Goal(s):  [name] Over the next 90 days, patient will verbalize understanding of plan for to lower her A!C 1-2 points  Interventions:  . Inter-disciplinary care team collaboration (see longitudinal plan of care) . Evaluation of current treatment plan related to Type IIDM with ophthalmic complications, and patient's adherence to plan as established by provider. . Reviewed medications with patient and discussed how she checks her blood sugars and gives her self insulin.  She states that her Boyfriend Legrand Como draws up her insulin for her and she will inject it and her checks her blood sugar due to her vision. Nash Dimmer with Seth Bake regarding at Dr. Serita [facility]rit [facility]  . Discussed plans with patient for ongoing care management follow up and provided patient with direct contact information for care management team . Reviewed scheduled/upcoming provider appointments including: RNCM helped schedule appointment due to patients vision and scheduled transportation 07/03/20 @ 1 pm with Dr Posey Pronto [address] [place]  [phone] . Pharmacy referral for [place] for medication assistance- to have medication mailed to patient due to her vision  . 07/30/20 . Spoke with the patient today and she is still not checking her blood sugars. She has not picked up her meter yet.  I advised her that the pharmacy should have her meter ready and she stated that she will send her son to pick the meter up. . The patient has all her medication at the is time and is taking them as  prescribed.  She said that she is not having any side effects and feeling good.  She has been really focused on having the colonoscopy that she really has not been paying attention to any thing else.  Advised the patient to send her son to get the meter to start checking her blood sugars.  She received the letter that I sent her with my contact information so she can call me with any questions or concerns.  Patient Self Care Activities:  . Patient verbalizes understanding of plan Attends all scheduled provider appointments . Unable to independently manage chronic conditions  Please see past updates related to this goal by clicking on the "Past Updates" button in the selected goal         Ms. Rocco was given information about Care Management services today including:  1. Care Management services include personalized support from designated clinical staff supervised by her physician, including individualized plan of care and coordination with other care providers 2. 24/7 contact phone numbers for assistance for urgent and routine care needs. 3. The patient may stop CCM services at any time (effective at the end of the month) by phone call to the office staff.  Patient agreed to services and verbal consent obtained.   The patient verbalized understanding of instructions provided today and declined a print copy of patient instruction materials.   The care management team will reach out to the patient again over the next 14 days.   Lazaro Arms RN, BSN, [facility]  Care Management Coordinator [place] Phone: [phone] Fax: [phone]

## 2020-08-14 ENCOUNTER — Encounter: Payer: Medicare Other | Admitting: Internal Medicine

## 2020-08-15 ENCOUNTER — Other Ambulatory Visit: Payer: Self-pay

## 2020-08-15 ENCOUNTER — Ambulatory Visit: Payer: Medicare Other

## 2020-08-15 MED ORDER — PANTOPRAZOLE SODIUM 20 MG PO TBEC: 20.0000 mg | DELAYED_RELEASE_TABLET | Freq: Every day | ORAL | 3 refills | Status: DC

## 2020-08-15 NOTE — Chronic Care Management (AMB) (Signed)
Care Management   Follow Up Note   08/15/2020 Name: [name] MRN: [id number] DOB: [birth year]  Referred by: [name] Reason for referral : Chronic Care Management (DM II)   [name] is a 54 y.o. year old female who is a primary care patient of [name]. The care management team was consulted for assistance with care management and care coordination needs.    Review of patient status, including review of consultants reports, relevant laboratory and other test results, and collaboration with appropriate care team members and the patient's provider was performed as part of comprehensive patient evaluation and provision of chronic care management services.    SDOH (Social Determinants of Health) assessments performed: No See Care Plan activities for detailed interventions related to [facility])     Advanced Directives: See Care Plan and Vynca application for related entries.   Goals Addressed              This Visit's Progress   .  "I am unable to check my blood sugars my boyfriend checks for me." (pt-stated)        CARE PLAN ENTRY (see longitudinal plan of care for additional care plan information)  Current Barriers:  . Chronic Disease Management support and education needs related to Type IIDM with ophthalmic complications, HTN, HF .   Nurse Case Manager Clinical Goal(s):  [name] Over the next 90 days, patient will verbalize understanding of plan for to lower her A!C 1-2 points  Interventions:  . Inter-disciplinary care team collaboration (see longitudinal plan of care) . Evaluation of current treatment plan related to Type IIDM with ophthalmic complications, and patient's adherence to plan as established by provider. . Reviewed medications with patient and discussed how she checks her blood sugars and gives her self insulin.  She states that her Boyfriend [name] draws up her insulin for her and she will inject it and her checks her blood sugar due  to her vision. Nash Dimmer with [name] regarding at [name] Office  . Discussed plans with patient for ongoing care management follow up and provided patient with direct contact information for care management team . Reviewed scheduled/upcoming provider appointments including: [name] helped schedule appointment due to patients vision and scheduled transportation 07/03/20 @ 1 pm with Dr Posey Pronto [address] [place]  [phone] . Pharmacy referral for [place] for medication assistance- to have medication mailed to patient due to her vision  . The patient states that she is feeling good no sign and symptoms of CHF.  Her son picked up her glucometer after Labor day but she has not used it yet to check her blood sugar. Encouraged the patient to check her blood sugars and record the values. . She went to her appointment with GI.  She states that she need to make another appointment because they feel that she may have to have her colonoscopy in the hospital . She states that she is going to call the office to make an appointment with her PCP. [name] The patient has all her medication and taking them as prescribed.        . Patient Self Care Activities:  . Patient verbalizes understanding of plan Attends all scheduled provider appointments . Unable to independently manage chronic conditions  Please see past updates related to this goal by clicking on the "Past Updates" button in the selected goal          The care management team will  reach out to the patient again over the next 14 days.   [name], BSN, [facility] Care Management Coordinator [place] Phone: [phone] Fax: [phone]

## 2020-08-15 NOTE — Patient Instructions (Signed)
Visit Information  Goals Addressed              This Visit's Progress   .  "I am unable to check my blood sugars my boyfriend checks for me." (pt-stated)        CARE PLAN ENTRY (see longitudinal plan of care for additional care plan information)  Current Barriers:  . Chronic Disease Management support and education needs related to Type IIDM with ophthalmic complications, HTN, HF .   Nurse Case Manager Clinical Goal(s):  [name] Over the next 90 days, patient will verbalize understanding of plan for to lower her A!C 1-2 points  Interventions:  . Inter-disciplinary care team collaboration (see longitudinal plan of care) . Evaluation of current treatment plan related to Type IIDM with ophthalmic complications, and patient's adherence to plan as established by provider. . Reviewed medications with patient and discussed how she checks her blood sugars and gives her self insulin.  She states that her Boyfriend Legrand Como draws up her insulin for her and she will inject it and her checks her blood sugar due to her vision. Nash Dimmer with Seth Bake regarding at Dr. Serita Grit [facility]  . Discussed plans with patient for ongoing care management follow up and provided patient with direct contact information for care management team . Reviewed scheduled/upcoming provider appointments including: RNCM helped schedule appointment due to patients vision and scheduled transportation 07/03/20 @ 1 pm with Dr Posey Pronto [address] [place]  [phone] . Pharmacy referral for [place] for medication assistance- to have medication mailed to patient due to her vision  . The patient states that she is feeling good no sign and symptoms of CHF.  Her son picked up her glucometer after Labor day but she has not used it yet to check her blood sugar. Encouraged the patient to check her blood sugars and record the values. . She went to her appointment with GI.  She states that she need to make another  appointment because they feel that she may have to have her colonoscopy in the hospital . She states that she is going to call the office to make an appointment with her PCP. [name] The patient has all her medication and taking them as prescribed.        . Patient Self Care Activities:  . Patient verbalizes understanding of plan Attends all scheduled provider appointments . Unable to independently manage chronic conditions  Please see past updates related to this goal by clicking on the "Past Updates" button in the selected goal         Ms. Brierley was given information about Care Management services today including:  1. Care Management services include personalized support from designated clinical staff supervised by her physician, including individualized plan of care and coordination with other care providers 2. 24/7 contact phone numbers for assistance for urgent and routine care needs. 3. The patient may stop CCM services at any time (effective at the end of the month) by phone call to the office staff.  Patient agreed to services and verbal consent obtained.   The patient verbalized understanding of instructions provided today and declined a print copy of patient instruction materials.   The care management team will reach out to the patient again over the next 14 days.   Lazaro Arms RN, BSN, [facility] Care Management Coordinator [place] Phone: [phone] Fax: [phone]

## 2020-08-28 ENCOUNTER — Ambulatory Visit: Payer: Self-pay | Admitting: Podiatry

## 2020-09-04 ENCOUNTER — Ambulatory Visit: Payer: Medicare Other

## 2020-09-04 NOTE — Chronic Care Management (AMB) (Signed)
Care Management   Follow Up Note   09/04/2020 Name: [name] MRN: [id number] DOB: [birth year]  Referred by: [name] Damme, MD Reason for referral : No chief complaint on file.   [name] is a 54 y.o. year old female who is a primary care patient of [name] Damme, MD. The care management team was consulted for assistance with care management and care coordination needs.    Review of patient status, including review of consultants reports, relevant laboratory and other test results, and collaboration with appropriate care team members and the patient's provider was performed as part of comprehensive patient evaluation and provision of chronic care management services.    SDOH (Social Determinants of Health) assessments performed: No See Care Plan activities for detailed interventions related to [facility])     Advanced Directives: See Care Plan and Vynca application for related entries.   Goals Addressed              This Visit's Progress   .  COMPLETED: "I am unable to check my blood sugars my boyfriend checks for me." (pt-stated)        CARE PLAN ENTRY (see longitudinal plan of care for additional care plan information)  Current Barriers:  . Chronic Disease Management support and education needs related to Type IIDM with ophthalmic complications, HTN, HF .   Nurse Case Manager Clinical Goal(s):  [name] Over the next 90 days, patient will verbalize understanding of plan for to lower her A!C 1-2 points  Interventions:  . Inter-disciplinary care team collaboration (see longitudinal plan of care) . Evaluation of current treatment plan related to Type IIDM with ophthalmic complications, and patient's adherence to plan as established by provider. . Reviewed medications with patient and discussed how she checks her blood sugars and gives her self insulin.  She states that her Boyfriend [name] draws up her insulin for her and she will inject it and her checks her blood  sugar due to her vision. Nash Dimmer with Seth Bake regarding at Dr. Serita Grit Office  . Discussed plans with patient for ongoing care management follow up and provided patient with direct contact information for care management team . Reviewed scheduled/upcoming provider appointments including: RNCM helped schedule appointment due to patients vision and scheduled transportation 07/03/20 @ 1 pm with Dr Posey Pronto [address] [place]  [phone] . Pharmacy referral for [place] for medication assistance- to have medication mailed to patient due to her vision  . The patient states that she is feeling good no sign and symptoms of CHF.  Her son picked up her glucometer after Labor day but she has not used it yet to check her blood sugar. Encouraged the patient to check her blood sugars and record the values. . She went to her appointment with GI.  She states that she need to make another appointment because they feel that she may have to have her colonoscopy in the hospital . She states that she is going to call the office to make an appointment with her PCP. [name] The patient has all her medication and taking them as prescribed.        . Patient Self Care Activities:  . Patient verbalizes understanding of plan Attends all scheduled provider appointments . Unable to independently manage chronic conditions  Please see past updates related to this goal by clicking on the "Past Updates" button in the selected goal  Resolving due to duplicate goal    .  Learn More  About My Health        Follow Up Date 10/05/20 - tell my story and reason for my visit - ask questions - repeat what I heard to make sure I understand - speak up when I don't understand    Why is this important?   The best way to learn about your health and care is by talking to the doctor and nurse.  They will answer your questions and give you information in the way that you like best.    Notes: The  patient  allows me to call and is very cooperative at each visit.  She is very informative and will ask questions when she does not understand information.  She has my contact information and has used it in the past when she has needed assistance.    .  Monitor and Manage My Blood Sugar        Follow Up Date 10/05/20   - check blood sugar at prescribed times - check blood sugar if I feel it is too high or too low - take the blood sugar meter to all doctor visits    Why is this important?   Checking your blood sugar at home helps to keep it from getting very high or very low.  Writing the results in a diary or log helps the doctor know how to care for you.  Your blood sugar log should have the time, date and the results.  Also, write down the amount of insulin or other medicine that you take.  Other information, like what you ate, exercise done and how you were feeling, will also be helpful.     Notes: Discussed with the patient about checking her blood sugars.  Due to the patient not being able to see her boyfriend checks her blood sugars.  She is on dialysis and she states that there are days that she will not take her medication before she goes.    .  Perform Foot Care        Follow Up Date 10/05/20   - check feet daily for cuts, sores or redness - trim toenails straight across - wear comfortable, cotton socks - wear comfortable, well-fitting shoes    Why is this important?   Good foot care is very important when you have diabetes.  There are many things you can do to keep your feet healthy and catch a problem early.    Notes: Discussed foot care and patient is seen in dialysis and her feet are checked there and by her boyfriend.  She is also seen by a podiatrist and has an appointment to have her toenails trimmed.        The care management team will reach out to the patient again over the next 14 days.   Lazaro Arms RN, BSN, [facility] Care Management Coordinator [place] Phone: [phone] Fax: [phone]

## 2020-09-04 NOTE — Patient Instructions (Signed)
Visit Information  Goals Addressed              This Visit's Progress   .  COMPLETED: "I am unable to check my blood sugars my boyfriend checks for me." (pt-stated)        CARE PLAN ENTRY (see longitudinal plan of care for additional care plan information)  Current Barriers:  . Chronic Disease Management support and education needs related to Type IIDM with ophthalmic complications, HTN, HF .   Nurse Case Manager Clinical Goal(s):  [name] Over the next 90 days, patient will verbalize understanding of plan for to lower her A!C 1-2 points  Interventions:  . Inter-disciplinary care team collaboration (see longitudinal plan of care) . Evaluation of current treatment plan related to Type IIDM with ophthalmic complications, and patient's adherence to plan as established by provider. . Reviewed medications with patient and discussed how she checks her blood sugars and gives her self insulin.  She states that her Boyfriend Legrand Como draws up her insulin for her and she will inject it and her checks her blood sugar due to her vision. Nash Dimmer with Seth Bake regarding at Dr. Serita Grit Office  . Discussed plans with patient for ongoing care management follow up and provided patient with direct contact information for care management team . Reviewed scheduled/upcoming provider appointments including: RNCM helped schedule appointment due to patients vision and scheduled transportation 07/03/20 @ 1 pm with Dr Posey Pronto [address] [place]  [phone] . Pharmacy referral for [place] for medication assistance- to have medication mailed to patient due to her vision  . The patient states that she is feeling good no sign and symptoms of CHF.  Her son picked up her glucometer after Labor day but she has not used it yet to check her blood sugar. Encouraged the patient to check her blood sugars and record the values. . She went to her appointment with GI.  She states that she need to  make another appointment because they feel that she may have to have her colonoscopy in the hospital . She states that she is going to call the office to make an appointment with her PCP. [name] The patient has all her medication and taking them as prescribed.        . Patient Self Care Activities:  . Patient verbalizes understanding of plan Attends all scheduled provider appointments . Unable to independently manage chronic conditions  Please see past updates related to this goal by clicking on the "Past Updates" button in the selected goal  Resolving due to duplicate goal    .  Learn More About My Health        Follow Up Date 10/05/20 - tell my story and reason for my visit - ask questions - repeat what I heard to make sure I understand - speak up when I don't understand    Why is this important?   The best way to learn about your health and care is by talking to the doctor and nurse.  They will answer your questions and give you information in the way that you like best.    Notes: The  patient allows me to call and is very cooperative at each visit.  She is very informative and will ask questions when she does not understand information.  She has my contact information and has used it in the past when she has needed assistance.    .  Monitor and Manage My Blood Sugar  Follow Up Date 10/05/20   - check blood sugar at prescribed times - check blood sugar if I feel it is too high or too low - take the blood sugar meter to all doctor visits    Why is this important?   Checking your blood sugar at home helps to keep it from getting very high or very low.  Writing the results in a diary or log helps the doctor know how to care for you.  Your blood sugar log should have the time, date and the results.  Also, write down the amount of insulin or other medicine that you take.  Other information, like what you ate, exercise done and how you were feeling, will also be helpful.       Notes: Discussed with the patient about checking her blood sugars.  Due to the patient not being able to see her boyfriend checks her blood sugars.  She is on dialysis and she states that there are days that she will not take her medication before she goes.    .  Perform Foot Care        Follow Up Date 10/05/20   - check feet daily for cuts, sores or redness - trim toenails straight across - wear comfortable, cotton socks - wear comfortable, well-fitting shoes    Why is this important?   Good foot care is very important when you have diabetes.  There are many things you can do to keep your feet healthy and catch a problem early.    Notes: Discussed foot care and patient is seen in dialysis and her feet are checked there and by her boyfriend.  She is also seen by a podiatrist and has an appointment to have her toenails trimmed.       Ms. Brendel was given information about Care Management services today including:  1. Care Management services include personalized support from designated clinical staff supervised by her physician, including individualized plan of care and coordination with other care providers 2. 24/7 contact phone numbers for assistance for urgent and routine care needs. 3. The patient may stop CCM services at any time (effective at the end of the month) by phone call to the office staff.  Patient agreed to services and verbal consent obtained.   The patient verbalized understanding of instructions provided today and declined a print copy of patient instruction materials.   The care management team will reach out to the patient again over the next 14 days.   Lazaro Arms RN, BSN, [facility] Care Management Coordinator [place] Phone: [phone] Fax: [phone]

## 2020-09-09 ENCOUNTER — Other Ambulatory Visit (INDEPENDENT_AMBULATORY_CARE_PROVIDER_SITE_OTHER): Payer: Medicare Other

## 2020-09-09 ENCOUNTER — Encounter: Payer: Self-pay | Admitting: Nurse Practitioner

## 2020-09-09 ENCOUNTER — Ambulatory Visit (INDEPENDENT_AMBULATORY_CARE_PROVIDER_SITE_OTHER): Payer: Medicare Other | Admitting: Nurse Practitioner

## 2020-09-09 VITALS — BP 160/70 | HR 72 | Ht 67.0 in | Wt 213.0 lb

## 2020-09-09 DIAGNOSIS — D649 Anemia, unspecified: Secondary | ICD-10-CM | POA: Diagnosis not present

## 2020-09-09 DIAGNOSIS — Z1211 Encounter for screening for malignant neoplasm of colon: Secondary | ICD-10-CM | POA: Diagnosis not present

## 2020-09-09 LAB — IBC + FERRITIN
Ferritin: >1500 ng/mL — ABNORMAL HIGH (ref 10.0–291.0)
Iron: 106 ug/dL (ref 42–145)
Saturation Ratios: 38 % (ref 20.0–50.0)
Transferrin: 199 mg/dL — ABNORMAL LOW (ref 212.0–360.0)

## 2020-09-09 MED ORDER — SUTAB 1479-225-188 MG PO TABS: 1.0000 | ORAL_TABLET | Freq: Once | ORAL | 0 refills | Status: AC

## 2020-09-09 NOTE — Progress Notes (Signed)
ASSESSMENT AND PLAN     # Colon cancer screening --Patient will be scheduled for a colonoscopy. The risks and benefits of colonoscopy with possible polypectomy / biopsies were discussed and the patient agrees to proceed.  --Patient has a right BKA, is in a wheelchair but can stand / pivot with left left leg to get onto exam table. Therefore, will schedule procedure to be done at [facility].  --Two day bowel prep for colonoscopy as she has chronic constipation.  # GERD? Patient denies hx of GERD. Doesn't know why she is on Protonix. No hx of PUD. Not on blood thinners --We discussed her use of protonix. She can try stopping it since she doesn't have any clear cut indications for it.  She can always resume PPI therapy if she does get GERD symptoms.   # ESRD, on HD Monday, Wed, Friday.   # Chronic Celoron anemia ( previously microcytic) .  --Hgb 11.3 in April, MCV upper 70s to 83.  --Says she gets IV iron at dialysis. She also takes ferric citrate  --Stool dark but heme negative on exam.   --No upper GI symptoms. --Anemia likely from CKD but will check iron studies. If iron deficient consider adding on EGD to colonoscopy. [name]    HISTORY OF PRESENT ILLNESS     Primary Gastroenterologist : [name] Shorts, MD   Chief Complaint : colon cancer screening  [name] is a 54 y.o. female with PMH / [place] significant for,  but not necessarily limited to: DM, ESRD on HD, right BKA  Patient is referred by PCP for colon cancer screening. She was scheduled for a direct colonoscopy on 9/9 at [facility] with Dr. Henrene Pastor. Procedure was cancelled pending need to evaluate her ability of ambulate. She has a right BKA and is in a wheelchair but can pivot / transfer to the bed.standing on left leg   Patient has never had a colonoscopy. She relies on laxatives to have a BM. Takes Dulcolax twice weekly. Presence of any blood in stool is unknown as she is visually impaired.  No [place] of colon cancer.   Patient takes Protonix  but she doesn't know why. She denies any history of GERD. No history of PUD. Not on blood thinners.    Data Reviewed: 03/20/20 Hgb 11.3   Past Medical History:  Diagnosis Date  . Alcohol dependency ([place])   . Anemia   . Appendicitis 12/06/2018   type 2  . Diabetes mellitus type 2, uncontrolled ([place])   . End stage renal disease ([place]) 05/05/2016   Starting 04/2016, LUE fistula  . Enterococcal bacteremia   . Hypertension   . Legally blind in left eye, as defined in [place]   . Liver abscess 12/20/2018  . Necrotizing fasciitis ([place])   . PONV (postoperative nausea and vomiting)   . Sepsis without acute organ dysfunction ([place])   . Type 2 diabetes mellitus with diabetic chronic kidney disease ([place]) 05/05/2016     Past Surgical History:  Procedure Laterality Date  . AMPUTATION     right first and left middle finger amputation 2/2 OM 08/2011 by Dr. Amedeo Plenty  . AMPUTATION  01/18/2012   Procedure: AMPUTATION DIGIT;  Surgeon: Tennis Must, MD;  Location: [place];  Service: Orthopedics;  Laterality: Left;  revision amputation left thumb  . AMPUTATION  08/25/2012   Procedure: AMPUTATION BELOW KNEE;  Surgeon: Newt Minion, MD;  Location: [place];  Service: Orthopedics;  Laterality: Right;  Right Below Knee Amputation  . APPENDECTOMY    . BASCILIC VEIN TRANSPOSITION Left 07/22/2015   Procedure: BASILIC VEIN TRANSPOSITION ;  Surgeon: Elam Dutch, MD;  Location: [place];  Service: Vascular;  Laterality: Left;  . CATARACT EXTRACTION     right   . CESAREAN SECTION  1986  . CESAREAN SECTION  1985  . CESAREAN SECTION  1991  . I & D EXTREMITY  12/24/2011   Procedure: IRRIGATION AND DEBRIDEMENT EXTREMITY;  Surgeon: Tennis Must, MD;  Location: [place];  Service: Orthopedics;  Laterality: Left;  I&Dleft thumb with partial amputation  . I & D EXTREMITY  12/26/2011   Procedure: IRRIGATION AND DEBRIDEMENT EXTREMITY;  Surgeon: Tennis Must, MD;  Location: [place];  Service: Orthopedics;   Laterality: Left;  . I & D EXTREMITY  12/23/2011   Procedure: IRRIGATION AND DEBRIDEMENT EXTREMITY;  Surgeon: Tennis Must, MD;  Location: [place];  Service: Orthopedics;  Laterality: Left;  I&D Left thumb, hand and arm  . I&D left thumb  12/23/2011  . IR RADIOLOGIST EVAL & MGMT  01/04/2019  . IR RADIOLOGIST EVAL & MGMT  01/25/2019  . LAPAROSCOPIC APPENDECTOMY N/A 12/06/2018   Procedure: APPENDECTOMY LAPAROSCOPIC;  Surgeon: Kinsinger, Arta Bruce, MD;  Location: [place];  Service: General;  Laterality: N/A;  . TUBAL LIGATION     Family History  Problem Relation Age of Onset  . Diabetes Mother   . Alcohol abuse Mother   . Diabetes Father   . Alzheimer's disease Father   . Hypertension Father   . Kidney failure Sister   . Diabetes Sister   . High blood pressure Sister    Social History   Tobacco Use  . Smoking status: Never Smoker  . Smokeless tobacco: Never Used  Vaping Use  . Vaping Use: Never used  Substance Use Topics  . Alcohol use: Yes    Comment: h/o alcohol abuse 4 years ago (07/21/15)  . Drug use: Yes    Types: Cocaine    Comment:  last used cocaine 4 years ago (07/21/15)   Current Outpatient Medications  Medication Sig Dispense Refill  . amLODipine (NORVASC) 10 MG tablet TAKE 1 TABLET BY MOUTH ONCE DAILY 90 tablet 3  . atorvastatin (LIPITOR) 40 MG tablet TAKE 1 TABLET BY MOUTH ONCE DAILY 90 tablet 3  . B Complex-C-Folic Acid (DIALYVITE 505) 0.8 MG TABS Take 1 tablet by mouth daily.   11  . blood glucose meter kit and supplies Dispense based on patient and insurance preference. Use up to four times daily as directed. (FOR ICD-10 E10.9, E11.9). 1 each 0  . ferric citrate (AURYXIA) 1 GM 210 MG(Fe) tablet     . gabapentin (NEURONTIN) 100 MG capsule Take 1 capsule (100 mg total) by mouth daily. 90 capsule 1  . glucose blood test strip Use as instructed, for testing blood 4 times daily. 100 each 12  . insulin glargine (LANTUS) 100 UNIT/ML Solostar Pen Inject 12 Units into the skin 2  (two) times daily. 3 mL 11  . Insulin Isophane & Regular Human (HUMULIN 70/30 KWIKPEN) (70-30) 100 UNIT/ML PEN Inject 12 Units into the skin 2 (two) times daily.    . Insulin Pen Needle (EASY TOUCH PEN NEEDLES) 32G X 4 MM MISC 1 each by Does not apply route 3 (three) times daily. 100 each 11  . Lancet Devices (ONE TOUCH DELICA LANCING DEV) MISC Check blood sugars fourx daily 1 each 0  . Lancets [facility]  ULTRASOFT) lancets Use as instructed 100 each 12  . losartan (COZAAR) 50 MG tablet Take 1 tablet (50 mg total) by mouth daily. 90 tablet 2  . Methoxy PEG-Epoetin Beta (MIRCERA) 100 MCG/0.3ML SOSY Mircera    . mupirocin cream (BACTROBAN) 2 % Apply 1 application topically 2 (two) times daily. 15 g 0  . pantoprazole (PROTONIX) 20 MG tablet Take 1 tablet (20 mg total) by mouth daily. 90 tablet 3  . promethazine (PHENERGAN) 12.5 MG tablet Take 1 tablet (12.5 mg total) by mouth every 6 (six) hours as needed for up to 10 doses for nausea or vomiting. 10 tablet 0  . Sodium Chloride Flush (SALINE FLUSH) 0.9 % SOLN Inject 5 mLs into the vein 3 (three) times daily. 500 mL 0   No current facility-administered medications for this visit.   No Known Allergies   Review of Systems:  All systems reviewed and negative except where noted in HPI.   PHYSICAL EXAM :    Wt Readings from Last 3 Encounters:  09/09/20 213 lb (96.6 kg)  06/03/20 214 lb 6 oz (97.2 kg)  03/20/20 213 lb (96.6 kg)    BP (!) 160/70   Pulse 72   Ht '5\' 7"'  (1.702 m)   Wt 213 lb (96.6 kg)   LMP 03/07/2019   BMI 33.36 kg/m  Constitutional:  Pleasant female in no acute distress. Psychiatric: Normal mood and affect. Behavior is normal. EENT: Pupils normal.  Conjunctivae are normal. No scleral icterus. Neck supple.  Cardiovascular: Normal rate, regular rhythm. No edema Pulmonary/chest: Effort normal and breath sounds normal. No wheezing, rales or rhonchi. Abdominal: In wheelchair. Soft, nondistended, nontender. Bowel sounds active  throughout.  Rectal: scant amount of dark heme negative stool in vault. Neurological: Alert and oriented to person place and time. Skin: Skin is warm and dry. No rashes noted.  Tye Savoy, NP  09/09/2020, 10:53 AM   CC: Referring Provider Madison Hickman, MD

## 2020-09-09 NOTE — Patient Instructions (Addendum)
If you are age 53 or older, your body mass index should be between 23-30. Your Body mass index is 33.36 kg/m. If this is out of the aforementioned range listed, please consider follow up with your Primary Care Provider.  If you are age 56 or younger, your body mass index should be between 19-25. Your Body mass index is 33.36 kg/m. If this is out of the aformentioned range listed, please consider follow up with your Primary Care Provider.   Your provider has requested that you go to the basement level for lab work before leaving today. Press "B" on the elevator. The lab is located at the first door on the left as you exit the elevator.  You have been scheduled for a colonoscopy. Please follow written instructions given to you at your visit today.  Please pick up your prep supplies at the pharmacy within the next 1-3 days. If you use inhalers (even only as needed), please bring them with you on the day of your procedure.  Due to recent changes in healthcare laws, you may see the results of your imaging and laboratory studies on MyChart before your provider has had a chance to review them.  We understand that in some cases there may be results that are confusing or concerning to you. Not all laboratory results come back in the same time frame and the provider may be waiting for multiple results in order to interpret others.  Please give [place] 48 hours in order for your provider to thoroughly review all the results before contacting the office for clarification of your results.

## 2020-09-10 NOTE — Progress Notes (Signed)
Assessment and plan noted ?

## 2020-09-15 ENCOUNTER — Telehealth: Payer: Self-pay

## 2020-09-15 NOTE — Telephone Encounter (Signed)
Patient calls nurse line regarding elevated BP readings. Patient reports that at last three dialysis appointments, BP has been ranging 301O to 996L systolic. Patient does not have diastolic readings. Patient denies symptoms of high blood pressure at this time. Scheduled patient with Dr. Pilar Plate for next Tuesday for further evaluation. Attempted to schedule patient sooner, however, patient takes transportation and Tuesday is the next day she will be available. Strict return/ ED precautions given in the meantime.   Will route to PCP and Dr. Thornton Park, RN

## 2020-09-18 ENCOUNTER — Telehealth: Payer: Self-pay

## 2020-09-18 ENCOUNTER — Telehealth: Payer: Medicare Other

## 2020-09-18 NOTE — Telephone Encounter (Signed)
  Care Management   Outreach Note  09/18/2020 Name: [name] MRN: [id number] DOB: [birth year]  Referred by: [name], MD Reason for referral : Appointment (DM II)   An unsuccessful telephone outreach was attempted today. The patient was referred to the case management team for assistance with care management and care coordination.   Follow Up Plan: A HIPAA compliant phone message was left for the patient providing contact information and requesting a return call.  The care management team will reach out to the patient again over the next 7-14 days.   Lazaro Arms RN, BSN, [facility] Care Management Coordinator [place] Phone: [phone] Fax: [phone]

## 2020-09-23 ENCOUNTER — Ambulatory Visit (INDEPENDENT_AMBULATORY_CARE_PROVIDER_SITE_OTHER): Payer: Medicare Other | Admitting: Family Medicine

## 2020-09-23 ENCOUNTER — Encounter: Payer: Self-pay | Admitting: Family Medicine

## 2020-09-23 ENCOUNTER — Other Ambulatory Visit: Payer: Self-pay

## 2020-09-23 VITALS — BP 140/62 | HR 77 | Ht 67.0 in

## 2020-09-23 DIAGNOSIS — Z794 Long term (current) use of insulin: Secondary | ICD-10-CM | POA: Diagnosis not present

## 2020-09-23 DIAGNOSIS — E1139 Type 2 diabetes mellitus with other diabetic ophthalmic complication: Secondary | ICD-10-CM

## 2020-09-23 DIAGNOSIS — K5900 Constipation, unspecified: Secondary | ICD-10-CM | POA: Diagnosis not present

## 2020-09-23 DIAGNOSIS — I159 Secondary hypertension, unspecified: Secondary | ICD-10-CM | POA: Diagnosis not present

## 2020-09-23 LAB — POCT GLYCOSYLATED HEMOGLOBIN (HGB A1C): HbA1c, POC (controlled diabetic range): 7.2 % — AB (ref 0.0–7.0)

## 2020-09-23 MED ORDER — SENNOSIDES-DOCUSATE SODIUM 8.6-50 MG PO TABS: 1.0000 | ORAL_TABLET | Freq: Every day | ORAL | 2 refills | Status: DC

## 2020-09-23 NOTE — Progress Notes (Signed)
    SUBJECTIVE:   CHIEF COMPLAINT / HPI:   Diabetes A1c today 7.2 increased from 7.03 months ago.  Her current diabetes regimen includes: -Lantus 12 units twice daily -Losartan 50 mg daily Atorvastatin 40 mg daily Her fasting CBGs have been 140-160  Hypertension Her current blood pressure medicine includes: -Losartan 50 mg -Amlodipine 10 mg She reports that her blood pressures immediately prior to dialysis have had systolic measurements as high as 190 or 200. She has discussed this with her nephrologist who is recommended taking off additional fluid during dialysis.  Health maintenance She has that she is due for colonoscopy and has one scheduled in the coming months.  Constipation She reports that she has bowel movements roughly twice per week which often include straining and hard stools. She denies nausea, vomiting. She is not interested in taking MiraLAX and would like to know what other options are available.  PERTINENT  PMH / PSH: ESRD, diabetes  OBJECTIVE:   BP 140/62   Pulse 77   Ht 5\' 7"  (1.702 m)   LMP 03/07/2019   SpO2 100%   BMI 33.36 kg/m    General: Seated comfortably in her wheelchair in no acute distress. Cardio: Normal S1 and S2, no S3 or S4. Rhythm is regular. No murmurs or rubs.   Pulm: Clear to auscultation bilaterally, no crackles, wheezing, or diminished breath sounds. Normal respiratory effort Abdomen: Bowel sounds normal. Abdomen soft and non-tender.  Extremities: No peripheral edema. Warm/ well perfused.  Strong radial pulse.  ASSESSMENT/PLAN:   Type 2 diabetes mellitus (HCC) Mildly elevated A1c today at 7.3 elevated from 7.0 several months ago. -Increase Lantus to 14 units twice daily, decreased to 13 units twice daily if there is trouble with low CBGs -Follow-up in 3 months  Constipation Discussed increasing fiber in the diet -Senokot daily as needed  Secondary hypertension, unspecified Stable blood pressure in clinic today. We  discussed that her blood pressure looks appropriate in clinic today and providing additional blood pressure medications may drop her blood pressure too much at dialysis. It sounds like nephrology has already taken appropriate steps to help reduce these occasionally elevated systolic measurements. No medication changes at this time. We will continue to monitor. -Continue amlodipine and losartan     Matilde Haymaker, MD [place]

## 2020-09-23 NOTE — Assessment & Plan Note (Signed)
Mildly elevated A1c today at 7.3 elevated from 7.0 several months ago. -Increase Lantus to 14 units twice daily, decreased to 13 units twice daily if there is trouble with low CBGs -Follow-up in 3 months

## 2020-09-23 NOTE — Assessment & Plan Note (Signed)
Discussed increasing fiber in the diet -Senokot daily as needed

## 2020-09-23 NOTE — Assessment & Plan Note (Signed)
Stable blood pressure in clinic today. We discussed that her blood pressure looks appropriate in clinic today and providing additional blood pressure medications may drop her blood pressure too much at dialysis. It sounds like nephrology has already taken appropriate steps to help reduce these occasionally elevated systolic measurements. No medication changes at this time. We will continue to monitor. -Continue amlodipine and losartan

## 2020-09-23 NOTE — Patient Instructions (Signed)
Diabetes: I recommend that you increase your Lantus to 14 units in the morning and 14 units in the evening.  If you do feel that you have low blood sugars or your record fasting blood sugars under 90, I recommend that you decrease to 13 units in the morning and 13 units in evening.  Blood pressure: Your blood pressure appears well controlled today.  I am hesitant to increase your blood pressure medicines off because you may get too low at dialysis.  It sounds like you want to make some changes at dialysis to help reduce your blood pressure which may be helpful.  Health maintenance: Glad to hear you will be getting a colonoscopy soon.  Constipation: I have prescribed a medication for you called Senokot which you can take daily as needed for constipation.  I also recommend that you increase your daily intake of fiber with fruits and vegetables.

## 2020-09-25 ENCOUNTER — Ambulatory Visit: Payer: Medicare Other | Admitting: Podiatry

## 2020-09-25 NOTE — Telephone Encounter (Signed)
Called spoke with patient reschedule follow up call with Lazaro Arms RN CM on 10/02/2020

## 2020-10-02 ENCOUNTER — Ambulatory Visit: Payer: Medicare Other

## 2020-10-02 NOTE — Patient Instructions (Signed)
Visit Information  Goals Addressed              This Visit's Progress   .  "I noticed my blood pressure being up when at dialysis" (pt-stated)        [place] (see longtitudinal plan of care for additional care plan information)  Objective:  . Last practice recorded BP readings:  BP Readings from Last 3 Encounters:  09/23/20 140/62  09/09/20 (!) 160/70  06/03/20 130/60 .   Current Barriers:  [name] Knowledge Deficits related to basic understanding of hypertension pathophysiology and self care management  Case Manager Clinical Goal(s):  [name] Over the next 60 days, patient will verbalize understanding of plan for hypertension management  Interventions:  . Evaluation of current treatment plan related to hypertension self management and patient's adherence to plan as established by provider. . Discussed plans with patient for ongoing care management follow up and provided patient with direct contact information for care management team . Advised patient, providing education and rationale, to monitor blood pressure daily and record, calling PCP for findings outside established parameters.  . Discussed with the patient about lifestyle situations in which she noticed her BP would increase.  Relaxation techniques she can do to help lower her BP and to take her medications as prescribed.  Patient Self Care Activities:  . Self administers medications as prescribed . Attends all scheduled provider appointments . Calls provider office for new concerns, questions, or BP outside discussed parameters . Checks BP and records as discussed . Follows a low sodium diet/DASH diet  Initial goal documentation      .  Learn More About My Health   On track     Follow Up Date 12/05/20 - tell my story and reason for my visit - ask questions - repeat what I heard to make sure I understand - speak up when I don't understand    Why is this important?   The best way to learn about your health and  care is by talking to the doctor and nurse.  They will answer your questions and give you information in the way that you like best.    Notes: The  patient allows me to call and is very cooperative at each visit.  She is very informative and will ask questions when she does not understand information.  She has my contact information and has used it in the past when she has needed assistance.    .  Monitor and Manage My Blood Sugar   On track     Follow Up Date 12/05/20   - check blood sugar at prescribed times - check blood sugar if I feel it is too high or too low - take the blood sugar meter to all doctor visits    Why is this important?   Checking your blood sugar at home helps to keep it from getting very high or very low.  Writing the results in a diary or log helps the doctor know how to care for you.  Your blood sugar log should have the time, date and the results.  Also, write down the amount of insulin or other medicine that you take.  Other information, like what you ate, exercise done and how you were feeling, will also be helpful.     Notes: Discussed with the patient about checking her blood sugars.  Due to the patient not being able to see her boyfriend checks her blood sugars.  She is  on dialysis and she states that there are days that she will not take her medication before she goes. She understand how this will affect her numbers.    .  COMPLETED: Perform Foot Care   On track     Follow Up Date 10/05/20   - check feet daily for cuts, sores or redness - trim toenails straight across - wear comfortable, cotton socks - wear comfortable, well-fitting shoes    Why is this important?   Good foot care is very important when you have diabetes.  There are many things you can do to keep your feet healthy and catch a problem early.    Notes: Discussed foot care and patient is seen in dialysis and her feet are checked there and by her boyfriend.  She is also seen by a podiatrist  and has an appointment to have her toenails trimmed.       [name] was given information about Care Management services today including:  1. Care Management services include personalized support from designated clinical staff supervised by her physician, including individualized plan of care and coordination with other care providers 2. 24/7 contact phone numbers for assistance for urgent and routine care needs. 3. The patient may stop CCM services at any time (effective at the end of the month) by phone call to the office staff.  Patient agreed to services and verbal consent obtained.   The patient verbalized understanding of instructions provided today and declined a print copy of patient instruction materials.   Plan: Telephone follow up with [name] over the next 21 days.   [name] Arms RN, BSN, [facility] Care Management Coordinator [place] Phone: [phone] Fax: [phone]

## 2020-10-02 NOTE — Chronic Care Management (AMB) (Signed)
RN  Care Management   Follow Up Note   10/02/2020 Name: [name] MRN: [id number] DOB: 11-27-1966  Reason for referral : Chronic Care Management (DM II/ HTN)   [name] is a 54 y.o. year old female who is a primary care patient of [name] Damme, MD. The care management team was consulted for assistance with care management and care coordination needs.    Subjective: " I am doing ok"  Objective:  Lab Results  Component Value Date   HGBA1C 7.2 (A) 09/23/2020   BP Readings from Last 3 Encounters:  09/23/20 140/62  09/09/20 (!) 160/70  06/03/20 130/60   Assessment: Called the patient and followed up with her  on her DM II and HTN.  She stated that she noticed that her BP started being elevated on dialysis days and made an appointment in the office to be seen.  She realized that she was stressed from activities during the days recently of dialysis that elevated her BP.  Patient Care Plan: Diabetes Type 2 (Adult)    Problem Identified: Glycemic Management (Diabetes, Type 2)     Long-Range Goal: Glycemic Management Optimized     Task: Alleviate Barriers to Glycemic Management   Due Date: 12/04/2020  Note:   Care Management Activities:    - A1C testing facilitated - barriers to adherence to treatment plan identified - blood glucose monitoring encouraged - blood glucose readings reviewed - mutual A1C goal set or reviewed - resources required to improve adherence to care identified - use of blood glucose monitoring log promoted    Notes: patient recent a1c check was 7.2  an increase of 2 points.  She states that she is following her medication regime.  There have no lows.  Discuss hypoglycemic protocol. She is checking her blood sugars with the help of her boyfriend.    Problem Identified: Disease Progression (Diabetes, Type 2)     Long-Range Goal: Disease Progression Prevented or Minimized     Task: Monitor and Manage Follow-up for Comorbidities   Due  Date: 12/04/2020  Note:   Care Management Activities:    - completion of annual foot exam verified - healthy lifestyle promoted - quality of sleep assessed - response to pharmacologic therapy monitored - vital signs and trends reviewed    Notes:  Patient states that she sleeps well.  She is trying to learn to relax when things aggravate her.  She is  monitoring her diet and going to add fiber in to her diet as request at her last visit with the physician.  Patient also states that she will have her coloscopy on December 7th.        Goals Addressed              This Visit's Progress   .  "I noticed my blood pressure being up when at dialysis" (pt-stated)        [place] (see longtitudinal plan of care for additional care plan information)  Objective:  . Last practice recorded BP readings:  BP Readings from Last 3 Encounters:  09/23/20 140/62  09/09/20 (!) 160/70  06/03/20 130/60 .   Current Barriers:  [name] Knowledge Deficits related to basic understanding of hypertension pathophysiology and self care management  Case Manager Clinical Goal(s):  [name] Over the next 60 days, patient will verbalize understanding of plan for hypertension management  Interventions:  . Evaluation of current treatment plan related to hypertension self management and patient's adherence to  plan as established by provider. . Discussed plans with patient for ongoing care management follow up and provided patient with direct contact information for care management team . Advised patient, providing education and rationale, to monitor blood pressure daily and record, calling PCP for findings outside established parameters.  . Discussed with the patient about lifestyle situations in which she noticed her BP would increase.  Relaxation techniques she can do to help lower her BP and to take her medications as prescribed.  Patient Self Care Activities:  . Self administers medications as  prescribed . Attends all scheduled provider appointments . Calls provider office for new concerns, questions, or BP outside discussed parameters . Checks BP and records as discussed . Follows a low sodium diet/DASH diet  Initial goal documentation      .  Learn More About My Health   On track     Follow Up Date 12/05/20 - tell my story and reason for my visit - ask questions - repeat what I heard to make sure I understand - speak up when I don't understand    Why is this important?   The best way to learn about your health and care is by talking to the doctor and nurse.  They will answer your questions and give you information in the way that you like best.    Notes: The  patient allows me to call and is very cooperative at each visit.  She is very informative and will ask questions when she does not understand information.  She has my contact information and has used it in the past when she has needed assistance.    .  Monitor and Manage My Blood Sugar   On track     Follow Up Date 12/05/20   - check blood sugar at prescribed times - check blood sugar if I feel it is too high or too low - take the blood sugar meter to all doctor visits    Why is this important?   Checking your blood sugar at home helps to keep it from getting very high or very low.  Writing the results in a diary or log helps the doctor know how to care for you.  Your blood sugar log should have the time, date and the results.  Also, write down the amount of insulin or other medicine that you take.  Other information, like what you ate, exercise done and how you were feeling, will also be helpful.     Notes: Discussed with the patient about checking her blood sugars.  Due to the patient not being able to see her boyfriend checks her blood sugars.  She is on dialysis and she states that there are days that she will not take her medication before she goes. She understand how this will affect her numbers.    .   COMPLETED: Perform Foot Care   On track     Follow Up Date 10/05/20   - check feet daily for cuts, sores or redness - trim toenails straight across - wear comfortable, cotton socks - wear comfortable, well-fitting shoes    Why is this important?   Good foot care is very important when you have diabetes.  There are many things you can do to keep your feet healthy and catch a problem early.    Notes: Discussed foot care and patient is seen in dialysis and her feet are checked there and by her boyfriend.  She is also seen by  a podiatrist and has an appointment to have her toenails trimmed.       Review of patient status, including review of consultants reports, relevant laboratory and other test results, and collaboration with appropriate care team members and the patient's provider was performed as part of comprehensive patient evaluation and provision of chronic care management services.    SDOH (Social Determinants of Health) assessments performed: No See Care Plan activities for detailed interventions related to SDOH)         Plan: Telephone follow up with [name] over the next 21 days.   Lazaro Arms RN, BSN, [facility] Care Management Coordinator [place] Phone: [phone] Fax: [phone]

## 2020-10-20 ENCOUNTER — Other Ambulatory Visit: Payer: Self-pay | Admitting: Family Medicine

## 2020-10-21 ENCOUNTER — Telehealth: Payer: Self-pay

## 2020-10-21 ENCOUNTER — Telehealth: Payer: Medicare Other

## 2020-10-21 NOTE — Telephone Encounter (Signed)
  Care Management   Outreach Note  10/21/2020 Name: [name] MRN: [id number] DOB: 18-Mar-1966  Referred by: [name], MD Reason for referral : Chronic Care Management (DM II)   An unsuccessful telephone outreach was attempted today. The patient was referred to the case management team for assistance with care management and care coordination.   Follow Up Plan: A HIPAA compliant phone message was left for the patient providing contact information and requesting a return call.  The care management team will reach out to the patient again over the next 7-14 days.   Lazaro Arms RN, BSN, [facility] Care Management Coordinator [place] Phone: [phone] Fax: [phone]

## 2020-10-22 ENCOUNTER — Telehealth: Payer: Self-pay | Admitting: *Deleted

## 2020-10-22 NOTE — Chronic Care Management (AMB) (Signed)
  Care Management   Note  10/22/2020 Name: [name] MRN: [id number] DOB: 1966/05/17  [name] is a 54 y.o. year old female who is a primary care patient of [name], MD and is actively engaged with the care management team. I reached out to [name] by phone today to assist with re-scheduling a follow up visit with the RN Case Manager.  Follow up plan: Unsuccessful telephone outreach attempt made. A HIPAA compliant phone message was left for the patient providing contact information and requesting a return call. The care management team will reach out to the patient again over the next 7 days. If patient returns call to provider office, please advise to call [place] at [phone].  [place] Management

## 2020-10-29 NOTE — Telephone Encounter (Signed)
Called spoke with patient rescheduled for follow up telephone call with Lazaro Arms RN CM on 11/07/2020

## 2020-10-29 NOTE — Chronic Care Management (AMB) (Signed)
  Care Management   Note  10/29/2020 Name: [name] MRN: [id number] DOB: 18-Jul-1966  [name] is a 54 y.o. year old female who is a primary care patient of [name], MD and is actively engaged with the care management team. I reached out to [name] by phone today to assist with re-scheduling a follow up visit with the RN Case Manager  Follow up plan: Telephone appointment with care management team member scheduled for:11/07/2020  [place] Management

## 2020-11-07 ENCOUNTER — Ambulatory Visit: Payer: Medicare Other

## 2020-11-07 NOTE — Chronic Care Management (AMB) (Signed)
RN  Care Management   Follow Up Note   11/07/2020 Name: [name] MRN: [id number] DOB: 05-21-1966  Reason for referral : Chronic Care Management (DM II)   [name] is a 54 y.o. year old female who is a primary care patient of [name], MD. The care management team was consulted for assistance with care management and care coordination needs.      Assessment: Called to follw up with the patiednt about her diabetes she states tht she is doing well.  Patient Care Plan: RN Case Manager  Problem Identified: Glycemic Management (Diabetes, Type 2)   Long-Range Goal: Glycemic Management    Start Date: 06/24/2020  Expected End Date: 02/02/2021  Priority: Medium  Note:   Objective:  Lab Results  Component Value Date   HGBA1C 7.2 (A) 09/23/2020 .   Lab Results  Component Value Date   CREATININE 6.48 (H) 03/20/2020   CREATININE 5.82 (H) 12/26/2018   CREATININE 9.67 (H) 12/25/2018   Current Barriers:  [name] Knowledge Deficits related to basic Diabetes pathophysiology and self care/management  Case Manager Clinical Goal(s):  Over the next 90 days, patient will demonstrate improved adherence to prescribed treatment plan for diabetes self care/management as evidenced by: maintaining her blood sugars in the range on 125-135  Interventions:  . Reviewed medications with patient and discussed importance of medication adherence . Reviewed scheduled/upcoming provider appointments including: patient has an appointment with GI for colonoscopy on 11/11/20.  She is concerned because it sounds like she has a 2 day prep and has dialysis on Monday and doesn't know what that will look like and having to go the bathroom. RNCM advised the patient to call and talk with the office about her concerns. . Review of patient status, including review of consultants reports, relevant laboratory and other test results, and medications completed. [name] activity based on tolerance and functional  limitations encouraged . healthy lifestyle promoted . modest weight loss  promoted . quality of sleep assessed-sleeps well . blood glucose monitoring encouraged . blood glucose readings reviewed patient states that they are ranging 130-135 . mutual A1C goal set or reviewed 7 or below  Patient Goals/Self Care Activities:   Patient verbalizes understanding of plan to have her mate to help administers injections and she self administer pills as prescribed  Calls pharmacy for medication refills  Call's provider office for new concerns or questions  check blood sugar at prescribed times  check blood sugar if I feel it is too high or too low  take the blood sugar meter to all doctor visits  schedule appointment with eye doctor  check feet daily for cuts, sores or redness  trim toenails straight across  wash and dry feet carefully every day  wear comfortable, cotton socks  wear comfortable, well-fitting shoes  Follow- the GI office to express her concerns   Follow up Plan: The care management team will reach out to the patient again over the next 14 days.        Review of patient status, including review of consultants reports, relevant laboratory and other test results, and collaboration with appropriate care team members and the patient's provider was performed as part of comprehensive patient evaluation and provision of chronic care management services.    SDOH (Social Determinants of Health) assessments performed: No See Care Plan activities for detailed interventions related to SDOH)     Lazaro Arms RN, BSN, [place] Management Coordinator [place] Phone:  [phone] Fax: [phone]

## 2020-11-07 NOTE — Patient Instructions (Signed)
Visit Information  Goals Addressed            This Visit's Progress   . Monitor and Manage My Blood Sugar       Patient Goals/Self Care Activities:   Patient verbalizes understanding of plan to have her mate to help administers injections and she self administer pills as prescribed  Calls pharmacy for medication refills  Call's provider office for new concerns or questions  check blood sugar at prescribed times  check blood sugar if I feel it is too high or too low  take the blood sugar meter to all doctor visits  schedule appointment with eye doctor  check feet daily for cuts, sores or redness  trim toenails straight across  wash and dry feet carefully every day  wear comfortable, cotton socks  wear comfortable, well-fitting shoes  Follow- the GI office to express her concerns    Ms. Derk was given information about Care Management services today including:  1. Care Management services include personalized support from designated clinical staff supervised by her physician, including individualized plan of care and coordination with other care providers 2. 24/7 contact phone numbers for assistance for urgent and routine care needs. 3. The patient may stop CCM services at any time (effective at the end of the month) by phone call to the office staff.  Patient agreed to services and verbal consent obtained.   The patient verbalized understanding of instructions, educational materials, and care plan provided today and declined offer to receive copy of patient instructions, educational materials, and care plan.   The care management team will reach out to the patient again over the next 14 days.   Lazaro Arms RN, BSN, [facility] Care Management Coordinator [place] Phone: [phone] Fax: [phone]

## 2020-11-11 ENCOUNTER — Encounter: Payer: Self-pay | Admitting: Internal Medicine

## 2020-11-21 ENCOUNTER — Other Ambulatory Visit: Payer: Self-pay | Admitting: Family Medicine

## 2020-11-21 ENCOUNTER — Other Ambulatory Visit: Payer: Self-pay

## 2020-11-21 DIAGNOSIS — E1139 Type 2 diabetes mellitus with other diabetic ophthalmic complication: Secondary | ICD-10-CM

## 2020-11-21 DIAGNOSIS — Z794 Long term (current) use of insulin: Secondary | ICD-10-CM

## 2020-11-21 MED ORDER — GLUCOSE BLOOD VI STRP: ORAL_STRIP | 12 refills | Status: DC

## 2020-11-21 MED ORDER — ONETOUCH DELICA LANCING DEV MISC: 0 refills | Status: DC

## 2020-11-21 MED ORDER — ONETOUCH ULTRASOFT LANCETS MISC
12 refills | Status: DC
Start: 2020-11-21 — End: 2021-12-11

## 2020-11-21 MED ORDER — BLOOD GLUCOSE METER KIT: PACK | 0 refills | Status: DC

## 2020-11-25 ENCOUNTER — Other Ambulatory Visit: Payer: Self-pay | Admitting: Family Medicine

## 2020-11-25 ENCOUNTER — Telehealth: Payer: Medicare Other

## 2020-11-25 DIAGNOSIS — E1139 Type 2 diabetes mellitus with other diabetic ophthalmic complication: Secondary | ICD-10-CM

## 2020-11-26 ENCOUNTER — Telehealth: Payer: Medicare Other

## 2020-11-26 ENCOUNTER — Telehealth: Payer: Self-pay

## 2020-11-26 ENCOUNTER — Other Ambulatory Visit: Payer: Self-pay | Admitting: Family Medicine

## 2020-11-26 DIAGNOSIS — Z794 Long term (current) use of insulin: Secondary | ICD-10-CM

## 2020-11-26 DIAGNOSIS — E1139 Type 2 diabetes mellitus with other diabetic ophthalmic complication: Secondary | ICD-10-CM

## 2020-11-26 NOTE — Telephone Encounter (Signed)
  Care Management   Outreach Note  11/26/2020 Name: [name] MRN: [id number] DOB: 1966/11/14  Referred by: [name], MD Reason for referral : Chronic Care Management (DM II)   [name] is enrolled in a Managed Medicaid Health Plan: No  An unsuccessful telephone outreach was attempted today. The patient was referred to the case management team for assistance with care management and care coordination.   Follow Up Plan: A HIPAA compliant phone message was left for the patient providing contact information and requesting a return call.  The care management team will reach out to the patient again over the next 7-14 days.   Lazaro Arms RN, BSN, [facility] Care Management Coordinator [place] Phone: [phone] I Fax: [phone]

## 2020-12-01 NOTE — Telephone Encounter (Signed)
Traci called spoke with patient rescheduled for follow up call on 12/15/2020

## 2020-12-02 ENCOUNTER — Other Ambulatory Visit: Payer: Self-pay | Admitting: Family Medicine

## 2020-12-02 DIAGNOSIS — Z794 Long term (current) use of insulin: Secondary | ICD-10-CM

## 2020-12-02 DIAGNOSIS — E1139 Type 2 diabetes mellitus with other diabetic ophthalmic complication: Secondary | ICD-10-CM

## 2020-12-03 ENCOUNTER — Other Ambulatory Visit: Payer: Self-pay | Admitting: Family Medicine

## 2020-12-03 DIAGNOSIS — E1139 Type 2 diabetes mellitus with other diabetic ophthalmic complication: Secondary | ICD-10-CM

## 2020-12-03 DIAGNOSIS — Z794 Long term (current) use of insulin: Secondary | ICD-10-CM

## 2020-12-04 ENCOUNTER — Other Ambulatory Visit: Payer: Self-pay | Admitting: Family Medicine

## 2020-12-04 DIAGNOSIS — E1139 Type 2 diabetes mellitus with other diabetic ophthalmic complication: Secondary | ICD-10-CM

## 2020-12-06 DIAGNOSIS — N186 End stage renal disease: Secondary | ICD-10-CM | POA: Diagnosis not present

## 2020-12-06 DIAGNOSIS — E1122 Type 2 diabetes mellitus with diabetic chronic kidney disease: Secondary | ICD-10-CM | POA: Diagnosis not present

## 2020-12-06 DIAGNOSIS — Z992 Dependence on renal dialysis: Secondary | ICD-10-CM | POA: Diagnosis not present

## 2020-12-08 DIAGNOSIS — D631 Anemia in chronic kidney disease: Secondary | ICD-10-CM | POA: Diagnosis not present

## 2020-12-08 DIAGNOSIS — Z992 Dependence on renal dialysis: Secondary | ICD-10-CM | POA: Diagnosis not present

## 2020-12-08 DIAGNOSIS — E1122 Type 2 diabetes mellitus with diabetic chronic kidney disease: Secondary | ICD-10-CM | POA: Diagnosis not present

## 2020-12-08 DIAGNOSIS — N186 End stage renal disease: Secondary | ICD-10-CM | POA: Diagnosis not present

## 2020-12-08 DIAGNOSIS — N2581 Secondary hyperparathyroidism of renal origin: Secondary | ICD-10-CM | POA: Diagnosis not present

## 2020-12-10 DIAGNOSIS — Z992 Dependence on renal dialysis: Secondary | ICD-10-CM | POA: Diagnosis not present

## 2020-12-10 DIAGNOSIS — E1122 Type 2 diabetes mellitus with diabetic chronic kidney disease: Secondary | ICD-10-CM | POA: Diagnosis not present

## 2020-12-10 DIAGNOSIS — D631 Anemia in chronic kidney disease: Secondary | ICD-10-CM | POA: Diagnosis not present

## 2020-12-10 DIAGNOSIS — N2581 Secondary hyperparathyroidism of renal origin: Secondary | ICD-10-CM | POA: Diagnosis not present

## 2020-12-10 DIAGNOSIS — N186 End stage renal disease: Secondary | ICD-10-CM | POA: Diagnosis not present

## 2020-12-12 DIAGNOSIS — D631 Anemia in chronic kidney disease: Secondary | ICD-10-CM | POA: Diagnosis not present

## 2020-12-12 DIAGNOSIS — N186 End stage renal disease: Secondary | ICD-10-CM | POA: Diagnosis not present

## 2020-12-12 DIAGNOSIS — E1122 Type 2 diabetes mellitus with diabetic chronic kidney disease: Secondary | ICD-10-CM | POA: Diagnosis not present

## 2020-12-12 DIAGNOSIS — N2581 Secondary hyperparathyroidism of renal origin: Secondary | ICD-10-CM | POA: Diagnosis not present

## 2020-12-12 DIAGNOSIS — Z992 Dependence on renal dialysis: Secondary | ICD-10-CM | POA: Diagnosis not present

## 2020-12-15 ENCOUNTER — Ambulatory Visit: Payer: Medicare Other

## 2020-12-15 DIAGNOSIS — N186 End stage renal disease: Secondary | ICD-10-CM | POA: Diagnosis not present

## 2020-12-15 DIAGNOSIS — D631 Anemia in chronic kidney disease: Secondary | ICD-10-CM | POA: Diagnosis not present

## 2020-12-15 DIAGNOSIS — E1122 Type 2 diabetes mellitus with diabetic chronic kidney disease: Secondary | ICD-10-CM | POA: Diagnosis not present

## 2020-12-15 DIAGNOSIS — Z992 Dependence on renal dialysis: Secondary | ICD-10-CM | POA: Diagnosis not present

## 2020-12-15 DIAGNOSIS — N2581 Secondary hyperparathyroidism of renal origin: Secondary | ICD-10-CM | POA: Diagnosis not present

## 2020-12-15 NOTE — Chronic Care Management (AMB) (Signed)
Care Management    RN Visit Note  12/15/2020 Name: [name] MRN: [id number] DOB: September 02, 1966  Subjective: [name] is a 55 y.o. year old female who is a primary care patient of [name] Damme, MD. The Care Management team was consulted for assistance with chronic disease management and care coordination needs.    Engaged with patient by telephone for follow up visit in response to provider referral for pharmacy case management and/or care coordination services.   Consent to Services:   [name] was given information about Care Management services today including:  1. Care Management services includes personalized support from designated clinical staff supervised by her physician, including individualized plan of care and coordination with other care providers 2. 24/7 contact phone numbers for assistance for urgent and routine care needs. 3. The patient may stop case management services at any time by phone call to the office staff.  Patient agreed to services and consent obtained.   Assessment/Interventions: Review of patient past medical history, allergies, medications, health status, including review of consultants reports, laboratory and other test data, was performed as part of comprehensive evaluation and provision of chronic care management services.   SDOH (Social Determinants of Health) assessments and interventions performed:    Care Plan  No Known Allergies  Outpatient Encounter Medications as of 12/15/2020  Medication Sig Note  . amLODipine (NORVASC) 10 MG tablet TAKE 1 TABLET BY MOUTH ONCE DAILY   . atorvastatin (LIPITOR) 40 MG tablet TAKE 1 TABLET BY MOUTH ONCE DAILY   . B Complex-C-Folic Acid (DIALYVITE 326) 0.8 MG TABS Take 1 tablet by mouth daily.  06/24/2020: Unsure if she is taking  . blood glucose meter kit and supplies Dispense based on patient and insurance preference. Use up to four times daily as directed. (FOR ICD-10 E10.9, E11.9).   . ferric  citrate (AURYXIA) 1 GM 210 MG(Fe) tablet  (Patient not taking: Reported on 09/23/2020)   . gabapentin (NEURONTIN) 100 MG capsule TAKE 1 CAPSULE (100 MG TOTAL) BY MOUTH DAILY.   [name] glucose blood test strip Use as instructed, for testing blood 4 times daily.   . insulin glargine (LANTUS) 100 UNIT/ML Solostar Pen Inject 12 Units into the skin 2 (two) times daily.   . Insulin Isophane & Regular Human (HUMULIN 70/30 KWIKPEN) (70-30) 100 UNIT/ML PEN Inject 12 Units into the skin 2 (two) times daily. (Patient not taking: Reported on 09/23/2020)   . Insulin Pen Needle (EASY TOUCH PEN NEEDLES) 32G X 4 MM MISC 1 each by Does not apply route 3 (three) times daily.   Elmore Guise Devices (ONETOUCH DELICA PLUS LANCING) MISC CHECK BLOOD SUGARS FOURX DAILY   . Lancets (ONETOUCH ULTRASOFT) lancets Use as instructed   . losartan (COZAAR) 50 MG tablet Take 1 tablet (50 mg total) by mouth daily.   . mupirocin cream (BACTROBAN) 2 % Apply 1 application topically 2 (two) times daily. (Patient not taking: Reported on 09/23/2020)   . pantoprazole (PROTONIX) 20 MG tablet Take 1 tablet (20 mg total) by mouth daily.   . promethazine (PHENERGAN) 12.5 MG tablet Take 1 tablet (12.5 mg total) by mouth every 6 (six) hours as needed for up to 10 doses for nausea or vomiting. (Patient not taking: Reported on 09/23/2020)   . senna-docusate (SENOKOT-S) 8.6-50 MG tablet Take 1 tablet by mouth daily.   . Sodium Chloride Flush (SALINE FLUSH) 0.9 % SOLN Inject 5 mLs into the vein 3 (three) times daily. (Patient not taking: Reported  on 09/23/2020)    No facility-administered encounter medications on file as of 12/15/2020.    Patient Active Problem List   Diagnosis Date Noted  . Constipation 09/23/2020  . Dizziness 03/25/2020  . Diarrhea, unspecified 12/27/2018  . ESRD on dialysis ([place])   . Chronic cough 10/26/2017  . Diabetic peripheral neuropathy ([place]) 03/17/2017  . Hyperlipidemia 03/17/2017  . S/P BKA (below knee amputation)  unilateral, right ([place]) 01/04/2017  . History of alcohol abuse 01/04/2017  . Legally blind 01/04/2017  . Encounter for screening for respiratory tuberculosis 05/05/2016  . Unspecified protein-calorie malnutrition ([place]) 05/05/2016  . Anemia in chronic kidney disease 05/05/2016  . Secondary hyperparathyroidism of renal origin ([place]) 05/05/2016  . Hypokalemia 08/29/2012  . Type 2 diabetes mellitus ([place]) 09/01/2011  . Secondary hypertension, unspecified 09/01/2011  . Anemia     Conditions to be addressed/monitored: DMII  Patient Care Plan: RN Case Manager  Problem Identified: Glycemic Management (Diabetes, Type 2)   Long-Range Goal: Glycemic Management   Start Date: 06/24/2020  Expected End Date: 02/02/2021  This Visit's Progress: On track  Priority: Medium  Note:   Objective:  Lab Results  Component Value Date   HGBA1C 7.2 (A) 09/23/2020 .   Lab Results  Component Value Date   CREATININE 6.48 (H) 03/20/2020   CREATININE 5.82 (H) 12/26/2018   CREATININE 9.67 (H) 12/25/2018   Current Barriers:  [name] Knowledge Deficits related to basic Diabetes pathophysiology and self care/management  Case Manager Clinical Goal(s):  Over the next 90 days, patient will demonstrate improved adherence to prescribed treatment plan for diabetes self care/management as evidenced by: maintaining her blood sugars in the range on 125-135  Interventions:  . Reviewed medications with patient and discussed importance of medication adherence . Reviewed scheduled/upcoming provider appointments including: patient has an appointment with GI for colonoscopy on 01/13/21.  She is concerned because it sounds like she has a 2 day prep and has dialysis on Monday and doesn't know what that will look like and having to go the bathroom. RNCM advised the patient to call and talk with the office about her concerns. . Review of patient status, including review of consultants reports, relevant laboratory and other test results, and  medications completed. [name] activity based on tolerance and functional limitations encouraged . healthy lifestyle promoted . modest weight loss  promoted . quality of sleep assessed-sleeps well . blood glucose monitoring encouraged . blood glucose readings reviewed patient states that they are ranging 140-145 . mutual A1C goal set or reviewed 7 or below  Patient Goals/Self Care Activities:   Patient verbalizes understanding of plan to have her mate to help administers injections and she self administer pills as prescribed  Calls pharmacy for medication refills  Call's provider office for new concerns or questions  check blood sugar at prescribed times  check blood sugar if I feel it is too high or too low  take the blood sugar meter to all doctor visits  Follow- the GI office to express her concerns   Follow up Plan: The care management team will reach out to the patient again over the next 14 days.        Lazaro Arms RN, BSN, [facility] Care Management Coordinator [place] Phone: [phone] I Fax: [phone]

## 2020-12-15 NOTE — Patient Instructions (Signed)
[name]  it was nice speaking with you. Please call me directly [phone] if you have questions about the goals we discussed.  Goals Addressed            This Visit's Progress   . Monitor and Manage My Blood Sugar       Patient Goals/Self Care Activities:   Patient verbalizes understanding of plan to have her mate to help administers injections and she self administer pills as prescribed  Calls pharmacy for medication refills  Call's provider office for new concerns or questions  check blood sugar at prescribed times  check blood sugar if I feel it is too high or too low  tFollow- the GI office to express her concerns       Patient Care Plan: RN Case Manager  Problem Identified: Glycemic Management (Diabetes, Type 2)   Long-Range Goal: Glycemic Management   Start Date: 06/24/2020  Expected End Date: 02/02/2021  This Visit's Progress: On track  Priority: Medium  Note:   Objective:  Lab Results  Component Value Date   HGBA1C 7.2 (A) 09/23/2020 .   Lab Results  Component Value Date   CREATININE 6.48 (H) 03/20/2020   CREATININE 5.82 (H) 12/26/2018   CREATININE 9.67 (H) 12/25/2018   Current Barriers:  [name] Knowledge Deficits related to basic Diabetes pathophysiology and self care/management  Case Manager Clinical Goal(s):  Over the next 90 days, patient will demonstrate improved adherence to prescribed treatment plan for diabetes self care/management as evidenced by: maintaining her blood sugars in the range on 125-135  Interventions:  . Reviewed medications with patient and discussed importance of medication adherence . Reviewed scheduled/upcoming provider appointments including: patient has an appointment with GI for colonoscopy on 01/13/21.  She is concerned because it sounds like she has a 2 day prep and has dialysis on Monday and doesn't know what that will look like and having to go the bathroom. RNCM advised the patient to call and talk with the office about  her concerns. . Review of patient status, including review of consultants reports, relevant laboratory and other test results, and medications completed. [name] activity based on tolerance and functional limitations encouraged . healthy lifestyle promoted . modest weight loss  promoted . quality of sleep assessed-sleeps well . blood glucose monitoring encouraged . blood glucose readings reviewed patient states that they are ranging 140-145 . mutual A1C goal set or reviewed 7 or below  Patient Goals/Self Care Activities:   Patient verbalizes understanding of plan to have her mate to help administers injections and she self administer pills as prescribed  Calls pharmacy for medication refills  Call's provider office for new concerns or questions  check blood sugar at prescribed times  check blood sugar if I feel it is too high or too low  take the blood sugar meter to all doctor visits  Follow- the GI office to express her concerns   Follow up Plan: The care management team will reach out to the patient again over the next 14 days.        Ms. Emami received Care Management services today:  1. Care Management services include personalized support from designated clinical staff supervised by her physician, including individualized plan of care and coordination with other care providers 2. 24/7 contact [phone] for assistance for urgent and routine care needs. 3. Care Management are voluntary services and be declined at any time by calling the office.  The patient verbalized understanding of instructions provided today and declined  a print copy of patient instruction materials.     Lazaro Arms, RN

## 2020-12-17 DIAGNOSIS — D631 Anemia in chronic kidney disease: Secondary | ICD-10-CM | POA: Diagnosis not present

## 2020-12-17 DIAGNOSIS — N186 End stage renal disease: Secondary | ICD-10-CM | POA: Diagnosis not present

## 2020-12-17 DIAGNOSIS — Z992 Dependence on renal dialysis: Secondary | ICD-10-CM | POA: Diagnosis not present

## 2020-12-17 DIAGNOSIS — N2581 Secondary hyperparathyroidism of renal origin: Secondary | ICD-10-CM | POA: Diagnosis not present

## 2020-12-17 DIAGNOSIS — E1122 Type 2 diabetes mellitus with diabetic chronic kidney disease: Secondary | ICD-10-CM | POA: Diagnosis not present

## 2020-12-19 DIAGNOSIS — N186 End stage renal disease: Secondary | ICD-10-CM | POA: Diagnosis not present

## 2020-12-19 DIAGNOSIS — E1122 Type 2 diabetes mellitus with diabetic chronic kidney disease: Secondary | ICD-10-CM | POA: Diagnosis not present

## 2020-12-19 DIAGNOSIS — D631 Anemia in chronic kidney disease: Secondary | ICD-10-CM | POA: Diagnosis not present

## 2020-12-19 DIAGNOSIS — N2581 Secondary hyperparathyroidism of renal origin: Secondary | ICD-10-CM | POA: Diagnosis not present

## 2020-12-19 DIAGNOSIS — Z992 Dependence on renal dialysis: Secondary | ICD-10-CM | POA: Diagnosis not present

## 2020-12-23 DIAGNOSIS — D631 Anemia in chronic kidney disease: Secondary | ICD-10-CM | POA: Diagnosis not present

## 2020-12-23 DIAGNOSIS — E1122 Type 2 diabetes mellitus with diabetic chronic kidney disease: Secondary | ICD-10-CM | POA: Diagnosis not present

## 2020-12-23 DIAGNOSIS — Z992 Dependence on renal dialysis: Secondary | ICD-10-CM | POA: Diagnosis not present

## 2020-12-23 DIAGNOSIS — N2581 Secondary hyperparathyroidism of renal origin: Secondary | ICD-10-CM | POA: Diagnosis not present

## 2020-12-23 DIAGNOSIS — N186 End stage renal disease: Secondary | ICD-10-CM | POA: Diagnosis not present

## 2020-12-24 DIAGNOSIS — Z992 Dependence on renal dialysis: Secondary | ICD-10-CM | POA: Diagnosis not present

## 2020-12-24 DIAGNOSIS — N186 End stage renal disease: Secondary | ICD-10-CM | POA: Diagnosis not present

## 2020-12-24 DIAGNOSIS — N2581 Secondary hyperparathyroidism of renal origin: Secondary | ICD-10-CM | POA: Diagnosis not present

## 2020-12-24 DIAGNOSIS — D631 Anemia in chronic kidney disease: Secondary | ICD-10-CM | POA: Diagnosis not present

## 2020-12-24 DIAGNOSIS — E1122 Type 2 diabetes mellitus with diabetic chronic kidney disease: Secondary | ICD-10-CM | POA: Diagnosis not present

## 2020-12-26 DIAGNOSIS — N186 End stage renal disease: Secondary | ICD-10-CM | POA: Diagnosis not present

## 2020-12-26 DIAGNOSIS — Z992 Dependence on renal dialysis: Secondary | ICD-10-CM | POA: Diagnosis not present

## 2020-12-26 DIAGNOSIS — D631 Anemia in chronic kidney disease: Secondary | ICD-10-CM | POA: Diagnosis not present

## 2020-12-26 DIAGNOSIS — N2581 Secondary hyperparathyroidism of renal origin: Secondary | ICD-10-CM | POA: Diagnosis not present

## 2020-12-26 DIAGNOSIS — E1122 Type 2 diabetes mellitus with diabetic chronic kidney disease: Secondary | ICD-10-CM | POA: Diagnosis not present

## 2020-12-29 DIAGNOSIS — D631 Anemia in chronic kidney disease: Secondary | ICD-10-CM | POA: Diagnosis not present

## 2020-12-29 DIAGNOSIS — Z992 Dependence on renal dialysis: Secondary | ICD-10-CM | POA: Diagnosis not present

## 2020-12-29 DIAGNOSIS — N2581 Secondary hyperparathyroidism of renal origin: Secondary | ICD-10-CM | POA: Diagnosis not present

## 2020-12-29 DIAGNOSIS — E1122 Type 2 diabetes mellitus with diabetic chronic kidney disease: Secondary | ICD-10-CM | POA: Diagnosis not present

## 2020-12-29 DIAGNOSIS — N186 End stage renal disease: Secondary | ICD-10-CM | POA: Diagnosis not present

## 2020-12-31 DIAGNOSIS — N2581 Secondary hyperparathyroidism of renal origin: Secondary | ICD-10-CM | POA: Diagnosis not present

## 2020-12-31 DIAGNOSIS — E1122 Type 2 diabetes mellitus with diabetic chronic kidney disease: Secondary | ICD-10-CM | POA: Diagnosis not present

## 2020-12-31 DIAGNOSIS — N186 End stage renal disease: Secondary | ICD-10-CM | POA: Diagnosis not present

## 2020-12-31 DIAGNOSIS — Z992 Dependence on renal dialysis: Secondary | ICD-10-CM | POA: Diagnosis not present

## 2020-12-31 DIAGNOSIS — D631 Anemia in chronic kidney disease: Secondary | ICD-10-CM | POA: Diagnosis not present

## 2020-12-31 IMAGING — CT CT IMAGE GUIDED DRAINAGE BY PERCUTANEOUS CATHETER
1 of 3 series · 13 of 32 positions shown, 18 images · non-contrast
Comparison: none

INDICATION: 52-year-old female with a history of gangrenous acute appendicitis
status post appendectomy. Recent CT imaging demonstrates and
intrahepatic abscess with a small stone adjacent to the liver. The
stone was not present on prior imaging and almost certainly
represents a retained appendicolith which likely represents the
source of the patient's hepatic abscess. She presents for CT-guided
drainage of the abscess cavity.

[Series 2: i-spiral 5.0 b40f · axial · 0.90mm/px · z∈[+891,+1167]mm · 13 of 91 slices shown, 18 images]
[im 6/91  soft-tissue]
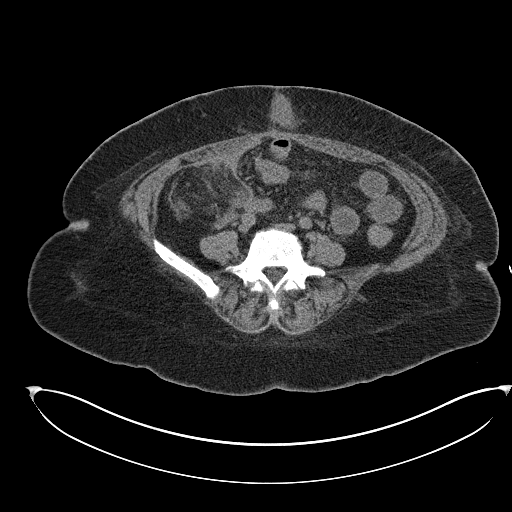
[im 6/91  bone]
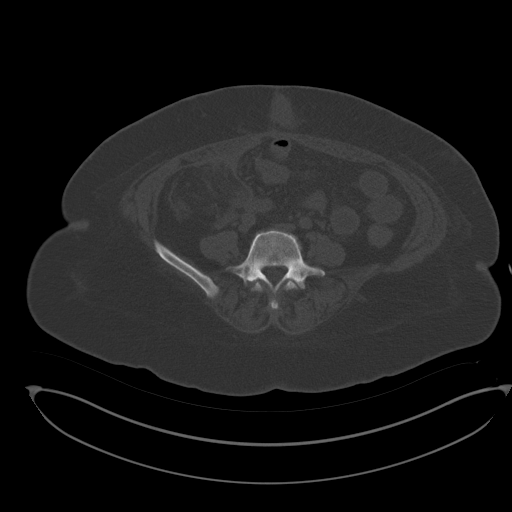
[im 12/91  soft-tissue]
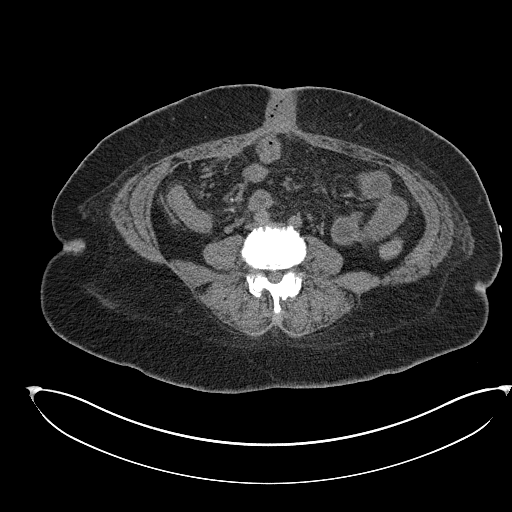
[im 23/91  soft-tissue]
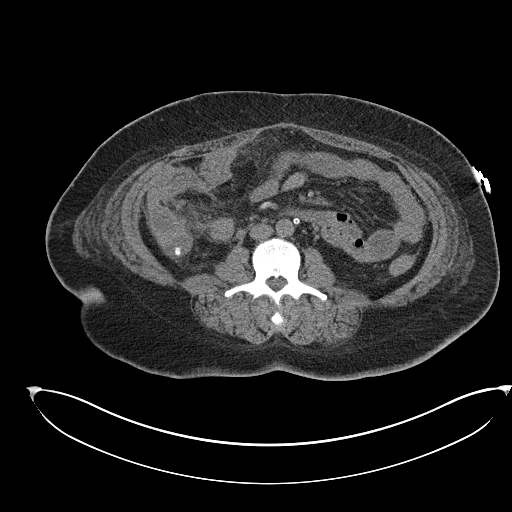
[im 29/91  soft-tissue]
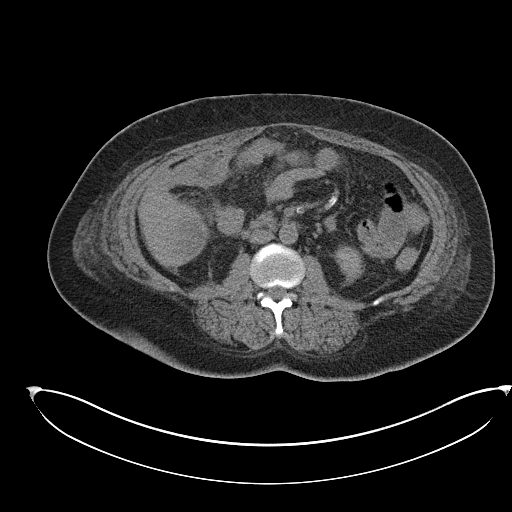
[im 34/91  soft-tissue]
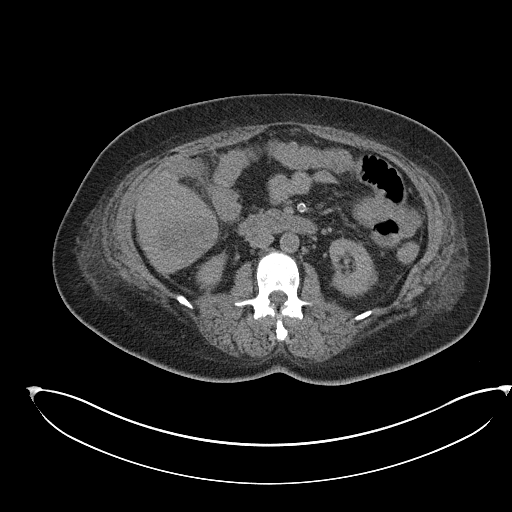
[im 40/91  soft-tissue]
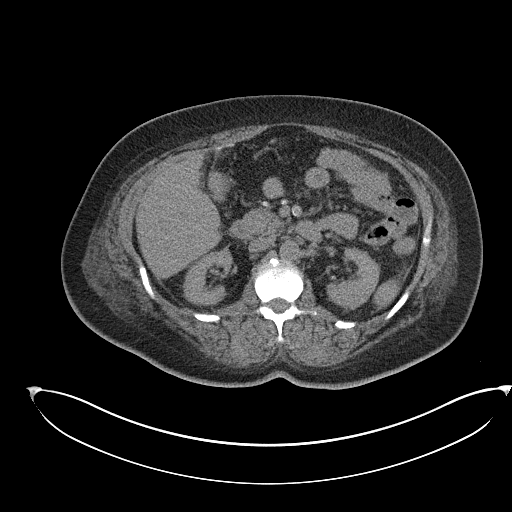
[im 51/91  soft-tissue]
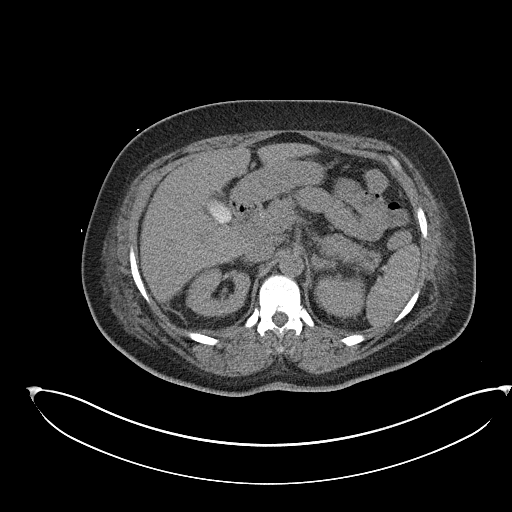
[im 57/91  soft-tissue]
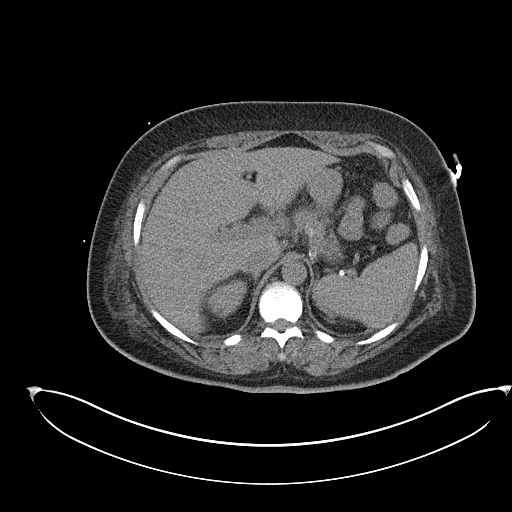
[im 62/91  soft-tissue]
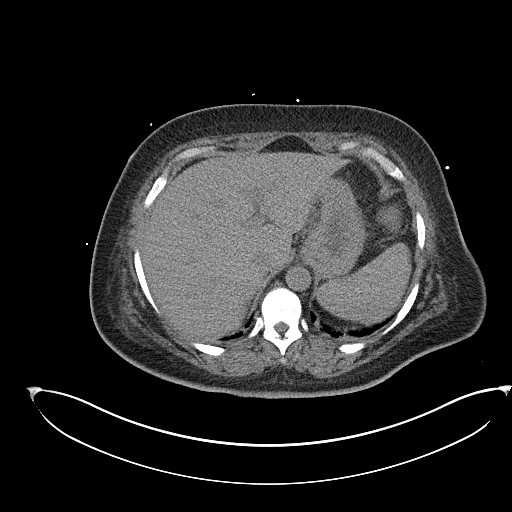
[im 62/91  bone]
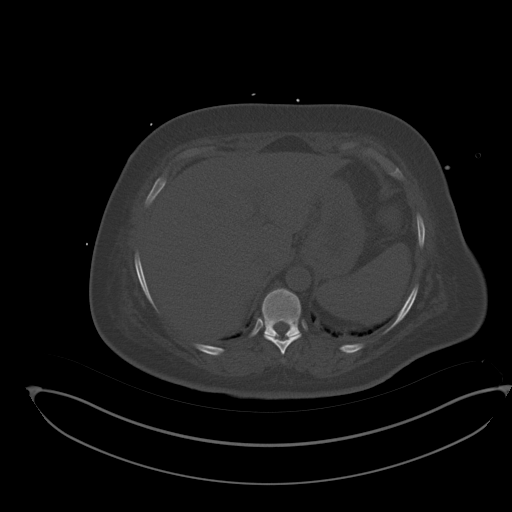
[im 68/91  soft-tissue]
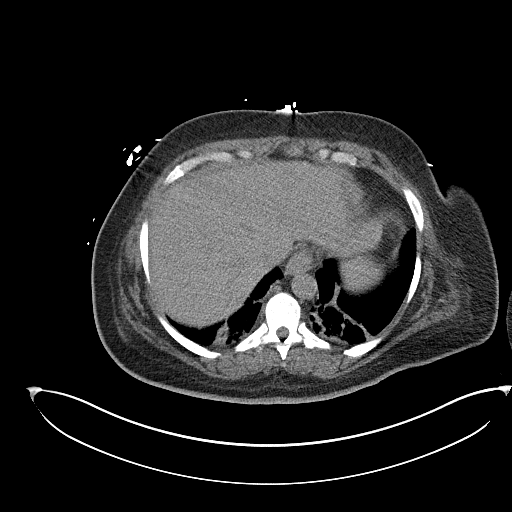
[im 68/91  lung]
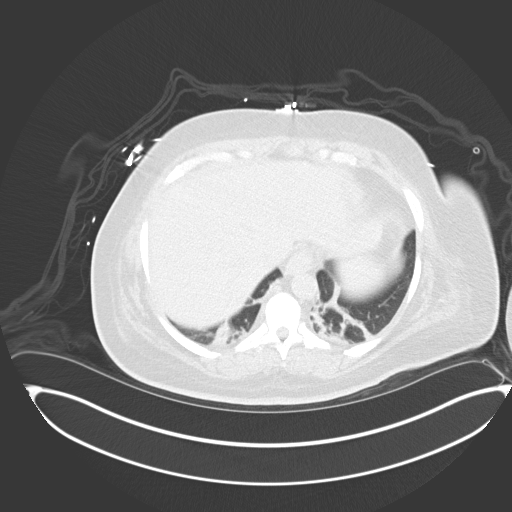
[im 74/91  lung]
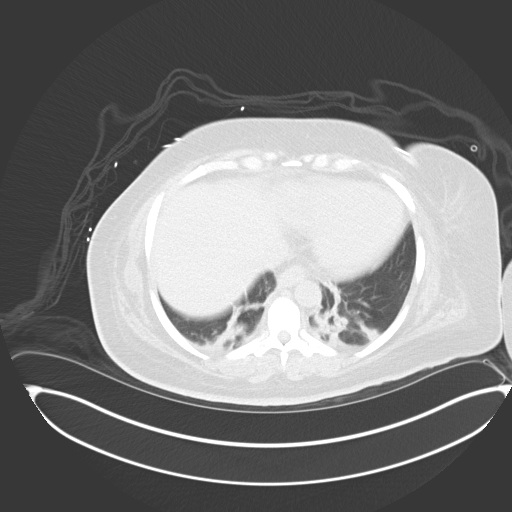
[im 79/91  soft-tissue]
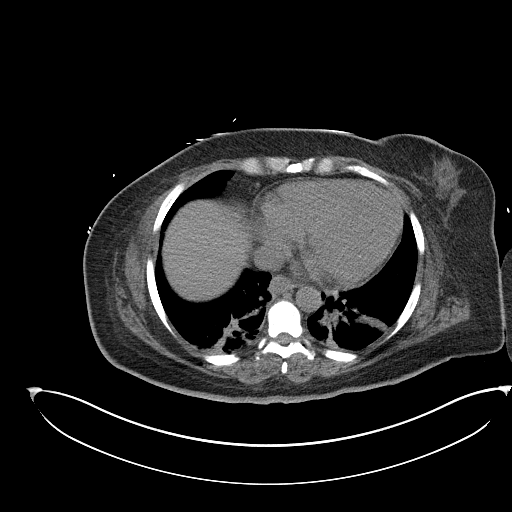
[im 79/91  lung]
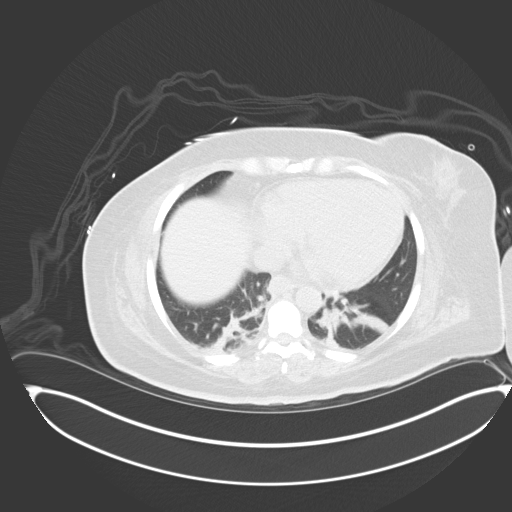
[im 85/91  soft-tissue]
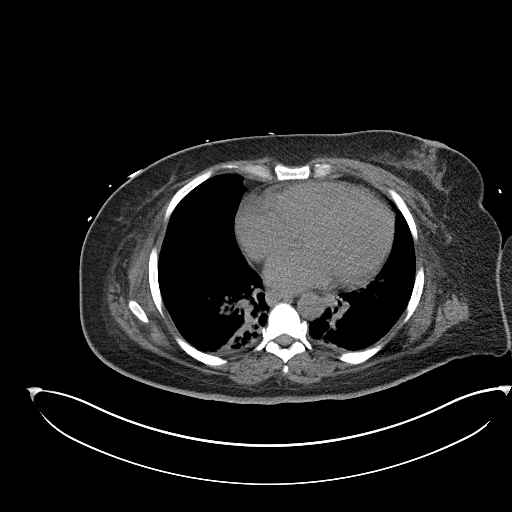
[im 85/91  lung]
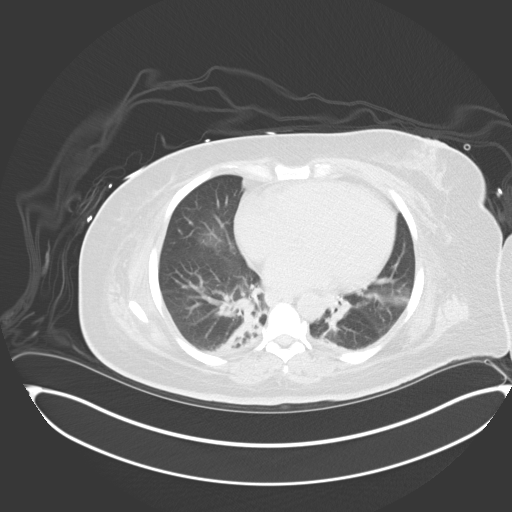

[13 of 32 positions shown; findings below may reference images not displayed]

EXAM:
CT-guided drain placement

MEDICATIONS:
The patient is currently admitted to the hospital and receiving
intravenous antibiotics. The antibiotics were administered within an
appropriate time frame prior to the initiation of the procedure.
Additionally, 4 mg Zofran were administered intravenously for
nausea.

ANESTHESIA/SEDATION:
Fentanyl 1 mcg IV; Versed 50 mg IV.

Moderate Sedation Time:  21 minutes

The patient was continuously monitored during the procedure by the
interventional radiology nurse under my direct supervision.

COMPLICATIONS:
None immediate.

PROCEDURE:
Informed written consent was obtained from the patient after a
thorough discussion of the procedural risks, benefits and
alternatives. All questions were addressed. Maximal Sterile Barrier
Technique was utilized including caps, mask, sterile gowns, sterile
gloves, sterile drape, hand hygiene and skin antiseptic. A timeout
was performed prior to the initiation of the procedure.

A planning CT scan was performed. The low-attenuation collection in
the inferior aspect of hepatic segment 6 is easily visualized. The
small appendicoliths is again visualized.

A suitable skin entry site was selected and marked. After the skin
was sterilely prepped and draped in the standard fashion using
chlorhexidine skin prep, local anesthesia was attained by
infiltration with 1% lidocaine. A small dermatotomy was made. Under
intermittent CT guidance, an 18 gauge trocar needle was advanced
through the liver and into the fluid collection. A 0.035 wire was
then coiled in the complex collection. The needle was removed. The
skin and transhepatic tract were dilated to 10 French and Mamadou Lapierre
10.2 French all-purpose drainage catheter was advanced over the wire
and formed. Aspiration yields approximately 20 cc of foul-smelling
thick, purulent fluid. The aspirated fluid was sent for Gram stain
and culture.

Follow-up CT imaging demonstrates excellent position of the drainage
catheter. The catheter was flushed, connected to JP bulb suction and
secured to the skin with 0 Prolene suture. The patient tolerated the
procedure well.
IMPRESSION: Successful placement of a 10 French transhepatic drainage catheter
into the inferior right hepatic abscess. Aspiration yields 20 mL of
thick, foul-smelling purulent fluid. Samples were sent for culture.

PLAN:
1. Maintain drain to JP bulb suction.
2. Flush drain at least once per shift.
3. Follow cultures and adjust antibiotics accordingly.
4. Surgery aware of the presence of the retained appendicoliths.
This will represent a continued nidus for infection/abscess
formation until removed.

## 2021-01-02 DIAGNOSIS — N186 End stage renal disease: Secondary | ICD-10-CM | POA: Diagnosis not present

## 2021-01-02 DIAGNOSIS — N2581 Secondary hyperparathyroidism of renal origin: Secondary | ICD-10-CM | POA: Diagnosis not present

## 2021-01-02 DIAGNOSIS — D631 Anemia in chronic kidney disease: Secondary | ICD-10-CM | POA: Diagnosis not present

## 2021-01-02 DIAGNOSIS — Z992 Dependence on renal dialysis: Secondary | ICD-10-CM | POA: Diagnosis not present

## 2021-01-02 DIAGNOSIS — E1122 Type 2 diabetes mellitus with diabetic chronic kidney disease: Secondary | ICD-10-CM | POA: Diagnosis not present

## 2021-01-05 DIAGNOSIS — E1122 Type 2 diabetes mellitus with diabetic chronic kidney disease: Secondary | ICD-10-CM | POA: Diagnosis not present

## 2021-01-05 DIAGNOSIS — Z992 Dependence on renal dialysis: Secondary | ICD-10-CM | POA: Diagnosis not present

## 2021-01-05 DIAGNOSIS — N2581 Secondary hyperparathyroidism of renal origin: Secondary | ICD-10-CM | POA: Diagnosis not present

## 2021-01-05 DIAGNOSIS — D631 Anemia in chronic kidney disease: Secondary | ICD-10-CM | POA: Diagnosis not present

## 2021-01-05 DIAGNOSIS — N186 End stage renal disease: Secondary | ICD-10-CM | POA: Diagnosis not present

## 2021-01-06 ENCOUNTER — Telehealth: Payer: Self-pay

## 2021-01-06 ENCOUNTER — Telehealth: Payer: Medicare Other

## 2021-01-06 DIAGNOSIS — E1122 Type 2 diabetes mellitus with diabetic chronic kidney disease: Secondary | ICD-10-CM | POA: Diagnosis not present

## 2021-01-06 DIAGNOSIS — Z992 Dependence on renal dialysis: Secondary | ICD-10-CM | POA: Diagnosis not present

## 2021-01-06 DIAGNOSIS — N186 End stage renal disease: Secondary | ICD-10-CM | POA: Diagnosis not present

## 2021-01-06 NOTE — Telephone Encounter (Signed)
  Care Management   Outreach Note  01/06/2021 Name: [name] MRN: [id number] DOB: 01-08-1966  Referred by: [name], MD Reason for referral : No chief complaint on file.   An unsuccessful telephone outreach was attempted today. The patient was referred to the case management team for assistance with care management and care coordination.   Follow Up Plan: A HIPAA compliant phone message was left for the patient providing contact information and requesting a return call.  The care management team will reach out to the patient again over the next 7-14 days.   Lazaro Arms RN, BSN, [facility] Care Management Coordinator [place] Phone: [phone] I Fax: [phone]

## 2021-01-07 ENCOUNTER — Telehealth: Payer: Self-pay | Admitting: *Deleted

## 2021-01-07 DIAGNOSIS — N2581 Secondary hyperparathyroidism of renal origin: Secondary | ICD-10-CM | POA: Diagnosis not present

## 2021-01-07 DIAGNOSIS — N186 End stage renal disease: Secondary | ICD-10-CM | POA: Diagnosis not present

## 2021-01-07 DIAGNOSIS — D631 Anemia in chronic kidney disease: Secondary | ICD-10-CM | POA: Diagnosis not present

## 2021-01-07 DIAGNOSIS — Z992 Dependence on renal dialysis: Secondary | ICD-10-CM | POA: Diagnosis not present

## 2021-01-07 DIAGNOSIS — E1122 Type 2 diabetes mellitus with diabetic chronic kidney disease: Secondary | ICD-10-CM | POA: Diagnosis not present

## 2021-01-07 NOTE — Chronic Care Management (AMB) (Signed)
  Care Management   Note  01/07/2021 Name: [name] MRN: [id number] DOB: 1966/07/06  [name] is a 55 y.o. year old female who is a primary care patient of [name], MD and is actively engaged with the care management team. I reached out to [name] by phone today to assist with re-scheduling a follow up visit with the RN Case Manager  Follow up plan: Unsuccessful telephone outreach attempt made. A HIPAA compliant phone message was left for the patient providing contact information and requesting a return call. The care management team will reach out to the patient again over the next 7 days. If patient returns call to provider office, please advise to call [place] at [phone].  [place] Management

## 2021-01-08 DIAGNOSIS — E113591 Type 2 diabetes mellitus with proliferative diabetic retinopathy without macular edema, right eye: Secondary | ICD-10-CM | POA: Diagnosis not present

## 2021-01-08 DIAGNOSIS — H26491 Other secondary cataract, right eye: Secondary | ICD-10-CM | POA: Diagnosis not present

## 2021-01-08 DIAGNOSIS — H4052X3 Glaucoma secondary to other eye disorders, left eye, severe stage: Secondary | ICD-10-CM | POA: Diagnosis not present

## 2021-01-08 DIAGNOSIS — H40051 Ocular hypertension, right eye: Secondary | ICD-10-CM | POA: Diagnosis not present

## 2021-01-08 DIAGNOSIS — H4051X2 Glaucoma secondary to other eye disorders, right eye, moderate stage: Secondary | ICD-10-CM | POA: Diagnosis not present

## 2021-01-09 DIAGNOSIS — D631 Anemia in chronic kidney disease: Secondary | ICD-10-CM | POA: Diagnosis not present

## 2021-01-09 DIAGNOSIS — Z992 Dependence on renal dialysis: Secondary | ICD-10-CM | POA: Diagnosis not present

## 2021-01-09 DIAGNOSIS — E1122 Type 2 diabetes mellitus with diabetic chronic kidney disease: Secondary | ICD-10-CM | POA: Diagnosis not present

## 2021-01-09 DIAGNOSIS — N2581 Secondary hyperparathyroidism of renal origin: Secondary | ICD-10-CM | POA: Diagnosis not present

## 2021-01-09 DIAGNOSIS — N186 End stage renal disease: Secondary | ICD-10-CM | POA: Diagnosis not present

## 2021-01-12 DIAGNOSIS — N2581 Secondary hyperparathyroidism of renal origin: Secondary | ICD-10-CM | POA: Diagnosis not present

## 2021-01-12 DIAGNOSIS — N186 End stage renal disease: Secondary | ICD-10-CM | POA: Diagnosis not present

## 2021-01-12 DIAGNOSIS — Z992 Dependence on renal dialysis: Secondary | ICD-10-CM | POA: Diagnosis not present

## 2021-01-12 DIAGNOSIS — D631 Anemia in chronic kidney disease: Secondary | ICD-10-CM | POA: Diagnosis not present

## 2021-01-12 DIAGNOSIS — E1122 Type 2 diabetes mellitus with diabetic chronic kidney disease: Secondary | ICD-10-CM | POA: Diagnosis not present

## 2021-01-13 ENCOUNTER — Other Ambulatory Visit: Payer: Self-pay

## 2021-01-13 ENCOUNTER — Encounter: Payer: Self-pay | Admitting: Family Medicine

## 2021-01-13 ENCOUNTER — Ambulatory Visit (INDEPENDENT_AMBULATORY_CARE_PROVIDER_SITE_OTHER): Payer: Medicare Other | Admitting: Family Medicine

## 2021-01-13 VITALS — BP 158/70 | HR 91 | Ht 67.0 in | Wt 211.2 lb

## 2021-01-13 DIAGNOSIS — Z1159 Encounter for screening for other viral diseases: Secondary | ICD-10-CM

## 2021-01-13 DIAGNOSIS — Z23 Encounter for immunization: Secondary | ICD-10-CM

## 2021-01-13 DIAGNOSIS — Z0000 Encounter for general adult medical examination without abnormal findings: Secondary | ICD-10-CM

## 2021-01-13 DIAGNOSIS — K5900 Constipation, unspecified: Secondary | ICD-10-CM | POA: Diagnosis not present

## 2021-01-13 DIAGNOSIS — E1139 Type 2 diabetes mellitus with other diabetic ophthalmic complication: Secondary | ICD-10-CM | POA: Diagnosis not present

## 2021-01-13 DIAGNOSIS — Z794 Long term (current) use of insulin: Secondary | ICD-10-CM | POA: Diagnosis not present

## 2021-01-13 DIAGNOSIS — E782 Mixed hyperlipidemia: Secondary | ICD-10-CM | POA: Diagnosis not present

## 2021-01-13 DIAGNOSIS — I159 Secondary hypertension, unspecified: Secondary | ICD-10-CM | POA: Diagnosis not present

## 2021-01-13 LAB — POCT GLYCOSYLATED HEMOGLOBIN (HGB A1C): Hemoglobin A1C: 6.9 % — AB (ref 4.0–5.6)

## 2021-01-13 NOTE — Assessment & Plan Note (Signed)
Patient with elevated BP in clinic today to 150s/70s, appears she has frequently elevated BP in clinic, reports as high as 170s during HD. She is currently on amlodipine 10 mg, with ESRD on HD, nephrologist d/c'd losartan last year. Recommend getting a BP measurement at the same time every day in the evenings x 1 week vs amb BP clinic with pharmacy. Will reach out to CCM to see if we can provide patient with BP cuff. Need more data for optimal medication changes.

## 2021-01-13 NOTE — Patient Instructions (Addendum)
It was a pleasure to see you today!  1. We will get some labs today.  If they are abnormal or we need to do something about them, I will call you.  If they are normal, I will send you a message on MyChart (if it is active) or a letter in the mail.  If you don't hear from [place] in 2 weeks, please call the office  [phone].  2. You got the [place] booster today. You may have some arm pain, fatigue, maybe a low grade fever for the next 1-3 days. I recommend taking tylenol, staying hydrated, and resting. If you have any other signs or symptoms or are worried, please call our office and we can assist you [phone].  3. Please call [facility] at Phone: [phone] to schedule colonoscopy. Tell them you have dialysis M-W-F, so it is best to get you scheduled first thing on a Monday morning for procedure.  4. Please take your blood pressure every night for a week when you are calm and resting. Keep both feet on the ground. Write down both the top and bottom number and call me with your results after 1 week.  5. Constipation: I recommend taking 1 senna pill every day after dialysis. You can also try adding in miralax, start with 1 scoop to 4 oz of water. If you do not have 1 bowel movement per day with that, you can increase to 2 scoops. I also recommend increasing your fiber intake with complex grains like oatmeal or chia seeds. Look for recipes for overnight oats.  6. Follow up in 3 months for your next appointment and we will do a pap smear at that time.   Be Well,  Dr. Chauncey Reading

## 2021-01-13 NOTE — Progress Notes (Signed)
SUBJECTIVE:   CHIEF COMPLAINT / HPI: f/u DM  DM: BG 140-160s, highest 200s about a week ago. [name], she recently had an event of BG in 70s because she took insulin and ate only 2 boiled eggs and 2 pieces of bacon. She drank juice and felt better. Counseled on diet. Will get fructosamine level.  HTN: Reports high BP at dialysis to 170s, revew of office BP shows mixed picture of SBPs 140-160 most often in 150s. Currently she is only on amlodipine 10 mg daily. Previously on losartan, but reports her nephrologist d/c'd this medication. Recommend this patient y  Healthcare Maintenance: COVID booster today, will schedule colonoscopy and pap smear.  Muscle Cramps: Patient reports that she has occasional tight muscle spasms in both legs (not at the same time, but interchangeably) that hurt at night. These happen intermittently, especially on HD days. Has not tried anything to treat this.  Constipation: patient reports that she is constipated most of the time and strains. She has gotten to the point where she has had nausea and vomiting with diarrhea. She uses senna one pill every other day due to HD.  PERTINENT  PMH / PSH: DMT2, ESRD on HD, anemia of CKD  OBJECTIVE:   BP (!) 158/70   Pulse 91   Ht '5\' 7"'$  (1.702 m)   Wt 211 lb 3.2 oz (95.8 kg)   LMP 03/07/2019   SpO2 96%   BMI 33.08 kg/m   Nursing note and vitals reviewed GEN: appears older than age, AAW, resting comfortably in chair, NAD, obese HEENT: NCAT. Sclera without injection or icterus. Cardiac: Regular rate and rhythm. Normal S1/S2. No murmurs, rubs, or gallops appreciated. 2+ radial pulses. Lungs: Clear bilaterally to ascultation. No increased WOB, no accessory muscle usage. No w/r/r. Abdomen: Normoactive bowel sounds. No tenderness to deep or light palpation. No rebound or guarding.  Neuro: Alert and at baseline Ext: no edema Psych: Pleasant and appropriate  Diabetic Foot Exam - Simple    Simple Foot Form Diabetic Foot exam was performed with the following findings: Yes 01/13/2021 11:15 AM  Visual Inspection No deformities, no ulcerations, no other skin breakdown bilaterally: Yes Sensation Testing See comments: Yes Pulse Check Posterior Tibialis and Dorsalis pulse intact bilaterally: Yes Comments Patient reports mild numbness in heel, ball of foot, sensation most intact at toes. Overall: skin and toes are in good shape with good vascular flow, but peripheral neuropathy present in setting of long DMT2 dx.    ASSESSMENT/PLAN:   Hyperlipidemia Will obtain lipid panel and CMP today. Presently on atorvastatin 40 mg daily.  Type 2 diabetes mellitus ([place]) Patient on HD with low A1c, likely due to HD status. Will obtain fructosamine for better evaluation of diabetic control. Per report, patient has labile BG that can be in 200s, or to 70s when taking insulin and not eating carbs. Counseled that it is ok to count carbs and have some with meals to prevent lows. Currently on Lantus 12U. Followed by CCM, asked her to check fasting BG every day, but especially to keep log for the next week and I will follow up to ensure appropriate fasting numbers. Patient reports that she has seen a dietician in the past and would not like a referral at this time.  Secondary hypertension, unspecified Patient with elevated BP in clinic today to 150s/70s, appears she has frequently elevated BP in clinic, reports as high as 170s during HD. She is currently on amlodipine  10 mg, with ESRD on HD, nephrologist d/c'd losartan last year. Recommend getting a BP measurement at the same time every day in the evenings x 1 week vs amb BP clinic with pharmacy. Will reach out to CCM to see if we can provide patient with BP cuff. Need more data for optimal medication changes.  Constipation Recommend senna once daily after HD, can also add in and titrate miralax as needed after HD for goal of 1 soft BM daily.  Healthcare  maintenance #Discussed pap smear, patient to return in next 1-3 months for pap smear #Colonoscopy: referred in June of 2021 to [facility], still active referral. Patient's concern was how to do clean out and bowel prep with HD. Recommend she do clean out over a weekend and talk to [facility] about scheduling colonoscopy first thing on a Monday so that she can get to regular dialysis after colonoscopy. Patient did not want to do bowel prep if at HD the day before, which was previously recommended. Gave her number to call [facility]. #COVID-19: received third dose today #Hep C: since she is on HD, assumed screen negative with [facility], will obtain today.      [name] Damme, MD [place]

## 2021-01-13 NOTE — Assessment & Plan Note (Signed)
Will obtain lipid panel and CMP today. Presently on atorvastatin 40 mg daily.

## 2021-01-13 NOTE — Assessment & Plan Note (Signed)
Recommend senna once daily after HD, can also add in and titrate miralax as needed after HD for goal of 1 soft BM daily.

## 2021-01-13 NOTE — Assessment & Plan Note (Signed)
Patient on HD with low A1c, likely due to HD status. Will obtain fructosamine for better evaluation of diabetic control. Per report, patient has labile BG that can be in 200s, or to 70s when taking insulin and not eating carbs. Counseled that it is ok to count carbs and have some with meals to prevent lows. Currently on Lantus 12U. Followed by CCM, asked her to check fasting BG every day, but especially to keep log for the next week and I will follow up to ensure appropriate fasting numbers. Patient reports that she has seen a dietician in the past and would not like a referral at this time.

## 2021-01-13 NOTE — Assessment & Plan Note (Signed)
#  Discussed pap smear, patient to return in next 1-3 months for pap smear #Colonoscopy: referred in June of 2021 to [facility], still active referral. Patient's concern was how to do clean out and bowel prep with HD. Recommend she do clean out over a weekend and talk to [facility] about scheduling colonoscopy first thing on a Monday so that she can get to regular dialysis after colonoscopy. Patient did not want to do bowel prep if at HD the day before, which was previously recommended. Gave her number to call [facility]. #COVID-19: received third dose today #Hep C: since she is on HD, assumed screen negative with [facility], will obtain today.

## 2021-01-14 DIAGNOSIS — N2581 Secondary hyperparathyroidism of renal origin: Secondary | ICD-10-CM | POA: Diagnosis not present

## 2021-01-14 DIAGNOSIS — E1122 Type 2 diabetes mellitus with diabetic chronic kidney disease: Secondary | ICD-10-CM | POA: Diagnosis not present

## 2021-01-14 DIAGNOSIS — N186 End stage renal disease: Secondary | ICD-10-CM | POA: Diagnosis not present

## 2021-01-14 DIAGNOSIS — D631 Anemia in chronic kidney disease: Secondary | ICD-10-CM | POA: Diagnosis not present

## 2021-01-14 DIAGNOSIS — Z992 Dependence on renal dialysis: Secondary | ICD-10-CM | POA: Diagnosis not present

## 2021-01-15 NOTE — Telephone Encounter (Signed)
Spoke to patient made her aware of follow up call with RNCM on 01/16/2021

## 2021-01-15 NOTE — Chronic Care Management (AMB) (Signed)
  Care Management   Note  01/15/2021 Name: NOVALEIGH MOCHEL MRN: [id number] DOB: Jun 19, 1966  MELONDY TIFFIN is a 55 y.o. year old female who is a primary care patient of [name], MD and is actively engaged with the care management team. I reached out to [name] by phone today to assist with re-scheduling a follow up visit with the RN Case Manager.  Follow up plan: Telephone appointment with care management team member scheduled for:01/16/2021  [place] [facility]

## 2021-01-16 ENCOUNTER — Other Ambulatory Visit: Payer: Self-pay

## 2021-01-16 ENCOUNTER — Ambulatory Visit: Payer: Medicare Other

## 2021-01-16 DIAGNOSIS — E1122 Type 2 diabetes mellitus with diabetic chronic kidney disease: Secondary | ICD-10-CM | POA: Diagnosis not present

## 2021-01-16 DIAGNOSIS — D631 Anemia in chronic kidney disease: Secondary | ICD-10-CM | POA: Diagnosis not present

## 2021-01-16 DIAGNOSIS — N186 End stage renal disease: Secondary | ICD-10-CM | POA: Diagnosis not present

## 2021-01-16 DIAGNOSIS — Z992 Dependence on renal dialysis: Secondary | ICD-10-CM | POA: Diagnosis not present

## 2021-01-16 DIAGNOSIS — N2581 Secondary hyperparathyroidism of renal origin: Secondary | ICD-10-CM | POA: Diagnosis not present

## 2021-01-17 NOTE — Patient Instructions (Signed)
Visit Information  [name]  it was nice speaking with you. Please call me directly [phone] if you have questions about the goals we discussed.  Goals Addressed            This Visit's Progress   . Track and Manage My Blood Pressure-Hypertension       Timeframe:  Long-Range Goal Priority:  High Start Date:   01/16/21                          Expected End Date:  05/05/21                     Follow Up Date 02/13/21    - check blood pressure 3 times per week - choose a place to take my blood pressure (home, clinic or office, retail store) - write blood pressure results in a log or diary    Why is this important?    You won't feel high blood pressure, but it can still hurt your blood vessels.   High blood pressure can cause heart or kidney problems. It can also cause a stroke.   Making lifestyle changes like losing a little weight or eating less salt will help.   Checking your blood pressure at home and at different times of the day can help to control blood pressure.   If the doctor prescribes medicine remember to take it the way the doctor ordered.   Call the office if you cannot afford the medicine or if there are questions about it.     Notes:        The patient verbalized understanding of instructions, educational materials, and care plan provided today and declined offer to receive copy of patient instructions, educational materials, and care plan.   Follow up Plan:  Patient would like continued follow-up.  CCM RNCM will outreach to the patient in the next 7days to follow up with BP monitor and then 21 days for next visit.. Patient will call office if needed prior to next encounter   Lazaro Arms, RN

## 2021-01-17 NOTE — Chronic Care Management (AMB) (Signed)
Care Management    RN Visit Note  01/17/2021 Name: [name] MRN: [id number] DOB: 07-03-1966  Subjective: [name] is a 55 y.o. year old female who is a primary care patient of [name] Damme, MD. The care management team was consulted for assistance with disease management and care coordination needs.    Engaged with patient by telephone for follow up visit in response to provider referral for case management and/or care coordination services.   Consent to Services:   [name] was given information about Care Management services today including:  1. Care Management services includes personalized support from designated clinical staff supervised by her physician, including individualized plan of care and coordination with other care providers 2. 24/7 contact phone numbers for assistance for urgent and routine care needs. 3. The patient may stop case management services at any time by phone call to the office staff.  Patient agreed to services and consent obtained.    Assessment: Patient is currently experiencing difficulty with elevated blood pressures.. See Care Plan below for interventions and patient self-care actives. Follow up Plan: Patient would like continued follow-up.  CCM RNCM will outreach to the patient in the next 7days to follow up with BP monitor and then 21 days for next visit.. Patient will call office if needed prior to next encounter : Review of patient past medical history, allergies, medications, health status, including review of consultants reports, laboratory and other test data, was performed as part of comprehensive evaluation and provision of chronic care management services.   SDOH (Social Determinants of Health) assessments and interventions performed:    Care Plan  No Known Allergies  Outpatient Encounter Medications as of 01/16/2021  Medication Sig Note  . amLODipine (NORVASC) 10 MG tablet TAKE 1 TABLET BY MOUTH ONCE DAILY   .  atorvastatin (LIPITOR) 40 MG tablet TAKE 1 TABLET BY MOUTH ONCE DAILY   . B Complex-C-Folic Acid (DIALYVITE 176) 0.8 MG TABS Take 1 tablet by mouth daily.  06/24/2020: Unsure if she is taking  . blood glucose meter kit and supplies Dispense based on patient and insurance preference. Use up to four times daily as directed. (FOR ICD-10 E10.9, E11.9).   . ferric citrate (AURYXIA) 1 GM 210 MG(Fe) tablet  (Patient not taking: Reported on 09/23/2020)   . gabapentin (NEURONTIN) 100 MG capsule TAKE 1 CAPSULE (100 MG TOTAL) BY MOUTH DAILY.   [name] glucose blood test strip Use as instructed, for testing blood 4 times daily.   . insulin glargine (LANTUS) 100 UNIT/ML Solostar Pen Inject 12 Units into the skin 2 (two) times daily.   . Insulin Isophane & Regular Human (HUMULIN 70/30 KWIKPEN) (70-30) 100 UNIT/ML PEN Inject 12 Units into the skin 2 (two) times daily. (Patient not taking: Reported on 09/23/2020)   . Insulin Pen Needle (EASY TOUCH PEN NEEDLES) 32G X 4 MM MISC 1 each by Does not apply route 3 (three) times daily.   Elmore Guise Devices (ONETOUCH DELICA PLUS LANCING) MISC CHECK BLOOD SUGARS FOURX DAILY   . Lancets (ONETOUCH ULTRASOFT) lancets Use as instructed   . losartan (COZAAR) 50 MG tablet Take 1 tablet (50 mg total) by mouth daily.   . mupirocin cream (BACTROBAN) 2 % Apply 1 application topically 2 (two) times daily. (Patient not taking: Reported on 09/23/2020)   . pantoprazole (PROTONIX) 20 MG tablet Take 1 tablet (20 mg total) by mouth daily.   . promethazine (PHENERGAN) 12.5 MG tablet Take 1 tablet (12.5 mg total)  by mouth every 6 (six) hours as needed for up to 10 doses for nausea or vomiting. (Patient not taking: Reported on 09/23/2020)   . senna-docusate (SENOKOT-S) 8.6-50 MG tablet Take 1 tablet by mouth daily.   . Sodium Chloride Flush (SALINE FLUSH) 0.9 % SOLN Inject 5 mLs into the vein 3 (three) times daily. (Patient not taking: Reported on 09/23/2020)    No facility-administered encounter  medications on file as of 01/16/2021.    Patient Active Problem List   Diagnosis Date Noted  . Constipation 09/23/2020  . Dizziness 03/25/2020  . Diarrhea, unspecified 12/27/2018  . ESRD on dialysis ([place])   . Chronic cough 10/26/2017  . Diabetic peripheral neuropathy ([place]) 03/17/2017  . Hyperlipidemia 03/17/2017  . S/P BKA (below knee amputation) unilateral, right ([place]) 01/04/2017  . History of alcohol abuse 01/04/2017  . Legally blind 01/04/2017  . Encounter for screening for respiratory tuberculosis 05/05/2016  . Unspecified protein-calorie malnutrition ([place]) 05/05/2016  . Anemia in chronic kidney disease 05/05/2016  . Secondary hyperparathyroidism of renal origin ([place]) 05/05/2016  . Hypokalemia 08/29/2012  . Type 2 diabetes mellitus ([place]) 09/01/2011  . Secondary hypertension, unspecified 09/01/2011  . Anemia     Conditions to be addressed/monitored: HTN and DMII  Care Plan : RN Case Manager  Updates made by Lazaro Arms, RN since 01/17/2021 12:00 AM  Problem: Glycemic Management and Hypertension   Long-Range Goal: Glycemic Management and Hypertention   Start Date: 06/24/2020  Expected End Date: 05/05/2021  Recent Progress: On track  Priority: High  Note:   Objective:  Lab Results  Component Value Date   HGBA1C 7.2 (A) 09/23/2020 .   Lab Results  Component Value Date   CREATININE 6.48 (H) 03/20/2020   CREATININE 5.82 (H) 12/26/2018   CREATININE 9.67 (H) 12/25/2018   Current Barriers:  [name] Knowledge Deficits related to basic Diabetes pathophysiology and self care/management . Knowledge Deficits related to basic Hypertension pathophysiology and self care/management.  Case Manager Clinical Goal(s):  [name] Over the next 90 days, patient will demonstrate improved adherence to prescribed treatment plan for hypertension as evidenced by taking all medications as prescribed, monitoring and recording blood pressure as directed, adhering to low sodium/DASH diet . Over the next 90  days, patient will demonstrate improved adherence to prescribed treatment plan for diabetes self care/management as evidenced by: maintaining her blood sugars in the range on 125-135  Interventions:  . Reviewed medications with patient and discussed importance of medication adherence . Reviewed scheduled/upcoming provider appointments including: patient has an appointment with GI for colonoscopy on 01/13/21.  She is concerned because it sounds like she has a 2 day prep and has dialysis on Monday and doesn't know what that will look like and having to go the bathroom. RNCM advised the patient to call and talk with the office about her concerns. . Review of patient status, including review of consultants reports, relevant laboratory and other test results, and medications completed. [name] activity based on tolerance and functional limitations encouraged . healthy lifestyle promoted . modest weight loss  promoted . quality of sleep assessed-sleeps well DM II . blood glucose monitoring encouraged . blood glucose readings reviewed patient states that they are ranging 125-130 . mutual A1C goal set or reviewed 7 or below HTN . reduction of dietary sodium encouraged . support and encouragement provided . home or ambulatory blood pressure monitoring encouraged . barriers to lifestyle change identified-patient does not have a BP monitor and RNCM will help provide the patient with  a monitor . support and encouragement provided . BP at office appointment 158/70  Patient Goals/Self Care Activities:   Patient verbalizes understanding of plan to have her mate to help administers injections and she self administer pills as prescribed  Calls pharmacy for medication refills  Call's provider office for new concerns or questions  check blood sugar at prescribed times  Calls provider office for new concerns, questions, or BP outside discussed parameters  Checks BP and record as discussed when provided with  monitor     Lazaro Arms RN, BSN, [place] Phone: [phone] I Fax: [phone]

## 2021-01-19 ENCOUNTER — Telehealth: Payer: Self-pay | Admitting: Family Medicine

## 2021-01-19 DIAGNOSIS — N186 End stage renal disease: Secondary | ICD-10-CM | POA: Diagnosis not present

## 2021-01-19 DIAGNOSIS — N2581 Secondary hyperparathyroidism of renal origin: Secondary | ICD-10-CM | POA: Diagnosis not present

## 2021-01-19 DIAGNOSIS — E1122 Type 2 diabetes mellitus with diabetic chronic kidney disease: Secondary | ICD-10-CM | POA: Diagnosis not present

## 2021-01-19 DIAGNOSIS — Z992 Dependence on renal dialysis: Secondary | ICD-10-CM | POA: Diagnosis not present

## 2021-01-19 DIAGNOSIS — D631 Anemia in chronic kidney disease: Secondary | ICD-10-CM | POA: Diagnosis not present

## 2021-01-19 NOTE — Telephone Encounter (Signed)
At last visit, patient was unable to get blood work due to vasculopathy (HD patient). Recommend she go to [facility] for blood draw. Noticed that results have not returned. Attempted to call to remind patient to go get blood work. Left VM. Will try calling again.  [name], MD [facility], PGY-2

## 2021-01-20 ENCOUNTER — Other Ambulatory Visit: Payer: Self-pay | Admitting: Family Medicine

## 2021-01-21 DIAGNOSIS — D631 Anemia in chronic kidney disease: Secondary | ICD-10-CM | POA: Diagnosis not present

## 2021-01-21 DIAGNOSIS — N186 End stage renal disease: Secondary | ICD-10-CM | POA: Diagnosis not present

## 2021-01-21 DIAGNOSIS — Z992 Dependence on renal dialysis: Secondary | ICD-10-CM | POA: Diagnosis not present

## 2021-01-21 DIAGNOSIS — N2581 Secondary hyperparathyroidism of renal origin: Secondary | ICD-10-CM | POA: Diagnosis not present

## 2021-01-21 DIAGNOSIS — E1122 Type 2 diabetes mellitus with diabetic chronic kidney disease: Secondary | ICD-10-CM | POA: Diagnosis not present

## 2021-01-22 ENCOUNTER — Telehealth: Payer: Self-pay

## 2021-01-22 NOTE — Telephone Encounter (Signed)
  Care Management     RN Outreach Note  01/22/2021 Name: [name] MRN: [id number] DOB: [birth year]  [name] is a 55 y.o. year old female who is a primary care patient of [name], MD . [place] reached out to [name] today by phone to discuss BP monitor.  RNCM informed the patient that I have a BP monitor and it is at the front desk waiting for her to pick up.  She states that she will have a family member to come and get it for her.  It may take a couple of days but she will get it.      Follow Up Plan: RNCM will talk with the patient at the next scheduled interval.  Lazaro Arms RN, BSN, [place] Phone: [phone] I Fax: [phone]    .

## 2021-01-23 DIAGNOSIS — D631 Anemia in chronic kidney disease: Secondary | ICD-10-CM | POA: Diagnosis not present

## 2021-01-23 DIAGNOSIS — E1122 Type 2 diabetes mellitus with diabetic chronic kidney disease: Secondary | ICD-10-CM | POA: Diagnosis not present

## 2021-01-23 DIAGNOSIS — N186 End stage renal disease: Secondary | ICD-10-CM | POA: Diagnosis not present

## 2021-01-23 DIAGNOSIS — N2581 Secondary hyperparathyroidism of renal origin: Secondary | ICD-10-CM | POA: Diagnosis not present

## 2021-01-23 DIAGNOSIS — Z992 Dependence on renal dialysis: Secondary | ICD-10-CM | POA: Diagnosis not present

## 2021-01-26 DIAGNOSIS — N186 End stage renal disease: Secondary | ICD-10-CM | POA: Diagnosis not present

## 2021-01-26 DIAGNOSIS — E1122 Type 2 diabetes mellitus with diabetic chronic kidney disease: Secondary | ICD-10-CM | POA: Diagnosis not present

## 2021-01-26 DIAGNOSIS — N2581 Secondary hyperparathyroidism of renal origin: Secondary | ICD-10-CM | POA: Diagnosis not present

## 2021-01-26 DIAGNOSIS — D631 Anemia in chronic kidney disease: Secondary | ICD-10-CM | POA: Diagnosis not present

## 2021-01-26 DIAGNOSIS — Z992 Dependence on renal dialysis: Secondary | ICD-10-CM | POA: Diagnosis not present

## 2021-01-27 ENCOUNTER — Encounter: Payer: Self-pay | Admitting: Internal Medicine

## 2021-01-28 DIAGNOSIS — N2581 Secondary hyperparathyroidism of renal origin: Secondary | ICD-10-CM | POA: Diagnosis not present

## 2021-01-28 DIAGNOSIS — N186 End stage renal disease: Secondary | ICD-10-CM | POA: Diagnosis not present

## 2021-01-28 DIAGNOSIS — Z992 Dependence on renal dialysis: Secondary | ICD-10-CM | POA: Diagnosis not present

## 2021-01-28 DIAGNOSIS — E1122 Type 2 diabetes mellitus with diabetic chronic kidney disease: Secondary | ICD-10-CM | POA: Diagnosis not present

## 2021-01-28 DIAGNOSIS — D631 Anemia in chronic kidney disease: Secondary | ICD-10-CM | POA: Diagnosis not present

## 2021-01-30 DIAGNOSIS — N2581 Secondary hyperparathyroidism of renal origin: Secondary | ICD-10-CM | POA: Diagnosis not present

## 2021-01-30 DIAGNOSIS — Z992 Dependence on renal dialysis: Secondary | ICD-10-CM | POA: Diagnosis not present

## 2021-01-30 DIAGNOSIS — E1122 Type 2 diabetes mellitus with diabetic chronic kidney disease: Secondary | ICD-10-CM | POA: Diagnosis not present

## 2021-01-30 DIAGNOSIS — N186 End stage renal disease: Secondary | ICD-10-CM | POA: Diagnosis not present

## 2021-01-30 DIAGNOSIS — D631 Anemia in chronic kidney disease: Secondary | ICD-10-CM | POA: Diagnosis not present

## 2021-02-02 DIAGNOSIS — N2581 Secondary hyperparathyroidism of renal origin: Secondary | ICD-10-CM | POA: Diagnosis not present

## 2021-02-02 DIAGNOSIS — D631 Anemia in chronic kidney disease: Secondary | ICD-10-CM | POA: Diagnosis not present

## 2021-02-02 DIAGNOSIS — Z992 Dependence on renal dialysis: Secondary | ICD-10-CM | POA: Diagnosis not present

## 2021-02-02 DIAGNOSIS — N186 End stage renal disease: Secondary | ICD-10-CM | POA: Diagnosis not present

## 2021-02-02 DIAGNOSIS — E1122 Type 2 diabetes mellitus with diabetic chronic kidney disease: Secondary | ICD-10-CM | POA: Diagnosis not present

## 2021-02-13 ENCOUNTER — Ambulatory Visit: Payer: Medicare Other

## 2021-02-14 NOTE — Chronic Care Management (AMB) (Signed)
Care Management    RN Visit Note  02/14/2021 Name: [name] MRN: [id number] DOB: 1965/12/09  Subjective: [name] is a 55 y.o. year old female who is a primary care patient of [name], MD. The care management team was consulted for assistance with disease management and care coordination needs.    Engaged with patient by telephone for follow up visit in response to provider referral for case management and/or care coordination services.   Consent to Services:   [name] was given information about Care Management services today including:  1. Care Management services includes personalized support from designated clinical staff supervised by her physician, including individualized plan of care and coordination with other care providers 2. 24/7 contact phone numbers for assistance for urgent and routine care needs. 3. The patient may stop case management services at any time by phone call to the office staff.  Patient agreed to services and consent obtained.    Assessment: Patient is not checking her BP.  She has not picked up the BP monitor she has waiting for her to pick up.. See Care Plan below for interventions and patient self-care actives. Follow up Plan: Patient would like continued follow-up.  CCM RNCM will outreach the paatient within the next 30 days.. Patient will call office if needed prior to next encounter  Review of patient past medical history, allergies, medications, health status, including review of consultants reports, laboratory and other test data, was performed as part of comprehensive evaluation and provision of chronic care management services.   SDOH (Social Determinants of Health) assessments and interventions performed:    Care Plan  No Known Allergies  Outpatient Encounter Medications as of 02/13/2021  Medication Sig Note  . amLODipine (NORVASC) 10 MG tablet TAKE 1 TABLET BY MOUTH ONCE DAILY   . atorvastatin (LIPITOR) 40 MG tablet  TAKE 1 TABLET BY MOUTH ONCE DAILY   . B Complex-C-Folic Acid (DIALYVITE 295) 0.8 MG TABS Take 1 tablet by mouth daily.  06/24/2020: Unsure if she is taking  . blood glucose meter kit and supplies Dispense based on patient and insurance preference. Use up to four times daily as directed. (FOR ICD-10 E10.9, E11.9).   . ferric citrate (AURYXIA) 1 GM 210 MG(Fe) tablet  (Patient not taking: Reported on 09/23/2020)   . gabapentin (NEURONTIN) 100 MG capsule TAKE 1 CAPSULE (100 MG TOTAL) BY MOUTH DAILY.   [name] glucose blood test strip Use as instructed, for testing blood 4 times daily.   . insulin glargine (LANTUS) 100 UNIT/ML Solostar Pen Inject 12 Units into the skin 2 (two) times daily.   . Insulin Isophane & Regular Human (HUMULIN 70/30 KWIKPEN) (70-30) 100 UNIT/ML PEN Inject 12 Units into the skin 2 (two) times daily. (Patient not taking: Reported on 09/23/2020)   . Insulin Pen Needle (EASY TOUCH PEN NEEDLES) 32G X 4 MM MISC 1 each by Does not apply route 3 (three) times daily.   Elmore Guise Devices (ONETOUCH DELICA PLUS LANCING) MISC CHECK BLOOD SUGARS FOURX DAILY   . Lancets (ONETOUCH ULTRASOFT) lancets Use as instructed   . losartan (COZAAR) 50 MG tablet Take 1 tablet (50 mg total) by mouth daily.   . mupirocin cream (BACTROBAN) 2 % Apply 1 application topically 2 (two) times daily. (Patient not taking: Reported on 09/23/2020)   . pantoprazole (PROTONIX) 20 MG tablet Take 1 tablet (20 mg total) by mouth daily.   . promethazine (PHENERGAN) 12.5 MG tablet Take 1 tablet (12.5 mg  total) by mouth every 6 (six) hours as needed for up to 10 doses for nausea or vomiting. (Patient not taking: Reported on 09/23/2020)   . senna-docusate (SENOKOT-S) 8.6-50 MG tablet Take 1 tablet by mouth daily.   . Sodium Chloride Flush (SALINE FLUSH) 0.9 % SOLN Inject 5 mLs into the vein 3 (three) times daily. (Patient not taking: Reported on 09/23/2020)    No facility-administered encounter medications on file as of 02/13/2021.     Patient Active Problem List   Diagnosis Date Noted  . Constipation 09/23/2020  . Dizziness 03/25/2020  . Diarrhea, unspecified 12/27/2018  . ESRD on dialysis ([place])   . Chronic cough 10/26/2017  . Diabetic peripheral neuropathy ([place]) 03/17/2017  . Hyperlipidemia 03/17/2017  . S/P BKA (below knee amputation) unilateral, right ([place]) 01/04/2017  . History of alcohol abuse 01/04/2017  . Legally blind 01/04/2017  . Encounter for screening for respiratory tuberculosis 05/05/2016  . Unspecified protein-calorie malnutrition ([place]) 05/05/2016  . Anemia in chronic kidney disease 05/05/2016  . Secondary hyperparathyroidism of renal origin ([place]) 05/05/2016  . Hypokalemia 08/29/2012  . Type 2 diabetes mellitus ([place]) 09/01/2011  . Secondary hypertension, unspecified 09/01/2011  . Anemia     Conditions to be addressed/monitored: HTN and DMII  Care Plan : RN Case Manager  Updates made by Lazaro Arms, RN since 02/14/2021 12:00 AM  Problem: Glycemic Management and Hypertension   Long-Range Goal: Glycemic Management and Hypertention   Start Date: 06/24/2020  Expected End Date: 05/05/2021  Recent Progress: On track  Priority: High  Note:   Objective:  Lab Results  Component Value Date   HGBA1C 7.2 (A) 09/23/2020 .   Lab Results  Component Value Date   CREATININE 6.48 (H) 03/20/2020   CREATININE 5.82 (H) 12/26/2018   CREATININE 9.67 (H) 12/25/2018   Current Barriers:  [name] Knowledge Deficits related to basic Diabetes pathophysiology and self care/management . Knowledge Deficits related to basic Hypertension pathophysiology and self care/management.  Case Manager Clinical Goal(s):  [name] Over the next 90 days, patient will demonstrate improved adherence to prescribed treatment plan for hypertension as evidenced by taking all medications as prescribed, monitoring and recording blood pressure as directed, adhering to low sodium/DASH diet . Over the next 90 days, patient will demonstrate improved  adherence to prescribed treatment plan for diabetes self care/management as evidenced by: maintaining her blood sugars in the range on 125-135  Interventions:  . Reviewed medications with patient and discussed importance of medication adherence . Reviewed scheduled/upcoming provider appointments including: patient has an appointment  GI for colonoscopy in April.  . Review of patient status, including review of consultants reports, relevant laboratory and other test results, and medications completed. [name] activity based on tolerance and functional limitations encouraged . healthy lifestyle promoted . modest weight loss  promoted . quality of sleep assessed-sleeps well DM II . blood glucose monitoring encouraged . blood glucose readings reviewed patient states that they are ranging 137-140 . mutual A1C goal set or reviewed 7 or below HTN . reduction of dietary sodium encouraged . support and encouragement provided . home or ambulatory blood pressure monitoring encouraged . barriers to lifestyle change identified-patient still has not gone to the office to pick up the BP monitor .  She states that she will have someone in her family to pick it up for her. I advised her that it is at the front desk with her name on it. . support and encouragement provided  Patient Goals/Self Care Activities:   Patient  verbalizes understanding of plan to have her mate to help administers injections and she self administer pills as prescribed  Calls pharmacy for medication refills  Call's provider office for new concerns or questions  check blood sugar at prescribed times  Calls provider office for new concerns, questions, or BP outside discussed parameters  Checks BP and record as discussed when provided with monitor     Lazaro Arms RN, BSN, [place] Phone: [phone] I Fax: [phone]

## 2021-02-14 NOTE — Patient Instructions (Signed)
Visit Information  Ms. Auguste  it was nice speaking with you. Please call me directly [phone] if you have questions about the goals we discussed.  Goals Addressed            This Visit's Progress   . Monitor and Manage My Blood Sugar       Timeframe:  Long-Range Goal Priority:  High Start Date:   09/04/21                          Expected End Date:   05/05/21                    Patient Goals/Self Care Activities:   Patient verbalizes understanding of plan to have her mate to help administers injections and she self administer pills as prescribed  Calls pharmacy for medication refills  Call's provider office for new concerns or questions  check blood sugar at prescribed times  check blood sugar if I feel it is too high or too low  tFollow- the GI office to express her concerns    . Track and Manage My Blood Pressure-Hypertension       Timeframe:  Long-Range Goal Priority:  High Start Date:   01/16/21                          Expected End Date:  05/05/21                     Follow Up Date 03/06/21    - check blood pressure 3 times per week - choose a place to take my blood pressure (home, clinic or office, retail store) - write blood pressure results in a log or diary    Why is this important?    You won't feel high blood pressure, but it can still hurt your blood vessels.   High blood pressure can cause heart or kidney problems. It can also cause a stroke.   Making lifestyle changes like losing a little weight or eating less salt will help.   Checking your blood pressure at home and at different times of the day can help to control blood pressure.   If the doctor prescribes medicine remember to take it the way the doctor ordered.   Call the office if you cannot afford the medicine or if there are questions about it.     Notes:        The patient verbalized understanding of instructions, educational materials, and care plan provided today and declined offer  to receive copy of patient instructions, educational materials, and care plan.   Follow up Plan: Patient would like continued follow-up.  CCM RNCM will outreach the patient within the next 30 days.. Patient will call office if needed prior to next encounter  Lazaro Arms, RN

## 2021-02-16 ENCOUNTER — Other Ambulatory Visit: Payer: Self-pay | Admitting: Family Medicine

## 2021-03-06 ENCOUNTER — Ambulatory Visit: Payer: Medicare Other

## 2021-03-08 NOTE — Chronic Care Management (AMB) (Signed)
Care Management    RN Visit Note  03/08/2021 Name: [name] MRN: [id number] DOB: May 02, 1966  Subjective: [name] is a 55 y.o. year old female who is a primary care patient of [name] Damme, MD. The care management team was consulted for assistance with disease management and care coordination needs.    Engaged with patient by telephone for follow up visit in response to provider referral for case management and/or care coordination services.   Consent to Services:   Ms. Darrough was given information about Care Management services today including:  1. Care Management services includes personalized support from designated clinical staff supervised by her physician, including individualized plan of care and coordination with other care providers 2. 24/7 contact phone numbers for assistance for urgent and routine care needs. 3. The patient may stop case management services at any time by phone call to the office staff.  Patient agreed to services and consent obtained.    Assessment: The patient is making progress with her diabetes and working on her blood pressure.. See Care Plan below for interventions and patient self-care actives. Follow up Plan: Patient would like continued follow-up.  CCM RNCM will outreach the patient within the next 30 days  Patient will call office if needed prior to next encounter : Review of patient past medical history, allergies, medications, health status, including review of consultants reports, laboratory and other test data, was performed as part of comprehensive evaluation and provision of chronic care management services.   SDOH (Social Determinants of Health) assessments and interventions performed:    Care Plan  No Known Allergies  Outpatient Encounter Medications as of 03/06/2021  Medication Sig Note  . amLODipine (NORVASC) 10 MG tablet TAKE 1 TABLET BY MOUTH ONCE DAILY   . atorvastatin (LIPITOR) 40 MG tablet TAKE 1 TABLET BY MOUTH  ONCE DAILY   . B Complex-C-Folic Acid (DIALYVITE 528) 0.8 MG TABS Take 1 tablet by mouth daily.  06/24/2020: Unsure if she is taking  . blood glucose meter kit and supplies Dispense based on patient and insurance preference. Use up to four times daily as directed. (FOR ICD-10 E10.9, E11.9).   . ferric citrate (AURYXIA) 1 GM 210 MG(Fe) tablet  (Patient not taking: Reported on 09/23/2020)   . gabapentin (NEURONTIN) 100 MG capsule TAKE 1 CAPSULE (100 MG TOTAL) BY MOUTH DAILY.   [name] glucose blood test strip Use as instructed, for testing blood 4 times daily.   . insulin glargine (LANTUS) 100 UNIT/ML Solostar Pen Inject 12 Units into the skin 2 (two) times daily.   . Insulin Isophane & Regular Human (HUMULIN 70/30 KWIKPEN) (70-30) 100 UNIT/ML PEN Inject 12 Units into the skin 2 (two) times daily. (Patient not taking: Reported on 09/23/2020)   . Insulin Pen Needle (EASY TOUCH PEN NEEDLES) 32G X 4 MM MISC 1 each by Does not apply route 3 (three) times daily.   Elmore Guise Devices (ONETOUCH DELICA PLUS LANCING) MISC CHECK BLOOD SUGARS FOURX DAILY   . Lancets (ONETOUCH ULTRASOFT) lancets Use as instructed   . losartan (COZAAR) 50 MG tablet Take 1 tablet (50 mg total) by mouth daily.   . mupirocin cream (BACTROBAN) 2 % Apply 1 application topically 2 (two) times daily. (Patient not taking: Reported on 09/23/2020)   . pantoprazole (PROTONIX) 20 MG tablet Take 1 tablet (20 mg total) by mouth daily.   . promethazine (PHENERGAN) 12.5 MG tablet Take 1 tablet (12.5 mg total) by mouth every 6 (six) hours as  needed for up to 10 doses for nausea or vomiting. (Patient not taking: Reported on 09/23/2020)   . senna-docusate (SENOKOT-S) 8.6-50 MG tablet Take 1 tablet by mouth daily.   . Sodium Chloride Flush (SALINE FLUSH) 0.9 % SOLN Inject 5 mLs into the vein 3 (three) times daily. (Patient not taking: Reported on 09/23/2020)    No facility-administered encounter medications on file as of 03/06/2021.    Patient Active  Problem List   Diagnosis Date Noted  . Constipation 09/23/2020  . Dizziness 03/25/2020  . Diarrhea, unspecified 12/27/2018  . ESRD on dialysis ([place])   . Chronic cough 10/26/2017  . Diabetic peripheral neuropathy ([place]) 03/17/2017  . Hyperlipidemia 03/17/2017  . S/P BKA (below knee amputation) unilateral, right ([place]) 01/04/2017  . History of alcohol abuse 01/04/2017  . Legally blind 01/04/2017  . Encounter for screening for respiratory tuberculosis 05/05/2016  . Unspecified protein-calorie malnutrition ([place]) 05/05/2016  . Anemia in chronic kidney disease 05/05/2016  . Secondary hyperparathyroidism of renal origin ([place]) 05/05/2016  . Hypokalemia 08/29/2012  . Type 2 diabetes mellitus ([place]) 09/01/2011  . Secondary hypertension, unspecified 09/01/2011  . Anemia     Conditions to be addressed/monitored: HTN and DMII  Care Plan : RN Case Manager  Updates made by Lazaro Arms, RN since 03/08/2021 12:00 AM  Problem: Glycemic Management and Hypertension   Long-Range Goal: Glycemic Management and Hypertention   Start Date: 06/24/2020  Expected End Date: 06/04/2021  Recent Progress: On track  Priority: High  Note:   Objective:  Lab Results  Component Value Date   HGBA1C 6.9 (A) 01/13/2021 .   Lab Results  Component Value Date   CREATININE 6.48 (H) 03/20/2020   CREATININE 5.82 (H) 12/26/2018   CREATININE 9.67 (H) 12/25/2018   Current Barriers:  [name] Knowledge Deficits related to basic Diabetes pathophysiology and self care/management . Knowledge Deficits related to basic Hypertension pathophysiology and self care/management.  Case Manager Clinical Goal(s):  [name] Over the next 90 days, patient will demonstrate improved adherence to prescribed treatment plan for hypertension as evidenced by taking all medications as prescribed, monitoring and recording blood pressure as directed, adhering to low sodium/DASH diet . Over the next 90 days, patient will demonstrate improved adherence to  prescribed treatment plan for diabetes self care/management as evidenced by: maintaining her blood sugars in the range on 125-135  Interventions:  . Reviewed medications with patient and discussed importance of medication adherence . Reviewed scheduled/upcoming provider appointments including: patient has an appointment  GI for colonoscopy in April.  . Review of patient status, including review of consultants reports, relevant laboratory and other test results, and medications completed. [name] activity based on tolerance and functional limitations encouraged . healthy lifestyle promoted . modest weight loss  promoted . quality of sleep assessed-sleeps well DM II . blood glucose monitoring encouraged . blood glucose readings reviewed -150 . mutual A1C goal set or reviewed 7 or below HTN . reduction of dietary sodium encouraged . support and encouragement provided . home or ambulatory blood pressure monitoring encouraged - . barriers to lifestyle change identified-patient did not get the BP  monitor at the office and ordered her one from her insurance.  She has not been checking her BP regularly but states that she will start with the help of her boyfriend.  She denies any symptoms of HTN. . support and encouragement provided  Patient Goals/Self Care Activities:   Patient verbalizes understanding of plan to have her mate to help administers injections and she  self administer pills as prescribed  Calls pharmacy for medication refills  Call's provider office for new concerns or questions  check blood sugar at prescribed times  Calls provider office for new concerns, questions, or BP outside discussed parameters  Checks BP and record as discussed when provided with monitor     Lazaro Arms RN, BSN, [place] Phone: [phone] I Fax: [phone]

## 2021-03-08 NOTE — Patient Instructions (Signed)
Visit Information  [name]  it was nice speaking with you. Please call me directly [phone] if you have questions about the goals we discussed.  Goals Addressed            This Visit's Progress   . Monitor and Manage My Blood Sugar       Timeframe:  Long-Range Goal Priority:  High Start Date:   09/04/21                          Expected End Date:   06/04/21              Patient Goals/Self Care Activities:   Patient verbalizes understanding of plan to have her mate to help administers injections and she self administer pills as prescribed  Calls pharmacy for medication refills  Call's provider office for new concerns or questions  check blood sugar at prescribed times  check blood sugar if I feel it is too high or too low  tFollow- the GI office to express her concerns    . Track and Manage My Blood Pressure-Hypertension       Timeframe:  Long-Range Goal Priority:  High Start Date:   01/16/21                          Expected End Date: 06/04/21                        - check blood pressure 3 times per week - choose a place to take my blood pressure (home, clinic or office, retail store) - write blood pressure results in a log or diary    Why is this important?    You won't feel high blood pressure, but it can still hurt your blood vessels.   High blood pressure can cause heart or kidney problems. It can also cause a stroke.   Making lifestyle changes like losing a little weight or eating less salt will help.   Checking your blood pressure at home and at different times of the day can help to control blood pressure.   If the doctor prescribes medicine remember to take it the way the doctor ordered.   Call the office if you cannot afford the medicine or if there are questions about it.     Notes:        The patient verbalized understanding of instructions, educational materials, and care plan provided today and declined offer to receive copy of patient  instructions, educational materials, and care plan.   Follow up Plan: Patient would like continued follow-up.  CCM RNCM will outreach the patient within the next 30 days  Patient will call office if needed prior to next encounter  Lazaro Arms, RN  [phone]

## 2021-04-03 ENCOUNTER — Encounter: Payer: Self-pay | Admitting: Internal Medicine

## 2021-04-06 ENCOUNTER — Telehealth: Payer: Self-pay

## 2021-04-06 ENCOUNTER — Telehealth: Payer: Medicare Other

## 2021-04-06 NOTE — Telephone Encounter (Signed)
   RN Case Manager Care Management   Phone Outreach    04/06/2021 Name: [name] MRN: [id number] DOB: [birth year]  [name] is a 55 y.o. year old female who is a primary care patient of [name], MD .   Telephone outreach was unsuccessful  Plan:If no return call is received, Will reach out to patient again in the next 04/20/21 @ 1145 .   Review of patient status, including review of consultants reports, relevant laboratory and other test results, and collaboration with appropriate care team members and the patient's provider was performed as part of comprehensive patient evaluation and provision of care management services.    Lazaro Arms RN, BSN, [facility] Care Management Coordinator [place] Phone: [phone] I Fax: [phone]

## 2021-04-20 ENCOUNTER — Telehealth: Payer: Medicare Other

## 2021-04-20 ENCOUNTER — Telehealth: Payer: Self-pay | Admitting: *Deleted

## 2021-04-20 NOTE — Chronic Care Management (AMB) (Signed)
  Care Management   Note  04/20/2021 Name: [name] MRN: [id number] DOB: Mar 24, 1966  [name] is a 55 y.o. year old female who is a primary care patient of [name], MD and is actively engaged with the care management team. I reached out to [name] by phone today to assist with re-scheduling a follow up visit with the RN Case Manager  Follow up plan: Unsuccessful telephone outreach attempt made. A HIPAA compliant phone message was left for the patient providing contact information and requesting a return call.  The care management team will reach out to the patient again over the next 7 days.  If patient returns call to provider office, please advise to call [place] at [phone].  [place] Management

## 2021-04-27 NOTE — Chronic Care Management (AMB) (Signed)
  Care Management   Note  04/27/2021 Name: [name] MRN: [id number] DOB: 11/26/1966  [name] is a 55 y.o. year old female who is a primary care patient of [name], MD and is actively engaged with the care management team. I reached out to [name] by phone today to assist with re-scheduling a follow up visit with the RN Case Manager  Follow up plan: A telephone outreach attempt made. The care management team will reach out to the patient again over the next 7 days. If patient returns call to provider office, please advise to call [place] at [phone].  [place] Management

## 2021-04-28 NOTE — Chronic Care Management (AMB) (Signed)
  Care Management   Note  04/28/2021 Name: [name] MRN: [id number] DOB: 1966-01-20  [name] is a 55 y.o. year old female who is a primary care patient of [name], MD and is actively engaged with the care management team. I reached out to [name] by phone today to assist with re-scheduling a follow up visit with the RN Case Manager  Follow up plan: Telephone appointment with care management team member scheduled for:05/26/2021  [name]  Care Guide, Embedded Care Coordination [place]  Care Management

## 2021-05-06 ENCOUNTER — Other Ambulatory Visit: Payer: Self-pay

## 2021-05-06 MED ORDER — AMLODIPINE BESYLATE 10 MG PO TABS: 1.0000 | ORAL_TABLET | Freq: Every day | ORAL | 3 refills | Status: DC

## 2021-05-26 ENCOUNTER — Telehealth: Payer: Medicare Other

## 2021-05-26 ENCOUNTER — Telehealth: Payer: Self-pay | Admitting: Licensed Clinical Social Worker

## 2021-05-26 NOTE — Chronic Care Management (AMB) (Signed)
    Clinical Social Work  Care Management   Phone Outreach    05/26/2021 Name: [name] MRN: [id number] DOB: December 24, 1965  [name] is a 55 y.o. year old female who is a primary care patient of [name], MD .   Telephone outreach for CCM check in for CCM RN and to reschedule phone appointment was unsuccessful . A HIPPA compliant phone message was left for the patient providing contact information and requesting a return call.   Plan:Will route chart to Care Guide to reschedule phone appointment with CCM RN.  Review of patient status, including review of consultants reports, relevant laboratory and other test results, and collaboration with appropriate care team members and the patient's provider was performed as part of comprehensive patient evaluation and provision of care management services.    Casimer Lanius, [place] / [place]   [phone] 11:48 AM

## 2021-06-23 ENCOUNTER — Telehealth: Payer: Self-pay | Admitting: *Deleted

## 2021-06-23 NOTE — Chronic Care Management (AMB) (Signed)
  Care Management   Note  06/23/2021 Name: [name] MRN: [id number] DOB: 1966-11-25  [name] is a 55 y.o. year old female who is a primary care patient of [name], MD and is actively engaged with the care management team. I reached out to [name] by phone today to assist with re-scheduling a follow up visit with the RN Case Manager  Follow up plan: Unsuccessful telephone outreach attempt made. A HIPAA compliant phone message was left for the patient providing contact information and requesting a return call.  The care management team will reach out to the patient again over the next 7 days.  If patient returns call to provider office, please advise to call [place] at [phone].  [place] Management

## 2021-06-30 NOTE — Chronic Care Management (AMB) (Signed)
  Care Management   Note  06/30/2021 Name: [name] MRN: [id number] DOB: 08/18/1966  [name] is a 55 y.o. year old female who is a primary care patient of [name], MD and is actively engaged with the care management team. I reached out to [name] by phone today to assist with re-scheduling a follow up visit with the RN Case Manager  Follow up plan: Unsuccessful telephone outreach attempt made. A HIPAA compliant phone message was left for the patient providing contact information and requesting a return call.  The care management team will reach out to the patient again over the next 7 days.  If patient returns call to provider office, please advise to call [place] at [phone].  [place] Management  Direct Dial: [phone]

## 2021-07-02 ENCOUNTER — Other Ambulatory Visit: Payer: Self-pay | Admitting: Family Medicine

## 2021-07-07 NOTE — Chronic Care Management (AMB) (Signed)
  Care Management   Note  07/07/2021 Name: LASHAYA NOVINGER MRN: [id number] DOB: July 04, 1966  NYAJAH MINICHIELLO is a 55 y.o. year old female who is a primary care patient of [name], MD and is actively engaged with the care management team. I reached out to [name] by phone today to assist with re-scheduling a follow up visit with the RN Case Manager  Follow up plan: A third unsuccessful telephone outreach attempt made. A HIPAA compliant phone message was left for the patient providing contact information and requesting a return call. We have been unable to make contact with the patient for follow up. The care management team is available to follow up with the patient after provider conversation with the patient regarding recommendation for care management engagement and subsequent re-referral to the care management team. If patient returns call to provider office, please advise to call [place] at [phone].  [place] Management  Direct Dial: [phone]

## 2021-07-08 ENCOUNTER — Other Ambulatory Visit: Payer: Self-pay | Admitting: Family Medicine

## 2021-07-23 ENCOUNTER — Ambulatory Visit: Payer: Medicare Other | Admitting: Family Medicine

## 2021-08-03 ENCOUNTER — Other Ambulatory Visit: Payer: Self-pay | Admitting: Family Medicine

## 2021-08-11 ENCOUNTER — Other Ambulatory Visit: Payer: Self-pay

## 2021-08-11 ENCOUNTER — Ambulatory Visit (INDEPENDENT_AMBULATORY_CARE_PROVIDER_SITE_OTHER): Payer: Medicare Other | Admitting: Student

## 2021-08-11 ENCOUNTER — Other Ambulatory Visit: Payer: Self-pay | Admitting: Family Medicine

## 2021-08-11 ENCOUNTER — Encounter: Payer: Self-pay | Admitting: Family Medicine

## 2021-08-11 VITALS — BP 145/73 | HR 88 | Ht 67.0 in | Wt 206.5 lb

## 2021-08-11 DIAGNOSIS — E1122 Type 2 diabetes mellitus with diabetic chronic kidney disease: Secondary | ICD-10-CM | POA: Diagnosis not present

## 2021-08-11 DIAGNOSIS — N186 End stage renal disease: Secondary | ICD-10-CM

## 2021-08-11 DIAGNOSIS — I159 Secondary hypertension, unspecified: Secondary | ICD-10-CM | POA: Diagnosis not present

## 2021-08-11 DIAGNOSIS — E785 Hyperlipidemia, unspecified: Secondary | ICD-10-CM

## 2021-08-11 DIAGNOSIS — Z794 Long term (current) use of insulin: Secondary | ICD-10-CM

## 2021-08-11 DIAGNOSIS — Z23 Encounter for immunization: Secondary | ICD-10-CM

## 2021-08-11 DIAGNOSIS — Z0000 Encounter for general adult medical examination without abnormal findings: Secondary | ICD-10-CM | POA: Insufficient documentation

## 2021-08-11 DIAGNOSIS — Z992 Dependence on renal dialysis: Secondary | ICD-10-CM

## 2021-08-11 NOTE — Assessment & Plan Note (Addendum)
Flu vaccine and Cologuard today.  Patient to return for fourth COVID and pneumococcal vaccine in addition to Pap smear.  Advised shingle vaccine unable to be given here but she can receive this at local pharmacy.

## 2021-08-11 NOTE — Patient Instructions (Addendum)
It was great to see you today! Thank you for choosing [facility] for your primary care. [name] was seen for adult well visit.  Our plans for today were:  - Flu vaccine - Cologuard set up - Lipid panel - HepC Screen - Hgb A1c check - Comprehensive metabolic panel  Please also follow-up with chronic care management [place] at [phone].  We are checking some labs today. If they are abnormal, I will call you. If they are normal, I will send you a MyChart message or a letter. If you do not hear about your labs in the next 2 weeks, please let us know.  To keep you healthy, we need to monitor some screening tests. You are due for pneumococcal vaccine obtain this at your next visit in addition to Pap smear.  Unfortunately shingles vaccine is not offered here and he will need to get these at a pharmacy.  We also discussed your blood pressure.  After you have gotten your blood pressure log, please take your blood pressure once a day after dinner when you are in a relaxed state and write down the number.  Please bring this log to your next visit so that we may adjust her blood pressure medications accordingly.  You should return to our clinic in 4 weeks for the above.   I recommend that you always bring your medications to each appointment as this makes it easy to ensure you are on the correct medications and helps [place] not miss refills when you need them.  Take care and seek immediate care sooner if you develop any concerns.   Thank you for allowing me to participate in your care, [name], DO 08/11/2021, 10:45 AM PGY-1, [place]

## 2021-08-11 NOTE — Assessment & Plan Note (Signed)
Blood pressure today 145/73.  Patient is on amlodipine 10 mg.  States losartan was discontinued a while back and she has not had this refilled.  She just received a blood pressure machine.  Advised patient to check blood pressure once a day after dinner when she is relaxed and write and daily log.  Advised patient to bring log to next visit.  Patient amenable to plan.

## 2021-08-11 NOTE — Progress Notes (Signed)
Subjective:    Patient ID: [name], female    DOB: Apr 11, 1966, 55 y.o.   MRN: [id number]   CC: Adult wellness visit  HPI:  [name] is a 55 year old female with past medical history T2DM, ESRD on HD, HTN, HLD.  She presents today for wellness visit without specific concerns.  At her last wellness visit she was unable to obtain blood work and is willing to receive those today.  She is also taking flu vaccine and Cologuard.  She has not had a history of previous colonoscopies but knew that she was due and this has been difficult to schedule given her regular hemodialysis appointments.  She is also seeking fourth COVID-vaccine and due for Pap smear.  She is amenable to following up in 4 weeks for COVID fourth COVID-vaccine, Pap smear and other wellness concerns.  Signout: Chronic care management has been attempting to follow-up with her regarding blood pressure and diabetes.  Patient states that her phone broke and she will give them a call note that she has learned that she missed phone calls from them.   ROS: pertinent noted in the HPI   Past Medical History:  Diagnosis Date   Alcohol dependency ([place])    Anemia    Appendicitis 12/06/2018   Chronic kidney disease    Diabetes mellitus    type 2   Diabetes mellitus type 2, uncontrolled ([place])    End stage renal disease ([name]) 05/05/2016   Starting 04/2016, LUE fistula   Enterococcal bacteremia    Hypertension    Legally blind in left eye, as defined in [place]    Liver abscess 12/20/2018   Necrotizing fasciitis ([name])    PONV (postoperative nausea and vomiting)    Sepsis without acute organ dysfunction ([name])    Type 2 diabetes mellitus with diabetic chronic kidney disease ([name]) 05/05/2016    Past Surgical History:  Procedure Laterality Date   AMPUTATION     right first and left middle finger amputation 2/2 OM 08/2011 by Dr. Amedeo Plenty   AMPUTATION  01/18/2012   Procedure: AMPUTATION DIGIT;  Surgeon: Tennis Must, MD;  Location:  [place];  Service: Orthopedics;  Laterality: Left;  revision amputation left thumb   AMPUTATION  08/25/2012   Procedure: AMPUTATION BELOW KNEE;  Surgeon: Newt Minion, MD;  Location: [name];  Service: Orthopedics;  Laterality: Right;  Right Below Knee Amputation   APPENDECTOMY     BASCILIC VEIN TRANSPOSITION Left 07/22/2015   Procedure: BASILIC VEIN TRANSPOSITION ;  Surgeon: Elam Dutch, MD;  Location: [name];  Service: Vascular;  Laterality: Left;   CATARACT EXTRACTION     right    [place]   I & D EXTREMITY  12/24/2011   Procedure: IRRIGATION AND DEBRIDEMENT EXTREMITY;  Surgeon: Tennis Must, MD;  Location: [name];  Service: Orthopedics;  Laterality: Left;  I&Dleft thumb with partial amputation   I & D EXTREMITY  12/26/2011   Procedure: IRRIGATION AND DEBRIDEMENT EXTREMITY;  Surgeon: Tennis Must, MD;  Location: [name];  Service: Orthopedics;  Laterality: Left;   I & D EXTREMITY  12/23/2011   Procedure: IRRIGATION AND DEBRIDEMENT EXTREMITY;  Surgeon: Tennis Must, MD;  Location: [name];  Service: Orthopedics;  Laterality: Left;  I&D Left thumb, hand and arm   I&D left thumb  12/23/2011   IR RADIOLOGIST EVAL & MGMT  01/04/2019   IR RADIOLOGIST EVAL & MGMT  01/25/2019   LAPAROSCOPIC APPENDECTOMY N/A 12/06/2018   Procedure: APPENDECTOMY LAPAROSCOPIC;  Surgeon: Kinsinger, Arta Bruce, MD;  Location: [name];  Service: General;  Laterality: N/A;   TUBAL LIGATION      Past medical history, surgical, family, and social history reviewed and updated in the EMR as appropriate.  Objective:  BP (!) 145/73   Pulse 88   Ht '5\' 7"'$  (1.702 m)   Wt 206 lb 8 oz (93.7 kg)   LMP 03/07/2019   SpO2 100%   BMI 32.34 kg/m   Vitals and nursing note reviewed  General: NAD, [name], able to participate in exam Cardiac: RRR, 3/6 systolic murmur, previously noted,  Respiratory: CTAB, normal effort, No wheezes, rales or  rhonchi Abdomen: Soft, nondistended, normoactive bowel sounds Extremities: Bounding left upper extremity graft, no concerns of patency Skin: warm and dry, no rashes noted Neuro: alert, no obvious focal deficits, conversationally appropriate Psych: Normal affect and mood   Assessment & Plan:  Type 2 diabetes mellitus ([name]) Patient on HD.  Per patient, BG's range between 120s to 160s.  She is currently on Lantus 8 units.  CCM is attempted to follow but unable to reach her.  CCM number for Erline Levine given to patient with AVS. Will obtain A1c this visit.  Secondary hypertension, unspecified Blood pressure today 145/73.  Patient is on amlodipine 10 mg.  States losartan was discontinued a while back and she has not had this refilled.  She just received a blood pressure machine.  Advised patient to check blood pressure once a day after dinner when she is relaxed and write and daily log.  Advised patient to bring log to next visit.  Patient amenable to plan.  Hyperlipidemia Obtain lipid panel today and CMP as these were not able to be drawn at last visit.  Currently on atorvastatin 40 mg daily.  Encounter for well adult exam without abnormal findings Flu vaccine and Cologuard today.  Patient to return for fourth COVID and pneumococcal vaccine in addition to Pap smear.  Advised shingle vaccine unable to be given here but she can receive this at local pharmacy.  Return in about 4 weeks (around 09/08/2021) for vaccines and Pap smear.   [name], [place]

## 2021-08-11 NOTE — Assessment & Plan Note (Signed)
Obtain lipid panel today and CMP as these were not able to be drawn at last visit.  Currently on atorvastatin 40 mg daily.

## 2021-08-11 NOTE — Assessment & Plan Note (Addendum)
Patient on HD.  Per patient, BG's range between 120s to 160s.  She is currently on Lantus 8 units.  CCM is attempted to follow but unable to reach her.  CCM number for [name] given to patient with AVS. Will obtain A1c this visit.

## 2021-09-01 ENCOUNTER — Other Ambulatory Visit: Payer: Self-pay

## 2021-09-01 ENCOUNTER — Other Ambulatory Visit: Payer: Medicare Other

## 2021-09-01 DIAGNOSIS — E785 Hyperlipidemia, unspecified: Secondary | ICD-10-CM

## 2021-09-01 DIAGNOSIS — Z0000 Encounter for general adult medical examination without abnormal findings: Secondary | ICD-10-CM

## 2021-09-03 ENCOUNTER — Other Ambulatory Visit: Payer: Self-pay | Admitting: Family Medicine

## 2021-09-04 ENCOUNTER — Telehealth: Payer: Self-pay | Admitting: Student

## 2021-09-04 NOTE — Telephone Encounter (Signed)
Called pt to discuss that her kidney labs are still abnormal as expected considering she is dependent on hemodialysis. Otherwise, no further concerns to address. Pt understood and did not have any other questions.

## 2021-09-08 LAB — LIPID PANEL
Chol/HDL Ratio: 2.4 ratio (ref 0.0–4.4)
Cholesterol, Total: 153 mg/dL (ref 100–199)
HDL: 65 mg/dL (ref >39)
LDL Chol Calc (NIH): 69 mg/dL (ref 0–99)
Triglycerides: 103 mg/dL (ref 0–149)
VLDL Cholesterol Cal: 19 mg/dL (ref 5–40)

## 2021-09-08 LAB — COMPREHENSIVE METABOLIC PANEL
ALT: 7 IU/L (ref 0–32)
AST: 15 IU/L (ref 0–40)
Albumin/Globulin Ratio: 1.6 (ref 1.2–2.2)
Albumin: 4.6 g/dL (ref 3.8–4.9)
Alkaline Phosphatase: 86 IU/L (ref 44–121)
BUN/Creatinine Ratio: 7 — ABNORMAL LOW (ref 9–23)
BUN: 60 mg/dL — ABNORMAL HIGH (ref 6–24)
Bilirubin Total: 0.3 mg/dL (ref 0.0–1.2)
CO2: 18 mmol/L — ABNORMAL LOW (ref 20–29)
Calcium: 9.3 mg/dL (ref 8.7–10.2)
Chloride: 93 mmol/L — ABNORMAL LOW (ref 96–106)
Creatinine, Ser: 9.05 mg/dL — ABNORMAL HIGH (ref 0.57–1.00)
Globulin, Total: 2.9 g/dL (ref 1.5–4.5)
Glucose: 177 mg/dL — ABNORMAL HIGH (ref 70–99)
Potassium: 4.5 mmol/L (ref 3.5–5.2)
Sodium: 141 mmol/L (ref 134–144)
Total Protein: 7.5 g/dL (ref 6.0–8.5)
eGFR: 5 mL/min/{1.73_m2} — ABNORMAL LOW (ref >59)

## 2021-09-08 LAB — HCV INTERPRETATION

## 2021-09-08 LAB — HCV AB W REFLEX TO QUANT PCR: HCV Ab: <0.1 s/co ratio (ref 0.0–0.9)

## 2021-09-29 ENCOUNTER — Ambulatory Visit: Payer: Medicare Other | Admitting: Family Medicine

## 2021-10-15 ENCOUNTER — Ambulatory Visit: Payer: Medicare Other | Admitting: Family Medicine

## 2021-10-28 ENCOUNTER — Ambulatory Visit: Payer: Medicare Other | Admitting: Family Medicine

## 2021-11-03 ENCOUNTER — Ambulatory Visit: Payer: Medicare Other | Admitting: Family Medicine

## 2021-12-11 ENCOUNTER — Other Ambulatory Visit: Payer: Self-pay | Admitting: Family Medicine

## 2021-12-11 DIAGNOSIS — Z794 Long term (current) use of insulin: Secondary | ICD-10-CM

## 2021-12-11 DIAGNOSIS — E1139 Type 2 diabetes mellitus with other diabetic ophthalmic complication: Secondary | ICD-10-CM

## 2022-01-04 ENCOUNTER — Other Ambulatory Visit: Payer: Self-pay | Admitting: Family Medicine

## 2022-01-04 DIAGNOSIS — E1142 Type 2 diabetes mellitus with diabetic polyneuropathy: Secondary | ICD-10-CM

## 2022-01-04 NOTE — Telephone Encounter (Signed)
Received refill request of gabapentin. Patient needs follow up to assess renal function. Last s cr 9 in September 2022. Patient made appt for 01/12/22 will obtain BMP and check in on DM2.  [name], MD [facility], PGY-3

## 2022-01-11 NOTE — Progress Notes (Signed)
° ° °  SUBJECTIVE:   CHIEF COMPLAINT / HPI:   DM2: Last a1c 6.9% in February 2022. Will recheck today. Current regiment is lantus 12U BID.  Diabetic neuropathy. Patient has been on gabapentin 100 mg TID for many years. Will recheck labs today.  HLD: Patient on HD, has DM2, will check labs today  HC maintenance: discussed pap smear and colonoscopy with patient. She prefers to do fit test screening every year given transportation difficulties and HD. She agrees to follow up next week for pap smear.  HTN: BP not at goal: 150/62. Current regimen is amlodipine 10 mg. She cannot recall all her medications, she is seen by [facility].  Chronic Cough: patient reports many years of cough that is most bothersome when laying down at night, but can happen during the day. She denies any history of smoking. She denies fever, chills, SOB, CP, wheezing, n/v/d. Has not tried anything for the cough in the past.  PERTINENT  PMH / PSH:  DM2, ESRD on HD M/W/F  OBJECTIVE:   BP (!) 150/62    Pulse 90    Wt 205 lb (93 kg)    LMP 03/07/2019    SpO2 100%    BMI 32.11 kg/m   Nursing note and vitals reviewed GEN: age-appropriate, AAW, resting comfortably in chair, NAD, class I obesity, in wheelchair HEENT: NCAT. PERRLA. Sclera without injection or icterus. MMM. Nasal passages erythematous and edematous Neck: Supple. No LAD Cardiac: Regular rate and rhythm. Normal S1/S2. No murmurs, rubs, or gallops appreciated. 2+ radial pulses. Lungs: Clear bilaterally to ascultation. No increased WOB, no accessory muscle usage. No w/r/r. Neuro: AOx3  Ext: no edema, BKA on R leg, 2nd digits amputated on each hand Psych: Pleasant and appropriate   ASSESSMENT/PLAN:   Type 2 diabetes mellitus (HCC) Lantus 8U, BGs most recently 120s-150s, appropriate. Overall well controlled.  A1c is 6%, however patient is on HD so likely not accurate.  Secondary hypertension, unspecified Not controlled. Will discuss with nephrology team what  medications patient is on, she does not recall other medications.   Hyperlipidemia Unable to obtain blood work for [employer] and lipid panel today. Patient to return next Tuesday, hydrate well before visit for lab draw attempt.  Healthcare maintenance Scheduled appt for next week for pap smear. Will give patient FIT test kit.  Chronic cough Patient has long history of cough, no history of smoking, not on ACE-I. She does have inflamed nasal sinus passages, she is on PPI (GERD is consideration as well). Will trial flonase and revisit. Follow up if no improvement.     [name], MD [place]

## 2022-01-12 ENCOUNTER — Ambulatory Visit (INDEPENDENT_AMBULATORY_CARE_PROVIDER_SITE_OTHER): Payer: Medicare Other | Admitting: Family Medicine

## 2022-01-12 ENCOUNTER — Other Ambulatory Visit: Payer: Self-pay

## 2022-01-12 VITALS — BP 150/62 | HR 90 | Wt 205.0 lb

## 2022-01-12 DIAGNOSIS — Z1211 Encounter for screening for malignant neoplasm of colon: Secondary | ICD-10-CM

## 2022-01-12 DIAGNOSIS — N186 End stage renal disease: Secondary | ICD-10-CM | POA: Diagnosis not present

## 2022-01-12 DIAGNOSIS — R0981 Nasal congestion: Secondary | ICD-10-CM

## 2022-01-12 DIAGNOSIS — Z0000 Encounter for general adult medical examination without abnormal findings: Secondary | ICD-10-CM | POA: Diagnosis not present

## 2022-01-12 DIAGNOSIS — D631 Anemia in chronic kidney disease: Secondary | ICD-10-CM

## 2022-01-12 DIAGNOSIS — I159 Secondary hypertension, unspecified: Secondary | ICD-10-CM | POA: Diagnosis not present

## 2022-01-12 DIAGNOSIS — E785 Hyperlipidemia, unspecified: Secondary | ICD-10-CM | POA: Diagnosis not present

## 2022-01-12 DIAGNOSIS — Z794 Long term (current) use of insulin: Secondary | ICD-10-CM | POA: Diagnosis not present

## 2022-01-12 DIAGNOSIS — E1122 Type 2 diabetes mellitus with diabetic chronic kidney disease: Secondary | ICD-10-CM

## 2022-01-12 DIAGNOSIS — Z992 Dependence on renal dialysis: Secondary | ICD-10-CM

## 2022-01-12 DIAGNOSIS — R053 Chronic cough: Secondary | ICD-10-CM

## 2022-01-12 LAB — POCT GLYCOSYLATED HEMOGLOBIN (HGB A1C): HbA1c, POC (controlled diabetic range): 6 % (ref 0.0–7.0)

## 2022-01-12 MED ORDER — FLUTICASONE PROPIONATE 50 MCG/ACT NA SUSP: 2.0000 | Freq: Every day | NASAL | 6 refills | Status: DC

## 2022-01-12 NOTE — Assessment & Plan Note (Signed)
Patient has long history of cough, no history of smoking, not on ACE-I. She does have inflamed nasal sinus passages, she is on PPI (GERD is consideration as well). Will trial flonase and revisit. Follow up if no improvement.

## 2022-01-12 NOTE — Assessment & Plan Note (Signed)
Lantus 8U, BGs most recently 120s-150s, appropriate. Overall well controlled.  A1c is 6%, however patient is on HD so likely not accurate.

## 2022-01-12 NOTE — Patient Instructions (Signed)
It was a pleasure to see you today!  Bring back your FIT test for your appt next Tuesday Drink plenty of water after your dialysis session next Monday so we can get blood work on Tuesday Plan for a pap smear next Tuesday   Be Well,  Dr. Chauncey Reading

## 2022-01-12 NOTE — Assessment & Plan Note (Signed)
Not controlled. Will discuss with nephrology team what medications patient is on, she does not recall other medications.

## 2022-01-12 NOTE — Assessment & Plan Note (Signed)
Unable to obtain blood work for [employer] and lipid panel today. Patient to return next Tuesday, hydrate well before visit for lab draw attempt.

## 2022-01-12 NOTE — Assessment & Plan Note (Signed)
Scheduled appt for next week for pap smear. Will give patient FIT test kit. °

## 2022-01-19 ENCOUNTER — Other Ambulatory Visit: Payer: Self-pay

## 2022-01-19 ENCOUNTER — Ambulatory Visit (INDEPENDENT_AMBULATORY_CARE_PROVIDER_SITE_OTHER): Payer: Medicare Other | Admitting: Family Medicine

## 2022-01-19 ENCOUNTER — Other Ambulatory Visit (HOSPITAL_COMMUNITY)
Admission: RE | Admit: 2022-01-19 | Discharge: 2022-01-19 | Disposition: A | Discharge status: Home / Self Care | Payer: Medicare Other | Source: Ambulatory Visit | Attending: Family Medicine | Admitting: Family Medicine

## 2022-01-19 VITALS — BP 164/76 | HR 91 | Ht 67.0 in

## 2022-01-19 DIAGNOSIS — Z124 Encounter for screening for malignant neoplasm of cervix: Secondary | ICD-10-CM | POA: Insufficient documentation

## 2022-01-19 DIAGNOSIS — Z114 Encounter for screening for human immunodeficiency virus [HIV]: Secondary | ICD-10-CM

## 2022-01-19 DIAGNOSIS — I159 Secondary hypertension, unspecified: Secondary | ICD-10-CM | POA: Diagnosis not present

## 2022-01-19 DIAGNOSIS — Z1151 Encounter for screening for human papillomavirus (HPV): Secondary | ICD-10-CM | POA: Diagnosis not present

## 2022-01-19 DIAGNOSIS — Z794 Long term (current) use of insulin: Secondary | ICD-10-CM

## 2022-01-19 DIAGNOSIS — Z01419 Encounter for gynecological examination (general) (routine) without abnormal findings: Secondary | ICD-10-CM | POA: Insufficient documentation

## 2022-01-19 DIAGNOSIS — N898 Other specified noninflammatory disorders of vagina: Secondary | ICD-10-CM

## 2022-01-19 DIAGNOSIS — Z0000 Encounter for general adult medical examination without abnormal findings: Secondary | ICD-10-CM

## 2022-01-19 DIAGNOSIS — E1122 Type 2 diabetes mellitus with diabetic chronic kidney disease: Secondary | ICD-10-CM

## 2022-01-19 DIAGNOSIS — Z113 Encounter for screening for infections with a predominantly sexual mode of transmission: Secondary | ICD-10-CM

## 2022-01-19 DIAGNOSIS — D631 Anemia in chronic kidney disease: Secondary | ICD-10-CM

## 2022-01-19 DIAGNOSIS — R0981 Nasal congestion: Secondary | ICD-10-CM

## 2022-01-19 DIAGNOSIS — N186 End stage renal disease: Secondary | ICD-10-CM

## 2022-01-19 DIAGNOSIS — Z1211 Encounter for screening for malignant neoplasm of colon: Secondary | ICD-10-CM

## 2022-01-19 DIAGNOSIS — Z992 Dependence on renal dialysis: Secondary | ICD-10-CM

## 2022-01-19 DIAGNOSIS — E785 Hyperlipidemia, unspecified: Secondary | ICD-10-CM | POA: Diagnosis not present

## 2022-01-19 MED ORDER — FLUTICASONE PROPIONATE 50 MCG/ACT NA SUSP: 2.0000 | Freq: Every day | NASAL | 6 refills | Status: DC

## 2022-01-19 NOTE — Assessment & Plan Note (Signed)
FIT test given. Pap smear completed.

## 2022-01-19 NOTE — Progress Notes (Signed)
° ° °  SUBJECTIVE:   CHIEF COMPLAINT / HPI:   HLD: Will attempt to obtain CMP and lipid panel today to assess. Patient is ESRD on HD, difficult to get labs.  HTN: Will attempt to get blood work today to assess, see above.  HC maintenance: will do pap smear today, will do routine STI testing. After counseling, patient opts for FIT test, she notes   DM2: counseled on annual ophthalmology exam for diabetes, patient will make appt. She does not need UACR as she has ESRD and is on HD. Goal is to decrease risk of cardiovascular death, see above for labs for HTN/HLD.  PERTINENT  PMH / PSH: DM2, HTN, HLD, ESRD on HD  OBJECTIVE:   BP (!) 164/76    Pulse 91    Ht 5\' 7"  (1.702 m)    LMP 03/07/2019    SpO2 100%    BMI 32.11 kg/m   Nursing note and vitals reviewed GEN: Age-appropriate AAW, resting comfortably in chair, NAD, class I obesity Cardiac: Regular rate and rhythm. Normal S1/S2. No murmurs, rubs, or gallops appreciated. 2+ radial pulses. Lungs: Clear bilaterally to ascultation. No increased WOB, no accessory muscle usage. No w/r/r. PELVIC:  Normal appearing external female genitalia, normal vaginal epithelium, no abnormal discharge. Normal appearing cervix. Neuro: AOx3  Ext: no edema Psych: Pleasant and appropriate   ASSESSMENT/PLAN:   Hyperlipidemia Was able to obtain lipid panel today, not able to get other labs, will request labs from nephrology.  Secondary hypertension, unspecified Unable to get labs, will request recent labs from nephrology.  Healthcare maintenance FIT test given. Pap smear completed.     [name], MD [place]

## 2022-01-19 NOTE — Patient Instructions (Addendum)
It was a pleasure to see you today!  We will get some labs today.  If they are abnormal or we need to do something about them, I will call you.  If they are normal, I will send you a message on MyChart (if it is active) or a letter in the mail.  If you don't hear from [place] in 2 weeks, please call the office  [phone]. Please return your fit test in 2 weeks If we cannot get blood work today, I will ask your nephrologist to get it at your next dialysis session. Please get an eye exam with an ophthalmologist this year to look for diabetic complications with your eyes, have them send a report to my office Follow up in 3 months for diabetes check Consider getting the vaccine for shingles. This helps prevent painful complications from shingles. You can get it done at any pharmacy.    Be Well,  Dr. Chauncey Reading

## 2022-01-19 NOTE — Assessment & Plan Note (Signed)
Unable to get labs, will request recent labs from nephrology.

## 2022-01-19 NOTE — Assessment & Plan Note (Signed)
Was able to obtain lipid panel today, not able to get other labs, will request labs from nephrology.

## 2022-01-20 ENCOUNTER — Telehealth: Payer: Self-pay | Admitting: Family Medicine

## 2022-01-20 DIAGNOSIS — E1122 Type 2 diabetes mellitus with diabetic chronic kidney disease: Secondary | ICD-10-CM

## 2022-01-20 DIAGNOSIS — I159 Secondary hypertension, unspecified: Secondary | ICD-10-CM

## 2022-01-20 DIAGNOSIS — Z992 Dependence on renal dialysis: Secondary | ICD-10-CM

## 2022-01-20 LAB — LIPID PANEL
Chol/HDL Ratio: 2.4 ratio (ref 0.0–4.4)
Cholesterol, Total: 116 mg/dL (ref 100–199)
HDL: 49 mg/dL (ref >39)
LDL Chol Calc (NIH): 49 mg/dL (ref 0–99)
Triglycerides: 94 mg/dL (ref 0–149)
VLDL Cholesterol Cal: 18 mg/dL (ref 5–40)

## 2022-01-20 MED ORDER — LOSARTAN POTASSIUM 50 MG PO TABS: 50.0000 mg | ORAL_TABLET | Freq: Every day | ORAL | 3 refills | Status: DC

## 2022-01-20 NOTE — Telephone Encounter (Signed)
Patient with uncontrolled hypertension, currently only on amlodipine 10 mg, previously on losartan 50 mg with much better control. She previously had losartan held by nephrology, presumably for hypotension, about a year ago. With consistently elevated BP, I discussed with Ernest Haber, PA, who agreed we could trial restarting losartan 50 mg. Will inform patient and order.  [name], MD [facility], PGY-3

## 2022-01-21 LAB — CYTOLOGY - PAP
Adequacy: ABSENT
Chlamydia: NEGATIVE
Comment: NEGATIVE
Comment: NEGATIVE
Comment: NEGATIVE
Comment: NORMAL
Diagnosis: NEGATIVE
High risk HPV: NEGATIVE
Neisseria Gonorrhea: NEGATIVE
Trichomonas: NEGATIVE

## 2022-01-23 ENCOUNTER — Encounter: Payer: Self-pay | Admitting: Family Medicine

## 2022-01-23 NOTE — Progress Notes (Signed)
NILM, HPV neg, no GC, trich. Normal result letter sent. Next pap in 2028.  [name], MD [facility], PGY-3

## 2022-02-09 ENCOUNTER — Other Ambulatory Visit: Payer: Self-pay | Admitting: Family Medicine

## 2022-02-09 DIAGNOSIS — Z794 Long term (current) use of insulin: Secondary | ICD-10-CM

## 2022-02-09 DIAGNOSIS — E1142 Type 2 diabetes mellitus with diabetic polyneuropathy: Secondary | ICD-10-CM

## 2022-02-12 ENCOUNTER — Telehealth: Payer: Self-pay

## 2022-02-12 NOTE — Telephone Encounter (Signed)
Patient calls nurse line regarding elevated BP when going in for dialysis. Patient reports that this has been happening for the last four treatments she has received.  ? ?States that systolic BP has been running between 170's and 210's. Last systolic reading was yesterday and was 199. Patient was unable to provide diastolic readings.  ? ?Patient was asymptomatic at time of readings and continues to be asymptomatic at this time.  ? ?Advised patient to schedule follow up appointment for further management of BP. Patient scheduled for Thursday 3/16 with Dr. Zigmund Daniel. (Patient could not come in earlier due to dialysis on M,W,F and transportation) ? ?Recommended checking BP daily, however, patient does not have home BP cuff. Provided with strict ED precautions.  ? ?Talbot Grumbling, RN ? ?

## 2022-02-18 ENCOUNTER — Ambulatory Visit: Payer: Medicare Other | Admitting: Student

## 2022-02-18 NOTE — Progress Notes (Deleted)
? ? ?  SUBJECTIVE:  ? ?CHIEF COMPLAINT / HPI:  ? ?HTN: While going to dialysis, patient has systolic BP in [id number]. ? ?Hypertension, follow-up ? ?BP Readings from Last 3 Encounters:  ?01/19/22 (!) 164/76  ?01/12/22 (!) 150/62  ?08/11/21 (!) 145/73  ? Wt Readings from Last 3 Encounters:  ?01/12/22 205 lb (93 kg)  ?08/11/21 206 lb 8 oz (93.7 kg)  ?01/13/21 211 lb 3.2 oz (95.8 kg)  ?  ? ?She was last seen for hypertension {NUMBERS 1-12:18279} {days/wks/mos/yrs:310907} ago.  ?BP at that visit was ***. Management since that visit includes ***. ? ?She reports {excellent/good/fair/poor:19665} compliance with treatment. ?She {is/is not:9024} having side effects. {document side effects if present:1} ?She is following a {diet:21022986} diet. ?She {is/is not:9024} exercising. ?She {does/does not:200015} smoke. ? ?Use of agents associated with hypertension: {bp agents assoc with hypertension:511::"none"}.  ? ?Outside blood pressures are {***enter patient reported home BP readings, or 'not being checked':1}. ?Symptoms: ?{Yes/No:20286} chest pain {Yes/No:20286} chest pressure  ?{Yes/No:20286} palpitations {Yes/No:20286} syncope  ?{Yes/No:20286} dyspnea {Yes/No:20286} orthopnea  ?{Yes/No:20286} paroxysmal nocturnal dyspnea {Yes/No:20286} lower extremity edema  ? ?Pertinent labs: ?Lab Results  ?Component Value Date  ? CHOL 116 01/19/2022  ? HDL 49 01/19/2022  ? LDLCALC 49 01/19/2022  ? TRIG 94 01/19/2022  ? CHOLHDL 2.4 01/19/2022  ? Lab Results  ?Component Value Date  ? NA 141 09/01/2021  ? K 4.5 09/01/2021  ? CREATININE 9.05 (H) 09/01/2021  ? EGFR 5 (L) 09/01/2021  ? GLUCOSE 177 (H) 09/01/2021  ? TSH 5.685 (H) 07/14/2015  ?  ? ?The ASCVD Risk score (Arnett DK, et al., 2019) failed to calculate for the following reasons: ?  The valid total cholesterol range is 130 to 320 mg/dL  ? ?--------------------------------------------------------------------------------------------------- ? ? ?PERTINENT  PMH / PSH: *** ? ?OBJECTIVE:  ? ?LMP  03/07/2019   ?*** ? ?ASSESSMENT/PLAN:  ? ?No problem-specific Assessment & Plan notes found for this encounter. ?  ? ? ?Erskine Emery, MD ?[place]  ? ?

## 2022-02-24 NOTE — Progress Notes (Signed)
? ? ?  SUBJECTIVE:  ? ?CHIEF COMPLAINT / HPI: elevated BP  ? ?Secondary HTN in setting of ESRD  ?Patient reported to have elevated BP measurements while reporting to dialysis treatments.  ?HA is very mild, almost difficult to tell if she is having a HA. She denies any chest pain or abnormal sensations in her chest.  ?Legally blind, still able to see objects closely. She does not notice substantial vision changes.  ? ?PERTINENT  PMH / PSH:  ?ESRD ?DM ?Amputee  ? ?OBJECTIVE:  ? ?BP (!) 171/78   Pulse 90   Ht 5\' 7"  (1.702 m)   LMP 03/07/2019   SpO2 100%   BMI 32.11 kg/m?   ?Physical Exam ?Constitutional:   ?   General: She is not in acute distress. ?   Appearance: Normal appearance. She is not ill-appearing, toxic-appearing or diaphoretic.  ?Cardiovascular:  ?   Rate and Rhythm: Normal rate and regular rhythm.  ?   Heart sounds: Murmur heard.  ?   Comments: Palpable thrill in fistula  ?Pulmonary:  ?   Effort: Pulmonary effort is normal.  ?   Breath sounds: Normal breath sounds. No wheezing or rales.  ?Abdominal:  ?   General: Bowel sounds are normal.  ?   Tenderness: There is no abdominal tenderness.  ?Neurological:  ?   Mental Status: She is alert.  ? ? ? ?ASSESSMENT/PLAN:  ? ?Secondary hypertension, unspecified ?Will start hydralazine 10mg  TID  ?F/u in one week for potential medication adjustment and BP check  ?Reviewed issues to return to care such as worsening HA, unilateral weakness or chest pain, pt voiced understanding  ?  ? ? ?Eulis Foster, MD ?[place]  ?

## 2022-02-25 ENCOUNTER — Other Ambulatory Visit: Payer: Self-pay

## 2022-02-25 ENCOUNTER — Ambulatory Visit (INDEPENDENT_AMBULATORY_CARE_PROVIDER_SITE_OTHER): Payer: Medicare Other | Admitting: Family Medicine

## 2022-02-25 DIAGNOSIS — I159 Secondary hypertension, unspecified: Secondary | ICD-10-CM | POA: Diagnosis not present

## 2022-02-25 MED ORDER — HYDRALAZINE HCL 10 MG PO TABS: 10.0000 mg | ORAL_TABLET | Freq: Three times a day (TID) | ORAL | 1 refills | Status: DC

## 2022-02-25 MED ORDER — ADULT BLOOD PRESSURE CUFF LG KIT: 1.0000 | PACK | Freq: Every day | 0 refills | Status: DC

## 2022-02-25 NOTE — Assessment & Plan Note (Signed)
Will start hydralazine 10mg  TID  ?F/u in one week for potential medication adjustment and BP check  ?Reviewed issues to return to care such as worsening HA, unilateral weakness or chest pain, pt voiced understanding  ?

## 2022-02-25 NOTE — Patient Instructions (Signed)
Your blood pressure remains elevated today. ? ?I will start a new medication called hydralazine.  I am starting a low-dose at 10 mg 3 times daily. ? ?Please follow-up with our office in 1 week for blood pressure check and to make sure you are tolerating the medication. ? ?Please seek care immediately if you new severe headache, blood pressures consistently elevated above 190/100 or if you have one-sided weakness or numbness. ? ? ?

## 2022-03-29 ENCOUNTER — Other Ambulatory Visit: Payer: Self-pay | Admitting: Family Medicine

## 2022-03-29 DIAGNOSIS — E1139 Type 2 diabetes mellitus with other diabetic ophthalmic complication: Secondary | ICD-10-CM

## 2022-04-21 ENCOUNTER — Other Ambulatory Visit: Payer: Self-pay | Admitting: Family Medicine

## 2022-04-21 DIAGNOSIS — E1142 Type 2 diabetes mellitus with diabetic polyneuropathy: Secondary | ICD-10-CM

## 2022-04-27 ENCOUNTER — Ambulatory Visit (INDEPENDENT_AMBULATORY_CARE_PROVIDER_SITE_OTHER): Payer: Medicare Other | Admitting: Family Medicine

## 2022-04-27 ENCOUNTER — Encounter: Payer: Self-pay | Admitting: Family Medicine

## 2022-04-27 VITALS — BP 166/78 | HR 93 | Ht 67.0 in | Wt 196.6 lb

## 2022-04-27 DIAGNOSIS — I159 Secondary hypertension, unspecified: Secondary | ICD-10-CM | POA: Diagnosis not present

## 2022-04-27 DIAGNOSIS — K219 Gastro-esophageal reflux disease without esophagitis: Secondary | ICD-10-CM | POA: Diagnosis not present

## 2022-04-27 DIAGNOSIS — N186 End stage renal disease: Secondary | ICD-10-CM | POA: Diagnosis not present

## 2022-04-27 DIAGNOSIS — Z992 Dependence on renal dialysis: Secondary | ICD-10-CM

## 2022-04-27 DIAGNOSIS — Z794 Long term (current) use of insulin: Secondary | ICD-10-CM

## 2022-04-27 DIAGNOSIS — E1122 Type 2 diabetes mellitus with diabetic chronic kidney disease: Secondary | ICD-10-CM | POA: Diagnosis not present

## 2022-04-27 MED ORDER — OMEPRAZOLE 20 MG PO CPDR
20.0000 mg | DELAYED_RELEASE_CAPSULE | Freq: Every day | ORAL | 0 refills | Status: DC
Start: 2022-04-27 — End: 2022-05-25

## 2022-04-27 MED ORDER — OMEPRAZOLE 20 MG PO CPDR: 20.0000 mg | DELAYED_RELEASE_CAPSULE | Freq: Every day | ORAL | 0 refills | Status: DC

## 2022-04-27 MED ORDER — ADULT BLOOD PRESSURE CUFF LG KIT: 1.0000 | PACK | Freq: Every day | 0 refills | Status: AC

## 2022-04-27 NOTE — Progress Notes (Signed)
    SUBJECTIVE:   CHIEF COMPLAINT / HPI:   Hypertension: BP today is not at goal: 166/78. Current regimen is hyrdralazine 10 mg TID. Patient  is also supposed to be on amlodipine 10 mg qd and losartan 50 mg. However, she was not sure if she was supposed to take these all together after her last visit, so she stopped losartan and amlodipine, She does have some improvement with hydralazine, but usually forgets third dose. The hydralazine only works for a few hours. She sometimes gets HA due to high BP, she is asx today. She only has a wrist cuff BP at home and cannot use it due to her blindness. She will need nursing help to check her BP- placed SW referral.  DM2: Patient with ESRD on HD. Will attempt to get a fructosamine instead of A1c. She cannot take her BG on her glucometer due to her blindness, also she is missing her right index finger and left thumb. SW referral placed- will need assistance in getting [facility] for monitoring blood glucose.   GERD: patient has noticed in the last 3-4 weeks she has a Globus sensation with burping. This happens during dialysis, no dysphagia  PERTINENT  PMH / PSH: DM2, ESRD on HD, Hypertension, HLD, legally blind, s/p right BKA,   OBJECTIVE:   BP (!) 166/78   Pulse 93   Ht 5\' 7"  (1.702 m)   Wt 196 lb 9.6 oz (89.2 kg)   LMP 03/07/2019   SpO2 100%   BMI 30.79 kg/m   Nursing note and vitals reviewed GEN: age-appropriate, AAW, resting comfortably in chair, NAD, WNWD HEENT: NCAT. PERRLA. Sclera without injection or icterus. MMM.  Neck: Supple.  Cardiac: Regular rate and rhythm. Normal S1/S2. No murmurs, rubs, or gallops appreciated. 2+ radial pulses. Lungs: Clear bilaterally to ascultation. No increased WOB, no accessory muscle usage. No w/r/r. Ext: no peripheral edema, no injuries to foot, 2+ DP Neuro: AOx3  Psych: Pleasant and appropriate  ASSESSMENT/PLAN:   Type 2 diabetes mellitus ([place]) Will try to get fructosamine- difficult due to vasculopathy (ESRD  on HD). Patient needs assistance in home to check BG due to blindness and finger amputations.  Secondary hypertension, unspecified Clarified with patient to take amlodipine, losartan and hydralazine. Will message nephrology. Follow up in 3 weeks (as soon as schedule permits). BP cuff sent to [facility], she will need case mgmt (referral placed) and likely need HH to help with measuring BP.  GERD (gastroesophageal reflux disease) Likely GERD, will tx with omeprazole 20 mg qd x2 weeks. If not improved, consider referral to GI.     [name] Damme, MD [place]

## 2022-04-27 NOTE — Assessment & Plan Note (Signed)
Clarified with patient to take amlodipine, losartan and hydralazine. Will message nephrology. Follow up in 3 weeks (as soon as schedule permits). BP cuff sent to [facility], she will need case mgmt (referral placed) and likely need HH to help with measuring BP.

## 2022-04-27 NOTE — Assessment & Plan Note (Signed)
Likely GERD, will tx with omeprazole 20 mg qd x2 weeks. If not improved, consider referral to GI.

## 2022-04-27 NOTE — Patient Instructions (Addendum)
It was a pleasure to see you today!  Restart taking amlodipine 10 mg and losartan 50 mg. Continue to take hydralazine 10 mg three times per day I will send in a prescription for a blood pressure cuff and I will contact the Nurse Case manager and social work team to try to get someone to help you with taking your blood pressure and finger sticks for blood glucose I will reach out to your kidney doctor about your blood pressure If you are having pain with your legs, you can take gabapentin 100 mg up to 3 times p day For heart burn and burping: take omeprazole 20 mg once a day for 14 days. If no improvement, come back at follow up and we will consider referral to a gastroenterologist doctor (GI)  Be Well,  Dr. Chauncey Reading

## 2022-04-27 NOTE — Assessment & Plan Note (Signed)
Will try to get fructosamine- difficult due to vasculopathy (ESRD on HD). Patient needs assistance in home to check BG due to blindness and finger amputations.

## 2022-04-28 ENCOUNTER — Telehealth: Payer: Self-pay | Admitting: *Deleted

## 2022-04-28 NOTE — Chronic Care Management (AMB) (Signed)
  Care Management   Note  04/28/2022 Name: [name] MRN: [id number] DOB: 22-Dec-1965  [name] is a 56 y.o. year old female who is a primary care patient of [name], MD. I reached out to [name] by phone today offer care coordination services.   [name] was given information about care management services today including:  Care management services include personalized support from designated clinical staff supervised by her physician, including individualized plan of care and coordination with other care providers 24/7 contact phone numbers for assistance for urgent and routine care needs. The patient may stop care management services at any time by phone call to the office staff.  Patient agreed to services and verbal consent obtained.   Follow up plan: Telephone appointment with care management team member scheduled for:04/29/22 with RNCM and 05/19/22 with [facility]  Direct Dial: [phone]

## 2022-04-29 ENCOUNTER — Ambulatory Visit: Payer: Medicare Other

## 2022-04-30 NOTE — Patient Instructions (Signed)
[name]  it was nice speaking with you. Please call me directly [phone] if you have questions about the goals we discussed.  Patient Care Plan: RN Case Manager     Problem Identified: Glycemic Management and Hypertension      Long-Range Goal: Glycemic Management and Hypertention   Start Date: 06/24/2020  Expected End Date: 10/05/2021  Recent Progress: On track  Priority: High  Note:     Current Barriers:  Knowledge Deficits related to basic Diabetes pathophysiology and self care/management Knowledge Deficits related to basic Hypertension pathophysiology and self care/management.  Case Manager Clinical Goal(s):  Over the next 90 days, patient will demonstrate improved adherence to prescribed treatment plan for hypertension as evidenced by taking all medications as prescribed, monitoring and recording blood pressure as directed, adhering to low sodium/DASH diet Over the next 90 days, patient will demonstrate improved adherence to prescribed treatment plan for diabetes self care/management as evidenced by: maintaining her blood sugars in the range on 125-135   Interventions: 1:1 collaboration with primary care provider regarding development and update of comprehensive plan of care as evidenced by provider attestation and co-signature Inter-disciplinary care team collaboration (see longitudinal plan of care) Evaluation of current treatment plan related to  self management and patient's adherence to plan as established by provider   Diabetes:  (Status: New goal.) Long Term Goal   Lab Results  Component Value Date   HGBA1C 6.0 01/12/2022   @ Assessed patient's understanding of A1c goal: <7% Provided education to patient about basic DM disease process; Reviewed medications with patient and discussed importance of medication adherence;        Reviewed prescribed diet with patient  Discussed plans with patient for ongoing care management follow up and provided patient with  direct contact information for care management team;       Hypertension: (Status: New goal.) Long Term Goal  Last practice recorded BP readings:  BP Readings from Last 3 Encounters:  04/27/22 (!) 166/78  02/25/22 (!) 171/78  01/19/22 (!) 164/76  Most recent eGFR/CrCl:  Lab Results  Component Value Date   EGFR 5 (L) 09/01/2021    No components found for: CRCL  Evaluation of current treatment plan related to hypertension self management and patient's adherence to plan as established by provider;   Reviewed prescribed diet  Reviewed medications with patient and discussed importance of compliance;  Discussed plans with patient for ongoing care management follow up and provided patient with direct contact information for care management team; Advised patient, providing education and rationale, to monitor blood pressure daily and record, calling PCP for findings outside established parameters;  04/29/22:  Today, I spoke to Mrs. Rajkumar about our care coordination program and my colleagues' role. She expressed interest in participating since she lives with her boyfriend and can do daily tasks. However, she needs help checking her blood sugar and blood pressure and cleaning once she moves into her apartment from the hotel. She has a glucometer but needs a new blood pressure monitor, which I recommended from [employer] since her insurance covers it. The pharmacy will deliver it to This address because she did not want it delivered to the hotel, scared it would get stolen. The address given was [address], [place]. Her physician had already sent a prescription previously to the pharmacy, so we called them on a conference call, and they confirmed they had the prescription and would deliver it. Wyatt Portela, the Education officer, museum, will call her on 6/14 as well.  Patient Goals/Self Care Activities: -Patient/Caregiver will self-administer medications as prescribed as evidenced by self-report/primary  caregiver report  -Patient/Caregiver will attend all scheduled provider appointments as evidenced by clinician review of documented attendance to scheduled appointments and patient/caregiver report -Patient/Caregiver will call pharmacy for medication refills as evidenced by patient report and review of pharmacy fill history as appropriate -Patient/Caregiver will call provider office for new concerns or questions as evidenced by review of documented incoming telephone call notes and patient report -Patient/Caregiver verbalizes understanding of plan -Patient/Caregiver will focus on medication adherence by taking medications as prescribed  -Calls provider office for new concerns, questions, or BP outside discussed parameters -Checks BP and records as discussed -Follows a low sodium diet/DASH diet -check blood sugar at prescribed times -check blood sugar if I feel it is too high or too low -record values and write them down take them to all doctor visits           [name] received Care Management services today:  Care Management services include personalized support from designated clinical staff supervised by her physician, including individualized plan of care and coordination with other care providers 24/7 contact [phone] for assistance for urgent and routine care needs. Care Management are voluntary services and be declined at any time by calling the office.  The patient verbalized understanding of instructions provided today and declined a print copy of patient instruction materials.    Follow Up Plan: Patient would like continued follow-up.  CCM RNCM will outreach the patient within the next 2 weeks.  Patient will call office if needed prior to next encounter   Lazaro Arms, RN   [phone]

## 2022-04-30 NOTE — Chronic Care Management (AMB) (Signed)
RNCM Care Management Note 04/30/2022 Name: [name] MRN: [id number] DOB: [birth year]   [name] is a 56 y.o. year old female who sees [name], MD for primary care. RNCM was consulted to assistance patient with  Care Coordination.      Engaged with patient by telephone for initial visit in response to provider referral for   Summary: Patient is making progress with her chronic conditions , but currently is experiencing difficulty with elevated blood pressure.  She will be receiving a new blood pressure monitor  to check her values. . See Care Plan below for interventions and patient self-care actives.  Recommendation: The patient may benefit from taking medications as prescribed, checking Blood sugar and record values, checking blood pressures and record values, calling your physician if numbers are abnormal, and The patient agrees.  Follow up Plan: Patient would like continued follow-up.  CCM RNCM will outreach the patient within the next 2 weeks  Patient will call office if needed prior to next encounter   SDOH (Social Determinants of Health) screening and interventions performed today: No     RN Plan of Care for Conditions/Un Met Needs Addressed and Monitored  Patient Care Plan: RN Case Manager     Problem Identified: Glycemic Management and Hypertension      Long-Range Goal: Glycemic Management and Hypertention   Start Date: 06/24/2020  Expected End Date: 10/05/2021  Recent Progress: On track  Priority: High  Note:     Current Barriers:  Knowledge Deficits related to basic Diabetes pathophysiology and self care/management Knowledge Deficits related to basic Hypertension pathophysiology and self care/management.  Case Manager Clinical Goal(s):  Over the next 90 days, patient will demonstrate improved adherence to prescribed treatment plan for hypertension as evidenced by taking all medications as prescribed, monitoring and recording blood pressure  as directed, adhering to low sodium/DASH diet Over the next 90 days, patient will demonstrate improved adherence to prescribed treatment plan for diabetes self care/management as evidenced by: maintaining her blood sugars in the range on 125-135   Interventions: 1:1 collaboration with primary care provider regarding development and update of comprehensive plan of care as evidenced by provider attestation and co-signature Inter-disciplinary care team collaboration (see longitudinal plan of care) Evaluation of current treatment plan related to  self management and patient's adherence to plan as established by provider   Diabetes:  (Status: New goal.) Long Term Goal   Lab Results  Component Value Date   HGBA1C 6.0 01/12/2022   @ Assessed patient's understanding of A1c goal: <7% Provided education to patient about basic DM disease process; Reviewed medications with patient and discussed importance of medication adherence;        Reviewed prescribed diet with patient  Discussed plans with patient for ongoing care management follow up and provided patient with direct contact information for care management team;       Hypertension: (Status: New goal.) Long Term Goal  Last practice recorded BP readings:  BP Readings from Last 3 Encounters:  04/27/22 (!) 166/78  02/25/22 (!) 171/78  01/19/22 (!) 164/76  Most recent eGFR/CrCl:  Lab Results  Component Value Date   EGFR 5 (L) 09/01/2021    No components found for: CRCL  Evaluation of current treatment plan related to hypertension self management and patient's adherence to plan as established by provider;   Reviewed prescribed diet  Reviewed medications with patient and discussed importance of compliance;  Discussed plans with patient for ongoing care management follow up  and provided patient with direct contact information for care management team; Advised patient, providing education and rationale, to monitor blood pressure daily and  record, calling PCP for findings outside established parameters;  04/29/22:  Today, I spoke to [name] about our care coordination program and my colleagues' role. She expressed interest in participating since she lives with her boyfriend and can do daily tasks. However, she needs help checking her blood sugar and blood pressure and cleaning once she moves into her apartment from the hotel. She has a glucometer but needs a new blood pressure monitor, which I recommended from [employer] since her insurance covers it. The pharmacy will deliver it to This address because she did not want it delivered to the hotel, scared it would get stolen. The address given was [address] [place]. Her physician had already sent a prescription previously to the pharmacy, so we called them on a conference call, and they confirmed they had the prescription and would deliver it. Wyatt Portela, the Education officer, museum, will call her on 6/14 as well.  Patient Goals/Self Care Activities: -Patient/Caregiver will self-administer medications as prescribed as evidenced by self-report/primary caregiver report  -Patient/Caregiver will attend all scheduled provider appointments as evidenced by clinician review of documented attendance to scheduled appointments and patient/caregiver report -Patient/Caregiver will call pharmacy for medication refills as evidenced by patient report and review of pharmacy fill history as appropriate -Patient/Caregiver will call provider office for new concerns or questions as evidenced by review of documented incoming telephone call notes and patient report -Patient/Caregiver verbalizes understanding of plan -Patient/Caregiver will focus on medication adherence by taking medications as prescribed  -Calls provider office for new concerns, questions, or BP outside discussed parameters -Checks BP and records as discussed -Follows a low sodium diet/DASH diet -check blood sugar at prescribed  times -check blood sugar if I feel it is too high or too low -record values and write them down take them to all doctor visits          Lazaro Arms RN, BSN, [place]  Phone: [phone]

## 2022-05-07 ENCOUNTER — Ambulatory Visit: Payer: Medicare Other

## 2022-05-07 NOTE — Chronic Care Management (AMB) (Signed)
Chronic Care Management   CCM RN Visit Note  05/07/2022 Name: [name] MRN: [id number] DOB: 05/15/1966  Subjective: [name] is a 56 y.o. year old female who is a primary care patient of [name] Damme, MD. The care management team was consulted for assistance with disease management and care coordination needs.    Engaged with patient by telephone for follow up visit in response to provider referral for case management and/or care coordination services.   Consent to Services:  The patient was given information about Chronic Care Management services, agreed to services, and gave verbal consent prior to initiation of services.  Please see initial visit note for detailed documentation.   Patient agreed to services and verbal consent obtained.    Summary:  The patient is doing well and has her blood pressure monitor.  She has not checked her blood pressure or blood sugar but said that her niece will come and help her.  She denies having any problems. . See Care Plan below for interventions and patient self-care actives.  Recommendation: The patient may benefit from taking medications as prescribed, monitoring food intake, checking Blood sugar and record values, checking blood pressures and record values, calling your physician if numbers are abnormal, and The patient agrees.  Follow up Plan: Patient would like continued follow-up.  CCM RNCM will outreach the patient within the next 4 weeks.  Patient will call office if needed prior to next encounter   Assessment: Review of patient past medical history, allergies, medications, health status, including review of consultants reports, laboratory and other test data, was performed as part of comprehensive evaluation and provision of chronic care management services.   SDOH (Social Determinants of Health) assessments and interventions performed:  No  CCM Care Plan    Conditions to be addressed/monitored:HTN and DMII  Care  Plan : RN Case Manager  Updates made by [name] Arms, RN since 05/07/2022 12:00 AM     Problem: Glycemic Management and Hypertension      Long-Range Goal: Glycemic Management and Hypertention   Start Date: 06/24/2020  Expected End Date: 10/05/2022  Recent Progress: On track  Priority: High  Note:     Current Barriers:  Knowledge Deficits related to basic Diabetes pathophysiology and self care/management Knowledge Deficits related to basic Hypertension pathophysiology and self care/management.  Case Manager Clinical Goal(s):  Over the next 90 days, patient will demonstrate improved adherence to prescribed treatment plan for hypertension as evidenced by taking all medications as prescribed, monitoring and recording blood pressure as directed, adhering to low sodium/DASH diet Over the next 90 days, patient will demonstrate improved adherence to prescribed treatment plan for diabetes self care/management as evidenced by: maintaining her blood sugars in the range on 125-135   Interventions: 1:1 collaboration with primary care provider regarding development and update of comprehensive plan of care as evidenced by provider attestation and co-signature Inter-disciplinary care team collaboration (see longitudinal plan of care) Evaluation of current treatment plan related to  self management and patient's adherence to plan as established by provider   Diabetes:  (Status: New goal.) Long Term Goal   Lab Results  Component Value Date   HGBA1C 6.0 01/12/2022   @ Assessed patient's understanding of A1c goal: <7% Provided education to patient about basic DM disease process; Reviewed medications with patient and discussed importance of medication adherence;        Reviewed prescribed diet with patient  Discussed plans with patient for ongoing care management follow up and  provided patient with direct contact information for care management team;       Hypertension: (Status: New goal.) Long  Term Goal  Last practice recorded BP readings:  BP Readings from Last 3 Encounters:  04/27/22 (!) 166/78  02/25/22 (!) 171/78  01/19/22 (!) 164/76  Most recent eGFR/CrCl:  Lab Results  Component Value Date   EGFR 5 (L) 09/01/2021    No components found for: CRCL  Evaluation of current treatment plan related to hypertension self management and patient's adherence to plan as established by provider;   Reviewed prescribed diet  Reviewed medications with patient and discussed importance of compliance;  Discussed plans with patient for ongoing care management follow up and provided patient with direct contact information for care management team; Advised patient, providing education and rationale, to monitor blood pressure daily and record, calling PCP for findings outside established parameters;  05/07/22:  I spoke with [name] this morning, and she is doing well. She did receive her Blood pressure monitor. She has not been able to check her blood pressure or blood sugar yet, but her niece will come and help her. She denies headaches, chest pain, flushing, or polyuria. I reminded her that the social worker [name] would call her on 6/14 to talk, and she had an appointment with [name] on 6/20th. She verbalized understanding.   Patient Goals/Self Care Activities: -Patient/Caregiver will self-administer medications as prescribed as evidenced by self-report/primary caregiver report  -Patient/Caregiver will attend all scheduled provider appointments as evidenced by clinician review of documented attendance to scheduled appointments and patient/caregiver report -Patient/Caregiver will call pharmacy for medication refills as evidenced by patient report and review of pharmacy fill history as appropriate -Patient/Caregiver will call provider office for new concerns or questions as evidenced by review of documented incoming telephone call notes and patient report -Patient/Caregiver verbalizes  understanding of plan -Patient/Caregiver will focus on medication adherence by taking medications as prescribed  -Calls provider office for new concerns, questions, or BP outside discussed parameters -Checks BP and records as discussed -Follows a low sodium diet/DASH diet -check blood sugar at prescribed times -check blood sugar if I feel it is too high or too low -record values and write them down take them to all doctor visits         [name] Arms RN, BSN, [place]  Phone: [phone]

## 2022-05-07 NOTE — Patient Instructions (Signed)
Visit Information  Ms. Balint  it was nice speaking with you. Please call me directly [phone] if you have questions about the goals we discussed.    Patient Goals/Self Care Activities: -Patient/Caregiver will self-administer medications as prescribed as evidenced by self-report/primary caregiver report  -Patient/Caregiver will attend all scheduled provider appointments as evidenced by clinician review of documented attendance to scheduled appointments and patient/caregiver report -Patient/Caregiver will call pharmacy for medication refills as evidenced by patient report and review of pharmacy fill history as appropriate -Patient/Caregiver will call provider office for new concerns or questions as evidenced by review of documented incoming telephone call notes and patient report -Patient/Caregiver verbalizes understanding of plan -Patient/Caregiver will focus on medication adherence by taking medications as prescribed  -Calls provider office for new concerns, questions, or BP outside discussed parameters -Checks BP and records as discussed -Follows a low sodium diet/DASH diet -check blood sugar at prescribed times -check blood sugar if I feel it is too high or too low -record values and write them down take them to all doctor visits    The patient verbalized understanding of instructions, educational materials, and care plan provided today and agreed to receive a mailed copy of patient instructions, educational materials, and care plan.   Follow up Plan: Patient would like continued follow-up.  CCM RNCM will outreach the patient within the next 4 weeks.  Patient will call office if needed prior to next encounter  Lazaro Arms, RN  [phone]

## 2022-05-11 ENCOUNTER — Other Ambulatory Visit: Payer: Self-pay

## 2022-05-12 MED ORDER — HYDRALAZINE HCL 10 MG PO TABS: 10.0000 mg | ORAL_TABLET | Freq: Three times a day (TID) | ORAL | 1 refills | Status: DC

## 2022-05-16 ENCOUNTER — Other Ambulatory Visit: Payer: Self-pay | Admitting: Family Medicine

## 2022-05-19 ENCOUNTER — Ambulatory Visit: Payer: Medicare Other | Admitting: Licensed Clinical Social Worker

## 2022-05-19 NOTE — Patient Instructions (Signed)
Visit Information  Instructions: patient will work with SW to address concerns related to Housing  Patient was given the following information about care management and care coordination services today, agreed to services, and gave verbal consent: 1.care management/care coordination services include personalized support from designated clinical staff supervised by their physician, including individualized plan of care and coordination with other care providers 2. 24/7 contact phone numbers for assistance for urgent and routine care needs. 3. The patient may stop care management/care coordination services at any time by phone call to the office staff.  Patient verbalizes understanding of instructions and care plan provided today and agrees to view in MyChart. Active MyChart status and patient understanding of how to access instructions and care plan via MyChart confirmed with patient.     The care management team will reach out to the patient again over the next 30 days.   Karcyn Menn, BSW , MSW Social Worker [facility]  [phone]      

## 2022-05-19 NOTE — Chronic Care Management (AMB) (Signed)
  Care Management   Social Work Visit Note  05/19/2022 Name: CASHLYNN YEARWOOD MRN: [id number] DOB: 07/26/1966  BRITTINEY DICOSTANZO is a 56 y.o. year old female who sees [name], MD for primary care. The care management team was consulted for assistance with care management and care coordination needs related to  Initial Outreach.    Patient was given the following information about care management and care coordination services today, agreed to services, and gave verbal consent: 1.care management/care coordination services include personalized support from designated clinical staff supervised by their physician, including individualized plan of care and coordination with other care providers 2. 24/7 contact phone numbers for assistance for urgent and routine care needs. 3. The patient may stop care management/care coordination services at any time by phone call to the office staff.  Engaged with patient by telephone for initial visit in response to provider referral for social work chronic care management and care coordination services.  Assessment: Review of patient history, allergies, and health status during evaluation of patient need for care management/care coordination services.    Interventions:  Patient interviewed and appropriate assessments performed Collaborated with clinical team regarding patient needs  Successful outreach to patient on today.  SW completed SDOH and Needs assessment.  Patient is currently living in a hotel. Housing authority no longer approved the apartment she was in and would no longer assist financially. Patient has been approved for a new apartment through Housing authority, but must wait 30 days before move in.  Patient inquired on health insurance. SW educated patient on [employer] and options of obtaining life insurance through her bank. Patient had concerns regarding scammers and not trusting agencies. Patient stated her niece would assist her. Patient  stated she would call SW if she needed further assistance.   SDOH (Social Determinants of Health) assessments performed: Yes     Plan:  SW will follow up with patient within 30 days.   Lenor Derrick , MSW Social Worker [facility] Care Management  [phone]

## 2022-05-25 ENCOUNTER — Ambulatory Visit (INDEPENDENT_AMBULATORY_CARE_PROVIDER_SITE_OTHER): Payer: Medicare Other | Admitting: Family Medicine

## 2022-05-25 ENCOUNTER — Encounter: Payer: Self-pay | Admitting: Family Medicine

## 2022-05-25 VITALS — BP 115/52 | HR 84 | Ht 67.0 in | Wt 201.4 lb

## 2022-05-25 DIAGNOSIS — I159 Secondary hypertension, unspecified: Secondary | ICD-10-CM | POA: Diagnosis not present

## 2022-05-25 DIAGNOSIS — N186 End stage renal disease: Secondary | ICD-10-CM

## 2022-05-25 DIAGNOSIS — E1142 Type 2 diabetes mellitus with diabetic polyneuropathy: Secondary | ICD-10-CM | POA: Diagnosis not present

## 2022-05-25 DIAGNOSIS — K219 Gastro-esophageal reflux disease without esophagitis: Secondary | ICD-10-CM

## 2022-05-25 DIAGNOSIS — E1122 Type 2 diabetes mellitus with diabetic chronic kidney disease: Secondary | ICD-10-CM | POA: Diagnosis not present

## 2022-05-25 DIAGNOSIS — Z992 Dependence on renal dialysis: Secondary | ICD-10-CM

## 2022-05-25 DIAGNOSIS — Z794 Long term (current) use of insulin: Secondary | ICD-10-CM

## 2022-05-25 MED ORDER — OMEPRAZOLE 20 MG PO CPDR: 20.0000 mg | DELAYED_RELEASE_CAPSULE | Freq: Every day | ORAL | 1 refills | Status: DC

## 2022-05-25 MED ORDER — BLOOD GLUCOSE MONITOR KIT: PACK | 0 refills | Status: DC

## 2022-05-25 MED ORDER — GABAPENTIN 100 MG PO CAPS: 100.0000 mg | ORAL_CAPSULE | Freq: Three times a day (TID) | ORAL | 5 refills | Status: DC

## 2022-05-25 NOTE — Assessment & Plan Note (Signed)
Globus sensation and sx gone with trial of PPI

## 2022-05-25 NOTE — Patient Instructions (Addendum)
It was a pleasure to see you today!  For your blood pressure: try taking your blood pressure medication after dialysis. This way you won't dip low during HD. I have refilled gabapentin (for leg pain) and omeprazole for reflux For your blood sugar: I have made and appt for you on Tuesday June 27th at 11:15 AM to come in to discuss a continuous glucose monitor Follow up with me in 1 month  Be Well,  Dr. Chauncey Reading

## 2022-05-25 NOTE — Assessment & Plan Note (Signed)
BP less than goal. Can decrease hydralazine from TID to BID if needed for low BP. Also recommend taking BP meds after dialysis to avoid lows during HD session.

## 2022-05-25 NOTE — Assessment & Plan Note (Signed)
Refilled gabapentin 100 mg TID

## 2022-05-25 NOTE — Progress Notes (Signed)
    SUBJECTIVE:   CHIEF COMPLAINT / HPI:   Hypertension: BP today is at goal: 115/55. She reports that she has gotten her BP cuff. She notes that her BP drops particularly low during dialysis. Current meds are amlodipine 10, hydralazine 10 mg 3 times daily, losartan 50 mg 3 times daily.  She reports that she does not have any dizziness, no history of syncope, no problems with transfers.  DM2: patient has glucometer, but unable to check blood glucose due to amputated fingers.  She is a good candidate for CGM given that she has 1 injection of insulin daily, but cannot use a glucometer.  GERD: improvement, no coughing or globus.   Neuropathy: gabapentin almost out, taking TID and controlled. Will refill.  Pharmacy: prefers [facility].  PERTINENT  PMH / PSH: ESRD on HD, type 2 diabetes, hypertension, status post BKA right leg  OBJECTIVE:   BP (!) 115/52   Pulse 84   Ht 5\' 7"  (1.702 m)   Wt 201 lb 6.4 oz (91.4 kg)   LMP 03/07/2019   SpO2 91%   BMI 31.54 kg/m   Nursing note and vitals reviewed GEN: Age-appropriate, African-American woman, resting comfortably in chair, NAD, class I obesity Cardiac: Regular rate and rhythm. Normal S1/S2. No murmurs, rubs, or gallops appreciated. 2+ radial pulses. Lungs: Clear bilaterally to ascultation. No increased WOB, no accessory muscle usage. No w/r/r. Neuro: AOx3  Ext: no edema Psych: Pleasant and appropriate  ASSESSMENT/PLAN:   Type 2 diabetes mellitus ([place]) Schedule patient for pharmacy appointment to start CGM.  Due to eyesight she will need help learning how to do CGM.  She is appropriate patient as she is missing several fingers from amputations due to diabetes, legally blind. Once have a method of monitoring BG, can adjust insulin regimen. A1c is not reliable due to ESRD on HD and unable to get fructosamine in clinic as patient is a vasculopath and unable to get blood today or at any visit with me recently.  Secondary hypertension,  unspecified BP less than goal. Can decrease hydralazine from TID to BID if needed for low BP. Also recommend taking BP meds after dialysis to avoid lows during HD session.  GERD (gastroesophageal reflux disease) Globus sensation and sx gone with trial of PPI  Diabetic peripheral neuropathy (HCC) Refilled gabapentin 100 mg TID     [name], MD [place]

## 2022-05-25 NOTE — Assessment & Plan Note (Signed)
Schedule patient for pharmacy appointment to start CGM.  Due to eyesight she will need help learning how to do CGM.  She is appropriate patient as she is missing several fingers from amputations due to diabetes, legally blind. Once have a method of monitoring BG, can adjust insulin regimen. A1c is not reliable due to ESRD on HD and unable to get fructosamine in clinic as patient is a vasculopath and unable to get blood today or at any visit with me recently.

## 2022-06-01 ENCOUNTER — Ambulatory Visit: Payer: Medicare Other | Admitting: Pharmacist

## 2022-06-10 ENCOUNTER — Telehealth: Payer: Medicare Other

## 2022-06-10 ENCOUNTER — Telehealth: Payer: Self-pay

## 2022-06-10 NOTE — Telephone Encounter (Signed)
   RN Case Manager Care Management   Phone Outreach    06/10/2022 Name: [name] MRN: [id number] DOB: 1965-12-20  [name] is a 56 y.o. year old female who is a primary care patient of August Albino, MD .   Telephone outreach was unsuccessful A HIPPA compliant phone message was left for the patient providing contact information and requesting a return call.   Follow Up Plan: Will route chart to Care Guide to see if patient would like to reschedule phone appointment.    Review of patient status, including review of consultants reports, relevant laboratory and other test results, and collaboration with appropriate care team members and the patient's provider was performed as part of comprehensive patient evaluation and provision of care management services.    Lazaro Arms RN, BSN, [facility] Care Management Coordinator [place] Phone: [phone] Fax: [phone]

## 2022-06-16 ENCOUNTER — Ambulatory Visit: Payer: Medicare Other | Admitting: Licensed Clinical Social Worker

## 2022-06-16 NOTE — Patient Instructions (Signed)
Visit Information  Instructions:   Patient was given the following information about care management and care coordination services today, agreed to services, and gave verbal consent: 1.care management/care coordination services include personalized support from designated clinical staff supervised by their physician, including individualized plan of care and coordination with other care providers 2. 24/7 contact phone numbers for assistance for urgent and routine care needs. 3. The patient may stop care management/care coordination services at any time by phone call to the office staff.  Patient verbalizes understanding of instructions and care plan provided today and agrees to view in MyChart. Active MyChart status and patient understanding of how to access instructions and care plan via MyChart confirmed with patient.     No further follow up required: .  Sihaam Chrobak, BSW , MSW Social Worker [facility] Care Management  [phone]      

## 2022-06-16 NOTE — Chronic Care Management (AMB) (Signed)
  Care Management   Social Work Visit Note  06/16/2022 Name: [name] MRN: [id number] DOB: [birth year]  [name] is a 56 y.o. year old female who sees August Albino, MD for primary care. The care management team was consulted for assistance with care management and care coordination needs related to [facility] Resources    Patient was given the following information about care management and care coordination services today, agreed to services, and gave verbal consent: 1.care management/care coordination services include personalized support from designated clinical staff supervised by their physician, including individualized plan of care and coordination with other care providers 2. 24/7 contact phone numbers for assistance for urgent and routine care needs. 3. The patient may stop care management/care coordination services at any time by phone call to the office staff.  Engaged with patient by telephone for follow up visit in response to provider referral for social work chronic care management and care coordination services.  Assessment: Review of patient history, allergies, and health status during evaluation of patient need for care management/care coordination services.    Interventions:  Patient interviewed and appropriate assessments performed Collaborated with clinical team regarding patient needs  Successful outreach to patient on today. Needs assessment completed. No additional concerns or needs at this time per patient.  SW advised patient to reach back out to PCP if further assistance is needed. SW has removed her self from the care team.    Plan:  No additional follow up at this time.  Lenor Derrick , MSW Social Worker [facility] Care Management  [phone]

## 2022-06-17 ENCOUNTER — Other Ambulatory Visit: Payer: Self-pay

## 2022-06-17 NOTE — Telephone Encounter (Signed)
Patient is using [facility] going forward.   Patients chart has been updated to reflect change.

## 2022-06-24 ENCOUNTER — Encounter: Payer: Self-pay | Admitting: Family Medicine

## 2022-06-24 ENCOUNTER — Ambulatory Visit (INDEPENDENT_AMBULATORY_CARE_PROVIDER_SITE_OTHER): Payer: Medicare Other | Admitting: Family Medicine

## 2022-06-24 VITALS — BP 155/71 | HR 83 | Ht 67.0 in | Wt 198.6 lb

## 2022-06-24 DIAGNOSIS — I159 Secondary hypertension, unspecified: Secondary | ICD-10-CM

## 2022-06-24 DIAGNOSIS — E1122 Type 2 diabetes mellitus with diabetic chronic kidney disease: Secondary | ICD-10-CM | POA: Diagnosis not present

## 2022-06-24 DIAGNOSIS — E1142 Type 2 diabetes mellitus with diabetic polyneuropathy: Secondary | ICD-10-CM | POA: Diagnosis not present

## 2022-06-24 DIAGNOSIS — Z794 Long term (current) use of insulin: Secondary | ICD-10-CM | POA: Diagnosis not present

## 2022-06-24 DIAGNOSIS — N186 End stage renal disease: Secondary | ICD-10-CM

## 2022-06-24 DIAGNOSIS — K219 Gastro-esophageal reflux disease without esophagitis: Secondary | ICD-10-CM | POA: Diagnosis not present

## 2022-06-24 DIAGNOSIS — Z992 Dependence on renal dialysis: Secondary | ICD-10-CM

## 2022-06-24 LAB — POCT GLYCOSYLATED HEMOGLOBIN (HGB A1C): HbA1c, POC (controlled diabetic range): 8.1 % — AB (ref 0.0–7.0)

## 2022-06-24 NOTE — Progress Notes (Signed)
    SUBJECTIVE:   CHIEF COMPLAINT / HPI:   Type 2 Diabetes Patient is a 56 y.o. female who presents today for diabetes follow-up.  Home medications include: Lantus 10 units BID  Patient reports excellent medication compliance. Patient does not check sugar at home. She is legally blind and s/p 2 finger amputations, so there are significant barriers to checking sugars at home.  No recent hypoglycemic symptoms.   Most recent A1Cs:  Lab Results  Component Value Date   HGBA1C 8.1 (A) 06/24/2022   HGBA1C 6.0 01/12/2022   HGBA1C 6.9 (A) 01/13/2021   Last Microalbumin, LDL, Creatinine: Lab Results  Component Value Date   MICROALBUR 65.23 (H) 01/09/2012   LDLCALC 49 01/19/2022   CREATININE 9.05 (H) 09/01/2021    Patient is up to date on diabetic eye per her report. Also has upcoming eye exam next month Patient is up to date on diabetic foot exam.   PERTINENT  PMH / PSH: T2DM, ESRD on HD (x6 years), HTN, legally blind, s/p R BKA  OBJECTIVE:   BP (!) 155/71   Pulse 83   Ht 5\' 7"  (1.702 m)   Wt 198 lb 9.6 oz (90.1 kg)   LMP 03/07/2019   SpO2 100%   BMI 31.11 kg/m   General: NAD, pleasant, able to participate in exam Respiratory: No respiratory distress Skin: warm and dry, no rashes noted Psych: Normal affect and mood Neuro: grossly intact  ASSESSMENT/PLAN:   Type 2 diabetes mellitus (HCC) A1c 8.1% today, which is likely falsely low due to ESRD on HD. Patient would benefit from CGM-- needs to check sugar due to insulin use, but has several barriers including multiple finger amputations and legally blind. Will route chart to pharmacy staff to assist with CGM initiation. Of note, patient does not have smart phone so she will need reader/receiver. Continue current meds (Lantus 10u BID) for now.   Med Refills Patient requests refills on all her medications. 90 day supply sent to [facility] per her request.  Alcus Dad, MD [place]

## 2022-06-24 NOTE — Patient Instructions (Addendum)
It was great to meet you!  Things we discussed at today's visit: -Your A1c was 8.1% today.  This tells me your sugars are running higher than we'd like. -Continue your current medications without changes -Our pharmacy staff will work to get you a continuous glucose monitor -I will send refills on all your medications to [facility] by the end of the day  Take care and seek immediate care sooner if you develop any concerns.  Dr. [facility]

## 2022-06-25 ENCOUNTER — Encounter (HOSPITAL_COMMUNITY): Payer: Self-pay

## 2022-06-25 ENCOUNTER — Other Ambulatory Visit (HOSPITAL_COMMUNITY): Payer: Self-pay

## 2022-06-25 MED ORDER — LOSARTAN POTASSIUM 50 MG PO TABS: 50.0000 mg | ORAL_TABLET | Freq: Every day | ORAL | 0 refills | Status: DC

## 2022-06-25 MED ORDER — AMLODIPINE BESYLATE 10 MG PO TABS: 10.0000 mg | ORAL_TABLET | Freq: Every day | ORAL | 0 refills | Status: DC

## 2022-06-25 MED ORDER — ATORVASTATIN CALCIUM 40 MG PO TABS: 40.0000 mg | ORAL_TABLET | Freq: Every day | ORAL | 0 refills | Status: DC

## 2022-06-25 MED ORDER — HYDRALAZINE HCL 10 MG PO TABS: 10.0000 mg | ORAL_TABLET | Freq: Three times a day (TID) | ORAL | 0 refills | Status: DC

## 2022-06-25 MED ORDER — INSULIN GLARGINE 100 UNIT/ML SOLOSTAR PEN: 10.0000 [IU] | PEN_INJECTOR | Freq: Two times a day (BID) | SUBCUTANEOUS | 0 refills | Status: DC

## 2022-06-25 MED ORDER — GABAPENTIN 100 MG PO CAPS: 100.0000 mg | ORAL_CAPSULE | Freq: Three times a day (TID) | ORAL | 0 refills | Status: DC

## 2022-06-25 MED ORDER — OMEPRAZOLE 20 MG PO CPDR: 20.0000 mg | DELAYED_RELEASE_CAPSULE | Freq: Every day | ORAL | 0 refills | Status: DC

## 2022-06-25 NOTE — Assessment & Plan Note (Signed)
A1c 8.1% today, which is likely falsely low due to ESRD on HD. Patient would benefit from CGM-- needs to check sugar due to insulin use, but has several barriers including multiple finger amputations and legally blind. Will route chart to pharmacy staff to assist with CGM initiation. Of note, patient does not have smart phone so she will need reader/receiver. Continue current meds (Lantus 10u BID) for now.

## 2022-06-28 ENCOUNTER — Telehealth: Payer: Self-pay

## 2022-06-28 NOTE — Telephone Encounter (Signed)
Submitted CGM order to [facility] via [facility] manager.

## 2022-06-29 NOTE — Telephone Encounter (Signed)
Order accepted. Will f/u.

## 2022-06-30 ENCOUNTER — Telehealth: Payer: Self-pay

## 2022-06-30 NOTE — Telephone Encounter (Signed)
Patient calls nurse line reporting 3 missing prescriptions from 7/21 order.   Patient reports she received everything except for Lantus, Omeprazole and Hydralazine.   I called [facility] and they confirmed all 3 of those prescriptions were delivered to patients current residence ([facility]) and was signed by patient accepting order.   I attempted to call patient back at [facility], however no answer.

## 2022-07-01 ENCOUNTER — Telehealth: Payer: Self-pay | Admitting: *Deleted

## 2022-07-01 NOTE — Chronic Care Management (AMB) (Signed)
  Care Coordination Note  07/01/2022 Name: [name] MRN: [id number] DOB: February 20, 1966  [name] is a 56 y.o. year old female who is a primary care patient of August Albino, MD and is actively engaged with the care management team. I reached out to [name] by phone today to assist with re-scheduling a follow up visit with the RN Case Manager  Follow up plan: Unsuccessful telephone outreach attempt made. A HIPAA compliant phone message was left for the patient providing contact information and requesting a return call.  The care management team will reach out to the patient again over the next 7 days.  If patient returns call to provider office, please advise to call [place] at [phone].  [place]  Direct Dial: [phone]

## 2022-07-07 NOTE — Telephone Encounter (Signed)
Order cancelled today. Pt must call [facility] to reinstate shipment. See activity below:

## 2022-07-19 NOTE — Chronic Care Management (AMB) (Signed)
  Care Coordination Note  07/19/2022 Name: [name] MRN: [id number] DOB: September 17, 1966  [name] is a 56 y.o. year old female who is a primary care patient of August Albino, MD and is actively engaged with the care management team. I reached out to [name] by phone today to assist with re-scheduling a follow up visit with the RN Case Manager  Follow up plan: Telephone appointment with care management team member scheduled for:07/20/22  [name]  Care Coordination Care Guide  Direct Dial: [phone]

## 2022-07-20 ENCOUNTER — Ambulatory Visit: Payer: Medicare Other

## 2022-07-20 NOTE — Chronic Care Management (AMB) (Signed)
RNCM Care Management Note 07/20/2022 Name: [name] MRN: [id number] DOB: 11-Feb-1966   [name] is a 56 y.o. year old female who sees August Albino, MD for primary care. RNCM was consulted  to assistance patient with  Care Coordination.      Engaged with patient by telephone for follow up visit in response to provider referral for Care Coordination.     Summary: [name], and she is doing well. Her blood pressure fluctuates, and she states that she has a niece who is supposed to help her check, but she needs someone. She has not been monitoring her blood sugar, and her A1C has increased to 8.1.[name] See Care Plan below for interventions and patient self-care actives.  Recommendation: The patient may benefit from taking medications as prescribed, checking Blood sugar and record values, checking blood pressures and record values, calling your physician if numbers are abnormal, and The patient agrees.  Follow up Plan: Patient would like continued follow-up.  CM RNCM will outreach the patient within the next 4 weeks.  Patient will call office if needed prior to next encounter   SDOH (Social Determinants of Health) screening and interventions performed today: Yes. SDOH Interventions    Flowsheet Row Most Recent Value  SDOH Interventions   Housing Interventions --  [patient is living in motel and she is wanting to talk with social worker for help]         RN Plan of Care for Conditions/Un Met Needs Addressed and Monitored  Patient Care Plan: RN Case Manager     Problem Identified: Glycemic Management and Hypertension Resolved 07/20/2022     Long-Range Goal: Glycemic Management and Hypertention Completed 07/20/2022  Start Date: 06/24/2020  Expected End Date: 10/05/2022  Recent Progress: On track  Priority: High  Note:     Current Barriers:  Knowledge Deficits related to basic Diabetes pathophysiology and self care/management Knowledge Deficits related to basic  Hypertension pathophysiology and self care/management.  Case Manager Clinical Goal(s):  Over the next 90 days, patient will demonstrate improved adherence to prescribed treatment plan for hypertension as evidenced by taking all medications as prescribed, monitoring and recording blood pressure as directed, adhering to low sodium/DASH diet Over the next 90 days, patient will demonstrate improved adherence to prescribed treatment plan for diabetes self care/management as evidenced by: maintaining her blood sugars in the range on 125-135   Interventions: 1:1 collaboration with primary care provider regarding development and update of comprehensive plan of care as evidenced by provider attestation and co-signature Inter-disciplinary care team collaboration (see longitudinal plan of care) Evaluation of current treatment plan related to  self management and patient's adherence to plan as established by provider   Diabetes:  (Status: New goal.) Long Term Goal   Lab Results  Component Value Date   HGBA1C 8.1 (A) 06/24/2022   @ Assessed patient's understanding of A1c goal: <7% Provided education to patient about basic DM disease process; Reviewed medications with patient and discussed importance of medication adherence;        Reviewed prescribed diet with patient  Discussed plans with patient for ongoing care management follow up and provided patient with direct contact information for care management team;       Hypertension: (Status: New goal.) Long Term Goal  Last practice recorded BP readings:  BP Readings from Last 3 Encounters:  06/24/22 (!) 155/71  05/25/22 (!) 115/52  04/27/22 (!) 166/78  Most recent eGFR/CrCl:  Lab Results  Component Value Date  EGFR 5 (L) 09/01/2021    No components found for: "CRCL"  Evaluation of current treatment plan related to hypertension self management and patient's adherence to plan as established by provider;   Reviewed prescribed diet  Reviewed  medications with patient and discussed importance of compliance;  Discussed plans with patient for ongoing care management follow up and provided patient with direct contact information for care management team; Advised patient, providing education and rationale, to monitor blood pressure daily and record, calling PCP for findings outside established parameters;  07/20/22:  I spoke with [name], and she is doing well. Her blood pressure fluctuates, and she states that she has a niece who is supposed to help her check, but she needs someone. She has not been monitoring her blood sugar, and her A1C has increased to 8.1. She visited her physician, and they will try to get her a continuous monitor. I advised the patient to continue with her medication and asked her fiance if he could help. I will call Milus Height, Social Worker, to see what advice she can give about [name]'s housing situation and Government social research officer.   Patient Goals/Self Care Activities: -Patient/Caregiver will self-administer medications as prescribed as evidenced by self-report/primary caregiver report  -Patient/Caregiver will attend all scheduled provider appointments as evidenced by clinician review of documented attendance to scheduled appointments and patient/caregiver report -Patient/Caregiver will call pharmacy for medication refills as evidenced by patient report and review of pharmacy fill history as appropriate -Patient/Caregiver will call provider office for new concerns or questions as evidenced by review of documented incoming telephone call notes and patient report -Patient/Caregiver verbalizes understanding of plan -Patient/Caregiver will focus on medication adherence by taking medications as prescribed  -Calls provider office for new concerns, questions, or BP outside discussed parameters -Checks BP and records as discussed -Follows a low sodium diet/DASH diet -check blood sugar at prescribed times -check blood sugar  if I feel it is too high or too low -record values and write them down take them to all doctor visits    Resolving due to duplicate goal           Lazaro Arms RN, BSN, [facility] Care Management Coordinator [place] [facility]   Phone: [phone]

## 2022-07-20 NOTE — Patient Instructions (Signed)
Visit Information  [name]  it was nice speaking with you. Please call me directly [phone] if you have questions about the goals we discussed.  Patient Goals/Self Care Activities: -Patient/Caregiver will self-administer medications as prescribed as evidenced by self-report/primary caregiver report  -Patient/Caregiver will attend all scheduled provider appointments as evidenced by clinician review of documented attendance to scheduled appointments and patient/caregiver report -Patient/Caregiver will call pharmacy for medication refills as evidenced by patient report and review of pharmacy fill history as appropriate -Patient/Caregiver will call provider office for new concerns or questions as evidenced by review of documented incoming telephone call notes and patient report -Patient/Caregiver verbalizes understanding of plan -Patient/Caregiver will focus on medication adherence by taking medications as prescribed  -Calls provider office for new concerns, questions, or BP outside discussed parameters -Checks BP and records as discussed -Follows a low sodium diet/DASH diet -check blood sugar at prescribed times -check blood sugar if I feel it is too high or too low -record values and write them down take them to all doctor visits       The patient is blind and does not want the AFTER VISIT SUMMERY  Follow up Plan: Patient would like continued follow-up.  CM RNCM will outreach the patient within the next 4 weeks  Patient will call office if needed prior to next encounter  Lazaro Arms, RN  [phone]

## 2022-07-21 NOTE — Telephone Encounter (Signed)
Contacted patient and discussed CGM  Due to her being at dialysis she could not take the [facility] contact number from me.   She is available later today ~ 4:00 PM F/U planned for later today.

## 2022-07-21 NOTE — Telephone Encounter (Signed)
Noted and agree. 

## 2022-08-05 ENCOUNTER — Ambulatory Visit: Payer: Medicare Other | Admitting: Family Medicine

## 2022-08-10 ENCOUNTER — Ambulatory Visit: Payer: Medicare Other | Admitting: Family Medicine

## 2022-08-12 ENCOUNTER — Other Ambulatory Visit: Payer: Self-pay | Admitting: Family Medicine

## 2022-08-12 ENCOUNTER — Ambulatory Visit: Payer: Self-pay

## 2022-08-12 DIAGNOSIS — E1122 Type 2 diabetes mellitus with diabetic chronic kidney disease: Secondary | ICD-10-CM

## 2022-08-12 MED ORDER — BLOOD GLUCOSE MONITOR KIT: PACK | 0 refills | Status: DC

## 2022-08-12 NOTE — Patient Outreach (Signed)
  Care Coordination   Follow Up Visit Note   08/12/2022 Name: [name] MRN: [id number] DOB: 07-20-1966  CHRISTIE VISCOMI is a 56 y.o. year old female who sees August Albino, MD for primary care. I spoke with  [name] by phone today.  What matters to the patients health and wellness today?  I need my medications.    Goals Addressed             This Visit's Progress    I need to monitor and manage my HTN and DM II       Care Coordination Interventions: Active listening / Reflection utilized  Emotional Support Provided Problem [place] strategies reviewed Reviewed medications with patient and discussed importance of medication adherence Discussed plans with patient for ongoing care management follow up and provided patient with direct contact information for care management team Reviewed scheduled/upcoming provider appointments including: 08/19/22 at 845 am Advised patient, providing education and rationale, to check cbg and record, calling the office for findings outside established parameters Review of patient status, including review of consultants reports, relevant laboratory and other test results, and medications completed Note sent to PCP for new glucometer to paytient states that her old one is in storage. [place] pharmacy to have medications delivered.     SDOH assessments and interventions completed:  No     Care Coordination Interventions Activated:  Yes  Care Coordination Interventions:  Yes, provided   Follow up plan: Follow up call scheduled for 10/10 10 am    Encounter Outcome:  Pt. Visit Completed   Lazaro Arms RN, BSN, [facility]   Phone: [phone]

## 2022-08-12 NOTE — Patient Instructions (Signed)
Visit Information  Thank you for taking time to visit with me today. Please don't hesitate to contact me if I can be of assistance to you.   Following are the goals we discussed today:   Goals Addressed             This Visit's Progress    I need to monitor and manage my HTN and DM II       Care Coordination Interventions: Active listening / Reflection utilized  Emotional Support Provided Problem [place] strategies reviewed Reviewed medications with patient and discussed importance of medication adherence Discussed plans with patient for ongoing care management follow up and provided patient with direct contact information for care management team Reviewed scheduled/upcoming provider appointments including: 08/19/22 at 845 am Advised patient, providing education and rationale, to check cbg and record, calling the office for findings outside established parameters Review of patient status, including review of consultants reports, relevant laboratory and other test results, and medications completed Note sent to PCP for new glucometer to paytient states that her old one is in storage. [place] pharmacy to have medications delivered.     Track and Manage My Blood Pressure-Hypertension       Timeframe:  Long-Range Goal Priority:  High Start Date:   01/16/21                          Expected End Date: 06/04/21                       - check blood pressure 3 times per week - choose a place to take my blood pressure (home, clinic or office, retail store) - write blood pressure results in a log or diary    Why is this important?   You won't feel high blood pressure, but it can still hurt your blood vessels.  High blood pressure can cause heart or kidney problems. It can also cause a stroke.  Making lifestyle changes like losing a little weight or eating less salt will help.  Checking your blood pressure at home and at different times of the day can help to control blood  pressure.  If the doctor prescribes medicine remember to take it the way the doctor ordered.  Call the office if you cannot afford the medicine or if there are questions about it.     Notes:         Our next appointment is by telephone on 10/10 at 10 am  Please call the care guide team at [phone] if you need to cancel or reschedule your appointment.   The patient verbalized understanding of instructions, educational materials, and care plan provided today.  Lazaro Arms RN, BSN, [facility]   Phone: [phone]

## 2022-08-18 NOTE — Progress Notes (Signed)
    SUBJECTIVE:   CHIEF COMPLAINT / HPI:   Diabetes follow-up -ESRD on HD m/w/f, patient reports adherence to the schedule -Lantus 10 units twice daily prescribed, but patient reports taking 12u BID due to her desire to aggressively treat her diabetes. -unable to check home blood sugars because she did not receive her glucometer from her pharmacy despite it being prescribed a couple of weeks ago.  She does have lancets and test strips. -No symptoms of hypoglycemia within the last 3 months including weakness, lightheadedness, dizziness.  Also denies palpitations, CP/SOB, abdominal pain, diarrhea. -Neuropathy well controlled with gabapentin  HTN -Reports adherence to her medications but does not remember all of the names and does not have them with her today -ROS negative as above  Patient reports adherence to all of her prescribed medications but does not have all of her medications with her today.  Discussed that patient is due for mammogram, patient agrees to schedule this.  PERTINENT  PMH / PSH: ESRD on HD, T2DM last A1c 8.1 06/2022  OBJECTIVE:   BP (!) 133/53   Pulse 83   Ht 5\' 7"  (1.702 m)   Wt 201 lb (91.2 kg)   LMP 03/07/2019   SpO2 100%   BMI 31.48 kg/m    Gen: Chronically ill-appearing, legally blind, in wheelchair, NAD CV: RRR no MRG Pulm: CTAB normal WOB on RA Abd: Soft, NT/ND, +BS Ext: RLE BKA, 2 digits amputated from R hand  ASSESSMENT/PLAN:   Type 2 diabetes mellitus ([place]) Patient reports taking 12 units of Lantus twice daily.  She has not had any hypoglycemic symptoms with this dose; however, she has not been able to check her sugars at home.  Since we do not have adequate data to adjust her insulin regimen, I would like for her to take 10 units of Lantus as originally prescribed and follow-up in a few weeks when we hopefully have more information. -Lantus 10 units twice daily -Will reach out to care coordinator about her glucometer -BMP today  Secondary  hypertension, unspecified Patient reports adherence to her medications but does not have them with her today.  Blood pressure is 133/53 today and she is asymptomatic.  On losartan, Norvasc, hydralazine.  Most recent BMP on file is from 1 year ago, unable to see labs from her dialysis center. -BMP today  Healthcare maintenance Patient due for mammogram, agrees to schedule appointment.     August Albino, MD [facility]

## 2022-08-19 ENCOUNTER — Encounter: Payer: Self-pay | Admitting: Family Medicine

## 2022-08-19 ENCOUNTER — Ambulatory Visit (INDEPENDENT_AMBULATORY_CARE_PROVIDER_SITE_OTHER): Payer: Medicare Other | Admitting: Family Medicine

## 2022-08-19 VITALS — BP 133/53 | HR 83 | Ht 67.0 in | Wt 201.0 lb

## 2022-08-19 DIAGNOSIS — Z0000 Encounter for general adult medical examination without abnormal findings: Secondary | ICD-10-CM

## 2022-08-19 DIAGNOSIS — Z794 Long term (current) use of insulin: Secondary | ICD-10-CM

## 2022-08-19 DIAGNOSIS — N186 End stage renal disease: Secondary | ICD-10-CM

## 2022-08-19 DIAGNOSIS — Z992 Dependence on renal dialysis: Secondary | ICD-10-CM

## 2022-08-19 DIAGNOSIS — E1122 Type 2 diabetes mellitus with diabetic chronic kidney disease: Secondary | ICD-10-CM | POA: Diagnosis not present

## 2022-08-19 DIAGNOSIS — I159 Secondary hypertension, unspecified: Secondary | ICD-10-CM | POA: Diagnosis not present

## 2022-08-19 NOTE — Patient Instructions (Addendum)
It was wonderful to see you today.  Please bring ALL of your medications with you to every visit.   Today we talked about:  I recommend you undergo a mammogram.  You can call to schedule an appointment by calling [phone]. Directions [place], [place] [address]  Please take 10 units of insulin twice per day. Please check your blood sugars at home once you have the meter.  Please let me know if you have questions. I will send you a letter or call you with results.    Thank you for choosing [facility].   Please call [phone] with any questions about today's appointment.  Please be sure to schedule follow up at the front  desk before you leave today.   August Albino, MD

## 2022-08-19 NOTE — Assessment & Plan Note (Signed)
Patient reports taking 12 units of Lantus twice daily.  She has not had any hypoglycemic symptoms with this dose; however, she has not been able to check her sugars at home.  Since we do not have adequate data to adjust her insulin regimen, I would like for her to take 10 units of Lantus as originally prescribed and follow-up in a few weeks when we hopefully have more information. -Lantus 10 units twice daily -Will reach out to care coordinator about her glucometer -BMP today

## 2022-08-19 NOTE — Assessment & Plan Note (Signed)
Patient due for mammogram, agrees to schedule appointment.

## 2022-08-19 NOTE — Assessment & Plan Note (Signed)
Patient reports adherence to her medications but does not have them with her today.  Blood pressure is 133/53 today and she is asymptomatic.  On losartan, Norvasc, hydralazine.  Most recent BMP on file is from 1 year ago, unable to see labs from her dialysis center. -BMP today

## 2022-08-20 ENCOUNTER — Encounter: Payer: Self-pay | Admitting: Family Medicine

## 2022-08-20 LAB — BASIC METABOLIC PANEL
BUN/Creatinine Ratio: 6 — ABNORMAL LOW (ref 9–23)
BUN: 44 mg/dL — ABNORMAL HIGH (ref 6–24)
CO2: 28 mmol/L (ref 20–29)
Calcium: 9.7 mg/dL (ref 8.7–10.2)
Chloride: 92 mmol/L — ABNORMAL LOW (ref 96–106)
Creatinine, Ser: 7.59 mg/dL — ABNORMAL HIGH (ref 0.57–1.00)
Glucose: 221 mg/dL — ABNORMAL HIGH (ref 70–99)
Potassium: 5 mmol/L (ref 3.5–5.2)
Sodium: 138 mmol/L (ref 134–144)
eGFR: 6 mL/min/{1.73_m2} — ABNORMAL LOW (ref >59)

## 2022-08-24 ENCOUNTER — Telehealth: Payer: Self-pay

## 2022-09-02 NOTE — Addendum Note (Signed)
Addended by: Waldon Reining on: 09/02/2022 08:49 AM   Modules accepted: Orders, Level of Service

## 2022-09-02 NOTE — Telephone Encounter (Signed)
This encounter was created in error - please disregard.

## 2022-09-14 ENCOUNTER — Other Ambulatory Visit: Payer: Self-pay | Admitting: Family Medicine

## 2022-09-14 ENCOUNTER — Ambulatory Visit: Payer: Self-pay

## 2022-09-14 DIAGNOSIS — K219 Gastro-esophageal reflux disease without esophagitis: Secondary | ICD-10-CM

## 2022-09-14 NOTE — Patient Outreach (Signed)
  Care Coordination   09/14/2022 Name: [name] MRN: [id number] DOB: [birth year]   Care Coordination Outreach Attempts:  An unsuccessful telephone outreach was attempted today to offer the patient information about available care coordination services as a benefit of their health plan.   Follow Up Plan:  Additional outreach attempts will be made to offer the patient care coordination information and services.   Encounter Outcome:  No Answer  Care Coordination Interventions Activated:  No   Care Coordination Interventions:  No, not indicated    Lazaro Arms RN, BSN, [facility]   Phone: [phone]

## 2022-09-21 ENCOUNTER — Ambulatory Visit
Admission: RE | Admit: 2022-09-21 | Discharge: 2022-09-21 | Disposition: A | Discharge status: Home / Self Care | Payer: Medicare Other | Source: Ambulatory Visit | Attending: Family Medicine | Admitting: Family Medicine

## 2022-09-21 ENCOUNTER — Other Ambulatory Visit: Payer: Self-pay | Admitting: Family Medicine

## 2022-09-21 DIAGNOSIS — Z0000 Encounter for general adult medical examination without abnormal findings: Secondary | ICD-10-CM

## 2022-09-21 DIAGNOSIS — E1122 Type 2 diabetes mellitus with diabetic chronic kidney disease: Secondary | ICD-10-CM

## 2022-09-23 ENCOUNTER — Encounter: Payer: Self-pay | Admitting: Family Medicine

## 2022-10-07 ENCOUNTER — Other Ambulatory Visit: Payer: Self-pay

## 2022-10-07 MED ORDER — ACCU-CHEK GUIDE VI STRP: ORAL_STRIP | 12 refills | Status: AC

## 2022-10-07 NOTE — Telephone Encounter (Signed)
Patients nurse navigator calls nurse line reporting difficulty obtaining test strips.   She reports she was only given #30 at last refill.   She reports using Accu Chek Guide Meter.   Will send to PCP to send to [facility].

## 2022-10-15 ENCOUNTER — Other Ambulatory Visit: Payer: Self-pay | Admitting: Family Medicine

## 2022-10-15 ENCOUNTER — Other Ambulatory Visit: Payer: Self-pay

## 2022-10-15 MED ORDER — AMLODIPINE BESYLATE 10 MG PO TABS: 10.0000 mg | ORAL_TABLET | Freq: Every day | ORAL | 0 refills | Status: DC

## 2022-11-02 ENCOUNTER — Other Ambulatory Visit: Payer: Self-pay | Admitting: Family Medicine

## 2022-11-02 DIAGNOSIS — I159 Secondary hypertension, unspecified: Secondary | ICD-10-CM

## 2022-11-02 DIAGNOSIS — E1122 Type 2 diabetes mellitus with diabetic chronic kidney disease: Secondary | ICD-10-CM

## 2022-11-11 ENCOUNTER — Other Ambulatory Visit: Payer: Self-pay | Admitting: Family Medicine

## 2022-12-02 ENCOUNTER — Ambulatory Visit: Payer: Medicare Other | Admitting: Family Medicine

## 2022-12-07 ENCOUNTER — Other Ambulatory Visit: Payer: Self-pay | Admitting: Family Medicine

## 2022-12-07 DIAGNOSIS — E1122 Type 2 diabetes mellitus with diabetic chronic kidney disease: Secondary | ICD-10-CM

## 2022-12-14 ENCOUNTER — Ambulatory Visit (INDEPENDENT_AMBULATORY_CARE_PROVIDER_SITE_OTHER): Payer: Medicare Other | Admitting: Student

## 2022-12-14 ENCOUNTER — Encounter: Payer: Self-pay | Admitting: Student

## 2022-12-14 VITALS — BP 136/62 | HR 90 | Wt 201.6 lb

## 2022-12-14 DIAGNOSIS — R42 Dizziness and giddiness: Secondary | ICD-10-CM | POA: Diagnosis not present

## 2022-12-14 NOTE — Progress Notes (Signed)
  SUBJECTIVE:   CHIEF COMPLAINT / HPI:   Dizziness: I've been dizzy for 2 days on/off (total 3 occurrences). Says it worsens when she lays her head back or stands up. Denies sensation of room spinning. Dizziness lasts maybe 5 min. Denies episode occurrence during quick motion changes. However, if she turns over too much it comes on. Denies nausea/vomiting with these episodes. Keeping still improves the symptoms, but she feels unstable during these episodes. Denies accompanying double vision or headaches. States she used to have these episodes 7 years ago but they never found out what it was. She does get vertigo and states these episodes are not vertigo. She is unsure what her glucose or blood pressure was during these times. She does know during dialysis when her blood pressure gets low. Denies feeling as if she will pass out or fall over, denies palpitations.   PERTINENT  PMH / PSH: ESRD on HD, secondary hyperparathyroidism, HTN, T2DM, GERD  OBJECTIVE:  BP 136/62   Pulse 90   Wt 201 lb 9.6 oz (91.4 kg)   LMP 03/07/2019   SpO2 98%   BMI 31.58 kg/m  General: Awake, alert, NAD Neuro: Appropriate speech and moves extremities appropriately, legally blind in both eyes CV: RRR, no murmurs auscultated Pulm: Normal WOB  ASSESSMENT/PLAN:  Dizziness Assessment & Plan: Infrequent dizziness episodes without vertiginous symptoms and overall general lack of any accompanying symptoms.  Physical exam at this time is unremarkable and I have been unable to reproduce symptoms.  At this time mostly considering hypoglycemia and orthostatic hypotension.  Discussed red flag precautions with patient.  Opted to avoid further polypharmacy at this time given fragility of patient's medical history.  Return as needed for further onset.  Patient was amenable with this plan.   Return if symptoms worsen or fail to improve. [name], DO 12/14/2022, 12:47 PM PGY-2, [facility]

## 2022-12-14 NOTE — Assessment & Plan Note (Signed)
Infrequent dizziness episodes without vertiginous symptoms and overall general lack of any accompanying symptoms.  Physical exam at this time is unremarkable and I have been unable to reproduce symptoms.  At this time mostly considering hypoglycemia and orthostatic hypotension.  Discussed red flag precautions with patient.  Opted to avoid further polypharmacy at this time given fragility of patient's medical history.  Return as needed for further onset.  Patient was amenable with this plan.

## 2022-12-14 NOTE — Patient Instructions (Signed)
It was great to see you today! Thank you for choosing [facility] for your primary care. [name] was seen for dizziness.  Today we addressed: Dizziness: Given your lack of further symptoms and minimal occurrence, I mostly suspect this is related to quick blood pressure or changes in your glucose.  I would recommend when this episode happens next, check your sugar and blood pressure.  If those are low, that may be the source.  Should you have any events of loss of consciousness or further symptoms of intractable headache or nausea/vomiting, return for care. You have an appointment with Dr. Joeseph Amor in February for your follow-up care.  If you haven't already, sign up for My Chart to have easy access to your labs results, and communication with your primary care physician.  Call the clinic at [phone] if your symptoms worsen or you have any concerns.  You should return to our clinic Return if symptoms worsen or fail to improve. Please arrive 15 minutes before your appointment to ensure smooth check in process.  We appreciate your efforts in making this happen.  Thank you for allowing me to participate in your care, [name], DO 12/14/2022, 10:23 AM PGY-2, [place]

## 2022-12-24 ENCOUNTER — Other Ambulatory Visit: Payer: Self-pay | Admitting: Family Medicine

## 2022-12-24 DIAGNOSIS — E1122 Type 2 diabetes mellitus with diabetic chronic kidney disease: Secondary | ICD-10-CM

## 2022-12-24 DIAGNOSIS — I159 Secondary hypertension, unspecified: Secondary | ICD-10-CM

## 2022-12-28 ENCOUNTER — Other Ambulatory Visit: Payer: Self-pay | Admitting: Family Medicine

## 2022-12-30 ENCOUNTER — Encounter (HOSPITAL_COMMUNITY): Payer: Self-pay

## 2023-01-05 NOTE — Progress Notes (Unsigned)
  SUBJECTIVE:   CHIEF COMPLAINT / HPI:   T2DM f/u -Currently prescribed Lantus 10 units twice daily ***  PERTINENT  PMH / PSH: ***  Past Medical History:  Diagnosis Date   Alcohol dependency ([place])    Anemia    Appendicitis 12/06/2018   Chronic kidney disease    Diabetes mellitus    type 2   Diabetes mellitus type 2, uncontrolled    End stage renal disease ([place]) 05/05/2016   Starting 04/2016, LUE fistula   Enterococcal bacteremia    Hypertension    Legally blind in left eye, as defined in [place]    Liver abscess 12/20/2018   Necrotizing fasciitis ([place])    PONV (postoperative nausea and vomiting)    Sepsis without acute organ dysfunction ([place])    Type 2 diabetes mellitus with diabetic chronic kidney disease ([place]) 05/05/2016    OBJECTIVE:  LMP 03/07/2019   General: NAD, pleasant, able to participate in exam Cardiac: RRR, no murmurs auscultated Respiratory: CTAB, normal WOB Abdomen: soft, non-tender, non-distended, normoactive bowel sounds Extremities: warm and well perfused, no edema or cyanosis Skin: warm and dry, no rashes noted Neuro: alert, no obvious focal deficits, speech normal Psych: Normal affect and mood  ASSESSMENT/PLAN:   There are no diagnoses linked to this encounter. No orders of the defined types were placed in this encounter.  No follow-ups on file.  August Albino, MD [facility]

## 2023-01-06 ENCOUNTER — Encounter: Payer: Self-pay | Admitting: Family Medicine

## 2023-01-06 ENCOUNTER — Ambulatory Visit (INDEPENDENT_AMBULATORY_CARE_PROVIDER_SITE_OTHER): Payer: 59 | Admitting: Family Medicine

## 2023-01-06 VITALS — BP 181/74 | HR 84 | Ht 67.0 in | Wt 199.4 lb

## 2023-01-06 DIAGNOSIS — Z992 Dependence on renal dialysis: Secondary | ICD-10-CM | POA: Diagnosis not present

## 2023-01-06 DIAGNOSIS — Z0000 Encounter for general adult medical examination without abnormal findings: Secondary | ICD-10-CM

## 2023-01-06 DIAGNOSIS — Z794 Long term (current) use of insulin: Secondary | ICD-10-CM | POA: Diagnosis not present

## 2023-01-06 DIAGNOSIS — I159 Secondary hypertension, unspecified: Secondary | ICD-10-CM | POA: Diagnosis not present

## 2023-01-06 DIAGNOSIS — E1122 Type 2 diabetes mellitus with diabetic chronic kidney disease: Secondary | ICD-10-CM | POA: Diagnosis not present

## 2023-01-06 DIAGNOSIS — N186 End stage renal disease: Secondary | ICD-10-CM | POA: Diagnosis not present

## 2023-01-06 LAB — POCT GLYCOSYLATED HEMOGLOBIN (HGB A1C): HbA1c, POC (controlled diabetic range): 6.7 % (ref 0.0–7.0)

## 2023-01-06 MED ORDER — AMLODIPINE BESYLATE 10 MG PO TABS: 10.0000 mg | ORAL_TABLET | Freq: Every day | ORAL | 0 refills | Status: DC

## 2023-01-06 MED ORDER — LOSARTAN POTASSIUM 50 MG PO TABS: 50.0000 mg | ORAL_TABLET | Freq: Every day | ORAL | 0 refills | Status: DC

## 2023-01-06 NOTE — Assessment & Plan Note (Addendum)
A1c 6.7. Unable to check home CBGs, has been taking Lantus 10u daily. Checking A1c today. Denies hypoglycemic episodes. -Check home CBGs, follow up in 1-2 weeks for titration

## 2023-01-06 NOTE — Assessment & Plan Note (Addendum)
Phoslo not listed in home meds but states she already takes this - will ask her to bring all her meds with her next time

## 2023-01-06 NOTE — Patient Instructions (Addendum)
It was wonderful to see you today.  Please bring ALL of your medications with you to every visit.   Updates from today's visit:  Please take your amlodipine and losartan daily  Please take your hydralazine 3 times a day  Please continue taking your insulin  Please check your sugars every morning and blood pressure at least once a day  You should receive a call about setting up a colonoscopy  Please follow up in 1-2 weeks  Thank you for choosing [facility].   Please call [phone] with any questions about today's appointment.  Please be sure to schedule follow up at the front  desk before you leave today.   [name] Albino, MD  Family Medicine       Blood Pressure Record Sheet To take your blood pressure, you will need a blood pressure machine. You can buy a blood pressure machine (blood pressure monitor) at your clinic, drug store, or online. When choosing one, consider: An automatic monitor that has an arm cuff. A cuff that wraps snugly around your upper arm. You should be able to fit only one finger between your arm and the cuff. A device that stores blood pressure reading results. Do not choose a monitor that measures your blood pressure from your wrist or finger. Follow your health care provider's instructions for how to take your blood pressure. To use this form: Take your blood pressure medications every day These measurements should be taken when you have been at rest for at least 10-15 min Take at least 2 readings with each blood pressure check. This makes sure the results are correct. Wait 1-2 minutes between measurements. Write down the results in the spaces on this form. Keep in mind it should always be recorded systolic over diastolic. Both numbers are important.  Repeat this every day for 2-3 weeks, or as told by your health care provider.  Make a follow-up appointment with your health care provider to discuss the results.  Blood Pressure  Log Date Medications taken? (Y/N) Blood Pressure Time of Day

## 2023-01-06 NOTE — Assessment & Plan Note (Signed)
BP elevated in clinic today. Has been out of amlodipine for several weeks.  Has also not been taking her medications as prescribed.  Will try to check her BP at home. -Continue amlodipine 10 mg daily (sent refill) -Continue losartan 50 mg daily (changed to daily instead of nightly) -Continue hydralazine 10 mg 3 times daily -Follow-up in 1 to 2 weeks for titration

## 2023-01-11 ENCOUNTER — Telehealth: Payer: Self-pay

## 2023-01-11 NOTE — Telephone Encounter (Signed)
Patient calls nurse line regarding continued elevated blood pressure readings.   She reports that when she begins dialysis (M, W, F) she will have BP of 947 systolic, it will eventually come down to 096'G systolic.   Had headache earlier this morning, this has already resolved. Denies visual changes, chest pain or shortness of breath.   She has not checked BP today. She states that boyfriend will check her BP later today. He is not currently at home with her. Advised that patient return call to office with today's BP.   She states that she has been eating a lot of oodles of noodles lately and is wondering if salty foods could contribute to elevated BP readings. Advised patient to limit consumption of deep fried foods and foods high in sodium. She verbalizes understanding.   ED precautions discussed. Offered to schedule appointment in clinic today to further discuss. Patient unable to come today due to needing three days for transportation. She is scheduled for follow up with PCP on 01/20/23.   Please advise any additional recommendations for BP management.   Talbot Grumbling, RN

## 2023-01-19 ENCOUNTER — Encounter: Payer: Self-pay | Admitting: Nurse Practitioner

## 2023-01-20 ENCOUNTER — Encounter: Payer: Self-pay | Admitting: Family Medicine

## 2023-01-20 ENCOUNTER — Ambulatory Visit (INDEPENDENT_AMBULATORY_CARE_PROVIDER_SITE_OTHER): Payer: 59 | Admitting: Family Medicine

## 2023-01-20 VITALS — BP 173/78 | HR 91 | Ht 67.0 in | Wt 202.0 lb

## 2023-01-20 DIAGNOSIS — K219 Gastro-esophageal reflux disease without esophagitis: Secondary | ICD-10-CM | POA: Diagnosis not present

## 2023-01-20 DIAGNOSIS — E1142 Type 2 diabetes mellitus with diabetic polyneuropathy: Secondary | ICD-10-CM | POA: Diagnosis not present

## 2023-01-20 DIAGNOSIS — I159 Secondary hypertension, unspecified: Secondary | ICD-10-CM | POA: Diagnosis not present

## 2023-01-20 DIAGNOSIS — Z992 Dependence on renal dialysis: Secondary | ICD-10-CM

## 2023-01-20 DIAGNOSIS — N186 End stage renal disease: Secondary | ICD-10-CM

## 2023-01-20 DIAGNOSIS — E1122 Type 2 diabetes mellitus with diabetic chronic kidney disease: Secondary | ICD-10-CM

## 2023-01-20 DIAGNOSIS — Z794 Long term (current) use of insulin: Secondary | ICD-10-CM

## 2023-01-20 MED ORDER — AMLODIPINE BESYLATE 10 MG PO TABS: 10.0000 mg | ORAL_TABLET | Freq: Every day | ORAL | 0 refills | Status: DC

## 2023-01-20 MED ORDER — OMEPRAZOLE 20 MG PO CPDR: 20.0000 mg | DELAYED_RELEASE_CAPSULE | Freq: Every day | ORAL | 0 refills | Status: DC

## 2023-01-20 MED ORDER — LANTUS SOLOSTAR 100 UNIT/ML ~~LOC~~ SOPN: 10.0000 [IU] | PEN_INJECTOR | Freq: Every day | SUBCUTANEOUS | 0 refills | Status: DC

## 2023-01-20 MED ORDER — GABAPENTIN 100 MG PO CAPS: 100.0000 mg | ORAL_CAPSULE | Freq: Three times a day (TID) | ORAL | 0 refills | Status: DC

## 2023-01-20 NOTE — Assessment & Plan Note (Signed)
Well-controlled on current regimen, continue Lantus 10 units daily, follow-up in 6 months

## 2023-01-20 NOTE — Assessment & Plan Note (Signed)
Will continue current regimen given that she is on dialysis, will not increase her losartan at this time.  Recommend that she continue to follow-up with her nephrologist who may have further recommendations about BP control

## 2023-01-20 NOTE — Patient Instructions (Signed)
It was wonderful to see you today.  Please bring ALL of your medications with you to every visit.   Updates from today's visit:  I have sent in refills for your gabapentin, amlodipine, prilosec, and insulin  Please ask your kidney doctor whether they want to change any of your blood pressure medications  Please follow up in 6 months   Thank you for choosing [facility].   Please call [phone] with any questions about today's appointment.  Please be sure to schedule follow up at the front  desk before you leave today.   August Albino, MD  Family Medicine

## 2023-01-20 NOTE — Progress Notes (Signed)
  SUBJECTIVE:   CHIEF COMPLAINT / HPI:   HTN -At last visit, continued on amlodipine 10 mg daily, losartan 50 mg daily, hydralazine 10 mg 3 times daily.  She reports compliance with this regimen. Denies severe headaches, vision changes, dizziness/syncope -Home BP ranges from 140s-180s/80s -Typically does well in dialysis sessions, although she states her BP has been a concern in the past   DM -Currently taking Lantus 10u daily without issues -Home CBGs 100s-150s  Requesting refills on gabapentin, Prilosec, amlodipine, Lantus   PERTINENT  PMH / PSH: HTN, DM, ESRD on HD m/w/f   OBJECTIVE:  BP (!) 173/78   Pulse 91   Ht 5\' 7"  (1.702 m)   Wt 202 lb (91.6 kg)   LMP 03/07/2019   SpO2 100%   BMI 31.64 kg/m   General: NAD, wheelchair bound, pleasant Cardiac: RRR, no murmurs auscultated Respiratory: CTAB, normal WOB Abdomen: soft, non-tender, non-distended  ASSESSMENT/PLAN:   Type 2 diabetes mellitus with chronic kidney disease on chronic dialysis, with long-term current use of insulin (HCC) Assessment & Plan: Well-controlled on current regimen, continue Lantus 10 units daily, follow-up in 6 months  Orders: -     Lantus SoloStar; Inject 10 Units into the skin daily.  Dispense: 15 mL; Refill: 0  Secondary hypertension, unspecified Assessment & Plan: Will continue current regimen given that she is on dialysis, will not increase her losartan at this time.  Recommend that she continue to follow-up with her nephrologist who may have further recommendations about BP control  Orders: -     amLODIPine Besylate; Take 1 tablet (10 mg total) by mouth daily.  Dispense: 30 tablet; Refill: 0  Gastroesophageal reflux disease, unspecified whether esophagitis present -     Omeprazole; Take 1 capsule (20 mg total) by mouth daily.  Dispense: 90 capsule; Refill: 0  Diabetic peripheral neuropathy (HCC) -     Gabapentin; Take 1 capsule (100 mg total) by mouth 3 (three) times daily.  Dispense:  270 capsule; Refill: 0   Meds ordered this encounter  Medications   LANTUS SOLOSTAR 100 UNIT/ML Solostar Pen    Sig: Inject 10 Units into the skin daily.    Dispense:  15 mL    Refill:  0   amLODipine (NORVASC) 10 MG tablet    Sig: Take 1 tablet (10 mg total) by mouth daily.    Dispense:  30 tablet    Refill:  0   omeprazole (PRILOSEC) 20 MG capsule    Sig: Take 1 capsule (20 mg total) by mouth daily.    Dispense:  90 capsule    Refill:  0   gabapentin (NEURONTIN) 100 MG capsule    Sig: Take 1 capsule (100 mg total) by mouth 3 (three) times daily.    Dispense:  270 capsule    Refill:  0   Return in about 6 months (around 07/21/2023) for diabetes.  August Albino, MD [facility]

## 2023-01-31 ENCOUNTER — Telehealth: Payer: Self-pay

## 2023-01-31 NOTE — Telephone Encounter (Signed)
Patient calls nurse line reporting high blood pressure at dialysis.   She reports today at dialysis her BP was "190." She reports this is all she can remember. She was told to contact her PCP for further evaluation.   She reports compliance with her BP medications. However, she has not taken Amlodipine. She reports she has been "doubling up on Losartan." She reports she has been unable to pick up Amlodipine from the pharmacy. I called the pharmacy and they report the medication is ready. I attempted to call patient to inform, however no answer.   She denies any headaches, vision changes, SOB or chest pains.   Patient scheduled for 2/29 with PCP. ED precautions given.   Will attempt to continue to reach patient to pick up Amlodipine.

## 2023-02-03 ENCOUNTER — Ambulatory Visit (INDEPENDENT_AMBULATORY_CARE_PROVIDER_SITE_OTHER): Payer: 59 | Admitting: Family Medicine

## 2023-02-03 ENCOUNTER — Encounter: Payer: Self-pay | Admitting: Family Medicine

## 2023-02-03 VITALS — BP 161/74 | HR 86 | Ht 67.0 in

## 2023-02-03 DIAGNOSIS — I159 Secondary hypertension, unspecified: Secondary | ICD-10-CM | POA: Diagnosis not present

## 2023-02-03 NOTE — Patient Instructions (Addendum)
Please contact your nephrologist (kidney doctor) to schedule an appointment to discuss your blood pressure medicines  Please give our office a call at [phone] if you have any issues with your medications or have any concerns/questions about today's appointment  [name] Albino, MD

## 2023-02-03 NOTE — Progress Notes (Signed)
  SUBJECTIVE:   CHIEF COMPLAINT / HPI:   Here for hypertension follow-up due to concern that her blood pressure was elevated at dialysis to 190. However, this was before her dialysis session started. After the session, went down to about 160.  -At last visit, was continued on current regimen of amlodipine 10 mg daily, losartan 50 mg daily, hydralazine 10 mg 3 times daily -She reports compliance with this regimen, denies severe headaches, vision changes, dizziness/syncope    PERTINENT  PMH / PSH:   Hypertension, ESRD on HD m/w/f   OBJECTIVE:  BP (!) 153/73   Pulse 86   Ht 5' 7"$  (1.702 m)   LMP 03/07/2019   SpO2 100%   BMI 31.64 kg/m   General: NAD, pleasant, wheelchair-bound Cardiac: RRR, no murmurs auscultated Respiratory: CTAB, normal WOB Abd: Soft, NT/ND  ASSESSMENT/PLAN:   Secondary hypertension, unspecified Assessment & Plan: Continue current regimen, BP stable in office today.  Advised to speak to her nephrologist about further changes to her blood pressure medications given that she is ESRD on dialysis     Return in about 6 months (around 08/04/2023) for Diabetes, hypertension.  August Albino, MD [facility]

## 2023-02-03 NOTE — Assessment & Plan Note (Signed)
Continue current regimen, BP stable in office today.  Advised to speak to her nephrologist about further changes to her blood pressure medications given that she is ESRD on dialysis

## 2023-02-22 ENCOUNTER — Ambulatory Visit: Payer: 59 | Admitting: Nurse Practitioner

## 2023-02-22 NOTE — Progress Notes (Deleted)
Assessment:         Plan:          History of Present Illness   Chief Complaint:    [name] is a 57 y.o. year old female known to Dr.  Henrene Pastor with a past medical history of ESRD on HD, right BKA , DM, chronic constipation, .  See PMH / [place] for additional history.   Patient was seen in the office October 2021 for colon cancer screening/evaluation of anemia.  Iron studies were not suggestive of iron deficiency.  Colonoscopy recommended, it was not done ( ? Reason)   Previous GI history:         Previous Labs / Imaging::    Latest Ref Rng & Units 03/20/2020   12:23 PM 09/18/2019    5:11 PM 12/26/2018    6:37 AM  CBC  WBC 3.4 - 10.8 x10E3/uL 7.3  10.6  22.2   Hemoglobin 11.1 - 15.9 g/dL 11.3  11.4  9.0   Hematocrit 34.0 - 46.6 % 37.1  35.8  29.5   Platelets 150 - 450 x10E3/uL 211  299  354     Lab Results  Component Value Date   LIPASE 60 (H) 12/19/2018      Latest Ref Rng & Units 08/19/2022   10:07 AM 09/01/2021    9:42 AM 03/20/2020   12:23 PM  CMP  Glucose 70 - 99 mg/dL 221  177  100   BUN 6 - 24 mg/dL 44  60  46   Creatinine 0.57 - 1.00 mg/dL 7.59  9.05  6.48   Sodium 134 - 144 mmol/L 138  141  140   Potassium 3.5 - 5.2 mmol/L 5.0  4.5  5.3   Chloride 96 - 106 mmol/L 92  93  93   CO2 20 - 29 mmol/L 28  18  26    Calcium 8.7 - 10.2 mg/dL 9.7  9.3  10.0   Total Protein 6.0 - 8.5 g/dL  7.5    Total Bilirubin 0.0 - 1.2 mg/dL  0.3    Alkaline Phos 44 - 121 IU/L  86    AST 0 - 40 IU/L  15    ALT 0 - 32 IU/L  7            Imaging:  MM 3D SCREEN BREAST BILATERAL CLINICAL DATA:  Screening.  EXAM: DIGITAL SCREENING BILATERAL MAMMOGRAM WITH TOMOSYNTHESIS AND CAD  TECHNIQUE: Bilateral screening digital craniocaudal and mediolateral oblique mammograms were obtained. Bilateral screening digital breast tomosynthesis was performed. The images were evaluated with computer-aided detection.  COMPARISON:  Previous exam(s).  ACR Breast  Density Category c: The breast tissue is heterogeneously dense, which may obscure small masses.  FINDINGS: There are no findings suspicious for malignancy.  IMPRESSION: No mammographic evidence of malignancy. A result letter of this screening mammogram will be mailed directly to the patient.  RECOMMENDATION: Screening mammogram in one year. (Code:[id number])  BI-RADS CATEGORY  1: Negative.  Electronically Signed   By: Lovey Newcomer M.D.   On: 09/22/2022 06:34    Past Medical History:  Diagnosis Date   Alcohol dependency ([place])    Anemia    Appendicitis 12/06/2018   Chronic kidney disease    Diabetes mellitus    type 2   Diabetes mellitus type 2, uncontrolled    End stage renal disease ([place]) 05/05/2016   Starting 04/2016, LUE fistula   Enterococcal bacteremia    Hypertension  Legally blind in left eye, as defined in [place]    Liver abscess 12/20/2018   Necrotizing fasciitis ([place])    PONV (postoperative nausea and vomiting)    Sepsis without acute organ dysfunction ([place])    Type 2 diabetes mellitus with diabetic chronic kidney disease ([place]) 05/05/2016   Past Surgical History:  Procedure Laterality Date   AMPUTATION     right first and left middle finger amputation 2/2 OM 08/2011 by Dr. Amedeo Plenty   AMPUTATION  01/18/2012   Procedure: AMPUTATION DIGIT;  Surgeon: Tennis Must, MD;  Location: [place];  Service: Orthopedics;  Laterality: Left;  revision amputation left thumb   AMPUTATION  08/25/2012   Procedure: AMPUTATION BELOW KNEE;  Surgeon: Newt Minion, MD;  Location: [place];  Service: Orthopedics;  Laterality: Right;  Right Below Knee Amputation   APPENDECTOMY     BASCILIC VEIN TRANSPOSITION Left 07/22/2015   Procedure: BASILIC VEIN TRANSPOSITION ;  Surgeon: Elam Dutch, MD;  Location: [facility] OR;  Service: Vascular;  Laterality: Left;   CATARACT EXTRACTION     right    [place]   I & D EXTREMITY   12/24/2011   Procedure: IRRIGATION AND DEBRIDEMENT EXTREMITY;  Surgeon: Tennis Must, MD;  Location: [place];  Service: Orthopedics;  Laterality: Left;  I&Dleft thumb with partial amputation   I & D EXTREMITY  12/26/2011   Procedure: IRRIGATION AND DEBRIDEMENT EXTREMITY;  Surgeon: Tennis Must, MD;  Location: [place];  Service: Orthopedics;  Laterality: Left;   I & D EXTREMITY  12/23/2011   Procedure: IRRIGATION AND DEBRIDEMENT EXTREMITY;  Surgeon: Tennis Must, MD;  Location: [place];  Service: Orthopedics;  Laterality: Left;  I&D Left thumb, hand and arm   I&D left thumb  12/23/2011   IR RADIOLOGIST EVAL & MGMT  01/04/2019   IR RADIOLOGIST EVAL & MGMT  01/25/2019   LAPAROSCOPIC APPENDECTOMY N/A 12/06/2018   Procedure: APPENDECTOMY LAPAROSCOPIC;  Surgeon: Mickeal Skinner, MD;  Location: [place];  Service: General;  Laterality: N/A;   TUBAL LIGATION     Family History  Problem Relation Age of Onset   Diabetes Mother    Alcohol abuse Mother    Diabetes Father    Alzheimer's disease Father    Hypertension Father    Kidney failure Sister    Diabetes Sister    High blood pressure Sister    Social History   Tobacco Use   Smoking status: Never   Smokeless tobacco: Never  Vaping Use   Vaping Use: Never used  Substance Use Topics   Alcohol use: Yes    Comment: h/o alcohol abuse 4 years ago (07/21/15)   Drug use: Yes    Types: Cocaine    Comment:  last used cocaine 4 years ago (07/21/15)   Current Outpatient Medications  Medication Sig Dispense Refill   amLODipine (NORVASC) 10 MG tablet Take 1 tablet (10 mg total) by mouth daily. 30 tablet 0   atorvastatin (LIPITOR) 40 MG tablet Take 1 tablet (40 mg total) by mouth daily. 90 tablet 0   B Complex-C-Folic Acid (DIALYVITE Q000111Q) 0.8 MG TABS Take 1 tablet by mouth daily.   11   blood glucose meter kit and supplies KIT Dispense based on patient and insurance preference. Use up to four times daily as directed. 1 each 0   blood glucose meter kit and  supplies  Dispense based on patient and insurance preference. Use up to four times daily as directed. (FOR ICD-10 E10.9, E11.9). 1 each 0   Blood Pressure Monitoring (ADULT BLOOD PRESSURE CUFF LG) KIT 1 each by Does not apply route daily. 1 kit 0   ferric citrate (AURYXIA) 1 GM 210 MG(Fe) tablet      gabapentin (NEURONTIN) 100 MG capsule Take 1 capsule (100 mg total) by mouth 3 (three) times daily. 270 capsule 0   glucose blood (ACCU-CHEK GUIDE) test strip User to check blood sugar up to 4x per day e11.9 100 each 12   hydrALAZINE (APRESOLINE) 10 MG tablet TAKE 1 TABLET (10 MG TOTAL) BY MOUTH 3 (THREE) TIMES DAILY. 270 tablet 0   Insulin Pen Needle (EASY TOUCH PEN NEEDLES) 32G X 4 MM MISC 1 each by Does not apply route 3 (three) times daily. 100 each 11   Lancet Devices (ONETOUCH DELICA PLUS LANCING) MISC CHECK BLOOD SUGARS FOURX DAILY 1 each 0   Lancets (ONETOUCH DELICA PLUS 123XX123) MISC USE TO TEST BLOOD SUGAR AS DIRECTED 100 each 12   LANTUS SOLOSTAR 100 UNIT/ML Solostar Pen Inject 10 Units into the skin daily. 15 mL 0   losartan (COZAAR) 50 MG tablet Take 1 tablet (50 mg total) by mouth daily. 90 tablet 0   omeprazole (PRILOSEC) 20 MG capsule Take 1 capsule (20 mg total) by mouth daily. 90 capsule 0   No current facility-administered medications for this visit.   No Known Allergies   Review of Systems: Positive for ***.  All other systems reviewed and negative except where noted in HPI.   Wt Readings from Last 3 Encounters:  01/20/23 202 lb (91.6 kg)  01/06/23 199 lb 6.4 oz (90.4 kg)  12/14/22 201 lb 9.6 oz (91.4 kg)    Physical Exam:  LMP 03/07/2019  Constitutional:  Pleasant, generally well appearing ***female in no acute distress. Psychiatric:  Normal mood and affect. Behavior is normal. EENT: Pupils normal.  Conjunctivae are normal. No scleral icterus. Neck supple.  Cardiovascular: Normal rate, regular rhythm.  Pulmonary/chest: Effort normal and breath sounds normal. No  wheezing, rales or rhonchi. Abdominal: Soft, nondistended, nontender. Bowel sounds active throughout. There are no masses palpable. No hepatomegaly. Neurological: Alert and oriented to person place and time. Extremities: *** No edema Skin: Skin is warm and dry. No rashes noted.  Tye Savoy, NP  02/22/2023, 8:46 AM  Cc:  Referring Provider August Albino, MD

## 2023-02-28 ENCOUNTER — Other Ambulatory Visit: Payer: Self-pay | Admitting: Family Medicine

## 2023-02-28 DIAGNOSIS — I159 Secondary hypertension, unspecified: Secondary | ICD-10-CM

## 2023-03-15 ENCOUNTER — Other Ambulatory Visit: Payer: Self-pay | Admitting: Family Medicine

## 2023-03-22 ENCOUNTER — Other Ambulatory Visit: Payer: Self-pay | Admitting: Family Medicine

## 2023-03-22 DIAGNOSIS — I159 Secondary hypertension, unspecified: Secondary | ICD-10-CM

## 2023-03-29 ENCOUNTER — Other Ambulatory Visit: Payer: Self-pay | Admitting: Family Medicine

## 2023-03-29 DIAGNOSIS — E1142 Type 2 diabetes mellitus with diabetic polyneuropathy: Secondary | ICD-10-CM

## 2023-03-29 DIAGNOSIS — K219 Gastro-esophageal reflux disease without esophagitis: Secondary | ICD-10-CM

## 2023-04-04 NOTE — Progress Notes (Unsigned)
    SUBJECTIVE:   CHIEF COMPLAINT / HPI:   Acid reflux/globus sensation Reports sensation of feeling like food is getting stuck in her epigastric region x 2 weeks. States it happens every time after she eats. Normally tries to drink water but it does not really help. Takes Omeprazole 20mg  daily for acid reflux, feels it helps but reflux may be contributing. Daily soft BM. Denies RUQ pain. Denies fever, weight loss.  Hypertension: - Medications: Coreg BID, Hydralazine 10mg  TID, Losartan 50mg , Amlodipine 10mg  - Compliance: Yes - reports difficulty taking so many different pills every day - Checking BP at home: 150s systolic - Denies any SOB, CP, vision changes, LE edema, medication SEs, or symptoms of hypotension  PERTINENT  PMH / PSH: HTN, ESRD, T2DM  OBJECTIVE:   BP 130/60   Pulse 78   Wt 195 lb 6.4 oz (88.6 kg)   LMP 03/07/2019   SpO2 96%   BMI 30.60 kg/m    General: NAD, pleasant, able to participate in exam Cardiac: RRR, no murmurs. Respiratory: CTAB, normal effort, No wheezes, rales or rhonchi Abdomen: Bowel sounds present, nontender, nondistended Extremities: No edema or cyanosis. Skin: Warm and dry, no rashes noted Neuro: Alert, no obvious focal deficits Psych: Normal affect and mood  ASSESSMENT/PLAN:   Epigastric pain Globus sensation in epigastric region present every time after eating x 2 weeks. H/o GERD, on Omeprazole 20mg  with mild symptom improvement. Denies N/V, fever, weight loss. CT in 2020 showed small hiatal hernia. Possibly hiatal hernia vs GERD vs esophageal stenosis. -Increase to Omeprazole 40mg  daily -Referral to GI for possible EGD for further evaluation  Secondary hypertension, unspecified Compliant on Amlodipine 10mg , Losartan 50mg , Hydralazine 10mg  TID and Coreg 25mg  (patient reported). Difficulty sorting medications, legally blind. Discussed pill packs and patient interested. -Switch to Amlodipine 10mg -Olmesartan 20mg  combination pill to increase  compliance -Discussed case with Dr. Raymondo Band (Pharmacy), he set up pill packets with patient pharmacy -F/u 1 month for BP check   Dr. Elberta Fortis, DO [facility]

## 2023-04-05 ENCOUNTER — Ambulatory Visit (INDEPENDENT_AMBULATORY_CARE_PROVIDER_SITE_OTHER): Payer: 59 | Admitting: Family Medicine

## 2023-04-05 ENCOUNTER — Telehealth: Payer: Self-pay | Admitting: Pharmacist

## 2023-04-05 ENCOUNTER — Encounter: Payer: Self-pay | Admitting: Family Medicine

## 2023-04-05 VITALS — BP 130/60 | HR 78 | Wt 195.4 lb

## 2023-04-05 DIAGNOSIS — R1013 Epigastric pain: Secondary | ICD-10-CM

## 2023-04-05 DIAGNOSIS — K219 Gastro-esophageal reflux disease without esophagitis: Secondary | ICD-10-CM

## 2023-04-05 DIAGNOSIS — I159 Secondary hypertension, unspecified: Secondary | ICD-10-CM

## 2023-04-05 MED ORDER — AMLODIPINE-OLMESARTAN 10-20 MG PO TABS
1.0000 | ORAL_TABLET | Freq: Every day | ORAL | 0 refills | Status: DC
Start: 2023-04-05 — End: 2023-06-01

## 2023-04-05 MED ORDER — OMEPRAZOLE 40 MG PO CPDR
40.0000 mg | DELAYED_RELEASE_CAPSULE | Freq: Every day | ORAL | 3 refills | Status: DC
Start: 2023-04-05 — End: 2023-07-12

## 2023-04-05 NOTE — Patient Instructions (Signed)
It was wonderful to see you today! Thank you for choosing [facility].   Please bring ALL of your medications with you to every visit.   Today we talked about:  I am increasing your Omeprazole to 40mg  daily. You can take two pill of your remaining medication and I sent the new prescription to the pharmacy. I am referring you to the GI doctor to discuss doing an endoscopy to further evaluate your pain.  I am also sending in a combination pill for your Amlodipine and Losartan to reduce the number of pill you take. I do think seeing our pharmacist to help with pill packaging could help you with your medications.  Please follow up in 1 month of BP check  Call the clinic at [phone] if your symptoms worsen or you have any concerns.  Please be sure to schedule follow up at the front desk before you leave today.   Elberta Fortis, DO Family Medicine

## 2023-04-05 NOTE — Telephone Encounter (Signed)
Discussed option of pill-packing for this patient with Dr. Ardyth Harps who had a visit with this patient today.   Discussed use of [facility] - which is where patient is currently receiving medications.  They do this service.   Contacted patient to share this information and encouraged her to talk with her pharmacist at [facility] pharmacy to arrange details on the specific medication to pack and any specifics to her request.   She thanked me for the call and plans to contact [facility] RE pill packaging to improve medication adherence.

## 2023-04-06 DIAGNOSIS — R1013 Epigastric pain: Secondary | ICD-10-CM | POA: Insufficient documentation

## 2023-04-06 NOTE — Assessment & Plan Note (Addendum)
Compliant on Amlodipine 10mg , Losartan 50mg , Hydralazine 10mg  TID and Coreg 25mg  (patient reported). Difficulty sorting medications, legally blind. Discussed pill packs and patient interested. -Switch to Amlodipine 10mg -Olmesartan 20mg  combination pill to increase compliance -Discussed case with Dr. Raymondo Band (Pharmacy), he set up pill packets with patient pharmacy -F/u 1 month for BP check

## 2023-04-06 NOTE — Assessment & Plan Note (Addendum)
Globus sensation in epigastric region present every time after eating x 2 weeks. H/o GERD, on Omeprazole 20mg  with mild symptom improvement. Denies N/V, fever, weight loss. CT in 2020 showed small hiatal hernia. Possibly hiatal hernia vs GERD vs esophageal stenosis. -Increase to Omeprazole 40mg  daily -Referral to GI for possible EGD for further evaluation

## 2023-04-06 NOTE — Telephone Encounter (Signed)
Reviewed and agree with Dr Koval's plan.   

## 2023-04-28 ENCOUNTER — Other Ambulatory Visit: Payer: Self-pay | Admitting: Family Medicine

## 2023-06-01 ENCOUNTER — Other Ambulatory Visit: Payer: Self-pay

## 2023-06-01 DIAGNOSIS — I159 Secondary hypertension, unspecified: Secondary | ICD-10-CM

## 2023-06-01 MED ORDER — AMLODIPINE-OLMESARTAN 10-20 MG PO TABS
1.0000 | ORAL_TABLET | Freq: Every day | ORAL | 0 refills | Status: DC
Start: 2023-06-01 — End: 2023-09-14

## 2023-06-10 ENCOUNTER — Other Ambulatory Visit: Payer: Self-pay | Admitting: Family Medicine

## 2023-07-05 ENCOUNTER — Ambulatory Visit: Payer: 59 | Admitting: Nurse Practitioner

## 2023-07-05 ENCOUNTER — Encounter: Payer: Self-pay | Admitting: Nurse Practitioner

## 2023-07-05 DIAGNOSIS — R1310 Dysphagia, unspecified: Secondary | ICD-10-CM

## 2023-07-05 DIAGNOSIS — Z1211 Encounter for screening for malignant neoplasm of colon: Secondary | ICD-10-CM

## 2023-07-05 MED ORDER — NA SULFATE-K SULFATE-MG SULF 17.5-3.13-1.6 GM/177ML PO SOLN: 1.0000 | ORAL | 0 refills | Status: DC

## 2023-07-05 NOTE — Progress Notes (Signed)
Noted  

## 2023-07-05 NOTE — Progress Notes (Signed)
Primary GI:  Yancey Flemings, MD (Oct 2021)  ASSESSMENT & PLAN   57 y.o. yo female with a past medical history consisting of, but not necessarily limited to HTN, ESRD on HD MWF, chronic anemia, right BKA, DM2, remote Etoh abuse ( in remission), gangrenous appendicitis s/p appendectomy Jan 2020, hepatic abscess s/p IR drain placement in Jan 2020  Solid food dysphagia,  resolved nearly 3 months ago after increase in PPI dose. Etiology unclear, possibly 2/2 to esophagitis.  - Continue Prilosec 40 mg daily for now.  - Advised to contact me for any recurrent symptoms - If no recurrent symptoms over the next couple of months would recommend PCP attempt to reduce dose back to 20 mg daily and see how she does. At some point if she continues to do well then could consider changing to Pepcid or even try stopping reflux medication since she gives no history of reflux symptoms and cannot even remember why she takes it  Colon cancer screening No prior screening. She cancelled previously scheduled colonoscopy in 2021. No bowel changes. No known FMH of colon cancer -Schedule for a colonoscopy. The risks and benefits of colonoscopy with possible polypectomy / biopsies were discussed and the patient agrees to proceed.  -Procedure will be done at [facility] given that patient has ESRD on HD. Due to schedules / availability Dr. Russella Dar will perform the procedure   ESRD, on HD MWF  HPI   Brief GI History   Lyndse was last seen here October 2021,  at that time for colon cancer screening.  She was scheduled for colonoscopy but it appears she did not come for the procedure.  She was scheduled for an office visit in March 2024 but did not come for the appointment.  She has returned for evaluation of dysphagia which has already resolved.   Interval History  Chief complaint  Recent swallowing problems and epigastric pain  Several months ago [place] developed solid food dysphagia associated with epigastric discomfort. This went  on for a few weeks but then resolved after PCP increased Prilosec from 20 mg to 40 mg daily the end of April.  She hasn't had any problems swallowing  or pain since.  She can eat anything she wants without swallowing precautions. She tells me that she never had typical reflux symptoms and cannot really recall why she was ever started on Prilosec. Even though upper Gi issues have resolved she kept this appointment to discuss getting colonoscopy rescheduled.    Her weight is stable. No N/V, abdominal pain. She has occasional constipation but otherwise BMs are normal. She can't see well enough to know if she ever has blood in stool. No known FMH of colon cancer. She has no general medical complaints  Previous GI Studies  None  *No recent labs *No recent abdominal imaging  Past Medical History:  Diagnosis Date   Alcohol dependency ([facility])    Anemia    Appendicitis 12/06/2018   Chronic kidney disease    Diabetes mellitus    type 2   Diabetes mellitus type 2, uncontrolled    End stage renal disease ([facility]) 05/05/2016   Starting 04/2016, LUE fistula   Enterococcal bacteremia    Hypertension    Legally blind in left eye, as defined in [place]    Liver abscess 12/20/2018   Necrotizing fasciitis ([facility])    PONV (postoperative nausea and vomiting)    Sepsis without acute organ dysfunction ([facility])    Type 2 diabetes mellitus with diabetic  chronic kidney disease ([facility]) 05/05/2016   Past Surgical History:  Procedure Laterality Date   AMPUTATION     right first and left middle finger amputation 2/2 OM 08/2011 by Dr. Amanda Pea   AMPUTATION  01/18/2012   Procedure: AMPUTATION DIGIT;  Surgeon: Tami Ribas, MD;  Location: [facility];  Service: Orthopedics;  Laterality: Left;  revision amputation left thumb   AMPUTATION  08/25/2012   Procedure: AMPUTATION BELOW KNEE;  Surgeon: Nadara Mustard, MD;  Location: [facility];  Service: Orthopedics;  Laterality: Right;  Right Below Knee Amputation   APPENDECTOMY      BASCILIC VEIN TRANSPOSITION Left 07/22/2015   Procedure: BASILIC VEIN TRANSPOSITION ;  Surgeon: Sherren Kerns, MD;  Location: [facility] OR;  Service: Vascular;  Laterality: Left;   CATARACT EXTRACTION     right    CESAREAN SECTION  1986   CESAREAN SECTION  1985   CESAREAN SECTION  1991   I & D EXTREMITY  12/24/2011   Procedure: IRRIGATION AND DEBRIDEMENT EXTREMITY;  Surgeon: Tami Ribas, MD;  Location: [facility] OR;  Service: Orthopedics;  Laterality: Left;  I&Dleft thumb with partial amputation   I & D EXTREMITY  12/26/2011   Procedure: IRRIGATION AND DEBRIDEMENT EXTREMITY;  Surgeon: Tami Ribas, MD;  Location: [facility];  Service: Orthopedics;  Laterality: Left;   I & D EXTREMITY  12/23/2011   Procedure: IRRIGATION AND DEBRIDEMENT EXTREMITY;  Surgeon: Tami Ribas, MD;  Location: [facility];  Service: Orthopedics;  Laterality: Left;  I&D Left thumb, hand and arm   I&D left thumb  12/23/2011   IR RADIOLOGIST EVAL & MGMT  01/04/2019   IR RADIOLOGIST EVAL & MGMT  01/25/2019   LAPAROSCOPIC APPENDECTOMY N/A 12/06/2018   Procedure: APPENDECTOMY LAPAROSCOPIC;  Surgeon: Rodman Pickle, MD;  Location: [facility];  Service: General;  Laterality: N/A;   TUBAL LIGATION     Family History  Problem Relation Age of Onset   Diabetes Mother    Alcohol abuse Mother    Diabetes Father    Alzheimer's disease Father    Hypertension Father    Kidney failure Sister    Diabetes Sister    High blood pressure Sister    Social History   Tobacco Use   Smoking status: Never   Smokeless tobacco: Never  Vaping Use   Vaping status: Never Used  Substance Use Topics   Alcohol use: Yes    Comment: h/o alcohol abuse 4 years ago (07/21/15)   Drug use: Yes    Types: Cocaine    Comment:  last used cocaine 4 years ago (07/21/15)   Current Outpatient Medications  Medication Sig Dispense Refill   amlodipine-olmesartan (AZOR) 10-20 MG tablet Take 1 tablet by mouth daily. 90 tablet 0   atorvastatin (LIPITOR) 40 MG tablet TAKE 1  TABLET (40 MG TOTAL) BY MOUTH DAILY. 90 tablet 0   B Complex-C-Folic Acid (DIALYVITE 800) 0.8 MG TABS Take 1 tablet by mouth daily.   11   blood glucose meter kit and supplies KIT Dispense based on patient and insurance preference. Use up to four times daily as directed. 1 each 0   blood glucose meter kit and supplies Dispense based on patient and insurance preference. Use up to four times daily as directed. (FOR ICD-10 E10.9, E11.9). 1 each 0   Blood Pressure Monitoring (ADULT BLOOD PRESSURE CUFF LG) KIT 1 each by Does not apply route daily. 1 kit 0   carvedilol (COREG) 25  MG tablet Take 25 mg by mouth 2 (two) times daily with a meal.     ferric citrate (AURYXIA) 1 GM 210 MG(Fe) tablet      gabapentin (NEURONTIN) 100 MG capsule TAKE 1 CAPSULE (100 MG TOTAL) BY MOUTH 3 (THREE) TIMES DAILY. 270 capsule 0   glucose blood (ACCU-CHEK GUIDE) test strip User to check blood sugar up to 4x per day e11.9 100 each 12   hydrALAZINE (APRESOLINE) 10 MG tablet TAKE 1 TABLET (10 MG TOTAL) BY MOUTH 3 (THREE) TIMES DAILY. 270 tablet 0   Insulin Pen Needle (EASY TOUCH PEN NEEDLES) 32G X 4 MM MISC 1 each by Does not apply route 3 (three) times daily. 100 each 11   Lancet Devices (ONETOUCH DELICA PLUS LANCING) MISC CHECK BLOOD SUGARS FOURX DAILY 1 each 0   Lancets (ONETOUCH DELICA PLUS LANCET33G) MISC USE TO TEST BLOOD SUGAR AS DIRECTED 100 each 12   LANTUS SOLOSTAR 100 UNIT/ML Solostar Pen Inject 10 Units into the skin daily. 15 mL 0   omeprazole (PRILOSEC) 40 MG capsule Take 1 capsule (40 mg total) by mouth daily. 30 capsule 3   No current facility-administered medications for this visit.   No Known Allergies   Review of Systems: All systems reviewed and negative except where noted in HPI.   Wt Readings from Last 3 Encounters:  04/05/23 195 lb 6.4 oz (88.6 kg)  01/20/23 202 lb (91.6 kg)  01/06/23 199 lb 6.4 oz (90.4 kg)    Physical Exam:  LMP 03/07/2019  Constitutional:  Pleasant, female in no acute  distress. On wheelchair Psychiatric:  Normal mood and affect. Behavior is normal. EENT: Pupils normal.  Conjunctivae are normal. No scleral icterus. Neck supple.  Cardiovascular: Normal rate, regular rhythm.  Pulmonary/chest: Effort normal and breath sounds normal. No wheezing, rales or rhonchi. Abdominal: Limited exam in wheelchair. Soft, nondistended, nontender. Bowel sounds active throughout.  Neurological: Alert and oriented to person place and time. Extremities: right BKA  Willette Cluster, NP  07/05/2023, 10:39 AM  Cc:  Referring Provider Vonna Drafts, MD

## 2023-07-05 NOTE — Patient Instructions (Addendum)
_______________________________________________________  If your blood pressure at your visit was 140/90 or greater, please contact your primary care physician to follow up on this.  If you are age 56 or younger, your body mass index should be between 19-25. Your Body mass index is 30.6 kg/m. If this is out of the aformentioned range listed, please consider follow up with your Primary Care Provider.  ________________________________________________________  The [facility] providers would like to encourage you to use [facility] to communicate with providers for non-urgent requests or questions.  Due to long hold times on the telephone, sending your provider a message by [facility] may be a faster and more efficient way to get a response.  Please allow 48 business hours for a response.  Please remember that this is for non-urgent requests.  _______________________________________________________  CONTINUE: Prilosec 40mg  one tablet daily  You have been scheduled for a colonoscopy. Please follow written instructions given to you at your visit today.   Please pick up your prep supplies at the pharmacy within the next 1-3 days.  If you use inhalers (even only as needed), please bring them with you on the day of your procedure.  DO NOT TAKE 7 DAYS PRIOR TO TEST- Trulicity (dulaglutide) Ozempic, Wegovy (semaglutide) Mounjaro (tirzepatide) Bydureon Bcise (exanatide extended release)  DO NOT TAKE 1 DAY PRIOR TO YOUR TEST Rybelsus (semaglutide) Adlyxin (lixisenatide) Victoza (liraglutide) Byetta (exanatide) ___________________________________________________________________________  Due to recent changes in healthcare laws, you may see the results of your imaging and laboratory studies on MyChart before your provider has had a chance to review them.  We understand that in some cases there may be results that are confusing or concerning to you. Not all laboratory results come back in the same time  frame and the provider may be waiting for multiple results in order to interpret others.  Please give [place] 48 hours in order for your provider to thoroughly review all the results before contacting the office for clarification of your results.   Thank you for entrusting me with your care and choosing [facility].  Gunnar Fusi, NP

## 2023-07-11 ENCOUNTER — Other Ambulatory Visit: Payer: Self-pay | Admitting: Family Medicine

## 2023-07-11 DIAGNOSIS — E1142 Type 2 diabetes mellitus with diabetic polyneuropathy: Secondary | ICD-10-CM

## 2023-07-11 DIAGNOSIS — K219 Gastro-esophageal reflux disease without esophagitis: Secondary | ICD-10-CM

## 2023-07-14 ENCOUNTER — Other Ambulatory Visit: Payer: Self-pay | Admitting: Family Medicine

## 2023-07-14 DIAGNOSIS — Z1231 Encounter for screening mammogram for malignant neoplasm of breast: Secondary | ICD-10-CM

## 2023-08-10 ENCOUNTER — Other Ambulatory Visit: Payer: Self-pay | Admitting: Family Medicine

## 2023-08-16 ENCOUNTER — Encounter (HOSPITAL_COMMUNITY): Payer: Self-pay | Admitting: Gastroenterology

## 2023-08-25 ENCOUNTER — Other Ambulatory Visit: Payer: Self-pay

## 2023-08-25 ENCOUNTER — Ambulatory Visit (HOSPITAL_COMMUNITY)
Admission: RE | Admit: 2023-08-25 | Discharge: 2023-08-25 | Disposition: A | Discharge status: Home / Self Care | Payer: 59 | Attending: Gastroenterology | Admitting: Gastroenterology

## 2023-08-25 ENCOUNTER — Ambulatory Visit (HOSPITAL_COMMUNITY): Payer: 59 | Admitting: Registered Nurse

## 2023-08-25 ENCOUNTER — Ambulatory Visit (HOSPITAL_BASED_OUTPATIENT_CLINIC_OR_DEPARTMENT_OTHER): Payer: 59 | Admitting: Registered Nurse

## 2023-08-25 ENCOUNTER — Encounter (HOSPITAL_COMMUNITY): Payer: Self-pay | Admitting: Gastroenterology

## 2023-08-25 ENCOUNTER — Encounter (HOSPITAL_COMMUNITY)
Admission: RE | Disposition: A | Discharge status: Home / Self Care | Payer: Self-pay | Source: Home / Self Care | Attending: Gastroenterology

## 2023-08-25 DIAGNOSIS — D128 Benign neoplasm of rectum: Secondary | ICD-10-CM | POA: Insufficient documentation

## 2023-08-25 DIAGNOSIS — K6389 Other specified diseases of intestine: Secondary | ICD-10-CM | POA: Diagnosis not present

## 2023-08-25 DIAGNOSIS — N186 End stage renal disease: Secondary | ICD-10-CM | POA: Insufficient documentation

## 2023-08-25 DIAGNOSIS — H548 Legal blindness, as defined in USA: Secondary | ICD-10-CM | POA: Insufficient documentation

## 2023-08-25 DIAGNOSIS — K219 Gastro-esophageal reflux disease without esophagitis: Secondary | ICD-10-CM | POA: Insufficient documentation

## 2023-08-25 DIAGNOSIS — E1122 Type 2 diabetes mellitus with diabetic chronic kidney disease: Secondary | ICD-10-CM | POA: Insufficient documentation

## 2023-08-25 DIAGNOSIS — Z1211 Encounter for screening for malignant neoplasm of colon: Secondary | ICD-10-CM

## 2023-08-25 DIAGNOSIS — D123 Benign neoplasm of transverse colon: Secondary | ICD-10-CM | POA: Insufficient documentation

## 2023-08-25 DIAGNOSIS — K635 Polyp of colon: Secondary | ICD-10-CM

## 2023-08-25 DIAGNOSIS — D124 Benign neoplasm of descending colon: Secondary | ICD-10-CM | POA: Insufficient documentation

## 2023-08-25 DIAGNOSIS — E119 Type 2 diabetes mellitus without complications: Secondary | ICD-10-CM | POA: Diagnosis not present

## 2023-08-25 DIAGNOSIS — R1310 Dysphagia, unspecified: Secondary | ICD-10-CM

## 2023-08-25 DIAGNOSIS — Z794 Long term (current) use of insulin: Secondary | ICD-10-CM | POA: Diagnosis not present

## 2023-08-25 DIAGNOSIS — I10 Essential (primary) hypertension: Secondary | ICD-10-CM

## 2023-08-25 DIAGNOSIS — I120 Hypertensive chronic kidney disease with stage 5 chronic kidney disease or end stage renal disease: Secondary | ICD-10-CM | POA: Diagnosis not present

## 2023-08-25 DIAGNOSIS — Z992 Dependence on renal dialysis: Secondary | ICD-10-CM

## 2023-08-25 HISTORY — PX: POLYPECTOMY: SHX5525

## 2023-08-25 HISTORY — PX: COLONOSCOPY WITH PROPOFOL: SHX5780

## 2023-08-25 LAB — POCT I-STAT, CHEM 8
BUN: 32 mg/dL — ABNORMAL HIGH (ref 6–20)
BUN: 35 mg/dL — ABNORMAL HIGH (ref 6–20)
Calcium, Ion: 0.85 mmol/L — CL (ref 1.15–1.40)
Calcium, Ion: 1.08 mmol/L — ABNORMAL LOW (ref 1.15–1.40)
Chloride: 99 mmol/L (ref 98–111)
Chloride: 94 mmol/L — ABNORMAL LOW (ref 98–111)
Creatinine, Ser: 7.1 mg/dL — ABNORMAL HIGH (ref 0.44–1.00)
Creatinine, Ser: 7 mg/dL — ABNORMAL HIGH (ref 0.44–1.00)
Glucose, Bld: 98 mg/dL (ref 70–99)
Glucose, Bld: 96 mg/dL (ref 70–99)
HCT: 34 % — ABNORMAL LOW (ref 36.0–46.0)
HCT: 34 % — ABNORMAL LOW (ref 36.0–46.0)
Hemoglobin: 11.6 g/dL — ABNORMAL LOW (ref 12.0–15.0)
Hemoglobin: 11.6 g/dL — ABNORMAL LOW (ref 12.0–15.0)
Potassium: 5.1 mmol/L (ref 3.5–5.1)
Potassium: 5.5 mmol/L — ABNORMAL HIGH (ref 3.5–5.1)
Sodium: 135 mmol/L (ref 135–145)
Sodium: 138 mmol/L (ref 135–145)
TCO2: 31 mmol/L (ref 22–32)
TCO2: 34 mmol/L — ABNORMAL HIGH (ref 22–32)

## 2023-08-25 SURGERY — COLONOSCOPY WITH PROPOFOL
Anesthesia: Monitor Anesthesia Care

## 2023-08-25 MED ORDER — PROPOFOL 500 MG/50ML IV EMUL
INTRAVENOUS | Status: DC | PRN
  Administered 2023-08-25: 110 ug/kg/min via INTRAVENOUS

## 2023-08-25 MED ORDER — LIDOCAINE 2% (20 MG/ML) 5 ML SYRINGE
INTRAMUSCULAR | Status: DC | PRN
  Administered 2023-08-25: 20 mg via INTRAVENOUS

## 2023-08-25 MED ORDER — PHENYLEPHRINE 80 MCG/ML (10ML) SYRINGE FOR IV PUSH (FOR BLOOD PRESSURE SUPPORT)
PREFILLED_SYRINGE | INTRAVENOUS | Status: DC | PRN
  Administered 2023-08-25: 80 ug via INTRAVENOUS

## 2023-08-25 MED ORDER — SODIUM CHLORIDE 0.9 % IV SOLN: INTRAVENOUS | Status: DC

## 2023-08-25 MED ORDER — LACTATED RINGERS IV SOLN: INTRAVENOUS | Status: DC

## 2023-08-25 MED ORDER — PROPOFOL 1000 MG/100ML IV EMUL
INTRAVENOUS | Status: AC
  Filled 2023-08-25: qty 100

## 2023-08-25 MED ORDER — PROPOFOL 10 MG/ML IV BOLUS
INTRAVENOUS | Status: DC | PRN
Start: 2023-08-25 — End: 2023-08-25
  Administered 2023-08-25: 10 mg via INTRAVENOUS

## 2023-08-25 SURGICAL SUPPLY — 22 items
ELECT REM PT RETURN 9FT ADLT (ELECTROSURGICAL)
ELECTRODE REM PT RTRN 9FT ADLT (ELECTROSURGICAL) IMPLANT
FCP BXJMBJMB 240X2.8X (CUTTING FORCEPS)
FLOOR PAD 36X40 (MISCELLANEOUS) ×2
FORCEPS BIOP RAD 4 LRG CAP 4 (CUTTING FORCEPS) IMPLANT
FORCEPS BIOP RJ4 240 W/NDL (CUTTING FORCEPS)
FORCEPS BXJMBJMB 240X2.8X (CUTTING FORCEPS) IMPLANT
INJECTOR/SNARE I SNARE (MISCELLANEOUS) IMPLANT
LUBRICANT JELLY 4.5OZ STERILE (MISCELLANEOUS) IMPLANT
MANIFOLD NEPTUNE II (INSTRUMENTS) IMPLANT
NDL SCLEROTHERAPY 25GX240 (NEEDLE) IMPLANT
NEEDLE SCLEROTHERAPY 25GX240 (NEEDLE)
PAD FLOOR 36X40 (MISCELLANEOUS) ×3 IMPLANT
PROBE APC STR FIRE (PROBE) IMPLANT
PROBE INJECTION GOLD (MISCELLANEOUS)
PROBE INJECTION GOLD 7FR (MISCELLANEOUS) IMPLANT
SNARE ROTATE MED OVAL 20MM (MISCELLANEOUS) IMPLANT
SYR 50ML LL SCALE MARK (SYRINGE) IMPLANT
TRAP SPECIMEN MUCOUS 40CC (MISCELLANEOUS) IMPLANT
TUBING ENDO SMARTCAP PENTAX (MISCELLANEOUS) IMPLANT
TUBING IRRIGATION ENDOGATOR (MISCELLANEOUS) ×3 IMPLANT
WATER STERILE IRR 1000ML POUR (IV SOLUTION) IMPLANT

## 2023-08-25 NOTE — H&P (Signed)
History & Physical  Primary Care Physician:  Vonna Drafts, MD Primary Gastroenterologist: Yancey Flemings, MD  Impression / Plan:  Average risk CRC screening for colonoscopy ERSD on HD   CHIEF COMPLAINT:  CRC screening   HPI: [name] is a 57 y.o. female average risk CRC screening, ERSD on HD, for colonoscopy.    Past Medical History:  Diagnosis Date   Alcohol dependency (HCC)    Anemia    Appendicitis 12/06/2018   Chronic kidney disease    Diabetes mellitus    type 2   Diabetes mellitus type 2, uncontrolled    End stage renal disease (HCC) 05/05/2016   Starting 04/2016, LUE fistula   Enterococcal bacteremia    Hypertension    Legally blind in left eye, as defined in [place]    Liver abscess 12/20/2018   Necrotizing fasciitis (HCC)    PONV (postoperative nausea and vomiting)    Sepsis without acute organ dysfunction (HCC)    Type 2 diabetes mellitus with diabetic chronic kidney disease (HCC) 05/05/2016    Past Surgical History:  Procedure Laterality Date   AMPUTATION     right first and left middle finger amputation 2/2 OM 08/2011 by Dr. Amanda Pea   AMPUTATION  01/18/2012   Procedure: AMPUTATION DIGIT;  Surgeon: Tami Ribas, MD;  Location: [facility];  Service: Orthopedics;  Laterality: Left;  revision amputation left thumb   AMPUTATION  08/25/2012   Procedure: AMPUTATION BELOW KNEE;  Surgeon: Nadara Mustard, MD;  Location: [facility] OR;  Service: Orthopedics;  Laterality: Right;  Right Below Knee Amputation   APPENDECTOMY     BASCILIC VEIN TRANSPOSITION Left 07/22/2015   Procedure: BASILIC VEIN TRANSPOSITION ;  Surgeon: Sherren Kerns, MD;  Location: [facility] OR;  Service: Vascular;  Laterality: Left;   CATARACT EXTRACTION     right    CESAREAN SECTION  1986   CESAREAN SECTION  1985   CESAREAN SECTION  1991   I & D EXTREMITY  12/24/2011   Procedure: IRRIGATION AND DEBRIDEMENT EXTREMITY;  Surgeon: Tami Ribas, MD;  Location: [facility] OR;  Service: Orthopedics;  Laterality:  Left;  I&Dleft thumb with partial amputation   I & D EXTREMITY  12/26/2011   Procedure: IRRIGATION AND DEBRIDEMENT EXTREMITY;  Surgeon: Tami Ribas, MD;  Location: [facility] OR;  Service: Orthopedics;  Laterality: Left;   I & D EXTREMITY  12/23/2011   Procedure: IRRIGATION AND DEBRIDEMENT EXTREMITY;  Surgeon: Tami Ribas, MD;  Location: [facility] OR;  Service: Orthopedics;  Laterality: Left;  I&D Left thumb, hand and arm   I&D left thumb  12/23/2011   IR RADIOLOGIST EVAL & MGMT  01/04/2019   IR RADIOLOGIST EVAL & MGMT  01/25/2019   LAPAROSCOPIC APPENDECTOMY N/A 12/06/2018   Procedure: APPENDECTOMY LAPAROSCOPIC;  Surgeon: Kinsinger, De Blanch, MD;  Location: [facility] OR;  Service: General;  Laterality: N/A;   TUBAL LIGATION      Prior to Admission medications   Medication Sig Start Date End Date Taking? Authorizing Provider  amlodipine-olmesartan (AZOR) 10-20 MG tablet Take 1 tablet by mouth daily. 06/01/23  Yes Vonna Drafts, MD  atorvastatin (LIPITOR) 40 MG tablet TAKE 1 TABLET (40 MG TOTAL) BY MOUTH DAILY. 03/15/23  Yes Vonna Drafts, MD  B Complex-C-Folic Acid (DIALYVITE 800) 0.8 MG TABS Take 1 tablet by mouth daily.  10/09/18  Yes [provider]  carvedilol (COREG) 25 MG tablet Take 25 mg by mouth 2 (two) times daily with a meal.  Yes [provider]  gabapentin (NEURONTIN) 100 MG capsule TAKE 1 CAPSULE (100 MG TOTAL) BY MOUTH 3 (THREE) TIMES DAILY. 07/12/23  Yes Vonna Drafts, MD  hydrALAZINE (APRESOLINE) 10 MG tablet TAKE 1 TABLET (10 MG TOTAL) BY MOUTH 3 (THREE) TIMES DAILY. 08/10/23  Yes Vonna Drafts, MD  LANTUS SOLOSTAR 100 UNIT/ML Solostar Pen Inject 10 Units into the skin daily. 01/20/23  Yes Vonna Drafts, MD  blood glucose meter kit and supplies KIT Dispense based on patient and insurance preference. Use up to four times daily as directed. 08/12/22   Vonna Drafts, MD  blood glucose meter kit and supplies Dispense based on patient and insurance preference. Use up to four times daily as  directed. (FOR ICD-10 E10.9, E11.9). 11/21/20   Shirlean Mylar, MD  Blood Pressure Monitoring (ADULT BLOOD PRESSURE CUFF LG) KIT 1 each by Does not apply route daily. 04/27/22   Shirlean Mylar, MD  ferric citrate Gean Quint) 1 GM 210 MG(Fe) tablet  05/22/19   [provider]  glucose blood (ACCU-CHEK GUIDE) test strip User to check blood sugar up to 4x per day e11.9 10/07/22   Vonna Drafts, MD  Insulin Pen Needle (EASY TOUCH PEN NEEDLES) 32G X 4 MM MISC 1 each by Does not apply route 3 (three) times daily. 08/10/18   Caro Laroche, DO  Lancet Devices [facility] DELICA PLUS LANCING) MISC CHECK BLOOD SUGARS FOURX DAILY 12/03/20   Shirlean Mylar, MD  Lancets [facility] DELICA PLUS [place]) MISC USE TO TEST BLOOD SUGAR AS DIRECTED 12/11/21   Shirlean Mylar, MD  Na Sulfate-K Sulfate-Mg Sulf (SUPREP BOWEL PREP KIT) 17.5-3.13-1.6 GM/177ML SOLN Take 1 kit by mouth as directed. 07/05/23   Meredith Pel, NP  omeprazole (PRILOSEC) 40 MG capsule TAKE 1 CAPSULE (40 MG TOTAL) BY MOUTH DAILY. 07/12/23   Vonna Drafts, MD    Current Facility-Administered Medications  Medication Dose Route Frequency Provider Last Rate Last Admin   0.9 %  sodium chloride infusion   Intravenous Continuous Meredith Pel, NP       lactated ringers infusion   Intravenous Continuous Meryl Dare, MD        Allergies as of 07/05/2023   (No Known Allergies)    Family History  Problem Relation Age of Onset   Diabetes Mother    Alcohol abuse Mother    Diabetes Father    Alzheimer's disease Father    Hypertension Father    Kidney failure Sister    Diabetes Sister    High blood pressure Sister     Social History   Socioeconomic History   Marital status: Single    Spouse name: Not on file   Number of children: 3   Years of education: 10   Highest education level: Not on file  Occupational History    Employer: [employer]  Tobacco Use   Smoking status: Never   Smokeless tobacco: Never  Vaping Use    Vaping status: Never Used  Substance and Sexual Activity   Alcohol use: Yes    Comment: h/o alcohol abuse 4 years ago (07/21/15)   Drug use: Yes    Types: Cocaine    Comment:  last used cocaine 4 years ago (07/21/15)   Sexual activity: Yes    Birth control/protection: Surgical  Other Topics Concern   Not on file  Social History Narrative   Patient lives with her boyfriend in [place].    Patient enjoys watching TV, and spending time with her children and grandchildren.  Patient is legally blind and is in a wheel chair. Patient relies on [employer] transportation.    Social Determinants of Health   Financial Resource Strain: Low Risk  (12/12/2019)   Overall Financial Resource Strain (CARDIA)    Difficulty of Paying Living Expenses: Not very hard  Food Insecurity: No Food Insecurity (05/19/2022)   Hunger Vital Sign    Worried About Running Out of Food in the Last Year: Never true    Ran Out of Food in the Last Year: Never true  Transportation Needs: No Transportation Needs (05/19/2022)   PRAPARE - Administrator, Civil Service (Medical): No    Lack of Transportation (Non-Medical): No  Physical Activity: Inactive (12/12/2019)   Exercise Vital Sign    Days of Exercise per Week: 0 days    Minutes of Exercise per Session: 0 min  Stress: No Stress Concern Present (12/12/2019)   [employer] of Occupational Health - Occupational Stress Questionnaire    Feeling of Stress : Not at all  Social Connections: Moderately Isolated (12/12/2019)   Social Connection and Isolation Panel [NHANES]    Frequency of Communication with Friends and Family: More than three times a week    Frequency of Social Gatherings with Friends and Family: More than three times a week    Attends Religious Services: Never    Database administrator or Organizations: No    Attends Banker Meetings: Never    Marital Status: Living with partner  Intimate Partner Violence: Not At Risk (12/12/2019)    Humiliation, Afraid, Rape, and Kick questionnaire    Fear of Current or Ex-Partner: No    Emotionally Abused: No    Physically Abused: No    Sexually Abused: No    Review of Systems:  All systems reviewed were negative except where noted in HPI.   Physical Exam: Vital signs in last 24 hours: Temp:  [98.8 F (37.1 C)] 98.8 F (37.1 C) (09/19 0840) Pulse Rate:  [86] 86 (09/19 0840) Resp:  [16] 16 (09/19 0840) BP: (176)/(71) 176/71 (09/19 0840) SpO2:  [96 %] 96 % (09/19 0840) Weight:  [88.5 kg] 88.5 kg (09/19 0840)   General:  Alert, well-developed, in NAD Head:  Normocephalic and atraumatic. Eyes:  Sclera clear, no icterus.   Conjunctiva pink. Ears:  Normal auditory acuity. Mouth:  No deformity or lesions.  Neck:  Supple; no masses. Lungs:  Clear throughout to auscultation.   No wheezes, crackles, or rhonchi.  Heart:  Regular rate and rhythm; no murmurs. Abdomen:  Soft, nondistended, nontender. No masses, hepatomegaly. No palpable masses.  Normal bowel sounds.    Rectal:  Deferred   Msk:  Symmetrical without gross deformities. Extremities:  Without edema. Neurologic:  Alert and  oriented x 4; grossly normal neurologically. Skin:  Intact without significant lesions or rashes. Psych:  Alert and cooperative. Normal mood and affect.   Venita Lick. Russella Dar  08/25/2023, 9:27 AM See Loretha Stapler, [facility], to contact our on call provider

## 2023-08-25 NOTE — Op Note (Signed)
[facility] Patient Name: [name] Procedure Date: 08/25/2023 MRN: [id number] Attending MD: Meryl Dare , MD, [phone] Date of Birth: 1966-05-02 CSN: [id number] Age: 57 Admit Type: Ambulatory Procedure:                Colonoscopy Indications:              Screening for colorectal malignant neoplasm Providers:                Venita Lick. Russella Dar, MD, Lorenza Evangelist, RN, Marja Kays, Technician Referring MD:             Wilhemina Bonito. Marina Goodell, MD Medicines:                Monitored Anesthesia Care Complications:            No immediate complications. Estimated blood loss:                            None. Estimated Blood Loss:     Estimated blood loss: none. Procedure:                Pre-Anesthesia Assessment:                           - Prior to the procedure, a History and Physical                            was performed, and patient medications and                            allergies were reviewed. The patient's tolerance of                            previous anesthesia was also reviewed. The risks                            and benefits of the procedure and the sedation                            options and risks were discussed with the patient.                            All questions were answered, and informed consent                            was obtained. Prior Anticoagulants: The patient has                            taken no anticoagulant or antiplatelet agents. ASA                            Grade Assessment: III - A patient with severe  systemic disease. After reviewing the risks and                            benefits, the patient was deemed in satisfactory                            condition to undergo the procedure.                           After obtaining informed consent, the colonoscope                            was passed under direct vision. Throughout the                             procedure, the patient's blood pressure, pulse, and                            oxygen saturations were monitored continuously. The                            [id number]-HQ190L ([id number]) Olympus colonoscope was                            introduced through the anus and advanced to the the                            cecum, identified by appendiceal orifice and                            ileocecal valve. The ileocecal valve, appendiceal                            orifice, and rectum were photographed. The quality                            of the bowel preparation was good. The colonoscopy                            was performed without difficulty. The patient                            tolerated the procedure well. Scope In: 9:57:25 AM Scope Out: 10:18:56 AM Scope Withdrawal Time: 0 hours 16 minutes 28 seconds  Total Procedure Duration: 0 hours 21 minutes 31 seconds  Findings:      The perianal and digital rectal examinations were normal.      Five sessile polyps were found in the rectum (2), descending colon (2)       and transverse colon (1). The polyps were 5 to 7 mm in size. These       polyps were removed with a cold snare. Resection and retrieval were       complete.      A diffuse area of severe melanosis was found in the entire colon.      The  exam was otherwise without abnormality on direct and retroflexion       views. Impression:               - Five 5 to 7 mm polyps in the rectum, in the                            descending colon and in the transverse colon,                            removed with a cold snare. Resected and retrieved.                           - Melanosis in the colon.                           - The examination was otherwise normal on direct                            and retroflexion views. Moderate Sedation:      Not Applicable - Patient had care per Anesthesia. Recommendation:           - Repeat colonoscopy after studies are complete for                             surveillance based on pathology results with Dr.                            Marina Goodell.                           - Patient has a contact number available for                            emergencies. The signs and symptoms of potential                            delayed complications were discussed with the                            patient. Return to normal activities tomorrow.                            Written discharge instructions were provided to the                            patient.                           - Resume previous diet.                           - Continue present medications.                           - Await pathology results. Procedure Code(s):        ---  Professional ---                           [phone], Colonoscopy, flexible; with removal of                            tumor(s), polyp(s), or other lesion(s) by snare                            technique Diagnosis Code(s):        --- Professional ---                           Z12.11, Encounter for screening for malignant                            neoplasm of colon                           D12.8, Benign neoplasm of rectum                           D12.4, Benign neoplasm of descending colon                           D12.3, Benign neoplasm of transverse colon (hepatic                            flexure or splenic flexure)                           K63.89, Other specified diseases of intestine CPT copyright 2022 American Medical Association. All rights reserved. The codes documented in this report are preliminary and upon coder review may  be revised to meet current compliance requirements. Meryl Dare, MD 08/25/2023 10:23:43 AM This report has been signed electronically. Number of Addenda: 0

## 2023-08-25 NOTE — Transfer of Care (Signed)
Immediate Anesthesia Transfer of Care Note  Patient: [name]  Procedure(s) Performed: COLONOSCOPY WITH PROPOFOL POLYPECTOMY  Patient Location: PACU and Endoscopy Unit  Anesthesia Type:MAC  Level of Consciousness: awake, alert , oriented, and patient cooperative  Airway & Oxygen Therapy: Patient Spontanous Breathing and Patient connected to face mask oxygen  Post-op Assessment: Report given to RN, Post -op Vital signs reviewed and stable, and Patient moving all extremities  Post vital signs: Reviewed and stable  Last Vitals:  Vitals Value Taken Time  BP    Temp    Pulse 84 08/25/23 1026  Resp 18 08/25/23 1026  SpO2 100 % 08/25/23 1026  Vitals shown include unfiled device data.  Last Pain:  Vitals:   08/25/23 0840  TempSrc: Temporal  PainSc: 0-No pain         Complications: No notable events documented.

## 2023-08-25 NOTE — Discharge Instructions (Signed)
YOU HAD AN ENDOSCOPIC PROCEDURE TODAY: Refer to the procedure report and other information in the discharge instructions given to you for any specific questions about what was found during the examination. If this information does not answer your questions, please call [place] office at [phone] to clarify.   YOU SHOULD EXPECT: Some feelings of bloating in the abdomen. Passage of more gas than usual. Walking can help get rid of the air that was put into your GI tract during the procedure and reduce the bloating. If you had a lower endoscopy (such as a colonoscopy or flexible sigmoidoscopy) you may notice spotting of blood in your stool or on the toilet paper. Some abdominal soreness may be present for a day or two, also.  DIET: Your first meal following the procedure should be a light meal and then it is ok to progress to your normal diet. A half-sandwich or bowl of soup is an example of a good first meal. Heavy or fried foods are harder to digest and may make you feel nauseous or bloated. Drink plenty of fluids but you should avoid alcoholic beverages for 24 hours. If you had a esophageal dilation, please see attached instructions for diet.    ACTIVITY: Your care partner should take you home directly after the procedure. You should plan to take it easy, moving slowly for the rest of the day. You can resume normal activity the day after the procedure however YOU SHOULD NOT DRIVE, use power tools, machinery or perform tasks that involve climbing or major physical exertion for 24 hours (because of the sedation medicines used during the test).   SYMPTOMS TO REPORT IMMEDIATELY: A gastroenterologist can be reached at any hour. Please call [phone]  for any of the following symptoms:  Following lower endoscopy (colonoscopy, flexible sigmoidoscopy) Excessive amounts of blood in the stool  Significant tenderness, worsening of abdominal pains  Swelling of the abdomen that is new, acute  Fever of 100 or  higher   FOLLOW UP:  If any biopsies were taken you will be contacted by phone or by letter within the next 1-3 weeks. Call [phone]  if you have not heard about the biopsies in 3 weeks.  Please also call with any specific questions about appointments or follow up tests.

## 2023-08-25 NOTE — Anesthesia Procedure Notes (Signed)
Procedure Name: MAC Date/Time: 08/25/2023 8:49 AM  Performed by: Elisabeth Cara, CRNAPre-anesthesia Checklist: Patient identified, Emergency Drugs available, Suction available, Patient being monitored and Timeout performed Patient Re-evaluated:Patient Re-evaluated prior to induction Oxygen Delivery Method: Simple face mask Preoxygenation: Pre-oxygenation with 100% oxygen Placement Confirmation: positive ETCO2 Dental Injury: Teeth and Oropharynx as per pre-operative assessment

## 2023-08-25 NOTE — Anesthesia Postprocedure Evaluation (Signed)
Anesthesia Post Note  Patient: [name]  Procedure(s) Performed: COLONOSCOPY WITH PROPOFOL POLYPECTOMY     Patient location during evaluation: PACU Anesthesia Type: MAC Level of consciousness: awake and alert Pain management: pain level controlled Vital Signs Assessment: post-procedure vital signs reviewed and stable Respiratory status: spontaneous breathing, nonlabored ventilation and respiratory function stable Cardiovascular status: stable and blood pressure returned to baseline Anesthetic complications: no   No notable events documented.  Last Vitals:  Vitals:   08/25/23 1040 08/25/23 1050  BP: 134/88 (!) 172/67  Pulse: 84 86  Resp: 18 14  Temp:    SpO2: 97% 97%    Last Pain:  Vitals:   08/25/23 1050  TempSrc:   PainSc: 0-No pain                 Beryle Lathe

## 2023-08-25 NOTE — Anesthesia Preprocedure Evaluation (Addendum)
Anesthesia Evaluation  Patient identified by MRN, date of birth, ID band Patient awake    Reviewed: Allergy & Precautions, NPO status , Patient's Chart, lab work & pertinent test results, reviewed documented beta blocker date and time   History of Anesthesia Complications (+) PONV and history of anesthetic complications  Airway Mallampati: II  TM Distance: >3 FB Neck ROM: Full    Dental  (+) Edentulous Upper, Edentulous Lower   Pulmonary neg pulmonary ROS   Pulmonary exam normal        Cardiovascular hypertension, Pt. on medications and Pt. on home beta blockers Normal cardiovascular exam     Neuro/Psych negative neurological ROS  negative psych ROS   GI/Hepatic ,GERD  Medicated and Controlled,,(+)     substance abuse  alcohol use  Endo/Other  diabetes, Type 2, Insulin Dependent    Renal/GU ESRF and DialysisRenal disease     Musculoskeletal negative musculoskeletal ROS (+)    Abdominal   Peds  Hematology negative hematology ROS (+)   Anesthesia Other Findings Legally blind   Reproductive/Obstetrics                             Anesthesia Physical Anesthesia Plan  ASA: 3  Anesthesia Plan: MAC   Post-op Pain Management: Minimal or no pain anticipated   Induction:   PONV Risk Score and Plan: 3 and Propofol infusion and Treatment may vary due to age or medical condition  Airway Management Planned: Natural Airway and Simple Face Mask  Additional Equipment: None  Intra-op Plan:   Post-operative Plan:   Informed Consent: I have reviewed the patients History and Physical, chart, labs and discussed the procedure including the risks, benefits and alternatives for the proposed anesthesia with the patient or authorized representative who has indicated his/her understanding and acceptance.       Plan Discussed with: CRNA and Anesthesiologist  Anesthesia Plan Comments:         Anesthesia Quick Evaluation

## 2023-08-25 NOTE — Progress Notes (Signed)
Pt resting comfortably in recovery bay 2.  Transportation service contacted.

## 2023-08-26 LAB — SURGICAL PATHOLOGY

## 2023-08-28 ENCOUNTER — Encounter: Payer: Self-pay | Admitting: Gastroenterology

## 2023-08-28 ENCOUNTER — Encounter (HOSPITAL_COMMUNITY): Payer: Self-pay | Admitting: Gastroenterology

## 2023-09-14 ENCOUNTER — Other Ambulatory Visit: Payer: Self-pay | Admitting: Family Medicine

## 2023-09-14 DIAGNOSIS — I159 Secondary hypertension, unspecified: Secondary | ICD-10-CM

## 2023-09-27 ENCOUNTER — Ambulatory Visit: Payer: 59

## 2023-09-27 DIAGNOSIS — Z0000 Encounter for general adult medical examination without abnormal findings: Secondary | ICD-10-CM

## 2023-09-27 NOTE — Patient Instructions (Signed)
[name] , Thank you for taking time to come for your Medicare Wellness Visit. I appreciate your ongoing commitment to your health goals. Please review the following plan we discussed and let me know if I can assist you in the future.   Screening recommendations/referrals: Colonoscopy: up to date Mammogram: scheduled  11-14-202 Recommended yearly ophthalmology/optometry visit for glaucoma screening and checkup Recommended yearly dental visit for hygiene and checkup  Vaccinations: Influenza vaccine: up to date Tdap vaccine: up to date Shingles vaccine: Education provided    Advanced directives: Education provided    Preventive Care 40-64Years and Older, Female Preventive care refers to lifestyle choices and visits with your health care provider that can promote health and wellness. What does preventive care include? A yearly physical exam. This is also called an annual well check. Dental exams once or twice a year. Routine eye exams. Ask your health care provider how often you should have your eyes checked. Personal lifestyle choices, including: Daily care of your teeth and gums. Regular physical activity. Eating a healthy diet. Avoiding tobacco and drug use. Limiting alcohol use. Practicing safe sex. Taking low-dose aspirin every day. Taking vitamin and mineral supplements as recommended by your health care provider. What happens during an annual well check? The services and screenings done by your health care provider during your annual well check will depend on your age, overall health, lifestyle risk factors, and family history of disease. Counseling  Your health care provider may ask you questions about your: Alcohol use. Tobacco use. Drug use. Emotional well-being. Home and relationship well-being. Sexual activity. Eating habits. History of falls. Memory and ability to understand (cognition). Work and work Astronomer. Reproductive health. Screening  You may  have the following tests or measurements: Height, weight, and BMI. Blood pressure. Lipid and cholesterol levels. These may be checked every 5 years, or more frequently if you are over 69 years old. Skin check. Lung cancer screening. You may have this screening every year starting at age 35 if you have a 30-pack-year history of smoking and currently smoke or have quit within the past 15 years. Fecal occult blood test (FOBT) of the stool. You may have this test every year starting at age 9. Flexible sigmoidoscopy or colonoscopy. You may have a sigmoidoscopy every 5 years or a colonoscopy every 10 years starting at age 84. Hepatitis C blood test. Hepatitis B blood test. Sexually transmitted disease (STD) testing. Diabetes screening. This is done by checking your blood sugar (glucose) after you have not eaten for a while (fasting). You may have this done every 1-3 years. Bone density scan. This is done to screen for osteoporosis. You may have this done starting at age 50. Mammogram. This may be done every 1-2 years. Talk to your health care provider about how often you should have regular mammograms. Talk with your health care provider about your test results, treatment options, and if necessary, the need for more tests. Vaccines  Your health care provider may recommend certain vaccines, such as: Influenza vaccine. This is recommended every year. Tetanus, diphtheria, and acellular pertussis (Tdap, Td) vaccine. You may need a Td booster every 10 years. Zoster vaccine. You may need this after age 83. Pneumococcal 13-valent conjugate (PCV13) vaccine. One dose is recommended after age 49. Pneumococcal polysaccharide (PPSV23) vaccine. One dose is recommended after age 79. Talk to your health care provider about which screenings and vaccines you need and how often you need them. This information is not intended to replace advice  given to you by your health care provider. Make sure you discuss any  questions you have with your health care provider. Document Released: 12/19/2015 Document Revised: 08/11/2016 Document Reviewed: 09/23/2015 Elsevier Interactive Patient Education  2017 [employer].  Fall Prevention in the Home Falls can cause injuries. They can happen to people of all ages. There are many things you can do to make your home safe and to help prevent falls. What can I do on the outside of my home? Regularly fix the edges of walkways and driveways and fix any cracks. Remove anything that might make you trip as you walk through a door, such as a raised step or threshold. Trim any bushes or trees on the path to your home. Use bright outdoor lighting. Clear any walking paths of anything that might make someone trip, such as rocks or tools. Regularly check to see if handrails are loose or broken. Make sure that both sides of any steps have handrails. Any raised decks and porches should have guardrails on the edges. Have any leaves, snow, or ice cleared regularly. Use sand or salt on walking paths during winter. Clean up any spills in your garage right away. This includes oil or grease spills. What can I do in the bathroom? Use night lights. Install grab bars by the toilet and in the tub and shower. Do not use towel bars as grab bars. Use non-skid mats or decals in the tub or shower. If you need to sit down in the shower, use a plastic, non-slip stool. Keep the floor dry. Clean up any water that spills on the floor as soon as it happens. Remove soap buildup in the tub or shower regularly. Attach bath mats securely with double-sided non-slip rug tape. Do not have throw rugs and other things on the floor that can make you trip. What can I do in the bedroom? Use night lights. Make sure that you have a light by your bed that is easy to reach. Do not use any sheets or blankets that are too big for your bed. They should not hang down onto the floor. Have a firm chair that has side  arms. You can use this for support while you get dressed. Do not have throw rugs and other things on the floor that can make you trip. What can I do in the kitchen? Clean up any spills right away. Avoid walking on wet floors. Keep items that you use a lot in easy-to-reach places. If you need to reach something above you, use a strong step stool that has a grab bar. Keep electrical cords out of the way. Do not use floor polish or wax that makes floors slippery. If you must use wax, use non-skid floor wax. Do not have throw rugs and other things on the floor that can make you trip. What can I do with my stairs? Do not leave any items on the stairs. Make sure that there are handrails on both sides of the stairs and use them. Fix handrails that are broken or loose. Make sure that handrails are as long as the stairways. Check any carpeting to make sure that it is firmly attached to the stairs. Fix any carpet that is loose or worn. Avoid having throw rugs at the top or bottom of the stairs. If you do have throw rugs, attach them to the floor with carpet tape. Make sure that you have a light switch at the top of the stairs and the bottom of  the stairs. If you do not have them, ask someone to add them for you. What else can I do to help prevent falls? Wear shoes that: Do not have high heels. Have rubber bottoms. Are comfortable and fit you well. Are closed at the toe. Do not wear sandals. If you use a stepladder: Make sure that it is fully opened. Do not climb a closed stepladder. Make sure that both sides of the stepladder are locked into place. Ask someone to hold it for you, if possible. Clearly mark and make sure that you can see: Any grab bars or handrails. First and last steps. Where the edge of each step is. Use tools that help you move around (mobility aids) if they are needed. These include: Canes. Walkers. Scooters. Crutches. Turn on the lights when you go into a dark area.  Replace any light bulbs as soon as they burn out. Set up your furniture so you have a clear path. Avoid moving your furniture around. If any of your floors are uneven, fix them. If there are any pets around you, be aware of where they are. Review your medicines with your doctor. Some medicines can make you feel dizzy. This can increase your chance of falling. Ask your doctor what other things that you can do to help prevent falls. This information is not intended to replace advice given to you by your health care provider. Make sure you discuss any questions you have with your health care provider. Document Released: 09/18/2009 Document Revised: 04/29/2016 Document Reviewed: 12/27/2014 Elsevier Interactive Patient Education  2017 [employer].

## 2023-09-27 NOTE — Progress Notes (Signed)
Subjective:   [name] is a 57 y.o. female who presents for Medicare Annual (Subsequent) preventive examination.  Visit Complete: Virtual I connected with  Idalia Needle on 09/27/23 by a audio enabled telemedicine application and verified that I am speaking with the correct person using two identifiers.  Patient Location: Home  Provider Location: Home Office  I discussed the limitations of evaluation and management by telemedicine. The patient expressed understanding and agreed to proceed.  Vital Signs: Because this visit was a virtual/telehealth visit, some criteria may be missing or patient reported. Any vitals not documented were not able to be obtained and vitals that have been documented are patient reported.   Cardiac Risk Factors include: advanced age (>3men, >50 women);diabetes mellitus;hypertension     Objective:    There were no vitals filed for this visit. There is no height or weight on file to calculate BMI.     09/27/2023    8:43 AM 08/25/2023    8:34 AM 02/03/2023    3:27 PM 01/20/2023   11:02 AM 01/06/2023    1:24 PM 12/14/2022    9:45 AM 06/24/2022    1:47 PM  Advanced Directives  Does Patient Have a Medical Advance Directive? No No No No No No No  Would patient like information on creating a medical advance directive? No - Patient declined  No - Patient declined No - Patient declined No - Patient declined No - Patient declined     Current Medications (verified) Outpatient Encounter Medications as of 09/27/2023  Medication Sig   amlodipine-olmesartan (AZOR) 10-20 MG tablet TAKE 1 TABLET BY MOUTH DAILY.   atorvastatin (LIPITOR) 40 MG tablet TAKE 1 TABLET (40 MG TOTAL) BY MOUTH DAILY.   blood glucose meter kit and supplies KIT Dispense based on patient and insurance preference. Use up to four times daily as directed.   blood glucose meter kit and supplies Dispense based on patient and insurance preference. Use up to four times daily as directed. (FOR  ICD-10 E10.9, E11.9).   Blood Pressure Monitoring (ADULT BLOOD PRESSURE CUFF LG) KIT 1 each by Does not apply route daily.   gabapentin (NEURONTIN) 100 MG capsule TAKE 1 CAPSULE (100 MG TOTAL) BY MOUTH 3 (THREE) TIMES DAILY.   glucose blood (ACCU-CHEK GUIDE) test strip User to check blood sugar up to 4x per day e11.9   hydrALAZINE (APRESOLINE) 10 MG tablet TAKE 1 TABLET (10 MG TOTAL) BY MOUTH 3 (THREE) TIMES DAILY.   Insulin Pen Needle (EASY TOUCH PEN NEEDLES) 32G X 4 MM MISC 1 each by Does not apply route 3 (three) times daily.   Lancet Devices (ONETOUCH DELICA PLUS LANCING) MISC CHECK BLOOD SUGARS FOURX DAILY   Lancets (ONETOUCH DELICA PLUS LANCET33G) MISC USE TO TEST BLOOD SUGAR AS DIRECTED   LANTUS SOLOSTAR 100 UNIT/ML Solostar Pen Inject 10 Units into the skin daily.   B Complex-C-Folic Acid (DIALYVITE 800) 0.8 MG TABS Take 1 tablet by mouth daily.  (Patient not taking: Reported on 09/27/2023)   carvedilol (COREG) 25 MG tablet Take 25 mg by mouth 2 (two) times daily with a meal. (Patient not taking: Reported on 09/27/2023)   ferric citrate (AURYXIA) 1 GM 210 MG(Fe) tablet  (Patient not taking: Reported on 07/05/2023)   Na Sulfate-K Sulfate-Mg Sulf (SUPREP BOWEL PREP KIT) 17.5-3.13-1.6 GM/177ML SOLN Take 1 kit by mouth as directed. (Patient not taking: Reported on 09/27/2023)   No facility-administered encounter medications on file as of 09/27/2023.    Allergies (verified)  Patient has no known allergies.   History: Past Medical History:  Diagnosis Date   Alcohol dependency (HCC)    Anemia    Appendicitis 12/06/2018   Chronic kidney disease    Diabetes mellitus    type 2   Diabetes mellitus type 2, uncontrolled    End stage renal disease (HCC) 05/05/2016   Starting 04/2016, LUE fistula   Enterococcal bacteremia    Hypertension    Legally blind in left eye, as defined in [place]    Liver abscess 12/20/2018   Necrotizing fasciitis (HCC)    PONV (postoperative nausea and vomiting)     Sepsis without acute organ dysfunction (HCC)    Type 2 diabetes mellitus with diabetic chronic kidney disease (HCC) 05/05/2016   Past Surgical History:  Procedure Laterality Date   AMPUTATION     right first and left middle finger amputation 2/2 OM 08/2011 by Dr. Amanda Pea   AMPUTATION  01/18/2012   Procedure: AMPUTATION DIGIT;  Surgeon: Tami Ribas, MD;  Location:  SUR[facility]ERY CENTER;  Service: Orthopedics;  Laterality: Left;  revision amputation left thumb   AMPUTATION  08/25/2012   Procedure: AMPUTATION BELOW KNEE;  Surgeon: Nadara Mustard, MD;  Location: [facility] OR;  Service: Orthopedics;  Laterality: Right;  Right Below Knee Amputation   APPENDECTOMY     BASCILIC VEIN TRANSPOSITION Left 07/22/2015   Procedure: BASILIC VEIN TRANSPOSITION ;  Surgeon: Sherren Kerns, MD;  Location: [facility] OR;  Service: Vascular;  Laterality: Left;   CATARACT EXTRACTION     right    CESAREAN SECTION  1986   CESAREAN SECTION  1985   CESAREAN SECTION  1991   COLONOSCOPY WITH PROPOFOL N/A 08/25/2023   Procedure: COLONOSCOPY WITH PROPOFOL;  Surgeon: Meryl Dare, MD;  Location: [facility];  Service: Gastroenterology;  Laterality: N/A;   I & D EXTREMITY  12/24/2011   Procedure: IRRIGATION AND DEBRIDEMENT EXTREMITY;  Surgeon: Tami Ribas, MD;  Location: [facility] OR;  Service: Orthopedics;  Laterality: Left;  I&Dleft thumb with partial amputation   I & D EXTREMITY  12/26/2011   Procedure: IRRIGATION AND DEBRIDEMENT EXTREMITY;  Surgeon: Tami Ribas, MD;  Location: [facility] OR;  Service: Orthopedics;  Laterality: Left;   I & D EXTREMITY  12/23/2011   Procedure: IRRIGATION AND DEBRIDEMENT EXTREMITY;  Surgeon: Tami Ribas, MD;  Location: [facility] OR;  Service: Orthopedics;  Laterality: Left;  I&D Left thumb, hand and arm   I&D left thumb  12/23/2011   IR RADIOLOGIST EVAL & MGMT  01/04/2019   IR RADIOLOGIST EVAL & MGMT  01/25/2019   LAPAROSCOPIC APPENDECTOMY N/A 12/06/2018   Procedure: APPENDECTOMY LAPAROSCOPIC;  Surgeon: Kinsinger,  De Blanch, MD;  Location: [facility] OR;  Service: General;  Laterality: N/A;   POLYPECTOMY  08/25/2023   Procedure: POLYPECTOMY;  Surgeon: Meryl Dare, MD;  Location: [facility];  Service: Gastroenterology;;   TUBAL LIGATION     Family History  Problem Relation Age of Onset   Diabetes Mother    Alcohol abuse Mother    Diabetes Father    Alzheimer's disease Father    Hypertension Father    Kidney failure Sister    Diabetes Sister    High blood pressure Sister    Social History   Socioeconomic History   Marital status: Single    Spouse name: Not on file   Number of children: 3   Years of education: 10   Highest education level: Not on file  Occupational History  Employer: [employer]  Tobacco Use   Smoking status: Never   Smokeless tobacco: Never  Vaping Use   Vaping status: Never Used  Substance and Sexual Activity   Alcohol use: Yes    Comment: h/o alcohol abuse 4 years ago (07/21/15)   Drug use: Yes    Types: Cocaine    Comment:  last used cocaine 4 years ago (07/21/15)   Sexual activity: Yes    Birth control/protection: Surgical  Other Topics Concern   Not on file  Social History Narrative   Patient lives with her boyfriend in [place].    Patient enjoys watching TV, and spending time with her children and grandchildren.    Patient is legally blind and is in a wheel chair. Patient relies on [employer] transportation.    Social Determinants of Health   Financial Resource Strain: Low Risk  (09/27/2023)   Overall Financial Resource Strain (CARDIA)    Difficulty of Paying Living Expenses: Not hard at all  Food Insecurity: No Food Insecurity (09/27/2023)   Hunger Vital Sign    Worried About Running Out of Food in the Last Year: Never true    Ran Out of Food in the Last Year: Never true  Transportation Needs: No Transportation Needs (09/27/2023)   PRAPARE - Administrator, Civil Service (Medical): No    Lack of Transportation (Non-Medical): No  Physical Activity:  Inactive (09/27/2023)   Exercise Vital Sign    Days of Exercise per Week: 0 days    Minutes of Exercise per Session: 0 min  Stress: No Stress Concern Present (09/27/2023)   [employer] of Occupational Health - Occupational Stress Questionnaire    Feeling of Stress : Not at all  Social Connections: Unknown (09/27/2023)   Social Connection and Isolation Panel [NHANES]    Frequency of Communication with Friends and Family: More than three times a week    Frequency of Social Gatherings with Friends and Family: Once a week    Attends Religious Services: More than 4 times per year    Active Member of [employer] or Organizations: No    Attends Engineer, structural: Never    Marital Status: Not on file    Tobacco Counseling Counseling given: Not Answered   Clinical Intake:  Pre-visit preparation completed: Yes  Pain : No/denies pain     Diabetes: Yes CBG done?: No Did pt. bring in CBG monitor from home?: No  How often do you need to have someone help you when you read instructions, pamphlets, or other written materials from your doctor or pharmacy?: 1 - Never  Interpreter Needed?: No  Information entered by :: Remi Haggard LPN   Activities of Daily Living    09/27/2023    8:43 AM  In your present state of health, do you have any difficulty performing the following activities:  Vision? 1  Difficulty concentrating or making decisions? 0  Walking or climbing stairs? 1  Dressing or bathing? 0  Doing errands, shopping? 1  Preparing Food and eating ? N  Using the Toilet? N  In the past six months, have you accidently leaked urine? N  Do you have problems with loss of bowel control? N  Managing your Medications? N  Managing your Finances? N  Housekeeping or managing your Housekeeping? N    Patient Care Team: Vonna Drafts, MD as PCP - General (Family Medicine) Arita Miss, MD as Attending Physician (Nephrology) Juanell Fairly, RN as [facility]  Indicate any recent Medical Services you may have received from other than [facility] providers in the past year (date may be approximate).     Assessment:   This is a routine wellness examination for [place].  Hearing/Vision screen Hearing Screening - Comments:: No trouble hearing  Vision Screening - Comments:: [facility] Up to date   Goals Addressed             This Visit's Progress    Patient Stated       Continue current lifestyle       Depression Screen    09/27/2023    8:48 AM 04/05/2023   10:44 AM 02/03/2023    3:28 PM 01/20/2023   11:01 AM 01/06/2023    1:23 PM 08/19/2022    8:48 AM 06/24/2022    1:46 PM  PHQ 2/9 Scores  PHQ - 2 Score 0 0 0 0  0 2  PHQ- 9 Score 0 0 0 0  0 2  Exception Documentation     Patient refusal      Fall Risk    09/27/2023    8:42 AM 01/20/2023   11:01 AM 04/29/2022   10:35 AM 06/03/2020    9:06 AM 03/20/2020   10:48 AM  Fall Risk   Falls in the past year? 0 0 0 0 0  Number falls in past yr: 0 0  0 0  Injury with Fall? 0 0  0 0  Risk for fall due to : Impaired balance/gait;Impaired mobility;Impaired vision      Follow up Education provided;Falls evaluation completed;Falls prevention discussed        MEDICARE RISK AT HOME: Medicare Risk at Home Home free of loose throw rugs in walkways, pet beds, electrical cords, etc?: Yes Adequate lighting in your home to reduce risk of falls?: Yes Life alert?: No Use of a cane, walker or w/c?: Yes Grab bars in the bathroom?: Yes Shower chair or bench in shower?: Yes Elevated toilet seat or a handicapped toilet?: No  TIMED UP AND GO:  Was the test performed?  No    Cognitive Function:        09/27/2023    8:44 AM 12/12/2019    2:50 PM  6CIT Screen  What Year? 0 points 0 points  What month? 0 points 0 points  What time? 0 points 0 points  Count back from 20 0 points 0 points  Months in reverse 4 points 4 points  Repeat phrase 2 points 0 points  Total Score 6 points 4 points     Immunizations Immunization History  Administered Date(s) Administered   Hepatitis B, ADULT 05/12/2016, 06/09/2016, 07/09/2016, 11/05/2016, 01/06/2018   Influenza Split 08/24/2012   Influenza, Quadrivalent, Recombinant, Inj, Pf 09/03/2019   Influenza,inj,Quad PF,6+ Mos 08/11/2021   Influenza-Unspecified 10/06/2016, 08/06/2017, 08/30/2018, 09/06/2019   Moderna Sars-Covid-2 Vaccination 02/13/2020, 03/14/2020   PFIZER Comirnaty(Gray Top)Covid-19 Tri-Sucrose Vaccine 01/13/2021   Pneumococcal Conjugate-13 09/29/2016   Pneumococcal Polysaccharide-23 07/14/2015   Tdap 09/08/2017    TDAP status: Up to date  Flu Vaccine status: Up to date    Covid-19 vaccine status: Information provided on how to obtain vaccines.   Qualifies for Shingles Vaccine? Yes   Zostavax completed No   Shingrix Completed?: No.    Education has been provided regarding the importance of this vaccine. Patient has been advised to call insurance company to determine out of pocket expense if they have not yet received this vaccine. Advised may also receive vaccine at local pharmacy or  Health Dept. Verbalized acceptance and understanding.  Screening Tests Health Maintenance  Topic Date Due   Zoster Vaccines- Shingrix (1 of 2) Never done   OPHTHALMOLOGY EXAM  10/20/2019   LIPID PANEL  01/19/2023   FOOT EXAM  04/28/2023   COVID-19 Vaccine (4 - 2023-24 season) 08/07/2023   MAMMOGRAM  09/21/2024   Medicare Annual Wellness (AWV)  09/26/2024   Cervical Cancer Screening (HPV/Pap Cotest)  01/19/2027   DTaP/Tdap/Td (2 - Td or Tdap) 09/09/2027   Colonoscopy  08/24/2033   INFLUENZA VACCINE  Completed   Hepatitis C Screening  Completed   HIV Screening  Completed   HPV VACCINES  Aged Out    Health Maintenance  Health Maintenance Due  Topic Date Due   Zoster Vaccines- Shingrix (1 of 2) Never done   OPHTHALMOLOGY EXAM  10/20/2019   LIPID PANEL  01/19/2023   FOOT EXAM  04/28/2023   COVID-19 Vaccine (4 - 2023-24  season) 08/07/2023    Colorectal cancer screening: Type of screening: Colonoscopy. Completed 2024. Repeat every 10 years  Mammogram   scheduled 10-20-2023    Lung Cancer Screening: (Low Dose CT Chest recommended if Age 25-80 years, 20 pack-year currently smoking OR have quit w/in 15years.) does not qualify.   Lung Cancer Screening Referral:   Additional Screening:  Hepatitis C Screening: does not qualify; Completed 2022  Vision Screening: Recommended annual ophthalmology exams for early detection of glaucoma and other disorders of the eye. Is the patient up to date with their annual eye exam?  Yes  Who is the provider or what is the name of the office in which the patient attends annual eye exams? [facility] If pt is not established with a provider, would they like to be referred to a provider to establish care? No .   Dental Screening: Recommended annual dental exams for proper oral hygiene  Nutrition Risk Assessment:  Has the patient had any N/V/D within the last 2 months?  No  Does the patient have any non-healing wounds?  No  Has the patient had any unintentional weight loss or weight gain?  No   Diabetes:  Is the patient diabetic?  Yes  If diabetic, was a CBG obtained today?  No  Did the patient bring in their glucometer from home?  No  How often do you monitor your CBG's? X 3 a week.   Financial Strains and Diabetes Management:  Are you having any financial strains with the device, your supplies or your medication? No .  Does the patient want to be seen by Chronic Care Management for management of their diabetes?  No  Would the patient like to be referred to a Nutritionist or for Diabetic Management?  No   Diabetic Exams:  Diabetic Eye Exam:  Pt has been advised about the importance in completing this exam.  Diabetic Foot Exam: Pt has been advised about the importance in completing this exam.  Community Resource Referral / Chronic Care Management: CRR required this  visit?  No   CCM required this visit?  No     Plan:     I have personally reviewed and noted the following in the patient's chart:   Medical and social history Use of alcohol, tobacco or illicit drugs  Current medications and supplements including opioid prescriptions. Patient is not currently taking opioid prescriptions. Functional ability and status Nutritional status Physical activity Advanced directives List of other physicians Hospitalizations, surgeries, and ER visits in previous 12 months Vitals Screenings to include  cognitive, depression, and falls Referrals and appointments  In addition, I have reviewed and discussed with patient certain preventive protocols, quality metrics, and best practice recommendations. A written personalized care plan for preventive services as well as general preventive health recommendations were provided to patient.     Remi Haggard, LPN   [id number]/21/[id number]   After Visit Summary: (MyChart) Due to this being a telephonic visit, the after visit summary with patients personalized plan was offered to patient via MyChart   Nurse Notes:

## 2023-10-02 ENCOUNTER — Encounter (HOSPITAL_COMMUNITY): Payer: Self-pay

## 2023-10-02 ENCOUNTER — Emergency Department (HOSPITAL_COMMUNITY)
Admission: EM | Admit: 2023-10-02 | Discharge: 2023-10-02 | Disposition: A | Discharge status: Home / Self Care | Payer: 59 | Attending: Emergency Medicine | Admitting: Emergency Medicine

## 2023-10-02 DIAGNOSIS — N186 End stage renal disease: Secondary | ICD-10-CM | POA: Diagnosis not present

## 2023-10-02 DIAGNOSIS — I120 Hypertensive chronic kidney disease with stage 5 chronic kidney disease or end stage renal disease: Secondary | ICD-10-CM | POA: Insufficient documentation

## 2023-10-02 DIAGNOSIS — E1122 Type 2 diabetes mellitus with diabetic chronic kidney disease: Secondary | ICD-10-CM | POA: Diagnosis not present

## 2023-10-02 DIAGNOSIS — T7840XA Allergy, unspecified, initial encounter: Secondary | ICD-10-CM | POA: Diagnosis not present

## 2023-10-02 DIAGNOSIS — Z794 Long term (current) use of insulin: Secondary | ICD-10-CM | POA: Diagnosis not present

## 2023-10-02 LAB — CBC WITH DIFFERENTIAL/PLATELET
Abs Immature Granulocytes: 0.06 10*3/uL (ref 0.00–0.07)
Basophils Absolute: 0 10*3/uL (ref 0.0–0.1)
Basophils Relative: 0 %
Eosinophils Absolute: 0.1 10*3/uL (ref 0.0–0.5)
Eosinophils Relative: 1 %
HCT: 33.6 % — ABNORMAL LOW (ref 36.0–46.0)
Hemoglobin: 10.1 g/dL — ABNORMAL LOW (ref 12.0–15.0)
Immature Granulocytes: 1 %
Lymphocytes Relative: 15 %
Lymphs Abs: 1.6 10*3/uL (ref 0.7–4.0)
MCH: 24.2 pg — ABNORMAL LOW (ref 26.0–34.0)
MCHC: 30.1 g/dL (ref 30.0–36.0)
MCV: 80.4 fL (ref 80.0–100.0)
Monocytes Absolute: 0.7 10*3/uL (ref 0.1–1.0)
Monocytes Relative: 6 %
Neutro Abs: 8 10*3/uL — ABNORMAL HIGH (ref 1.7–7.7)
Neutrophils Relative %: 77 %
Platelets: 222 10*3/uL (ref 150–400)
RBC: 4.18 MIL/uL (ref 3.87–5.11)
RDW: 18.4 % — ABNORMAL HIGH (ref 11.5–15.5)
WBC: 10.5 10*3/uL (ref 4.0–10.5)
nRBC: 0 % (ref 0.0–0.2)

## 2023-10-02 LAB — COMPREHENSIVE METABOLIC PANEL
ALT: 13 U/L (ref 0–44)
AST: 23 U/L (ref 15–41)
Albumin: 4.1 g/dL (ref 3.5–5.0)
Alkaline Phosphatase: 69 U/L (ref 38–126)
Anion gap: 15 (ref 5–15)
BUN: 63 mg/dL — ABNORMAL HIGH (ref 6–20)
CO2: 29 mmol/L (ref 22–32)
Calcium: 10.3 mg/dL (ref 8.9–10.3)
Chloride: 96 mmol/L — ABNORMAL LOW (ref 98–111)
Creatinine, Ser: 9.24 mg/dL — ABNORMAL HIGH (ref 0.44–1.00)
GFR, Estimated: 5 mL/min — ABNORMAL LOW (ref >60)
Glucose, Bld: 157 mg/dL — ABNORMAL HIGH (ref 70–99)
Potassium: 5.5 mmol/L — ABNORMAL HIGH (ref 3.5–5.1)
Sodium: 140 mmol/L (ref 135–145)
Total Bilirubin: 0.8 mg/dL (ref 0.3–1.2)
Total Protein: 7.6 g/dL (ref 6.5–8.1)

## 2023-10-02 MED ORDER — FAMOTIDINE 20 MG PO TABS
20.0000 mg | ORAL_TABLET | Freq: Once | ORAL | Status: AC
  Administered 2023-10-02: 20 mg via ORAL
  Filled 2023-10-02: qty 1

## 2023-10-02 MED ORDER — METHYLPREDNISOLONE SODIUM SUCC 125 MG IJ SOLR
125.0000 mg | Freq: Once | INTRAMUSCULAR | Status: AC
  Administered 2023-10-02: 125 mg via INTRAVENOUS
  Filled 2023-10-02: qty 2

## 2023-10-02 MED ORDER — DIPHENHYDRAMINE HCL 50 MG/ML IJ SOLN
25.0000 mg | Freq: Once | INTRAMUSCULAR | Status: AC
  Administered 2023-10-02: 25 mg via INTRAVENOUS
  Filled 2023-10-02: qty 1

## 2023-10-02 MED ORDER — EPINEPHRINE 0.3 MG/0.3ML IJ SOAJ
0.3000 mg | INTRAMUSCULAR | 0 refills | Status: AC | PRN
Start: 2023-10-02 — End: ?

## 2023-10-02 MED ORDER — ONDANSETRON HCL 4 MG/2ML IJ SOLN
4.0000 mg | Freq: Once | INTRAMUSCULAR | Status: AC
  Administered 2023-10-02: 4 mg via INTRAVENOUS
  Filled 2023-10-02: qty 2

## 2023-10-02 NOTE — Discharge Instructions (Addendum)
Thank you for coming to [facility]. You were seen for nausea/vomiting and facial swelling. We did an exam, labs, and these showed likely an allergic reaction, possibly to shrimp. Please avoid seafood for the time being until you can have allergy testing done.  Please follow up with your dilaysis center in the morning and with an allergist. You can call them to make an appointment.  We have prescribed you an EpiPen to use if you experience hives/rashes, nausea/vomiting, wheezing/shortness of breath, or lightheadedness/loss of consciousness. If you use an EpiPen, you need to come to the emergency department.  Do not hesitate to return to the ED or call 911 if you experience: -Worsening symptoms -Shortness of breath -Hives, rashes -Wheezing -Lightheadedness, passing out -Fevers/chills -Anything else that concerns you

## 2023-10-02 NOTE — ED Provider Notes (Signed)
EMERGENCY DEPARTMENT AT [facility] Provider Note   CSN: [id number] Arrival date & time: 10/02/23  1724     History  Chief Complaint  Patient presents with   Allergic Reaction    [name] is a 57 y.o. female with PMH as listed below who presents with c/o allergic reaction after eating shrimp this afternoon. Pt does not know if she has an allergy to shrimp, but she has not eaten shrimp in many years prior to this.  She endorses nausea and vomiting that started immediately after eating the shrimp as well as diffuse facial swelling.  Denies any tongue or throat swelling.  She denies any lightheadedness, shortness of breath, wheezing, rash, hives.  This is never happened to her before.  Boyfriend at bedside also ate the shrimp and he does not have any illness.  Has no other allergies that she is aware of.  Past Medical History:  Diagnosis Date   Alcohol dependency (HCC)    Anemia    Appendicitis 12/06/2018   Chronic kidney disease    Diabetes mellitus    type 2   Diabetes mellitus type 2, uncontrolled    End stage renal disease (HCC) 05/05/2016   Starting 04/2016, LUE fistula   Enterococcal bacteremia    Hypertension    Legally blind in left eye, as defined in [place]    Liver abscess 12/20/2018   Necrotizing fasciitis (HCC)    PONV (postoperative nausea and vomiting)    Sepsis without acute organ dysfunction (HCC)    Type 2 diabetes mellitus with diabetic chronic kidney disease (HCC) 05/05/2016       Home Medications Prior to Admission medications   Medication Sig Start Date End Date Taking? Authorizing Provider  amlodipine-olmesartan (AZOR) 10-20 MG tablet TAKE 1 TABLET BY MOUTH DAILY. 09/14/23  Yes Vonna Drafts, MD  atorvastatin (LIPITOR) 40 MG tablet TAKE 1 TABLET (40 MG TOTAL) BY MOUTH DAILY. 03/15/23  Yes Vonna Drafts, MD  carvedilol (COREG) 6.25 MG tablet Take 6.25 mg by mouth 2 (two) times daily with a meal.   Yes [provider]   EPINEPHrine 0.3 mg/0.3 mL IJ SOAJ injection Inject 0.3 mg into the muscle as needed for anaphylaxis. 10/02/23  Yes Loetta Rough, MD  gabapentin (NEURONTIN) 100 MG capsule TAKE 1 CAPSULE (100 MG TOTAL) BY MOUTH 3 (THREE) TIMES DAILY. 07/12/23  Yes Vonna Drafts, MD  hydrALAZINE (APRESOLINE) 10 MG tablet TAKE 1 TABLET (10 MG TOTAL) BY MOUTH 3 (THREE) TIMES DAILY. 08/10/23  Yes Vonna Drafts, MD  LANTUS SOLOSTAR 100 UNIT/ML Solostar Pen Inject 10 Units into the skin daily. 01/20/23  Yes Vonna Drafts, MD  omeprazole (PRILOSEC) 40 MG capsule Take 40 mg by mouth daily before breakfast.   Yes [provider]  sevelamer carbonate (RENVELA) 800 MG tablet Take 800-1,600 mg by mouth 3 (three) times daily with meals. Take 1,600 mg by mouth three times a day with meals and 800 mg with snacks   Yes [provider]  TYLENOL 500 MG tablet Take 500-1,000 mg by mouth every 6 (six) hours as needed for mild pain (pain score 1-3) or headache.   Yes [provider]  B Complex-C-Folic Acid (DIALYVITE 800) 0.8 MG TABS Take 1 tablet by mouth daily. 10/09/18   [provider]  blood glucose meter kit and supplies KIT Dispense based on patient and insurance preference. Use up to four times daily as directed. 08/12/22   Vonna Drafts, MD  blood glucose  meter kit and supplies Dispense based on patient and insurance preference. Use up to four times daily as directed. (FOR ICD-10 E10.9, E11.9). 11/21/20   Shirlean Mylar, MD  Blood Pressure Monitoring (ADULT BLOOD PRESSURE CUFF LG) KIT 1 each by Does not apply route daily. 04/27/22   Shirlean Mylar, MD  ferric citrate Gean Quint) 1 GM 210 MG(Fe) tablet  05/22/19   [provider]  glucose blood (ACCU-CHEK GUIDE) test strip User to check blood sugar up to 4x per day e11.9 10/07/22   Vonna Drafts, MD  Insulin Pen Needle (EASY TOUCH PEN NEEDLES) 32G X 4 MM MISC 1 each by Does not apply route 3 (three) times daily. 08/10/18   Caro Laroche, DO   Lancet Devices [facility] DELICA PLUS LANCING) MISC CHECK BLOOD SUGARS FOURX DAILY 12/03/20   Shirlean Mylar, MD  Lancets [facility] DELICA PLUS [place]) MISC USE TO TEST BLOOD SUGAR AS DIRECTED 12/11/21   Shirlean Mylar, MD  Na Sulfate-K Sulfate-Mg Sulf (SUPREP BOWEL PREP KIT) 17.5-3.13-1.6 GM/177ML SOLN Take 1 kit by mouth as directed. Patient not taking: Reported on 10/02/2023 07/05/23   Meredith Pel, NP      Allergies    Shrimp extract    Review of Systems   Review of Systems A 10 point review of systems was performed and is negative unless otherwise reported in HPI.  Physical Exam Updated Vital Signs BP (!) 176/83 (BP Location: Right Arm)   Pulse 82   Temp 98.4 F (36.9 C) (Oral)   Resp 18   LMP 03/07/2019   SpO2 100%  Physical Exam General: Normal appearing female, lying in bed.  HEENT: PERRLA, EOMI, quiet conjunctivae, Sclera anicteric, Notable bilateral periorbital edema without erythema, induration, or fluctuance. MMM, trachea midline. Clear oropharynx. No tongue or throat swelling.  Cardiology: RRR, no murmurs/rubs/gallops.  Resp: Normal respiratory rate and effort. CTAB, no wheezes, rhonchi, crackles.  Abd: Soft, non-tender, non-distended. No rebound tenderness or guarding.  GU: Deferred. MSK: S/p R BKA. No peripheral edema or signs of trauma. Extremities without deformity or TTP. Skin: warm, dry. No rashes or lesions. No hives. Neuro: A&Ox4, CNs II-XII grossly intact. MAEs. Sensation grossly intact.  Psych: Normal mood and affect.   ED Results / Procedures / Treatments   Labs (all labs ordered are listed, but only abnormal results are displayed) Labs Reviewed  CBC WITH DIFFERENTIAL/PLATELET - Abnormal; Notable for the following components:      Result Value   Hemoglobin 10.1 (*)    HCT 33.6 (*)    MCH 24.2 (*)    RDW 18.4 (*)    Neutro Abs 8.0 (*)    All other components within normal limits  COMPREHENSIVE METABOLIC PANEL - Abnormal; Notable for the  following components:   Potassium 5.5 (*)    Chloride 96 (*)    Glucose, Bld 157 (*)    BUN 63 (*)    Creatinine, Ser 9.24 (*)    GFR, Estimated 5 (*)    All other components within normal limits    EKG None  Radiology No results found.  Procedures Procedures    Medications Ordered in ED Medications  diphenhydrAMINE (BENADRYL) injection 25 mg (25 mg Intravenous Given 10/02/23 1830)  methylPREDNISolone sodium succinate (SOLU-MEDROL) 125 mg/2 mL injection 125 mg (125 mg Intravenous Given 10/02/23 1830)  famotidine (PEPCID) tablet 20 mg (20 mg Oral Given 10/02/23 1808)  ondansetron (ZOFRAN) injection 4 mg (4 mg Intravenous Given 10/02/23 1831)    ED Course/ Medical Decision  Making/ A&P                          Medical Decision Making Amount and/or Complexity of Data Reviewed Labs:  Decision-making details documented in ED Course.  Risk Prescription drug management.    This patient presents to the ED for concern of possible allergic reaction, this involves an extensive number of treatment options, and is a complaint that carries with it a high risk of complications and morbidity.  I considered the following differential and admission for this acute, potentially life threatening condition.   MDM:    Patient presents with acute onset nausea vomiting after eating shrimp this afternoon associated with facial swelling.  She does not have any rash or hives on exam, no wheezing shortness of breath lightheadedness or hypotension to indicate anaphylaxis.  It does appear though that she is having an allergic reaction.  Will treat her with Solu-Medrol, famotidine, Benadryl, and Zofran.  Lower concern for food poisoning as her partner at bedside did eat the same food and does not feel sick.  She has no renal injury or electrolyte abnormalities on her labs.  Her abdominal exam is benign and she is afebrile with no leukocytosis, no concern for an intra-abdominal infection.  Abdomen is  nondistended and her nausea vomiting has improved, low concern for small bowel obstruction as well which would also not explain her facial swelling.  Patient is observed for the emergency department for >2.5 hours in the ED with improvement of her symptoms.  Discussed with the patient about a prescription for an EpiPen and when and how to use it.  Also recommend follow-up with an allergist.  Clinical Course as of 10/02/23 1959  Sun Oct 02, 2023  1953 Creatinine(!): 9.24 [HN]  1954 BUN(!): 63 Mild elevation from baseline, but in s/o known CKD. Next dialysis is tomorrow morning. K 5.5, similar to prior as well, will be going to dialysis. [HN]  1955 Hemoglobin(!): 10.1 Mild decrease from BL, no bleeding reported in s/o CKD/ACD. [HN]  1954 Pt reevaluated. She was observed in ED for >2.5 hours with improvement in her symptoms. She has improved facial swelling. Still no throat/tongue swelling or hives. Advised benadryl at home if symptoms continue. She tolerated PO here in the ED. Will be going to dialyssi tomorrow morning. DC w/ discharge instructions/return precautions. All questions answered to patient's satisfaction.   [HN]    Clinical Course User Index [HN] Loetta Rough, MD    Labs: I Ordered, and personally interpreted labs.  The pertinent results include: CBC, CMP  Additional history obtained from chart review, boyfriend at bedside  Reevaluation: After the interventions noted above, I reevaluated the patient and found that they have :improved  Social Determinants of Health: Lives independently  Disposition:  DC  Co morbidities that complicate the patient evaluation  Past Medical History:  Diagnosis Date   Alcohol dependency (HCC)    Anemia    Appendicitis 12/06/2018   Chronic kidney disease    Diabetes mellitus    type 2   Diabetes mellitus type 2, uncontrolled    End stage renal disease (HCC) 05/05/2016   Starting 04/2016, LUE fistula   Enterococcal bacteremia     Hypertension    Legally blind in left eye, as defined in [place]    Liver abscess 12/20/2018   Necrotizing fasciitis (HCC)    PONV (postoperative nausea and vomiting)    Sepsis without acute organ dysfunction (HCC)  Type 2 diabetes mellitus with diabetic chronic kidney disease (HCC) 05/05/2016     Medicines Meds ordered this encounter  Medications   diphenhydrAMINE (BENADRYL) injection 25 mg   methylPREDNISolone sodium succinate (SOLU-MEDROL) 125 mg/2 mL injection 125 mg    IV methylprednisolone will be converted to either a q12h or q24h frequency with the same total daily dose (TDD).  Ordered Dose: 1 to 125 mg TDD; convert to: TDD q24h.  Ordered Dose: 126 to 250 mg TDD; convert to: TDD div q12h.  Ordered Dose: >250 mg TDD; DAW.   famotidine (PEPCID) tablet 20 mg   ondansetron (ZOFRAN) injection 4 mg   EPINEPHrine 0.3 mg/0.3 mL IJ SOAJ injection    Sig: Inject 0.3 mg into the muscle as needed for anaphylaxis.    Dispense:  1 each    Refill:  0    I have reviewed the patients home medicines and have made adjustments as needed  Problem List / ED Course: Problem List Items Addressed This Visit   None Visit Diagnoses     Allergic reaction, initial encounter    -  Primary                   This note was created using dictation software, which may contain spelling or grammatical errors.    Loetta Rough, MD 10/02/23 [phone]

## 2023-10-02 NOTE — ED Triage Notes (Signed)
Pt presents with c/o allergic reaction after eating shrimp this afternoon. Pt does not know if she has an allergy to shrimp. Pt has noted facial swelling, no difficulty breathing at this time but did began vomiting after eating shrimp.

## 2023-10-04 ENCOUNTER — Other Ambulatory Visit: Payer: Self-pay

## 2023-10-04 MED ORDER — ATORVASTATIN CALCIUM 40 MG PO TABS: 40.0000 mg | ORAL_TABLET | Freq: Every day | ORAL | 0 refills | Status: DC

## 2023-10-20 ENCOUNTER — Ambulatory Visit: Payer: 59

## 2023-11-01 ENCOUNTER — Ambulatory Visit: Payer: 59 | Admitting: Family Medicine

## 2023-11-04 ENCOUNTER — Other Ambulatory Visit: Payer: Self-pay | Admitting: Family Medicine

## 2023-11-04 DIAGNOSIS — I159 Secondary hypertension, unspecified: Secondary | ICD-10-CM

## 2023-11-10 ENCOUNTER — Ambulatory Visit
Admission: RE | Admit: 2023-11-10 | Discharge: 2023-11-10 | Disposition: A | Discharge status: Home / Self Care | Payer: 59 | Source: Ambulatory Visit | Attending: Internal Medicine | Admitting: Internal Medicine

## 2023-11-10 ENCOUNTER — Encounter (HOSPITAL_COMMUNITY): Payer: Self-pay

## 2023-11-10 DIAGNOSIS — Z1231 Encounter for screening mammogram for malignant neoplasm of breast: Secondary | ICD-10-CM

## 2023-11-21 NOTE — Progress Notes (Signed)
    SUBJECTIVE:   CHIEF COMPLAINT / HPI:   Diabetes Current Regimen: Lantus 5U  CBGs: Checking sugars at home and reports fasting sugar mostly around 90-100 but occasional low.  Did not remember exact low value but states she gets symptomatic with sweating and N/V. Last A1c:  Lab Results  Component Value Date   HGBA1C 6.2 11/22/2023    Denies polyuria, polydipsia.  Last Eye Exam: Seen every 6 month - Dr. Allena Katz. Statin: Atorvastatin 40mg  ACE/ARB: Olmesartan 20mg    Hypertension: ESRD patient, seeing nephrology for hypertension management.  Discussed with patient today to follow-up with them regarding blood pressure concerns. - Medications: Amlodipine 10mg -Olmesartan 20mg , Hydralazine 10mg  TID, Coreg 25mg  BID - Compliance: Yes  Chest pressure, left arm pain Reports she had an episode about 2 weeks ago that lasted about all day.  States it felt like pressure and tingling in her chest.  States the pain radiated down her left arm.  Occurred at rest.  Does not currently see a cardiologist but would like to.  Has not had subsequent episode during that time.  Reports taking atorvastatin daily as prescribed.   PERTINENT  PMH / PSH: HTN, ESRD on HD, T2DM, right BKA  OBJECTIVE:   BP 138/76   Pulse 97   Wt 185 lb (83.9 kg)   LMP 03/07/2019   SpO2 99%   BMI 28.98 kg/m    General: Alert, no apparent distress, well groomed HEENT: Normocephalic, atraumatic, moist mucus membranes, neck supple Respiratory: Normal respiratory effort GI: Non-distended Skin: No rashes, no jaundice Psych: Appropriate mood and affect   ASSESSMENT/PLAN:   Assessment & Plan Type 2 diabetes mellitus with chronic kidney disease on chronic dialysis, with long-term current use of insulin (HCC) A1c 6.2, decreased from 6.7 in 01/2023.  Currently only taking Lantus 5 units daily and appears to have some symptomatic hypoglycemic episodes.  -Stop taking Lantus, recommend checking fasting sugar 2-3 times per week and  if consistently over 200 to return to care Anemia, unspecified type Hemoglobin 10.1 in 09/2023, decreased from baseline hemoglobin 11-12.  No signs of bleeding. -CBC Chest pressure 1 episode of chest pressure and left arm pain about 2 weeks ago concerning for cardiac etiology.  Given ESRD on dialysis patient, high risk for CV events.  Requesting cardiology referral for further evaluation.  Taking atorvastatin 40 mg daily but will repeat lipid panel today to optimize cholesterol management. -Referral to Cardiology Healthcare maintenance -Rx for Shingrix printed and given to patient     Dr. Elberta Fortis, DO [facility]

## 2023-11-22 ENCOUNTER — Encounter: Payer: Self-pay | Admitting: Family Medicine

## 2023-11-22 ENCOUNTER — Ambulatory Visit: Payer: 59 | Admitting: Family Medicine

## 2023-11-22 VITALS — BP 138/76 | HR 97 | Wt 185.0 lb

## 2023-11-22 DIAGNOSIS — E1122 Type 2 diabetes mellitus with diabetic chronic kidney disease: Secondary | ICD-10-CM

## 2023-11-22 DIAGNOSIS — Z0000 Encounter for general adult medical examination without abnormal findings: Secondary | ICD-10-CM | POA: Diagnosis not present

## 2023-11-22 DIAGNOSIS — D649 Anemia, unspecified: Secondary | ICD-10-CM | POA: Diagnosis not present

## 2023-11-22 DIAGNOSIS — Z7984 Long term (current) use of oral hypoglycemic drugs: Secondary | ICD-10-CM

## 2023-11-22 DIAGNOSIS — Z992 Dependence on renal dialysis: Secondary | ICD-10-CM | POA: Diagnosis not present

## 2023-11-22 DIAGNOSIS — R0789 Other chest pain: Secondary | ICD-10-CM | POA: Diagnosis not present

## 2023-11-22 DIAGNOSIS — N186 End stage renal disease: Secondary | ICD-10-CM | POA: Diagnosis not present

## 2023-11-22 DIAGNOSIS — Z794 Long term (current) use of insulin: Secondary | ICD-10-CM

## 2023-11-22 LAB — POCT GLYCOSYLATED HEMOGLOBIN (HGB A1C): HbA1c, POC (controlled diabetic range): 6.2 % (ref 0.0–7.0)

## 2023-11-22 MED ORDER — SHINGRIX 50 MCG/0.5ML IM SUSR: INTRAMUSCULAR | 1 refills | Status: AC

## 2023-11-22 NOTE — Assessment & Plan Note (Signed)
A1c 6.2, decreased from 6.7 in 01/2023.  Currently only taking Lantus 5 units daily and appears to have some symptomatic hypoglycemic episodes.  -Stop taking Lantus, recommend checking fasting sugar 2-3 times per week and if consistently over 200 to return to care

## 2023-11-22 NOTE — Patient Instructions (Addendum)
It was wonderful to see you today! Thank you for choosing [facility].   Please bring ALL of your medications with you to every visit.   Today we talked about:  I do think your hand pain is likely coming from arthritis.  I know you are limited in your pain options but you can use Tylenol if it becomes severe and comfortable.  As I mentioned you can try the topical Arnica gel for pain relief that you can pick up at the pharmacy. Please stop taking your Lantus as I do not think you need insulin at this time.  Please continue to check your fasting blood sugar 2-3 times per week.  If it creeps above 200 please schedule an appointment to let me know that we can discuss other diabetes management options. For episode of chest pressure and left arm pain I am referring you to the cardiologist.  Please continue to take your atorvastatin and based on your lipid panel today and may consider adjustments to your medication.  Our office will call you with this information.  Please follow up in 3 months   If you haven't already, sign up for My Chart to have easy access to your labs results, and communication with your primary care physician.   We are checking some labs today. If they are abnormal, I will call you. If they are normal, I will send you a MyChart message (if it is active) or a letter in the mail. If you do not hear about your labs in the next 2 weeks, please call the office.  Call the clinic at [phone] if your symptoms worsen or you have any concerns.  Please be sure to schedule follow up at the front desk before you leave today.   Elberta Fortis, DO Family Medicine

## 2023-11-22 NOTE — Assessment & Plan Note (Signed)
-  Rx for Shingrix printed and given to patient

## 2023-11-23 LAB — LIPID PANEL
Chol/HDL Ratio: 2.1 {ratio} (ref 0.0–4.4)
Cholesterol, Total: 106 mg/dL (ref 100–199)
HDL: 51 mg/dL (ref >39)
LDL Chol Calc (NIH): 41 mg/dL (ref 0–99)
Triglycerides: 64 mg/dL (ref 0–149)
VLDL Cholesterol Cal: 14 mg/dL (ref 5–40)

## 2023-11-23 LAB — CBC
Hematocrit: 38.9 % (ref 34.0–46.6)
Hemoglobin: 11.5 g/dL (ref 11.1–15.9)
MCH: 23.4 pg — ABNORMAL LOW (ref 26.6–33.0)
MCHC: 29.6 g/dL — ABNORMAL LOW (ref 31.5–35.7)
MCV: 79 fL (ref 79–97)
Platelets: 136 10*3/uL — ABNORMAL LOW (ref 150–450)
RBC: 4.91 x10E6/uL (ref 3.77–5.28)
RDW: 15.3 % (ref 11.7–15.4)
WBC: 6 10*3/uL (ref 3.4–10.8)

## 2023-11-25 ENCOUNTER — Other Ambulatory Visit: Payer: Self-pay | Admitting: Family Medicine

## 2023-11-25 ENCOUNTER — Telehealth: Payer: Self-pay | Admitting: Family Medicine

## 2023-11-25 DIAGNOSIS — E1142 Type 2 diabetes mellitus with diabetic polyneuropathy: Secondary | ICD-10-CM

## 2023-11-25 NOTE — Telephone Encounter (Signed)
Attempted to contact patient regarding lab work, noted thrombocytopenia not present previously.  Possibly reactive, would recommend repeating blood work before pursuing workup.  Recommend repeat CBC in 12/2023 to see if persistently thrombocytopenic.  Elberta Fortis, DO

## 2024-01-02 ENCOUNTER — Encounter (HOSPITAL_COMMUNITY): Payer: Self-pay

## 2024-01-10 ENCOUNTER — Encounter (HOSPITAL_COMMUNITY): Payer: Self-pay

## 2024-01-16 ENCOUNTER — Other Ambulatory Visit: Payer: Self-pay | Admitting: Family Medicine

## 2024-01-16 DIAGNOSIS — I159 Secondary hypertension, unspecified: Secondary | ICD-10-CM

## 2024-01-16 NOTE — Progress Notes (Signed)
    SUBJECTIVE:   CHIEF COMPLAINT / HPI:    Diabetic neuropathy Has had worsening pain in her bilateral lower extremities (left foot and right BKA) Currently taking gabapentin 100 mg 3 times daily Endorses some numbness and tingling in left foot  T2DM -Current medication regimen: Was previously on Lantus 5 units daily but this was stopped 11/22/2023 due to symptomatic hypoglycemia -Home CBGs: 125-140 fasting  -Foot exam: R foot s/p amputation; L foot today -Eye exam: scheduled 2/18 per patient  Lab Results  Component Value Date   HGBA1C 6.2 11/22/2023   HGBA1C 6.7 01/06/2023   HGBA1C 8.1 (A) 06/24/2022   Patient was also noted to have thrombocytopenia (136) on CBC thought to be reactive  HTN - ESRD follows w/ nephro Reports she's out of Azor - I just sent a refill of this 2/10, advised to pick this up Also takes hydralazine and Coreg - took these today    PERTINENT  PMH / PSH: ESRD on dialysis MWF, diabetes, peripheral neuropathy, s/p right BKA  OBJECTIVE:   BP (!) 179/75   Pulse 78   Ht 5\' 7"  (1.702 m)   Wt 181 lb 6 oz (82.3 kg)   LMP 03/07/2019   SpO2 100%   BMI 28.41 kg/m   General: NAD, pleasant, in wheelchair Respiratory: No respiratory distress Ext: S/p right foot amputation  Left foot examined, slightly decreased sensation to sharp and dull stimuli across dorsal and plantar aspect of foot.  Proprioception decreased as well.  No wounds or ulcers noted.  ASSESSMENT/PLAN:   Assessment & Plan Diabetic peripheral neuropathy (HCC) Increase gabapentin to 200mg  TID - f/u 39mo, can increase dose further as appropriate. Discussed return precautions with increased dose Type 2 diabetes mellitus with chronic kidney disease on chronic dialysis, with long-term current use of insulin (HCC) Stable, home CBGs in reasonable range.  Had symptomatic hypoglycemia on insulin.  Feel she is okay to hold off on restarting insulin at this time.  Continue to follow over time.  A1c  due in 1 month.  Foot exam done today Thrombocytopenia (HCC) Noted on recent CBC, recheck was advised - CBC w/diff today Secondary hypertension, unspecified Follows w/ nephrology Elevated BP today due to being out of Azor - sent refill 2/10 , advised pt to pick up Asymptomatic    Vonna Drafts, MD [facility]

## 2024-01-17 ENCOUNTER — Encounter: Payer: Self-pay | Admitting: Family Medicine

## 2024-01-17 ENCOUNTER — Ambulatory Visit (INDEPENDENT_AMBULATORY_CARE_PROVIDER_SITE_OTHER): Payer: 59 | Admitting: Family Medicine

## 2024-01-17 VITALS — BP 179/75 | HR 78 | Ht 67.0 in | Wt 181.4 lb

## 2024-01-17 DIAGNOSIS — D696 Thrombocytopenia, unspecified: Secondary | ICD-10-CM | POA: Diagnosis not present

## 2024-01-17 DIAGNOSIS — Z794 Long term (current) use of insulin: Secondary | ICD-10-CM

## 2024-01-17 DIAGNOSIS — E1142 Type 2 diabetes mellitus with diabetic polyneuropathy: Secondary | ICD-10-CM | POA: Diagnosis not present

## 2024-01-17 DIAGNOSIS — Z992 Dependence on renal dialysis: Secondary | ICD-10-CM

## 2024-01-17 DIAGNOSIS — E785 Hyperlipidemia, unspecified: Secondary | ICD-10-CM

## 2024-01-17 DIAGNOSIS — I159 Secondary hypertension, unspecified: Secondary | ICD-10-CM

## 2024-01-17 DIAGNOSIS — N186 End stage renal disease: Secondary | ICD-10-CM

## 2024-01-17 DIAGNOSIS — E1122 Type 2 diabetes mellitus with diabetic chronic kidney disease: Secondary | ICD-10-CM | POA: Diagnosis not present

## 2024-01-17 MED ORDER — GABAPENTIN 100 MG PO CAPS
200.0000 mg | ORAL_CAPSULE | Freq: Three times a day (TID) | ORAL | 0 refills | Status: DC
Start: 2024-01-17 — End: 2024-03-02

## 2024-01-17 MED ORDER — ATORVASTATIN CALCIUM 40 MG PO TABS
40.0000 mg | ORAL_TABLET | Freq: Every day | ORAL | 0 refills | Status: DC
Start: 2024-01-17 — End: 2024-08-15

## 2024-01-17 NOTE — Assessment & Plan Note (Signed)
Increase gabapentin to 200mg  TID - f/u 40mo, can increase dose further as appropriate. Discussed return precautions with increased dose

## 2024-01-17 NOTE — Assessment & Plan Note (Signed)
Follows w/ nephrology Elevated BP today due to being out of Azor - sent refill 2/10 , advised pt to pick up Asymptomatic

## 2024-01-17 NOTE — Patient Instructions (Addendum)
Please take gabapentin 200 mg 3 times daily  I have sent a refill of lipitor  Refill of your Azor is at the pharmacy  Please let me know if you have any dizziness, lightheadedness, or passing out with this  I will let you know if any results from today are abnormal

## 2024-01-17 NOTE — Assessment & Plan Note (Addendum)
Stable, home CBGs in reasonable range.  Had symptomatic hypoglycemia on insulin.  Feel she is okay to hold off on restarting insulin at this time.  Continue to follow over time.  A1c due in 1 month.  Foot exam done today

## 2024-01-18 ENCOUNTER — Encounter: Payer: Self-pay | Admitting: Family Medicine

## 2024-01-18 LAB — CBC WITH DIFFERENTIAL/PLATELET
Basophils Absolute: 0.1 10*3/uL (ref 0.0–0.2)
Basos: 1 %
EOS (ABSOLUTE): 0.2 10*3/uL (ref 0.0–0.4)
Eos: 4 %
Hematocrit: 36.1 % (ref 34.0–46.6)
Hemoglobin: 10.7 g/dL — ABNORMAL LOW (ref 11.1–15.9)
Immature Grans (Abs): 0 10*3/uL (ref 0.0–0.1)
Immature Granulocytes: 0 %
Lymphocytes Absolute: 1.8 10*3/uL (ref 0.7–3.1)
Lymphs: 33 %
MCH: 22.6 pg — ABNORMAL LOW (ref 26.6–33.0)
MCHC: 29.6 g/dL — ABNORMAL LOW (ref 31.5–35.7)
MCV: 76 fL — ABNORMAL LOW (ref 79–97)
Monocytes Absolute: 0.6 10*3/uL (ref 0.1–0.9)
Monocytes: 11 %
Neutrophils Absolute: 2.7 10*3/uL (ref 1.4–7.0)
Neutrophils: 51 %
Platelets: 157 10*3/uL (ref 150–450)
RBC: 4.73 x10E6/uL (ref 3.77–5.28)
RDW: 16.7 % — ABNORMAL HIGH (ref 11.7–15.4)
WBC: 5.4 10*3/uL (ref 3.4–10.8)

## 2024-01-19 ENCOUNTER — Encounter: Payer: Self-pay | Admitting: Family Medicine

## 2024-02-09 ENCOUNTER — Encounter (HOSPITAL_COMMUNITY): Payer: Self-pay | Admitting: Nephrology

## 2024-02-09 ENCOUNTER — Encounter (HOSPITAL_COMMUNITY)
Admission: RE | Disposition: A | Discharge status: Home / Self Care | Payer: Self-pay | Source: Home / Self Care | Attending: Nephrology

## 2024-02-09 ENCOUNTER — Other Ambulatory Visit: Payer: Self-pay

## 2024-02-09 ENCOUNTER — Ambulatory Visit (HOSPITAL_COMMUNITY)
Admission: RE | Admit: 2024-02-09 | Discharge: 2024-02-09 | Disposition: A | Discharge status: Home / Self Care | Payer: 59 | Attending: Nephrology | Admitting: Nephrology

## 2024-02-09 DIAGNOSIS — T82510A Breakdown (mechanical) of surgically created arteriovenous fistula, initial encounter: Secondary | ICD-10-CM | POA: Diagnosis not present

## 2024-02-09 DIAGNOSIS — Z992 Dependence on renal dialysis: Secondary | ICD-10-CM | POA: Diagnosis not present

## 2024-02-09 DIAGNOSIS — I120 Hypertensive chronic kidney disease with stage 5 chronic kidney disease or end stage renal disease: Secondary | ICD-10-CM | POA: Insufficient documentation

## 2024-02-09 DIAGNOSIS — E1122 Type 2 diabetes mellitus with diabetic chronic kidney disease: Secondary | ICD-10-CM | POA: Insufficient documentation

## 2024-02-09 DIAGNOSIS — Y832 Surgical operation with anastomosis, bypass or graft as the cause of abnormal reaction of the patient, or of later complication, without mention of misadventure at the time of the procedure: Secondary | ICD-10-CM | POA: Diagnosis not present

## 2024-02-09 DIAGNOSIS — N186 End stage renal disease: Secondary | ICD-10-CM | POA: Diagnosis not present

## 2024-02-09 DIAGNOSIS — I728 Aneurysm of other specified arteries: Secondary | ICD-10-CM | POA: Diagnosis not present

## 2024-02-09 DIAGNOSIS — N250 Renal osteodystrophy: Secondary | ICD-10-CM | POA: Diagnosis not present

## 2024-02-09 DIAGNOSIS — D631 Anemia in chronic kidney disease: Secondary | ICD-10-CM | POA: Insufficient documentation

## 2024-02-09 DIAGNOSIS — T82858A Stenosis of vascular prosthetic devices, implants and grafts, initial encounter: Secondary | ICD-10-CM | POA: Diagnosis present

## 2024-02-09 HISTORY — PX: A/V FISTULAGRAM: CATH118298

## 2024-02-09 SURGERY — A/V FISTULAGRAM
Anesthesia: LOCAL

## 2024-02-09 MED ORDER — IODIXANOL 320 MG/ML IV SOLN
INTRAVENOUS | Status: DC | PRN
  Administered 2024-02-09: 20 mL via INTRAVENOUS

## 2024-02-09 MED ORDER — FENTANYL CITRATE (PF) 100 MCG/2ML IJ SOLN
INTRAMUSCULAR | Status: DC | PRN
  Administered 2024-02-09: 25 ug via INTRAVENOUS

## 2024-02-09 MED ORDER — LIDOCAINE HCL (PF) 1 % IJ SOLN
INTRAMUSCULAR | Status: AC
  Filled 2024-02-09: qty 30

## 2024-02-09 MED ORDER — MIDAZOLAM HCL 2 MG/2ML IJ SOLN
INTRAMUSCULAR | Status: AC
  Filled 2024-02-09: qty 2

## 2024-02-09 MED ORDER — FENTANYL CITRATE (PF) 100 MCG/2ML IJ SOLN
INTRAMUSCULAR | Status: AC
  Filled 2024-02-09: qty 2

## 2024-02-09 MED ORDER — MIDAZOLAM HCL 2 MG/2ML IJ SOLN
INTRAMUSCULAR | Status: DC | PRN
  Administered 2024-02-09: 1 mg via INTRAVENOUS

## 2024-02-09 MED ORDER — HEPARIN (PORCINE) IN NACL 1000-0.9 UT/500ML-% IV SOLN
INTRAVENOUS | Status: DC | PRN
  Administered 2024-02-09: 500 mL

## 2024-02-09 MED ORDER — LIDOCAINE HCL (PF) 1 % IJ SOLN
INTRAMUSCULAR | Status: DC | PRN
  Administered 2024-02-09: 2 mL via SUBCUTANEOUS

## 2024-02-09 SURGICAL SUPPLY — 3 items
BAG SNAP BAND KOVER 36X36 (MISCELLANEOUS) ×2 IMPLANT
COVER DOME SNAP 22 D (MISCELLANEOUS) ×2 IMPLANT
TRAY PV CATH (CUSTOM PROCEDURE TRAY) ×2 IMPLANT

## 2024-02-09 NOTE — H&P (Addendum)
 Chief Complaint: Enlarging aneurysm  Interval H&P  The patient has presented today for an angiogram/ angioplasty.  Various methods of treatment have been discussed with the patient.  After consideration of risk, benefits and other options for treatment, the patient has consented to a angiogram/ angioplasty with  possible stent placement.   Risks of angiogram with potential angioplasty and stenting if needed.contrast reaction, extravasation/ bleeding, dissection, hypotension and death were explained to the patient.  The patient's history has been reviewed and the patient has been examined, no changes in status.  Stable for angiogram/angioplasty  I have reviewed the patient's chart and labs.  Questions were answered to the patient's satisfaction.  Assessment/Plan: ESRD dialyzing at MWF regimen with last dialysis Wednesday Decreased access flows and a left upper arm brachiobasilic fistula which was transposed by Dr. Darrick Penna July 22, 2015.- planning on angiogram with possibly angioplasty. Renal osteodystrophy - continue binders per home regimen. Anemia - managed with ESA's and IV iron at dialysis center. HTN - resume home regimen.   HPI: [name] is an 58 y.o. female with a history of diabetes, enterococcal bacteremia, hypertension, legally blind in the left eye, liver abscess, type 2 diabetes, ESRD being dialyzed Monday Wednesday and Fridays.  Patient is being referred for decreasing access flows and a left upper arm brachiobasilic fistula transposed by Dr. Darrick Penna July 22, 2015.  ROS Per HPI.  Chemistry and CBC: Creatinine  Date/Time Value Ref Range Status  09/12/2013 07:36 AM 1.55 (H) 0.60 - 1.30 mg/dL Final  86/57/8469 62:95 PM 1.36 (H) 0.60 - 1.30 mg/dL Final   Creat  Date/Time Value Ref Range Status  09/12/2012 11:13 AM 0.94 0.50 - 1.10 mg/dL Final  28/41/3244 01:02 AM 0.91 0.50 - 1.10 mg/dL Final  72/53/6644 03:47 PM 1.83 (H) 0.50 - 1.10 mg/dL Final    Comment:      Please note change in reference range(s).      Creatinine, Ser  Date/Time Value Ref Range Status  10/02/2023 06:30 PM 9.24 (H) 0.44 - 1.00 mg/dL Final  42/59/5638 75:64 AM 7.00 (H) 0.44 - 1.00 mg/dL Final  33/29/5188 41:66 AM 7.10 (H) 0.44 - 1.00 mg/dL Final  06/04/1600 09:32 AM 7.59 (H) 0.57 - 1.00 mg/dL Final  35/57/3220 25:42 AM 9.05 (H) 0.57 - 1.00 mg/dL Final  70/62/3762 83:15 PM 6.48 (H) 0.57 - 1.00 mg/dL Final  17/61/6073 71:06 AM 5.82 (H) 0.44 - 1.00 mg/dL Final    Comment:    DELTA CHECK NOTED DIALYSIS   12/25/2018 03:32 AM 9.67 (H) 0.44 - 1.00 mg/dL Final  26/94/8546 27:03 AM 7.84 (H) 0.44 - 1.00 mg/dL Final  50/08/3817 29:93 AM 9.44 (H) 0.44 - 1.00 mg/dL Final  71/69/6789 38:10 AM 7.76 (H) 0.44 - 1.00 mg/dL Final  17/51/0258 52:77 AM 9.19 (H) 0.44 - 1.00 mg/dL Final  82/42/3536 14:43 PM 8.23 (H) 0.44 - 1.00 mg/dL Final  15/40/0867 61:95 AM 7.13 (H) 0.44 - 1.00 mg/dL Final  09/32/6712 45:80 AM 9.41 (H) 0.44 - 1.00 mg/dL Final  99/83/3825 05:39 AM 7.18 (H) 0.44 - 1.00 mg/dL Final  76/73/4193 79:02 PM 6.31 (H) 0.44 - 1.00 mg/dL Final  40/97/3532 99:24 AM 10.94 (H) 0.44 - 1.00 mg/dL Final  26/83/4196 22:29 AM 7.79 (H) 0.44 - 1.00 mg/dL Final  79/89/2119 41:74 AM 6.90 (H) 0.44 - 1.00 mg/dL Final  07/19/4817 56:31 AM 6.64 (H) 0.44 - 1.00 mg/dL Final  49/70/2637 85:88 PM 5.55 (H) 0.44 - 1.00 mg/dL Final  50/27/7412 87:86 PM 5.40 (H) 0.44 -  1.00 mg/dL Final  57/84/6962 95:28 PM 5.54 (H) 0.44 - 1.00 mg/dL Final  41/32/4401 02:72 PM 5.36 (H) 0.44 - 1.00 mg/dL Final  53/66/4403 47:42 AM 4.23 (H) 0.44 - 1.00 mg/dL Final  59/56/3875 64:33 AM 4.03 (H) 0.44 - 1.00 mg/dL Final  29/51/8841 66:06 AM 4.17 (H) 0.44 - 1.00 mg/dL Final  30/16/0109 32:35 PM 4.65 (H) 0.44 - 1.00 mg/dL Final  57/32/2025 42:70 AM 1.12 (H) 0.50 - 1.10 mg/dL Final  62/37/6283 15:17 AM 0.83 0.50 - 1.10 mg/dL Final    Comment:    DELTA CHECK NOTED  08/27/2012 05:40 AM 1.28 (H) 0.50 - 1.10 mg/dL Final   61/60/7371 06:26 AM 0.96 0.50 - 1.10 mg/dL Final  94/85/4627 03:50 AM 0.83 0.50 - 1.10 mg/dL Final  09/38/1829 93:71 AM 0.90 0.50 - 1.10 mg/dL Final  69/67/8938 10:17 AM 0.89 0.50 - 1.10 mg/dL Final    Comment:    DELTA CHECK NOTED  08/22/2012 06:03 AM 1.43 (H) 0.50 - 1.10 mg/dL Final  51/01/5851 77:82 AM 1.56 (H) 0.50 - 1.10 mg/dL Final  42/35/3614 43:15 PM 1.24 (H) 0.50 - 1.10 mg/dL Final  40/07/6760 95:09 PM 0.71 0.50 - 1.10 mg/dL Final  32/67/1245 80:99 AM 1.00 0.50 - 1.10 mg/dL Final  83/38/2505 39:76 AM 0.86 0.50 - 1.10 mg/dL Final  73/41/9379 02:40 AM 0.86 0.50 - 1.10 mg/dL Final  97/35/3299 24:26 AM 0.84 0.50 - 1.10 mg/dL Final  83/41/9622 29:79 AM 0.87 0.50 - 1.10 mg/dL Final  89/21/1941 74:08 AM 0.96 0.50 - 1.10 mg/dL Final  14/48/1856 31:49 AM 1.14 (H) 0.50 - 1.10 mg/dL Final  70/26/3785 88:50 PM 0.87 0.50 - 1.10 mg/dL Final  27/74/1287 86:76 PM 0.73 0.50 - 1.10 mg/dL Final  72/08/4708 62:83 AM 1.09 0.50 - 1.10 mg/dL Final  66/29/4765 46:50 AM 1.19 (H) 0.50 - 1.10 mg/dL Final  35/46/5681 27:51 AM 1.65 (H) 0.50 - 1.10 mg/dL Final    Comment:    DELTA CHECK NOTED   No results for input(s): "NA", "K", "CL", "CO2", "GLUCOSE", "BUN", "CREATININE", "CALCIUM", "PHOS" in the last 168 hours.  Invalid input(s): "ALB" No results for input(s): "WBC", "NEUTROABS", "HGB", "HCT", "MCV", "PLT" in the last 168 hours. Liver Function Tests: No results for input(s): "AST", "ALT", "ALKPHOS", "BILITOT", "PROT", "ALBUMIN" in the last 168 hours. No results for input(s): "LIPASE", "AMYLASE" in the last 168 hours. No results for input(s): "AMMONIA" in the last 168 hours. Cardiac Enzymes: No results for input(s): "CKTOTAL", "CKMB", "CKMBINDEX", "TROPONINI" in the last 168 hours. Iron Studies: No results for input(s): "IRON", "TIBC", "TRANSFERRIN", "FERRITIN" in the last 72 hours. PT/INR: @LABRCNTIP (inr:5)  Xrays/Other Studies: )No results found for this or any previous visit (from the past  48 hours). No results found.  PMH:   Past Medical History:  Diagnosis Date   Alcohol dependency (HCC)    Anemia    Appendicitis 12/06/2018   Chronic kidney disease    Diabetes mellitus    type 2   Diabetes mellitus type 2, uncontrolled    End stage renal disease (HCC) 05/05/2016   Starting 04/2016, LUE fistula   Enterococcal bacteremia    Hypertension    Legally blind in left eye, as defined in [place]    Liver abscess 12/20/2018   Necrotizing fasciitis (HCC)    PONV (postoperative nausea and vomiting)    Sepsis without acute organ dysfunction (HCC)    Type 2 diabetes mellitus with diabetic chronic kidney disease (HCC) 05/05/2016    PSH:   Past Surgical  History:  Procedure Laterality Date   AMPUTATION     right first and left middle finger amputation 2/2 OM 08/2011 by Dr. Amanda Pea   AMPUTATION  01/18/2012   Procedure: AMPUTATION DIGIT;  Surgeon: Tami Ribas, MD;  Location: [facility];  Service: Orthopedics;  Laterality: Left;  revision amputation left thumb   AMPUTATION  08/25/2012   Procedure: AMPUTATION BELOW KNEE;  Surgeon: Nadara Mustard, MD;  Location: [facility] OR;  Service: Orthopedics;  Laterality: Right;  Right Below Knee Amputation   APPENDECTOMY     BASCILIC VEIN TRANSPOSITION Left 07/22/2015   Procedure: BASILIC VEIN TRANSPOSITION ;  Surgeon: Sherren Kerns, MD;  Location: [facility];  Service: Vascular;  Laterality: Left;   CATARACT EXTRACTION     right    CESAREAN SECTION  1986   CESAREAN SECTION  1985   CESAREAN SECTION  1991   COLONOSCOPY WITH PROPOFOL N/A 08/25/2023   Procedure: COLONOSCOPY WITH PROPOFOL;  Surgeon: Meryl Dare, MD;  Location: [facility];  Service: Gastroenterology;  Laterality: N/A;   I & D EXTREMITY  12/24/2011   Procedure: IRRIGATION AND DEBRIDEMENT EXTREMITY;  Surgeon: Tami Ribas, MD;  Location: [facility];  Service: Orthopedics;  Laterality: Left;  I&Dleft thumb with partial amputation   I & D EXTREMITY  12/26/2011   Procedure: IRRIGATION  AND DEBRIDEMENT EXTREMITY;  Surgeon: Tami Ribas, MD;  Location: [facility];  Service: Orthopedics;  Laterality: Left;   I & D EXTREMITY  12/23/2011   Procedure: IRRIGATION AND DEBRIDEMENT EXTREMITY;  Surgeon: Tami Ribas, MD;  Location: [facility] OR;  Service: Orthopedics;  Laterality: Left;  I&D Left thumb, hand and arm   I&D left thumb  12/23/2011   IR RADIOLOGIST EVAL & MGMT  01/04/2019   IR RADIOLOGIST EVAL & MGMT  01/25/2019   LAPAROSCOPIC APPENDECTOMY N/A 12/06/2018   Procedure: APPENDECTOMY LAPAROSCOPIC;  Surgeon: Sheliah Hatch De Blanch, MD;  Location: [facility];  Service: General;  Laterality: N/A;   POLYPECTOMY  08/25/2023   Procedure: POLYPECTOMY;  Surgeon: Meryl Dare, MD;  Location: [facility];  Service: Gastroenterology;;   TUBAL LIGATION      Allergies:  Allergies  Allergen Reactions   Shrimp Extract Nausea And Vomiting, Swelling and Other (See Comments)    Patient ended up in the ED after eating this- left eye is puffy    Medications:   Prior to Admission medications   Medication Sig Start Date End Date Taking? Authorizing Provider  atorvastatin (LIPITOR) 40 MG tablet Take 1 tablet (40 mg total) by mouth daily. Patient taking differently: Take 40 mg by mouth at bedtime. 01/17/24  Yes Vonna Drafts, MD  amlodipine-olmesartan (AZOR) 10-20 MG tablet TAKE 1 TABLET BY MOUTH DAILY. 01/16/24   Vonna Drafts, MD  B Complex-C-Folic Acid (DIALYVITE 800) 0.8 MG TABS Take 1 tablet by mouth daily. 10/09/18   [provider]  blood glucose meter kit and supplies KIT Dispense based on patient and insurance preference. Use up to four times daily as directed. 08/12/22   Vonna Drafts, MD  blood glucose meter kit and supplies Dispense based on patient and insurance preference. Use up to four times daily as directed. (FOR ICD-10 E10.9, E11.9). 11/21/20   Shirlean Mylar, MD  Blood Pressure Monitoring (ADULT BLOOD PRESSURE CUFF LG) KIT 1 each by Does not apply route daily. 04/27/22   Shirlean Mylar, MD  carvedilol (COREG) 6.25 MG tablet Take 6.25 mg by mouth 2 (two) times daily with a  meal.    [provider]  EPINEPHrine 0.3 mg/0.3 mL IJ SOAJ injection Inject 0.3 mg into the muscle as needed for anaphylaxis. 10/02/23   Loetta Rough, MD  ferric citrate (AURYXIA) 1 GM 210 MG(Fe) tablet  05/22/19   [provider]  gabapentin (NEURONTIN) 100 MG capsule Take 2 capsules (200 mg total) by mouth 3 (three) times daily. 01/17/24   Vonna Drafts, MD  glucose blood (ACCU-CHEK GUIDE) test strip User to check blood sugar up to 4x per day e11.9 10/07/22   Vonna Drafts, MD  hydrALAZINE (APRESOLINE) 10 MG tablet TAKE 1 TABLET (10 MG TOTAL) BY MOUTH 3 (THREE) TIMES DAILY. 11/25/23   Vonna Drafts, MD  Insulin Pen Needle (EASY TOUCH PEN NEEDLES) 32G X 4 MM MISC 1 each by Does not apply route 3 (three) times daily. 08/10/18   Caro Laroche, DO  Lancet Devices [facility] DELICA PLUS LANCING) MISC CHECK BLOOD SUGARS FOURX DAILY 12/03/20   Shirlean Mylar, MD  Lancets [facility] DELICA PLUS [place]) MISC USE TO TEST BLOOD SUGAR AS DIRECTED 12/11/21   Shirlean Mylar, MD  LANTUS SOLOSTAR 100 UNIT/ML Solostar Pen Inject 10 Units into the skin daily. 01/20/23   Vonna Drafts, MD  Na Sulfate-K Sulfate-Mg Sulf (SUPREP BOWEL PREP KIT) 17.5-3.13-1.6 GM/177ML SOLN Take 1 kit by mouth as directed. Patient not taking: Reported on 10/02/2023 07/05/23   Meredith Pel, NP  omeprazole (PRILOSEC) 40 MG capsule TAKE 1 CAPSULE (40 MG TOTAL) BY MOUTH DAILY. 11/25/23   Vonna Drafts, MD  sevelamer carbonate (RENVELA) 800 MG tablet Take 800-1,600 mg by mouth 3 (three) times daily with meals. Take 1,600 mg by mouth three times a day with meals and 800 mg with snacks    [provider]  TYLENOL 500 MG tablet Take 500-1,000 mg by mouth every 6 (six) hours as needed for mild pain (pain score 1-3) or headache.    [provider]  Zoster Vaccine Adjuvanted [facility]) injection Administer  Shingrix vaccination now and repeat in two months 11/22/23   Elberta Fortis, MD    Discontinued Meds:  There are no discontinued medications.  Social History:  reports that she has never smoked. She has never been exposed to tobacco smoke. She has never used smokeless tobacco. She reports current alcohol use. She reports current drug use. Drug: Cocaine.  Family History:   Family History  Problem Relation Age of Onset   Diabetes Mother    Alcohol abuse Mother    Diabetes Father    Alzheimer's disease Father    Hypertension Father    Kidney failure Sister    Diabetes Sister    High blood pressure Sister     Last menstrual period 03/07/2019. GEN: NAD, A&Ox3, NCAT HEENT: No conjunctival pallor, EOMI NECK: Supple, no thyromegaly LUNGS: CTA B/L no rales, rhonchi or wheezing CV: RRR, No M/R/G ABD: SNDNT +BS  EXT: No lower extremity edema ACCESS:: Left upper arm brachiobasilic fistula with 2 large aneurysms with a nice thrill along the inflow       Ethelene Hal, MD 02/09/2024, 8:19 AM

## 2024-02-09 NOTE — Op Note (Signed)
 Patient presents for concerns of an enlarging aneurysm in her left BBT which was transposed on July 22, 2015 by Dr. Darrick Penna.    On the physical exam, there is a very large aneurysm in the cannulation zone but very nice thrill along the inflow.  Summary:  1)      The patient had more angiogram with a very large aneurysm in the arterial limb of the fistula in the cannulation zone.   2)      Body, outflow basilic, axillary, centrals and inflow anastomosis were well-visualized and widely patent with good flows.   3)      This left BBT remains amenable to future percutaneous intervention; will refer to [facility] to evaluate the enlarging aneurysm.  Description of procedure: The arm was prepped and draped in the usual sterile fashion. The left upper arm brachial basilic fistula was cannulated (40102) with an 18G Angiocath needle directed in a antegrade direction just above the juxta segment in the fistula. Contrast [phone]) injection via the end of the Angiocath was performed. The angiogram of the fistula (64403) showed a very large aneurysm in the arterial limb of the basilic vein, widely patent body of the basilic including the outflow basilic swing site; the axillary vein, centrals, and inflow anastomosis were widely patent with great flows.    Hemostasis: Manual point pressure was placed at the cannulation site on removal of the angiocath .  Sedation: 1 mg Versed, 25 mcg Fentanyl. Sedation time. 5 minutes  Contrast. 20 mL  Monitoring: Because of the patient's comorbid conditions and sedation during the procedure, continuous EKG monitoring and O2 saturation monitoring was performed throughout the procedure by the RN. There were no abnormal arrhythmias encountered.  Complications: None  Diagnoses: I87.1 Stricture of vein  N18.6 ESRD T82.858A Stricture of access  Procedure Coding:  36901 Cannulation and angiogram of fistula Q9967 Contrast  Recommendations:  1. Continue to cannulate the fistula  with 15G needles.  2. Refer for problems with flows/swelling. 3. Remove the suture next treatment.   Discharge: The patient was discharged home in stable condition. The patient was given education regarding the care of the dialysis access AVF and specific instructions in case of any problems.

## 2024-02-09 NOTE — Discharge Instructions (Signed)

## 2024-03-02 ENCOUNTER — Other Ambulatory Visit: Payer: Self-pay

## 2024-03-02 DIAGNOSIS — E1142 Type 2 diabetes mellitus with diabetic polyneuropathy: Secondary | ICD-10-CM

## 2024-03-04 MED ORDER — GABAPENTIN 100 MG PO CAPS
200.0000 mg | ORAL_CAPSULE | Freq: Three times a day (TID) | ORAL | 0 refills | Status: DC
Start: 2024-03-04 — End: 2024-10-08

## 2024-03-04 MED ORDER — HYDRALAZINE HCL 10 MG PO TABS: 10.0000 mg | ORAL_TABLET | Freq: Three times a day (TID) | ORAL | 0 refills | Status: AC

## 2024-03-08 ENCOUNTER — Ambulatory Visit: Admitting: Physician Assistant

## 2024-03-08 VITALS — BP 152/79 | HR 75 | Temp 97.5°F | Ht 66.0 in | Wt 181.0 lb

## 2024-03-08 DIAGNOSIS — T82898A Other specified complication of vascular prosthetic devices, implants and grafts, initial encounter: Secondary | ICD-10-CM

## 2024-03-09 NOTE — Progress Notes (Signed)
 Office Note   History of Present Illness   [name] is a 58 y.o. (05-06-1966) female who presents for evaluation of an aneurysmal fistula.  She has a history of left brachiobasilic fistula transposition on 07/22/2015 by Dr. Darrick Penna.  She returns today for evaluation of an aneurysmal area of her fistula.  She states that since her fistula was created, 1 area has become larger in size.  This does not cause her any discomfort or pain.  She has no symptoms of steal in the left hand such as weakness, numbness, or pain.  She recently underwent a fistulogram on 3/6 which demonstrated a widely patent fistula, inflow, and outflow.  She denies any recent infiltration issues or prolonged bleeding.  She says that dialysis has no issue cannulating her fistula, but they just avoid sticking her right on top of the aneurysm.  She dialyzes on MWF at [facility].  Current Outpatient Medications  Medication Sig Dispense Refill   amlodipine-olmesartan (AZOR) 10-20 MG tablet TAKE 1 TABLET BY MOUTH DAILY. 90 tablet 0   atorvastatin (LIPITOR) 40 MG tablet Take 1 tablet (40 mg total) by mouth daily. (Patient taking differently: Take 40 mg by mouth at bedtime.) 90 tablet 0   B Complex-C-Folic Acid (DIALYVITE 800) 0.8 MG TABS Take 1 tablet by mouth daily.  11   blood glucose meter kit and supplies KIT Dispense based on patient and insurance preference. Use up to four times daily as directed. 1 each 0   blood glucose meter kit and supplies Dispense based on patient and insurance preference. Use up to four times daily as directed. (FOR ICD-10 E10.9, E11.9). 1 each 0   Blood Pressure Monitoring (ADULT BLOOD PRESSURE CUFF LG) KIT 1 each by Does not apply route daily. 1 kit 0   carvedilol (COREG) 6.25 MG tablet Take 6.25 mg by mouth 2 (two) times daily with a meal.     Doxercalciferol (HECTOROL IV) Doxercalciferol (Hectorol)     EPINEPHrine 0.3 mg/0.3 mL IJ SOAJ injection Inject 0.3 mg into the muscle as needed  for anaphylaxis. 1 each 0   ferric citrate (AURYXIA) 1 GM 210 MG(Fe) tablet      gabapentin (NEURONTIN) 100 MG capsule Take 2 capsules (200 mg total) by mouth 3 (three) times daily. 540 capsule 0   glucose blood (ACCU-CHEK GUIDE) test strip User to check blood sugar up to 4x per day e11.9 100 each 12   hydrALAZINE (APRESOLINE) 10 MG tablet Take 1 tablet (10 mg total) by mouth 3 (three) times daily. 270 tablet 0   Insulin Pen Needle (EASY TOUCH PEN NEEDLES) 32G X 4 MM MISC 1 each by Does not apply route 3 (three) times daily. 100 each 11   Lancet Devices (ONETOUCH DELICA PLUS LANCING) MISC CHECK BLOOD SUGARS FOURX DAILY 1 each 0   Lancets (ONETOUCH DELICA PLUS LANCET33G) MISC USE TO TEST BLOOD SUGAR AS DIRECTED 100 each 12   LANTUS SOLOSTAR 100 UNIT/ML Solostar Pen Inject 10 Units into the skin daily. 15 mL 0   LOPERAMIDE HCL PO Take by mouth.     LOPERAMIDE HCL PO Take by mouth.     Na Sulfate-K Sulfate-Mg Sulf (SUPREP BOWEL PREP KIT) 17.5-3.13-1.6 GM/177ML SOLN Take 1 kit by mouth as directed. 324 mL 0   omeprazole (PRILOSEC) 40 MG capsule TAKE 1 CAPSULE (40 MG TOTAL) BY MOUTH DAILY. 90 capsule 0   prednisoLONE acetate (PRED FORTE) 1 % ophthalmic suspension SMARTSIG:1 Drop(s) Left Eye Daily PRN  sevelamer carbonate (RENVELA) 800 MG tablet Take 800-1,600 mg by mouth 3 (three) times daily with meals. Take 1,600 mg by mouth three times a day with meals and 800 mg with snacks     TYLENOL 500 MG tablet Take 500-1,000 mg by mouth every 6 (six) hours as needed for mild pain (pain score 1-3) or headache.     Zoster Vaccine Adjuvanted [facility]) injection Administer Shingrix vaccination now and repeat in two months 1 each 1   No current facility-administered medications for this visit.    REVIEW OF SYSTEMS (negative unless checked):   Cardiac:  []  Chest pain or chest pressure? []  Shortness of breath upon activity? []  Shortness of breath when lying flat? []  Irregular heart rhythm?  Vascular:   []  Pain in calf, thigh, or hip brought on by walking? []  Pain in feet at night that wakes you up from your sleep? []  Blood clot in your veins? []  Leg swelling?  Pulmonary:  []  Oxygen at home? []  Productive cough? []  Wheezing?  Neurologic:  []  Sudden weakness in arms or legs? []  Sudden numbness in arms or legs? []  Sudden onset of difficult speaking or slurred speech? []  Temporary loss of vision in one eye? []  Problems with dizziness?  Gastrointestinal:  []  Blood in stool? []  Vomited blood?  Genitourinary:  []  Burning when urinating? []  Blood in urine?  Psychiatric:  []  Major depression  Hematologic:  []  Bleeding problems? []  Problems with blood clotting?  Dermatologic:  []  Rashes or ulcers?  Constitutional:  []  Fever or chills?  Ear/Nose/Throat:  []  Change in hearing? []  Nose bleeds? []  Sore throat?  Musculoskeletal:  []  Back pain? []  Joint pain? []  Muscle pain?   Physical Examination   Vitals:   03/08/24 0914  BP: (!) 152/79  Pulse: 75  Temp: (!) 97.5 F (36.4 C)  TempSrc: Temporal  SpO2: 97%  Weight: 181 lb (82.1 kg)  Height: 5\' 6"  (1.676 m)   Body mass index is 29.21 kg/m.  General:  WDWN in NAD; vital signs documented above Gait: Not observed HENT: WNL, normocephalic Pulmonary: normal non-labored breathing , without rales, rhonchi,  wheezing Cardiac: regular Abdomen: soft, NT, no masses Skin: without rashes Vascular Exam/Pulses: palpable left radial pulse Extremities: Left upper arm fistula with great thrill and palpation.  The fistula is aneurysmal just proximal to the [facility] fossa.  The skin can be easily mobilized around the fistula and there are no active ulcerations Musculoskeletal: no muscle wasting or atrophy  Neurologic: A&O X 3;  No focal weakness or paresthesias are detected Psychiatric:  The pt has Normal affect   Medical Decision Making   [name] is a 58 y.o. female who presents for evaluation of an aneurysmal  fistula  The patient has a history of left brachiobasilic fistula transposition in 2016.  This fistula has been used for dialysis access for several years now She states over the past couple years she has had an aneurysm of the fistula.  This is slowly gotten larger with time.  This does not cause her any pain or discomfort.  This has not caused her any issues at dialysis such as infiltration or prolonged bleeding Recent fistulogram on 3/6 demonstrated a widely patent fistula with good inflow and outflow. On exam her fistula has a great thrill.  It is aneurysmal just proximal to the [facility] fossa.  The skin can be easily mobilized around the aneurysm and there are no active ulcerations I have explained to the patient that  she has no current indications for fistula revision/plication.  Given that her skin is not tightly adhered to the aneurysm and there are no active ulcerations, I would not pursue plication at this time.  She is aware that she may ultimately require revision of the fistula and possible TDC placement. At this time dialysis can continue to use the fistula and is okay to rotate stick sites.  She can follow-up with our office as needed   Loel Dubonnet PA-C [facility] Office: [phone]  Clinic MD: Karin Lieu

## 2024-03-30 ENCOUNTER — Other Ambulatory Visit: Payer: Self-pay | Admitting: Family Medicine

## 2024-04-11 ENCOUNTER — Ambulatory Visit: Admitting: Cardiology

## 2024-04-16 ENCOUNTER — Other Ambulatory Visit: Payer: Self-pay | Admitting: Family Medicine

## 2024-04-16 DIAGNOSIS — I159 Secondary hypertension, unspecified: Secondary | ICD-10-CM

## 2024-05-01 ENCOUNTER — Ambulatory Visit: Admitting: Family Medicine

## 2024-05-31 ENCOUNTER — Encounter: Payer: Self-pay | Admitting: Cardiology

## 2024-05-31 ENCOUNTER — Ambulatory Visit: Attending: Cardiology | Admitting: Cardiology

## 2024-05-31 VITALS — BP 162/76 | HR 80 | Ht 67.0 in | Wt 185.0 lb

## 2024-05-31 DIAGNOSIS — I10 Essential (primary) hypertension: Secondary | ICD-10-CM | POA: Diagnosis not present

## 2024-05-31 DIAGNOSIS — R079 Chest pain, unspecified: Secondary | ICD-10-CM | POA: Diagnosis not present

## 2024-05-31 NOTE — Progress Notes (Signed)
 Cardiology CONSULT Note    Date:  05/31/2024   ID:  SHAELEIGH GRAW, DOB [birth year], MRN [id number]  PCP:  Romelle Booty, MD  Cardiologist:  Wilbert Bihari, MD   Chief Complaint  Patient presents with   New Patient (Initial Visit)    Chest pain    Patient Profile: [name] is a 58 y.o. female who is being seen today for the evaluation of chest pain at the request of Romelle Booty, MD.  History of Present Illness:  [name] is a 58 y.o. female who is being seen today for the evaluation of chest pain at the request of Romelle Booty, MD.  This is a 58 year old female with a history of alcohol abuse, ESRD on HD, enterococcal bacteremia, legally blind in the left eye, liver abscess, diabetes mellitus 2, hypertension who is referred for complaints of chest pain.  Apparently was seen back 01/22/2023 by her PCP completed an episode of chest pain that lasted all day feeling like a pressure and tingling in her chest.  It radiated down her left arm.  It occurred at rest.  There were no associated sx of nausea, diaphoresis or SOB. She has not had any episodes since then.  She is now referred for cardiology evaluation.  She denies any current chest pain or pressure, SOB, DOE, PND, orthopnea, LE edema, dizziness, palpitations or syncope. She is compliant with her meds and is tolerating meds with no SE.    Past Medical History:  Diagnosis Date   Alcohol dependency (HCC)    Anemia    Appendicitis 12/06/2018   Chronic kidney disease    Diabetes mellitus    type 2   Diabetes mellitus type 2, uncontrolled    End stage renal disease (HCC) 05/05/2016   Starting 04/2016, LUE fistula   Enterococcal bacteremia    Hypertension    Legally blind in left eye, as defined in [place]     Liver abscess 12/20/2018   Necrotizing fasciitis (HCC)    PONV (postoperative nausea and vomiting)    Sepsis without acute organ dysfunction (HCC)    Type 2 diabetes mellitus with diabetic chronic kidney disease  (HCC) 05/05/2016    Past Surgical History:  Procedure Laterality Date   A/V FISTULAGRAM N/A 02/09/2024   Procedure: A/V Fistulagram;  Surgeon: Melia Lynwood ORN, MD;  [facility]: [facility];  Service: Cardiovascular;  Laterality: N/A;   AMPUTATION     right first and left middle finger amputation 2/2 OM 08/2011 by Dr. Camella   AMPUTATION  01/18/2012   Procedure: AMPUTATION DIGIT;  Surgeon: Franky JONELLE Curia, MD;  Location: [facility];  Service: Orthopedics;  Laterality: Left;  revision amputation left thumb   AMPUTATION  08/25/2012   Procedure: AMPUTATION BELOW KNEE;  Surgeon: Jerona LULLA Sage, MD;  Location: [facility];  Service: Orthopedics;  Laterality: Right;  Right Below Knee Amputation   APPENDECTOMY     BASCILIC VEIN TRANSPOSITION Left 07/22/2015   Procedure: BASILIC VEIN TRANSPOSITION ;  Surgeon: Carlin FORBES Haddock, MD;  Location: [facility];  Service: Vascular;  Laterality: Left;   CATARACT EXTRACTION     right    CESAREAN SECTION  1986   CESAREAN SECTION  1985   CESAREAN SECTION  1991   COLONOSCOPY WITH PROPOFOL  N/A 08/25/2023   Procedure: COLONOSCOPY WITH PROPOFOL ;  Surgeon: Aneita Gwendlyn DASEN, MD;  Location: [facility];  Service: Gastroenterology;  Laterality: N/A;   I & D EXTREMITY  12/24/2011  Procedure: IRRIGATION AND DEBRIDEMENT EXTREMITY;  Surgeon: Franky JONELLE Curia, MD;  Location: [facility];  Service: Orthopedics;  Laterality: Left;  I&Dleft thumb with partial amputation   I & D EXTREMITY  12/26/2011   Procedure: IRRIGATION AND DEBRIDEMENT EXTREMITY;  Surgeon: Franky JONELLE Curia, MD;  Location: [facility];  Service: Orthopedics;  Laterality: Left;   I & D EXTREMITY  12/23/2011   Procedure: IRRIGATION AND DEBRIDEMENT EXTREMITY;  Surgeon: Franky JONELLE Curia, MD;  Location: [facility];  Service: Orthopedics;  Laterality: Left;  I&D Left thumb, hand and arm   I&D left thumb  12/23/2011   IR RADIOLOGIST EVAL & MGMT  01/04/2019   IR RADIOLOGIST EVAL & MGMT  01/25/2019   LAPAROSCOPIC APPENDECTOMY N/A 12/06/2018    Procedure: APPENDECTOMY LAPAROSCOPIC;  Surgeon: Kinsinger, Herlene Righter, MD;  Location: [facility];  Service: General;  Laterality: N/A;   POLYPECTOMY  08/25/2023   Procedure: POLYPECTOMY;  Surgeon: Aneita Gwendlyn DASEN, MD;  Location: [facility];  Service: Gastroenterology;;   TUBAL LIGATION      Current Medications: Current Meds  Medication Sig   amlodipine -olmesartan  (AZOR ) 10-20 MG tablet TAKE 1 TABLET BY MOUTH DAILY.   atorvastatin  (LIPITOR) 40 MG tablet Take 1 tablet (40 mg total) by mouth daily. (Patient taking differently: Take 40 mg by mouth at bedtime.)   B Complex-C-Folic Acid  (DIALYVITE  800) 0.8 MG TABS Take 1 tablet by mouth daily.   blood glucose meter kit and supplies KIT Dispense based on patient and insurance preference. Use up to four times daily as directed.   blood glucose meter kit and supplies Dispense based on patient and insurance preference. Use up to four times daily as directed. (FOR ICD-10 E10.9, E11.9).   Blood Pressure Monitoring (ADULT BLOOD PRESSURE CUFF LG) KIT 1 each by Does not apply route daily.   carvedilol  (COREG ) 6.25 MG tablet Take 6.25 mg by mouth 2 (two) times daily with a meal.   Doxercalciferol  (HECTOROL  IV) Doxercalciferol  (Hectorol )   EPINEPHrine  0.3 mg/0.3 mL IJ SOAJ injection Inject 0.3 mg into the muscle as needed for anaphylaxis.   ferric citrate  (AURYXIA ) 1 GM 210 MG(Fe) tablet    gabapentin  (NEURONTIN ) 100 MG capsule Take 2 capsules (200 mg total) by mouth 3 (three) times daily.   glucose blood (ACCU-CHEK GUIDE) test strip User to check blood sugar up to 4x per day e11.9   hydrALAZINE  (APRESOLINE ) 10 MG tablet Take 1 tablet (10 mg total) by mouth 3 (three) times daily.   Insulin  Pen Needle (EASY TOUCH PEN NEEDLES) 32G X 4 MM MISC 1 each by Does not apply route 3 (three) times daily.   Lancet Devices (ONETOUCH DELICA PLUS LANCING) MISC CHECK BLOOD SUGARS FOURX DAILY   Lancets (ONETOUCH DELICA PLUS LANCET33G) MISC USE TO TEST BLOOD SUGAR AS DIRECTED    LANTUS  SOLOSTAR 100 UNIT/ML Solostar Pen Inject 10 Units into the skin daily.   LOPERAMIDE HCL PO Take by mouth.   LOPERAMIDE HCL PO Take by mouth.   Na Sulfate-K Sulfate-Mg Sulf (SUPREP BOWEL PREP KIT) 17.5-3.13-1.6 GM/177ML SOLN Take 1 kit by mouth as directed.   omeprazole  (PRILOSEC) 40 MG capsule TAKE 1 CAPSULE (40 MG TOTAL) BY MOUTH DAILY.   prednisoLONE acetate (PRED FORTE) 1 % ophthalmic suspension SMARTSIG:1 Drop(s) Left Eye Daily PRN   sevelamer carbonate (RENVELA) 800 MG tablet Take 800-1,600 mg by mouth 3 (three) times daily with meals. Take 1,600 mg by mouth three times a day with meals and 800 mg with snacks  TYLENOL  500 MG tablet Take 500-1,000 mg by mouth every 6 (six) hours as needed for mild pain (pain score 1-3) or headache.   Zoster Vaccine Adjuvanted (SHINGRIX ) injection Administer Shingrix  vaccination now and repeat in two months    Allergies:   Shrimp extract   Social History   Socioeconomic History   Marital status: Single    Spouse name: Not on file   Number of children: 3   Years of education: 10   Highest education level: Not on file  Occupational History    Employer: [employer]  Tobacco Use   Smoking status: Never    Passive exposure: Never   Smokeless tobacco: Never  Vaping Use   Vaping status: Never Used  Substance and Sexual Activity   Alcohol use: Yes    Comment: h/o alcohol abuse 4 years ago (07/21/15)   Drug use: Yes    Types: Cocaine    Comment:  last used cocaine 4 years ago (07/21/15)   Sexual activity: Yes    Birth control/protection: Surgical  Other Topics Concern   Not on file  Social History Narrative   Patient lives with her boyfriend in [place].    Patient enjoys watching TV, and spending time with her children and grandchildren.    Patient is legally blind and is in a wheel chair. Patient relies on [employer] transportation.    Social Drivers of Corporate investment banker Strain: Low Risk  (09/27/2023)   Overall Financial Resource Strain  (CARDIA)    Difficulty of Paying Living Expenses: Not hard at all  Food Insecurity: No Food Insecurity (09/27/2023)   Hunger Vital Sign    Worried About Running Out of Food in the Last Year: Never true    Ran Out of Food in the Last Year: Never true  Transportation Needs: No Transportation Needs (09/27/2023)   PRAPARE - Administrator, Civil Service (Medical): No    Lack of Transportation (Non-Medical): No  Physical Activity: Inactive (09/27/2023)   Exercise Vital Sign    Days of Exercise per Week: 0 days    Minutes of Exercise per Session: 0 min  Stress: No Stress Concern Present (09/27/2023)   [employer] of Occupational Health - Occupational Stress Questionnaire    Feeling of Stress : Not at all  Social Connections: Unknown (09/27/2023)   Social Connection and Isolation Panel    Frequency of Communication with Friends and Family: More than three times a week    Frequency of Social Gatherings with Friends and Family: Once a week    Attends Religious Services: More than 4 times per year    Active Member of [employer] or Organizations: No    Attends Engineer, structural: Never    Marital Status: Not on file     Family History:  The patient's family history includes Alcohol abuse in her mother; Alzheimer's disease in her father; Diabetes in her father, mother, and sister; High blood pressure in her sister; Hypertension in her father; Kidney failure in her sister.   ROS:   Please see the history of present illness.    ROS All other systems reviewed and are negative.      No data to display             PHYSICAL EXAM:   VS:  BP (!) 162/76   Pulse 80   Ht 5' 7 (1.702 m)   Wt 185 lb (83.9 kg)   LMP 03/07/2019   SpO2 98%  BMI 28.98 kg/m    GEN: Well nourished, well developed, in no acute distress  HEENT: normal  Neck: no JVD, carotid bruits, or masses Cardiac: RRR; no murmurs, rubs, or gallops,no edema.  Intact distal pulses bilaterally.   Respiratory:  clear to auscultation bilaterally, normal work of breathing GI: soft, nontender, nondistended, + BS MS: no deformity or atrophy  Skin: warm and dry, no rash Neuro:  Alert and Oriented x 3, Strength and sensation are intact Psych: euthymic mood, full affect  Wt Readings from Last 3 Encounters:  05/31/24 185 lb (83.9 kg)  03/08/24 181 lb (82.1 kg)  01/17/24 181 lb 6 oz (82.3 kg)      Studies/Labs Reviewed:   EKG Interpretation Date/Time:  Thursday May 31 2024 09:41:57 EDT Ventricular Rate:  80 PR Interval:  194 QRS Duration:  70 QT Interval:  394 QTC Calculation: 454 R Axis:   -7  Text Interpretation: Normal sinus rhythm Possible Left atrial enlargement Septal infarct , age undetermined When compared with ECG of 22-Dec-2018 11:05, No significant change was found Confirmed by Shlomo Corning [phone]) on 05/31/2024 10:14:19 AM  EKG Interpretation Date/Time:  Thursday May 31 2024 09:41:57 EDT Ventricular Rate:  80 PR Interval:  194 QRS Duration:  70 QT Interval:  394 QTC Calculation: 454 R Axis:   -7  Text Interpretation: Normal sinus rhythm Possible Left atrial enlargement Septal infarct , age undetermined When compared with ECG of 22-Dec-2018 11:05, No significant change was found Confirmed by Shlomo Corning [phone]) on 05/31/2024 10:14:19 AM       Recent Labs: 10/02/2023: ALT 13; BUN 63; Creatinine, Ser 9.24; Potassium 5.5; Sodium 140 01/17/2024: Hemoglobin 10.7; Platelets 157   Lipid Panel    Component Value Date/Time   CHOL 106 11/22/2023 1453   TRIG 64 11/22/2023 1453   HDL 51 11/22/2023 1453   CHOLHDL 2.1 11/22/2023 1453   CHOLHDL 3.2 08/25/2012 0530   VLDL 33 08/25/2012 0530   LDLCALC 41 11/22/2023 1453    Additional studies/ records that were reviewed today include:  PCP notes by Dr. Edison    ASSESSMENT:    1. Chest pain of uncertain etiology   2. Benign essential HTN      PLAN:  In order of problems listed above:  Chest pain -  This was 1 episode that occurred at rest back in December feeling like a pressure and tingling in her chest with radiation down the left arm - She certainly has multiple cardiac risk factors including hypertension, diabetes mellitus 2 and HLD - I have personally reviewed and interpreted outside labs performed by patient's PCP which showed LDL 41, HDL, 51 on 11/2023 - Cannot get a coronary CTA given her end-stage renal disease which would yield likely heavy calcification and poor quality study - Will get a stress PET CT to rule out ischemia -Informed Consent   Shared Decision Making/Informed Consent The risks [chest pain, shortness of breath, cardiac arrhythmias, dizziness, blood pressure fluctuations, myocardial infarction, stroke/transient ischemic attack, nausea, vomiting, allergic reaction, radiation exposure, metallic taste sensation and life-threatening complications (estimated to be 1 in 10,000)], benefits (risk stratification, diagnosing coronary artery disease, treatment guidance) and alternatives of a cardiac PET stress test were discussed in detail with Ms. Mihelich and she agrees to proceed. - Check 2D echo to assess LV function  Hypertension - BP borderline controlled on exam today - Continue amlodipine  -olmesartan  10/20 mg daily, carvedilol  6.25 mg twice daily, hydralazine  10 mg 3 times daily with as needed refills -  BP meds managed by nephrology  HLD -LDL goal < 70 -I have personally reviewed and interpreted outside labs performed by patient's PCP which showed LDL 41 and HDL 51 on 11/2023 -continue Atorvastatin  40mg  daily with PRN refills  Time Spent: 20 minutes total time of encounter, including 15 minutes spent in face-to-face patient care on the date of this encounter. This time includes coordination of care and counseling regarding above mentioned problem list. Remainder of non-face-to-face time involved reviewing chart documents/testing relevant to the patient encounter and  documentation in the medical record. I have independently reviewed documentation from referring provider  Followup:  PRN pending results of stress test  Medication Adjustments/Labs and Tests Ordered: Current medicines are reviewed at length with the patient today.  Concerns regarding medicines are outlined above.  Medication changes, Labs and Tests ordered today are listed in the Patient Instructions below.  There are no Patient Instructions on file for this visit.   Signed, Wilbert Bihari, MD  05/31/2024 10:22 AM    [facility] [address] [place], [place], [place]  [address] Phone: [phone]; Fax: [phone]

## 2024-05-31 NOTE — Addendum Note (Signed)
 Addended by: SHLOMO CORNING R on: 05/31/2024 11:08 AM   Modules accepted: Orders

## 2024-05-31 NOTE — Patient Instructions (Addendum)
 Medication Instructions:  Your physician recommends that you continue on your current medications as directed. Please refer to the Current Medication list given to you today.  *If you need a refill on your cardiac medications before your next appointment, please call your pharmacy*  Lab Work: None.  If you have labs (blood work) drawn today and your tests are completely normal, you will receive your results only by: MyChart Message (if you have MyChart) OR A paper copy in the mail If you have any lab test that is abnormal or we need to change your treatment, we will call you to review the results.  Testing/Procedures: Your physician has requested that you have an echocardiogram. Echocardiography is a painless test that uses sound waves to create images of your heart. It provides your doctor with information about the size and shape of your heart and how well your heart's chambers and valves are working. This procedure takes approximately one hour. There are no restrictions for this procedure. Please do NOT wear cologne, perfume, aftershave, or lotions (deodorant is allowed). Please arrive 15 minutes prior to your appointment time.  Please note: We ask at that you not bring children with you during ultrasound (echo/ vascular) testing. Due to room size and safety concerns, children are not allowed in the ultrasound rooms during exams. Our front office staff cannot provide observation of children in our lobby area while testing is being conducted. An adult accompanying a patient to their appointment will only be allowed in the ultrasound room at the discretion of the ultrasound technician under special circumstances. We apologize for any inconvenience.     Please report to Radiology at the [facility] Main Entrance 30 minutes early for your test.  [address] [place], [place] [address]                         How to Prepare for Your Cardiac PET/CT Stress Test:  Nothing to eat  or drink, except water, 3 hours prior to arrival time.  NO caffeine/decaffeinated products, or chocolate 12 hours prior to arrival. (Please note decaffeinated beverages (teas/coffees) still contain caffeine).  If you have caffeine within 12 hours prior, the test will need to be rescheduled.  Medication instructions: Do not take erectile dysfunction medications for 72 hours prior to test (sildenafil, tadalafil) Do not take nitrates (isosorbide mononitrate, Ranexa) the day before or day of test Do not take tamsulosin the day before or morning of test Hold theophylline containing medications for 12 hours. Hold Dipyridamole 48 hours prior to the test.  Diabetic Preparation: If able to eat breakfast prior to 3 hour fasting, you may take all medications, including your insulin . Do not worry if you miss your breakfast dose of insulin  - start at your next meal. If you do not eat prior to 3 hour fast-Hold all diabetes (oral and insulin ) medications. Patients who wear a continuous glucose monitor MUST remove the device prior to scanning.  You may take your remaining medications with water.  NO perfume, cologne or lotion on chest or abdomen area. FEMALES - Please avoid wearing dresses to this appointment.  Total time is 1 to 2 hours; you may want to bring reading material for the waiting time.  IF YOU THINK YOU MAY BE PREGNANT, OR ARE NURSING PLEASE INFORM THE TECHNOLOGIST.  In preparation for your appointment, medication and supplies will be purchased.  Appointment availability is limited, so if you need to cancel or  reschedule, please call the Radiology Department Scheduler at [phone] 24 hours in advance to avoid a cancellation fee of $100.00  What to Expect When you Arrive:  Once you arrive and check in for your appointment, you will be taken to a preparation room within the Radiology Department.  A technologist or Nurse will obtain your medical history, verify that you are correctly prepped  for the exam, and explain the procedure.  Afterwards, an IV will be started in your arm and electrodes will be placed on your skin for EKG monitoring during the stress portion of the exam. Then you will be escorted to the PET/CT scanner.  There, staff will get you positioned on the scanner and obtain a blood pressure and EKG.  During the exam, you will continue to be connected to the EKG and blood pressure machines.  A small, safe amount of a radioactive tracer will be injected in your IV to obtain a series of pictures of your heart along with an injection of a stress agent.    After your Exam:  It is recommended that you eat a meal and drink a caffeinated beverage to counter act any effects of the stress agent.  Drink plenty of fluids for the remainder of the day and urinate frequently for the first couple of hours after the exam.  Your doctor will inform you of your test results within 7-10 business days.  For more information and frequently asked questions, please visit our website: [email or web address]  For questions about your test or how to prepare for your test, please call: Cardiac Imaging Nurse Navigators Office: [phone]   Follow-Up: At [facility], you and your health needs are our priority.  As part of our continuing mission to provide you with exceptional heart care, our providers are all part of one team.  This team includes your primary Cardiologist (physician) and Advanced Practice Providers or APPs (Physician Assistants and Nurse Practitioners) who all work together to provide you with the care you need, when you need it.  Your next appointment will be dependent on the results of your testing and it will be with:     Provider:   Dr. Wilbert Bihari, MD

## 2024-05-31 NOTE — Addendum Note (Signed)
 Addended by: JANIT GENI CROME on: 05/31/2024 10:29 AM   Modules accepted: Orders

## 2024-06-05 ENCOUNTER — Encounter (HOSPITAL_COMMUNITY): Payer: Self-pay | Admitting: Cardiology

## 2024-06-19 ENCOUNTER — Telehealth: Payer: Self-pay | Admitting: Family Medicine

## 2024-06-19 NOTE — Telephone Encounter (Signed)
 Patient would like a referral for an in home aid. Please advise.

## 2024-06-28 ENCOUNTER — Ambulatory Visit: Admitting: Student

## 2024-06-28 ENCOUNTER — Encounter: Payer: Self-pay | Admitting: Student

## 2024-06-28 VITALS — BP 186/89 | HR 81 | Ht 67.0 in | Wt 179.8 lb

## 2024-06-28 DIAGNOSIS — N186 End stage renal disease: Secondary | ICD-10-CM | POA: Diagnosis not present

## 2024-06-28 DIAGNOSIS — I159 Secondary hypertension, unspecified: Secondary | ICD-10-CM | POA: Diagnosis not present

## 2024-06-28 DIAGNOSIS — Z89511 Acquired absence of right leg below knee: Secondary | ICD-10-CM | POA: Diagnosis not present

## 2024-06-28 DIAGNOSIS — Z992 Dependence on renal dialysis: Secondary | ICD-10-CM | POA: Diagnosis not present

## 2024-06-28 NOTE — Assessment & Plan Note (Signed)
-   Follows with nephrology - Difficult to control 2/2 ESRD - Asymptomatic today - Continue to monitor, continue Azor , Coreg  and hydral

## 2024-06-28 NOTE — Patient Instructions (Signed)
 It was great to see you! Thank you for allowing me to participate in your care!   I recommend that you always bring your medications to each appointment as this makes it easy to ensure we are on the correct medications and helps us  not miss when refills are needed.  Our plans for today:  - Once you have an agency picked out, they should perform a evaluation.  If you qualify for personal care services, we will then fill the paperwork if required. - I have sent a referral to our social work coordinators to assist with the process of obtaining personal care services. - Additionally I have reordered DME for wheelchair, you should be notified if you qualify or do not qualify.  Take care and seek immediate care sooner if you develop any concerns. Please remember to show up 15 minutes before your scheduled appointment time!  Gladis Church, DO [facility]

## 2024-06-28 NOTE — Assessment & Plan Note (Signed)
Same plan as above.  

## 2024-06-28 NOTE — Progress Notes (Signed)
    SUBJECTIVE:   CHIEF COMPLAINT / HPI:   ESRD on HD  Right below-knee amputee DME wheelchair need Today patient presents for discussion of personal care services.  She is requesting assistance with cooking and cleaning at home.  We discussed the process of finding PCS agency, and when paperwork will be filled out.  Patient is agreeable to social work consult to assist us  with this process.  Patient has to significant comorbidities that limit her ability to perform actives daily living including ESRD on HD (fatigue after HD sessions, interruption to routine) and knee amputation (wheelchair-bound).  Additionally, patient's current wheelchair is having multiple issues.  On examination, wheelchair is quite old, missing parts and uncomfortable to patient.  We will send DME order for new wheelchair.  Hypertension Asymptomatic.  Adherent to medications.  OBJECTIVE:   BP (!) 186/89   Pulse 81   Ht 5' 7 (1.702 m)   Wt 179 lb 12.8 oz (81.6 kg)   LMP 03/07/2019   SpO2 98%   BMI 28.16 kg/m    General: NAD, well-appearing, well-nourished Respiratory: No respiratory distress, breathing comfortably, able to speak in full sentences Skin: warm and dry, no rashes noted on exposed skin Psych: Appropriate affect and mood  ASSESSMENT/PLAN:   Assessment & Plan Right below-knee amputee (HCC) - Personal care services discussed - [facility] referral sent, assistance with PCS process - DME wheelchair (manual) placed ESRD on dialysis [facility]) - Same plan as above. Secondary hypertension, unspecified - Follows with nephrology - Difficult to control 2/2 ESRD - Asymptomatic today - Continue to monitor, continue Azor , Coreg  and hydral   Gladis Church, DO [facility]

## 2024-07-06 ENCOUNTER — Other Ambulatory Visit: Payer: Self-pay

## 2024-07-06 MED ORDER — OMEPRAZOLE 40 MG PO CPDR: 40.0000 mg | DELAYED_RELEASE_CAPSULE | Freq: Every day | ORAL | 0 refills | Status: DC

## 2024-07-16 ENCOUNTER — Other Ambulatory Visit: Payer: Self-pay | Admitting: Family Medicine

## 2024-07-16 DIAGNOSIS — I159 Secondary hypertension, unspecified: Secondary | ICD-10-CM

## 2024-07-24 ENCOUNTER — Telehealth: Payer: Self-pay | Admitting: *Deleted

## 2024-07-24 NOTE — Progress Notes (Signed)
 Complex Care Management Note  Care Guide Note 07/24/2024 Name: [name] MRN: [id number] DOB: 1966-07-06  [name] is a 58 y.o. year old female who sees Romelle Booty, MD for primary care. I reached out to Dorthea CHRISTELLA Coombs by phone today to offer complex care management services.  [name] was given information about Complex Care Management services today including:   The Complex Care Management services include support from the care team which includes your Nurse Care Manager, Clinical Social Worker, or Pharmacist.  The Complex Care Management team is here to help remove barriers to the health concerns and goals most important to you. Complex Care Management services are voluntary, and the patient may decline or stop services at any time by request to their care team member.   Complex Care Management Consent Status: Patient agreed to services and verbal consent obtained.   Follow up plan:  Telephone appointment with complex care management team member scheduled for:  8/20  Encounter Outcome:  Patient Scheduled  Harlene Satterfield  [facility]  [facility] Guide  Direct Dial : [phone]  Fax [phone]

## 2024-07-25 ENCOUNTER — Other Ambulatory Visit: Payer: Self-pay

## 2024-07-25 ENCOUNTER — Ambulatory Visit: Admitting: Family Medicine

## 2024-07-25 NOTE — Progress Notes (Deleted)
    SUBJECTIVE:   CHIEF COMPLAINT / HPI:   T2DM -Current medication regimen: Lantus  10 units daily*** -Home CBGs:*** -Denies polyuria, polydipsia, abdominal pain, chest pain, shortness of breath*** -Foot exam: *** -Eye exam: ***  Lab Results  Component Value Date   HGBA1C 6.2 11/22/2023   HGBA1C 6.7 01/06/2023   HGBA1C 8.1 (A) 06/24/2022     ***Feet: No notable skin breakdown or wounds.  Sensation intact to sharp and dull sensation in multiple locations on the toes, dorsum of foot, sole, and above the ankle.  Proprioception intact.  Able to bear weight.*** ***  PERTINENT  PMH / PSH: ***  OBJECTIVE:   LMP 03/07/2019   ***  ASSESSMENT/PLAN:   Assessment & Plan    [name] Coward, MD [facility]

## 2024-07-25 NOTE — Patient Outreach (Signed)
 Complex Care Management   Visit Note  07/25/2024  Name:  [name] MRN: [id number] DOB: Aug 22, 1966  Situation: Referral received for Complex Care Management related to Personal Care Services I obtained verbal consent from Patient.  Visit completed with Patient  on the phone  Background:   Past Medical History:  Diagnosis Date   Alcohol dependency (HCC)    Anemia    Appendicitis 12/06/2018   Chronic kidney disease    Diabetes mellitus    type 2   Diabetes mellitus type 2, uncontrolled    End stage renal disease (HCC) 05/05/2016   Starting 04/2016, LUE fistula   Enterococcal bacteremia    Hypertension    Legally blind in left eye, as defined in [place]     Liver abscess 12/20/2018   Necrotizing fasciitis (HCC)    PONV (postoperative nausea and vomiting)    Sepsis without acute organ dysfunction (HCC)    Type 2 diabetes mellitus with diabetic chronic kidney disease (HCC) 05/05/2016    Assessment:  BSW spoke with Ms.Salsberry to assess for SDOH needs; no needs were identified at this time. The initial referral was for personal care services. Together, BSW and patient contacted [employer], who reported that they did not see any services or requests currently in progress. Patient stated that she has previously inquired about personal care services and currently has an individual in the process of being approved as her personal care aide through an agency. The patient explained that her friend must be officially approved before the doctor's form can be requested. BSW will follow up with patient next week to check on the progress of her friend's approval. If her friend is not accepted as her personal care aide, BSW will recommend other options for personal care services.     Recommendation:   No recommendations at this time  Follow Up Plan:   Telephone follow-up 08/03/2024 at 2pm  Orlean Fey, BSW [place]  [facility] Social Worker, [employer] [facility] [phone]

## 2024-07-25 NOTE — Patient Instructions (Signed)
 Visit Information  Thank you for taking time to visit with me today. Please don't hesitate to contact me if I can be of assistance to you before our next scheduled appointment.  Our next appointment is by telephone on 8/29 at 2pm Please call the care guide team at [phone] if you need to cancel or reschedule your appointment.      Please call the Suicide and Crisis Lifeline: 988 call the [place]  National Suicide Prevention Lifeline: 2393861849 or TTY: 803-052-3325 TTY 959-089-2222) to talk to a trained counselor call 1-800-273-TALK (toll free, 24 hour hotline) go to [facility] [address] [place] [phone]) call 911 if you are experiencing a Mental Health or Behavioral Health Crisis or need someone to talk to.  Patient verbalizes understanding of instructions and care plan provided today and agrees to view in MyChart. Active MyChart status and patient understanding of how to access instructions and care plan via MyChart confirmed with patient.     Orlean Fey, BSW [place]  [facility] Social Worker, [facility] [phone]

## 2024-07-26 ENCOUNTER — Telehealth

## 2024-08-01 ENCOUNTER — Ambulatory Visit: Admitting: Family Medicine

## 2024-08-02 ENCOUNTER — Ambulatory Visit (HOSPITAL_COMMUNITY)
Admission: RE | Admit: 2024-08-02 | Discharge: 2024-08-02 | Disposition: A | Discharge status: Home / Self Care | Source: Ambulatory Visit | Attending: Cardiology | Admitting: Cardiology

## 2024-08-02 DIAGNOSIS — R079 Chest pain, unspecified: Secondary | ICD-10-CM | POA: Diagnosis present

## 2024-08-02 LAB — ECHOCARDIOGRAM COMPLETE
Area-P 1/2: 4.83 cm2
S' Lateral: 3.38 cm

## 2024-08-03 ENCOUNTER — Other Ambulatory Visit: Payer: Self-pay

## 2024-08-03 NOTE — Patient Instructions (Signed)

## 2024-08-03 NOTE — Patient Outreach (Signed)
 BSW spoke with patient today to follow up regarding personal care services. Patient reported that her friend was finally able to make the payment to complete the required test so that she may serve as the patient's personal care aide. Patient stated that paperwork will be turned in on Tuesday, after which the agency will send the forms to the clinic for enrollment and physician sign-off. During the call, patient inquired about obtaining a wheelchair and stated she plans to discuss this with her doctor at her upcoming appointment on August 09, 2024. BSW also assisted patient in rescheduling her appointment with the LCSW to August 21, 2024, at 3:00 PM.  No further follow-up is needed at this time. BSW will close the case and informed the patient that she may reach out if any further assistance is needed.  Orlean Fey, BSW [place]  [facility] Social Worker, [facility] [phone]

## 2024-08-04 ENCOUNTER — Ambulatory Visit: Payer: Self-pay | Admitting: Cardiology

## 2024-08-04 ENCOUNTER — Encounter: Payer: Self-pay | Admitting: Cardiology

## 2024-08-04 DIAGNOSIS — I272 Pulmonary hypertension, unspecified: Secondary | ICD-10-CM | POA: Insufficient documentation

## 2024-08-07 ENCOUNTER — Other Ambulatory Visit: Payer: Self-pay

## 2024-08-07 ENCOUNTER — Telehealth: Admitting: Licensed Clinical Social Worker

## 2024-08-07 DIAGNOSIS — I272 Pulmonary hypertension, unspecified: Secondary | ICD-10-CM

## 2024-08-07 NOTE — Telephone Encounter (Signed)
 Call to patient to discuss echo results. Patient verbalizes understanding that Echo showed normal LVF EF 60-65%, mildly elevated blood pressure in the blood vessels of the lungs called pulmonary HTN, trivial MR.  Patient agrees to home sleep study, VQ scan to rule out chronic PE's and Sed rate/ANA/CRP/RF and PFTs with DLCO.  Orders placed.

## 2024-08-07 NOTE — Telephone Encounter (Signed)
-----   Message from Wilbert Bihari sent at 08/04/2024  1:41 PM EDT ----- Echo showed normal LVF EF 60-65%, mildly elevated blood pressure in the blood vessels of the lungs called pulmonary HTN, trivial MR.  Please order a [place] HST to rule out OSA as cause of Pulmonary  HTN.  Please get a VQ scan to rule out chronic PE's and Sed rate/ANA/CRP/RF all for PHTN.  Also get  PFTs with DLCO.  Repeat echo in 1 year for PHTN ----- Message ----- From: Interface, [employer] [facility] Sent: 08/02/2024   4:21 PM EDT To: Wilbert JONELLE Bihari, MD

## 2024-08-09 ENCOUNTER — Ambulatory Visit: Admitting: Family Medicine

## 2024-08-09 NOTE — Progress Notes (Deleted)
    SUBJECTIVE:   CHIEF COMPLAINT / HPI:   Medication management ***  Needs wheelchair ***  T2DM -Current medication regimen: was previously on lantus  5u daily but this was stopped due to symptomatic hypoglycemia. -Home CBGs:*** -Denies polyuria, polydipsia, abdominal pain, chest pain, shortness of breath*** -Foot exam: *** -Eye exam: ***  Lab Results  Component Value Date   HGBA1C 6.2 11/22/2023   HGBA1C 6.7 01/06/2023   HGBA1C 8.1 (A) 06/24/2022     ***Feet: No notable skin breakdown or wounds.  Sensation intact to sharp and dull sensation in multiple locations on the toes, dorsum of foot, sole, and above the ankle.  Proprioception intact.  Able to bear weight.***  PERTINENT  PMH / PSH: ***  OBJECTIVE:   LMP 03/07/2019   ***  ASSESSMENT/PLAN:   Assessment & Plan    Payton Coward, MD [facility]

## 2024-08-13 NOTE — Progress Notes (Deleted)
    SUBJECTIVE:   CHIEF COMPLAINT / HPI:   Medication management ***  Needs wheelchair ***  T2DM -Current medication regimen: was previously on lantus  5u daily but this was stopped due to symptomatic hypoglycemia. -Home CBGs:*** -Denies polyuria, polydipsia, abdominal pain, chest pain, shortness of breath*** -Foot exam: *** -Eye exam: ***  Lab Results  Component Value Date   HGBA1C 6.2 11/22/2023   HGBA1C 6.7 01/06/2023   HGBA1C 8.1 (A) 06/24/2022     ***Feet: No notable skin breakdown or wounds.  Sensation intact to sharp and dull sensation in multiple locations on the toes, dorsum of foot, sole, and above the ankle.  Proprioception intact.  Able to bear weight.***  PERTINENT  PMH / PSH: ***  OBJECTIVE:   LMP 03/07/2019   ***  ASSESSMENT/PLAN:   Assessment & Plan Type 2 diabetes mellitus with chronic kidney disease on chronic dialysis, unspecified whether long term insulin  use (HCC)    Payton Coward, MD [facility]

## 2024-08-14 ENCOUNTER — Ambulatory Visit: Admitting: Family Medicine

## 2024-08-14 DIAGNOSIS — E1122 Type 2 diabetes mellitus with diabetic chronic kidney disease: Secondary | ICD-10-CM

## 2024-08-15 ENCOUNTER — Other Ambulatory Visit: Payer: Self-pay | Admitting: Family Medicine

## 2024-08-15 DIAGNOSIS — E785 Hyperlipidemia, unspecified: Secondary | ICD-10-CM

## 2024-08-20 ENCOUNTER — Telehealth (HOSPITAL_COMMUNITY): Payer: Self-pay | Admitting: *Deleted

## 2024-08-20 NOTE — Telephone Encounter (Signed)
 Attempted to call patient regarding upcoming cardiac PET appointment. Unable to leave VM. Johney Frame RN Navigator Cardiac Imaging [facility] [phone] Office

## 2024-08-21 ENCOUNTER — Other Ambulatory Visit: Payer: Self-pay | Admitting: Licensed Clinical Social Worker

## 2024-08-21 ENCOUNTER — Encounter (HOSPITAL_COMMUNITY): Admission: RE | Admit: 2024-08-21 | Source: Ambulatory Visit

## 2024-08-21 NOTE — Patient Outreach (Signed)
 Complex Care Management   Visit Note  08/21/2024  Name:  [name] MRN: [id number] DOB: 10/07/1966  Situation: Referral received for Complex Care Management related to SDOH Barriers:  in home support. I obtained verbal consent from Patient.  Visit completed with Patient  on the phone  Background:   Past Medical History:  Diagnosis Date   Alcohol dependency (HCC)    Anemia    Appendicitis 12/06/2018   Chronic kidney disease    Diabetes mellitus    type 2   Diabetes mellitus type 2, uncontrolled    End stage renal disease (HCC) 05/05/2016   Starting 04/2016, LUE fistula   Enterococcal bacteremia    Hypertension    Legally blind in left eye, as defined in [place]     Liver abscess 12/20/2018   Necrotizing fasciitis (HCC)    PONV (postoperative nausea and vomiting)    Pulmonary HTN (HCC)    mild with PASP on echo 07/2024   Sepsis without acute organ dysfunction (HCC)    Type 2 diabetes mellitus with diabetic chronic kidney disease (HCC) 05/05/2016    Assessment: Patient Reported Symptoms: Patient reports appreciation for call but shares that she is already addressing her in home support needs and has a plan. She reports that she spoke with [facility] BSW already and was able to gain some helpful information and resources. She denies needing senior support, BH or socialization resources at this time. She has a set appointment with PCP to complete PCS application so that she can gain an in home aide covered by her Medicaid insurance. Patient denies any further clinical social work needs at this time and is agreeable to contact this [facility] LCSW in the future if needed. [facility] LCSW will sign off at this time and will update PCP.   Cognitive Cognitive Status: Able to follow simple commands, Alert and oriented to person, place, and time Cognitive/Intellectual Conditions Management [RPT]: None reported or documented in medical history or problem list   Health Maintenance Behaviors: Annual  physical exam, Immunizations Healing Pattern: Average Health Facilitated by: Rest, Stress management  Neurological Neurological Review of Symptoms: No symptoms reported Neurological Management Strategies: Adequate rest, Routine screening Neurological Self-Management Outcome: 4 (good)  Psychosocial Psychosocial Symptoms Reported: No symptoms reported          08/21/2024    PHQ2-9 Depression Screening   Little interest or pleasure in doing things Not at all  Feeling down, depressed, or hopeless Not at all  PHQ-2 - Total Score 0   There were no vitals filed for this visit.  Medications Reviewed Today     Reviewed by Merlynn Lyle CROME, LCSW (Social Worker) on 08/21/24 at 1544  Med List Status: <None>   Medication Order Taking? Sig Documenting Provider Last Dose Status Informant  amlodipine -olmesartan  (AZOR ) 10-20 MG tablet [id number]  TAKE 1 TABLET BY MOUTH DAILY. Romelle Booty, MD  Active   atorvastatin  (LIPITOR) 40 MG tablet [id number]  TAKE 1 TABLET (40 MG TOTAL) BY MOUTH DAILY. Romelle Booty, MD  Active   B Complex-C-Folic Acid  (DIALYVITE  800) 0.8 MG TABS [id number]  Take 1 tablet by mouth daily. [provider]  Active            Med Note MARISA, NATHANEL LOISE Repress Oct 02, 2023  7:37 PM)    blood glucose meter kit and supplies [id number]  Dispense based on patient and insurance preference. Use up to four times daily as directed. (FOR ICD-10 E10.9, E11.9). Mahoney, Caitlin,  MD  Active   blood glucose meter kit and supplies KIT [id number]  Dispense based on patient and insurance preference. Use up to four times daily as directed. Romelle Booty, MD  Active   Blood Pressure Monitoring (ADULT BLOOD PRESSURE CUFF LG) KIT [id number]  1 each by Does not apply route daily. Henriette Mora, MD  Active   carvedilol  (COREG ) 6.25 MG tablet [id number]  Take 6.25 mg by mouth 2 (two) times daily with a meal. [provider]  Active Self  Doxercalciferol  (HECTOROL  IV) [id number]  Doxercalciferol   (Hectorol ) [provider]  Active   EPINEPHrine  0.3 mg/0.3 mL IJ SOAJ injection [id number]  Inject 0.3 mg into the muscle as needed for anaphylaxis. Franklyn Sid SAILOR, MD  Active   ferric citrate  (AURYXIA ) 1 GM 210 MG(Fe) tablet [id number]   [provider]  Active   gabapentin  (NEURONTIN ) 100 MG capsule [id number]  Take 2 capsules (200 mg total) by mouth 3 (three) times daily. Romelle Booty, MD  Active   glucose blood (ACCU-CHEK GUIDE) test strip [id number]  User to check blood sugar up to 4x per day e11.9 Romelle Booty, MD  Active   hydrALAZINE  (APRESOLINE ) 10 MG tablet [id number]  Take 1 tablet (10 mg total) by mouth 3 (three) times daily. Romelle Booty, MD  Active   Insulin  Pen Needle (EASY TOUCH PEN NEEDLES) 32G X 4 MM MISC [id number]  1 each by Does not apply route 3 (three) times daily. Rumball, Alison M, DO  Active   Lancet Devices [facility] DELICA PLUS LANCING) MISC [id number]  CHECK BLOOD SUGARS FOURX DAILY Henriette Mora, MD  Active   Lancets [facility] CATHRYNE PLUS [place]) [place] [id number]  USE TO TEST BLOOD SUGAR AS DIRECTED Mahoney, Caitlin, MD  Active   LANTUS  SOLOSTAR 100 UNIT/ML Solostar Pen [id number]  Inject 10 Units into the skin daily. Romelle Booty, MD  Active Self  LOPERAMIDE HCL PO [id number]  Take by mouth. [provider]  Active   LOPERAMIDE HCL PO [id number]  Take by mouth. [provider]  Active   Na Sulfate-K Sulfate-Mg Sulf (SUPREP BOWEL PREP KIT) 17.5-3.13-1.6 GM/177ML SOLN [id number]  Take 1 kit by mouth as directed. Kerman Vina HERO, NP  Active Self  omeprazole  (PRILOSEC) 40 MG capsule [id number]  Take 1 capsule (40 mg total) by mouth daily. Romelle Booty, MD  Active   prednisoLONE acetate (PRED FORTE) 1 % ophthalmic suspension [id number]  SMARTSIG:1 Drop(s) Left Eye Daily PRN [provider]  Active   sevelamer carbonate (RENVELA) 800 MG tablet [id number]  Take 800-1,600 mg by mouth 3 (three) times daily with meals. Take 1,600 mg by  mouth three times a day with meals and 800 mg with snacks [provider]  Active Self  TYLENOL  500 MG tablet [id number]  Take 500-1,000 mg by mouth every 6 (six) hours as needed for mild pain (pain score 1-3) or headache. [provider]  Active Self  Zoster Vaccine Adjuvanted (SHINGRIX ) injection [id number]  Administer Shingrix  vaccination now and repeat in two months Theophilus Pagan, MD  Active             Recommendation:   PCP Follow-up Continue Current Plan of Care  Follow Up Plan:   Closing From:  Complex Care Management  Lyle Rung, BSW, MSW, LCSW Licensed Clinical Social Worker [place]   [facility] [email or web address] Direct Dial : [phone]

## 2024-08-21 NOTE — Patient Instructions (Signed)
 Visit Information  Thank you for taking time to visit with me today. Please don't hesitate to contact me if I can be of assistance to you in the future!   Please call the Suicide and Crisis Lifeline: 988 call the [place]  National Suicide Prevention Lifeline: 740-301-8063 or TTY: 309-339-0385 TTY 410-751-4855) to talk to a trained counselor call 1-800-273-TALK (toll free, 24 hour hotline) go to [facility] [address] [place] [phone]) call 911 if you are experiencing a Mental Health or Behavioral Health Crisis or need someone to talk to.  Patient verbalizes understanding of instructions and care plan provided today and agrees to view in MyChart. Active MyChart status and patient understanding of how to access instructions and care plan via MyChart confirmed with patient.     Lyle Rung, BSW, MSW, LCSW Licensed Clinical Social Worker [employer]   [facility] [email or web address] Direct Dial : [phone]

## 2024-08-23 ENCOUNTER — Ambulatory Visit (HOSPITAL_COMMUNITY)
Admission: RE | Admit: 2024-08-23 | Discharge: 2024-08-23 | Disposition: A | Discharge status: Home / Self Care | Source: Ambulatory Visit | Attending: Cardiology | Admitting: Cardiology

## 2024-08-23 ENCOUNTER — Ambulatory Visit: Payer: Self-pay | Admitting: Cardiology

## 2024-08-23 ENCOUNTER — Encounter (HOSPITAL_COMMUNITY)
Admission: RE | Admit: 2024-08-23 | Discharge: 2024-08-23 | Disposition: A | Discharge status: Home / Self Care | Source: Ambulatory Visit | Attending: Cardiology | Admitting: Cardiology

## 2024-08-23 DIAGNOSIS — I272 Pulmonary hypertension, unspecified: Secondary | ICD-10-CM

## 2024-08-23 MED ORDER — TECHNETIUM TO 99M ALBUMIN AGGREGATED
4.3000 | Freq: Once | INTRAVENOUS | Status: AC | PRN
  Administered 2024-08-23: 4.3 via INTRAVENOUS

## 2024-08-24 NOTE — Telephone Encounter (Signed)
 Attempted to call, no answer. No VM picking up, unable to leave message.

## 2024-08-24 NOTE — Telephone Encounter (Signed)
-----   Message from Wilbert Bihari sent at 08/23/2024  2:02 PM EDT ----- Cxray read out as cardiomegaly but recent echo showed normal LV size.  Likely a pericardial fat pad making it look enlarged on xray.  No further workup needed ----- Message ----- From: Interface, Rad Results In Sent: 08/23/2024  12:04 PM EDT To: Wilbert JONELLE Bihari, MD

## 2024-08-28 ENCOUNTER — Ambulatory Visit (INDEPENDENT_AMBULATORY_CARE_PROVIDER_SITE_OTHER): Admitting: Family Medicine

## 2024-08-28 ENCOUNTER — Telehealth: Payer: Self-pay

## 2024-08-28 ENCOUNTER — Encounter: Payer: Self-pay | Admitting: Family Medicine

## 2024-08-28 VITALS — BP 137/51 | HR 81 | Wt 179.4 lb

## 2024-08-28 DIAGNOSIS — N186 End stage renal disease: Secondary | ICD-10-CM | POA: Diagnosis not present

## 2024-08-28 DIAGNOSIS — E1122 Type 2 diabetes mellitus with diabetic chronic kidney disease: Secondary | ICD-10-CM

## 2024-08-28 DIAGNOSIS — H548 Legal blindness, as defined in USA: Secondary | ICD-10-CM | POA: Diagnosis not present

## 2024-08-28 DIAGNOSIS — Z992 Dependence on renal dialysis: Secondary | ICD-10-CM | POA: Diagnosis not present

## 2024-08-28 DIAGNOSIS — Z794 Long term (current) use of insulin: Secondary | ICD-10-CM | POA: Diagnosis not present

## 2024-08-28 DIAGNOSIS — Z89511 Acquired absence of right leg below knee: Secondary | ICD-10-CM | POA: Diagnosis not present

## 2024-08-28 LAB — POCT GLYCOSYLATED HEMOGLOBIN (HGB A1C): HbA1c, POC (controlled diabetic range): 5.8 % (ref 0.0–7.0)

## 2024-08-28 NOTE — Progress Notes (Signed)
    SUBJECTIVE:   CHIEF COMPLAINT / HPI:   T2DM -Current medication regimen: used to be on insulin  10u daily but no longer on this -Home CBGs: 119-179 -Denies polyuria, polydipsia, abdominal pain, chest pain, shortness of breath -Foot exam: today -Eye exam: appt scheduled  Lab Results  Component Value Date   HGBA1C 5.8 08/28/2024   HGBA1C 6.2 11/22/2023   HGBA1C 6.7 01/06/2023       Needs wheelchair Current wheelchair is very worn out and uncomfortable. The cushion is gone, the wheelchair is old and missing parts, does not work very well anymore  Requesting order for home health aide Needs assistance with cooking cleaning and ADLs at home, she is unable to do this her self due to significant comorbidities including ESRD and R BKA . She is also legally blind  PERTINENT  PMH / PSH: ESRD, DM, legally blind  OBJECTIVE:   BP (!) 137/51   Pulse 81   Wt 179 lb 6.4 oz (81.4 kg)   LMP 03/07/2019   SpO2 100%   BMI 28.10 kg/m   General: NAD, pleasant, in wheelchair Respiratory: No respiratory distress Skin: warm and dry, no rashes noted Psych: Normal affect and mood  L foot: No notable skin breakdown or wounds.  Sensation intact to sharp and dull sensation in multiple locations on the toes, dorsum of foot, sole, and above the ankle.  Proprioception intact.   R foot s/p BKA  ASSESSMENT/PLAN:   Assessment & Plan Type 2 diabetes mellitus with chronic kidney disease on chronic dialysis, with long-term current use of insulin  (HCC) A1c 5.8 today, well controlled Diet controlled Updated med list Neuropathy well controlled on gabapentin  ESRD on HD MWF Right below-knee amputee (HCC) Legally blind Placed DME order for new wheelchair and routed to RN team Placed [facility] order and F2F encounter for aide   Payton Coward, MD [facility]

## 2024-08-28 NOTE — Telephone Encounter (Signed)
 Community message sent to [employer] for manual wheelchair. Will await response.   Veronda Prude, RN

## 2024-08-28 NOTE — Telephone Encounter (Signed)
 Receipt confirmed by [employer].   Chiquita JAYSON English, RN

## 2024-08-28 NOTE — Assessment & Plan Note (Signed)
 A1c 5.8 today, well controlled Diet controlled Updated med list Neuropathy well controlled on gabapentin  ESRD on HD MWF

## 2024-08-28 NOTE — Assessment & Plan Note (Signed)
 Placed DME order for new wheelchair and routed to RN team Placed [facility] order and F2F encounter for aide

## 2024-08-28 NOTE — Patient Instructions (Addendum)
 A1c looks good today  I have placed orders for home health aide and wheelchair

## 2024-08-29 NOTE — Telephone Encounter (Signed)
 Call to patient to advise of negative pulmonary perfusion scan, patient verbalizes understanding.

## 2024-08-29 NOTE — Telephone Encounter (Signed)
-----   Message from Wilbert Bihari sent at 08/27/2024  3:25 PM EDT ----- No evidence of PE ----- Message ----- From: Interface, Rad Results In Sent: 08/27/2024   3:23 PM EDT To: Wilbert JONELLE Bihari, MD

## 2024-09-18 NOTE — Telephone Encounter (Signed)
 Patient LVM on nurse line checking the status of wheel chair order.   Message sent to [employer] for update.

## 2024-09-19 ENCOUNTER — Encounter (HOSPITAL_COMMUNITY): Payer: Self-pay | Admitting: Vascular Surgery

## 2024-09-24 ENCOUNTER — Encounter (HOSPITAL_COMMUNITY): Admission: RE | Payer: Self-pay | Source: Home / Self Care

## 2024-09-24 ENCOUNTER — Ambulatory Visit (HOSPITAL_COMMUNITY): Admission: RE | Admit: 2024-09-24 | Source: Home / Self Care | Admitting: Vascular Surgery

## 2024-09-24 SURGERY — A/V FISTULAGRAM
Anesthesia: LOCAL | Site: Arm Upper | Laterality: Left

## 2024-10-01 ENCOUNTER — Telehealth (HOSPITAL_COMMUNITY): Payer: Self-pay | Admitting: *Deleted

## 2024-10-01 NOTE — Telephone Encounter (Signed)
 Reaching out to patient to offer assistance regarding upcoming cardiac imaging study; pt verbalizes understanding of appt date/time, parking situation and where to check in, pre-test NPO status, and verified current allergies; name and call back number provided for further questions should they arise  Larey Brick RN Navigator Cardiac Imaging [facility] [phone] office [phone] cell  Patient aware to avoid caffeine 12 hours prior to her cardiac PET study.

## 2024-10-02 ENCOUNTER — Encounter (HOSPITAL_COMMUNITY)
Admission: RE | Admit: 2024-10-02 | Discharge: 2024-10-02 | Disposition: A | Discharge status: Home / Self Care | Source: Ambulatory Visit | Attending: Cardiology | Admitting: Cardiology

## 2024-10-02 DIAGNOSIS — R079 Chest pain, unspecified: Secondary | ICD-10-CM | POA: Insufficient documentation

## 2024-10-02 LAB — NM PET CT CARDIAC PERFUSION MULTI W/ABSOLUTE BLOODFLOW
MBFR: 1.21
Nuc Rest EF: 43 %
Nuc Stress EF: 53 %
Rest MBF: 1.07 ml/g/min
Rest Nuclear Isotope Dose: 21.2 mCi
ST Depression (mm): 0 mm
Stress MBF: 1.3 ml/g/min
Stress Nuclear Isotope Dose: 21.2 mCi
TID: 0.7

## 2024-10-02 MED ORDER — RUBIDIUM RB82 GENERATOR (RUBYFILL)
23.6000 | PACK | Freq: Once | INTRAVENOUS | Status: AC
Start: 2024-10-02 — End: 2024-10-02
  Administered 2024-10-02: 21.22 via INTRAVENOUS

## 2024-10-02 MED ORDER — REGADENOSON 0.4 MG/5ML IV SOLN
0.4000 mg | Freq: Once | INTRAVENOUS | Status: AC
  Administered 2024-10-02: 0.4 mg via INTRAVENOUS

## 2024-10-02 MED ORDER — RUBIDIUM RB82 GENERATOR (RUBYFILL)
23.6000 | PACK | Freq: Once | INTRAVENOUS | Status: AC
  Administered 2024-10-02: 21.2 via INTRAVENOUS

## 2024-10-02 MED ORDER — REGADENOSON 0.4 MG/5ML IV SOLN
INTRAVENOUS | Status: AC
  Filled 2024-10-02: qty 5

## 2024-10-02 NOTE — Progress Notes (Signed)
 Pt. Tolerated lexi scan well.

## 2024-10-03 ENCOUNTER — Encounter (HOSPITAL_COMMUNITY): Payer: Self-pay

## 2024-10-03 NOTE — Telephone Encounter (Signed)
 Call to patient to discuss stress/pet/ct results. No answer, no VM picking up. Tried all 3 #'s listed in chart for patient and significant other. No answer, no VM. No [facility] account. Letter sent.

## 2024-10-03 NOTE — Telephone Encounter (Signed)
-----   Message from Wilbert Bihari sent at 10/02/2024 11:17 PM EDT ----- Stress PET CT showed decreased blood flow running down the front of the heart corresponding to the left anterior descending artery.  Heart function also reduced with resting EF 43%.  Findings  consistent with coronary ischemia.  Global myocardial blood flow reserve was highly abnormal as well.  Please have her come in to see an extender this week to get set up for left heart catheterization.  Continue on atorvastatin  and start aspirin 81 mg daily.  Please have her come in fasting the day of  her appointment and check a FLP, LP(a) and ALT ----- Message ----- From: Interface, Rad Results In Sent: 10/02/2024   4:48 PM EDT To: Wilbert JONELLE Bihari, MD

## 2024-10-08 ENCOUNTER — Other Ambulatory Visit: Payer: Self-pay | Admitting: Family Medicine

## 2024-10-08 DIAGNOSIS — E1142 Type 2 diabetes mellitus with diabetic polyneuropathy: Secondary | ICD-10-CM

## 2024-10-11 ENCOUNTER — Encounter (HOSPITAL_COMMUNITY)
Admission: RE | Disposition: A | Discharge status: Home / Self Care | Payer: Self-pay | Source: Home / Self Care | Attending: Vascular Surgery

## 2024-10-11 ENCOUNTER — Ambulatory Visit (HOSPITAL_COMMUNITY)
Admission: RE | Admit: 2024-10-11 | Discharge: 2024-10-11 | Disposition: A | Discharge status: Home / Self Care | Attending: Vascular Surgery | Admitting: Vascular Surgery

## 2024-10-11 ENCOUNTER — Telehealth: Payer: Self-pay | Admitting: Cardiology

## 2024-10-11 ENCOUNTER — Other Ambulatory Visit: Payer: Self-pay

## 2024-10-11 DIAGNOSIS — T82898A Other specified complication of vascular prosthetic devices, implants and grafts, initial encounter: Secondary | ICD-10-CM | POA: Insufficient documentation

## 2024-10-11 DIAGNOSIS — Y832 Surgical operation with anastomosis, bypass or graft as the cause of abnormal reaction of the patient, or of later complication, without mention of misadventure at the time of the procedure: Secondary | ICD-10-CM | POA: Insufficient documentation

## 2024-10-11 DIAGNOSIS — I120 Hypertensive chronic kidney disease with stage 5 chronic kidney disease or end stage renal disease: Secondary | ICD-10-CM | POA: Insufficient documentation

## 2024-10-11 DIAGNOSIS — E1122 Type 2 diabetes mellitus with diabetic chronic kidney disease: Secondary | ICD-10-CM | POA: Insufficient documentation

## 2024-10-11 DIAGNOSIS — Z992 Dependence on renal dialysis: Secondary | ICD-10-CM | POA: Diagnosis not present

## 2024-10-11 DIAGNOSIS — Z79899 Other long term (current) drug therapy: Secondary | ICD-10-CM | POA: Insufficient documentation

## 2024-10-11 DIAGNOSIS — N186 End stage renal disease: Secondary | ICD-10-CM | POA: Insufficient documentation

## 2024-10-11 HISTORY — PX: A/V FISTULAGRAM: CATH118298

## 2024-10-11 LAB — GLUCOSE, CAPILLARY: Glucose-Capillary: 190 mg/dL — ABNORMAL HIGH (ref 70–99)

## 2024-10-11 SURGERY — A/V FISTULAGRAM
Anesthesia: LOCAL | Site: Arm Upper | Laterality: Left

## 2024-10-11 MED ORDER — LIDOCAINE HCL (PF) 1 % IJ SOLN
INTRAMUSCULAR | Status: DC | PRN
  Administered 2024-10-11: 5 mL via INTRADERMAL

## 2024-10-11 MED ORDER — IODIXANOL 320 MG/ML IV SOLN
INTRAVENOUS | Status: DC | PRN
  Administered 2024-10-11: 18 mL

## 2024-10-11 MED ORDER — LIDOCAINE HCL (PF) 1 % IJ SOLN
INTRAMUSCULAR | Status: AC
  Filled 2024-10-11: qty 30

## 2024-10-11 MED ORDER — ASPIRIN 81 MG PO TBEC: 81.0000 mg | DELAYED_RELEASE_TABLET | Freq: Every day | ORAL | Status: AC

## 2024-10-11 MED ORDER — HEPARIN (PORCINE) IN NACL 1000-0.9 UT/500ML-% IV SOLN
INTRAVENOUS | Status: DC | PRN
  Administered 2024-10-11: 500 mL

## 2024-10-11 SURGICAL SUPPLY — 5 items
KIT MICROPUNCTURE NIT STIFF (SHEATH) IMPLANT
KIT PV (KITS) ×2 IMPLANT
SHEATH PROBE COVER 6X72 (BAG) IMPLANT
TRAY PV CATH (CUSTOM PROCEDURE TRAY) ×2 IMPLANT
TUBING CIL FLEX 10 FLL-RA (TUBING) IMPLANT

## 2024-10-11 NOTE — Telephone Encounter (Signed)
  Patient is returning call regarding results 

## 2024-10-11 NOTE — Op Note (Signed)
    Patient name: [name] MRN: [id number] DOB: 1966-08-19 Sex: female  10/11/2024 Pre-operative Diagnosis: end-stage renal disease, malfunction left arm AV fistula with pain and swelling Post-operative diagnosis:  Same Surgeon:  Penne BROCKS. Sheree, MD Procedure Performed: 1.  Percutaneous ultrasound-guided cannulation left arm AV fistula 2.  Left upper extremity fistulogram  Indications: 58 year old female with end-stage renal disease currently dialyzing via left upper arm AV fistula that is noted to be aneurysmal.  More recently she has developed pain and swelling of the left upper extremity.  We have discussed her options and she has agreed to proceed with fistulogram with possible intervention.  Findings: Fistula does have 2 areas of pseudoaneurysmal degeneration but there is no flow-limiting stenosis and no intervention was undertaken.  Fistula is okay for immediate use.   Procedure:  The patient was identified in the holding area and taken to the heart and vascular procedure room.  The patient was then placed supine on the table and prepped and draped in the usual sterile fashion.  A time out was called.  Ultrasound was used to evaluate the left arm AV fistula near the antecubitum.  The area was anesthetized with direct ultrasound visualization with 1% lidocaine  and cannulated with direct ultrasound visualization using a micropuncture needle followed by wire and sheath.  On ultrasound images saved the permanent record.  We performed left upper extremity fistulogram including retrograde imaging with the fistula compressed and with the above findings no intervention was undertaken.  The sheath was removed and cannulation site suture-ligated with 4-0 Monocryl.  She tolerated the procedure without Ameeth complication.  Contrast: 18 cc    [name] C. Sheree, MD [facility] Office: [phone] Pager: [phone]

## 2024-10-11 NOTE — H&P (Signed)
 H+P  History of Present Illness: This is a 58 y.o. female history of end-stage renal disease on dialysis via transposed basilic vein fistula created in 2016.  She has known aneurysmal degeneration of the fistula.  More recently she has pain and swelling with dialysis.  She is here for fistula evaluation consideration of fistulogram.  Past Medical History:  Diagnosis Date   Alcohol dependency (HCC)    Anemia    Appendicitis 12/06/2018   Chronic kidney disease    Diabetes mellitus    type 2   Diabetes mellitus type 2, uncontrolled    End stage renal disease (HCC) 05/05/2016   Starting 04/2016, LUE fistula   Enterococcal bacteremia    Hypertension    Legally blind in left eye, as defined in [place]     Liver abscess 12/20/2018   Necrotizing fasciitis (HCC)    PONV (postoperative nausea and vomiting)    Pulmonary HTN (HCC)    mild with PASP on echo 07/2024   Sepsis without acute organ dysfunction (HCC)    Type 2 diabetes mellitus with diabetic chronic kidney disease (HCC) 05/05/2016    Past Surgical History:  Procedure Laterality Date   A/V FISTULAGRAM N/A 02/09/2024   Procedure: A/V Fistulagram;  Surgeon: Melia Lynwood ORN, MD;  Location: [facility];  Service: Cardiovascular;  Laterality: N/A;   AMPUTATION     right first and left middle finger amputation 2/2 OM 08/2011 by Dr. Camella   AMPUTATION  01/18/2012   Procedure: AMPUTATION DIGIT;  Surgeon: Franky JONELLE Curia, MD;  Location: [facility];  Service: Orthopedics;  Laterality: Left;  revision amputation left thumb   AMPUTATION  08/25/2012   Procedure: AMPUTATION BELOW KNEE;  Surgeon: Jerona LULLA Sage, MD;  Location: [facility] OR;  Service: Orthopedics;  Laterality: Right;  Right Below Knee Amputation   APPENDECTOMY     BASCILIC VEIN TRANSPOSITION Left 07/22/2015   Procedure: BASILIC VEIN TRANSPOSITION ;  Surgeon: Carlin FORBES Haddock, MD;  Location: [facility] OR;  Service: Vascular;  Laterality: Left;   CATARACT EXTRACTION      right    CESAREAN SECTION  1986   CESAREAN SECTION  1985   CESAREAN SECTION  1991   COLONOSCOPY WITH PROPOFOL  N/A 08/25/2023   Procedure: COLONOSCOPY WITH PROPOFOL ;  Surgeon: Aneita Gwendlyn DASEN, MD;  Location: [facility];  Service: Gastroenterology;  Laterality: N/A;   I & D EXTREMITY  12/24/2011   Procedure: IRRIGATION AND DEBRIDEMENT EXTREMITY;  Surgeon: Franky JONELLE Curia, MD;  Location: [facility];  Service: Orthopedics;  Laterality: Left;  I&Dleft thumb with partial amputation   I & D EXTREMITY  12/26/2011   Procedure: IRRIGATION AND DEBRIDEMENT EXTREMITY;  Surgeon: Franky JONELLE Curia, MD;  Location: [facility];  Service: Orthopedics;  Laterality: Left;   I & D EXTREMITY  12/23/2011   Procedure: IRRIGATION AND DEBRIDEMENT EXTREMITY;  Surgeon: Franky JONELLE Curia, MD;  Location: [facility];  Service: Orthopedics;  Laterality: Left;  I&D Left thumb, hand and arm   I&D left thumb  12/23/2011   IR RADIOLOGIST EVAL & MGMT  01/04/2019   IR RADIOLOGIST EVAL & MGMT  01/25/2019   LAPAROSCOPIC APPENDECTOMY N/A 12/06/2018   Procedure: APPENDECTOMY LAPAROSCOPIC;  Surgeon: Stevie Herlene Righter, MD;  Location: [facility];  Service: General;  Laterality: N/A;   POLYPECTOMY  08/25/2023   Procedure: POLYPECTOMY;  Surgeon: Aneita Gwendlyn DASEN, MD;  Location: [facility];  Service: Gastroenterology;;   TUBAL LIGATION      Allergies  Allergen  Reactions   Shrimp Extract Nausea And Vomiting, Swelling and Other (See Comments)    Patient ended up in the ED after eating this- left eye is puffy    Prior to Admission medications   Medication Sig Start Date End Date Taking? Authorizing Provider  amlodipine -olmesartan  (AZOR ) 10-20 MG tablet TAKE 1 TABLET BY MOUTH DAILY. 07/16/24   Romelle Booty, MD  atorvastatin  (LIPITOR) 40 MG tablet TAKE 1 TABLET (40 MG TOTAL) BY MOUTH DAILY. 08/15/24   Romelle Booty, MD  B Complex-C-Folic Acid  (DIALYVITE  800) 0.8 MG TABS Take 1 tablet by mouth daily. 10/09/18   [provider]  blood glucose meter kit  and supplies KIT Dispense based on patient and insurance preference. Use up to four times daily as directed. 08/12/22   Romelle Booty, MD  blood glucose meter kit and supplies Dispense based on patient and insurance preference. Use up to four times daily as directed. (FOR ICD-10 E10.9, E11.9). 11/21/20   Henriette Mora, MD  Blood Pressure Monitoring (ADULT BLOOD PRESSURE CUFF LG) KIT 1 each by Does not apply route daily. 04/27/22   Mahoney, Caitlin, MD  carvedilol  (COREG ) 6.25 MG tablet Take 6.25 mg by mouth 2 (two) times daily with a meal.    [provider]  Doxercalciferol  (HECTOROL  IV) Doxercalciferol  (Hectorol ) 02/01/24 01/30/25  [provider]  EPINEPHrine  0.3 mg/0.3 mL IJ SOAJ injection Inject 0.3 mg into the muscle as needed for anaphylaxis. 10/02/23   Franklyn Sid SAILOR, MD  ferric citrate  (AURYXIA ) 1 GM 210 MG(Fe) tablet  05/22/19   [provider]  gabapentin  (NEURONTIN ) 100 MG capsule TAKE 2 CAPSULES (200 MG TOTAL) BY MOUTH 3 (THREE) TIMES DAILY. 10/08/24   Romelle Booty, MD  glucose blood (ACCU-CHEK GUIDE) test strip User to check blood sugar up to 4x per day e11.9 10/07/22   Romelle Booty, MD  hydrALAZINE  (APRESOLINE ) 10 MG tablet Take 1 tablet (10 mg total) by mouth 3 (three) times daily. 03/04/24   Romelle Booty, MD  Insulin  Pen Needle (EASY TOUCH PEN NEEDLES) 32G X 4 MM MISC 1 each by Does not apply route 3 (three) times daily. 08/10/18   Rumball, Alison M, DO  Lancet Devices [facility] DELICA PLUS LANCING) MISC CHECK BLOOD SUGARS FOURX DAILY 12/03/20   Henriette Mora, MD  Lancets [facility] DELICA PLUS [place]) MISC USE TO TEST BLOOD SUGAR AS DIRECTED 12/11/21   Mahoney, Caitlin, MD  LOPERAMIDE HCL PO Take by mouth. 01/02/24 12/31/24  [provider]  LOPERAMIDE HCL PO Take by mouth. 01/20/24 01/16/25  [provider]  Na Sulfate-K Sulfate-Mg Sulf (SUPREP BOWEL PREP KIT) 17.5-3.13-1.6 GM/177ML SOLN Take 1 kit by mouth as directed. 07/05/23   Kerman Vina HERO, NP  omeprazole  (PRILOSEC) 40 MG capsule TAKE 1 CAPSULE (40 MG TOTAL) BY MOUTH DAILY. 10/08/24   Romelle Booty, MD  prednisoLONE acetate (PRED FORTE) 1 % ophthalmic suspension SMARTSIG:1 Drop(s) Left Eye Daily PRN 02/17/24   [provider]  sevelamer carbonate (RENVELA) 800 MG tablet Take 800-1,600 mg by mouth 3 (three) times daily with meals. Take 1,600 mg by mouth three times a day with meals and 800 mg with snacks    [provider]  TYLENOL  500 MG tablet Take 500-1,000 mg by mouth every 6 (six) hours as needed for mild pain (pain score 1-3) or headache.    [provider]  Zoster Vaccine Adjuvanted (SHINGRIX ) injection Administer Shingrix  vaccination now and repeat in two months 11/22/23   Theophilus Pagan, MD  Social History   Socioeconomic History   Marital status: Single    Spouse name: Not on file   Number of children: 3   Years of education: 10   Highest education level: Not on file  Occupational History    Employer: [employer]  Tobacco Use   Smoking status: Never    Passive exposure: Never   Smokeless tobacco: Never  Vaping Use   Vaping status: Never Used  Substance and Sexual Activity   Alcohol use: Yes    Comment: h/o alcohol abuse 4 years ago (07/21/15)   Drug use: Yes    Types: Cocaine    Comment:  last used cocaine 4 years ago (07/21/15)   Sexual activity: Yes    Birth control/protection: Surgical  Other Topics Concern   Not on file  Social History Narrative   Patient lives with her boyfriend in [place].    Patient enjoys watching TV, and spending time with her children and grandchildren.    Patient is legally blind and is in a wheel chair. Patient relies on [employer] transportation.    Social Drivers of Corporate Investment Banker Strain: Low Risk  (08/21/2024)   Overall Financial Resource Strain (CARDIA)    Difficulty of Paying Living Expenses: Not hard at all  Food Insecurity: No Food Insecurity (08/21/2024)   Hunger Vital Sign     Worried About Running Out of Food in the Last Year: Never true    Ran Out of Food in the Last Year: Never true  Transportation Needs: Unknown (07/25/2024)   PRAPARE - Administrator, Civil Service (Medical): No    Lack of Transportation (Non-Medical): Not on file  Physical Activity: Inactive (09/27/2023)   Exercise Vital Sign    Days of Exercise per Week: 0 days    Minutes of Exercise per Session: 0 min  Stress: No Stress Concern Present (08/21/2024)   [employer] of Occupational Health - Occupational Stress Questionnaire    Feeling of Stress: Not at all  Social Connections: Unknown (09/27/2023)   Social Connection and Isolation Panel    Frequency of Communication with Friends and Family: More than three times a week    Frequency of Social Gatherings with Friends and Family: Once a week    Attends Religious Services: More than 4 times per year    Active Member of [employer] or Organizations: No    Attends Banker Meetings: Never    Marital Status: Not on file  Intimate Partner Violence: Not At Risk (09/27/2023)   Humiliation, Afraid, Rape, and Kick questionnaire    Fear of Current or Ex-Partner: No    Emotionally Abused: No    Physically Abused: No    Sexually Abused: No     Family History  Problem Relation Age of Onset   Diabetes Mother    Alcohol abuse Mother    Diabetes Father    Alzheimer's disease Father    Hypertension Father    Kidney failure Sister    Diabetes Sister    High blood pressure Sister     ROS: Pain and swelling left upper extremity  Physical Examination  Vitals:   10/11/24 0747 10/11/24 0810  BP: (!) 144/132   Pulse: 77   Resp: 12 14  Temp: 98.4 F (36.9 C)   SpO2: 96%    There is no height or weight on file to calculate BMI.  Awake alert and oriented Nonlabored respirations Left upper AV fistula with pulsatility  CBC  Component Value Date/Time   WBC 5.4 01/17/2024 1513   WBC 10.5 10/02/2023 1830   RBC 4.73  01/17/2024 1513   RBC 4.18 10/02/2023 1830   HGB 10.7 (L) 01/17/2024 1513   HCT 36.1 01/17/2024 1513   PLT 157 01/17/2024 1513   MCV 76 (L) 01/17/2024 1513   MCV 73 (L) 06/06/2013 1244   MCH 22.6 (L) 01/17/2024 1513   MCH 24.2 (L) 10/02/2023 1830   MCHC 29.6 (L) 01/17/2024 1513   MCHC 30.1 10/02/2023 1830   RDW 16.7 (H) 01/17/2024 1513   RDW 14.5 06/06/2013 1244   LYMPHSABS 1.8 01/17/2024 1513   LYMPHSABS 1.9 06/06/2013 1244   MONOABS 0.7 10/02/2023 1830   MONOABS 0.5 06/06/2013 1244   EOSABS 0.2 01/17/2024 1513   EOSABS 0.2 06/06/2013 1244   BASOSABS 0.1 01/17/2024 1513   BASOSABS 0.1 06/06/2013 1244    BMET    Component Value Date/Time   NA 140 10/02/2023 1830   NA 138 08/19/2022 1007   NA 139 09/12/2013 0736   K 5.5 (H) 10/02/2023 1830   K 5.2 (H) 11/07/2013 0802   CL 96 (L) 10/02/2023 1830   CL 111 (H) 09/12/2013 0736   CO2 29 10/02/2023 1830   CO2 21 09/12/2013 0736   GLUCOSE 157 (H) 10/02/2023 1830   GLUCOSE 181 (H) 09/12/2013 0736   BUN 63 (H) 10/02/2023 1830   BUN 44 (H) 08/19/2022 1007   BUN 35 (H) 09/12/2013 0736   CREATININE 9.24 (H) 10/02/2023 1830   CREATININE 1.55 (H) 09/12/2013 0736   CREATININE 0.94 09/12/2012 1113   CALCIUM  10.3 10/02/2023 1830   CALCIUM  7.6 (L) 03/31/2016 1101   GFRNONAA 5 (L) 10/02/2023 1830   GFRNONAA 40 (L) 09/12/2013 0736   GFRNONAA 74 09/12/2012 1113   GFRAA 8 (L) 03/20/2020 1223   GFRAA 46 (L) 09/12/2013 0736   GFRAA 85 09/12/2012 1113    COAGS: Lab Results  Component Value Date   INR 1.23 12/20/2018   INR 1.10 08/22/2011      ASSESSMENT/PLAN: This is a 58 y.o. female with end-stage renal disease on dialysis via transposed basilic vein fistula.  She now has swelling and pain in the left upper extremity.  We have discussed her options and she has elected to proceed with fistulogram with possible intervention.  All questions were answered she demonstrates good understanding and consent was signed.  Lotus Santillo C. Sheree,  MD [facility] Office: [phone] Pager: [phone]

## 2024-10-11 NOTE — Telephone Encounter (Signed)
 Received incoming call regarding PET stress results/rec's. Reviewed results and rec's per Dr. Shlomo (from result note). F/u appt made with Monge, NP for 10/18/24 (has HD on Mon/Wed/Fri and relies on transport to get her to appts). Lab orders were NOT ordered as pt will not be able to come in fasting to the 2:45 pm appointment on 10/18/24. She usually has labs drawn either at her PCP or at HD. Pt verbalized understanding of results/rec's, no other concerns for now.  Dr. Shlomo would like the following labs:  FLP, LP(a) and ALT

## 2024-10-12 ENCOUNTER — Encounter (HOSPITAL_COMMUNITY): Payer: Self-pay | Admitting: Vascular Surgery

## 2024-10-15 ENCOUNTER — Other Ambulatory Visit: Payer: Self-pay | Admitting: Family Medicine

## 2024-10-15 DIAGNOSIS — I159 Secondary hypertension, unspecified: Secondary | ICD-10-CM

## 2024-10-16 NOTE — Telephone Encounter (Signed)
 See telephone encounter from 12/22/23

## 2024-10-16 NOTE — Telephone Encounter (Signed)
-----   Message from Wilbert Bihari sent at 10/02/2024 11:17 PM EDT ----- Stress PET CT showed decreased blood flow running down the front of the heart corresponding to the left anterior descending artery.  Heart function also reduced with resting EF 43%.  Findings  consistent with coronary ischemia.  Global myocardial blood flow reserve was highly abnormal as well.  Please have her come in to see an extender this week to get set up for left heart catheterization.  Continue on atorvastatin  and start aspirin 81 mg daily.  Please have her come in fasting the day of  her appointment and check a FLP, LP(a) and ALT ----- Message ----- From: Interface, Rad Results In Sent: 10/02/2024   4:48 PM EDT To: Wilbert JONELLE Bihari, MD

## 2024-10-18 ENCOUNTER — Ambulatory Visit: Attending: Nurse Practitioner | Admitting: Nurse Practitioner

## 2024-10-18 ENCOUNTER — Encounter: Payer: Self-pay | Admitting: Nurse Practitioner

## 2024-10-18 VITALS — BP 124/78 | HR 76 | Ht 67.0 in | Wt 179.0 lb

## 2024-10-18 DIAGNOSIS — I25118 Atherosclerotic heart disease of native coronary artery with other forms of angina pectoris: Secondary | ICD-10-CM | POA: Diagnosis not present

## 2024-10-18 DIAGNOSIS — I10 Essential (primary) hypertension: Secondary | ICD-10-CM

## 2024-10-18 DIAGNOSIS — N186 End stage renal disease: Secondary | ICD-10-CM

## 2024-10-18 DIAGNOSIS — E1122 Type 2 diabetes mellitus with diabetic chronic kidney disease: Secondary | ICD-10-CM | POA: Diagnosis not present

## 2024-10-18 DIAGNOSIS — E785 Hyperlipidemia, unspecified: Secondary | ICD-10-CM

## 2024-10-18 DIAGNOSIS — Z992 Dependence on renal dialysis: Secondary | ICD-10-CM

## 2024-10-18 NOTE — H&P (View-Only) (Signed)
 Office Visit    Patient Name: [name] Date of Encounter: 10/18/2024  Primary Care Provider:  Romelle Booty, MD Primary Cardiologist:  Wilbert Bihari, MD  Chief Complaint    58 year old female with a history of CAD, hypertension, hyperlipidemia, type 2 diabetes, ESRD on HD, prior alcohol dependency, and blindness in left eye who presents for follow-up related to CAD.   Past Medical History    Past Medical History:  Diagnosis Date   Alcohol dependency (HCC)    Anemia    Appendicitis 12/06/2018   Chronic kidney disease    Diabetes mellitus    type 2   Diabetes mellitus type 2, uncontrolled    End stage renal disease (HCC) 05/05/2016   Starting 04/2016, LUE fistula   Enterococcal bacteremia    Hypertension    Legally blind in left eye, as defined in [place]     Liver abscess 12/20/2018   Necrotizing fasciitis (HCC)    PONV (postoperative nausea and vomiting)    Pulmonary HTN (HCC)    mild with PASP on echo 07/2024   Sepsis without acute organ dysfunction (HCC)    Type 2 diabetes mellitus with diabetic chronic kidney disease (HCC) 05/05/2016   Past Surgical History:  Procedure Laterality Date   A/V FISTULAGRAM N/A 02/09/2024   Procedure: A/V Fistulagram;  Surgeon: Melia Lynwood ORN, MD;  [facility]: [facility];  Service: Cardiovascular;  Laterality: N/A;   A/V FISTULAGRAM Left 10/11/2024   Procedure: A/V Fistulagram;  Surgeon: Sheree Penne Bruckner, MD;  Location: [facility];  Service: Cardiovascular;  Laterality: Left;   AMPUTATION     right first and left middle finger amputation 2/2 OM 08/2011 by Dr. Camella   AMPUTATION  01/18/2012   Procedure: AMPUTATION DIGIT;  Surgeon: Franky JONELLE Curia, MD;  Location: [facility];  Service: Orthopedics;  Laterality: Left;  revision amputation left thumb   AMPUTATION  08/25/2012   Procedure: AMPUTATION BELOW KNEE;  Surgeon: Jerona LULLA Sage, MD;  Location: [facility];  Service: Orthopedics;  Laterality: Right;  Right  Below Knee Amputation   APPENDECTOMY     BASCILIC VEIN TRANSPOSITION Left 07/22/2015   Procedure: BASILIC VEIN TRANSPOSITION ;  Surgeon: Carlin FORBES Haddock, MD;  Location: [facility];  Service: Vascular;  Laterality: Left;   CATARACT EXTRACTION     right    CESAREAN SECTION  1986   CESAREAN SECTION  1985   CESAREAN SECTION  1991   COLONOSCOPY WITH PROPOFOL  N/A 08/25/2023   Procedure: COLONOSCOPY WITH PROPOFOL ;  Surgeon: Aneita Gwendlyn DASEN, MD;  Location: [facility];  Service: Gastroenterology;  Laterality: N/A;   I & D EXTREMITY  12/24/2011   Procedure: IRRIGATION AND DEBRIDEMENT EXTREMITY;  Surgeon: Franky JONELLE Curia, MD;  Location: [facility];  Service: Orthopedics;  Laterality: Left;  I&Dleft thumb with partial amputation   I & D EXTREMITY  12/26/2011   Procedure: IRRIGATION AND DEBRIDEMENT EXTREMITY;  Surgeon: Franky JONELLE Curia, MD;  Location: [facility];  Service: Orthopedics;  Laterality: Left;   I & D EXTREMITY  12/23/2011   Procedure: IRRIGATION AND DEBRIDEMENT EXTREMITY;  Surgeon: Franky JONELLE Curia, MD;  [facility]: [facility];  Service: Orthopedics;  Laterality: Left;  I&D Left thumb, hand and arm   I&D left thumb  12/23/2011   IR RADIOLOGIST EVAL & MGMT  01/04/2019   IR RADIOLOGIST EVAL & MGMT  01/25/2019   LAPAROSCOPIC APPENDECTOMY N/A 12/06/2018   Procedure: APPENDECTOMY LAPAROSCOPIC;  Surgeon: Stevie Herlene Righter, MD;  Location: [facility];  Service: General;  Laterality: N/A;   POLYPECTOMY  08/25/2023   Procedure: POLYPECTOMY;  Surgeon: Aneita Gwendlyn DASEN, MD;  Location: [facility];  Service: Gastroenterology;;   TUBAL LIGATION      Allergies  Allergies  Allergen Reactions   Shrimp Extract Nausea And Vomiting, Swelling and Other (See Comments)    Patient ended up in the ED after eating this- left eye is puffy     Labs/Other Studies Reviewed    The following studies were reviewed today:  Cardiac Studies & Procedures    ______________________________________________________________________________________________   STRESS TESTS  NM PET CT CARDIAC PERFUSION MULTI W/ABSOLUTE BLOODFLOW 10/02/2024  Narrative   Findings are consistent with LAD ischemia  Technically challenging study; though flow reserve with a perfusion defect is a high risk feature, this can be less accurate in the ESRD population.  Ultimately, with decreased LVEF and the above, the study is high risk.   LV perfusion is abnormal. Defect 1: There is a medium defect with moderate reduction in uptake present in the apical to mid anterior location(s) that is reversible. There is normal wall motion in the defect area. Consistent with ischemia.   Rest left ventricular function is abnormal. Rest EF: 43%. Stress left ventricular function is normal. Stress EF: 53%. End diastolic cavity size is mildly enlarged. End systolic cavity size is normal.   Myocardial blood flow was computed to be 1.70ml/g/min at rest and 1.13ml/g/min at stress. Global myocardial blood flow reserve was 1.21 and was highly abnormal.   Coronary calcium  was present on the attenuation correction CT images. Moderate coronary calcifications were present. Coronary calcifications were present in the left anterior descending artery, left circumflex artery and right coronary artery distribution(s). Aortic atherosclerosis.  Dilation of the pulmonary artery on non contrast imaging.   Electronically Signed  By: Stanly Leavens M.D.  CLINICAL DATA:  This over-read does not include interpretation of cardiac or coronary anatomy or pathology. No interpretation the PET data set. The cardiac PET-CT interpretation by the cardiologist is attached.  COMPARISON:  None Available.  FINDINGS: Limited view of the lung parenchyma demonstrates no suspicious nodularity. Airways are normal.  Limited view of the mediastinum demonstrates no adenopathy. Esophagus normal.  Limited view of the upper  abdomen unremarkable.  Limited view of the skeleton and chest wall is unremarkable.  IMPRESSION: No significant extracardiac findings.   Electronically Signed By: Jackquline Boxer M.D. On: 10/02/2024 16:45   ECHOCARDIOGRAM  ECHOCARDIOGRAM COMPLETE 08/02/2024  Narrative ECHOCARDIOGRAM REPORT    Patient Name:   ROOSEVELT BISHER Date of Exam: 08/02/2024 Medical Rec #:  [id number]       Height:       67.0 in Accession #:    [id number]      Weight:       179.8 lb Date of Birth:  1965/12/18      BSA:          1.933 m Patient Age:    57 years        BP:           186/89 mmHg Patient Gender: F               HR:           76 bpm. Exam Location:  [address]  Procedure: 2D Echo, 3D Echo and Strain Analysis (Both Spectral and Color Flow Doppler were utilized during procedure).  Indications:    R07.9* Chest pain, unspecified  History:  Patient has prior history of Echocardiogram examinations, most recent 01/08/2012. Risk Factors:Hypertension, Diabetes and Dyslipidemia. ESRD. Legally blind. Dizziness.  Sonographer:    Jon Hacker RCS Referring Phys: [phone] TRACI R TURNER  IMPRESSIONS   1. Left ventricular ejection fraction, by estimation, is 60 to 65%. Left ventricular ejection fraction by 3D volume is 62 %. The left ventricle has normal function. The left ventricle has no regional wall motion abnormalities. Left ventricular diastolic parameters are indeterminate. Elevated left atrial pressure. The average left ventricular global longitudinal strain is -19.4 %. The global longitudinal strain is normal. 2. Right ventricular systolic function is normal. The right ventricular size is normal. There is mildly elevated pulmonary artery systolic pressure. The estimated right ventricular systolic pressure is 43.2 mmHg. 3. The mitral valve is normal in structure. Trivial mitral valve regurgitation. No evidence of mitral stenosis. 4. The aortic valve is tricuspid. Aortic valve  regurgitation is not visualized. Aortic valve sclerosis/calcification is present, without any evidence of aortic stenosis. 5. The inferior vena cava is normal in size with greater than 50% respiratory variability, suggesting right atrial pressure of 3 mmHg.  FINDINGS Left Ventricle: Left ventricular ejection fraction, by estimation, is 60 to 65%. Left ventricular ejection fraction by 3D volume is 62 %. The left ventricle has normal function. The left ventricle has no regional wall motion abnormalities. The average left ventricular global longitudinal strain is -19.4 %. Strain was performed and the global longitudinal strain is normal. The left ventricular internal cavity size was normal in size. There is no left ventricular hypertrophy. Left ventricular diastolic parameters are indeterminate. Elevated left atrial pressure.  Right Ventricle: The right ventricular size is normal. No increase in right ventricular wall thickness. Right ventricular systolic function is normal. There is mildly elevated pulmonary artery systolic pressure. The tricuspid regurgitant velocity is 3.17 m/s, and with an assumed right atrial pressure of 3 mmHg, the estimated right ventricular systolic pressure is 43.2 mmHg.  Left Atrium: Left atrial size was normal in size.  Right Atrium: Right atrial size was normal in size.  Pericardium: There is no evidence of pericardial effusion.  Mitral Valve: The mitral valve is normal in structure. Trivial mitral valve regurgitation. No evidence of mitral valve stenosis.  Tricuspid Valve: The tricuspid valve is normal in structure. Tricuspid valve regurgitation is mild . No evidence of tricuspid stenosis.  Aortic Valve: The aortic valve is tricuspid. Aortic valve regurgitation is not visualized. Aortic valve sclerosis/calcification is present, without any evidence of aortic stenosis.  Pulmonic Valve: The pulmonic valve was normal in structure. Pulmonic valve regurgitation is  trivial. No evidence of pulmonic stenosis.  Aorta: The aortic root is normal in size and structure.  Venous: The inferior vena cava is normal in size with greater than 50% respiratory variability, suggesting right atrial pressure of 3 mmHg.  IAS/Shunts: No atrial level shunt detected by color flow Doppler.  Additional Comments: 3D was performed not requiring image post processing on an independent workstation and was normal.   LEFT VENTRICLE PLAX 2D LVIDd:         4.79 cm         Diastology LVIDs:         3.38 cm         LV e' medial:    5.55 cm/s LV PW:         1.11 cm         LV E/e' medial:  20.7 LV IVS:        0.96  cm         LV e' lateral:   11.40 cm/s LVOT diam:     2.00 cm         LV E/e' lateral: 10.1 LV SV:         83 LV SV Index:   43              2D Longitudinal LVOT Area:     3.14 cm        Strain 2D Strain GLS   -22.4 % (A4C): 2D Strain GLS   -18.0 % (A3C): 2D Strain GLS   -17.7 % (A2C): 2D Strain GLS   -19.4 % Avg:  3D Volume EF LV 3D EF:    Left ventricul ar ejection fraction by 3D volume is 62 %.  3D Volume EF: 3D EF:        62 % LV EDV:       202 ml LV ESV:       78 ml LV SV:        125 ml  RIGHT VENTRICLE RV Basal diam:  3.14 cm RV S prime:     10.30 cm/s TAPSE (M-mode): 2.4 cm RVSP:           43.2 mmHg  LEFT ATRIUM             Index        RIGHT ATRIUM           Index LA diam:        3.20 cm 1.66 cm/m   RA Pressure: 3.00 mmHg LA Vol (A2C):   50.9 ml 26.34 ml/m  RA Area:     18.00 cm LA Vol (A4C):   44.4 ml 22.97 ml/m  RA Volume:   49.80 ml  25.77 ml/m LA Biplane Vol: 50.9 ml 26.34 ml/m AORTIC VALVE LVOT Vmax:   96.60 cm/s LVOT Vmean:  65.400 cm/s LVOT VTI:    0.264 m  AORTA Ao Root diam: 2.60 cm Ao Asc diam:  3.20 cm  MITRAL VALVE                TRICUSPID VALVE MV Area (PHT): 4.83 cm     TR Peak grad:   40.2 mmHg MV Decel Time: 157 msec     TR Vmax:        317.00 cm/s MV E velocity: 115.00 cm/s  Estimated RAP:  3.00  mmHg MV A velocity: 107.00 cm/s  RVSP:           43.2 mmHg MV E/A ratio:  1.07 SHUNTS Systemic VTI:  0.26 m Systemic Diam: 2.00 cm  Wilbert Bihari MD Electronically signed by Wilbert Bihari MD Signature Date/Time: 08/02/2024/4:21:46 PM    Final          ______________________________________________________________________________________________     Recent Labs: 10/18/2024: ALT 6; BUN 50; Creatinine, Ser 8.08; Hemoglobin 11.4; Platelets 187; Potassium 6.1; Sodium 137  Recent Lipid Panel    Component Value Date/Time   CHOL 106 11/22/2023 1453   TRIG 64 11/22/2023 1453   HDL 51 11/22/2023 1453   CHOLHDL 2.1 11/22/2023 1453   CHOLHDL 3.2 08/25/2012 0530   VLDL 33 08/25/2012 0530   LDLCALC 41 11/22/2023 1453    History of Present Illness    58 year old female with the above past medical history including CAD, hypertension, hyperlipidemia, type 2 diabetes, ESRD on HD, prior alcohol dependency, and blindness in left eye.  She was referred to cardiology in the setting of  chest discomfort. She was last seen in office on 05/31/2024 and reported chest pressure at rest with radiation to her left arm, no associated symptoms.  Echocardiogram in 07/2024 showed EF 65%, normal LV function, no RWMA, normal RV systolic function, mildly elevated PASP, no significant valvular abnormalities. Cardiac PET stress in 09/2024 showed medium defect with moderate reduction in uptake present in the apical to mid anterior locations, reversible, consistent with ischemia, EF 43% moderate coronary calcification in the LAD, LCx, RCA.  Follow-up cardiac catheterization was recommended as well as updated fasting lipid panel, LFTs, LP(a).  She presents today for follow-up. Since her last visit she has been stable from a cardiac standpoint.  She denies any symptoms concerning for angina.  Overall, she reports feeling well.    Home Medications    Current Outpatient Medications  Medication Sig Dispense Refill    amlodipine -olmesartan  (AZOR ) 10-20 MG tablet TAKE 1 TABLET BY MOUTH DAILY. 90 tablet 0   aspirin EC 81 MG tablet Take 1 tablet (81 mg total) by mouth daily. Swallow whole.     atorvastatin  (LIPITOR) 40 MG tablet TAKE 1 TABLET (40 MG TOTAL) BY MOUTH DAILY. 90 tablet 0   B Complex-C-Folic Acid  (DIALYVITE  800) 0.8 MG TABS Take 1 tablet by mouth daily.  11   Blood Pressure Monitoring (ADULT BLOOD PRESSURE CUFF LG) KIT 1 each by Does not apply route daily. 1 kit 0   carvedilol  (COREG ) 6.25 MG tablet Take 6.25 mg by mouth 2 (two) times daily with a meal.     EPINEPHrine  0.3 mg/0.3 mL IJ SOAJ injection Inject 0.3 mg into the muscle as needed for anaphylaxis. 1 each 0   ferric citrate  (AURYXIA ) 1 GM 210 MG(Fe) tablet      gabapentin  (NEURONTIN ) 100 MG capsule TAKE 2 CAPSULES (200 MG TOTAL) BY MOUTH 3 (THREE) TIMES DAILY. 540 capsule 0   glucose blood (ACCU-CHEK GUIDE) test strip User to check blood sugar up to 4x per day e11.9 100 each 12   hydrALAZINE  (APRESOLINE ) 10 MG tablet Take 1 tablet (10 mg total) by mouth 3 (three) times daily. 270 tablet 0   LOPERAMIDE HCL PO Take by mouth as needed.     omeprazole  (PRILOSEC) 40 MG capsule TAKE 1 CAPSULE (40 MG TOTAL) BY MOUTH DAILY. 90 capsule 0   prednisoLONE acetate (PRED FORTE) 1 % ophthalmic suspension SMARTSIG:1 Drop(s) Left Eye Daily PRN     sevelamer carbonate (RENVELA) 800 MG tablet Take 800-1,600 mg by mouth 3 (three) times daily with meals. Take 1,600 mg by mouth three times a day with meals and 800 mg with snacks     TYLENOL  500 MG tablet Take 500-1,000 mg by mouth every 6 (six) hours as needed for mild pain (pain score 1-3) or headache.     Zoster Vaccine Adjuvanted (SHINGRIX ) injection Administer Shingrix  vaccination now and repeat in two months 1 each 1   No current facility-administered medications for this visit.     Review of Systems    She denies chest pain, palpitations, dyspnea, pnd, orthopnea, n, v, dizziness, syncope, edema, weight  gain, or early satiety. All other systems reviewed and are otherwise negative except as noted above.   Physical Exam    VS:  BP 124/78   Pulse 76   Ht 5' 7 (1.702 m)   Wt 179 lb (81.2 kg)   LMP 03/07/2019   SpO2 95%   BMI 28.04 kg/m   STOP-Bang Score:  3      GEN:  Well nourished, well developed, in no acute distress. HEENT: normal. Neck: Supple, no JVD, carotid bruits, or masses. Cardiac: RRR, no murmurs, rubs, or gallops. No clubbing, cyanosis, edema.  Radials/DP/PT 2+ and equal bilaterally.  Respiratory:  Respirations regular and unlabored, clear to auscultation bilaterally. GI: Soft, nontender, nondistended, BS + x 4. MS: no deformity or atrophy. Skin: warm and dry, no rash. Neuro:  Strength and sensation are intact. Psych: Normal affect.  Accessory Clinical Findings    ECG personally reviewed by me today - EKG Interpretation Date/Time:  Thursday October 18 2024 14:19:06 EST Ventricular Rate:  76 PR Interval:  198 QRS Duration:  68 QT Interval:  414 QTC Calculation: 465 R Axis:   -3  Text Interpretation: Normal sinus rhythm Septal infarct (cited on or before 31-May-2024) When compared with ECG of 31-May-2024 09:41, No significant change was found Confirmed by Daneen Perkins ([id number]) on 10/18/2024 2:25:04 PM  - no acute changes.   Lab Results  Component Value Date   WBC 4.6 10/18/2024   HGB 11.4 10/18/2024   HCT 39.6 10/18/2024   MCV 78 (L) 10/18/2024   PLT 187 10/18/2024   Lab Results  Component Value Date   CREATININE 8.08 (H) 10/18/2024   BUN 50 (H) 10/18/2024   NA 137 10/18/2024   K 6.1 (HH) 10/18/2024   CL 91 (L) 10/18/2024   CO2 26 10/18/2024   Lab Results  Component Value Date   ALT 6 10/18/2024   AST 17 10/18/2024   ALKPHOS 84 10/18/2024   BILITOT 0.4 10/18/2024   Lab Results  Component Value Date   CHOL 106 11/22/2023   HDL 51 11/22/2023   LDLCALC 41 11/22/2023   TRIG 64 11/22/2023   CHOLHDL 2.1 11/22/2023    Lab Results  Component Value  Date   HGBA1C 5.8 08/28/2024    Assessment & Plan    1. CAD: Cardiac PET stress in 09/2024 showed medium defect with moderate reduction in uptake present in the apical to mid anterior locations, reversible, consistent with ischemia, EF 43% moderate coronary calcification in the LAD, LCx, RCA.  Stable with no anginal symptoms.  Once again reviewed recommendations for LHC with Dr. Shlomo, who recommends proceeding with LHC given abnormal stress test.  Patient is agreeable to proceed.  Will check CBC, CMET today.  LHC scheduled for 10/25/2024 with Dr. Mady. Will notify her nephrologist, Dr. Dalene, as well as [facility] of plans for upcoming cath.  Of note, she is no longer on insulin .  Continue aspirin, carvedilol , hydralazine , amlodipine -losartan , Lipitor.  Informed Consent   Shared Decision Making/Informed Consent The risks [stroke (1 in 1000), death (1 in 1000), kidney failure [usually temporary] (1 in 500), bleeding (1 in 200), allergic reaction [possibly serious] (1 in 200)], benefits (diagnostic support and management of coronary artery disease) and alternatives of a cardiac catheterization were discussed in detail with Ms. Sardo and she is willing to proceed.    2. Hypertension: BP well controlled. Continue current antihypertensive regimen.   3. Hyperlipidemia: LDL was 41 in 11/2023.  She will ultimately require updated fasting lipids, LP(a), will defer she is not fasting at this time. Continue Lipitor.  4. Type 2 diabetes: A1c was 5.8 in 08/2024.  Monitored and managed per PCP.  5. ESRD on HD: On HD MWF, Followed by nephrology.   6. Disposition: Follow-up 2 weeks post cath.      Perkins JAYSON Daneen, NP 10/21/2024, 1:38 PM

## 2024-10-18 NOTE — Patient Instructions (Addendum)
 Medication Instructions:  Your physician recommends that you continue on your current medications as directed. Please refer to the Current Medication list given to you today.  *If you need a refill on your cardiac medications before your next appointment, please call your pharmacy*  Lab Work: CBC, CMET today  Testing/Procedures: Your physician has requested that you have a left heart cardiac catheterization. Cardiac catheterization is used to diagnose and/or treat various heart conditions. Doctors may recommend this procedure for a number of different reasons. The most common reason is to evaluate chest pain. Chest pain can be a symptom of coronary artery disease (CAD), and cardiac catheterization can show whether plaque is narrowing or blocking your heart's arteries. This procedure is also used to evaluate the valves, as well as measure the blood flow and oxygen levels in different parts of your heart. For further information please visit [email or web address] Please follow instruction sheet, as given. Scheduled for 10/25/24  Follow-Up: At [facility], you and your health needs are our priority.  As part of our continuing mission to provide you with exceptional heart care, our providers are all part of one team.  This team includes your primary Cardiologist (physician) and Advanced Practice Providers or APPs (Physician Assistants and Nurse Practitioners) who all work together to provide you with the care you need, when you need it.  Your next appointment:    2 weeks post cath  Provider:   Dr. Shlomo or Damien Braver, NP          We recommend signing up for the patient portal called MyChart.  Sign up information is provided on this After Visit Summary.  MyChart is used to connect with patients for Virtual Visits (Telemedicine).  Patients are able to view lab/test results, encounter notes, upcoming appointments, etc.  Non-urgent messages can be sent to your provider as well.   To learn  more about what you can do with MyChart, go to [email or web address].   Other Instructions   [place] [facility] A DEPT OF [place]. [facility] AT [address] A DEPT OF THE [place]. [facility] [address] [place] [address] Dept: [phone] Loc: [phone]  BRITZY GRAUL  10/18/2024  You are scheduled for a Cardiac Catheterization on Thursday, November 20 with Dr. Lonni End.  1. Please arrive at the [facility] (Main Entrance A) at [facility]: [address] [place], [place] [address] at 7:00 AM (This time is 2 hour(s) before your procedure to ensure your preparation).   Free valet parking service is available. You will check in at ADMITTING. The support person will be asked to wait in the waiting room.  It is OK to have someone drop you off and come back when you are ready to be discharged.    Special note: Every effort is made to have your procedure done on time. Please understand that emergencies sometimes delay scheduled procedures.  2. Diet: Nothing to eat after midnight.   3. Hydration: On November 20, you may drink approved liquids (see below) until 2 hours before the procedure time.       List of approved liquids water, clear juice, clear tea, black coffee, fruit juices, non-citric and without pulp, carbonated beverages, Gatorade, Kool -Aid, plain Jello-O and plain ice popsicles.  4. Labs: You will need to have blood drawn on Thursday, November 13 at [facility] (1st Floor), [address] [place], [place]  [address]. You do not need to be fasting.  5. Medication instructions in preparation for your procedure:   Contrast Allergy: No   On the morning of your procedure, take your Aspirin 81 mg and any morning medicines NOT listed above.  You may use sips of water.  6. Plan to go home the same day, you will only stay overnight if medically necessary. 7. Bring a current list  of your medications and current insurance cards. 8. You MUST have a responsible person to drive you home. 9. Someone MUST be with you the first 24 hours after you arrive home or your discharge will be delayed. 10. Please wear clothes that are easy to get on and off and wear slip-on shoes.  Thank you for allowing us  to care for you!   -- [facility]

## 2024-10-18 NOTE — Progress Notes (Signed)
 Office Visit    Patient Name: [name] Date of Encounter: 10/18/2024  Primary Care Provider:  Romelle Booty, MD Primary Cardiologist:  Wilbert Bihari, MD  Chief Complaint    58 year old female with a history of CAD, hypertension, hyperlipidemia, type 2 diabetes, ESRD on HD, prior alcohol dependency, and blindness in left eye who presents for follow-up related to CAD.   Past Medical History    Past Medical History:  Diagnosis Date   Alcohol dependency (HCC)    Anemia    Appendicitis 12/06/2018   Chronic kidney disease    Diabetes mellitus    type 2   Diabetes mellitus type 2, uncontrolled    End stage renal disease (HCC) 05/05/2016   Starting 04/2016, LUE fistula   Enterococcal bacteremia    Hypertension    Legally blind in left eye, as defined in [place]     Liver abscess 12/20/2018   Necrotizing fasciitis (HCC)    PONV (postoperative nausea and vomiting)    Pulmonary HTN (HCC)    mild with PASP on echo 07/2024   Sepsis without acute organ dysfunction (HCC)    Type 2 diabetes mellitus with diabetic chronic kidney disease (HCC) 05/05/2016   Past Surgical History:  Procedure Laterality Date   A/V FISTULAGRAM N/A 02/09/2024   Procedure: A/V Fistulagram;  Surgeon: Melia Lynwood ORN, MD;  Location: [facility];  Service: Cardiovascular;  Laterality: N/A;   A/V FISTULAGRAM Left 10/11/2024   Procedure: A/V Fistulagram;  Surgeon: Sheree Penne Bruckner, MD;  Location: [facility];  Service: Cardiovascular;  Laterality: Left;   AMPUTATION     right first and left middle finger amputation 2/2 OM 08/2011 by Dr. Camella   AMPUTATION  01/18/2012   Procedure: AMPUTATION DIGIT;  Surgeon: Franky JONELLE Curia, MD;  Location: [facility];  Service: Orthopedics;  Laterality: Left;  revision amputation left thumb   AMPUTATION  08/25/2012   Procedure: AMPUTATION BELOW KNEE;  Surgeon: Jerona LULLA Sage, MD;  Location: [facility];  Service: Orthopedics;  Laterality: Right;  Right  Below Knee Amputation   APPENDECTOMY     BASCILIC VEIN TRANSPOSITION Left 07/22/2015   Procedure: BASILIC VEIN TRANSPOSITION ;  Surgeon: Carlin FORBES Haddock, MD;  Location: [facility];  Service: Vascular;  Laterality: Left;   CATARACT EXTRACTION     right    CESAREAN SECTION  1986   CESAREAN SECTION  1985   CESAREAN SECTION  1991   COLONOSCOPY WITH PROPOFOL  N/A 08/25/2023   Procedure: COLONOSCOPY WITH PROPOFOL ;  Surgeon: Aneita Gwendlyn DASEN, MD;  Location: [facility];  Service: Gastroenterology;  Laterality: N/A;   I & D EXTREMITY  12/24/2011   Procedure: IRRIGATION AND DEBRIDEMENT EXTREMITY;  Surgeon: Franky JONELLE Curia, MD;  Location: [facility];  Service: Orthopedics;  Laterality: Left;  I&Dleft thumb with partial amputation   I & D EXTREMITY  12/26/2011   Procedure: IRRIGATION AND DEBRIDEMENT EXTREMITY;  Surgeon: Franky JONELLE Curia, MD;  Location: [facility];  Service: Orthopedics;  Laterality: Left;   I & D EXTREMITY  12/23/2011   Procedure: IRRIGATION AND DEBRIDEMENT EXTREMITY;  Surgeon: Franky JONELLE Curia, MD;  Location: [facility];  Service: Orthopedics;  Laterality: Left;  I&D Left thumb, hand and arm   I&D left thumb  12/23/2011   IR RADIOLOGIST EVAL & MGMT  01/04/2019   IR RADIOLOGIST EVAL & MGMT  01/25/2019   LAPAROSCOPIC APPENDECTOMY N/A 12/06/2018   Procedure: APPENDECTOMY LAPAROSCOPIC;  Surgeon: Stevie Herlene Righter, MD;  Location: [facility];  Service: General;  Laterality: N/A;   POLYPECTOMY  08/25/2023   Procedure: POLYPECTOMY;  Surgeon: Aneita Gwendlyn DASEN, MD;  Location: [facility];  Service: Gastroenterology;;   TUBAL LIGATION      Allergies  Allergies  Allergen Reactions   Shrimp Extract Nausea And Vomiting, Swelling and Other (See Comments)    Patient ended up in the ED after eating this- left eye is puffy     Labs/Other Studies Reviewed    The following studies were reviewed today:  Cardiac Studies & Procedures    ______________________________________________________________________________________________   STRESS TESTS  NM PET CT CARDIAC PERFUSION MULTI W/ABSOLUTE BLOODFLOW 10/02/2024  Narrative   Findings are consistent with LAD ischemia  Technically challenging study; though flow reserve with a perfusion defect is a high risk feature, this can be less accurate in the ESRD population.  Ultimately, with decreased LVEF and the above, the study is high risk.   LV perfusion is abnormal. Defect 1: There is a medium defect with moderate reduction in uptake present in the apical to mid anterior location(s) that is reversible. There is normal wall motion in the defect area. Consistent with ischemia.   Rest left ventricular function is abnormal. Rest EF: 43%. Stress left ventricular function is normal. Stress EF: 53%. End diastolic cavity size is mildly enlarged. End systolic cavity size is normal.   Myocardial blood flow was computed to be 1.70ml/g/min at rest and 1.13ml/g/min at stress. Global myocardial blood flow reserve was 1.21 and was highly abnormal.   Coronary calcium  was present on the attenuation correction CT images. Moderate coronary calcifications were present. Coronary calcifications were present in the left anterior descending artery, left circumflex artery and right coronary artery distribution(s). Aortic atherosclerosis.  Dilation of the pulmonary artery on non contrast imaging.   Electronically Signed  By: Stanly Leavens M.D.  CLINICAL DATA:  This over-read does not include interpretation of cardiac or coronary anatomy or pathology. No interpretation the PET data set. The cardiac PET-CT interpretation by the cardiologist is attached.  COMPARISON:  None Available.  FINDINGS: Limited view of the lung parenchyma demonstrates no suspicious nodularity. Airways are normal.  Limited view of the mediastinum demonstrates no adenopathy. Esophagus normal.  Limited view of the upper  abdomen unremarkable.  Limited view of the skeleton and chest wall is unremarkable.  IMPRESSION: No significant extracardiac findings.   Electronically Signed By: Jackquline Boxer M.D. On: 10/02/2024 16:45   ECHOCARDIOGRAM  ECHOCARDIOGRAM COMPLETE 08/02/2024  Narrative ECHOCARDIOGRAM REPORT    Patient Name:   [name] Date of Exam: 08/02/2024 Medical Rec #:  [id number]       Height:       67.0 in Accession #:    [id number]      Weight:       179.8 lb Date of Birth:  1965/12/18      BSA:          1.933 m Patient Age:    57 years        BP:           186/89 mmHg Patient Gender: F               HR:           76 bpm. Exam Location:  [address]  Procedure: 2D Echo, 3D Echo and Strain Analysis (Both Spectral and Color Flow Doppler were utilized during procedure).  Indications:    R07.9* Chest pain, unspecified  History:  Patient has prior history of Echocardiogram examinations, most recent 01/08/2012. Risk Factors:Hypertension, Diabetes and Dyslipidemia. ESRD. Legally blind. Dizziness.  Sonographer:    Jon Hacker RCS Referring Phys: [phone] TRACI R TURNER  IMPRESSIONS   1. Left ventricular ejection fraction, by estimation, is 60 to 65%. Left ventricular ejection fraction by 3D volume is 62 %. The left ventricle has normal function. The left ventricle has no regional wall motion abnormalities. Left ventricular diastolic parameters are indeterminate. Elevated left atrial pressure. The average left ventricular global longitudinal strain is -19.4 %. The global longitudinal strain is normal. 2. Right ventricular systolic function is normal. The right ventricular size is normal. There is mildly elevated pulmonary artery systolic pressure. The estimated right ventricular systolic pressure is 43.2 mmHg. 3. The mitral valve is normal in structure. Trivial mitral valve regurgitation. No evidence of mitral stenosis. 4. The aortic valve is tricuspid. Aortic valve  regurgitation is not visualized. Aortic valve sclerosis/calcification is present, without any evidence of aortic stenosis. 5. The inferior vena cava is normal in size with greater than 50% respiratory variability, suggesting right atrial pressure of 3 mmHg.  FINDINGS Left Ventricle: Left ventricular ejection fraction, by estimation, is 60 to 65%. Left ventricular ejection fraction by 3D volume is 62 %. The left ventricle has normal function. The left ventricle has no regional wall motion abnormalities. The average left ventricular global longitudinal strain is -19.4 %. Strain was performed and the global longitudinal strain is normal. The left ventricular internal cavity size was normal in size. There is no left ventricular hypertrophy. Left ventricular diastolic parameters are indeterminate. Elevated left atrial pressure.  Right Ventricle: The right ventricular size is normal. No increase in right ventricular wall thickness. Right ventricular systolic function is normal. There is mildly elevated pulmonary artery systolic pressure. The tricuspid regurgitant velocity is 3.17 m/s, and with an assumed right atrial pressure of 3 mmHg, the estimated right ventricular systolic pressure is 43.2 mmHg.  Left Atrium: Left atrial size was normal in size.  Right Atrium: Right atrial size was normal in size.  Pericardium: There is no evidence of pericardial effusion.  Mitral Valve: The mitral valve is normal in structure. Trivial mitral valve regurgitation. No evidence of mitral valve stenosis.  Tricuspid Valve: The tricuspid valve is normal in structure. Tricuspid valve regurgitation is mild . No evidence of tricuspid stenosis.  Aortic Valve: The aortic valve is tricuspid. Aortic valve regurgitation is not visualized. Aortic valve sclerosis/calcification is present, without any evidence of aortic stenosis.  Pulmonic Valve: The pulmonic valve was normal in structure. Pulmonic valve regurgitation is  trivial. No evidence of pulmonic stenosis.  Aorta: The aortic root is normal in size and structure.  Venous: The inferior vena cava is normal in size with greater than 50% respiratory variability, suggesting right atrial pressure of 3 mmHg.  IAS/Shunts: No atrial level shunt detected by color flow Doppler.  Additional Comments: 3D was performed not requiring image post processing on an independent workstation and was normal.   LEFT VENTRICLE PLAX 2D LVIDd:         4.79 cm         Diastology LVIDs:         3.38 cm         LV e' medial:    5.55 cm/s LV PW:         1.11 cm         LV E/e' medial:  20.7 LV IVS:        0.96  cm         LV e' lateral:   11.40 cm/s LVOT diam:     2.00 cm         LV E/e' lateral: 10.1 LV SV:         83 LV SV Index:   43              2D Longitudinal LVOT Area:     3.14 cm        Strain 2D Strain GLS   -22.4 % (A4C): 2D Strain GLS   -18.0 % (A3C): 2D Strain GLS   -17.7 % (A2C): 2D Strain GLS   -19.4 % Avg:  3D Volume EF LV 3D EF:    Left ventricul ar ejection fraction by 3D volume is 62 %.  3D Volume EF: 3D EF:        62 % LV EDV:       202 ml LV ESV:       78 ml LV SV:        125 ml  RIGHT VENTRICLE RV Basal diam:  3.14 cm RV S prime:     10.30 cm/s TAPSE (M-mode): 2.4 cm RVSP:           43.2 mmHg  LEFT ATRIUM             Index        RIGHT ATRIUM           Index LA diam:        3.20 cm 1.66 cm/m   RA Pressure: 3.00 mmHg LA Vol (A2C):   50.9 ml 26.34 ml/m  RA Area:     18.00 cm LA Vol (A4C):   44.4 ml 22.97 ml/m  RA Volume:   49.80 ml  25.77 ml/m LA Biplane Vol: 50.9 ml 26.34 ml/m AORTIC VALVE LVOT Vmax:   96.60 cm/s LVOT Vmean:  65.400 cm/s LVOT VTI:    0.264 m  AORTA Ao Root diam: 2.60 cm Ao Asc diam:  3.20 cm  MITRAL VALVE                TRICUSPID VALVE MV Area (PHT): 4.83 cm     TR Peak grad:   40.2 mmHg MV Decel Time: 157 msec     TR Vmax:        317.00 cm/s MV E velocity: 115.00 cm/s  Estimated RAP:  3.00  mmHg MV A velocity: 107.00 cm/s  RVSP:           43.2 mmHg MV E/A ratio:  1.07 SHUNTS Systemic VTI:  0.26 m Systemic Diam: 2.00 cm  Wilbert Bihari MD Electronically signed by Wilbert Bihari MD Signature Date/Time: 08/02/2024/4:21:46 PM    Final          ______________________________________________________________________________________________     Recent Labs: 10/18/2024: ALT 6; BUN 50; Creatinine, Ser 8.08; Hemoglobin 11.4; Platelets 187; Potassium 6.1; Sodium 137  Recent Lipid Panel    Component Value Date/Time   CHOL 106 11/22/2023 1453   TRIG 64 11/22/2023 1453   HDL 51 11/22/2023 1453   CHOLHDL 2.1 11/22/2023 1453   CHOLHDL 3.2 08/25/2012 0530   VLDL 33 08/25/2012 0530   LDLCALC 41 11/22/2023 1453    History of Present Illness    58 year old female with the above past medical history including CAD, hypertension, hyperlipidemia, type 2 diabetes, ESRD on HD, prior alcohol dependency, and blindness in left eye.  She was referred to cardiology in the setting of  chest discomfort. She was last seen in office on 05/31/2024 and reported chest pressure at rest with radiation to her left arm, no associated symptoms.  Echocardiogram in 07/2024 showed EF 65%, normal LV function, no RWMA, normal RV systolic function, mildly elevated PASP, no significant valvular abnormalities. Cardiac PET stress in 09/2024 showed medium defect with moderate reduction in uptake present in the apical to mid anterior locations, reversible, consistent with ischemia, EF 43% moderate coronary calcification in the LAD, LCx, RCA.  Follow-up cardiac catheterization was recommended as well as updated fasting lipid panel, LFTs, LP(a).  She presents today for follow-up. Since her last visit she has been stable from a cardiac standpoint.  She denies any symptoms concerning for angina.  Overall, she reports feeling well.    Home Medications    Current Outpatient Medications  Medication Sig Dispense Refill    amlodipine -olmesartan  (AZOR ) 10-20 MG tablet TAKE 1 TABLET BY MOUTH DAILY. 90 tablet 0   aspirin EC 81 MG tablet Take 1 tablet (81 mg total) by mouth daily. Swallow whole.     atorvastatin  (LIPITOR) 40 MG tablet TAKE 1 TABLET (40 MG TOTAL) BY MOUTH DAILY. 90 tablet 0   B Complex-C-Folic Acid  (DIALYVITE  800) 0.8 MG TABS Take 1 tablet by mouth daily.  11   Blood Pressure Monitoring (ADULT BLOOD PRESSURE CUFF LG) KIT 1 each by Does not apply route daily. 1 kit 0   carvedilol  (COREG ) 6.25 MG tablet Take 6.25 mg by mouth 2 (two) times daily with a meal.     EPINEPHrine  0.3 mg/0.3 mL IJ SOAJ injection Inject 0.3 mg into the muscle as needed for anaphylaxis. 1 each 0   ferric citrate  (AURYXIA ) 1 GM 210 MG(Fe) tablet      gabapentin  (NEURONTIN ) 100 MG capsule TAKE 2 CAPSULES (200 MG TOTAL) BY MOUTH 3 (THREE) TIMES DAILY. 540 capsule 0   glucose blood (ACCU-CHEK GUIDE) test strip User to check blood sugar up to 4x per day e11.9 100 each 12   hydrALAZINE  (APRESOLINE ) 10 MG tablet Take 1 tablet (10 mg total) by mouth 3 (three) times daily. 270 tablet 0   LOPERAMIDE HCL PO Take by mouth as needed.     omeprazole  (PRILOSEC) 40 MG capsule TAKE 1 CAPSULE (40 MG TOTAL) BY MOUTH DAILY. 90 capsule 0   prednisoLONE acetate (PRED FORTE) 1 % ophthalmic suspension SMARTSIG:1 Drop(s) Left Eye Daily PRN     sevelamer carbonate (RENVELA) 800 MG tablet Take 800-1,600 mg by mouth 3 (three) times daily with meals. Take 1,600 mg by mouth three times a day with meals and 800 mg with snacks     TYLENOL  500 MG tablet Take 500-1,000 mg by mouth every 6 (six) hours as needed for mild pain (pain score 1-3) or headache.     Zoster Vaccine Adjuvanted (SHINGRIX ) injection Administer Shingrix  vaccination now and repeat in two months 1 each 1   No current facility-administered medications for this visit.     Review of Systems    She denies chest pain, palpitations, dyspnea, pnd, orthopnea, n, v, dizziness, syncope, edema, weight  gain, or early satiety. All other systems reviewed and are otherwise negative except as noted above.   Physical Exam    VS:  BP 124/78   Pulse 76   Ht 5' 7 (1.702 m)   Wt 179 lb (81.2 kg)   LMP 03/07/2019   SpO2 95%   BMI 28.04 kg/m   STOP-Bang Score:  3      GEN:  Well nourished, well developed, in no acute distress. HEENT: normal. Neck: Supple, no JVD, carotid bruits, or masses. Cardiac: RRR, no murmurs, rubs, or gallops. No clubbing, cyanosis, edema.  Radials/DP/PT 2+ and equal bilaterally.  Respiratory:  Respirations regular and unlabored, clear to auscultation bilaterally. GI: Soft, nontender, nondistended, BS + x 4. MS: no deformity or atrophy. Skin: warm and dry, no rash. Neuro:  Strength and sensation are intact. Psych: Normal affect.  Accessory Clinical Findings    ECG personally reviewed by me today - EKG Interpretation Date/Time:  Thursday October 18 2024 14:19:06 EST Ventricular Rate:  76 PR Interval:  198 QRS Duration:  68 QT Interval:  414 QTC Calculation: 465 R Axis:   -3  Text Interpretation: Normal sinus rhythm Septal infarct (cited on or before 31-May-2024) When compared with ECG of 31-May-2024 09:41, No significant change was found Confirmed by [name] [name] ([id number]) on 10/18/2024 2:25:04 PM  - no acute changes.   Lab Results  Component Value Date   WBC 4.6 10/18/2024   HGB 11.4 10/18/2024   HCT 39.6 10/18/2024   MCV 78 (L) 10/18/2024   PLT 187 10/18/2024   Lab Results  Component Value Date   CREATININE 8.08 (H) 10/18/2024   BUN 50 (H) 10/18/2024   NA 137 10/18/2024   K 6.1 (HH) 10/18/2024   CL 91 (L) 10/18/2024   CO2 26 10/18/2024   Lab Results  Component Value Date   ALT 6 10/18/2024   AST 17 10/18/2024   ALKPHOS 84 10/18/2024   BILITOT 0.4 10/18/2024   Lab Results  Component Value Date   CHOL 106 11/22/2023   HDL 51 11/22/2023   LDLCALC 41 11/22/2023   TRIG 64 11/22/2023   CHOLHDL 2.1 11/22/2023    Lab Results  Component Value  Date   HGBA1C 5.8 08/28/2024    Assessment & Plan    1. CAD: Cardiac PET stress in 09/2024 showed medium defect with moderate reduction in uptake present in the apical to mid anterior locations, reversible, consistent with ischemia, EF 43% moderate coronary calcification in the LAD, LCx, RCA.  Stable with no anginal symptoms.  Once again reviewed recommendations for LHC with Dr. Shlomo, who recommends proceeding with LHC given abnormal stress test.  Patient is agreeable to proceed.  Will check CBC, CMET today.  LHC scheduled for 10/25/2024 with Dr. Mady. Will notify her nephrologist, Dr. Dalene, as well as [facility] of plans for upcoming cath.  Of note, she is no longer on insulin .  Continue aspirin, carvedilol , hydralazine , amlodipine -losartan , Lipitor.  Informed Consent   Shared Decision Making/Informed Consent The risks [stroke (1 in 1000), death (1 in 1000), kidney failure [usually temporary] (1 in 500), bleeding (1 in 200), allergic reaction [possibly serious] (1 in 200)], benefits (diagnostic support and management of coronary artery disease) and alternatives of a cardiac catheterization were discussed in detail with Ms. Sardo and she is willing to proceed.    2. Hypertension: BP well controlled. Continue current antihypertensive regimen.   3. Hyperlipidemia: LDL was 41 in 11/2023.  She will ultimately require updated fasting lipids, LP(a), will defer she is not fasting at this time. Continue Lipitor.  4. Type 2 diabetes: A1c was 5.8 in 08/2024.  Monitored and managed per PCP.  5. ESRD on HD: On HD MWF, Followed by nephrology.   6. Disposition: Follow-up 2 weeks post cath.      [name] JAYSON Daneen, NP 10/21/2024, 1:38 PM

## 2024-10-19 ENCOUNTER — Ambulatory Visit: Payer: Self-pay | Admitting: Nurse Practitioner

## 2024-10-19 ENCOUNTER — Telehealth: Payer: Self-pay | Admitting: Nurse Practitioner

## 2024-10-19 LAB — COMPREHENSIVE METABOLIC PANEL WITH GFR
ALT: 6 IU/L (ref 0–32)
AST: 17 IU/L (ref 0–40)
Albumin: 4.2 g/dL (ref 3.8–4.9)
Alkaline Phosphatase: 84 IU/L (ref 49–135)
BUN/Creatinine Ratio: 6 — AB (ref 9–23)
BUN: 50 mg/dL — AB (ref 6–24)
Bilirubin Total: 0.4 mg/dL (ref 0.0–1.2)
CO2: 26 mmol/L (ref 20–29)
Calcium: 9.2 mg/dL (ref 8.7–10.2)
Chloride: 91 mmol/L — AB (ref 96–106)
Creatinine, Ser: 8.08 mg/dL — AB (ref 0.57–1.00)
Globulin, Total: 3 g/dL (ref 1.5–4.5)
Glucose: 129 mg/dL — AB (ref 70–99)
Potassium: 6.1 mmol/L — AB (ref 3.5–5.2)
Sodium: 137 mmol/L (ref 134–144)
Total Protein: 7.2 g/dL (ref 6.0–8.5)
eGFR: 5 mL/min/1.73 — AB (ref >59)

## 2024-10-19 LAB — CBC
Hematocrit: 39.6 % (ref 34.0–46.6)
Hemoglobin: 11.4 g/dL (ref 11.1–15.9)
MCH: 22.5 pg — ABNORMAL LOW (ref 26.6–33.0)
MCHC: 28.8 g/dL — ABNORMAL LOW (ref 31.5–35.7)
MCV: 78 fL — ABNORMAL LOW (ref 79–97)
Platelets: 187 x10E3/uL (ref 150–450)
RBC: 5.06 x10E6/uL (ref 3.77–5.28)
RDW: 18.2 % — ABNORMAL HIGH (ref 11.7–15.4)
WBC: 4.6 x10E3/uL (ref 3.4–10.8)

## 2024-10-19 NOTE — Telephone Encounter (Signed)
 [facility] calling to report urgent results.

## 2024-10-19 NOTE — Telephone Encounter (Addendum)
 I received a phone call from [facility] with a critical lab result for this patient. I was told that her potassium is at 6.1. I am wating for the report to be faxed over and I am making Damien Braver aware. She stated we do not need to do anything because the patient is a dialysis patient and is receiving dialysis today.

## 2024-10-19 NOTE — Telephone Encounter (Signed)
 Pt returning call. Please advise.

## 2024-10-21 ENCOUNTER — Encounter: Payer: Self-pay | Admitting: Nurse Practitioner

## 2024-10-23 ENCOUNTER — Telehealth: Payer: Self-pay | Admitting: *Deleted

## 2024-10-23 NOTE — Telephone Encounter (Addendum)
 I spoke with Ethelene, Clinic Manager [facility] [phone]. -Ethelene tells me the information I was given this morning by TuTu was not correct and patient did not have any lab done yesterday (Monday). Christiana tells patient last had potassium at dialysis 10/17/24 when potassium result was 6.6.  -Ethelene tells me their provider was notified of that result with instructions to education patient on a low potassium diet.  Christiana tells me their routine is to check potassium weekly at dialysis unless otherwise ordered by their provider. Christiana tells me patient is scheduled to have potassium checked at dialysis tomorrow (Wednesday) and those results will not be available until Friday or Saturday this week. I will forward to Damien Braver, NP and Dr End for review and recommendations.

## 2024-10-23 NOTE — Telephone Encounter (Addendum)
 Cardiac Cath scheduled 10/25/24 with Dr End.  10/18/24 K 6.1-see comments below copied from 10/18/24 CMP: See attached precath labs. Patient is on dialysis Monday Wednesday Friday. Elevated potassium should be addressed at dialysis today. Blood counts are stable overall. She is scheduled for LHC on 10/25/2024 with Dr. Mady. [place] [facility] was notified of upcoming cath on 10/18/2024. Thank you -EM   I place call today (Tuesday November 18) to Dialysis [facility] [phone] to see if 10/18/24 potassium 6.1 had been addressed at dialysis.  I spoke with TuTu at the Dialysis Center and was told that she thought patient had lab yesterday at dialysis, but she did not have any results and did not know what lab had been done. I asked to speak with clinic manager and was told they were not there yet. TuTu tells me she is  going to ask lab staff to call me back today around 9 AM and let me know what lab was done yesterday and the status of the results.

## 2024-10-23 NOTE — Telephone Encounter (Signed)
 I recommend that a STAT BMP be drawn when she arrives for her catheterization.  If her potassium is still significant elevated, the catheterization will need to be postponed until her electrolytes are appropriate for catheterization.  Lonni Hanson, MD [facility]

## 2024-10-24 NOTE — Telephone Encounter (Signed)
 I spoke with patient and reviewed procedure instructions with patient. Patient has been advised that a BMP has been ordered to be done on arrival to Short Stay 10/25/24. Patient is aware that if potassium is still significantly elevated, cath will need to be postponed until electrolytes are appropriate for cath.  Cardiac Catheterization scheduled at [facility] for: Thursday October 25, 2024 9 AM Arrival time [facility] Main Entrance A at: 7 AM-needs BMP  Diet: -Nothing to eat after midnight.  Hydration: -May drink clear liquids until 2 hours before the procedure.  Approved liquids: Water, clear tea, black coffee, fruit juices-non-citric and without pulp,Gatorade, plain Jello/popsicles.  Medication instructions: -Usual morning medications can be taken including aspirin 81 mg.  You must have responsible adult to drive you home.  Someone must be with you the first 24 hours after you arrive home.  Dialysis MWF.

## 2024-10-25 ENCOUNTER — Encounter (HOSPITAL_COMMUNITY): Admission: RE | Disposition: A | Payer: Self-pay | Source: Home / Self Care | Attending: Internal Medicine

## 2024-10-25 ENCOUNTER — Observation Stay (HOSPITAL_COMMUNITY)
Admission: RE | Admit: 2024-10-25 | Discharge: 2024-10-26 | Disposition: A | Discharge status: Home / Self Care | Attending: Internal Medicine | Admitting: Internal Medicine

## 2024-10-25 ENCOUNTER — Other Ambulatory Visit: Payer: Self-pay

## 2024-10-25 DIAGNOSIS — E1122 Type 2 diabetes mellitus with diabetic chronic kidney disease: Secondary | ICD-10-CM | POA: Insufficient documentation

## 2024-10-25 DIAGNOSIS — R079 Chest pain, unspecified: Secondary | ICD-10-CM | POA: Diagnosis present

## 2024-10-25 DIAGNOSIS — E1140 Type 2 diabetes mellitus with diabetic neuropathy, unspecified: Secondary | ICD-10-CM | POA: Diagnosis not present

## 2024-10-25 DIAGNOSIS — Z7982 Long term (current) use of aspirin: Secondary | ICD-10-CM | POA: Insufficient documentation

## 2024-10-25 DIAGNOSIS — Z79899 Other long term (current) drug therapy: Secondary | ICD-10-CM | POA: Insufficient documentation

## 2024-10-25 DIAGNOSIS — I25118 Atherosclerotic heart disease of native coronary artery with other forms of angina pectoris: Secondary | ICD-10-CM

## 2024-10-25 DIAGNOSIS — I120 Hypertensive chronic kidney disease with stage 5 chronic kidney disease or end stage renal disease: Secondary | ICD-10-CM | POA: Insufficient documentation

## 2024-10-25 DIAGNOSIS — Z992 Dependence on renal dialysis: Secondary | ICD-10-CM | POA: Diagnosis not present

## 2024-10-25 DIAGNOSIS — N186 End stage renal disease: Secondary | ICD-10-CM | POA: Diagnosis not present

## 2024-10-25 DIAGNOSIS — R0789 Other chest pain: Secondary | ICD-10-CM

## 2024-10-25 DIAGNOSIS — E875 Hyperkalemia: Principal | ICD-10-CM | POA: Diagnosis present

## 2024-10-25 DIAGNOSIS — E1142 Type 2 diabetes mellitus with diabetic polyneuropathy: Secondary | ICD-10-CM | POA: Diagnosis present

## 2024-10-25 DIAGNOSIS — I10 Essential (primary) hypertension: Secondary | ICD-10-CM | POA: Diagnosis present

## 2024-10-25 DIAGNOSIS — E785 Hyperlipidemia, unspecified: Secondary | ICD-10-CM | POA: Insufficient documentation

## 2024-10-25 DIAGNOSIS — R9439 Abnormal result of other cardiovascular function study: Secondary | ICD-10-CM

## 2024-10-25 LAB — BASIC METABOLIC PANEL WITH GFR
Anion gap: 14 (ref 5–15)
BUN: 44 mg/dL — ABNORMAL HIGH (ref 6–20)
CO2: 28 mmol/L (ref 22–32)
Calcium: 8.6 mg/dL — ABNORMAL LOW (ref 8.9–10.3)
Chloride: 93 mmol/L — ABNORMAL LOW (ref 98–111)
Creatinine, Ser: 7.74 mg/dL — ABNORMAL HIGH (ref 0.44–1.00)
GFR, Estimated: 6 mL/min — ABNORMAL LOW (ref >60)
Glucose, Bld: 120 mg/dL — ABNORMAL HIGH (ref 70–99)
Potassium: 5.7 mmol/L — ABNORMAL HIGH (ref 3.5–5.1)
Sodium: 135 mmol/L (ref 135–145)

## 2024-10-25 LAB — GLUCOSE, CAPILLARY: Glucose-Capillary: 107 mg/dL — ABNORMAL HIGH (ref 70–99)

## 2024-10-25 LAB — HEPATITIS B SURFACE ANTIGEN: Hepatitis B Surface Ag: NONREACTIVE

## 2024-10-25 SURGERY — LEFT HEART CATH AND CORONARY ANGIOGRAPHY
Anesthesia: LOCAL

## 2024-10-25 MED ORDER — HEPARIN SODIUM (PORCINE) 1000 UNIT/ML DIALYSIS
3500.0000 [IU] | Freq: Once | INTRAMUSCULAR | Status: AC
  Administered 2024-10-25: 3500 [IU] via INTRAVENOUS_CENTRAL

## 2024-10-25 MED ORDER — SODIUM CHLORIDE 0.9 % IV SOLN: 250.0000 mL | INTRAVENOUS | Status: DC | PRN

## 2024-10-25 MED ORDER — CARVEDILOL 6.25 MG PO TABS
6.2500 mg | ORAL_TABLET | Freq: Two times a day (BID) | ORAL | Status: DC
  Administered 2024-10-25 – 2024-10-26 (×2): 6.25 mg via ORAL
  Filled 2024-10-25 (×2): qty 1

## 2024-10-25 MED ORDER — HEPARIN SODIUM (PORCINE) 1000 UNIT/ML DIALYSIS: 1000.0000 [IU] | INTRAMUSCULAR | Status: DC | PRN

## 2024-10-25 MED ORDER — SODIUM CHLORIDE 0.9% FLUSH: 3.0000 mL | INTRAVENOUS | Status: DC | PRN

## 2024-10-25 MED ORDER — ONDANSETRON HCL 4 MG/2ML IJ SOLN
4.0000 mg | Freq: Four times a day (QID) | INTRAMUSCULAR | Status: DC | PRN
Start: 2024-10-25 — End: 2024-10-26

## 2024-10-25 MED ORDER — NITROGLYCERIN 0.4 MG SL SUBL: 0.4000 mg | SUBLINGUAL_TABLET | SUBLINGUAL | Status: DC | PRN

## 2024-10-25 MED ORDER — LIDOCAINE-PRILOCAINE 2.5-2.5 % EX CREA: 1.0000 | TOPICAL_CREAM | CUTANEOUS | Status: DC | PRN

## 2024-10-25 MED ORDER — HYDRALAZINE HCL 10 MG PO TABS
10.0000 mg | ORAL_TABLET | Freq: Three times a day (TID) | ORAL | Status: DC
  Administered 2024-10-25 – 2024-10-26 (×2): 10 mg via ORAL
  Filled 2024-10-25 (×2): qty 1

## 2024-10-25 MED ORDER — SODIUM CHLORIDE 0.9% FLUSH
3.0000 mL | Freq: Two times a day (BID) | INTRAVENOUS | Status: DC
  Administered 2024-10-25: 3 mL via INTRAVENOUS

## 2024-10-25 MED ORDER — ALTEPLASE 2 MG IJ SOLR: 2.0000 mg | Freq: Once | INTRAMUSCULAR | Status: DC | PRN

## 2024-10-25 MED ORDER — ACETAMINOPHEN 325 MG PO TABS: 650.0000 mg | ORAL_TABLET | ORAL | Status: DC | PRN

## 2024-10-25 MED ORDER — ASPIRIN 81 MG PO CHEW: 81.0000 mg | CHEWABLE_TABLET | ORAL | Status: DC

## 2024-10-25 MED ORDER — ASPIRIN 81 MG PO TBEC
81.0000 mg | DELAYED_RELEASE_TABLET | Freq: Every day | ORAL | Status: DC
  Filled 2024-10-25: qty 1

## 2024-10-25 MED ORDER — ASPIRIN 81 MG PO TBEC: 81.0000 mg | DELAYED_RELEASE_TABLET | Freq: Every day | ORAL | Status: DC

## 2024-10-25 MED ORDER — HEPARIN SODIUM (PORCINE) 5000 UNIT/ML IJ SOLN
5000.0000 [IU] | Freq: Three times a day (TID) | INTRAMUSCULAR | Status: DC
  Administered 2024-10-25 – 2024-10-26 (×2): 5000 [IU] via SUBCUTANEOUS
  Filled 2024-10-25 (×2): qty 1

## 2024-10-25 MED ORDER — HEPARIN SODIUM (PORCINE) 1000 UNIT/ML DIALYSIS
1500.0000 [IU] | INTRAMUSCULAR | Status: AC | PRN
  Administered 2024-10-25: 1500 [IU] via INTRAVENOUS_CENTRAL

## 2024-10-25 MED ORDER — IRBESARTAN 300 MG PO TABS
150.0000 mg | ORAL_TABLET | Freq: Every day | ORAL | Status: DC
  Administered 2024-10-26: 150 mg via ORAL
  Filled 2024-10-25: qty 1

## 2024-10-25 MED ORDER — ATORVASTATIN CALCIUM 40 MG PO TABS
40.0000 mg | ORAL_TABLET | Freq: Every day | ORAL | Status: DC
  Administered 2024-10-26: 40 mg via ORAL
  Filled 2024-10-25: qty 1

## 2024-10-25 MED ORDER — SEVELAMER CARBONATE 800 MG PO TABS
800.0000 mg | ORAL_TABLET | Freq: Three times a day (TID) | ORAL | Status: DC
  Administered 2024-10-25 – 2024-10-26 (×2): 800 mg via ORAL
  Filled 2024-10-25 (×2): qty 1

## 2024-10-25 MED ORDER — PENTAFLUOROPROP-TETRAFLUOROETH EX AERO: 1.0000 | INHALATION_SPRAY | CUTANEOUS | Status: DC | PRN

## 2024-10-25 MED ORDER — GABAPENTIN 100 MG PO CAPS
200.0000 mg | ORAL_CAPSULE | Freq: Three times a day (TID) | ORAL | Status: DC
  Administered 2024-10-25 – 2024-10-26 (×2): 200 mg via ORAL
  Filled 2024-10-25 (×2): qty 2

## 2024-10-25 MED ORDER — PANTOPRAZOLE SODIUM 40 MG PO TBEC
40.0000 mg | DELAYED_RELEASE_TABLET | Freq: Every day | ORAL | Status: DC
  Administered 2024-10-26: 40 mg via ORAL
  Filled 2024-10-25: qty 1

## 2024-10-25 MED ORDER — ASPIRIN 81 MG PO CHEW
81.0000 mg | CHEWABLE_TABLET | ORAL | Status: AC
  Administered 2024-10-26: 81 mg via ORAL
  Filled 2024-10-25: qty 1

## 2024-10-25 MED ORDER — DIALYVITE 800 0.8 MG PO TABS: 1.0000 | ORAL_TABLET | Freq: Every day | ORAL | Status: DC

## 2024-10-25 MED ORDER — ANTICOAGULANT SODIUM CITRATE 4% (200MG/5ML) IV SOLN: 5.0000 mL | Status: DC | PRN

## 2024-10-25 MED ORDER — CHLORHEXIDINE GLUCONATE CLOTH 2 % EX PADS
6.0000 | MEDICATED_PAD | Freq: Every day | CUTANEOUS | Status: DC
  Administered 2024-10-26: 6 via TOPICAL

## 2024-10-25 MED ORDER — NEPRO/CARBSTEADY PO LIQD
237.0000 mL | ORAL | Status: DC | PRN
  Filled 2024-10-25: qty 237

## 2024-10-25 MED ORDER — LIDOCAINE HCL (PF) 1 % IJ SOLN: 5.0000 mL | INTRAMUSCULAR | Status: DC | PRN

## 2024-10-25 MED ORDER — FREE WATER
250.0000 mL | Freq: Once | Status: AC
  Administered 2024-10-25: 250 mL via ORAL

## 2024-10-25 MED ORDER — HEPARIN SODIUM (PORCINE) 1000 UNIT/ML IJ SOLN
INTRAMUSCULAR | Status: AC
  Filled 2024-10-25: qty 2

## 2024-10-25 MED ORDER — RENA-VITE PO TABS
1.0000 | ORAL_TABLET | Freq: Every day | ORAL | Status: DC
  Administered 2024-10-25: 1 via ORAL
  Filled 2024-10-25: qty 1

## 2024-10-25 MED ORDER — AMLODIPINE BESYLATE 10 MG PO TABS
10.0000 mg | ORAL_TABLET | Freq: Every day | ORAL | Status: DC
  Administered 2024-10-26: 10 mg via ORAL
  Filled 2024-10-25: qty 1

## 2024-10-25 MED ORDER — SODIUM CHLORIDE 0.9% FLUSH: 3.0000 mL | Freq: Two times a day (BID) | INTRAVENOUS | Status: DC

## 2024-10-25 MED ORDER — AMLODIPINE-OLMESARTAN 10-20 MG PO TABS: 1.0000 | ORAL_TABLET | Freq: Every day | ORAL | Status: DC

## 2024-10-25 NOTE — Consult Note (Signed)
 Renal Service Consult Note [facility] [name] JONETTA Fret, MD  Patient: [name] Date: 10/25/2024 Requesting Physician: Dr. Mady  Reason for Consult: ESRD pt w/ hyperkalemia HPI: The patient is a 58 y.o. year-old w/ PMH as below who presented to [facility] for an elective cardiac procedure. Pre-op  labs showed K+ 5.7 w/ some early EKG changes. Procedure was cancelled and pt admitted. We are asked to see for dialysis.    Pt seen in SS room. No c/o's at this time. On HD x 8 hrs. No SOB, no leg swelling. Wt gains around 2.5- 3.5 kg.    ROS - denies CP, no joint pain, no HA, no blurry vision, no rash, no diarrhea, no nausea/ vomiting   Past Medical History  Past Medical History:  Diagnosis Date   Alcohol dependency (HCC)    Anemia    Appendicitis 12/06/2018   Chronic kidney disease    Diabetes mellitus    type 2   Diabetes mellitus type 2, uncontrolled    End stage renal disease (HCC) 05/05/2016   Starting 04/2016, LUE fistula   Enterococcal bacteremia    Hypertension    Legally blind in left eye, as defined in [place]     Liver abscess 12/20/2018   Necrotizing fasciitis (HCC)    PONV (postoperative nausea and vomiting)    Pulmonary HTN (HCC)    mild with PASP on echo 07/2024   Sepsis without acute organ dysfunction (HCC)    Type 2 diabetes mellitus with diabetic chronic kidney disease (HCC) 05/05/2016   Past Surgical History  Past Surgical History:  Procedure Laterality Date   A/V FISTULAGRAM N/A 02/09/2024   Procedure: A/V Fistulagram;  Surgeon: Melia Lynwood ORN, MD;  [facility];  Service: Cardiovascular;  Laterality: N/A;   A/V FISTULAGRAM Left 10/11/2024   Procedure: A/V Fistulagram;  Surgeon: Sheree Penne Bruckner, MD;  Location: [facility];  Service: Cardiovascular;  Laterality: Left;   AMPUTATION     right first and left middle finger amputation 2/2 OM 08/2011 by Dr. Camella   AMPUTATION  01/18/2012   Procedure: AMPUTATION DIGIT;   Surgeon: Franky JONELLE Curia, MD;  Location:  SURGERY CENTER;  Service: Orthopedics;  Laterality: Left;  revision amputation left thumb   AMPUTATION  08/25/2012   Procedure: AMPUTATION BELOW KNEE;  Surgeon: Jerona LULLA Sage, MD;  Location: [facility];  Service: Orthopedics;  Laterality: Right;  Right Below Knee Amputation   APPENDECTOMY     BASCILIC VEIN TRANSPOSITION Left 07/22/2015   Procedure: BASILIC VEIN TRANSPOSITION ;  Surgeon: Carlin FORBES Haddock, MD;  Location: [facility] OR;  Service: Vascular;  Laterality: Left;   CATARACT EXTRACTION     right    CESAREAN SECTION  1986   CESAREAN SECTION  1985   CESAREAN SECTION  1991   COLONOSCOPY WITH PROPOFOL  N/A 08/25/2023   Procedure: COLONOSCOPY WITH PROPOFOL ;  Surgeon: Aneita Gwendlyn DASEN, MD;  Location: [facility];  Service: Gastroenterology;  Laterality: N/A;   I & D EXTREMITY  12/24/2011   Procedure: IRRIGATION AND DEBRIDEMENT EXTREMITY;  Surgeon: Franky JONELLE Curia, MD;  Location: [facility];  Service: Orthopedics;  Laterality: Left;  I&Dleft thumb with partial amputation   I & D EXTREMITY  12/26/2011   Procedure: IRRIGATION AND DEBRIDEMENT EXTREMITY;  Surgeon: Franky JONELLE Curia, MD;  Location: [facility];  Service: Orthopedics;  Laterality: Left;   I & D EXTREMITY  12/23/2011   Procedure: IRRIGATION AND DEBRIDEMENT EXTREMITY;  Surgeon: Franky JONELLE  Murrell, MD;  [facility]: [facility];  Service: Orthopedics;  Laterality: Left;  I&D Left thumb, hand and arm   I&D left thumb  12/23/2011   IR RADIOLOGIST EVAL & MGMT  01/04/2019   IR RADIOLOGIST EVAL & MGMT  01/25/2019   LAPAROSCOPIC APPENDECTOMY N/A 12/06/2018   Procedure: APPENDECTOMY LAPAROSCOPIC;  Surgeon: Kinsinger, Herlene Righter, MD;  [facility];  Service: General;  Laterality: N/A;   POLYPECTOMY  08/25/2023   Procedure: POLYPECTOMY;  Surgeon: Aneita Gwendlyn DASEN, MD;  [facility];  Service: Gastroenterology;;   TUBAL LIGATION     Family History  Family History  Problem Relation Age of Onset   Diabetes Mother     Alcohol abuse Mother    Diabetes Father    Alzheimer's disease Father    Hypertension Father    Kidney failure Sister    Diabetes Sister    High blood pressure Sister    Social History  reports that she has never smoked. She has never been exposed to tobacco smoke. She has never used smokeless tobacco. She reports current alcohol use. She reports current drug use. Drug: Cocaine. Allergies  Allergies  Allergen Reactions   Shrimp Extract Nausea And Vomiting, Swelling and Other (See Comments)    Patient ended up in the ED after eating this- left eye is puffy   Home medications Prior to Admission medications   Medication Sig Start Date End Date Taking? Authorizing Provider  amlodipine -olmesartan  (AZOR ) 10-20 MG tablet TAKE 1 TABLET BY MOUTH DAILY. 10/15/24  Yes Romelle Booty, MD  aspirin EC 81 MG tablet Take 1 tablet (81 mg total) by mouth daily. Swallow whole. 10/11/24  Yes Turner, Wilbert SAUNDERS, MD  atorvastatin  (LIPITOR) 40 MG tablet TAKE 1 TABLET (40 MG TOTAL) BY MOUTH DAILY. 08/15/24  Yes Romelle Booty, MD  B Complex-C-Folic Acid  (DIALYVITE  800) 0.8 MG TABS Take 1 tablet by mouth daily. 10/09/18  Yes [provider]  carvedilol  (COREG ) 6.25 MG tablet Take 6.25 mg by mouth 2 (two) times daily with a meal.   Yes [provider]  gabapentin  (NEURONTIN ) 100 MG capsule TAKE 2 CAPSULES (200 MG TOTAL) BY MOUTH 3 (THREE) TIMES DAILY. 10/08/24  Yes Romelle Booty, MD  hydrALAZINE  (APRESOLINE ) 10 MG tablet Take 1 tablet (10 mg total) by mouth 3 (three) times daily. 03/04/24  Yes Romelle Booty, MD  prednisoLONE acetate (PRED FORTE) 1 % ophthalmic suspension SMARTSIG:1 Drop(s) Left Eye Daily PRN 02/17/24  Yes [provider]  TYLENOL  500 MG tablet Take 500-1,000 mg by mouth every 6 (six) hours as needed for mild pain (pain score 1-3) or headache.   Yes [provider]  Blood Pressure Monitoring (ADULT BLOOD PRESSURE CUFF LG) KIT 1 each by Does not apply route daily. 04/27/22    Henriette Mora, MD  EPINEPHrine  0.3 mg/0.3 mL IJ SOAJ injection Inject 0.3 mg into the muscle as needed for anaphylaxis. 10/02/23   Franklyn Sid SAILOR, MD  ferric citrate  (AURYXIA ) 1 GM 210 MG(Fe) tablet  05/22/19   [provider]  glucose blood (ACCU-CHEK GUIDE) test strip User to check blood sugar up to 4x per day e11.9 10/07/22   Romelle Booty, MD  LOPERAMIDE HCL PO Take by mouth as needed. 01/20/24 01/16/25  [provider]  omeprazole  (PRILOSEC) 40 MG capsule TAKE 1 CAPSULE (40 MG TOTAL) BY MOUTH DAILY. 10/08/24   Romelle Booty, MD  sevelamer carbonate (RENVELA) 800 MG tablet Take 800-1,600 mg by mouth 3 (three) times daily with meals. Take  1,600 mg by mouth three times a day with meals and 800 mg with snacks    [provider]  Zoster Vaccine Adjuvanted (SHINGRIX ) injection Administer Shingrix  vaccination now and repeat in two months 11/22/23   Theophilus Pagan, MD     Vitals:   10/25/24 0817  BP: (!) 140/90  Pulse: 80  Resp: 16  Temp: 98.5 F (36.9 C)  TempSrc: Oral  SpO2: 100%  Weight: 80.7 kg  Height: 5' 7 (1.702 m)   Exam Gen alert, no distress Sclera anicteric, throat clear  No jvd or bruits Chest clear bilat to bases RRR no MRG Abd soft ntnd no mass or ascites +bs Ext R BKA, no LE or UE edema Neuro is alert, Ox 3 , nf    LUA AVF+bruit   Home bp meds: Amlodipine -olmesartan  10-20 mg every day Coreg  6.25 mg bid Hydralazine  10mg  tid   [facility] HD: [facility] 3h  B400   79.5kg  AVF  Heparin  4500 Last OP HD 11/19, post wt 80.0 Gets to dry wt, doesn't miss or s/o early  Assessment/ Plan: Hyperkalemia: K+ 5.7 today, plan HD later today w/ low K+ bath.  Chest pain: procedure postponed for now per pmd. ESRD: on HD MWF. HD today as above.  HTN: bp's stable, ok to resume home BP meds to start after HD.  Volume: no vol excess, usual wt gain is 3 kg.  Anemia of esrd: Hb 11, no esa needs.    Myer Fret  MD [facility] 10/25/2024, 10:21 AM  Recent  Labs  Lab 10/18/24 1606 10/25/24 0834  HGB 11.4  --   ALBUMIN  4.2  --   CALCIUM  9.2 8.6*  CREATININE 8.08* 7.74*  K 6.1* 5.7*   Inpatient medications:  aspirin  81 mg Oral Pre-Cath   sodium chloride  flush  3 mL Intravenous Q12H    sodium chloride      sodium chloride , sodium chloride  flush

## 2024-10-25 NOTE — Progress Notes (Signed)
 Pt receives out-pt HD, MWF at [facility] [place] clinic 1050 am chair time. Will assist as needed   Lavanda Fredrickson Dialysis Navigator [phone]

## 2024-10-25 NOTE — Plan of Care (Signed)

## 2024-10-25 NOTE — H&P (Addendum)
 Cardiology Admission History and Physical   Patient ID: [name] MRN: [id number]; DOB: 1966/07/18   Admission date: 10/25/2024  PCP:  Romelle Booty, MD   [facility] Providers Cardiologist:  Wilbert Bihari, MD       Chief Complaint: Abnormal stress test  HPI: [name] is a 58 year old woman with history of CAD, HTN, HLD, DM2, and ESRD on HD complicated by recurrent hyperkalemia, presenting for outpatient catheterization for evaluation of atypical chest pain and abnormal stress test.  Myocardial PET/CT was performed last month, showing reversible anterior, anterolateral, and anteroseptal defect concerning for ischemia.  Study was felt to be high risk.  Patient denies any further frank chest pain but has intermittent palpitations.  She was noted to have significant hyperkalemia on precatheterization labs with a potassium of 6.1.  She was counseled on minimizing her potassium intake and has been compliant with her hemodialysis schedule (last session yesterday).  Repeat labs this morning show potassium is still elevated at 5.7.   Past Medical History:  Diagnosis Date   Alcohol dependency (HCC)    Anemia    Appendicitis 12/06/2018   Chronic kidney disease    Diabetes mellitus    type 2   Diabetes mellitus type 2, uncontrolled    Lenis Nettleton stage renal disease (HCC) 05/05/2016   Starting 04/2016, LUE fistula   Enterococcal bacteremia    Hypertension    Legally blind in left eye, as defined in [place]     Liver abscess 12/20/2018   Necrotizing fasciitis (HCC)    PONV (postoperative nausea and vomiting)    Pulmonary HTN (HCC)    mild with PASP on echo 07/2024   Sepsis without acute organ dysfunction (HCC)    Type 2 diabetes mellitus with diabetic chronic kidney disease (HCC) 05/05/2016   Past Surgical History:  Procedure Laterality Date   A/V FISTULAGRAM N/A 02/09/2024   Procedure: A/V Fistulagram;  Surgeon: Melia Lynwood ORN, MD;  [facility];  Service:  Cardiovascular;  Laterality: N/A;   A/V FISTULAGRAM Left 10/11/2024   Procedure: A/V Fistulagram;  Surgeon: Sheree Penne Bruckner, MD;  Location: [facility];  Service: Cardiovascular;  Laterality: Left;   AMPUTATION     right first and left middle finger amputation 2/2 OM 08/2011 by Dr. Camella   AMPUTATION  01/18/2012   Procedure: AMPUTATION DIGIT;  Surgeon: Franky JONELLE Curia, MD;  Location: [facility];  Service: Orthopedics;  Laterality: Left;  revision amputation left thumb   AMPUTATION  08/25/2012   Procedure: AMPUTATION BELOW KNEE;  Surgeon: Jerona LULLA Sage, MD;  Location[facility];  Service: Orthopedics;  Laterality: Right;  Right Below Knee Amputation   APPENDECTOMY     BASCILIC VEIN TRANSPOSITION Left 07/22/2015   Procedure: BASILIC VEIN TRANSPOSITION ;  Surgeon: Carlin FORBES Haddock, MD;  Location: [facility] OR;  Service: Vascular;  Laterality: Left;   CATARACT EXTRACTION     right    CESAREAN SECTION  1986   CESAREAN SECTION  1985   CESAREAN SECTION  1991   COLONOSCOPY WITH PROPOFOL  N/A 08/25/2023   Procedure: COLONOSCOPY WITH PROPOFOL ;  Surgeon: Aneita Gwendlyn DASEN, MD;  [facility];  Service: Gastroenterology;  Laterality: N/A;   I & D EXTREMITY  12/24/2011   Procedure: IRRIGATION AND DEBRIDEMENT EXTREMITY;  Surgeon: Franky JONELLE Curia, MD;  [facility];  Service: Orthopedics;  Laterality: Left;  I&Dleft thumb with partial amputation   I & D EXTREMITY  12/26/2011   Procedure: IRRIGATION  AND DEBRIDEMENT EXTREMITY;  Surgeon: Franky JONELLE Curia, MD;  Location: [facility] OR;  Service: Orthopedics;  Laterality: Left;   I & D EXTREMITY  12/23/2011   Procedure: IRRIGATION AND DEBRIDEMENT EXTREMITY;  Surgeon: Franky JONELLE Curia, MD;  [facility];  Service: Orthopedics;  Laterality: Left;  I&D Left thumb, hand and arm   I&D left thumb  12/23/2011   IR RADIOLOGIST EVAL & MGMT  01/04/2019   IR RADIOLOGIST EVAL & MGMT  01/25/2019   LAPAROSCOPIC APPENDECTOMY N/A 12/06/2018   Procedure:  APPENDECTOMY LAPAROSCOPIC;  Surgeon: Kinsinger, Herlene Righter, MD;  Location[facility];  Service: General;  Laterality: N/A;   POLYPECTOMY  08/25/2023   Procedure: POLYPECTOMY;  Surgeon: Aneita Gwendlyn DASEN, MD;  Location[facility];  Service: Gastroenterology;;   TUBAL LIGATION       Medications Prior to Admission: Prior to Admission medications   Medication Sig Start Date [name] Date Taking? Authorizing Provider  amlodipine -olmesartan  (AZOR ) 10-20 MG tablet TAKE 1 TABLET BY MOUTH DAILY. 10/15/24  Yes Romelle Booty, MD  aspirin EC 81 MG tablet Take 1 tablet (81 mg total) by mouth daily. Swallow whole. 10/11/24  Yes Turner, Wilbert JONELLE, MD  atorvastatin  (LIPITOR) 40 MG tablet TAKE 1 TABLET (40 MG TOTAL) BY MOUTH DAILY. 08/15/24  Yes Romelle Booty, MD  B Complex-C-Folic Acid  (DIALYVITE  800) 0.8 MG TABS Take 1 tablet by mouth daily. 10/09/18  Yes [provider]  carvedilol  (COREG ) 6.25 MG tablet Take 6.25 mg by mouth 2 (two) times daily with a meal.   Yes [provider]  gabapentin  (NEURONTIN ) 100 MG capsule TAKE 2 CAPSULES (200 MG TOTAL) BY MOUTH 3 (THREE) TIMES DAILY. 10/08/24  Yes Romelle Booty, MD  hydrALAZINE  (APRESOLINE ) 10 MG tablet Take 1 tablet (10 mg total) by mouth 3 (three) times daily. 03/04/24  Yes Romelle Booty, MD  prednisoLONE acetate (PRED FORTE) 1 % ophthalmic suspension SMARTSIG:1 Drop(s) Left Eye Daily PRN 02/17/24  Yes [provider]  TYLENOL  500 MG tablet Take 500-1,000 mg by mouth every 6 (six) hours as needed for mild pain (pain score 1-3) or headache.   Yes [provider]  Blood Pressure Monitoring (ADULT BLOOD PRESSURE CUFF LG) KIT 1 each by Does not apply route daily. 04/27/22   Henriette Mora, MD  EPINEPHrine  0.3 mg/0.3 mL IJ SOAJ injection Inject 0.3 mg into the muscle as needed for anaphylaxis. 10/02/23   Franklyn Sid SAILOR, MD  ferric citrate  (AURYXIA ) 1 GM 210 MG(Fe) tablet  05/22/19   [provider]  glucose blood (ACCU-CHEK GUIDE) test  strip User to check blood sugar up to 4x per day e11.9 10/07/22   Romelle Booty, MD  LOPERAMIDE HCL PO Take by mouth as needed. 01/20/24 01/16/25  [provider]  omeprazole  (PRILOSEC) 40 MG capsule TAKE 1 CAPSULE (40 MG TOTAL) BY MOUTH DAILY. 10/08/24   Romelle Booty, MD  sevelamer carbonate (RENVELA) 800 MG tablet Take 800-1,600 mg by mouth 3 (three) times daily with meals. Take 1,600 mg by mouth three times a day with meals and 800 mg with snacks    [provider]  Zoster Vaccine Adjuvanted (SHINGRIX ) injection Administer Shingrix  vaccination now and repeat in two months 11/22/23   Theophilus Pagan, MD     Allergies:    Allergies  Allergen Reactions   Shrimp Extract Nausea And Vomiting, Swelling and Other (See Comments)    Patient ended up in the ED after eating this- left eye is puffy    Social History:  Social History   Tobacco Use   Smoking status: Never    Passive exposure: Never   Smokeless tobacco: Never  Vaping Use   Vaping status: Never Used  Substance Use Topics   Alcohol use: Yes    Comment: h/o alcohol abuse 4 years ago (07/21/15)   Drug use: Yes    Types: Cocaine    Comment:  last used cocaine 4 years ago (07/21/15)   Family History:   The patient's family history includes Alcohol abuse in her mother; Alzheimer's disease in her father; Diabetes in her father, mother, and sister; High blood pressure in her sister; Hypertension in her father; Kidney failure in her sister.    ROS:  Please see the history of present illness.  All other ROS reviewed and negative.     Physical Exam/Data: Vitals:   10/25/24 0817  BP: (!) 140/90  Pulse: 80  Resp: 16  Temp: 98.5 F (36.9 C)  TempSrc: Oral  SpO2: 100%  Weight: 80.7 kg  Height: 5' 7 (1.702 m)    Intake/Output Summary (Last 24 hours) at 10/25/2024 0950 Last data filed at 10/25/2024 9178 Gross per 24 hour  Intake 250 ml  Output --  Net 250 ml      10/25/2024    8:17 AM 10/18/2024    2:23  PM 08/28/2024    9:46 AM  Last 3 Weights  Weight (lbs) 178 lb 179 lb 179 lb 6.4 oz  Weight (kg) 80.74 kg 81.194 kg 81.375 kg     Body mass index is 27.88 kg/m.  General:  Well nourished, well developed, in no acute distress HEENT: normal Neck: no JVD Vascular: No carotid bruits; Distal pulses 2+ bilaterally   Cardiac: Distant heart sounds.  Regular rate and rhythm without murmurs. Lungs:  clear to auscultation bilaterally, no wheezing, rhonchi or rales  Abd: soft, nontender, no hepatomegaly  Ext: No lower extremity edema.  Left upper extremity hemodialysis fistula in place. Musculoskeletal:  No deformities, BUE and BLE strength normal and equal Skin: warm and dry  Neuro: Somnolent but answers of questions appropriately.  Able to move all extremities.  EKG:  The ECG that was done today was personally reviewed and demonstrates normal sinus rhythm with first-degree AV block and subtle peaking of T waves.  Relevant CV Studies: Myocardial PET/CT (10/02/2024): Challenging study with reversible anterior, anteroseptal, and anterolateral defect concerning for ischemia.  LVEF 43% at rest and 53% at stress.  Coronary artery calcification noted as well as dilation of the pulmonary artery.  Laboratory Data: High Sensitivity Troponin:  No results for input(s): TROPONINIHS in the last 720 hours.    Chemistry Recent Labs  Lab 10/18/24 1606 10/25/24 0834  NA 137 135  K 6.1* 5.7*  CL 91* 93*  CO2 26 28  GLUCOSE 129* 120*  BUN 50* 44*  CREATININE 8.08* 7.74*  CALCIUM  9.2 8.6*  GFRNONAA  --  6*  ANIONGAP  --  14    Recent Labs  Lab 10/18/24 1606  PROT 7.2  ALBUMIN  4.2  AST 17  ALT 6  ALKPHOS 84  BILITOT 0.4   Lipids No results for input(s): CHOL, TRIG, HDL, LABVLDL, LDLCALC, CHOLHDL in the last 168 hours. Hematology Recent Labs  Lab 10/18/24 1606  WBC 4.6  RBC 5.06  HGB 11.4  HCT 39.6  MCV 78*  MCH 22.5*  MCHC 28.8*  RDW 18.2*  PLT 187   Thyroid No results  for input(s): TSH, FREET4 in the last 168 hours.  BNPNo results for input(s): BNP, PROBNP in the last 168 hours.  DDimer No results for input(s): DDIMER in the last 168 hours.  Radiology/Studies:  No results found.  Assessment and Plan: Chest pain and abnormal stress test: [name] presents for catheterization today for further evaluation of her atypical chest pain and abnormal, high risk myocardial PET/CT stress test.  She is asymptomatic at this time.  It is reasonable to proceed with catheterization, though I am concerned about her persistent hyperkalemia (see details below).  Given subtle peaking of T waves as well as mild PR prolongation which could be due to elevated potassium levels, we will postpone catheterization until her potassium has been corrected.  Informed Consent   Shared Decision Making/Informed Consent The risks [stroke (1 in 1000), death (1 in 1000), kidney failure [usually temporary] (1 in 500), bleeding (1 in 200), allergic reaction [possibly serious] (1 in 200)], benefits (diagnostic support and management of coronary artery disease) and alternatives of a cardiac catheterization were discussed in detail with [name] and she is willing to proceed.    Hyperkalemia and Keira Bohlin-stage renal disease: Patient has had elevated potassium intermittently in the past, most recently 6.1 last week.  Repeat potassium today is still elevated at 5.7 with subtle changes of hyperkalemia on EKG.  I have spoken with Dr. Geralynn of nephrology, who will proceed with hemodialysis today.  Depending on the timing of HD, catheterization may be able to be performed this afternoon or tomorrow.  Given likelihood that catheterization will need to be postponed to tomorrow, we will place admission orders to facilitate inpatient hemodialysis.  Hypertension: Mildly elevated blood pressure.  Continue home medications.  Type 2 diabetes mellitus: Continue home regimen of carvedilol  and  hydralazine .  Risk Assessment/Risk Scores:    Code Status: Full Code  Severity of Illness: The appropriate patient status for this patient is INPATIENT. Inpatient status is judged to be reasonable and necessary in order to provide the required intensity of service to ensure the patient's safety. The patient's presenting symptoms, physical exam findings, and initial radiographic and laboratory data in the context of their chronic comorbidities is felt to place them at high risk for further clinical deterioration. Furthermore, it is not anticipated that the patient will be medically stable for discharge from the hospital within 2 midnights of admission.   * I certify that at the point of admission it is my clinical judgment that the patient will require inpatient hospital care spanning beyond 2 midnights from the point of admission due to high intensity of service, high risk for further deterioration and high frequency of surveillance required.*   For questions or updates, please contact [facility] Please consult [email or web address] for contact info under     Signed, Lonni Hanson, MD  10/25/2024 9:50 AM

## 2024-10-26 ENCOUNTER — Encounter (HOSPITAL_COMMUNITY)
Admission: RE | Disposition: A | Discharge status: Home / Self Care | Payer: Self-pay | Source: Home / Self Care | Attending: Internal Medicine

## 2024-10-26 ENCOUNTER — Encounter (HOSPITAL_COMMUNITY): Payer: Self-pay | Admitting: Internal Medicine

## 2024-10-26 DIAGNOSIS — R079 Chest pain, unspecified: Secondary | ICD-10-CM | POA: Diagnosis not present

## 2024-10-26 DIAGNOSIS — N186 End stage renal disease: Secondary | ICD-10-CM | POA: Diagnosis not present

## 2024-10-26 DIAGNOSIS — I10 Essential (primary) hypertension: Secondary | ICD-10-CM

## 2024-10-26 DIAGNOSIS — E1142 Type 2 diabetes mellitus with diabetic polyneuropathy: Secondary | ICD-10-CM

## 2024-10-26 DIAGNOSIS — E875 Hyperkalemia: Secondary | ICD-10-CM | POA: Diagnosis not present

## 2024-10-26 DIAGNOSIS — Z992 Dependence on renal dialysis: Secondary | ICD-10-CM

## 2024-10-26 DIAGNOSIS — E785 Hyperlipidemia, unspecified: Secondary | ICD-10-CM

## 2024-10-26 DIAGNOSIS — E1140 Type 2 diabetes mellitus with diabetic neuropathy, unspecified: Secondary | ICD-10-CM | POA: Diagnosis not present

## 2024-10-26 HISTORY — PX: LEFT HEART CATH AND CORONARY ANGIOGRAPHY: CATH118249

## 2024-10-26 LAB — CBC
HCT: 40.1 % (ref 36.0–46.0)
Hemoglobin: 12 g/dL (ref 12.0–15.0)
MCH: 22 pg — ABNORMAL LOW (ref 26.0–34.0)
MCHC: 29.9 g/dL — ABNORMAL LOW (ref 30.0–36.0)
MCV: 73.4 fL — ABNORMAL LOW (ref 80.0–100.0)
Platelets: 182 K/uL (ref 150–400)
RBC: 5.46 MIL/uL — ABNORMAL HIGH (ref 3.87–5.11)
RDW: 20.5 % — ABNORMAL HIGH (ref 11.5–15.5)
WBC: 4.1 K/uL (ref 4.0–10.5)
nRBC: 0 % (ref 0.0–0.2)

## 2024-10-26 LAB — BASIC METABOLIC PANEL WITH GFR
Anion gap: 17 — ABNORMAL HIGH (ref 5–15)
BUN: 33 mg/dL — ABNORMAL HIGH (ref 6–20)
CO2: 28 mmol/L (ref 22–32)
Calcium: 9.1 mg/dL (ref 8.9–10.3)
Chloride: 92 mmol/L — ABNORMAL LOW (ref 98–111)
Creatinine, Ser: 6.42 mg/dL — ABNORMAL HIGH (ref 0.44–1.00)
GFR, Estimated: 7 mL/min — ABNORMAL LOW (ref >60)
Glucose, Bld: 85 mg/dL (ref 70–99)
Potassium: 4.9 mmol/L (ref 3.5–5.1)
Sodium: 137 mmol/L (ref 135–145)

## 2024-10-26 SURGERY — LEFT HEART CATH AND CORONARY ANGIOGRAPHY
Anesthesia: LOCAL

## 2024-10-26 MED ORDER — HYDRALAZINE HCL 20 MG/ML IJ SOLN: 10.0000 mg | INTRAMUSCULAR | Status: DC | PRN

## 2024-10-26 MED ORDER — AMLODIPINE BESYLATE 10 MG PO TABS: 10.0000 mg | ORAL_TABLET | Freq: Every day | ORAL | 1 refills | Status: AC

## 2024-10-26 MED ORDER — SODIUM CHLORIDE 0.9 % IV SOLN: 250.0000 mL | INTRAVENOUS | Status: DC | PRN

## 2024-10-26 MED ORDER — LIDOCAINE HCL (PF) 1 % IJ SOLN
INTRAMUSCULAR | Status: AC
Start: 2024-10-26 — End: 2024-10-26
  Filled 2024-10-26: qty 30

## 2024-10-26 MED ORDER — MIDAZOLAM HCL (PF) 2 MG/2ML IJ SOLN
INTRAMUSCULAR | Status: DC | PRN
  Administered 2024-10-26: 1 mg via INTRAVENOUS

## 2024-10-26 MED ORDER — HEPARIN (PORCINE) IN NACL 1000-0.9 UT/500ML-% IV SOLN
INTRAVENOUS | Status: DC | PRN
  Administered 2024-10-26 (×2): 500 mL

## 2024-10-26 MED ORDER — IOHEXOL 350 MG/ML SOLN
INTRAVENOUS | Status: DC | PRN
  Administered 2024-10-26: 35 mL

## 2024-10-26 MED ORDER — FENTANYL CITRATE (PF) 100 MCG/2ML IJ SOLN
INTRAMUSCULAR | Status: AC
  Filled 2024-10-26: qty 2

## 2024-10-26 MED ORDER — SODIUM CHLORIDE 0.9% FLUSH: 3.0000 mL | INTRAVENOUS | Status: DC | PRN

## 2024-10-26 MED ORDER — FENTANYL CITRATE (PF) 100 MCG/2ML IJ SOLN
INTRAMUSCULAR | Status: DC | PRN
  Administered 2024-10-26: 25 ug via INTRAVENOUS

## 2024-10-26 MED ORDER — SODIUM CHLORIDE 0.9% FLUSH: 3.0000 mL | Freq: Two times a day (BID) | INTRAVENOUS | Status: DC

## 2024-10-26 MED ORDER — MIDAZOLAM HCL 2 MG/2ML IJ SOLN
INTRAMUSCULAR | Status: AC
  Filled 2024-10-26: qty 2

## 2024-10-26 MED ORDER — LABETALOL HCL 5 MG/ML IV SOLN: 10.0000 mg | INTRAVENOUS | Status: DC | PRN

## 2024-10-26 MED ORDER — LIDOCAINE HCL (PF) 1 % IJ SOLN
INTRAMUSCULAR | Status: DC | PRN
  Administered 2024-10-26: 2 mL via INTRADERMAL

## 2024-10-26 SURGICAL SUPPLY — 9 items
CATH INFINITI 5FR MULTPACK ANG (CATHETERS) IMPLANT
CLOSURE MYNX CONTROL 5F (Vascular Products) IMPLANT
GLIDESHEATH SLEND SS 6F .021 (SHEATH) IMPLANT
GUIDEWIRE INQWIRE 1.5J.035X260 (WIRE) IMPLANT
KIT MICROPUNCTURE NIT STIFF (SHEATH) IMPLANT
KIT SYRINGE INJ CVI SPIKEX1 (MISCELLANEOUS) IMPLANT
PACK CARDIAC CATHETERIZATION (CUSTOM PROCEDURE TRAY) ×2 IMPLANT
SET ATX-X65L (MISCELLANEOUS) IMPLANT
SHEATH PINNACLE 5F 10CM (SHEATH) IMPLANT

## 2024-10-26 NOTE — TOC Transition Note (Signed)
 Transition of Care [facility]) - Discharge Note   Patient Details  Name: [name] MRN: [id number] Date of Birth: Oct 13, 1966  [facility] CM/SW Contact:  Waddell Barnie Rama, RN Phone Number: 10/26/2024, 11:35 AM   Clinical Narrative:    For dc today, she has no needs. [name] her SO is here at the bedside for transport.         Patient Goals and CMS Choice            Discharge Placement                       Discharge Plan and Services Additional resources added to the After Visit Summary for                                       Social Drivers of Health (SDOH) Interventions SDOH Screenings   Food Insecurity: No Food Insecurity (10/25/2024)  Housing: Low Risk  (10/25/2024)  Transportation Needs: No Transportation Needs (10/25/2024)  Utilities: Not At Risk (10/25/2024)  Alcohol Screen: Low Risk  (09/27/2023)  Depression (PHQ2-9): Low Risk  (08/21/2024)  Financial Resource Strain: Low Risk  (08/21/2024)  Physical Activity: Inactive (09/27/2023)  Social Connections: Unknown (09/27/2023)  Stress: No Stress Concern Present (08/21/2024)  Tobacco Use: Low Risk  (10/26/2024)  Health Literacy: Adequate Health Literacy (09/27/2023)     Readmission Risk Interventions    10/26/2024   11:30 AM  Readmission Risk Prevention Plan  Post Dischage Appt Complete  Medication Screening Complete  Transportation Screening Complete

## 2024-10-26 NOTE — Progress Notes (Signed)
 Pt came back room 29 from cath lab. Reinitiated tele. Obtained vs. Bed rest started. Call bell within reach.   Amado GORMAN Arabia, RN

## 2024-10-26 NOTE — Interval H&P Note (Signed)
 History and Physical Interval Note:  10/26/2024 7:23 AM  [name]  has presented today for surgery, with the diagnosis of nstemi.  The various methods of treatment have been discussed with the patient and family. After consideration of risks, benefits and other options for treatment, the patient has consented to  Procedure(s): LEFT HEART CATH AND CORONARY ANGIOGRAPHY (N/A) as a surgical intervention.  The patient's history has been reviewed, patient examined, no change in status, stable for surgery.  I have reviewed the patient's chart and labs.  Questions were answered to the patient's satisfaction.    Cath Lab Visit (complete for each Cath Lab visit)  Clinical Evaluation Leading to the Procedure:   ACS: No.  Non-ACS:    Anginal Classification: CCS III  Anti-ischemic medical therapy: Maximal Therapy (2 or more classes of medications)  Non-Invasive Test Results: High-risk stress test findings: cardiac mortality >3%/year  Prior CABG: No previous CABG        [name]

## 2024-10-26 NOTE — Progress Notes (Signed)
  [facility] Progress Note   Subjective: Seen in room. Had urgent dialysis yesterday for hyperkalemia No obstructive CAD on cardiac cath Going home today   Objective Vitals:   10/26/24 0845 10/26/24 0900 10/26/24 0915 10/26/24 0930  BP: (!) 175/83 (!) 161/65 (!) 140/111 (!) 166/82  Pulse:      Resp: 16 14 16 16   Temp:      TempSrc:      SpO2: 97% 99%  95%  Weight:      Height:        Additional Objective Labs: Basic Metabolic Panel: Recent Labs  Lab 10/25/24 0834 10/26/24 0241  NA 135 137  K 5.7* 4.9  CL 93* 92*  CO2 28 28  GLUCOSE 120* 85  BUN 44* 33*  CREATININE 7.74* 6.42*  CALCIUM  8.6* 9.1   CBC: Recent Labs  Lab 10/26/24 0241  WBC 4.1  HGB 12.0  HCT 40.1  MCV 73.4*  PLT 182   Blood Culture    Component Value Date/Time   SDES BLOOD RIGHT HAND 12/24/2018 1308   SPECREQUEST  12/24/2018 1308    BOTTLES DRAWN AEROBIC AND ANAEROBIC Blood Culture adequate volume   CULT  12/24/2018 1308    NO GROWTH 5 DAYS Performed at [facility], [address], [place], [place] [address]    REPTSTATUS 12/29/2018 FINAL 12/24/2018 1308    Physical Exam General: Alert, nad Heart: RRR Lungs: Clear Abdomen: non-tender Extremities: no LE edema  Dialysis Access: L AVF +bruit   Medications:  sodium chloride       irbesartan   150 mg Oral Daily   And   amLODipine   10 mg Oral Daily   aspirin  EC  81 mg Oral Daily   atorvastatin   40 mg Oral Daily   carvedilol   6.25 mg Oral BID WC   Chlorhexidine  Gluconate Cloth  6 each Topical Q0600   gabapentin   200 mg Oral TID   hydrALAZINE   10 mg Oral TID   multivitamin  1 tablet Oral QHS   pantoprazole   40 mg Oral Daily   sevelamer  carbonate  800-1,600 mg Oral TID WC   sodium chloride  flush  3 mL Intravenous Q12H    Dialysis Orders:  [facility] M[facility] 3h  B400   79.5kg  AVF  Heparin  4500 Last OP HD 11/19, post wt 80.0  Assessment/Plan: Hyperkalemia: Resolved with HD  Chest pain: s/p cardiac cath. No  obstructive CAD.  ESRD: on HD MWF. HD Thurs. Will resume OP HD on Sunday d/t holiday schedule.   HTN: bp's stable, ok to resume home BP meds  Volume: no vol excess, usual wt gain is 3 kg.  Anemia of esrd: Hb 11, no esa needs.  Maisie Ronnald Acosta PA-C [facility] 10/26/2024,11:59 AM

## 2024-10-26 NOTE — Care Management Obs Status (Signed)
 MEDICARE OBSERVATION STATUS NOTIFICATION   Patient Details  Name: [name] MRN: [id number] Date of Birth: 08/04/1966   Medicare Observation Status Notification Given:  Yes    Waddell Barnie Rama, RN 10/26/2024, 11:49 AM

## 2024-10-26 NOTE — Progress Notes (Signed)
 D/c orders noted. Contacted pt out-pt hd clinic, [facility] on MWF at 10:50am, to inform of pt d/c and expected arrival back at clinic 10/28/2024 due to holiday schedule. D/c summary has been faxed over. No further assistance needed at this time.    Suzen Satchel Dialysis Navigator [email or web address]

## 2024-10-26 NOTE — Care Management CC44 (Signed)
 Condition Code 44 Documentation Completed  Patient Details  Name: [name] MRN: [id number] Date of Birth: 1966-07-06   Condition Code 44 given:  Yes Patient signature on Condition Code 44 notice:  Yes Documentation of 2 MD's agreement:  Yes Code 44 added to claim:  Yes    Waddell Barnie Rama, RN 10/26/2024, 11:49 AM

## 2024-10-26 NOTE — TOC CM/SW Note (Addendum)
 Transition of Care [facility]) - Inpatient Brief Assessment   Patient Details  Name: LASHAY OSBORNE MRN: [id number] Date of Birth: [birth year]  Transition of Care [facility]) CM/SW Contact:    Waddell Barnie Rama, RN Phone Number: 10/26/2024, 11:31 AM   Clinical Narrative: From home with boyfriend, has PCP and insurance on file, states has no HH services in place at this time,  she has an aide Earlean Cagey, Sat and Sunday at 9:30 to 12:30 has w/chair at home.  States family member (SIL, cousin) will transport them home at [employer] and family is support system, states gets medications from [facility].  Pta w/chair bound.   There are no ICM needs identified  at this time.  Please place consult for ICM needs.     Transition of Care Asessment: Insurance and Status: Insurance coverage has been reviewed Patient has primary care physician: Yes Home environment has been reviewed: home with boyfriend Prior level of function:: w/chair bound Prior/Current Home Services: Current home services (w/chair) Social Drivers of Health Review: SDOH reviewed no interventions necessary Readmission risk has been reviewed: Yes Transition of care needs: no transition of care needs at this time

## 2024-10-26 NOTE — Discharge Summary (Addendum)
 Discharge Summary   Patient ID: [name] MRN: [id number]; DOB: [birth year]  Admit date: 10/25/2024 Discharge date: 10/26/2024  PCP:  Romelle Booty, MD   [facility] Providers Cardiologist:  Wilbert Bihari, MD     Discharge Diagnoses  Principal Problem:   Hyperkalemia Active Problems:   Hypertension   Diabetic peripheral neuropathy (HCC)   ESRD on dialysis [facility])   Chest pain of uncertain etiology   Diagnostic Studies/Procedures   Coronary angiogram 10/26/2024  No angiographic evidence of CAD LVEDP 18 mmHg    _____________   History of Present Illness   [name] is a 58 y.o. female with history of CAD, HTN, HLD, DM2, and ESRD on HD complicated by recurrent hyperkalemia, presenting for outpatient catheterization for evaluation of atypical chest pain and abnormal stress test. Myocardial PET/CT was performed last month, showing reversible anterior, anterolateral, and anteroseptal defect concerning for ischemia. Study was felt to be high risk. Patient denied any further frank chest pain but had intermittent palpitations. She was noted to have significant hyperkalemia on precatheterization labs with a potassium of 6.1. She was counseled on minimizing her potassium intake and has been compliant with her hemodialysis schedule (last session yesterday). Repeat labs later that morning show potassium was still elevated at 5.7. She admitted and seen by nephrology for HD session.    Hospital Course    Chest pain with recent abnormal stress test  -- Underwent outpatient cardiac PET stress 09/2024 which showed a medium defect with moderate reduction in uptake in the apical to mid anterior lateral locations, reversible consistent with ischemia with an EF of 43%.  She was set up for outpatient cardiac catheterization, initially noted to be hyperkalemic therefore this was deferred to the following day.  Underwent cardiac catheterization 11/21 with no significant CAD. --  Recommendations for continued medical therapy  Hyperkalemia -- Noted to be hyperkalemic with a potassium of 6.1 on arrival for her outpatient cardiac cath. --Nephrology consulted and underwent dialysis.  Potassium of 4.9 on the day of discharge. -- Of note she has been on a combination of olmesartan  and amlodipine  prior to admission.  Will stop olmesartan  at discharge and continue amlodipine  at 10 mg daily  Hypertension --As noted above stopping ARB, continue amlodipine  10 mg daily  Hyperlipidemia --Continue Lipitor  Type 2 diabetes --Hemoglobin A1c 5.8  ESRD on HD --On Monday Wednesday Friday schedule, seen by nephrology and dialyzed yesterday  Patient was seen by myself and Dr. Ladona and deemed stable for discharge home.  Follow-up arranged with office.  Instructions given related to cath site prior to discharge.    Did the patient have an acute coronary syndrome (MI, NSTEMI, STEMI, etc) this admission?:  No                               Did the patient have a percutaneous coronary intervention (stent / angioplasty)?:  No.    _____________  Discharge Vitals Blood pressure (!) 166/82, pulse 80, temperature 97.8 F (36.6 C), temperature source Oral, resp. rate 16, height 5' 7 (1.702 m), weight 78 kg, last menstrual period 03/07/2019, SpO2 95%.  Filed Weights   10/25/24 0817 10/25/24 1353 10/25/24 1809  Weight: 80.7 kg 80.7 kg 78 kg   Physical Exam Neck:     Vascular: No carotid bruit or JVD.  Cardiovascular:     Rate and Rhythm: Normal rate and regular rhythm.     Pulses:  Dorsalis pedis pulses are 0 on the right side and 0 on the left side.       Posterior tibial pulses are 0 on the right side and 0 on the left side.     Heart sounds: Normal heart sounds. No murmur heard.    No gallop.     Comments: Left forearm AV fistula noted. Right groin site without any hematoma or complications from the procedure. Pulmonary:     Effort: Pulmonary effort is normal.      Breath sounds: Normal breath sounds.  Abdominal:     General: Bowel sounds are normal.     Palpations: Abdomen is soft.  Musculoskeletal:     Right lower leg: No edema.     Left lower leg: No edema.     Labs & Radiologic Studies  CBC Recent Labs    10/26/24 0241  WBC 4.1  HGB 12.0  HCT 40.1  MCV 73.4*  PLT 182   Basic Metabolic Panel Recent Labs    88/79/74 0834 10/26/24 0241  NA 135 137  K 5.7* 4.9  CL 93* 92*  CO2 28 28  GLUCOSE 120* 85  BUN 44* 33*  CREATININE 7.74* 6.42*  CALCIUM  8.6* 9.1   Disposition Pt is being discharged home today in good condition.  Follow-up Plans & Appointments  Follow-up Information     Romelle Booty, MD. Call in 1 week(s).   Specialty: Family Medicine Contact information: [address] [place] [address] [phone]                Discharge Instructions     Diet - low sodium heart healthy   Complete by: As directed    Discharge instructions   Complete by: As directed    Groin Site Care Refer to this sheet in the next few weeks. These instructions provide you with information on caring for yourself after your procedure. Your caregiver may also give you more specific instructions. Your treatment has been planned according to current medical practices, but problems sometimes occur. Call your caregiver if you have any problems or questions after your procedure. HOME CARE INSTRUCTIONS You may shower 24 hours after the procedure. Remove the bandage (dressing) and gently wash the site with plain soap and water . Gently pat the site dry.  Do not apply powder or lotion to the site.  Do not sit in a bathtub, swimming pool, or whirlpool for 5 to 7 days.  No bending, squatting, or lifting anything over 10 pounds (4.5 kg) as directed by your caregiver.  Inspect the site at least twice daily.  Do not drive home if you are discharged the same day of the procedure. Have someone else drive you.  You may drive 24 hours after the  procedure unless otherwise instructed by your caregiver.  What to expect: Any bruising will usually fade within 1 to 2 weeks.  Blood that collects in the tissue (hematoma) may be painful to the touch. It should usually decrease in size and tenderness within 1 to 2 weeks.  SEEK IMMEDIATE MEDICAL CARE IF: You have unusual pain at the groin site or down the affected leg.  You have redness, warmth, swelling, or pain at the groin site.  You have drainage (other than a small amount of blood on the dressing).  You have chills.  You have a fever or persistent symptoms for more than 72 hours.  You have a fever and your symptoms suddenly get worse.  Your leg becomes  pale, cool, tingly, or numb.  You have heavy bleeding from the site. Hold pressure on the site. .   Increase activity slowly   Complete by: As directed    No wound care   Complete by: As directed        Discharge Medications Allergies as of 10/26/2024       Reactions   Shrimp Extract Nausea And Vomiting, Swelling, Other (See Comments)   Patient ended up in the ED after eating this- left eye is puffy        Medication List     STOP taking these medications    amlodipine -olmesartan  10-20 MG tablet Commonly known as: AZOR        TAKE these medications    Accu-Chek Guide test strip Generic drug: glucose blood User to check blood sugar up to 4x per day e11.9   Adult Blood Pressure Cuff Lg Kit 1 each by Does not apply route daily.   amLODipine  10 MG tablet Commonly known as: NORVASC  Take 1 tablet (10 mg total) by mouth daily. Start taking on: October 27, 2024   aspirin  EC 81 MG tablet Take 1 tablet (81 mg total) by mouth daily. Swallow whole.   atorvastatin  40 MG tablet Commonly known as: LIPITOR TAKE 1 TABLET (40 MG TOTAL) BY MOUTH DAILY.   carvedilol  6.25 MG tablet Commonly known as: COREG  Take 6.25 mg by mouth 2 (two) times daily with a meal.   Dialyvite  800 0.8 MG Tabs Take 1 tablet by mouth  daily.   EPINEPHrine  0.3 mg/0.3 mL Soaj injection Commonly known as: EPI-PEN Inject 0.3 mg into the muscle as needed for anaphylaxis.   ferric citrate  1 GM 210 MG(Fe) tablet Commonly known as: AURYXIA    gabapentin  100 MG capsule Commonly known as: NEURONTIN  TAKE 2 CAPSULES (200 MG TOTAL) BY MOUTH 3 (THREE) TIMES DAILY.   hydrALAZINE  10 MG tablet Commonly known as: APRESOLINE  Take 1 tablet (10 mg total) by mouth 3 (three) times daily.   LOPERAMIDE HCL PO Take by mouth as needed.   omeprazole  40 MG capsule Commonly known as: PRILOSEC TAKE 1 CAPSULE (40 MG TOTAL) BY MOUTH DAILY.   prednisoLONE acetate 1 % ophthalmic suspension Commonly known as: PRED FORTE SMARTSIG:1 Drop(s) Left Eye Daily PRN   sevelamer  carbonate 800 MG tablet Commonly known as: RENVELA  Take 800-1,600 mg by mouth 3 (three) times daily with meals. Take 1,600 mg by mouth three times a day with meals and 800 mg with snacks   Shingrix  injection Generic drug: Zoster Vaccine Adjuvanted Administer Shingrix  vaccination now and repeat in two months   TYLENOL  500 MG tablet Generic drug: acetaminophen  Take 500-1,000 mg by mouth every 6 (six) hours as needed for mild pain (pain score 1-3) or headache.         Outstanding Labs/Studies  N/a   Duration of Discharge Encounter: APP Time: 20 minutes   Signed, Gordy Bergamo, MD 10/26/2024, 4:58 PM

## 2024-10-26 NOTE — Plan of Care (Signed)
 [facility] Patient Discharge Orders- [facility] CLINIC: [place]   Patient's name: [name] Admit/DC Dates: 10/25/2024 - 10/26/2024  Discharge Diagnoses:  Hyperkalemia. Resolved with HD  Chest pain s/p cardiac cath   Outpatient Dialysis Orders:  -Heparin : No change -EDW No change   -Bath: No change   Anemia Aranesp : Given: --   Date of last dose/amount: --   PRBC's Given: -- Date/# of units: -- ESA dose for discharge: none   Recent Labs  Lab 10/26/24 0241  HGB 12.0  K 4.9  CALCIUM  9.1    Access intervention/Change: none    Medications: -IV Antibiotics: none    OTHER/APPTS/LABS   Completed by: Maisie Ronnald Acosta PA-C   D/C Meds to be reconciled by nurse after every discharge.    Reviewed by: MD:______ RN_______

## 2024-10-26 NOTE — Progress Notes (Signed)
 Discharge   Patient expressed verbal understanding of discharge POC.   Patient given time to ask any questions.  Additional education included in AVS.  Alert oriented in good spirits.   Tele and PIV removed.  Pressure dressings intact.  CCMD [place].   Pt has right BKA and visual disturbances.  Ride on the way.

## 2024-10-27 ENCOUNTER — Encounter (HOSPITAL_COMMUNITY): Payer: Self-pay | Admitting: Cardiovascular Disease

## 2024-10-27 LAB — LIPOPROTEIN A (LPA): Lipoprotein (a): 34.7 nmol/L — ABNORMAL HIGH (ref <75.0)

## 2024-10-29 ENCOUNTER — Telehealth: Payer: Self-pay | Admitting: Physician Assistant

## 2024-10-29 NOTE — Telephone Encounter (Signed)
 Transition of Care - Initial Contact after Hospitalization  Date of discharge:  10/26/24 Date of contact: 10/29/24  Method: Phone Spoke to: Patient  Patient contacted to discuss transition of care from recent inpatient hospitalization. Patient was admitted to [facility] from 10/25/24 to 10/26/24 discharge diagnosis of chest pain and hyperkalemia.   The discharge medication list was reviewed. Patient understands the changes and has no concerns. She wants to know if she can change her dressing- advised to call the nurse line at her cardiologist to confirm  Patient will return to his/her outpatient HD unit on: 10/30/24 due to holiday schedule, did go to dialysis yesterday.  No other concerns at this time.  Lucie Collet, PA-C 10/29/2024, 10:02 AM  [facility] Pager: [phone]

## 2024-11-12 ENCOUNTER — Ambulatory Visit: Attending: Nurse Practitioner | Admitting: Nurse Practitioner

## 2024-11-12 NOTE — Progress Notes (Deleted)
 Office Visit    Patient Name: [name] Date of Encounter: 11/12/2024  Primary Care Provider:  Romelle Booty, MD Primary Cardiologist:  Wilbert Bihari, MD  Chief Complaint    58 year old female with a history of CAD, hypertension, hyperlipidemia, type 2 diabetes, ESRD on HD, prior alcohol dependency, and blindness in left eye who presents for follow-up related to CAD.   Past Medical History    Past Medical History:  Diagnosis Date   Alcohol dependency (HCC)    Anemia    Appendicitis 12/06/2018   Chronic kidney disease    Diabetes mellitus    type 2   Diabetes mellitus type 2, uncontrolled    End stage renal disease (HCC) 05/05/2016   Starting 04/2016, LUE fistula   Enterococcal bacteremia    Hypertension    Legally blind in left eye, as defined in [place]     Liver abscess 12/20/2018   Necrotizing fasciitis (HCC)    PONV (postoperative nausea and vomiting)    Pulmonary HTN (HCC)    mild with PASP on echo 07/2024   Sepsis without acute organ dysfunction (HCC)    Type 2 diabetes mellitus with diabetic chronic kidney disease (HCC) 05/05/2016   Past Surgical History:  Procedure Laterality Date   A/V FISTULAGRAM N/A 02/09/2024   Procedure: A/V Fistulagram;  Surgeon: Melia Lynwood ORN, MD;  Location: [facility];  Service: Cardiovascular;  Laterality: N/A;   A/V FISTULAGRAM Left 10/11/2024   Procedure: A/V Fistulagram;  Surgeon: Sheree Penne Bruckner, MD;  Location: [facility];  Service: Cardiovascular;  Laterality: Left;   AMPUTATION     right first and left middle finger amputation 2/2 OM 08/2011 by Dr. Camella   AMPUTATION  01/18/2012   Procedure: AMPUTATION DIGIT;  Surgeon: Franky JONELLE Curia, MD;  Location: [facility];  Service: Orthopedics;  Laterality: Left;  revision amputation left thumb   AMPUTATION  08/25/2012   Procedure: AMPUTATION BELOW KNEE;  Surgeon: Jerona LULLA Sage, MD;  Location: [facility];  Service: Orthopedics;  Laterality: Right;  Right  Below Knee Amputation   APPENDECTOMY     BASCILIC VEIN TRANSPOSITION Left 07/22/2015   Procedure: BASILIC VEIN TRANSPOSITION ;  Surgeon: Carlin FORBES Haddock, MD;  Location: [facility];  Service: Vascular;  Laterality: Left;   CATARACT EXTRACTION     right    CESAREAN SECTION  1986   CESAREAN SECTION  1985   CESAREAN SECTION  1991   COLONOSCOPY WITH PROPOFOL  N/A 08/25/2023   Procedure: COLONOSCOPY WITH PROPOFOL ;  Surgeon: Aneita Gwendlyn DASEN, MD;  Location: [facility];  Service: Gastroenterology;  Laterality: N/A;   I & D EXTREMITY  12/24/2011   Procedure: IRRIGATION AND DEBRIDEMENT EXTREMITY;  Surgeon: Franky JONELLE Curia, MD;  Location: [facility];  Service: Orthopedics;  Laterality: Left;  I&Dleft thumb with partial amputation   I & D EXTREMITY  12/26/2011   Procedure: IRRIGATION AND DEBRIDEMENT EXTREMITY;  Surgeon: Franky JONELLE Curia, MD;  Location: [facility];  Service: Orthopedics;  Laterality: Left;   I & D EXTREMITY  12/23/2011   Procedure: IRRIGATION AND DEBRIDEMENT EXTREMITY;  Surgeon: Franky JONELLE Curia, MD;  Location: [facility];  Service: Orthopedics;  Laterality: Left;  I&D Left thumb, hand and arm   I&D left thumb  12/23/2011   IR RADIOLOGIST EVAL & MGMT  01/04/2019   IR RADIOLOGIST EVAL & MGMT  01/25/2019   LAPAROSCOPIC APPENDECTOMY N/A 12/06/2018   Procedure: APPENDECTOMY LAPAROSCOPIC;  Surgeon: Stevie Herlene Righter, MD;  Location: [facility];  Service: General;  Laterality: N/A;   LEFT HEART CATH AND CORONARY ANGIOGRAPHY N/A 10/26/2024   Procedure: LEFT HEART CATH AND CORONARY ANGIOGRAPHY;  Surgeon: Verlin Lonni BIRCH, MD;  Location: [facility];  Service: Cardiovascular;  Laterality: N/A;   POLYPECTOMY  08/25/2023   Procedure: POLYPECTOMY;  Surgeon: Aneita Gwendlyn DASEN, MD;  Location: [facility];  Service: Gastroenterology;;   TUBAL LIGATION      Allergies  Allergies  Allergen Reactions   Shrimp Extract Nausea And Vomiting, Swelling and Other (See Comments)    Patient ended up in the ED after  eating this- left eye is puffy     Labs/Other Studies Reviewed    The following studies were reviewed today:  Cardiac Studies & Procedures   ______________________________________________________________________________________________ CARDIAC CATHETERIZATION  CARDIAC CATHETERIZATION 10/26/2024  Conclusion No angiographic evidence of CAD LVEDP 18 mmHg  No further ischemia workup  Findings Coronary Findings Diagnostic  Dominance: Right  Left Anterior Descending Vessel is large.  Left Circumflex Vessel is large.  Right Coronary Artery Vessel is large.  Intervention  No interventions have been documented.   STRESS TESTS  NM PET CT CARDIAC PERFUSION MULTI W/ABSOLUTE BLOODFLOW 10/02/2024  Narrative   Findings are consistent with LAD ischemia  Technically challenging study; though flow reserve with a perfusion defect is a high risk feature, this can be less accurate in the ESRD population.  Ultimately, with decreased LVEF and the above, the study is high risk.   LV perfusion is abnormal. Defect 1: There is a medium defect with moderate reduction in uptake present in the apical to mid anterior location(s) that is reversible. There is normal wall motion in the defect area. Consistent with ischemia.   Rest left ventricular function is abnormal. Rest EF: 43%. Stress left ventricular function is normal. Stress EF: 53%. End diastolic cavity size is mildly enlarged. End systolic cavity size is normal.   Myocardial blood flow was computed to be 1.40ml/g/min at rest and 1.26ml/g/min at stress. Global myocardial blood flow reserve was 1.21 and was highly abnormal.   Coronary calcium  was present on the attenuation correction CT images. Moderate coronary calcifications were present. Coronary calcifications were present in the left anterior descending artery, left circumflex artery and right coronary artery distribution(s). Aortic atherosclerosis.  Dilation of the pulmonary artery on non  contrast imaging.   Electronically Signed  By: Stanly Leavens M.D.  CLINICAL DATA:  This over-read does not include interpretation of cardiac or coronary anatomy or pathology. No interpretation the PET data set. The cardiac PET-CT interpretation by the cardiologist is attached.  COMPARISON:  None Available.  FINDINGS: Limited view of the lung parenchyma demonstrates no suspicious nodularity. Airways are normal.  Limited view of the mediastinum demonstrates no adenopathy. Esophagus normal.  Limited view of the upper abdomen unremarkable.  Limited view of the skeleton and chest wall is unremarkable.  IMPRESSION: No significant extracardiac findings.   Electronically Signed By: Jackquline Boxer M.D. On: 10/02/2024 16:45   ECHOCARDIOGRAM  ECHOCARDIOGRAM COMPLETE 08/02/2024  Narrative ECHOCARDIOGRAM REPORT    Patient Name:   SAPNA PADRON Date of Exam: 08/02/2024 Medical Rec #:  [id number]       Height:       67.0 in Accession #:    [id number]      Weight:       179.8 lb Date of Birth:  [birth year]      BSA:          1.933 m  Patient Age:    57 years        BP:           186/89 mmHg Patient Gender: F               HR:           76 bpm. Exam Location:  [address]  Procedure: 2D Echo, 3D Echo and Strain Analysis (Both Spectral and Color Flow Doppler were utilized during procedure).  Indications:    R07.9* Chest pain, unspecified  History:        Patient has prior history of Echocardiogram examinations, most recent 01/08/2012. Risk Factors:Hypertension, Diabetes and Dyslipidemia. ESRD. Legally blind. Dizziness.  Sonographer:    Jon Hacker RCS Referring Phys: [phone] TRACI R TURNER  IMPRESSIONS   1. Left ventricular ejection fraction, by estimation, is 60 to 65%. Left ventricular ejection fraction by 3D volume is 62 %. The left ventricle has normal function. The left ventricle has no regional wall motion abnormalities. Left ventricular diastolic parameters  are indeterminate. Elevated left atrial pressure. The average left ventricular global longitudinal strain is -19.4 %. The global longitudinal strain is normal. 2. Right ventricular systolic function is normal. The right ventricular size is normal. There is mildly elevated pulmonary artery systolic pressure. The estimated right ventricular systolic pressure is 43.2 mmHg. 3. The mitral valve is normal in structure. Trivial mitral valve regurgitation. No evidence of mitral stenosis. 4. The aortic valve is tricuspid. Aortic valve regurgitation is not visualized. Aortic valve sclerosis/calcification is present, without any evidence of aortic stenosis. 5. The inferior vena cava is normal in size with greater than 50% respiratory variability, suggesting right atrial pressure of 3 mmHg.  FINDINGS Left Ventricle: Left ventricular ejection fraction, by estimation, is 60 to 65%. Left ventricular ejection fraction by 3D volume is 62 %. The left ventricle has normal function. The left ventricle has no regional wall motion abnormalities. The average left ventricular global longitudinal strain is -19.4 %. Strain was performed and the global longitudinal strain is normal. The left ventricular internal cavity size was normal in size. There is no left ventricular hypertrophy. Left ventricular diastolic parameters are indeterminate. Elevated left atrial pressure.  Right Ventricle: The right ventricular size is normal. No increase in right ventricular wall thickness. Right ventricular systolic function is normal. There is mildly elevated pulmonary artery systolic pressure. The tricuspid regurgitant velocity is 3.17 m/s, and with an assumed right atrial pressure of 3 mmHg, the estimated right ventricular systolic pressure is 43.2 mmHg.  Left Atrium: Left atrial size was normal in size.  Right Atrium: Right atrial size was normal in size.  Pericardium: There is no evidence of pericardial effusion.  Mitral Valve: The  mitral valve is normal in structure. Trivial mitral valve regurgitation. No evidence of mitral valve stenosis.  Tricuspid Valve: The tricuspid valve is normal in structure. Tricuspid valve regurgitation is mild . No evidence of tricuspid stenosis.  Aortic Valve: The aortic valve is tricuspid. Aortic valve regurgitation is not visualized. Aortic valve sclerosis/calcification is present, without any evidence of aortic stenosis.  Pulmonic Valve: The pulmonic valve was normal in structure. Pulmonic valve regurgitation is trivial. No evidence of pulmonic stenosis.  Aorta: The aortic root is normal in size and structure.  Venous: The inferior vena cava is normal in size with greater than 50% respiratory variability, suggesting right atrial pressure of 3 mmHg.  IAS/Shunts: No atrial level shunt detected by color flow Doppler.  Additional Comments: 3D was performed not  requiring image post processing on an independent workstation and was normal.   LEFT VENTRICLE PLAX 2D LVIDd:         4.79 cm         Diastology LVIDs:         3.38 cm         LV e' medial:    5.55 cm/s LV PW:         1.11 cm         LV E/e' medial:  20.7 LV IVS:        0.96 cm         LV e' lateral:   11.40 cm/s LVOT diam:     2.00 cm         LV E/e' lateral: 10.1 LV SV:         83 LV SV Index:   43              2D Longitudinal LVOT Area:     3.14 cm        Strain 2D Strain GLS   -22.4 % (A4C): 2D Strain GLS   -18.0 % (A3C): 2D Strain GLS   -17.7 % (A2C): 2D Strain GLS   -19.4 % Avg:  3D Volume EF LV 3D EF:    Left ventricul ar ejection fraction by 3D volume is 62 %.  3D Volume EF: 3D EF:        62 % LV EDV:       202 ml LV ESV:       78 ml LV SV:        125 ml  RIGHT VENTRICLE RV Basal diam:  3.14 cm RV S prime:     10.30 cm/s TAPSE (M-mode): 2.4 cm RVSP:           43.2 mmHg  LEFT ATRIUM             Index        RIGHT ATRIUM           Index LA diam:        3.20 cm 1.66 cm/m   RA Pressure: 3.00  mmHg LA Vol (A2C):   50.9 ml 26.34 ml/m  RA Area:     18.00 cm LA Vol (A4C):   44.4 ml 22.97 ml/m  RA Volume:   49.80 ml  25.77 ml/m LA Biplane Vol: 50.9 ml 26.34 ml/m AORTIC VALVE LVOT Vmax:   96.60 cm/s LVOT Vmean:  65.400 cm/s LVOT VTI:    0.264 m  AORTA Ao Root diam: 2.60 cm Ao Asc diam:  3.20 cm  MITRAL VALVE                TRICUSPID VALVE MV Area (PHT): 4.83 cm     TR Peak grad:   40.2 mmHg MV Decel Time: 157 msec     TR Vmax:        317.00 cm/s MV E velocity: 115.00 cm/s  Estimated RAP:  3.00 mmHg MV A velocity: 107.00 cm/s  RVSP:           43.2 mmHg MV E/A ratio:  1.07 SHUNTS Systemic VTI:  0.26 m Systemic Diam: 2.00 cm  Wilbert Bihari MD Electronically signed by Wilbert Bihari MD Signature Date/Time: 08/02/2024/4:21:46 PM    Final          ______________________________________________________________________________________________     Recent Labs: 10/18/2024: ALT 6 10/26/2024: BUN 33; Creatinine, Ser 6.42; Hemoglobin 12.0; Platelets 182; Potassium 4.9;  Sodium 137  Recent Lipid Panel    Component Value Date/Time   CHOL 106 11/22/2023 1453   TRIG 64 11/22/2023 1453   HDL 51 11/22/2023 1453   CHOLHDL 2.1 11/22/2023 1453   CHOLHDL 3.2 08/25/2012 0530   VLDL 33 08/25/2012 0530   LDLCALC 41 11/22/2023 1453    History of Present Illness    58 year old female with the above past medical history including CAD, hypertension, hyperlipidemia, type 2 diabetes, ESRD on HD, prior alcohol dependency, and blindness in left eye.   She was referred to cardiology in the setting of chest discomfort.  Echocardiogram in 07/2024 showed EF 65%, normal LV function, no RWMA, normal RV systolic function, mildly elevated PASP, no significant valvular abnormalities. Cardiac PET stress in 09/2024 showed medium defect with moderate reduction in uptake present in the apical to mid anterior locations, reversible, consistent with ischemia, EF 43% moderate coronary calcification in  the LAD, LCx, RCA.  Follow-up cardiac catheterization was recommended per Dr. Shlomo   She was last seen in office on 10/18/2024 and was stable from a cardiac standpoint.  She denies symptoms concerning for angina.  Cardiac catheterization on 10/26/2024 which revealed no significant CAD.  She was noted to be hyperkalemic.  Nephrology was consulted.  She underwent dialysis.  Potassium was 4.9 on discharge.   She presents today for follow-up. Since her last visit she has  1. CAD: Cardiac PET stress in 09/2024 showed medium defect with moderate reduction in uptake present in the apical to mid anterior locations, reversible, consistent with ischemia, EF 43% moderate coronary calcification in the LAD, LCx, RCA.  Stable with no anginal symptoms.  Once again reviewed recommendations for LHC with Dr. Shlomo, who recommends proceeding with LHC given abnormal stress test.  Patient is agreeable to proceed.  Will check CBC, CMET today.  LHC scheduled for 10/25/2024 with Dr. Mady. Will notify her nephrologist, Dr. Dalene, as well as [facility] of plans for upcoming cath.  Of note, she is no longer on insulin .  Continue aspirin , carvedilol , hydralazine , amlodipine -losartan , Lipitor.    2. Hypertension: BP well controlled. Continue current antihypertensive regimen.    3. Hyperlipidemia: LDL was 41 in 11/2023.  She will ultimately require updated fasting lipids, LP(a), will defer she is not fasting at this time. Continue Lipitor.   4. Type 2 diabetes: A1c was 5.8 in 08/2024.  Monitored and managed per PCP.   5. ESRD on HD: On HD MWF, Followed by nephrology.    6. Disposition: Follow-up   Home Medications    Current Outpatient Medications  Medication Sig Dispense Refill   amLODipine  (NORVASC ) 10 MG tablet Take 1 tablet (10 mg total) by mouth daily. 90 tablet 1   aspirin  EC 81 MG tablet Take 1 tablet (81 mg total) by mouth daily. Swallow whole.     atorvastatin  (LIPITOR) 40 MG tablet TAKE 1  TABLET (40 MG TOTAL) BY MOUTH DAILY. 90 tablet 0   B Complex-C-Folic Acid  (DIALYVITE  800) 0.8 MG TABS Take 1 tablet by mouth daily.  11   Blood Pressure Monitoring (ADULT BLOOD PRESSURE CUFF LG) KIT 1 each by Does not apply route daily. 1 kit 0   carvedilol  (COREG ) 6.25 MG tablet Take 6.25 mg by mouth 2 (two) times daily with a meal.     EPINEPHrine  0.3 mg/0.3 mL IJ SOAJ injection Inject 0.3 mg into the muscle as needed for anaphylaxis. 1 each 0   ferric citrate  (AURYXIA ) 1 GM 210 MG(Fe) tablet  gabapentin  (NEURONTIN ) 100 MG capsule TAKE 2 CAPSULES (200 MG TOTAL) BY MOUTH 3 (THREE) TIMES DAILY. 540 capsule 0   glucose blood (ACCU-CHEK GUIDE) test strip User to check blood sugar up to 4x per day e11.9 100 each 12   hydrALAZINE  (APRESOLINE ) 10 MG tablet Take 1 tablet (10 mg total) by mouth 3 (three) times daily. 270 tablet 0   LOPERAMIDE HCL PO Take by mouth as needed.     omeprazole  (PRILOSEC) 40 MG capsule TAKE 1 CAPSULE (40 MG TOTAL) BY MOUTH DAILY. 90 capsule 0   prednisoLONE acetate (PRED FORTE) 1 % ophthalmic suspension SMARTSIG:1 Drop(s) Left Eye Daily PRN     sevelamer  carbonate (RENVELA ) 800 MG tablet Take 800-1,600 mg by mouth 3 (three) times daily with meals. Take 1,600 mg by mouth three times a day with meals and 800 mg with snacks     TYLENOL  500 MG tablet Take 500-1,000 mg by mouth every 6 (six) hours as needed for mild pain (pain score 1-3) or headache.     Zoster Vaccine Adjuvanted (SHINGRIX ) injection Administer Shingrix  vaccination now and repeat in two months 1 each 1   No current facility-administered medications for this visit.     Review of Systems    ***.  All other systems reviewed and are otherwise negative except as noted above.    Physical Exam    VS:  LMP 03/07/2019  , BMI There is no height or weight on file to calculate BMI. STOP-Bang Score:  3  { Consider Dx Sleep Disordered Breathing or Sleep Apnea  ICD G47.33          :1}    GEN: Well nourished, well  developed, in no acute distress. HEENT: normal. Neck: Supple, no JVD, carotid bruits, or masses. Cardiac: RRR, no murmurs, rubs, or gallops. No clubbing, cyanosis, edema.  Radials/DP/PT 2+ and equal bilaterally.  Respiratory:  Respirations regular and unlabored, clear to auscultation bilaterally. GI: Soft, nontender, nondistended, BS + x 4. MS: no deformity or atrophy. Skin: warm and dry, no rash. Neuro:  Strength and sensation are intact. Psych: Normal affect.  Accessory Clinical Findings    ECG personally reviewed by me today -    - no acute changes.   Lab Results  Component Value Date   WBC 4.1 10/26/2024   HGB 12.0 10/26/2024   HCT 40.1 10/26/2024   MCV 73.4 (L) 10/26/2024   PLT 182 10/26/2024   Lab Results  Component Value Date   CREATININE 6.42 (H) 10/26/2024   BUN 33 (H) 10/26/2024   NA 137 10/26/2024   K 4.9 10/26/2024   CL 92 (L) 10/26/2024   CO2 28 10/26/2024   Lab Results  Component Value Date   ALT 6 10/18/2024   AST 17 10/18/2024   ALKPHOS 84 10/18/2024   BILITOT 0.4 10/18/2024   Lab Results  Component Value Date   CHOL 106 11/22/2023   HDL 51 11/22/2023   LDLCALC 41 11/22/2023   TRIG 64 11/22/2023   CHOLHDL 2.1 11/22/2023    Lab Results  Component Value Date   HGBA1C 5.8 08/28/2024    Assessment & Plan    1.  ***  No BP recorded.  {Refresh Note OR Click here to enter BP  :1}***   Damien JAYSON Braver, NP 11/12/2024, 6:18 AM

## 2024-11-13 ENCOUNTER — Other Ambulatory Visit: Payer: Self-pay | Admitting: Family Medicine

## 2024-11-13 DIAGNOSIS — E785 Hyperlipidemia, unspecified: Secondary | ICD-10-CM

## 2024-11-15 ENCOUNTER — Ambulatory Visit

## 2024-11-15 VITALS — Ht 67.0 in | Wt 179.0 lb

## 2024-11-15 DIAGNOSIS — Z0000 Encounter for general adult medical examination without abnormal findings: Secondary | ICD-10-CM | POA: Diagnosis not present

## 2024-11-15 NOTE — Patient Instructions (Signed)
 Ms. Mascaro,  Thank you for taking the time for your Medicare Wellness Visit. I appreciate your continued commitment to your health goals. Please review the care plan we discussed, and feel free to reach out if I can assist you further.  Please note that Annual Wellness Visits do not include a physical exam. Some assessments may be limited, especially if the visit was conducted virtually. If needed, we may recommend an in-person follow-up with your provider.  Ongoing Care Seeing your primary care provider every 3 to 6 months helps us  monitor your health and provide consistent, personalized care.   Referrals If a referral was made during today's visit and you haven't received any updates within two weeks, please contact the referred provider directly to check on the status.  Recommended Screenings:  Health Maintenance  Topic Date Due   Zoster (Shingles) Vaccine (1 of 2) Never done   Eye exam for diabetics  10/20/2019   COVID-19 Vaccine (4 - 2025-26 season) 08/06/2024   Medicare Annual Wellness Visit  09/26/2024   Lipid (cholesterol) test  11/21/2024   Complete foot exam   01/16/2025   Breast Cancer Screening  11/09/2025   Pneumococcal Vaccine for age over 25 (4 of 4 - PCV20 or PCV21) 11/19/2025   Pap with HPV screening  01/19/2027   DTaP/Tdap/Td vaccine (2 - Td or Tdap) 09/09/2027   Colon Cancer Screening  08/24/2033   Flu Shot  Completed   Hepatitis B Vaccine  Completed   Hepatitis C Screening  Completed   HIV Screening  Completed   HPV Vaccine  Aged Out   Meningitis B Vaccine  Aged Out       11/15/2024   11:58 AM  Advanced Directives  Does Patient Have a Medical Advance Directive? No  Would patient like information on creating a medical advance directive? No - Patient declined    Vision: Annual vision screenings are recommended for early detection of glaucoma, cataracts, and diabetic retinopathy. These exams can also reveal signs of chronic conditions such as diabetes and  high blood pressure.  Dental: Annual dental screenings help detect early signs of oral cancer, gum disease, and other conditions linked to overall health, including heart disease and diabetes.  Please see the attached documents for additional preventive care recommendations.

## 2024-11-15 NOTE — Progress Notes (Signed)
 Chief Complaint  Patient presents with   Medicare Wellness    SUBSEQUENT     Subjective:   [name] is a 58 y.o. female who presents for a Medicare Annual Wellness Visit.  Visit info / Clinical Intake: Medicare Wellness Visit Type:: Subsequent Annual Wellness Visit Persons participating in visit and providing information:: patient Medicare Wellness Visit Mode:: Telephone If telephone:: video declined Since this visit was completed virtually, some vitals may be partially provided or unavailable. Missing vitals are due to the limitations of the virtual format.: Documented vitals are patient reported If Telephone or Video please confirm:: I connected with patient using audio/video enable telemedicine. I verified patient identity with two identifiers, discussed telehealth limitations, and patient agreed to proceed. Patient Location:: [facility] ([facility]PT.) Provider Location:: HOME OFFICE Interpreter Needed?: No Pre-visit prep was completed: yes AWV questionnaire completed by patient prior to visit?: no Living arrangements:: lives with spouse/significant other Patient's Overall Health Status Rating: good Typical amount of pain: none Does pain affect daily life?: no Are you currently prescribed opioids?: no  Dietary Habits and Nutritional Risks How many meals a day?: 3 Eats fruit and vegetables daily?: yes Most meals are obtained by: preparing own meals In the last 2 weeks, have you had any of the following?: none Diabetic:: no (PREDIABETIC PER PATIENT)  Functional Status Activities of Daily Living (to include ambulation/medication): (!) Needs Assist Feeding: Independent Dressing/Grooming: Independent Bathing: Independent Toileting: Independent Transfer: Independent Ambulation: Independent with device- listed below Home Assistive Devices/Equipment: Wheelchair; Shower/tub chair; Other (Comment); Communication device (specify type); Elevated toliet seat (LIFE  ALERT, GRAB BARS) Medication Administration: Independent Home Management (perform basic housework or laundry): Needs assistance (comment) Manage your own finances?: yes Primary transportation is: facility / other ([facility]) Concerns about vision?: no *vision screening is required for WTM* Concerns about hearing?: no  Fall Screening Falls in the past year?: 0 Number of falls in past year: 0 Was there an injury with Fall?: 0 Fall Risk Category Calculator: 0 Patient Fall Risk Level: Low Fall Risk  Fall Risk Patient at Risk for Falls Due to: No Fall Risks; Impaired vision; Impaired balance/gait; Impaired mobility Fall risk Follow up: Falls evaluation completed; Education provided  Home and Transportation Safety: All rugs have non-skid backing?: N/A, no rugs All stairs or steps have railings?: N/A, no stairs Grab bars in the bathtub or shower?: yes Have non-skid surface in bathtub or shower?: yes Good home lighting?: yes Regular seat belt use?: yes Hospital stays in the last year:: (!) yes How many hospital stays:: 1 Reason: HYPERKALEMIA  Cognitive Assessment Difficulty concentrating, remembering, or making decisions? : no Will 6CIT or Mini Cog be Completed: yes What year is it?: 0 points What month is it?: 0 points Give patient an address phrase to remember (5 components): [place] [address] About what time is it?: 0 points Count backwards from 20 to 1: 0 points Say the months of the year in reverse: 0 points Repeat the address phrase from earlier: 0 points 6 CIT Score: 0 points  Advance Directives (For Healthcare) Does Patient Have a Medical Advance Directive?: No Would patient like information on creating a medical advance directive?: No - Patient declined  Reviewed/Updated  Reviewed/Updated: Reviewed All (Medical, Surgical, Family, Medications, Allergies, Care Teams, Patient Goals)    Allergies (verified) Shrimp extract   Current Medications  (verified) Outpatient Encounter Medications as of 11/15/2024  Medication Sig   amLODipine  (NORVASC ) 10 MG tablet Take 1  tablet (10 mg total) by mouth daily.   aspirin  EC 81 MG tablet Take 1 tablet (81 mg total) by mouth daily. Swallow whole.   atorvastatin  (LIPITOR) 40 MG tablet TAKE 1 TABLET (40 MG TOTAL) BY MOUTH DAILY.   B Complex-C-Folic Acid  (DIALYVITE  800) 0.8 MG TABS Take 1 tablet by mouth daily.   Blood Pressure Monitoring (ADULT BLOOD PRESSURE CUFF LG) KIT 1 each by Does not apply route daily.   carvedilol  (COREG ) 6.25 MG tablet Take 6.25 mg by mouth 2 (two) times daily with a meal.   EPINEPHrine  0.3 mg/0.3 mL IJ SOAJ injection Inject 0.3 mg into the muscle as needed for anaphylaxis.   ferric citrate  (AURYXIA ) 1 GM 210 MG(Fe) tablet    gabapentin  (NEURONTIN ) 100 MG capsule TAKE 2 CAPSULES (200 MG TOTAL) BY MOUTH 3 (THREE) TIMES DAILY.   glucose blood (ACCU-CHEK GUIDE) test strip User to check blood sugar up to 4x per day e11.9   hydrALAZINE  (APRESOLINE ) 10 MG tablet Take 1 tablet (10 mg total) by mouth 3 (three) times daily.   LOPERAMIDE HCL PO Take by mouth as needed.   omeprazole  (PRILOSEC) 40 MG capsule TAKE 1 CAPSULE (40 MG TOTAL) BY MOUTH DAILY.   prednisoLONE acetate (PRED FORTE) 1 % ophthalmic suspension SMARTSIG:1 Drop(s) Left Eye Daily PRN   sevelamer  carbonate (RENVELA ) 800 MG tablet Take 800-1,600 mg by mouth 3 (three) times daily with meals. Take 1,600 mg by mouth three times a day with meals and 800 mg with snacks   TYLENOL  500 MG tablet Take 500-1,000 mg by mouth every 6 (six) hours as needed for mild pain (pain score 1-3) or headache.   Zoster Vaccine Adjuvanted (SHINGRIX ) injection Administer Shingrix  vaccination now and repeat in two months   No facility-administered encounter medications on file as of 11/15/2024.    History: Past Medical History:  Diagnosis Date   Alcohol dependency (HCC)    Anemia    Appendicitis 12/06/2018   Chronic kidney disease     Diabetes mellitus    type 2   Diabetes mellitus type 2, uncontrolled    End stage renal disease (HCC) 05/05/2016   Starting 04/2016, LUE fistula   Enterococcal bacteremia    Hypertension    Legally blind in left eye, as defined in [place]     Liver abscess 12/20/2018   Necrotizing fasciitis (HCC)    PONV (postoperative nausea and vomiting)    Pulmonary HTN (HCC)    mild with PASP on echo 07/2024   Sepsis without acute organ dysfunction (HCC)    Type 2 diabetes mellitus with diabetic chronic kidney disease (HCC) 05/05/2016   Past Surgical History:  Procedure Laterality Date   A/V FISTULAGRAM N/A 02/09/2024   Procedure: A/V Fistulagram;  Surgeon: Melia Lynwood ORN, MD;  Location: [facility];  Service: Cardiovascular;  Laterality: N/A;   A/V FISTULAGRAM Left 10/11/2024   Procedure: A/V Fistulagram;  Surgeon: Sheree Penne Bruckner, MD;  Location: [facility];  Service: Cardiovascular;  Laterality: Left;   AMPUTATION     right first and left middle finger amputation 2/2 OM 08/2011 by Dr. Camella   AMPUTATION  01/18/2012   Procedure: AMPUTATION DIGIT;  Surgeon: Franky JONELLE Curia, MD;  Location: [facility];  Service: Orthopedics;  Laterality: Left;  revision amputation left thumb   AMPUTATION  08/25/2012   Procedure: AMPUTATION BELOW KNEE;  Surgeon: Jerona LULLA Sage, MD;  Location: [facility];  Service: Orthopedics;  Laterality: Right;  Right Below Knee  Amputation   APPENDECTOMY     BASCILIC VEIN TRANSPOSITION Left 07/22/2015   Procedure: BASILIC VEIN TRANSPOSITION ;  Surgeon: Carlin FORBES Haddock, MD;  Location: [facility];  Service: Vascular;  Laterality: Left;   CATARACT EXTRACTION     right    CESAREAN SECTION  1986   CESAREAN SECTION  1985   CESAREAN SECTION  1991   COLONOSCOPY WITH PROPOFOL  N/A 08/25/2023   Procedure: COLONOSCOPY WITH PROPOFOL ;  Surgeon: Aneita Gwendlyn DASEN, MD;  Location: [facility];  Service: Gastroenterology;  Laterality: N/A;   I & D EXTREMITY  12/24/2011    Procedure: IRRIGATION AND DEBRIDEMENT EXTREMITY;  Surgeon: Franky JONELLE Curia, MD;  Location: [facility];  Service: Orthopedics;  Laterality: Left;  I&Dleft thumb with partial amputation   I & D EXTREMITY  12/26/2011   Procedure: IRRIGATION AND DEBRIDEMENT EXTREMITY;  Surgeon: Franky JONELLE Curia, MD;  Location: [facility];  Service: Orthopedics;  Laterality: Left;   I & D EXTREMITY  12/23/2011   Procedure: IRRIGATION AND DEBRIDEMENT EXTREMITY;  Surgeon: Franky JONELLE Curia, MD;  Location: [facility];  Service: Orthopedics;  Laterality: Left;  I&D Left thumb, hand and arm   I&D left thumb  12/23/2011   IR RADIOLOGIST EVAL & MGMT  01/04/2019   IR RADIOLOGIST EVAL & MGMT  01/25/2019   LAPAROSCOPIC APPENDECTOMY N/A 12/06/2018   Procedure: APPENDECTOMY LAPAROSCOPIC;  Surgeon: Kinsinger, Herlene Righter, MD;  Location: [facility];  Service: General;  Laterality: N/A;   LEFT HEART CATH AND CORONARY ANGIOGRAPHY N/A 10/26/2024   Procedure: LEFT HEART CATH AND CORONARY ANGIOGRAPHY;  Surgeon: Verlin Lonni BIRCH, MD;  Location: [facility];  Service: Cardiovascular;  Laterality: N/A;   POLYPECTOMY  08/25/2023   Procedure: POLYPECTOMY;  Surgeon: Aneita Gwendlyn DASEN, MD;  Location: [facility];  Service: Gastroenterology;;   TUBAL LIGATION     Family History  Problem Relation Age of Onset   Diabetes Mother    Alcohol abuse Mother    Diabetes Father    Alzheimer's disease Father    Hypertension Father    Kidney failure Sister    Diabetes Sister    High blood pressure Sister    Social History   Occupational History    Employer: [employer]  Tobacco Use   Smoking status: Never    Passive exposure: Never   Smokeless tobacco: Never  Vaping Use   Vaping status: Never Used  Substance and Sexual Activity   Alcohol use: Yes    Comment: h/o alcohol abuse 4 years ago (07/21/15)   Drug use: Yes    Types: Cocaine    Comment:  last used cocaine 4 years ago (07/21/15)   Sexual activity: Yes    Birth control/protection: Surgical   Tobacco  Counseling Counseling given: Not Answered  SDOH Screenings   Food Insecurity: No Food Insecurity (11/15/2024)  Housing: Low Risk (11/15/2024)  Transportation Needs: No Transportation Needs (11/15/2024)  Utilities: Not At Risk (11/15/2024)  Alcohol Screen: Low Risk (09/27/2023)  Depression (PHQ2-9): Low Risk (11/15/2024)  Financial Resource Strain: Low Risk (08/21/2024)  Physical Activity: Inactive (11/15/2024)  Social Connections: Unknown (11/15/2024)  Stress: No Stress Concern Present (11/15/2024)  Tobacco Use: Low Risk (11/15/2024)  Health Literacy: Adequate Health Literacy (11/15/2024)   See flowsheets for full screening details  Depression Screen Depression Screening Exception Documentation Depression Screening Exception:: Patient refusal  PHQ 2 & 9 Depression Scale- Over the past 2 weeks, how often have you been bothered by any of the following problems? Little interest  or pleasure in doing things: 0 Feeling down, depressed, or hopeless (PHQ Adolescent also includes...irritable): 0 PHQ-2 Total Score: 0 Trouble falling or staying asleep, or sleeping too much: 0 (VERY RESTFUL) Feeling tired or having little energy: 0 Poor appetite or overeating (PHQ Adolescent also includes...weight loss): 0 Feeling bad about yourself - or that you are a failure or have let yourself or your family down: 0 Trouble concentrating on things, such as reading the newspaper or watching television (PHQ Adolescent also includes...like school work): 0 Moving or speaking so slowly that other people could have noticed. Or the opposite - being so fidgety or restless that you have been moving around a lot more than usual: 0 Thoughts that you would be better off dead, or of hurting yourself in some way: 0 PHQ-9 Total Score: 0 If you checked off any problems, how difficult have these problems made it for you to do your work, take care of things at home, or get along with other people?: Not difficult at  all  Depression Treatment Depression Interventions/Treatment : EYV7-0 Score <4 Follow-up Not Indicated     Goals Addressed             This Visit's Progress    11/15/2024: My goal is to get a prosthetic leg so that I will be able to walk again and not have to use a wheelchair.               Objective:    Today's Vitals   11/15/24 1156  Weight: 179 lb (81.2 kg)  Height: 5' 7 (1.702 m)  PainSc: 0-No pain   Body mass index is 28.04 kg/m.  Hearing/Vision screen Hearing Screening - Comments:: Patient has adequate hearing.  No hearing aids needed. Vision Screening - Comments:: Patient is legally blind in left eye. Eye exam completed by: Jeffie Blanch, MD. Immunizations and Health Maintenance Health Maintenance  Topic Date Due   Zoster Vaccines- Shingrix  (1 of 2) Never done   OPHTHALMOLOGY EXAM  10/20/2019   COVID-19 Vaccine (4 - 2025-26 season) 08/06/2024   LIPID PANEL  11/21/2024   FOOT EXAM  01/16/2025   Mammogram  11/09/2025   Medicare Annual Wellness (AWV)  11/15/2025   Pneumococcal Vaccine: 50+ Years (4 of 4 - PCV20 or PCV21) 11/19/2025   Cervical Cancer Screening (HPV/Pap Cotest)  01/19/2027   DTaP/Tdap/Td (2 - Td or Tdap) 09/09/2027   Colonoscopy  08/24/2033   Influenza Vaccine  Completed   Hepatitis B Vaccines 19-59 Average Risk  Completed   Hepatitis C Screening  Completed   HIV Screening  Completed   HPV VACCINES  Aged Out   Meningococcal B Vaccine  Aged Out        Assessment/Plan:  This is a routine wellness examination for [name].  Patient Care Team: Romelle Booty, MD as PCP - General (Family Medicine) Shlomo Wilbert SAUNDERS, MD as PCP - Cardiology (Cardiology) Marlee Bernardino NOVAK, MD as Attending Physician (Nephrology) Blanch Baptist, MD as Consulting Physician (Ophthalmology) Macel Jayson PARAS, MD as Consulting Physician (Nephrology)  I have personally reviewed and noted the following in the patients chart:   Medical and social history Use of  alcohol, tobacco or illicit drugs  Current medications and supplements including opioid prescriptions. Functional ability and status Nutritional status Physical activity Advanced directives List of other physicians Hospitalizations, surgeries, and ER visits in previous 12 months Vitals Screenings to include cognitive, depression, and falls Referrals and appointments  No orders of the defined types were placed  in this encounter.  In addition, I have reviewed and discussed with patient certain preventive protocols, quality metrics, and best practice recommendations. A written personalized care plan for preventive services as well as general preventive health recommendations were provided to patient.   Roz LOISE Fuller, LPN   [id number]   Return in about 1 year (around 11/15/2025) for Medicare wellness.  After Visit Summary: (Declined) Due to this being a telephonic visit, with patients personalized plan was offered to patient but patient Declined AVS at this time   Nurse Notes:  HM Addressed: Vaccines Due: Shingrix  and Covid-19 vaccines. Patient completed eye exam today, nurse will request medical records from Dr. Tobie.

## 2024-11-16 LAB — OPHTHALMOLOGY REPORT-SCANNED

## 2024-11-16 NOTE — Progress Notes (Signed)
 Reviewed and agree with Dr Rennis plan.

## 2024-11-23 ENCOUNTER — Encounter: Payer: Self-pay | Admitting: Cardiology

## 2024-12-18 ENCOUNTER — Ambulatory Visit: Attending: Nurse Practitioner | Admitting: Nurse Practitioner

## 2024-12-18 NOTE — Progress Notes (Unsigned)
 "  Office Visit    Patient Name: [name] Date of Encounter: 12/18/2024  Primary Care Provider:  Romelle Booty, MD Primary Cardiologist:  Wilbert Bihari, MD  Chief Complaint    59 year old female with a history of CAD, hypertension, hyperlipidemia, type 2 diabetes, ESRD on HD, prior alcohol dependency, and blindness in left eye.  Past Medical History    Past Medical History:  Diagnosis Date   Alcohol dependency (HCC)    Anemia    Appendicitis 12/06/2018   Chronic kidney disease    Diabetes mellitus    type 2   Diabetes mellitus type 2, uncontrolled    End stage renal disease (HCC) 05/05/2016   Starting 04/2016, LUE fistula   Enterococcal bacteremia    Hypertension    Legally blind in left eye, as defined in [place]     Liver abscess 12/20/2018   Necrotizing fasciitis (HCC)    PONV (postoperative nausea and vomiting)    Pulmonary HTN (HCC)    mild with PASP on echo 07/2024   Sepsis without acute organ dysfunction (HCC)    Type 2 diabetes mellitus with diabetic chronic kidney disease (HCC) 05/05/2016   Past Surgical History:  Procedure Laterality Date   A/V FISTULAGRAM N/A 02/09/2024   Procedure: A/V Fistulagram;  Surgeon: Melia Lynwood ORN, MD;  Location: [facility];  Service: Cardiovascular;  Laterality: N/A;   A/V FISTULAGRAM Left 10/11/2024   Procedure: A/V Fistulagram;  Surgeon: Sheree Penne Bruckner, MD;  Location: [facility];  Service: Cardiovascular;  Laterality: Left;   AMPUTATION     right first and left middle finger amputation 2/2 OM 08/2011 by Dr. Camella   AMPUTATION  01/18/2012   Procedure: AMPUTATION DIGIT;  Surgeon: Franky JONELLE Curia, MD;  Location: [facility];  Service: Orthopedics;  Laterality: Left;  revision amputation left thumb   AMPUTATION  08/25/2012   Procedure: AMPUTATION BELOW KNEE;  Surgeon: Jerona LULLA Sage, MD;  Location: [facility];  Service: Orthopedics;  Laterality: Right;  Right Below Knee Amputation   APPENDECTOMY      BASCILIC VEIN TRANSPOSITION Left 07/22/2015   Procedure: BASILIC VEIN TRANSPOSITION ;  Surgeon: Carlin FORBES Haddock, MD;  Location: [facility];  Service: Vascular;  Laterality: Left;   CATARACT EXTRACTION     right    CESAREAN SECTION  1986   CESAREAN SECTION  1985   CESAREAN SECTION  1991   COLONOSCOPY WITH PROPOFOL  N/A 08/25/2023   Procedure: COLONOSCOPY WITH PROPOFOL ;  Surgeon: Aneita Gwendlyn DASEN, MD;  Location: [facility];  Service: Gastroenterology;  Laterality: N/A;   I & D EXTREMITY  12/24/2011   Procedure: IRRIGATION AND DEBRIDEMENT EXTREMITY;  Surgeon: Franky JONELLE Curia, MD;  Location: [facility];  Service: Orthopedics;  Laterality: Left;  I&Dleft thumb with partial amputation   I & D EXTREMITY  12/26/2011   Procedure: IRRIGATION AND DEBRIDEMENT EXTREMITY;  Surgeon: Franky JONELLE Curia, MD;  Location: [facility];  Service: Orthopedics;  Laterality: Left;   I & D EXTREMITY  12/23/2011   Procedure: IRRIGATION AND DEBRIDEMENT EXTREMITY;  Surgeon: Franky JONELLE Curia, MD;  Location: [facility];  Service: Orthopedics;  Laterality: Left;  I&D Left thumb, hand and arm   I&D left thumb  12/23/2011   IR RADIOLOGIST EVAL & MGMT  01/04/2019   IR RADIOLOGIST EVAL & MGMT  01/25/2019   LAPAROSCOPIC APPENDECTOMY N/A 12/06/2018   Procedure: APPENDECTOMY LAPAROSCOPIC;  Surgeon: Stevie Herlene Righter, MD;  Location: [facility];  Service: General;  Laterality: N/A;   LEFT HEART CATH AND CORONARY ANGIOGRAPHY N/A 10/26/2024   Procedure: LEFT HEART CATH AND CORONARY ANGIOGRAPHY;  Surgeon: Verlin Lonni BIRCH, MD;  Location: [facility];  Service: Cardiovascular;  Laterality: N/A;   POLYPECTOMY  08/25/2023   Procedure: POLYPECTOMY;  Surgeon: Aneita Gwendlyn DASEN, MD;  Location: [facility];  Service: Gastroenterology;;   TUBAL LIGATION      Allergies  Allergies[1]   Labs/Other Studies Reviewed    The following studies were reviewed today:  Cardiac Studies & Procedures    ______________________________________________________________________________________________ CARDIAC CATHETERIZATION  CARDIAC CATHETERIZATION 10/26/2024  Conclusion No angiographic evidence of CAD LVEDP 18 mmHg  No further ischemia workup  Findings Coronary Findings Diagnostic  Dominance: Right  Left Anterior Descending Vessel is large.  Left Circumflex Vessel is large.  Right Coronary Artery Vessel is large.  Intervention  No interventions have been documented.   STRESS TESTS  NM PET CT CARDIAC PERFUSION MULTI W/ABSOLUTE BLOODFLOW 10/02/2024  Narrative   Findings are consistent with LAD ischemia  Technically challenging study; though flow reserve with a perfusion defect is a high risk feature, this can be less accurate in the ESRD population.  Ultimately, with decreased LVEF and the above, the study is high risk.   LV perfusion is abnormal. Defect 1: There is a medium defect with moderate reduction in uptake present in the apical to mid anterior location(s) that is reversible. There is normal wall motion in the defect area. Consistent with ischemia.   Rest left ventricular function is abnormal. Rest EF: 43%. Stress left ventricular function is normal. Stress EF: 53%. End diastolic cavity size is mildly enlarged. End systolic cavity size is normal.   Myocardial blood flow was computed to be 1.59ml/g/min at rest and 1.29ml/g/min at stress. Global myocardial blood flow reserve was 1.21 and was highly abnormal.   Coronary calcium  was present on the attenuation correction CT images. Moderate coronary calcifications were present. Coronary calcifications were present in the left anterior descending artery, left circumflex artery and right coronary artery distribution(s). Aortic atherosclerosis.  Dilation of the pulmonary artery on non contrast imaging.   Electronically Signed  By: Stanly Leavens M.D.  CLINICAL DATA:  This over-read does not include interpretation  of cardiac or coronary anatomy or pathology. No interpretation the PET data set. The cardiac PET-CT interpretation by the cardiologist is attached.  COMPARISON:  None Available.  FINDINGS: Limited view of the lung parenchyma demonstrates no suspicious nodularity. Airways are normal.  Limited view of the mediastinum demonstrates no adenopathy. Esophagus normal.  Limited view of the upper abdomen unremarkable.  Limited view of the skeleton and chest wall is unremarkable.  IMPRESSION: No significant extracardiac findings.   Electronically Signed By: Jackquline Boxer M.D. On: 10/02/2024 16:45   ECHOCARDIOGRAM  ECHOCARDIOGRAM COMPLETE 08/02/2024  Narrative ECHOCARDIOGRAM REPORT    Patient Name:   TAHIRAH SARA Date of Exam: 08/02/2024 Medical Rec #:  [id number]       Height:       67.0 in Accession #:    [id number]      Weight:       179.8 lb Date of Birth:  25-Nov-1966      BSA:          1.933 m Patient Age:    57 years        BP:           186/89 mmHg Patient Gender: F  HR:           76 bpm. Exam Location:  [address]  Procedure: 2D Echo, 3D Echo and Strain Analysis (Both Spectral and Color Flow Doppler were utilized during procedure).  Indications:    R07.9* Chest pain, unspecified  History:        Patient has prior history of Echocardiogram examinations, most recent 01/08/2012. Risk Factors:Hypertension, Diabetes and Dyslipidemia. ESRD. Legally blind. Dizziness.  Sonographer:    Jon Hacker RCS Referring Phys: [phone] TRACI R TURNER  IMPRESSIONS   1. Left ventricular ejection fraction, by estimation, is 60 to 65%. Left ventricular ejection fraction by 3D volume is 62 %. The left ventricle has normal function. The left ventricle has no regional wall motion abnormalities. Left ventricular diastolic parameters are indeterminate. Elevated left atrial pressure. The average left ventricular global longitudinal strain is -19.4 %. The global longitudinal  strain is normal. 2. Right ventricular systolic function is normal. The right ventricular size is normal. There is mildly elevated pulmonary artery systolic pressure. The estimated right ventricular systolic pressure is 43.2 mmHg. 3. The mitral valve is normal in structure. Trivial mitral valve regurgitation. No evidence of mitral stenosis. 4. The aortic valve is tricuspid. Aortic valve regurgitation is not visualized. Aortic valve sclerosis/calcification is present, without any evidence of aortic stenosis. 5. The inferior vena cava is normal in size with greater than 50% respiratory variability, suggesting right atrial pressure of 3 mmHg.  FINDINGS Left Ventricle: Left ventricular ejection fraction, by estimation, is 60 to 65%. Left ventricular ejection fraction by 3D volume is 62 %. The left ventricle has normal function. The left ventricle has no regional wall motion abnormalities. The average left ventricular global longitudinal strain is -19.4 %. Strain was performed and the global longitudinal strain is normal. The left ventricular internal cavity size was normal in size. There is no left ventricular hypertrophy. Left ventricular diastolic parameters are indeterminate. Elevated left atrial pressure.  Right Ventricle: The right ventricular size is normal. No increase in right ventricular wall thickness. Right ventricular systolic function is normal. There is mildly elevated pulmonary artery systolic pressure. The tricuspid regurgitant velocity is 3.17 m/s, and with an assumed right atrial pressure of 3 mmHg, the estimated right ventricular systolic pressure is 43.2 mmHg.  Left Atrium: Left atrial size was normal in size.  Right Atrium: Right atrial size was normal in size.  Pericardium: There is no evidence of pericardial effusion.  Mitral Valve: The mitral valve is normal in structure. Trivial mitral valve regurgitation. No evidence of mitral valve stenosis.  Tricuspid Valve: The  tricuspid valve is normal in structure. Tricuspid valve regurgitation is mild . No evidence of tricuspid stenosis.  Aortic Valve: The aortic valve is tricuspid. Aortic valve regurgitation is not visualized. Aortic valve sclerosis/calcification is present, without any evidence of aortic stenosis.  Pulmonic Valve: The pulmonic valve was normal in structure. Pulmonic valve regurgitation is trivial. No evidence of pulmonic stenosis.  Aorta: The aortic root is normal in size and structure.  Venous: The inferior vena cava is normal in size with greater than 50% respiratory variability, suggesting right atrial pressure of 3 mmHg.  IAS/Shunts: No atrial level shunt detected by color flow Doppler.  Additional Comments: 3D was performed not requiring image post processing on an independent workstation and was normal.   LEFT VENTRICLE PLAX 2D LVIDd:         4.79 cm         Diastology LVIDs:  3.38 cm         LV e' medial:    5.55 cm/s LV PW:         1.11 cm         LV E/e' medial:  20.7 LV IVS:        0.96 cm         LV e' lateral:   11.40 cm/s LVOT diam:     2.00 cm         LV E/e' lateral: 10.1 LV SV:         83 LV SV Index:   43              2D Longitudinal LVOT Area:     3.14 cm        Strain 2D Strain GLS   -22.4 % (A4C): 2D Strain GLS   -18.0 % (A3C): 2D Strain GLS   -17.7 % (A2C): 2D Strain GLS   -19.4 % Avg:  3D Volume EF LV 3D EF:    Left ventricul ar ejection fraction by 3D volume is 62 %.  3D Volume EF: 3D EF:        62 % LV EDV:       202 ml LV ESV:       78 ml LV SV:        125 ml  RIGHT VENTRICLE RV Basal diam:  3.14 cm RV S prime:     10.30 cm/s TAPSE (M-mode): 2.4 cm RVSP:           43.2 mmHg  LEFT ATRIUM             Index        RIGHT ATRIUM           Index LA diam:        3.20 cm 1.66 cm/m   RA Pressure: 3.00 mmHg LA Vol (A2C):   50.9 ml 26.34 ml/m  RA Area:     18.00 cm LA Vol (A4C):   44.4 ml 22.97 ml/m  RA Volume:   49.80 ml  25.77  ml/m LA Biplane Vol: 50.9 ml 26.34 ml/m AORTIC VALVE LVOT Vmax:   96.60 cm/s LVOT Vmean:  65.400 cm/s LVOT VTI:    0.264 m  AORTA Ao Root diam: 2.60 cm Ao Asc diam:  3.20 cm  MITRAL VALVE                TRICUSPID VALVE MV Area (PHT): 4.83 cm     TR Peak grad:   40.2 mmHg MV Decel Time: 157 msec     TR Vmax:        317.00 cm/s MV E velocity: 115.00 cm/s  Estimated RAP:  3.00 mmHg MV A velocity: 107.00 cm/s  RVSP:           43.2 mmHg MV E/A ratio:  1.07 SHUNTS Systemic VTI:  0.26 m Systemic Diam: 2.00 cm  Wilbert Bihari MD Electronically signed by Wilbert Bihari MD Signature Date/Time: 08/02/2024/4:21:46 PM    Final          ______________________________________________________________________________________________     Recent Labs: 10/18/2024: ALT 6 10/26/2024: BUN 33; Creatinine, Ser 6.42; Hemoglobin 12.0; Platelets 182; Potassium 4.9; Sodium 137  Recent Lipid Panel    Component Value Date/Time   CHOL 106 11/22/2023 1453   TRIG 64 11/22/2023 1453   HDL 51 11/22/2023 1453   CHOLHDL 2.1 11/22/2023 1453   CHOLHDL 3.2 08/25/2012 0530   VLDL 33  08/25/2012 0530   LDLCALC 41 11/22/2023 1453    History of Present Illness    59 year old female with the above past medical history including CAD, hypertension, hyperlipidemia, type 2 diabetes, ESRD on HD, prior alcohol dependency, and blindness in left eye.   She was referred to cardiology in the setting of chest discomfort.  Echocardiogram in 07/2024 showed EF 65%, normal LV function, no RWMA, normal RV systolic function, mildly elevated PASP, no significant valvular abnormalities. Cardiac PET stress in 09/2024 showed medium defect with moderate reduction in uptake present in the apical to mid anterior locations, reversible, consistent with ischemia, EF 43% moderate coronary calcification in the LAD, LCx, RCA.  Follow-up cardiac catheterization was recommended per Dr. Shlomo   She was last seen in office on 10/18/2024 and  was stable from a cardiac standpoint.  She denies symptoms concerning for angina.  Cardiac catheterization on 10/26/2024 revealed no significant CAD.  She was noted to be hyperkalemic.  Nephrology was consulted.  She underwent dialysis.  Potassium was 4.9 on discharge.   She presents today for follow-up. Since her last visit she has  1. CAD: Cardiac PET stress in 09/2024 showed medium defect with moderate reduction in uptake present in the apical to mid anterior locations, reversible, consistent with ischemia, EF 43% moderate coronary calcification in the LAD, LCx, RCA.  Stable with no anginal symptoms.  Once again reviewed recommendations for LHC with Dr. Shlomo, who recommends proceeding with LHC given abnormal stress test.  Patient is agreeable to proceed.  Will check CBC, CMET today.  LHC scheduled for 10/25/2024 with Dr. Mady. Will notify her nephrologist, Dr. Dalene, as well as [facility] of plans for upcoming cath.  Of note, she is no longer on insulin .  Continue aspirin , carvedilol , hydralazine , amlodipine -losartan , Lipitor.    2. Hypertension: BP well controlled. Continue current antihypertensive regimen.    3. Hyperlipidemia: LDL was 41 in 11/2023.  She will ultimately require updated fasting lipids, LP(a), will defer she is not fasting at this time. Continue Lipitor.   4. Type 2 diabetes: A1c was 5.8 in 08/2024.  Monitored and managed per PCP.   5. ESRD on HD: On HD MWF, Followed by nephrology.    6. Disposition: Follow-up   Home Medications    Current Outpatient Medications  Medication Sig Dispense Refill   amLODipine  (NORVASC ) 10 MG tablet Take 1 tablet (10 mg total) by mouth daily. 90 tablet 1   aspirin  EC 81 MG tablet Take 1 tablet (81 mg total) by mouth daily. Swallow whole.     atorvastatin  (LIPITOR) 40 MG tablet TAKE 1 TABLET (40 MG TOTAL) BY MOUTH DAILY. 90 tablet 0   B Complex-C-Folic Acid  (DIALYVITE  800) 0.8 MG TABS Take 1 tablet by mouth daily.  11    Blood Pressure Monitoring (ADULT BLOOD PRESSURE CUFF LG) KIT 1 each by Does not apply route daily. 1 kit 0   carvedilol  (COREG ) 6.25 MG tablet Take 6.25 mg by mouth 2 (two) times daily with a meal.     EPINEPHrine  0.3 mg/0.3 mL IJ SOAJ injection Inject 0.3 mg into the muscle as needed for anaphylaxis. 1 each 0   ferric citrate  (AURYXIA ) 1 GM 210 MG(Fe) tablet      gabapentin  (NEURONTIN ) 100 MG capsule TAKE 2 CAPSULES (200 MG TOTAL) BY MOUTH 3 (THREE) TIMES DAILY. 540 capsule 0   glucose blood (ACCU-CHEK GUIDE) test strip User to check blood sugar up to 4x per day e11.9 100 each 12  hydrALAZINE  (APRESOLINE ) 10 MG tablet Take 1 tablet (10 mg total) by mouth 3 (three) times daily. 270 tablet 0   LOPERAMIDE HCL PO Take by mouth as needed.     omeprazole  (PRILOSEC) 40 MG capsule TAKE 1 CAPSULE (40 MG TOTAL) BY MOUTH DAILY. 90 capsule 0   prednisoLONE acetate (PRED FORTE) 1 % ophthalmic suspension SMARTSIG:1 Drop(s) Left Eye Daily PRN     sevelamer  carbonate (RENVELA ) 800 MG tablet Take 800-1,600 mg by mouth 3 (three) times daily with meals. Take 1,600 mg by mouth three times a day with meals and 800 mg with snacks     TYLENOL  500 MG tablet Take 500-1,000 mg by mouth every 6 (six) hours as needed for mild pain (pain score 1-3) or headache.     Zoster Vaccine Adjuvanted (SHINGRIX ) injection Administer Shingrix  vaccination now and repeat in two months 1 each 1   No current facility-administered medications for this visit.     Review of Systems    ***.  All other systems reviewed and are otherwise negative except as noted above.    Physical Exam    VS:  LMP 03/07/2019  , BMI There is no height or weight on file to calculate BMI. STOP-Bang Score:  3  { Consider Dx Sleep Disordered Breathing or Sleep Apnea  ICD G47.33          :1}    GEN: Well nourished, well developed, in no acute distress. HEENT: normal. Neck: Supple, no JVD, carotid bruits, or masses. Cardiac: RRR, no murmurs, rubs, or  gallops. No clubbing, cyanosis, edema.  Radials/DP/PT 2+ and equal bilaterally.  Respiratory:  Respirations regular and unlabored, clear to auscultation bilaterally. GI: Soft, nontender, nondistended, BS + x 4. MS: no deformity or atrophy. Skin: warm and dry, no rash. Neuro:  Strength and sensation are intact. Psych: Normal affect.  Accessory Clinical Findings    ECG personally reviewed by me today -    - no acute changes.   Lab Results  Component Value Date   WBC 4.1 10/26/2024   HGB 12.0 10/26/2024   HCT 40.1 10/26/2024   MCV 73.4 (L) 10/26/2024   PLT 182 10/26/2024   Lab Results  Component Value Date   CREATININE 6.42 (H) 10/26/2024   BUN 33 (H) 10/26/2024   NA 137 10/26/2024   K 4.9 10/26/2024   CL 92 (L) 10/26/2024   CO2 28 10/26/2024   Lab Results  Component Value Date   ALT 6 10/18/2024   AST 17 10/18/2024   ALKPHOS 84 10/18/2024   BILITOT 0.4 10/18/2024   Lab Results  Component Value Date   CHOL 106 11/22/2023   HDL 51 11/22/2023   LDLCALC 41 11/22/2023   TRIG 64 11/22/2023   CHOLHDL 2.1 11/22/2023    Lab Results  Component Value Date   HGBA1C 5.8 08/28/2024    Assessment & Plan    1.  ***  No BP recorded.  {Refresh Note OR Click here to enter BP  :1}***   Damien JAYSON Braver, NP 12/18/2024, 6:29 AM       [1]  Allergies Allergen Reactions   Shrimp Extract Nausea And Vomiting, Swelling and Other (See Comments)    Patient ended up in the ED after eating this- left eye is puffy   "

## 2025-01-01 ENCOUNTER — Other Ambulatory Visit: Payer: Self-pay | Admitting: Family Medicine

## 2025-02-05 ENCOUNTER — Ambulatory Visit: Admitting: Nurse Practitioner
